# Patient Record
Sex: Male | Born: 1955 | Race: White | Hispanic: No | Smoking: Former smoker
Health system: Southern US, Community
[De-identification: ages and names within clinical notes are randomized; demographics above are authoritative.]

## PROBLEM LIST (undated history)

## (undated) DIAGNOSIS — Z973 Presence of spectacles and contact lenses: Secondary | ICD-10-CM

## (undated) DIAGNOSIS — K7200 Acute and subacute hepatic failure without coma: Secondary | ICD-10-CM

## (undated) DIAGNOSIS — R509 Fever, unspecified: Secondary | ICD-10-CM

## (undated) DIAGNOSIS — M1990 Unspecified osteoarthritis, unspecified site: Secondary | ICD-10-CM

## (undated) DIAGNOSIS — R680 Hypothermia, not associated with low environmental temperature: Secondary | ICD-10-CM

## (undated) DIAGNOSIS — C329 Malignant neoplasm of larynx, unspecified: Secondary | ICD-10-CM

## (undated) DIAGNOSIS — I10 Essential (primary) hypertension: Secondary | ICD-10-CM

## (undated) DIAGNOSIS — G9340 Encephalopathy, unspecified: Secondary | ICD-10-CM

## (undated) DIAGNOSIS — E785 Hyperlipidemia, unspecified: Secondary | ICD-10-CM

## (undated) DIAGNOSIS — I771 Stricture of artery: Secondary | ICD-10-CM

## (undated) DIAGNOSIS — I219 Acute myocardial infarction, unspecified: Secondary | ICD-10-CM

## (undated) DIAGNOSIS — R011 Cardiac murmur, unspecified: Secondary | ICD-10-CM

## (undated) DIAGNOSIS — K222 Esophageal obstruction: Secondary | ICD-10-CM

## (undated) DIAGNOSIS — I6529 Occlusion and stenosis of unspecified carotid artery: Secondary | ICD-10-CM

## (undated) DIAGNOSIS — I739 Peripheral vascular disease, unspecified: Secondary | ICD-10-CM

## (undated) DIAGNOSIS — Z720 Tobacco use: Secondary | ICD-10-CM

## (undated) DIAGNOSIS — I255 Ischemic cardiomyopathy: Secondary | ICD-10-CM

## (undated) DIAGNOSIS — F329 Major depressive disorder, single episode, unspecified: Secondary | ICD-10-CM

## (undated) DIAGNOSIS — K432 Incisional hernia without obstruction or gangrene: Secondary | ICD-10-CM

## (undated) DIAGNOSIS — R42 Dizziness and giddiness: Secondary | ICD-10-CM

## (undated) DIAGNOSIS — Z923 Personal history of irradiation: Secondary | ICD-10-CM

## (undated) DIAGNOSIS — I2510 Atherosclerotic heart disease of native coronary artery without angina pectoris: Secondary | ICD-10-CM

## (undated) DIAGNOSIS — I5043 Acute on chronic combined systolic (congestive) and diastolic (congestive) heart failure: Secondary | ICD-10-CM

## (undated) DIAGNOSIS — R1310 Dysphagia, unspecified: Secondary | ICD-10-CM

## (undated) DIAGNOSIS — IMO0001 Reserved for inherently not codable concepts without codable children: Secondary | ICD-10-CM

## (undated) DIAGNOSIS — R531 Weakness: Secondary | ICD-10-CM

## (undated) DIAGNOSIS — Z9189 Other specified personal risk factors, not elsewhere classified: Secondary | ICD-10-CM

## (undated) DIAGNOSIS — F32A Depression, unspecified: Secondary | ICD-10-CM

## (undated) DIAGNOSIS — F419 Anxiety disorder, unspecified: Secondary | ICD-10-CM

## (undated) DIAGNOSIS — K219 Gastro-esophageal reflux disease without esophagitis: Secondary | ICD-10-CM

## (undated) HISTORY — DX: Peripheral vascular disease, unspecified: I73.9

## (undated) HISTORY — DX: Personal history of irradiation: Z92.3

## (undated) HISTORY — PX: CARDIAC CATHETERIZATION: SHX172

## (undated) HISTORY — PX: COLONOSCOPY: SHX174

## (undated) HISTORY — DX: Malignant neoplasm of larynx, unspecified: C32.9

## (undated) HISTORY — PX: COLOSTOMY: SHX63

## (undated) HISTORY — DX: Tobacco use: Z72.0

## (undated) HISTORY — DX: Acute on chronic combined systolic (congestive) and diastolic (congestive) heart failure: I50.43

## (undated) HISTORY — PX: COLON SURGERY: SHX602

## (undated) HISTORY — PX: PEG TUBE REMOVAL: SHX2187

## (undated) HISTORY — DX: Hyperlipidemia, unspecified: E78.5

---

## 2004-08-19 ENCOUNTER — Inpatient Hospital Stay (HOSPITAL_COMMUNITY): Admission: AD | Admit: 2004-08-19 | Discharge: 2004-08-22 | Payer: Medicaid Other | Admitting: Vascular Surgery

## 2004-08-20 HISTORY — PX: SUBCLAVIAN ANGIOGRAM: SHX5350

## 2004-09-13 HISTORY — PX: CORONARY ARTERY BYPASS GRAFT: SHX141

## 2005-02-17 ENCOUNTER — Encounter: Admission: RE | Admit: 2005-02-17 | Discharge: 2005-02-17 | Payer: Self-pay | Admitting: Family Medicine

## 2008-05-13 HISTORY — PX: OTHER SURGICAL HISTORY: SHX169

## 2008-06-13 HISTORY — PX: NM MYOCAR PERF WALL MOTION: HXRAD629

## 2008-07-19 ENCOUNTER — Encounter: Admission: RE | Admit: 2008-07-19 | Discharge: 2008-07-19 | Payer: Self-pay | Admitting: Cardiovascular Disease

## 2008-07-24 ENCOUNTER — Inpatient Hospital Stay (HOSPITAL_COMMUNITY): Admission: RE | Admit: 2008-07-24 | Discharge: 2008-07-25 | Payer: Self-pay | Admitting: Cardiovascular Disease

## 2008-07-24 HISTORY — PX: ILIAC ARTERY STENT: SHX1786

## 2008-08-13 HISTORY — PX: OTHER SURGICAL HISTORY: SHX169

## 2010-04-21 LAB — BASIC METABOLIC PANEL
BUN: 9 mg/dL (ref 6–23)
CO2: 26 mEq/L (ref 19–32)
Calcium: 8.8 mg/dL (ref 8.4–10.5)
Chloride: 106 mEq/L (ref 96–112)
GFR calc Af Amer: >60 mL/min (ref >60)
Glucose, Bld: 102 mg/dL — ABNORMAL HIGH (ref 70–99)
Sodium: 137 mEq/L (ref 135–145)

## 2010-04-21 LAB — CBC
Hemoglobin: 13.8 g/dL (ref 13.0–17.0)
MCV: 93.7 fL (ref 78.0–100.0)

## 2010-05-28 NOTE — Discharge Summary (Signed)
NAME:  [name], [name] NO.:  [id number]   MEDICAL RECORD NO.:  [id number]          PATIENT TYPE:  INP   LOCATION:  2502                         FACILITY:  [facility]   PHYSICIAN:  Nanetta Batty, M.D.   DATE OF BIRTH:  October 28, 1955   DATE OF ADMISSION:  07/24/2008  DATE OF DISCHARGE:  07/25/2008                               DISCHARGE SUMMARY   DISCHARGE DIAGNOSES:  1. Claudication type pain, right iliac stenting this admission.  2. Widespread vascular disease, patent left subclavian artery stent      this admission, moderate renal artery stenosis bilaterally with 40%      on the left and 30% on the right.  3. Smoking.  4. History of coronary disease, coronary artery bypass grafting x1,      September 2006 at [facility] with low-risk Myoview in June 2010.  5. Treated hypertension.   HOSPITAL COURSE:  The patient is a 55 year old male followed by Dr.  Mariah Milling.  He works Holiday representative at [employer].  He was referred to Dr. Allyson Sabal  for some limb pain which was suspicious for claudication.  He does have  widespread vascular disease and has had previous left subclavian artery  stenting in August 2006 and known occluded left common carotid artery  and moderate right internal carotid artery stenosis.  Nuclear stress  testing in June 2010 was negative for ischemia.  He was admitted for  peripheral angiography.  This was done on July 24, 2008.  Left  subclavian stent was widely patent.  Renal artery showed a 40% left  renal artery narrowing and 30% right renal artery narrowing.  He had a  50% left mid SFA stenosis with three-vessel runoff and 60% ostial right  common iliac artery stenosis and 60% right external iliac artery  stenosis.  He underwent stenting to the right common iliac artery and  PTA to the left external iliac artery.  There was some contained linear  dissection without extravasation of dye.  Plan is for Plavix and aspirin  and followup Dopplers as an outpatient.  The patient  was seen in consult  by tobacco cessation nurse.  Dr. Allyson Sabal saw him in the morning of July 25, 2008 and feels he can be discharged.   LABORATORY DATA:  White count 8.9, hemoglobin 13.8, hematocrit 39.2, and  platelets 260.  Sodium 137, potassium 4.1, BUN 9, and creatinine 1.0.   DISCHARGE MEDICATIONS:  1. Plavix 75 mg a day.  2. Aspirin 325 mg a day.  3. Wellbutrin 300 mg a day.  4. Fosinopril 10 mg a day.  5. Metoprolol 50 mg b.i.d.  6. Amlodipine 10 mg a day.   DISPOSITION:  The patient discharged in stable condition and will have  Dopplers and followup with Dr. Allyson Sabal as an outpatient.      Abelino Derrick, P.A.      Nanetta Batty, M.D.  Electronically Signed    LKK/MEDQ  D:  07/25/2008  T:  07/26/2008  Job:  [id number]

## 2010-05-28 NOTE — Cardiovascular Report (Signed)
NAME:  [name], [name] NO.:  [id number]   MEDICAL RECORD NO.:  [id number]          PATIENT TYPE:  INP   LOCATION:  2502                         FACILITY:  [facility]   PHYSICIAN:  Nanetta Batty, M.D.   DATE OF BIRTH:  01-02-1956   DATE OF PROCEDURE:  DATE OF DISCHARGE:                            CARDIAC CATHETERIZATION   [name] is a 55 year old mildly overweight married Caucasian male,  father of 9 children, who works at Holiday representative at [employer]  He is referred to me by Dr. Mariah Milling for peripheral vascular  evaluation.  He has a history of CAD status post bypass grafting x1 in  September 2006 at [facility].  He does have greater than 100 pack-year history  of tobacco abuse, now only smoking 8 cigarettes a day.  He has  hypertension, hyperlipidemia, and family history as well.  He had a  stent placed in his left subclavian artery in August 2006 with a known  occluded left common carotid artery and moderate right internal color  artery stenosis.  Complains of bilateral lower extremity calf  claudication, right greater than left.  Dopplers have shown renal  vascular disease as well.  He had negative stress test in our office on  June 13, 2008.  He presents now for angiography and potential  intervention.   PROCEDURE DESCRIPTION:  The patient was brought to the second floor of  [facility] in the postabsorptive state.  He was  premedicated with p.o. Valium.  His right and left groins were prepped  and shaved in the usual sterile fashion.  Xylocaine 1% was used for  local anesthesia.  A 5-French sheath was inserted into the left femoral  artery using standard Seldinger technique.  A 5-French tennis racket  catheter was used for arch angiography to delineate his left subclavian  artery stent, midstream distal abdominal aortography with bifemoral  runoff using digital subtraction bolus chase stepping table technique.  A 5-French short  right Judkins catheter was used to obtain selective  right and left renal artery angiography.  Visipaque dye was used for the  entirety of the case.  Retrograde aortic pressures were monitored during  the case.   ANGIOGRAPHIC RESULTS:  1. Arch aortogram.      a.     Left subclavian artery stent was widely patent.  2. Abdominal aortogram.      a.     Renal arteries - 40% left, 30% right, proximal/ostial renal       artery stenosis.      b.     Mild infrarenal abdominal aortic atherosclerotic changes.  3. Left lower extremity.      a.     Mild irregularities in the left common iliac artery.      b.     A 50% segmental mid left SFA stenosis with three-vessel       runoff.  4. Right lower extremity.      a.     A 60-70% ostial/proximal focal and segmental right common       iliac artery stenosis.  The entire right  iliac down to the       internal and external bifurcation was segmentally stenosed in the       60% range.      b.     A 50% segmental mid right SFA with three-vessel runoff.   PROCEDURE DESCRIPTION:  The patient received 2500 units of heparin  intravenously.  Access was obtained in the right common femoral artery  using a 6-French Brite tip 30-cm long sheath.  Predilatation was  performed with a 5.2 PowerFlex at the internal-external distal common  iliac artery junction on the right.  Following this, an 8 x 6 Smart  nitinol self-expanding stent was then deployed under careful  angiographic and fluoroscopic control at the origin of the iliac down  into the external iliac crossing the hypogastric artery.  Post  dilatation was performed of the stent with a 7 x 3 PowerFlex at high  pressures ostially because of what appears to be a fairly resistant  lesion which yielded 8-10 atmospheres and at low pressures throughout  the remainder of the stent into the proximal portion of the external  iliac.  There was a contained dissection within the right common iliac  artery without  extravasation.  The patient tolerated the procedure well.  ACT was measured less than 200.  Both sheaths will be removed.  Pressure  will be held on both groins to achieve hemostasis.  Completion  angiography was done using a pigtail catheter in multiple angiographic  views confirming that there was no extravasation or extension of the  dissection into the aorta or beyond.  Plan will be to gently hydrate the  patient, treat him with aspirin and Plavix.  He will be discharged home  in the morning, and we will get followup Dopplers and ABIs.  I will see  him back in the office in 1 or 2 weeks for followup.      Nanetta Batty, M.D.  Electronically Signed     JB/MEDQ  D:  07/24/2008  T:  07/25/2008  Job:  [id number]   cc:   Second Floor [facility].  [facility].  Antonieta Iba, MD  Jerel Shepherd, MD  Quita Skye. Artis Flock, M.D.

## 2010-05-31 NOTE — Consult Note (Signed)
[name], [name]              ACCOUNT NO.:  [id number]   MEDICAL RECORD NO.:  [id number]          PATIENT TYPE:  INP   LOCATION:  5727                         FACILITY:  [facility]   PHYSICIAN:  Sanjeev K. Deveshwar, M.D.DATE OF BIRTH:  07-23-1955   DATE OF CONSULTATION:  DATE OF DISCHARGE:                                   CONSULTATION   ADDENDUM:   PHYSICAL EXAMINATION:  GENERAL:  Exam reveals a pleasant 55 year old white  male in no acute distress.  VITAL SIGNS:  Blood pressure 90/65, pulse 80, respirations 20, temperature  97.8.  HEENT:  Unremarkable.  NECK:  Right carotid bruit.  LUNGS:  Clear but decreased.  HEART:  Regular rate and rhythm with distant heart sounds.  No murmurs  appreciated.  ABDOMEN:  Soft, nontender, with positive bowel sounds.  EXTREMITIES:  No edema.  Pulses are intact.  SKIN:  Warm and dry.  NEUROLOGIC:  Mental status:  The patient is alert and oriented and follows  commands.  Cranial nerves II-XII are grossly intact.  He reports no visual  problems at this time.  Sensation is intact to light touch.  Motor strength  is 5/5 throughout.  Cerebellar testing is intact.      Markus.Osmond   DR/MEDQ  D:  08/20/2004  T:  08/20/2004  Job:  [id number]

## 2010-05-31 NOTE — Consult Note (Signed)
[name], [name]              ACCOUNT NO.:  [id number]   MEDICAL RECORD NO.:  [id number]          PATIENT TYPE:  INP   LOCATION:  5727                         FACILITY:  [facility]   PHYSICIAN:  Sanjeev K. Deveshwar, M.D.DATE OF BIRTH:  [birth year]   DATE OF CONSULTATION:  08/20/2004  DATE OF DISCHARGE:                                   CONSULTATION   BRIEF HISTORY:  We were asked to see this 55 year old male admitted to [facility] on August 19, 2004 by Dr. Hart Rochester with a six to eight-week  history of intermittent loss of vision and blurred vision in his left eye.  The patient denies any other neurological symptoms.  He was seen and  evaluated by Dr. Hart Rochester.  He was admitted to [facility] for further  evaluation.   The patient had Dopplers performed yesterday.  He was found to have a  calcific plaque in the proximal right internal carotid artery which was felt  to be moderate in severity.  The left had a severe thrombus versus plaque in  a proximal common carotid artery through the external carotid artery and  internal carotid artery.  He also had a 60-80% stenosis of the right  internal carotid artery.  He had an occluded left common carotid artery and  greater than 80% internal carotid artery stenosis with severe external  carotid artery stenosis.  He had bilateral vertebral artery flow antegrade.  It was felt that the high right vertebral velocities may be compensatory  flow.   Dr. Hart Rochester asked Dr. Corliss Skains to see the patient to treat a left subclavian  stenosis versus occlusion prior to the patient undergoing carotid  endarterectomy.  The patient is scheduled for an angiogram today with  possible PTA stenting of the left subclavian.   PAST MEDICAL HISTORY:  The patient has a history of hyperlipidemia.  He  denies hypertension.  He denies diabetes.  He denies any type of cardiac  history.  He states he has otherwise been healthy.   PAST SURGICAL HISTORY:  He  has had no previous surgeries.   ALLERGIES:  No known drug allergies.  He denies allergy to contrast dye or  to latex.   MEDICATIONS:  He was on no medications prior to admission.  Currently, is on  aspirin 325 mg daily.  He was on IV heparin until this morning.  He is also  on nicotine patches.   SOCIAL HISTORY:  The patient is married.  He lives in [place].  He has  seven children.  He smoked two packs per day for at least 30 years.  He  drinks alcohol occasionally.  He works Holiday representative.   FAMILY HISTORY:  His mother died age 70 from an MI.  His father died age 50  from cancer.   REVIEW OF SYSTEMS:  Completely negative except for a recent cough and the  intermittent visual problems as noted above.   LABORATORY DATA:  Please see the results of the Dopplers as noted above.  An  INR of 0.9, a PTT was 33.  CBC revealed hemoglobin 15.2, hematocrit  44.2,  WBC 12.6, platelets 364,000.  BUN 10, creatinine 1.2, potassium 3.3, sodium  134, glucose 183.   IMPRESSION:  1. Transient ischemic attack symptoms consisting of intermittent loss of      vision in the left eye.  2. Severe extracranial vascular disease per Doppler examination as noted      above.  3. History of hyperlipidemia.  4. Long history of tobacco use with probable chronic obstructive pulmonary      disease.  5. Hypokalemia.  6. Elevated glucose levels.  7. Leukocytosis.   PLAN:  As noted, the patient will have cerebral angiogram performed today  with possible PTA stenting of the left subclavian artery with plans for the  patient to eventually undergo an internal carotid artery bypass if feasible.      Markus.Osmond   DR/MEDQ  D:  08/20/2004  T:  08/20/2004  Job:  [id number]   cc:   Quita Skye. Artis Flock, M.D.  [address]  [place]  [place] [address]  Fax: [phone]

## 2010-05-31 NOTE — Discharge Summary (Signed)
[name], [name]              ACCOUNT NO.:  [id number]   MEDICAL RECORD NO.:  [id number]          PATIENT TYPE:  INP   LOCATION:  2001                         FACILITY:  [facility]   PHYSICIAN:  Di Kindle. Edilia Bo, M.D.DATE OF BIRTH:  Jan 20, 1955   DATE OF ADMISSION:  08/19/2004  DATE OF DISCHARGE:  08/22/2004                                 DISCHARGE SUMMARY   PRIMARY ADMITTING DIAGNOSES:  1.  Left eye amaurosis fugax.  2.  Left carotid occlusion.   DISCHARGE DIAGNOSES:  1.  Left eye amaurosis fugax.  2.  Left carotid occlusion.  3.  Left subclavian artery stenosis.  4.  Moderate right internal carotid stenosis.  5.  History of tobacco abuse.   PROCEDURES PERFORMED:  1.  Arch aortogram and catheter angiography.  2.  Left subclavian artery PTA and stent.   HISTORY:  The patient is a 55 year old male who over the past three months  has suffered multiple episodes of visual loss in his left eye consistent  with amaurosis fugax. He has had no other neurologic symptoms. He was  referred to Dr. Josephina Gip for further evaluation and a carotid duplex  study was performed in our office which revealed an occluded left common  carotid artery, severe left subclavian disease with 30 mm gradient on the  right arm higher than the left, bilateral antegrade flow in the vertebral  arteries with right much greater than the left, and moderate right internal  carotid artery stenosis approximating 70%. His most recent episode occurred  on the date of this admission. Because of this, Dr. Hart Rochester recommended  admitting him to [facility] and starting him on IV heparin  and proceeding with cerebral angiography with an eye toward subclavian  stenting and possibly left subclavian to carotid bypass.   HOSPITAL COURSE:  The patient was admitted on August 19, 2004. He was started  on heparin and was seen by interventional radiology. On August 20, 2004, he  underwent an arch aortogram  and catheter angiography which showed occlusion  of the left common carotid artery with a long segment of severe left  subclavian artery stenosis which is proximal to the left vertebral artery  origin. There is also a left subclavian steel and around a 50% stenosis  noted at the origin of left vertebral artery. Angioplasty and stent was  performed on the left subclavian artery and the patient was given IV ReoPro  slow flow at the origin of the vertebral artery. He tolerated the procedure  well and was returned to the ICU in stable condition.   Postprocedure, he has done well. He has remained neurologically intact and  sheath has now been removed. He was seen by Dr. Waverly Ferrari for  consideration of left carotid to subclavian to carotid bypass. Dr. Edilia Bo  reviewed his angiography. Because of the left subclavian stent placement,  the patient now has antegrade flow of the left vertebral and it is felt that  his perfusion to the left internal carotid artery should be improved. It was  very difficult to actually see the left internal carotid artery  on the  angiography to know if it was really bypassable. Dr. Edilia Bo felt that  because there was antegrade flow into the left vertebral which supplied  collaterals to the internal carotid, he should have improved perfusion to  this area. It was his recommendation to delay proceeding with a subclavian  to internal carotid bypass unless he had further symptoms. It was also noted  he had excellent cross filling from the right internal carotid artery. He  has remained stable and has been started on aspirin and Plavix. He has had  no further visual symptoms. He has been afebrile and all vital signs were  stable. He has slowly been mobilized following the removal of his sheath.  Because of his ongoing tobacco use, a tobacco cessation consult has been  ordered and will be completed prior to discharge. He is stable now and will  be transferred out  to the floor today. It is felt that if he remained stable  over the next 24-48 hours, he will hopefully be able to be discharged home  on aspirin and Plavix and will be followed up as an outpatient.   DISCHARGE MEDICATIONS:  1.  Aspirin 325 milligrams q.d.  2.  Plavix 75 milligrams q.d.  3.  Lortab 5/500 one to two q.4h. p.r.n. for pain.   DISCHARGE INSTRUCTIONS:  He is asked to refrain from driving for the next  week. He is asked to ambulate daily. He may shower. He will continue same  preoperative diet.   DISCHARGE FOLLOWUP:  Dr. Edilia Bo will see the patient back on October 02, 2004 at the 9:20 a.m. If he has symptoms prior to this, he is asked to  contact our office immediately.       GC/MEDQ  D:  08/21/2004  T:  08/21/2004  Job:  [id number]   cc:   Sanjeev K. Corliss Skains, M.D.  [address], [address]  [place]  [place] [address]  Fax: [phone]   Quita Skye. Artis Flock, M.D.  [address], [address]  [place]  [place] [address]  Fax: [phone]

## 2010-05-31 NOTE — H&P (Signed)
NAMESUNG, [name]             ACCOUNT NO.:  [id number]   MEDICAL RECORD NO.:  [id number]          PATIENT TYPE:  INP   LOCATION:                               FACILITY:  [facility]   PHYSICIAN:  Quita Skye. Hart Rochester, M.D.  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   HISTORY OF PRESENT ILLNESS:  This 55 year old gentleman has had multiple  episodes of loss of vision in the left eye over the last three months, the  most recent of which was today.  This is blurred vision at times and other  times total darkness.  He has had no episodes of hemiparesis, aphasia,  syncope, or other neurologic symptoms.  He has no history of CVA in the  past.   PAST MEDICAL HISTORY:  Negative for coronary artery disease, myocardial  infarction, hypertension, diabetes, deep venous thrombosis, or pulmonary  emboli.   ALLERGIES:  None.   MEDICATIONS:  None.   PAST SURGICAL HISTORY:  None.   SOCIAL HISTORY:  Smokes one and a half packs of cigarettes a day for 36  years.   FAMILY HISTORY:  Positive for stroke in a mother and a sister.  Negative for  diabetes or coronary artery disease.   PHYSICAL EXAMINATION:  VITAL SIGNS:  Blood pressure 165/70, heart rate 70,  respirations 14.  GENERAL:  He is a middle-aged male in no apparent distress.  He is alert and  oriented x3.  NECK:  Supple with 3+ carotid pulses.  No bruits are audible.  NEUROLOGIC:  Normal.  There is no palpable adenopathy in the neck.  PULSES:  Upper extremity pulses are 3+ on the right and 1+ on the left at  the brachial and radial level.  CHEST:  Clear to auscultation.  ABDOMEN:  Soft, nontender with no masses.  EXTREMITIES:  3+ femoral, 2+ popliteal, and 2+ dorsalis pedis pulses  palpable bilaterally.   Carotid duplex examination revealed occluded left common  carotid artery,  severe left subclavian disease with 30 mm gradient right arm higher than the  left, bilateral antegrade flow in the vertebral arteries with the right much  better than the left, moderate right internal carotid artery stenosis  approximating 70%.   IMPRESSION:  1.  Amaurosis fugax left eye secondary to left carotid occlusion.  It does      appear that the internal and external carotid arteries are patent,      although the left common carotid is totally occluded.  2.  Left subclavian occlusive disease with diminished flow left vertebral      artery.  3.  Moderate right internal carotid occlusive disease.  4.  History of tobacco abuse.   PLAN:  Admit the patient for IV heparinization and plan cerebral angiography  tomorrow per Dr. Corliss Skains.  If he is a candidate for subclavian stenting  that will be performed and then possibly a left subclavian carotid bypass  could be performed  to revascularize the left side.           ______________________________  Quita Skye Hart Rochester, M.D.     JDL/MEDQ  D:  08/19/2004  T:  08/19/2004  Job:  [id number]   cc:   Quita Skye. Artis Flock, M.D.  [address]  [place]  [place] [address]  Fax: [phone]

## 2013-02-03 ENCOUNTER — Encounter: Payer: Self-pay | Admitting: Physician Assistant

## 2013-02-03 NOTE — Progress Notes (Signed)
This encounter was created in error - please disregard.

## 2013-05-13 HISTORY — PX: PERIPHERAL VASCULAR CATHETERIZATION: SHX172C

## 2013-06-09 ENCOUNTER — Emergency Department (HOSPITAL_COMMUNITY): Payer: Medicaid Other

## 2013-06-09 ENCOUNTER — Inpatient Hospital Stay (HOSPITAL_COMMUNITY)
Admission: EM | Admit: 2013-06-09 | Discharge: 2013-06-20 | DRG: 280 | Disposition: A | Discharge status: Home Health | Payer: Medicaid Other | Attending: Cardiovascular Disease | Admitting: Cardiovascular Disease

## 2013-06-09 ENCOUNTER — Encounter (HOSPITAL_COMMUNITY): Payer: Self-pay | Admitting: Emergency Medicine

## 2013-06-09 DIAGNOSIS — Z91199 Patient's noncompliance with other medical treatment and regimen due to unspecified reason: Secondary | ICD-10-CM

## 2013-06-09 DIAGNOSIS — I4729 Other ventricular tachycardia: Secondary | ICD-10-CM | POA: Diagnosis not present

## 2013-06-09 DIAGNOSIS — J9600 Acute respiratory failure, unspecified whether with hypoxia or hypercapnia: Secondary | ICD-10-CM | POA: Diagnosis present

## 2013-06-09 DIAGNOSIS — Z9861 Coronary angioplasty status: Secondary | ICD-10-CM

## 2013-06-09 DIAGNOSIS — G9349 Other encephalopathy: Secondary | ICD-10-CM | POA: Diagnosis present

## 2013-06-09 DIAGNOSIS — Z9189 Other specified personal risk factors, not elsewhere classified: Secondary | ICD-10-CM | POA: Diagnosis present

## 2013-06-09 DIAGNOSIS — I2584 Coronary atherosclerosis due to calcified coronary lesion: Secondary | ICD-10-CM | POA: Diagnosis present

## 2013-06-09 DIAGNOSIS — Z7982 Long term (current) use of aspirin: Secondary | ICD-10-CM | POA: Diagnosis not present

## 2013-06-09 DIAGNOSIS — E669 Obesity, unspecified: Secondary | ICD-10-CM | POA: Diagnosis present

## 2013-06-09 DIAGNOSIS — I4901 Ventricular fibrillation: Secondary | ICD-10-CM | POA: Diagnosis present

## 2013-06-09 DIAGNOSIS — I2510 Atherosclerotic heart disease of native coronary artery without angina pectoris: Secondary | ICD-10-CM

## 2013-06-09 DIAGNOSIS — F1020 Alcohol dependence, uncomplicated: Secondary | ICD-10-CM | POA: Diagnosis present

## 2013-06-09 DIAGNOSIS — F05 Delirium due to known physiological condition: Secondary | ICD-10-CM | POA: Diagnosis not present

## 2013-06-09 DIAGNOSIS — I219 Acute myocardial infarction, unspecified: Secondary | ICD-10-CM | POA: Diagnosis present

## 2013-06-09 DIAGNOSIS — I771 Stricture of artery: Secondary | ICD-10-CM | POA: Diagnosis present

## 2013-06-09 DIAGNOSIS — IMO0002 Reserved for concepts with insufficient information to code with codable children: Secondary | ICD-10-CM | POA: Diagnosis not present

## 2013-06-09 DIAGNOSIS — I214 Non-ST elevation (NSTEMI) myocardial infarction: Secondary | ICD-10-CM | POA: Diagnosis present

## 2013-06-09 DIAGNOSIS — Z9119 Patient's noncompliance with other medical treatment and regimen: Secondary | ICD-10-CM | POA: Diagnosis not present

## 2013-06-09 DIAGNOSIS — I5043 Acute on chronic combined systolic (congestive) and diastolic (congestive) heart failure: Secondary | ICD-10-CM | POA: Diagnosis present

## 2013-06-09 DIAGNOSIS — Z598 Other problems related to housing and economic circumstances: Secondary | ICD-10-CM

## 2013-06-09 DIAGNOSIS — R112 Nausea with vomiting, unspecified: Secondary | ICD-10-CM | POA: Diagnosis not present

## 2013-06-09 DIAGNOSIS — Z79899 Other long term (current) drug therapy: Secondary | ICD-10-CM | POA: Diagnosis not present

## 2013-06-09 DIAGNOSIS — K7200 Acute and subacute hepatic failure without coma: Secondary | ICD-10-CM | POA: Diagnosis not present

## 2013-06-09 DIAGNOSIS — R0989 Other specified symptoms and signs involving the circulatory and respiratory systems: Secondary | ICD-10-CM | POA: Diagnosis present

## 2013-06-09 DIAGNOSIS — J690 Pneumonitis due to inhalation of food and vomit: Secondary | ICD-10-CM | POA: Diagnosis present

## 2013-06-09 DIAGNOSIS — E872 Acidosis, unspecified: Secondary | ICD-10-CM | POA: Diagnosis present

## 2013-06-09 DIAGNOSIS — G3184 Mild cognitive impairment, so stated: Secondary | ICD-10-CM | POA: Diagnosis present

## 2013-06-09 DIAGNOSIS — F17200 Nicotine dependence, unspecified, uncomplicated: Secondary | ICD-10-CM | POA: Diagnosis present

## 2013-06-09 DIAGNOSIS — I2109 ST elevation (STEMI) myocardial infarction involving other coronary artery of anterior wall: Secondary | ICD-10-CM | POA: Diagnosis present

## 2013-06-09 DIAGNOSIS — F10231 Alcohol dependence with withdrawal delirium: Secondary | ICD-10-CM | POA: Diagnosis not present

## 2013-06-09 DIAGNOSIS — I2589 Other forms of chronic ischemic heart disease: Secondary | ICD-10-CM | POA: Diagnosis present

## 2013-06-09 DIAGNOSIS — R509 Fever, unspecified: Secondary | ICD-10-CM

## 2013-06-09 DIAGNOSIS — I509 Heart failure, unspecified: Secondary | ICD-10-CM | POA: Diagnosis present

## 2013-06-09 DIAGNOSIS — I469 Cardiac arrest, cause unspecified: Secondary | ICD-10-CM | POA: Diagnosis present

## 2013-06-09 DIAGNOSIS — I701 Atherosclerosis of renal artery: Secondary | ICD-10-CM | POA: Diagnosis present

## 2013-06-09 DIAGNOSIS — N179 Acute kidney failure, unspecified: Secondary | ICD-10-CM | POA: Diagnosis present

## 2013-06-09 DIAGNOSIS — R42 Dizziness and giddiness: Secondary | ICD-10-CM | POA: Diagnosis not present

## 2013-06-09 DIAGNOSIS — Z6826 Body mass index (BMI) 26.0-26.9, adult: Secondary | ICD-10-CM

## 2013-06-09 DIAGNOSIS — R531 Weakness: Secondary | ICD-10-CM | POA: Diagnosis present

## 2013-06-09 DIAGNOSIS — I2582 Chronic total occlusion of coronary artery: Secondary | ICD-10-CM | POA: Diagnosis present

## 2013-06-09 DIAGNOSIS — I252 Old myocardial infarction: Secondary | ICD-10-CM

## 2013-06-09 DIAGNOSIS — G9340 Encephalopathy, unspecified: Secondary | ICD-10-CM | POA: Diagnosis present

## 2013-06-09 DIAGNOSIS — I501 Left ventricular failure: Secondary | ICD-10-CM | POA: Diagnosis present

## 2013-06-09 DIAGNOSIS — I472 Ventricular tachycardia, unspecified: Secondary | ICD-10-CM | POA: Diagnosis not present

## 2013-06-09 DIAGNOSIS — F10931 Alcohol use, unspecified with withdrawal delirium: Secondary | ICD-10-CM | POA: Diagnosis not present

## 2013-06-09 DIAGNOSIS — J9601 Acute respiratory failure with hypoxia: Secondary | ICD-10-CM | POA: Diagnosis present

## 2013-06-09 DIAGNOSIS — Z7902 Long term (current) use of antithrombotics/antiplatelets: Secondary | ICD-10-CM

## 2013-06-09 DIAGNOSIS — Z5987 Material hardship due to limited financial resources, not elsewhere classified: Secondary | ICD-10-CM

## 2013-06-09 DIAGNOSIS — R680 Hypothermia, not associated with low environmental temperature: Secondary | ICD-10-CM | POA: Diagnosis present

## 2013-06-09 DIAGNOSIS — E162 Hypoglycemia, unspecified: Secondary | ICD-10-CM | POA: Diagnosis not present

## 2013-06-09 DIAGNOSIS — Z8674 Personal history of sudden cardiac arrest: Secondary | ICD-10-CM | POA: Diagnosis present

## 2013-06-09 DIAGNOSIS — Z951 Presence of aortocoronary bypass graft: Secondary | ICD-10-CM | POA: Diagnosis not present

## 2013-06-09 DIAGNOSIS — R570 Cardiogenic shock: Secondary | ICD-10-CM

## 2013-06-09 DIAGNOSIS — D649 Anemia, unspecified: Secondary | ICD-10-CM | POA: Diagnosis present

## 2013-06-09 DIAGNOSIS — I255 Ischemic cardiomyopathy: Secondary | ICD-10-CM

## 2013-06-09 DIAGNOSIS — D638 Anemia in other chronic diseases classified elsewhere: Secondary | ICD-10-CM | POA: Diagnosis present

## 2013-06-09 HISTORY — DX: Other specified personal risk factors, not elsewhere classified: Z91.89

## 2013-06-09 HISTORY — DX: Hypothermia, not associated with low environmental temperature: R68.0

## 2013-06-09 HISTORY — DX: Acute and subacute hepatic failure without coma: K72.00

## 2013-06-09 HISTORY — DX: Occlusion and stenosis of unspecified carotid artery: I65.29

## 2013-06-09 HISTORY — DX: Fever, unspecified: R50.9

## 2013-06-09 HISTORY — DX: Ischemic cardiomyopathy: I25.5

## 2013-06-09 HISTORY — DX: Stricture of artery: I77.1

## 2013-06-09 HISTORY — DX: Dizziness and giddiness: R42

## 2013-06-09 HISTORY — DX: Hyperlipidemia, unspecified: E78.5

## 2013-06-09 HISTORY — DX: Encephalopathy, unspecified: G93.40

## 2013-06-09 HISTORY — DX: Essential (primary) hypertension: I10

## 2013-06-09 HISTORY — DX: Weakness: R53.1

## 2013-06-09 HISTORY — DX: Acute myocardial infarction, unspecified: I21.9

## 2013-06-09 HISTORY — DX: Atherosclerotic heart disease of native coronary artery without angina pectoris: I25.10

## 2013-06-09 LAB — I-STAT ARTERIAL BLOOD GAS, ED
Acid-base deficit: 13 mmol/L — ABNORMAL HIGH (ref 0.0–2.0)
BICARBONATE: 16.7 meq/L — AB (ref 20.0–24.0)
O2 Saturation: 77 %
PH ART: 7.076 — AB (ref 7.350–7.450)
Patient temperature: 98.6
TCO2: 18 mmol/L (ref 0–100)
pCO2 arterial: 56.8 mmHg — ABNORMAL HIGH (ref 35.0–45.0)
pO2, Arterial: 58 mmHg — ABNORMAL LOW (ref 80.0–100.0)

## 2013-06-09 LAB — I-STAT VENOUS BLOOD GAS, ED
Acid-base deficit: 9 mmol/L — ABNORMAL HIGH (ref 0.0–2.0)
Bicarbonate: 20.2 mEq/L (ref 20.0–24.0)
O2 SAT: 39 %
PCO2 VEN: 60.5 mmHg — AB (ref 45.0–50.0)
PH VEN: 7.133 — AB (ref 7.250–7.300)
TCO2: 22 mmol/L (ref 0–100)
pO2, Ven: 30 mmHg (ref 30.0–45.0)

## 2013-06-09 LAB — PROTIME-INR
INR: 1.22 (ref 0.00–1.49)
Prothrombin Time: 15.1 seconds (ref 11.6–15.2)

## 2013-06-09 LAB — I-STAT CHEM 8, ED
BUN: 19 mg/dL (ref 6–23)
CHLORIDE: 104 meq/L (ref 96–112)
CREATININE: 1.5 mg/dL — AB (ref 0.50–1.35)
Calcium, Ion: 1.08 mmol/L — ABNORMAL LOW (ref 1.13–1.30)
GLUCOSE: 168 mg/dL — AB (ref 70–99)
HCT: 40 % (ref 39.0–52.0)
HEMOGLOBIN: 13.6 g/dL (ref 13.0–17.0)
POTASSIUM: 3.4 meq/L — AB (ref 3.7–5.3)
Sodium: 144 mEq/L (ref 137–147)
TCO2: 19 mmol/L (ref 0–100)

## 2013-06-09 LAB — I-STAT TROPONIN, ED: Troponin i, poc: 0.26 ng/mL (ref 0.00–0.08)

## 2013-06-09 LAB — I-STAT CG4 LACTIC ACID, ED: Lactic Acid, Venous: 7.64 mmol/L — ABNORMAL HIGH (ref 0.5–2.2)

## 2013-06-09 LAB — CBG MONITORING, ED: Glucose-Capillary: 169 mg/dL — ABNORMAL HIGH (ref 70–99)

## 2013-06-09 LAB — BASIC METABOLIC PANEL
BUN: 17 mg/dL (ref 6–23)
CO2: 16 meq/L — AB (ref 19–32)
Calcium: 7.4 mg/dL — ABNORMAL LOW (ref 8.4–10.5)
Chloride: 105 mEq/L (ref 96–112)
Creatinine, Ser: 1.41 mg/dL — ABNORMAL HIGH (ref 0.50–1.35)
GFR calc non Af Amer: 53 mL/min — ABNORMAL LOW (ref >90)
GFR, EST AFRICAN AMERICAN: 61 mL/min — AB (ref >90)
GLUCOSE: 173 mg/dL — AB (ref 70–99)
POTASSIUM: 3.6 meq/L — AB (ref 3.7–5.3)
Sodium: 141 mEq/L (ref 137–147)

## 2013-06-09 LAB — CBC
HEMATOCRIT: 36.3 % — AB (ref 39.0–52.0)
HEMOGLOBIN: 12 g/dL — AB (ref 13.0–17.0)
MCH: 31.9 pg (ref 26.0–34.0)
MCHC: 33.1 g/dL (ref 30.0–36.0)
MCV: 96.5 fL (ref 78.0–100.0)
Platelets: 296 10*3/uL (ref 150–400)
RBC: 3.76 MIL/uL — AB (ref 4.22–5.81)
RDW: 14.5 % (ref 11.5–15.5)
WBC: 17.1 10*3/uL — AB (ref 4.0–10.5)

## 2013-06-09 LAB — APTT: aPTT: 39 seconds — ABNORMAL HIGH (ref 24–37)

## 2013-06-09 MED ORDER — SODIUM CHLORIDE 0.9 % IV SOLN: 250.0000 mL | INTRAVENOUS | Status: DC | PRN

## 2013-06-09 MED ORDER — EPINEPHRINE HCL 0.1 MG/ML IJ SOSY
PREFILLED_SYRINGE | INTRAMUSCULAR | Status: DC | PRN
Start: 2013-06-09 — End: 2013-06-10
  Administered 2013-06-09 (×2): 1 mg via INTRAVENOUS

## 2013-06-09 MED ORDER — PANTOPRAZOLE SODIUM 40 MG IV SOLR
40.0000 mg | Freq: Every day | INTRAVENOUS | Status: DC
  Administered 2013-06-10 – 2013-06-16 (×7): 40 mg via INTRAVENOUS
  Filled 2013-06-09 (×11): qty 40

## 2013-06-09 MED ORDER — CISATRACURIUM BOLUS VIA INFUSION
0.1000 mg/kg | Freq: Once | INTRAVENOUS | Status: DC
  Filled 2013-06-09: qty 9

## 2013-06-09 MED ORDER — MIDAZOLAM HCL 2 MG/2ML IJ SOLN
2.0000 mg | Freq: Once | INTRAMUSCULAR | Status: AC
  Administered 2013-06-09: 2 mg via INTRAVENOUS

## 2013-06-09 MED ORDER — ASPIRIN 300 MG RE SUPP: 300.0000 mg | RECTAL | Status: DC

## 2013-06-09 MED ORDER — MIDAZOLAM BOLUS VIA INFUSION: 2.0000 mg | Freq: Once | INTRAVENOUS | Status: DC

## 2013-06-09 MED ORDER — SODIUM BICARBONATE 8.4 % IV SOLN
INTRAVENOUS | Status: DC | PRN
  Administered 2013-06-09: 50 meq via INTRAVENOUS

## 2013-06-09 MED ORDER — DEXTROSE 5 % IV SOLN
300.0000 mg | INTRAVENOUS | Status: AC | PRN
  Administered 2013-06-09: 300 mg via INTRAVENOUS

## 2013-06-09 MED ORDER — CISATRACURIUM BOLUS VIA INFUSION
0.0500 mg/kg | INTRAVENOUS | Status: AC | PRN
  Administered 2013-06-10: 8 mg via INTRAVENOUS
  Filled 2013-06-09: qty 5

## 2013-06-09 MED ORDER — SODIUM CHLORIDE 0.9 % IV SOLN
1.0000 mg/h | INTRAVENOUS | Status: DC
  Administered 2013-06-10: 2 mg/h via INTRAVENOUS
  Filled 2013-06-09: qty 10

## 2013-06-09 MED ORDER — PROPOFOL 10 MG/ML IV EMUL
5.0000 ug/kg/min | INTRAVENOUS | Status: DC
Start: 2013-06-09 — End: 2013-06-10

## 2013-06-09 MED ORDER — LORAZEPAM 2 MG/ML IJ SOLN
1.0000 mg | Freq: Once | INTRAMUSCULAR | Status: DC
  Filled 2013-06-09: qty 1

## 2013-06-09 MED ORDER — NOREPINEPHRINE BITARTRATE 1 MG/ML IV SOLN
0.5000 ug/min | INTRAVENOUS | Status: DC
  Administered 2013-06-09: 10 ug/min via INTRAVENOUS
  Filled 2013-06-09: qty 4

## 2013-06-09 MED ORDER — FENTANYL CITRATE 0.05 MG/ML IJ SOLN
50.0000 ug | Freq: Once | INTRAMUSCULAR | Status: AC
  Administered 2013-06-09: 50 ug via INTRAVENOUS

## 2013-06-09 MED ORDER — FENTANYL CITRATE 0.05 MG/ML IJ SOLN: 100.0000 ug | Freq: Once | INTRAMUSCULAR | Status: DC

## 2013-06-09 MED ORDER — SODIUM CHLORIDE 0.9 % IV SOLN
INTRAVENOUS | Status: AC | PRN
  Administered 2013-06-09: 1000 mL via INTRAVENOUS

## 2013-06-09 MED ORDER — SODIUM CHLORIDE 0.9 % IV SOLN
25.0000 ug/h | INTRAVENOUS | Status: DC
  Administered 2013-06-09: 50 ug/h via INTRAVENOUS
  Filled 2013-06-09: qty 50

## 2013-06-09 MED ORDER — HEPARIN SODIUM (PORCINE) 5000 UNIT/ML IJ SOLN: 5000.0000 [IU] | Freq: Three times a day (TID) | INTRAMUSCULAR | Status: DC

## 2013-06-09 MED ORDER — FENTANYL CITRATE 0.05 MG/ML IJ SOLN
50.0000 ug | Freq: Once | INTRAMUSCULAR | Status: DC
  Filled 2013-06-09: qty 2

## 2013-06-09 MED ORDER — NOREPINEPHRINE BITARTRATE 1 MG/ML IV SOLN: 0.5000 ug/min | INTRAVENOUS | Status: DC

## 2013-06-09 MED ORDER — FENTANYL BOLUS VIA INFUSION
50.0000 ug | INTRAVENOUS | Status: DC | PRN
  Filled 2013-06-09: qty 50

## 2013-06-09 MED ORDER — MIDAZOLAM HCL 2 MG/2ML IJ SOLN
INTRAMUSCULAR | Status: AC
  Filled 2013-06-09: qty 2

## 2013-06-09 MED ORDER — FENTANYL CITRATE 0.05 MG/ML IJ SOLN: 100.0000 ug | Freq: Once | INTRAMUSCULAR | Status: AC | PRN

## 2013-06-09 MED ORDER — SODIUM CHLORIDE 0.9 % IV SOLN
2000.0000 mL | Freq: Once | INTRAVENOUS | Status: AC
  Administered 2013-06-09: 2000 mL via INTRAVENOUS

## 2013-06-09 MED ORDER — LORAZEPAM 2 MG/ML IJ SOLN
1.0000 mg | Freq: Once | INTRAMUSCULAR | Status: AC
  Administered 2013-06-09: 1 mg via INTRAVENOUS

## 2013-06-09 MED ORDER — SODIUM CHLORIDE 0.9 % IV SOLN
1.0000 ug/kg/min | INTRAVENOUS | Status: DC
  Filled 2013-06-09: qty 20

## 2013-06-09 NOTE — H&P (Signed)
PULMONARY / CRITICAL CARE MEDICINE   Name: [name] MRN: [id number] DOB: May 10, 1955    ADMISSION DATE:  06/09/2013 CONSULTATION DATE:  06/09/2013  REFERRING MD :  EDP PRIMARY SERVICE: PCCM  CHIEF COMPLAINT:  arrest  BRIEF PATIENT DESCRIPTION:  58 year old male with significant cardiac history. Presented to [facility] ED 5/28 after collapse and PEA arrest at home. ROSC via ems with approx 15-20 mins downtime. ACLS for 15 mins. In ED requiring significant ventilatory and vasoactive assistance.   SIGNIFICANT EVENTS / STUDIES:  5/19 - MI with PCI at [facility] 5/28 - PEA arrest   LINES / TUBES: OETT 5/29 >>> L subclavian CVL 5/29 >>> Foley 5/29 >>>  CULTURES: Blood 5/29 >>> Urine 5/29 >>>  ANTIBIOTICS: none  HISTORY OF PRESENT ILLNESS:  58 year old male with significant cardiac history.MI 2006, 01/2013, 05/2013 all at [facility]. Received stents each time. Reportedly takes multiple medications for BP and blood thinners at home. 2/28 was at home in [facility] when he collapsed in bathroom. Family called EMS right away. Arrived in 10 minutes to find PEA arrest. ROSC with 15 mins CPR and epix3. Intubated. In ED coded 2 additional times, ROSC with 1-2 minutes of ACLS each time. Was agitated post arrest with purposeful movements and reaching for ETT so started on sedation. On vent he has been at 100% with increased PEEP and has not been able to maintain O2 sats greater than 80%.   PAST MEDICAL HISTORY :  Past Medical History  Diagnosis Date  . Acute MI    Past Surgical History  Procedure Laterality Date  . Angioplasty     Prior to Admission medications   Not on File   Allergies not on file  FAMILY HISTORY:  No family history on file. SOCIAL HISTORY:  has no tobacco, alcohol, and drug history on file.  REVIEW OF SYSTEMS: unable, intubated  SUBJECTIVE:   VITAL SIGNS: Resp:  [22-27] 23 (05/28 2314) BP: (94-106)/(63-75) 94/67 mmHg (05/28 2314) FiO2 (%):  [100 %] 100 % (05/28 2231) Weight:   [81.647 kg (180 lb)] 81.647 kg (180 lb) (05/28 2214) HEMODYNAMICS:   VENTILATOR SETTINGS: Vent Mode:  [-] PRVC FiO2 (%):  [100 %] 100 % Set Rate:  [25 bmp] 25 bmp Vt Set:  [600 mL] 600 mL PEEP:  [5 cmH20] 5 cmH20 Plateau Pressure:  [31 cmH20] 31 cmH20 INTAKE / OUTPUT: Intake/Output   None     PHYSICAL EXAMINATION: General:  Overweight male, intubated, in moderate acute distress Neuro:  Agitated, no eye opening, does not follow commands.  HEENT:  [place]/AT, pupils pinpoint, equal.  Cardiovascular:  RRR Lungs:  Bilateral coarse crackles Abdomen:  Large, supraumbilical hernia Musculoskeletal:  No acute deformity Skin:  intact  LABS:  CBC  Recent Labs Lab 06/09/13 2237 06/09/13 2245  WBC 17.1*  --   HGB 12.0* 13.6  HCT 36.3* 40.0  PLT 296  --    Coag's  Recent Labs Lab 06/09/13 2237  APTT 39*  INR 1.22   BMET  Recent Labs Lab 06/09/13 2237 06/09/13 2245  NA 141 144  K 3.6* 3.4*  CL 105 104  CO2 16*  --   BUN 17 19  CREATININE 1.41* 1.50*  GLUCOSE 173* 168*   Electrolytes  Recent Labs Lab 06/09/13 2237  CALCIUM 7.4*   Sepsis Markers  Recent Labs Lab 06/09/13 2245  LATICACIDVEN 7.64*   ABG  Recent Labs Lab 06/09/13 2248  PHART 7.076*  PCO2ART 56.8*  PO2ART 58.0*  Liver Enzymes No results found for this basename: AST, ALT, ALKPHOS, BILITOT, ALBUMIN,  in the last 168 hours Cardiac Enzymes No results found for this basename: TROPONINI, PROBNP,  in the last 168 hours Glucose  Recent Labs Lab 06/09/13 2257  GLUCAP 169*    Imaging Dg Chest Port 1 View  06/09/2013   CLINICAL DATA:  Hypertension, intubation, post code  EXAM: PORTABLE CHEST - 1 VIEW  COMPARISON:  Portable exam 2218 hr compared to 07/19/2008  FINDINGS: Tip of endotracheal tube is poorly visualized but estimated at 4 cm above carina.  Enlargement of cardiac silhouette with pulmonary vascular congestion.  Diffuse pulmonary edema.  External pacing leads in EKG leads.   Postsurgical changes of CABG.  No gross pleural effusion or pneumothorax.  IMPRESSION: Diffuse pulmonary edema.   Electronically Signed   By: Lavonia Dana M.D.   On: 06/09/2013 22:41     CXR: diffuse pulmonary edema  ASSESSMENT / PLAN:  PULMONARY A: Acute respiratory Failure - likely in setting of pulmonary edema Pulmonary Edema Respiratory Acidosis ARDS?  P:   Full vent support, currently high PEEP demands Deep sedation and NMB for vent synchrony Follow CXR, ABG Scheduled BD Pulmonary hygiene  CARDIOVASCULAR A:  Cardiogenic shock S/p PEA arrest Suspect acute coronary event  P:  Cards following Levophed to keep MAP > 65 Heparin gtt per cardiology Trend Troponins Trend lactic acid Echo in AM  RENAL A:   AKI High AG Metabolic acidosis - Lactic  P:   Careful IVF resuscitation in setting of acute heart failure.  Follow BMP   GASTROINTESTINAL A:   GI PPX  P:   PPI  HEMATOLOGIC A:   No acute issues  P:  Follow CBC  INFECTIOUS A:   SIRS no obvious source  P:   Monitor of ABX Follow Cx and WBC curve Avoid fevers, nurse to contact MD for temp > 37C  ENDOCRINE A:   No known issues  P:   CBG and SSI  NEUROLOGIC A:   Acute encephalopathy Vent dyssynchrony  P:   RASS goal: -3, -4 Neuromuscular blockade protocol Versed gtt, Fentanyl gtt for sedation.    Georgann Housekeeper, ACNP [facility] Pager [phone] or [phone]  I have personally obtained a history, examined the patient, evaluated laboratory and imaging results, formulated the assessment and plan and placed orders. CRITICAL CARE: The patient is critically ill with multiple organ systems failure and requires high complexity decision making for assessment and support, frequent evaluation and titration of therapies, application of advanced monitoring technologies and extensive interpretation of multiple databases. Critical Care Time devoted to patient care  services described in this note is 60 minutes.    Family updated in ED consultation room.    Merton Border, MD ; [facility] [phone].  After 5:30 PM or weekends, call [phone]

## 2013-06-09 NOTE — Consult Note (Signed)
CARDIOLOGY CONSULT NOTE  Patient ID: [name]  MRN: [id number]  DOB: 05/26/1955  Admit date: 06/09/2013 Date of Consult: 06/09/2013  Primary Physician: Elza Rafter, MD Primary Cardiologist: [facility]  Reason for Consultation: Cardiac arrest.  History of Present Illness: [name] is a 58 y.o. male with CAD, s/p 1v CABG (SVG to LAD in 2006), bilateral carotid stenosis, L subclavian stenosis,  renal artery stenosis, ongoing tobacco use, who presents after unwitnessed cardiac arrest at home.  He was found down in his bathroom by 73 year old son, EMS was called, received at least 15 minutes of CPR and 3 rounds of Epi.  Unclear what rhythm was seen in the field. Unclear how long he was down before he was found by children and if bystander CPR was started.  On arrival he is intubated and sedated.  Hypothermia protocol was initially started but subsequently stopped. Hypotensive requiring levophed.  Recent history per review of his [facility] Chart:  Admitted 06/01/13 with NSTEMI, found to have occulsion of SVG to LAD.  This was stented on 5/21.  Post PCI angiogram with evidence of thrombus embolization to mid SVG, retrieved with filter.  Widely patent LCx stent, and insignificant RCA disease.  Of note SVG feeds D1 only as mid LAD is occluded past graft insertion.  EKG with 23mm STE in v1, avR, and v4.  Bedside echo performed by me with globally depressed function, possibly worse in anterior/septal wall.     Past Medical History  Diagnosis Date  . Acute MI     Past Surgical History  Procedure Laterality Date  . Angioplasty       Home Meds: Prior to Admission medications   Not on File    Current Medications: . aspirin  300 mg Rectal NOW  . fentaNYL  100 mcg Intravenous Once  . fentaNYL  50 mcg Intravenous Once  . [START ON 06/10/2013] heparin  5,000 Units Subcutaneous 3 times per day  . LORazepam  1 mg Intravenous Once  . [START ON 06/10/2013] pantoprazole (PROTONIX) IV   40 mg Intravenous QHS     Allergies:   Allergies not on file  Social History:   The patient   lives at home with family, continues to smoke by report.  Family History:   The patient's family history is not on file.   ROS:  Patient intubated.   Vital Signs: Blood pressure 108/78, pulse 88, resp. rate 27, height 5\' 9"  (1.753 m), weight 81.647 kg (180 lb), SpO2 74.00%.   PHYSICAL EXAM: General:  Intubated, sedated Neck: no JVD Cardiac:  normal S1, S2; RRR; no murmur Lungs:  Coarse bilaterally Abd: soft, nontender, no hepatomegaly Ext: no edema Musculoskeletal:  No deformities, BUE and BLE strength normal and equal Skin: warm and dry Neuro:  CNs 2-12 intact, no focal abnormalities noted Psych:  Normal affect   EKG:  ST elevation in AVr (33mm), v1 (35mm) and v4 (16mm).  NSR.  Labs: No results found for this basename: CKTOTAL, CKMB, TROPONINI,  in the last 72 hours Lab Results  Component Value Date   WBC 17.1* 06/09/2013   HGB 13.6 06/09/2013   HCT 40.0 06/09/2013   MCV 96.5 06/09/2013   PLT 296 06/09/2013    Recent Labs Lab 06/09/13 2237 06/09/13 2245  NA 141 144  K 3.6* 3.4*  CL 105 104  CO2 16*  --   BUN 17 19  CREATININE 1.41* 1.50*  CALCIUM 7.4*  --   GLUCOSE  173* 168*   No results found for this basename: CHOL, HDL, LDLCALC, TRIG   No results found for this basename: DDIMER    Radiology/Studies:  Dg Chest Port 1 View  06/09/2013   CLINICAL DATA:  Hypertension, intubation, post code  EXAM: PORTABLE CHEST - 1 VIEW  COMPARISON:  Portable exam 2218 hr compared to 07/19/2008  FINDINGS: Tip of endotracheal tube is poorly visualized but estimated at 4 cm above carina.  Enlargement of cardiac silhouette with pulmonary vascular congestion.  Diffuse pulmonary edema.  External pacing leads in EKG leads.  Postsurgical changes of CABG.  No gross pleural effusion or pneumothorax.  IMPRESSION: Diffuse pulmonary edema.   Electronically Signed   By: Lavonia Dana M.D.   On:  06/09/2013 22:41    ASSESSMENT AND PLAN:  1. Cardiac Arrest s/p PCI of SVG to LAD requiring retrieval of thrombus one week ago. - Case discussed with on call interventionalist Dr. Irish Lack.  Given unwitnessed arrest with unclear down time, and EKG not meeting STEMI criteria, favor conservative medical management for now.  Likely culprit for arrest is embolization of clot into D1 territory, or stent thrombosis (less likely).   - Recommend: -. Therapeutic Heparin gtt -  Amiodarone IV load -  Continue aspirin and plavix - Keep Mg>2, K>4 - Formal echocardiogram in the morning (Echo at [facility] in January with normal function) - Appreciate the critical care service management of this patient.     Cleotis Lema 06/09/2013 11:54 PM

## 2013-06-09 NOTE — ED Notes (Addendum)
un witnessed cardiac arrest.  He was found by kids unresponsive in bathroom. EMS: cyanotic from neck up. 21:18 - CPR started by EMS; EMS gave x 3 amps of epi., placed an ETT (7.0) secured at 20, IO in rt. [place],18 ga. Piv left hand.  EMS gave 50 fentanyl piv, and versed 5 mg piv. Pt. Had an mi x 2 weeks ago. Stent place

## 2013-06-09 NOTE — ED Provider Notes (Signed)
CSN: [id number]     Arrival date & time 06/09/13  2202 History   First MD Initiated Contact with Patient 06/09/13 2227     Chief Complaint  Patient presents with  . Cardiac Arrest     (Consider location/radiation/quality/duration/timing/severity/associated sxs/prior Treatment) Patient is a 58 y.o. male presenting with syncope. The history is provided by the EMS personnel and a relative.  Loss of Consciousness Episode history:  Single Most recent episode:  Today Duration:  30 minutes Timing:  Constant Progression:  Unchanged Chronicity:  New Context comment:  Cardiac arrest, collapse in the bathroom Witnessed: yes (Heard patient fall to the ground)   Associated symptoms comment:  Otherwise normal throughout the day according to family who provides the history Risk factors: coronary artery disease (severe status post CABG and multiple catheterizations)     No past medical history on file. No past surgical history on file. No family history on file. History  Substance Use Topics  . Smoking status: Not on file  . Smokeless tobacco: Not on file  . Alcohol Use: Not on file    Review of Systems  Unable to perform ROS: Acuity of condition  Cardiovascular: Positive for syncope.      Allergies  Review of patient's allergies indicates not on file.  Home Medications   Prior to Admission medications   Not on File   Ht 5\' 9"  (1.753 m)  Wt 180 lb (81.647 kg)  BMI 26.57 kg/m2 Physical Exam  Eyes:  3 mm equal and minimally reactive bilaterally  Neck: No tracheal deviation present.  Cardiovascular:  No murmur heard. Thready pulse with intermittent dropped beats over carotid  Pulmonary/Chest: No stridor. He has rales (diffuse coarse breath sounds bilaterally with bagging).  Abdominal: He exhibits distension.  Musculoskeletal: He exhibits edema (Of bilateral lower extremities).  Neurological: He is unresponsive. GCS eye subscore is 1. GCS verbal subscore is 1. GCS motor  subscore is 1.  Following arrest he is clinching his left arm without any organized volitional movement  Skin: There is pallor.    ED Course  CRITICAL CARE Performed by: Leo Grosser Authorized by: Wandra Arthurs Total critical care time: 30 minutes Critical care time was exclusive of separately billable procedures and treating other patients. Critical care was necessary to treat or prevent imminent or life-threatening deterioration of the following conditions: cardiac failure, circulatory failure and respiratory failure. Critical care was time spent personally by me on the following activities: blood draw for specimens, development of treatment plan with patient or surrogate, discussions with consultants, interpretation of cardiac output measurements, evaluation of patient's response to treatment, examination of patient, obtaining history from patient or surrogate, ordering and performing treatments and interventions, ordering and review of laboratory studies, ordering and review of radiographic studies, pulse oximetry, re-evaluation of patient's condition, review of old charts and ventilator management.   (including critical care time) Labs Review Labs Reviewed  I-STAT VENOUS BLOOD GAS, ED - Abnormal; Notable for the following:    pH, Ven 7.133 (*)    pCO2, Ven 60.5 (*)    Acid-base deficit 9.0 (*)    All other components within normal limits  URINALYSIS, ROUTINE W REFLEX MICROSCOPIC  BLOOD GAS, ARTERIAL  CBC  APTT  BASIC METABOLIC PANEL  PROTIME-INR  I-STAT TROPOININ, ED  I-STAT CG4 LACTIC ACID, ED  I-STAT CHEM 8, ED  CBG MONITORING, ED    Imaging Review No results found.   EKG Interpretation   Date/Time:  Thursday Jun 09 2013 22:14:28  EDT Ventricular Rate:  77 PR Interval:  172 QRS Duration: 118 QT Interval:  448 QTC Calculation: 506 R Axis:   -118 Text Interpretation:  Normal sinus rhythm Right superior axis deviation  Incomplete right bundle branch block Right  ventricular hypertrophy with  repolarization abnormality Possible Anterior infarct , age undetermined  Marked ST abnormality, possible inferior subendocardial injury Prolonged  QT Abnormal ECG No significant change since last tracing Confirmed by YAO   MD, DAVID ([id number]) on 06/09/2013 10:37:53 PM      MDM   Final diagnoses:  Cardiac arrest   58 y.o. male presents with witnessed cardiac arrest where children who live in the home with him heard him go down in the bathroom and found him pulseless and apneic. It took 5-10 minutes for EMS crew to arrive and began compressions. They saw an initial rhythm of V. fib which converted to pulseless electrical activity. The patient was given 3 rounds of epinephrine and achieved return of spontaneous circulation. On arrival here the patient has a thready pulse and then goes into a PEA arrest. Multiple rounds of CPR performed with again return of spontaneous circulation following 2 more rounds of epi. We had difficulty maintaining the patient's oxygen saturation despite aggressive ventilator management. He appears to have signs of early onset ARDS.  The family was updated regarding the status of the patient in critical condition and the cardiology fellow on call and the ICU intensivist were consult for admission. The patient does have a history of recent cardiac arrest following multiple MIs. He does have a remote history of CABG and recent stent placement.  Leo Grosser, MD 06/10/13 [phone]

## 2013-06-10 ENCOUNTER — Inpatient Hospital Stay (HOSPITAL_COMMUNITY): Payer: Medicaid Other

## 2013-06-10 DIAGNOSIS — I469 Cardiac arrest, cause unspecified: Secondary | ICD-10-CM

## 2013-06-10 DIAGNOSIS — J9600 Acute respiratory failure, unspecified whether with hypoxia or hypercapnia: Secondary | ICD-10-CM | POA: Diagnosis present

## 2013-06-10 DIAGNOSIS — I501 Left ventricular failure: Secondary | ICD-10-CM | POA: Diagnosis present

## 2013-06-10 DIAGNOSIS — R570 Cardiogenic shock: Secondary | ICD-10-CM | POA: Diagnosis present

## 2013-06-10 DIAGNOSIS — J9601 Acute respiratory failure with hypoxia: Secondary | ICD-10-CM | POA: Diagnosis present

## 2013-06-10 LAB — BASIC METABOLIC PANEL
BUN: 23 mg/dL (ref 6–23)
BUN: 15 mg/dL (ref 6–23)
BUN: 17 mg/dL (ref 6–23)
BUN: 21 mg/dL (ref 6–23)
CHLORIDE: 105 meq/L (ref 96–112)
CO2: 16 meq/L — AB (ref 19–32)
CO2: 21 mEq/L (ref 19–32)
CO2: 22 mEq/L (ref 19–32)
CO2: 22 meq/L (ref 19–32)
CREATININE: 1.15 mg/dL (ref 0.50–1.35)
Calcium: 6.5 mg/dL — ABNORMAL LOW (ref 8.4–10.5)
Calcium: 7.3 mg/dL — ABNORMAL LOW (ref 8.4–10.5)
Calcium: 7.4 mg/dL — ABNORMAL LOW (ref 8.4–10.5)
Calcium: 6.8 mg/dL — ABNORMAL LOW (ref 8.4–10.5)
Chloride: 89 mEq/L — ABNORMAL LOW (ref 96–112)
Chloride: 104 mEq/L (ref 96–112)
Chloride: 103 mEq/L (ref 96–112)
Creatinine, Ser: 1.05 mg/dL (ref 0.50–1.35)
Creatinine, Ser: 1.35 mg/dL (ref 0.50–1.35)
Creatinine, Ser: 1.27 mg/dL (ref 0.50–1.35)
GFR calc Af Amer: 87 mL/min — ABNORMAL LOW (ref >90)
GFR calc Af Amer: 64 mL/min — ABNORMAL LOW (ref >90)
GFR calc Af Amer: 69 mL/min — ABNORMAL LOW (ref >90)
GFR calc Af Amer: 78 mL/min — ABNORMAL LOW (ref >90)
GFR calc non Af Amer: 75 mL/min — ABNORMAL LOW (ref >90)
GFR calc non Af Amer: 60 mL/min — ABNORMAL LOW (ref >90)
GFR calc non Af Amer: 67 mL/min — ABNORMAL LOW (ref >90)
GFR, EST NON AFRICAN AMERICAN: 56 mL/min — AB (ref >90)
GLUCOSE: 127 mg/dL — AB (ref 70–99)
GLUCOSE: 271 mg/dL — AB (ref 70–99)
GLUCOSE: 729 mg/dL — AB (ref 70–99)
Glucose, Bld: 185 mg/dL — ABNORMAL HIGH (ref 70–99)
POTASSIUM: 4 meq/L (ref 3.7–5.3)
Potassium: 4.8 mEq/L (ref 3.7–5.3)
Potassium: 4.9 mEq/L (ref 3.7–5.3)
Potassium: 4.7 mEq/L (ref 3.7–5.3)
SODIUM: 138 meq/L (ref 137–147)
Sodium: 122 mEq/L — ABNORMAL LOW (ref 137–147)
Sodium: 137 mEq/L (ref 137–147)
Sodium: 142 mEq/L (ref 137–147)

## 2013-06-10 LAB — I-STAT ARTERIAL BLOOD GAS, ED
ACID-BASE DEFICIT: 13 mmol/L — AB (ref 0.0–2.0)
Bicarbonate: 17.7 mEq/L — ABNORMAL LOW (ref 20.0–24.0)
O2 SAT: 75 %
PCO2 ART: 62.1 mmHg — AB (ref 35.0–45.0)
TCO2: 20 mmol/L (ref 0–100)
pH, Arterial: 7.063 — CL (ref 7.350–7.450)
pO2, Arterial: 56 mmHg — ABNORMAL LOW (ref 80.0–100.0)

## 2013-06-10 LAB — URINALYSIS, ROUTINE W REFLEX MICROSCOPIC
BILIRUBIN URINE: NEGATIVE
GLUCOSE, UA: NEGATIVE mg/dL
Ketones, ur: NEGATIVE mg/dL
LEUKOCYTES UA: NEGATIVE
Nitrite: NEGATIVE
Specific Gravity, Urine: 1.018 (ref 1.005–1.030)
UROBILINOGEN UA: 0.2 mg/dL (ref 0.0–1.0)
pH: 5 (ref 5.0–8.0)

## 2013-06-10 LAB — GLUCOSE, CAPILLARY
GLUCOSE-CAPILLARY: 277 mg/dL — AB (ref 70–99)
Glucose-Capillary: 108 mg/dL — ABNORMAL HIGH (ref 70–99)
Glucose-Capillary: 123 mg/dL — ABNORMAL HIGH (ref 70–99)
Glucose-Capillary: 127 mg/dL — ABNORMAL HIGH (ref 70–99)
Glucose-Capillary: 172 mg/dL — ABNORMAL HIGH (ref 70–99)
Glucose-Capillary: 248 mg/dL — ABNORMAL HIGH (ref 70–99)

## 2013-06-10 LAB — POCT I-STAT 3, ART BLOOD GAS (G3+)
Acid-base deficit: 9 mmol/L — ABNORMAL HIGH (ref 0.0–2.0)
Bicarbonate: 16.8 mEq/L — ABNORMAL LOW (ref 20.0–24.0)
O2 Saturation: 98 %
PCO2 ART: 33.7 mmHg — AB (ref 35.0–45.0)
Patient temperature: 97.3
TCO2: 18 mmol/L (ref 0–100)
pH, Arterial: 7.302 — ABNORMAL LOW (ref 7.350–7.450)
pO2, Arterial: 118 mmHg — ABNORMAL HIGH (ref 80.0–100.0)

## 2013-06-10 LAB — CBC
HEMATOCRIT: 35.3 % — AB (ref 39.0–52.0)
Hemoglobin: 11.5 g/dL — ABNORMAL LOW (ref 13.0–17.0)
MCH: 31.9 pg (ref 26.0–34.0)
MCHC: 32.6 g/dL (ref 30.0–36.0)
MCV: 98.1 fL (ref 78.0–100.0)
Platelets: 314 10*3/uL (ref 150–400)
RBC: 3.6 MIL/uL — AB (ref 4.22–5.81)
RDW: 15 % (ref 11.5–15.5)
WBC: 24 10*3/uL — AB (ref 4.0–10.5)

## 2013-06-10 LAB — TROPONIN I: Troponin I: 1.54 ng/mL (ref <0.30)

## 2013-06-10 LAB — MAGNESIUM: MAGNESIUM: 2 mg/dL (ref 1.5–2.5)

## 2013-06-10 LAB — HEPARIN LEVEL (UNFRACTIONATED)
Heparin Unfractionated: 0.12 IU/mL — ABNORMAL LOW (ref 0.30–0.70)
Heparin Unfractionated: 0.36 IU/mL (ref 0.30–0.70)

## 2013-06-10 LAB — LACTIC ACID, PLASMA
LACTIC ACID, VENOUS: 6.1 mmol/L — AB (ref 0.5–2.2)
Lactic Acid, Venous: 2.9 mmol/L — ABNORMAL HIGH (ref 0.5–2.2)
Lactic Acid, Venous: 2.3 mmol/L — ABNORMAL HIGH (ref 0.5–2.2)

## 2013-06-10 LAB — URINE MICROSCOPIC-ADD ON

## 2013-06-10 LAB — MRSA PCR SCREENING: MRSA BY PCR: NEGATIVE

## 2013-06-10 LAB — PHOSPHORUS: Phosphorus: 2.2 mg/dL — ABNORMAL LOW (ref 2.3–4.6)

## 2013-06-10 MED ORDER — CISATRACURIUM BOLUS VIA INFUSION: 5.0000 mg | Freq: Once | INTRAVENOUS | Status: DC

## 2013-06-10 MED ORDER — ALBUTEROL SULFATE (2.5 MG/3ML) 0.083% IN NEBU: 2.5000 mg | INHALATION_SOLUTION | RESPIRATORY_TRACT | Status: DC | PRN

## 2013-06-10 MED ORDER — ARTIFICIAL TEARS OP OINT
1.0000 "application " | TOPICAL_OINTMENT | Freq: Three times a day (TID) | OPHTHALMIC | Status: DC
  Administered 2013-06-10 – 2013-06-11 (×4): 1 via OPHTHALMIC
  Filled 2013-06-10 (×2): qty 3.5

## 2013-06-10 MED ORDER — SODIUM CHLORIDE 0.9 % IV SOLN
3.0000 g | Freq: Four times a day (QID) | INTRAVENOUS | Status: DC
  Administered 2013-06-10 – 2013-06-14 (×16): 3 g via INTRAVENOUS
  Filled 2013-06-10 (×19): qty 3

## 2013-06-10 MED ORDER — ASPIRIN 325 MG PO TABS
325.0000 mg | ORAL_TABLET | Freq: Every day | ORAL | Status: DC
  Administered 2013-06-10 – 2013-06-11 (×2): 325 mg via ORAL
  Filled 2013-06-10 (×2): qty 1

## 2013-06-10 MED ORDER — THIAMINE HCL 100 MG/ML IJ SOLN
100.0000 mg | Freq: Every day | INTRAMUSCULAR | Status: DC
  Administered 2013-06-11: 100 mg via INTRAVENOUS
  Filled 2013-06-10 (×9): qty 1

## 2013-06-10 MED ORDER — AMIODARONE HCL IN DEXTROSE 360-4.14 MG/200ML-% IV SOLN
30.0000 mg/h | INTRAVENOUS | Status: DC
  Administered 2013-06-10 – 2013-06-11 (×2): 30 mg/h via INTRAVENOUS
  Filled 2013-06-10 (×6): qty 200

## 2013-06-10 MED ORDER — ATORVASTATIN CALCIUM 80 MG PO TABS
80.0000 mg | ORAL_TABLET | Freq: Every day | ORAL | Status: DC
  Administered 2013-06-10 – 2013-06-12 (×2): 80 mg via ORAL
  Filled 2013-06-10 (×4): qty 1

## 2013-06-10 MED ORDER — PHENYLEPHRINE HCL 10 MG/ML IJ SOLN
30.0000 ug/min | INTRAVENOUS | Status: DC
  Filled 2013-06-10: qty 2

## 2013-06-10 MED ORDER — FENTANYL BOLUS VIA INFUSION
50.0000 ug | INTRAVENOUS | Status: DC | PRN
  Filled 2013-06-10: qty 100

## 2013-06-10 MED ORDER — SODIUM CHLORIDE 0.9 % IV SOLN
3.0000 ug/kg/min | INTRAVENOUS | Status: DC
  Administered 2013-06-10: 3 ug/kg/min via INTRAVENOUS
  Administered 2013-06-10: 3.5 ug/kg/min via INTRAVENOUS
  Administered 2013-06-10: 1 ug/kg/min via INTRAVENOUS
  Administered 2013-06-11: 4.003 ug/kg/min via INTRAVENOUS
  Filled 2013-06-10 (×2): qty 20

## 2013-06-10 MED ORDER — AMIODARONE LOAD VIA INFUSION
150.0000 mg | Freq: Once | INTRAVENOUS | Status: AC
  Administered 2013-06-10: 150 mg via INTRAVENOUS
  Filled 2013-06-10: qty 83.34

## 2013-06-10 MED ORDER — MIDAZOLAM BOLUS VIA INFUSION
1.0000 mg | INTRAVENOUS | Status: DC | PRN
  Filled 2013-06-10: qty 2

## 2013-06-10 MED ORDER — IPRATROPIUM-ALBUTEROL 0.5-2.5 (3) MG/3ML IN SOLN
3.0000 mL | RESPIRATORY_TRACT | Status: DC
  Administered 2013-06-10 (×2): 3 mL via RESPIRATORY_TRACT
  Filled 2013-06-10 (×2): qty 3

## 2013-06-10 MED ORDER — VITAMIN B-1 100 MG PO TABS
100.0000 mg | ORAL_TABLET | Freq: Every day | ORAL | Status: DC
  Administered 2013-06-10 – 2013-06-20 (×10): 100 mg via ORAL
  Filled 2013-06-10 (×11): qty 1

## 2013-06-10 MED ORDER — AMIODARONE HCL IN DEXTROSE 360-4.14 MG/200ML-% IV SOLN
60.0000 mg/h | INTRAVENOUS | Status: AC
  Administered 2013-06-10 (×2): 60 mg/h via INTRAVENOUS
  Filled 2013-06-10 (×2): qty 200

## 2013-06-10 MED ORDER — HEPARIN BOLUS VIA INFUSION
2500.0000 [IU] | Freq: Once | INTRAVENOUS | Status: AC
  Administered 2013-06-10: 2500 [IU] via INTRAVENOUS
  Filled 2013-06-10: qty 2500

## 2013-06-10 MED ORDER — HEPARIN (PORCINE) IN NACL 100-0.45 UNIT/ML-% IJ SOLN
1550.0000 [IU]/h | INTRAMUSCULAR | Status: DC
  Administered 2013-06-10: 800 [IU]/h via INTRAVENOUS
  Administered 2013-06-10: 1100 [IU]/h via INTRAVENOUS
  Administered 2013-06-11: 1150 [IU]/h via INTRAVENOUS
  Administered 2013-06-12: 1350 [IU]/h via INTRAVENOUS
  Administered 2013-06-13 – 2013-06-14 (×3): 1500 [IU]/h via INTRAVENOUS
  Administered 2013-06-14: 1550 [IU]/h via INTRAVENOUS
  Filled 2013-06-10 (×13): qty 250

## 2013-06-10 MED ORDER — MIDAZOLAM HCL 2 MG/2ML IJ SOLN
INTRAMUSCULAR | Status: AC
  Filled 2013-06-10: qty 2

## 2013-06-10 MED ORDER — SODIUM BICARBONATE 8.4 % IV SOLN
INTRAVENOUS | Status: DC
  Administered 2013-06-10: 03:00:00 via INTRAVENOUS
  Filled 2013-06-10: qty 150

## 2013-06-10 MED ORDER — ATORVASTATIN CALCIUM 40 MG PO TABS
40.0000 mg | ORAL_TABLET | Freq: Every day | ORAL | Status: DC
  Filled 2013-06-10: qty 1

## 2013-06-10 MED ORDER — CHLORHEXIDINE GLUCONATE 0.12 % MT SOLN
15.0000 mL | Freq: Two times a day (BID) | OROMUCOSAL | Status: DC
  Administered 2013-06-10 – 2013-06-19 (×15): 15 mL via OROMUCOSAL
  Filled 2013-06-10 (×20): qty 15

## 2013-06-10 MED ORDER — SODIUM CHLORIDE 0.9 % IV SOLN
0.0000 ug/h | INTRAVENOUS | Status: DC
  Administered 2013-06-10: 400 ug/h via INTRAVENOUS
  Administered 2013-06-10: 150 ug/h via INTRAVENOUS
  Administered 2013-06-10 – 2013-06-11 (×2): 400 ug/h via INTRAVENOUS
  Administered 2013-06-12: 200 ug/h via INTRAVENOUS
  Filled 2013-06-10 (×5): qty 50

## 2013-06-10 MED ORDER — SODIUM CHLORIDE 0.9 % IV SOLN
1.0000 g | Freq: Once | INTRAVENOUS | Status: AC
  Administered 2013-06-10: 1 g via INTRAVENOUS
  Filled 2013-06-10: qty 10

## 2013-06-10 MED ORDER — INSULIN ASPART 100 UNIT/ML ~~LOC~~ SOLN
2.0000 [IU] | SUBCUTANEOUS | Status: DC
  Administered 2013-06-10: 4 [IU] via SUBCUTANEOUS
  Administered 2013-06-10 – 2013-06-11 (×5): 2 [IU] via SUBCUTANEOUS

## 2013-06-10 MED ORDER — SODIUM CHLORIDE 0.9 % IV SOLN: 0.5000 ug/kg/min | INTRAVENOUS | Status: DC

## 2013-06-10 MED ORDER — CISATRACURIUM BOLUS VIA INFUSION
8.0000 mg | Freq: Once | INTRAVENOUS | Status: DC
  Filled 2013-06-10: qty 8

## 2013-06-10 MED ORDER — FENTANYL CITRATE 0.05 MG/ML IJ SOLN: 50.0000 ug | Freq: Once | INTRAMUSCULAR | Status: DC

## 2013-06-10 MED ORDER — INSULIN ASPART 100 UNIT/ML ~~LOC~~ SOLN
0.0000 [IU] | SUBCUTANEOUS | Status: DC
  Administered 2013-06-10: 5 [IU] via SUBCUTANEOUS
  Administered 2013-06-10: 3 [IU] via SUBCUTANEOUS

## 2013-06-10 MED ORDER — HEPARIN BOLUS VIA INFUSION
4000.0000 [IU] | Freq: Once | INTRAVENOUS | Status: AC
  Administered 2013-06-10: 4000 [IU] via INTRAVENOUS
  Filled 2013-06-10: qty 4000

## 2013-06-10 MED ORDER — SODIUM CHLORIDE 0.9 % IV SOLN
0.0000 mg/h | INTRAVENOUS | Status: DC
  Administered 2013-06-10 (×2): 8 mg/h via INTRAVENOUS
  Administered 2013-06-10: 3 mg/h via INTRAVENOUS
  Administered 2013-06-11: 8 mg/h via INTRAVENOUS
  Administered 2013-06-11: 4 mg/h via INTRAVENOUS
  Administered 2013-06-11: 8 mg/h via INTRAVENOUS
  Filled 2013-06-10 (×6): qty 10

## 2013-06-10 MED ORDER — NOREPINEPHRINE BITARTRATE 1 MG/ML IV SOLN
2.0000 ug/min | INTRAVENOUS | Status: DC
  Administered 2013-06-10: 50 ug/min via INTRAVENOUS
  Administered 2013-06-10: 20 ug/min via INTRAVENOUS
  Administered 2013-06-10: 30 ug/min via INTRAVENOUS
  Administered 2013-06-11: 10 ug/min via INTRAVENOUS
  Filled 2013-06-10 (×5): qty 16

## 2013-06-10 MED ORDER — SODIUM BICARBONATE 8.4 % IV SOLN
INTRAVENOUS | Status: DC
  Filled 2013-06-10 (×2): qty 150

## 2013-06-10 MED ORDER — CLOPIDOGREL BISULFATE 75 MG PO TABS
75.0000 mg | ORAL_TABLET | Freq: Every day | ORAL | Status: DC
  Administered 2013-06-11: 75 mg via ORAL
  Filled 2013-06-10 (×2): qty 1

## 2013-06-10 MED ORDER — BIOTENE DRY MOUTH MT LIQD
15.0000 mL | OROMUCOSAL | Status: DC
  Administered 2013-06-10 – 2013-06-14 (×22): 15 mL via OROMUCOSAL

## 2013-06-10 MED ORDER — POTASSIUM CHLORIDE 10 MEQ/50ML IV SOLN
10.0000 meq | INTRAVENOUS | Status: AC
  Administered 2013-06-10 (×4): 10 meq via INTRAVENOUS
  Filled 2013-06-10 (×7): qty 50

## 2013-06-10 MED FILL — Medication: Qty: 1 | Status: AC

## 2013-06-10 NOTE — Progress Notes (Signed)
Arterial blood gas results discussed with CCM.

## 2013-06-10 NOTE — ED Provider Notes (Addendum)
I saw and evaluated the patient, reviewed the resident's note and I agree with the findings and plan.   EKG Interpretation   Date/Time:  Thursday Jun 09 2013 22:14:28 EDT Ventricular Rate:  77 PR Interval:  172 QRS Duration: 118 QT Interval:  448 QTC Calculation: 506 R Axis:   -118 Text Interpretation:  Normal sinus rhythm Right superior axis deviation  Incomplete right bundle branch block Right ventricular hypertrophy with  repolarization abnormality Possible Anterior infarct , age undetermined  Marked ST abnormality, possible inferior subendocardial injury Prolonged  QT Abnormal ECG No significant change since last tracing Confirmed by YAO   MD, DAVID ([id number]) on 06/09/2013 10:37:53 PM      [name] is a 58 y.o. male hx of CAD s/p CABG and recent stent placement at [facility] here with post cardiac arrest. This afternoon, he was at home with his kids and his kids heard him fall and found him unresponsive. They called EMS, who arrived 5-10 min later and found in the cardiac arrest and started CPR. He was coded for about 15 min en route and hypothermia protocol started by EMS. Patient also was intubated by EMS. In the ED, ET tube confirmed. Patient had bilateral breath sounds. Diminish heart sounds. He coded twice in the ED. He was noted to have a wide complex rhythm after a code so amiodarone 300 mg was given. He was hypotensive and started on levophed. I called critical care, who recommend not starting hypothermia. I also called cardiology, who wants him started on heparin and amiodarone drip. We updated family and patient admitted to the ICU.   Level v caveat- condition of patient   CRITICAL CARE Performed by: Wandra Arthurs   Total critical care time: 30 min   Critical care time was exclusive of separately billable procedures and treating other patients.  Critical care was necessary to treat or prevent imminent or life-threatening deterioration.  Critical care was time spent  personally by me on the following activities: development of treatment plan with patient and/or surrogate as well as nursing, discussions with consultants, evaluation of patient's response to treatment, examination of patient, obtaining history from patient or surrogate, ordering and performing treatments and interventions, ordering and review of laboratory studies, ordering and review of radiographic studies, pulse oximetry and re-evaluation of patient's condition.  Cardiopulmonary Resuscitation (CPR) Procedure Note Directed/Performed by: Wandra Arthurs I personally directed ancillary staff and/or performed CPR in an effort to regain return of spontaneous circulation and to maintain cardiac, neuro and systemic perfusion.     Wandra Arthurs, MD 06/10/13 Blanchard Yao, MD 06/10/13 [phone]

## 2013-06-10 NOTE — Progress Notes (Signed)
ANTICOAGULATION CONSULT NOTE - Follow Up Consult  Pharmacy Consult for heparin gtt Indication: chest pain/ACS  No Known Allergies  Patient Measurements: Height: 5\' 9"  (175.3 cm) Weight: 191 lb 2.2 oz (86.7 kg) IBW/kg (Calculated) : 70.7 Heparin Dosing Weight: 86.7 kg (actual body weight)  Vital Signs: Temp: 98.7 F (37.1 C) (05/29 1200) Temp src: Oral (05/29 1200) BP: 110/65 mmHg (05/29 1123) Pulse Rate: 62 (05/29 1123)  Labs:  Recent Labs  06/09/13 2237 06/09/13 2245 06/10/13 0010 06/10/13 0744 06/10/13 1000 06/10/13 1115 06/10/13 1119  HGB 12.0* 13.6 11.5*  --   --   --   --   HCT 36.3* 40.0 35.3*  --   --   --   --   PLT 296  --  314  --   --   --   --   APTT 39*  --   --   --   --   --   --   LABPROT 15.1  --   --   --   --   --   --   INR 1.22  --   --   --   --   --   --   HEPARINUNFRC  --   --   --   --  0.12*  --   --   CREATININE 1.41* 1.50* 1.05 1.35  --  1.27  --   TROPONINI  --   --  1.54*  --   --   --  >20.00*    Estimated Creatinine Clearance: 67.5 ml/min (by C-G formula based on Cr of 1.27).   Medications:  Scheduled:  . ampicillin-sulbactam (UNASYN) IV  3 g Intravenous Q6H  . antiseptic oral rinse  15 mL Mouth Rinse 6 times per day  . artificial tears  1 application Both Eyes 3 times per day  . aspirin  325 mg Oral Daily  . atorvastatin  80 mg Oral q1800  . chlorhexidine  15 mL Mouth/Throat BID  . cisatracurium  8 mg Intravenous Once  . [START ON 06/11/2013] clopidogrel  75 mg Oral Q breakfast  . insulin aspart  2-6 Units Subcutaneous 6 times per day  . midazolam      . pantoprazole (PROTONIX) IV  40 mg Intravenous QHS  . thiamine  100 mg Oral Daily   Or  . thiamine IV  100 mg Intravenous Daily   Infusions:  . amiodarone 30 mg/hr (06/10/13 0700)  . cisatracurium (NIMBEX) infusion 3.5 mcg/kg/min (06/10/13 1224)  . fentaNYL infusion INTRAVENOUS 400 mcg/hr (06/10/13 1159)  . heparin 800 Units/hr (06/10/13 0700)  . midazolam (VERSED)  infusion 8 mg/hr (06/10/13 0930)  . norepinephrine (LEVOPHED) Adult infusion 30 mcg/min (06/10/13 1017)    Assessment: 58 y/o M with significant cardiac history s/p unwitnessed cardiac arrest. He had ROSC with EMS and was intubated in the field.  Upon arrival to the ER, he coded an additional two times with subsequent ROSC.  Patient received 2L of cold saline with plans for arctic sun protocol, but patient was ultimately not cooled.  He was given a bolus of 4000 units and heparin IV rate started at 800 units/hr.  Follow-up HL is subtherapeutic at 0.12.  Patient is still on ventilator, sedated, and paralyzed this morning, but as mentioned, not undergoing therapeutic hypothermia.  H/H is low with a slight trend down, and plt are wnl. SCr is down to 1.27 with a CrCl ~68.  No line or bleeding complications noted.  Goal  of Therapy:  Heparin level 0.3-0.7 units/ml Monitor platelets by anticoagulation protocol: Yes   Plan:  - give heparin IV re-bolus of 2500 units, followed by a rate increase to 1100 units/hr - draw 6h HL - daily HL and CBC - monitor for s/s of bleeding  Ovid Curd E. Jacqlyn Larsen, PharmD Clinical Pharmacist - Resident Pager: [phone] Pharmacy: [phone] 06/10/2013 12:27 PM

## 2013-06-10 NOTE — Progress Notes (Addendum)
Inpatient Diabetes Program Recommendations  AACE/ADA: New Consensus Statement on Inpatient Glycemic Control (2013)  Target Ranges:  Prepandial:   less than 140 mg/dL      Peak postprandial:   less than 180 mg/dL (1-2 hours)      Critically ill patients:  140 - 180 mg/dL   Reason for Visit: Results for PETR, BONTEMPO (MRN [id number]) as of 06/10/2013 11:17  Ref. Range 06/10/2013 07:44  Glucose Latest Range: 70-99 mg/dL 271 (H)    Diabetes history: None Outpatient Diabetes medications: None Current orders for Inpatient glycemic control: Novolog sensitive q 4 hours  Please consider ICU Glycemic Control protocol.  Paged MD to discuss. Order received to start "Phase 1" of ICU Glycemic control protocol.  Adah Perl, RN, BC-ADM Inpatient Diabetes Coordinator Pager [phone]

## 2013-06-10 NOTE — Progress Notes (Signed)
eLink Physician-Brief Progress Note Patient Name: JESUS NEVILLS DOB: Jan 24, 1955 MRN: [id number]  Date of Service  06/10/2013   HPI/Events of Note   rn says maxed out on levophed at 67mcg  eICU Interventions  Add neo Place aline Check abg   Intervention Category Major Interventions: Shock - evaluation and management  Clementine Soulliere 06/10/2013, 2:11 AM

## 2013-06-10 NOTE — ED Notes (Signed)
Cardiology at bedside.

## 2013-06-10 NOTE — Progress Notes (Signed)
RT and charge RT attempted to place arterial line but was unsuccessful. RN notified MD. MD to attempt arterial line.

## 2013-06-10 NOTE — Progress Notes (Signed)
[facility] Physician-Brief Progress Note Patient Name: [name] DOB: Jan 07, 1956 MRN: [id number]  Date of Service  06/10/2013   HPI/Events of Note    Recent Labs Lab 06/09/13 2235 06/09/13 2245 06/09/13 2248 06/10/13 0012  PHART  --   --  7.076* 7.063*  PCO2ART  --   --  56.8* 62.1*  PO2ART  --   --  58.0* 56.0*  HCO3 20.2  --  16.7* 17.7*  TCO2 22 19 18 20   O2SAT 39.0  --  77.0 75.0    Recent Labs Lab 06/09/13 2237 06/09/13 2245 06/10/13 0010  NA 141 144 122*  K 3.6* 3.4* 4.8  CL 105 104 89*  CO2 16*  --  16*  GLUCOSE 173* 168* 729*  BUN 17 19 15   CREATININE 1.41* 1.50* 1.05  CALCIUM 7.4*  --  6.5*     eICU Interventions  Severe metabolic and resp acidosis - this is on 3amp at 50cc/h Low calcium   - start calcium gluconate   Intervention Category Major Interventions: Acid-Base disturbance - evaluation and management  [name] 06/10/2013, 3:09 AM

## 2013-06-10 NOTE — Progress Notes (Signed)
SUBJECTIVE:  [name] is a 58 y.o. male with PMH CAD, s/p 1v CABG (SVG to LAD in 2006), recent hospital admission at [facility] on 5/20 with NSTEMI and occlusion of SVG to LAD with stent placed 5/21, bilateral carotid stenosis, L subclavian stenosis, renal artery stenosis, ongoing tobacco use, who presents after unwitnessed cardiac arrest at home requring ~15 minutes of CPR and Epi x3 for ROSC with subsequent codes x2 in ED again.  Intubated and sedated on arrival. Hypothermia protocol was initially started but subsequently stopped. Hypotensive requiring pressors.    This morning, sedated and paralyzed. Daughter by bedside. She says her brother and husband found him down and started cpr within 5 minutes. She reports him to be feeling fine all day and compliance with his medications except for his fluid pills.   PHYSICAL EXAM Filed Vitals:   06/10/13 0645 06/10/13 0700 06/10/13 0715 06/10/13 0716  BP: 98/68 97/68 110/69   Pulse: 61 60 58   Temp: 98.1 F (36.7 C) 98.1 F (36.7 C) 98.2 F (36.8 C)   TempSrc:      Resp: 24 24 24    Height:      Weight:      SpO2: 100% 100% 100% 100%   General:  Lying in bed, paralyzed, sedated, on mechanical ventilation Lungs:  Coarse b/l breath sounds Heart:  RRR Abdomen:  Soft, distended, hypoactive bowel sounds Extremities:  Trace edema, cool lower extremities, faint distal pulses, +tattoos  LABS: Lab Results  Component Value Date   TROPONINI 1.54* 06/10/2013   Results for orders placed during the hospital encounter of 06/09/13 (from the past 24 hour(s))  I-STAT VENOUS BLOOD GAS, ED     Status: Abnormal   Collection Time    06/09/13 10:35 PM      Result Value Ref Range   pH, Ven 7.133 (*) 7.250 - 7.300   pCO2, Ven 60.5 (*) 45.0 - 50.0 mmHg   pO2, Ven 30.0  30.0 - 45.0 mmHg   Bicarbonate 20.2  20.0 - 24.0 mEq/L   TCO2 22  0 - 100 mmol/L   O2 Saturation 39.0     Acid-base deficit 9.0 (*) 0.0 - 2.0 mmol/L   Patient temperature 98.6 F     Collection site RADIAL, ALLEN'S TEST ACCEPTABLE     Drawn by Operator     Sample type VENOUS     Comment NOTIFIED PHYSICIAN    CBC     Status: Abnormal   Collection Time    06/09/13 10:37 PM      Result Value Ref Range   WBC 17.1 (*) 4.0 - 10.5 K/uL   RBC 3.76 (*) 4.22 - 5.81 MIL/uL   Hemoglobin 12.0 (*) 13.0 - 17.0 g/dL   HCT 36.3 (*) 39.0 - 52.0 %   MCV 96.5  78.0 - 100.0 fL   MCH 31.9  26.0 - 34.0 pg   MCHC 33.1  30.0 - 36.0 g/dL   RDW 14.5  11.5 - 15.5 %   Platelets 296  150 - 400 K/uL  APTT     Status: Abnormal   Collection Time    06/09/13 10:37 PM      Result Value Ref Range   aPTT 39 (*) 24 - 37 seconds  BASIC METABOLIC PANEL     Status: Abnormal   Collection Time    06/09/13 10:37 PM      Result Value Ref Range   Sodium 141  137 - 147 mEq/L  Potassium 3.6 (*) 3.7 - 5.3 mEq/L   Chloride 105  96 - 112 mEq/L   CO2 16 (*) 19 - 32 mEq/L   Glucose, Bld 173 (*) 70 - 99 mg/dL   BUN 17  6 - 23 mg/dL   Creatinine, Ser 1.41 (*) 0.50 - 1.35 mg/dL   Calcium 7.4 (*) 8.4 - 10.5 mg/dL   GFR calc non Af Amer 53 (*) >90 mL/min   GFR calc Af Amer 61 (*) >90 mL/min  PROTIME-INR     Status: None   Collection Time    06/09/13 10:37 PM      Result Value Ref Range   Prothrombin Time 15.1  11.6 - 15.2 seconds   INR 1.22  0.00 - 1.49  I-STAT TROPOININ, ED     Status: Abnormal   Collection Time    06/09/13 10:43 PM      Result Value Ref Range   Troponin i, poc 0.26 (*) 0.00 - 0.08 ng/mL   Comment NOTIFIED PHYSICIAN     Comment 3           I-STAT CG4 LACTIC ACID, ED     Status: Abnormal   Collection Time    06/09/13 10:45 PM      Result Value Ref Range   Lactic Acid, Venous 7.64 (*) 0.5 - 2.2 mmol/L  I-STAT CHEM 8, ED     Status: Abnormal   Collection Time    06/09/13 10:45 PM      Result Value Ref Range   Sodium 144  137 - 147 mEq/L   Potassium 3.4 (*) 3.7 - 5.3 mEq/L   Chloride 104  96 - 112 mEq/L   BUN 19  6 - 23 mg/dL   Creatinine, Ser 1.50 (*) 0.50 - 1.35 mg/dL    Glucose, Bld 168 (*) 70 - 99 mg/dL   Calcium, Ion 1.08 (*) 1.13 - 1.30 mmol/L   TCO2 19  0 - 100 mmol/L   Hemoglobin 13.6  13.0 - 17.0 g/dL   HCT 40.0  39.0 - 52.0 %  I-STAT ARTERIAL BLOOD GAS, ED     Status: Abnormal   Collection Time    06/09/13 10:48 PM      Result Value Ref Range   pH, Arterial 7.076 (*) 7.350 - 7.450   pCO2 arterial 56.8 (*) 35.0 - 45.0 mmHg   pO2, Arterial 58.0 (*) 80.0 - 100.0 mmHg   Bicarbonate 16.7 (*) 20.0 - 24.0 mEq/L   TCO2 18  0 - 100 mmol/L   O2 Saturation 77.0     Acid-base deficit 13.0 (*) 0.0 - 2.0 mmol/L   Patient temperature 98.6 F     Collection site RADIAL, ALLEN'S TEST ACCEPTABLE     Drawn by Operator     Sample type ARTERIAL     Comment NOTIFIED PHYSICIAN    CBG MONITORING, ED     Status: Abnormal   Collection Time    06/09/13 10:57 PM      Result Value Ref Range   Glucose-Capillary 169 (*) 70 - 99 mg/dL  BASIC METABOLIC PANEL     Status: Abnormal   Collection Time    06/10/13 12:10 AM      Result Value Ref Range   Sodium 122 (*) 137 - 147 mEq/L   Potassium 4.8  3.7 - 5.3 mEq/L   Chloride 89 (*) 96 - 112 mEq/L   CO2 16 (*) 19 - 32 mEq/L   Glucose, Bld 729 (*) 70 -  99 mg/dL   BUN 15  6 - 23 mg/dL   Creatinine, Ser 1.77  0.50 - 1.35 mg/dL   Calcium 6.5 (*) 8.4 - 10.5 mg/dL   GFR calc non Af Amer 75 (*) >90 mL/min   GFR calc Af Amer 87 (*) >90 mL/min  LACTIC ACID, PLASMA     Status: Abnormal   Collection Time    06/10/13 12:10 AM      Result Value Ref Range   Lactic Acid, Venous 6.1 (*) 0.5 - 2.2 mmol/L  TROPONIN I     Status: Abnormal   Collection Time    06/10/13 12:10 AM      Result Value Ref Range   Troponin I 1.54 (*) <0.30 ng/mL  CBC     Status: Abnormal   Collection Time    06/10/13 12:10 AM      Result Value Ref Range   WBC 24.0 (*) 4.0 - 10.5 K/uL   RBC 3.60 (*) 4.22 - 5.81 MIL/uL   Hemoglobin 11.5 (*) 13.0 - 17.0 g/dL   HCT 93.9 (*) 03.0 - 09.2 %   MCV 98.1  78.0 - 100.0 fL   MCH 31.9  26.0 - 34.0 pg   MCHC  32.6  30.0 - 36.0 g/dL   RDW 33.0  07.6 - 22.6 %   Platelets 314  150 - 400 K/uL  I-STAT ARTERIAL BLOOD GAS, ED     Status: Abnormal   Collection Time    06/10/13 12:12 AM      Result Value Ref Range   pH, Arterial 7.063 (*) 7.350 - 7.450   pCO2 arterial 62.1 (*) 35.0 - 45.0 mmHg   pO2, Arterial 56.0 (*) 80.0 - 100.0 mmHg   Bicarbonate 17.7 (*) 20.0 - 24.0 mEq/L   TCO2 20  0 - 100 mmol/L   O2 Saturation 75.0     Acid-base deficit 13.0 (*) 0.0 - 2.0 mmol/L   Patient temperature 98.6 F     Collection site RADIAL, ALLEN'S TEST ACCEPTABLE     Drawn by Operator     Sample type ARTERIAL     Comment NOTIFIED PHYSICIAN    URINALYSIS, ROUTINE W REFLEX MICROSCOPIC     Status: Abnormal   Collection Time    06/10/13 12:35 AM      Result Value Ref Range   Color, Urine YELLOW  YELLOW   APPearance TURBID (*) CLEAR   Specific Gravity, Urine 1.018  1.005 - 1.030   pH 5.0  5.0 - 8.0   Glucose, UA NEGATIVE  NEGATIVE mg/dL   Hgb urine dipstick LARGE (*) NEGATIVE   Bilirubin Urine NEGATIVE  NEGATIVE   Ketones, ur NEGATIVE  NEGATIVE mg/dL   Protein, ur >333 (*) NEGATIVE mg/dL   Urobilinogen, UA 0.2  0.0 - 1.0 mg/dL   Nitrite NEGATIVE  NEGATIVE   Leukocytes, UA NEGATIVE  NEGATIVE  URINE MICROSCOPIC-ADD ON     Status: Abnormal   Collection Time    06/10/13 12:35 AM      Result Value Ref Range   Squamous Epithelial / LPF RARE  RARE   WBC, UA 11-20  <3 WBC/hpf   RBC / HPF TOO NUMEROUS TO COUNT  <3 RBC/hpf   Bacteria, UA FEW (*) RARE   Casts HYALINE CASTS (*) NEGATIVE   Urine-Other SPERM PRESENT    POCT I-STAT 3, ART BLOOD GAS (G3+)     Status: Abnormal   Collection Time    06/10/13  3:26 AM  Result Value Ref Range   pH, Arterial 7.302 (*) 7.350 - 7.450   pCO2 arterial 33.7 (*) 35.0 - 45.0 mmHg   pO2, Arterial 118.0 (*) 80.0 - 100.0 mmHg   Bicarbonate 16.8 (*) 20.0 - 24.0 mEq/L   TCO2 18  0 - 100 mmol/L   O2 Saturation 98.0     Acid-base deficit 9.0 (*) 0.0 - 2.0 mmol/L   Patient  temperature 97.3 F     Collection site RADIAL, ALLEN'S TEST ACCEPTABLE     Drawn by RT     Sample type ARTERIAL    MAGNESIUM     Status: None   Collection Time    06/10/13  4:18 AM      Result Value Ref Range   Magnesium 2.0  1.5 - 2.5 mg/dL  PHOSPHORUS     Status: Abnormal   Collection Time    06/10/13  4:18 AM      Result Value Ref Range   Phosphorus 2.2 (*) 2.3 - 4.6 mg/dL  GLUCOSE, CAPILLARY     Status: Abnormal   Collection Time    06/10/13  4:21 AM      Result Value Ref Range   Glucose-Capillary 277 (*) 70 - 99 mg/dL  MRSA PCR SCREENING     Status: None   Collection Time    06/10/13  5:44 AM      Result Value Ref Range   MRSA by PCR NEGATIVE  NEGATIVE  GLUCOSE, CAPILLARY     Status: Abnormal   Collection Time    06/10/13  7:36 AM      Result Value Ref Range   Glucose-Capillary 248 (*) 70 - 99 mg/dL    Intake/Output Summary (Last 24 hours) at 06/10/13 0824 Last data filed at 06/10/13 0700  Gross per 24 hour  Intake 1384.8 ml  Output     85 ml  Net 1299.8 ml   EKG:  NSR 61bpm, TWI anterior lateral leads  ASSESSMENT AND PLAN:  Active Problems:   Cardiac arrest   Acute respiratory failure with hypoxia   Cardiogenic shock   Cardiogenic pulmonary edema  S/p Cardiac Arrest and NSTEMI complicated by shock--possibly secondary to clot embolization into D1 vs. stent thrombosis. Recent PCI of SVG to LAD requiring retrieval of thrombus one week ago at [facility], also noted to have widely patent LCx stent, and insignificant RCA disease and SVG feeds D1 only as mid LAD is occluded past graft insertion. Trop up to 1.54. Not cooled.  -continue heparin gtt and amio -continue aspirin and plavix  -start statin -echo today (Echo at [facility] in January with normal function)  -continue to cycle cardiac enzymes -on levo, titrate down as tolerated  Acute respiratory failure in setting of pulmonary edema ?aspiration pna--on mechanical ventilation, paralyzed, and sedated. CXR shows mild  improvement in edema -PCCM following  Hypocalcemia--Ca 6.5. -replaced -check albumin  Leukocytosis--wbc trending up, likely reactive in setting of cardiac arrest and shock -continue to trend.   Case discussed and patient seen with Dr. [place]  Signed: Darden Palmer, MD PGY-2, Internal Medicine Resident Pager: [phone]  06/10/2013,8:36 AM Patient seen and examined and history reviewed. Agree with above findings and plan. 58 yo WM with complex cardiac history. S/p single vessel CABG in 2006 with SVG to LAD. S/p DES of LCx in January. Recent DES of SVG to LAD one week ago. LV function that admission is unknown but apparently normal in January. Now with PEA arrest. Etiology unclear. Bedside Echo in ED  suggested poor LV function. Will check Echo today. Ecg shows T Wave inversion anteriorly and troponin mildly elevated. ? Stent thrombosis. Will check P2Y12 assay. Suspect noncompliance with meds given history of heavy ETOH abuse. Will need repeat cath if recovers neurologically. IV diuresis. CCM managing vent.   Ander Slade [facility] 06/10/2013 10:26 AM

## 2013-06-10 NOTE — Progress Notes (Addendum)
ANTICOAGULATION CONSULT NOTE - Initial Consult  Pharmacy Consult for Heparin  Indication: chest pain/ACS, cardiac arrest  Allergies not on file  Patient Measurements: Height: 5\' 9"  (175.3 cm) Weight: 180 lb (81.647 kg) IBW/kg (Calculated) : 70.7  Vital Signs: BP: 103/69 mmHg (05/29 0008) Pulse Rate: 88 (05/28 2330)  Labs:  Recent Labs  06/09/13 2237 06/09/13 2245  HGB 12.0* 13.6  HCT 36.3* 40.0  PLT 296  --   APTT 39*  --   LABPROT 15.1  --   INR 1.22  --   CREATININE 1.41* 1.50*    Estimated Creatinine Clearance: 52.4 ml/min (by C-G formula based on Cr of 1.5).  Medical History: Past Medical History  Diagnosis Date  . Acute MI    Assessment: 58 y/o M unwitnessed cardiac arrest with ROSC after ACLS to start heparin per pharmacy. Received 2L of cold saline with plans for arctic sun protocol but is not currently being cooled. CBC ok, Scr 1.5, other labs as above.   Goal of Therapy:  Heparin level 0.3-0.7 units/ml Monitor platelets by anticoagulation protocol: Yes   Plan:  -Heparin 4000 units BOLUS -Start heparin drip at 800 units/hr>>will need to be lowered at the initiation of arctic sun protocol to ~650 units/hr -0900 HL -Daily CBC/HL -Monitor for bleeding  Narda Bonds 06/10/2013,12:40 AM  Addendum 12:54 AM Not likely to proceed with arctic sun protocol Sharlynn Oliphant, PharmD

## 2013-06-10 NOTE — ED Notes (Signed)
Pt. Transported to 2900. Uneventful.

## 2013-06-10 NOTE — Care Management Note (Addendum)
    Page 1 of 2   06/20/2013     11:16:13 AM CARE MANAGEMENT NOTE 06/20/2013  Patient:  [name], [name]   Account Number:  [id number]  Date Initiated:  06/10/2013  Documentation initiated by:  Elissa Hefty  Subjective/Objective Assessment:   adm w cardiac arrest  vent     Action/Plan:   lives w wife   Anticipated DC Date:     Anticipated DC Plan:        DC Planning Services  CM consult  Medication [facility]      Choice offered to / List presented to:  C-1 Patient   DME arranged  [facility] - [place]      DME agency  OTHER - SEE NOTE  [place].        Status of service:  Completed, signed off Medicare Important Message given?  NO (If response is "NO", the following Medicare IM given date fields will be blank) Date Medicare IM given:   Date Additional Medicare IM given:    Discharge Disposition:  HOME/SELF CARE  Per UR Regulation:  Reviewed for med. necessity/level of care/duration of stay  If discussed at [place] of Stay Meetings, dates discussed:   06/14/2013    Comments:  06-17-13 1509 Jacqlyn Krauss, RN,BSN [phone] Life vest to be delivered via zoll. Pt to be fit on Saturday for life vest. PT/OT consulted pt and receommended [facility]. Pt is a self pay and will not be able to get [facility] services due to not having a qualifying dx. will have an out of pocket cost. CM will call the [facility] for availability for self pay patients and if pt will have a sliding scale fee. CM did send Epic referral to [facility]. No further needs from CM at this time.    6/2 0956 debbie dowell rn,bsn gave pt 30day free brilinta card. no ins at present. brilinta pt assist form on shadow chart for md to sign. pt has pcp but did leave [place] [facility] resorce list. gave pt prescription discount card that may assist w brand name meds.

## 2013-06-10 NOTE — Progress Notes (Signed)
Nurse practitioner Eddie Dibbles tried to place femoral line unsucessful Dr Chase Caller aware.

## 2013-06-10 NOTE — Progress Notes (Signed)
INITIAL NUTRITION ASSESSMENT  DOCUMENTATION CODES Per approved criteria  -Not Applicable   INTERVENTION: If prolonged intubation expected, recommend initiation of nutrition support. If enteral nutrition desired, recommend initiation of Vital AF 1.2 at 20 ml/hr, advance by 10 ml q 4 hours, to goal of 70 ml/hr. Goal regimen will provide: 2016 kcal, 126 grams protein, 1362 ml free water. RD to continue to follow nutrition care plan.  NUTRITION DIAGNOSIS: Inadequate oral intake related to inability to eat as evidenced by NPO status.   Goal: Intake to meet >90% of estimated nutrition needs.  Monitor:  weight trends, lab trends, I/O's, initiation of nutrition support, vent status  Reason for Assessment: Ventilator + Low Braden Score  58 y.o. male  Admitting Dx: s/p cardiac arrest  ASSESSMENT: PMHx significant for MI. Admitted after collapse and PEA arrest at home.   Patient is currently intubated on ventilator support MV: 11.6 L/min Temp (24hrs), Avg:98 F (36.7 C), Min:96.8 F (36 C), Max:100.2 F (37.9 C)  Propofol: none  Brief physical assessment reveals no significant fat or muscle mass wasting.  Potassium and magnesium WNL Phosphorus low at 2.2  Height: Ht Readings from Last 1 Encounters:  06/09/13 5\' 9"  (1.753 m)    Weight: Wt Readings from Last 1 Encounters:  06/10/13 191 lb 2.2 oz (86.7 kg)  Admit wt is 180 lb  Ideal Body Weight: 160 lb  % Ideal Body Weight: 119%  Wt Readings from Last 10 Encounters:  06/10/13 191 lb 2.2 oz (86.7 kg)    Usual Body Weight: n/a  % Usual Body Weight: n/a  BMI:  Body mass index is 28.21 kg/(m^2).  Estimated Nutritional Needs: (using admit wt of 180 lb) Kcal: 2031  Protein: at least 98 g daily Fluid: approx 2 liters daily  Skin:  R arm laceration L arm skin tear  Diet Order: NPO  EDUCATION NEEDS: -No education needs identified at this time   Intake/Output Summary (Last 24 hours) at 06/10/13 1526 Last data  filed at 06/10/13 1200  Gross per 24 hour  Intake 1812.35 ml  Output    235 ml  Net 1577.35 ml    Last BM: PTA  Labs:   Recent Labs Lab 06/10/13 0010 06/10/13 0418 06/10/13 0744 06/10/13 1115  NA 122*  --  137 138  K 4.8  --  4.9 4.7  CL 89*  --  104 103  CO2 16*  --  21 22  BUN 15  --  23 21  CREATININE 1.05  --  1.35 1.27  CALCIUM 6.5*  --  7.3* 7.4*  MG  --  2.0  --   --   PHOS  --  2.2*  --   --   GLUCOSE 729*  --  271* 185*    CBG (last 3)   Recent Labs  06/10/13 0421 06/10/13 0736 06/10/13 1203  GLUCAP 277* 248* 172*    Scheduled Meds: . ampicillin-sulbactam (UNASYN) IV  3 g Intravenous Q6H  . antiseptic oral rinse  15 mL Mouth Rinse 6 times per day  . artificial tears  1 application Both Eyes 3 times per day  . aspirin  325 mg Oral Daily  . atorvastatin  80 mg Oral q1800  . chlorhexidine  15 mL Mouth/Throat BID  . cisatracurium  8 mg Intravenous Once  . [START ON 06/11/2013] clopidogrel  75 mg Oral Q breakfast  . insulin aspart  2-6 Units Subcutaneous 6 times per day  . midazolam      .  pantoprazole (PROTONIX) IV  40 mg Intravenous QHS  . thiamine  100 mg Oral Daily   Or  . thiamine IV  100 mg Intravenous Daily    Continuous Infusions: . amiodarone 30 mg/hr (06/10/13 1356)  . cisatracurium (NIMBEX) infusion 3.5 mcg/kg/min (06/10/13 1357)  . fentaNYL infusion INTRAVENOUS 400 mcg/hr (06/10/13 1159)  . heparin 1,100 Units/hr (06/10/13 1357)  . midazolam (VERSED) infusion 8 mg/hr (06/10/13 0930)  . norepinephrine (LEVOPHED) Adult infusion 30 mcg/min (06/10/13 1017)    Past Medical History  Diagnosis Date  . Acute MI     Past Surgical History  Procedure Laterality Date  . Angioplasty      Inda Coke MS, RD, LDN Inpatient Registered Dietitian Pager: [phone] After-hours pager: [phone]

## 2013-06-10 NOTE — Progress Notes (Signed)
ANTIBIOTIC CONSULT NOTE - INITIAL  Pharmacy Consult for Unasyn Indication: aspiration PNA  No Known Allergies  Patient Measurements: Height: 5\' 9"  (175.3 cm) Weight: 191 lb 2.2 oz (86.7 kg) IBW/kg (Calculated) : 70.7  Vital Signs: Temp: 98.8 F (37.1 C) (05/29 0900) Temp src: Core (Comment) (05/29 0215) BP: 104/70 mmHg (05/29 0900) Pulse Rate: 64 (05/29 0900) Intake/Output from previous day: 05/28 0701 - 05/29 0700 In: 1398.2 [I.V.:1098.2; IV Piggyback:300] Out: 85 [Urine:85] Intake/Output from this shift: Total I/O In: 87.5 [I.V.:87.5] Out: 150 [Urine:150]  Labs:  Recent Labs  06/09/13 2237 06/09/13 2245 06/10/13 0010 06/10/13 0744  WBC 17.1*  --  24.0*  --   HGB 12.0* 13.6 11.5*  --   PLT 296  --  314  --   CREATININE 1.41* 1.50* 1.05 1.35   Estimated Creatinine Clearance: 63.5 ml/min (by C-G formula based on Cr of 1.35). No results found for this basename: VANCOTROUGH, Corlis Leak, VANCORANDOM, [place], GENTPEAK, GENTRANDOM, TOBRATROUGH, TOBRAPEAK, TOBRARND, AMIKACINPEAK, AMIKACINTROU, AMIKACIN,  in the last 72 hours   Microbiology: Recent Results (from the past 720 hour(s))  MRSA PCR SCREENING     Status: None   Collection Time    06/10/13  5:44 AM      Result Value Ref Range Status   MRSA by PCR NEGATIVE  NEGATIVE Final   Comment:            The GeneXpert MRSA Assay (FDA     approved for NASAL specimens     only), is one component of a     comprehensive MRSA colonization     surveillance program. It is not     intended to diagnose MRSA     infection nor to guide or     monitor treatment for     MRSA infections.    Medical History: Past Medical History  Diagnosis Date  . Acute MI     Medications:  Scheduled:  . ampicillin-sulbactam (UNASYN) IV  3 g Intravenous Q6H  . antiseptic oral rinse  15 mL Mouth Rinse 6 times per day  . artificial tears  1 application Both Eyes 3 times per day  . aspirin  325 mg Oral Daily  . atorvastatin  40 mg  Oral q1800  . chlorhexidine  15 mL Mouth/Throat BID  . cisatracurium  8 mg Intravenous Once  . [START ON 06/11/2013] clopidogrel  75 mg Oral Q breakfast  . insulin aspart  0-9 Units Subcutaneous 6 times per day  . midazolam      . pantoprazole (PROTONIX) IV  40 mg Intravenous QHS  . thiamine  100 mg Oral Daily   Or  . thiamine IV  100 mg Intravenous Daily   Infusions:  . amiodarone 30 mg/hr (06/10/13 0700)  . cisatracurium (NIMBEX) infusion 3 mcg/kg/min (06/10/13 0700)  . fentaNYL infusion INTRAVENOUS 200 mcg/hr (06/10/13 0700)  . heparin 800 Units/hr (06/10/13 0700)  . midazolam (VERSED) infusion 8 mg/hr (06/10/13 0930)  . norepinephrine (LEVOPHED) Adult infusion 30 mcg/min (06/10/13 1017)   Assessment: 58 y/o M with significant cardiac history s/p unwitnessed cardiac arrest. He had ROSC with EMS and was intubated in the field. Upon arrival to the ER, he coded an additional two times with subsequent ROSC. Patient received 2L of cold saline with plans for arctic sun protocol, but patient was ultimately not cooled.  Patient is still on ventilator, sedated, and paralyzed this morning, but as mentioned, not undergoing therapeutic hypothermia.  There is concern for possible aspiration  PNA, and pharmacy is consulted to start Unasyn.  WBC is up to 24, and patient is currently afebrile.  SCr is down to 1.27 with a CrCl ~68.  Unasyn IV 5/19>>  5/29 urine cx >> pending 5/29 blood cx >> pending 5/29 MRSA pcr negative  Goal of Therapy:  Resolution of possible infection  Plan:  - start Unasyn IV 3g q6h - monitor kidney function, WBC, temperature curve, any cultures, and clinical progression  Ovid Curd E. Jacqlyn Larsen, PharmD Clinical Pharmacist - Resident Pager: [phone] Pharmacy: [phone] 06/10/2013 12:49 PM

## 2013-06-10 NOTE — Procedures (Signed)
PROCEDURE NOTE: CVL PLACEMENT  INDICATION:    Monitoring of central venous pressures and/or administration of medications optimally administered in central vein  CONSENT:   Performed emergently. A time out was performed to review patient identification, procedure to be performed, correct patient position, medications/allergies/relevent history, required imaging and test results.  PROCEDURE  Maximum sterile technique was used including antiseptics, cap, gloves, gown, hand hygiene, mask and sheet.  Skin prep: Chlorhexidine; local anesthetic administered  A antimicrobial bonded/coated triple lumen catheter was placed in the L [place] CVL using the Seldinger technique.   EVALUATION:  Blood flow good  Complications: No apparent complications  Patient tolerated the procedure well.  Chest X-ray revealed adequate placement and no PTX   Merton Border, MD PCCM service Mobile [phone]

## 2013-06-10 NOTE — ED Notes (Signed)
Family at bedside. 

## 2013-06-10 NOTE — ED Notes (Signed)
Critical care medicine at bedside.  CCM updated family.

## 2013-06-10 NOTE — Progress Notes (Signed)
ANTICOAGULATION CONSULT NOTE - Follow Up Consult  Pharmacy Consult for heparin gtt Indication: chest pain/ACS  No Known Allergies  Patient Measurements: Height: 5\' 9"  (175.3 cm) Weight: 191 lb 2.2 oz (86.7 kg) IBW/kg (Calculated) : 70.7 Heparin Dosing Weight: 86.7 kg  Vital Signs: Temp: 100.4 F (38 C) (05/29 1700) Temp src: Oral (05/29 1200) BP: 91/56 mmHg (05/29 1800) Pulse Rate: 72 (05/29 1800)  Labs:  Recent Labs  06/09/13 2237 06/09/13 2245 06/10/13 0010 06/10/13 0744 06/10/13 1000 06/10/13 1115 06/10/13 1119 06/10/13 1800 06/10/13 1828  HGB 12.0* 13.6 11.5*  --   --   --   --   --   --   HCT 36.3* 40.0 35.3*  --   --   --   --   --   --   PLT 296  --  314  --   --   --   --   --   --   APTT 39*  --   --   --   --   --   --   --   --   LABPROT 15.1  --   --   --   --   --   --   --   --   INR 1.22  --   --   --   --   --   --   --   --   HEPARINUNFRC  --   --   --   --  0.12*  --   --   --  0.36  CREATININE 1.41* 1.50* 1.05 1.35  --  1.27  --  1.15  --   TROPONINI  --   --  1.54*  --   --   --  >20.00* >20.00*  --     Estimated Creatinine Clearance: 74.5 ml/min (by C-G formula based on Cr of 1.15).   Medications:  Heparin @ 1100 units/hr  Assessment: 58 y/o M with significant cardiac history s/p unwitnessed cardiac arrest. He had ROSC with EMS and was intubated in the field.  Upon arrival to the ER, he coded an additional two times with subsequent ROSC.  Heparin level this morning was low and patient was re-bolused and rate was increased. This evening, heparin level is within therapeutic range at 0.36units/mL. No bleeding noted.   Goal of Therapy:  Heparin level 0.3-0.7 units/ml Monitor platelets by anticoagulation protocol: Yes   Plan:  1. Continue heparin at 1100 units/hr 2. 6h HL at 0100 to confirm 3. Daily HL and CBC 4. Monitor for s/s of bleeding  Aldean Suddeth D. Reanna Scoggin, PharmD, BCPS Clinical Pharmacist Pager: [phone] 06/10/2013 7:05 PM

## 2013-06-10 NOTE — Progress Notes (Signed)
  Echocardiogram 2D Echocardiogram has been performed.  Basilia Jumbo 06/10/2013, 10:10 AM

## 2013-06-10 NOTE — Progress Notes (Signed)
Transported patient from emergency room to unit [id number]. Patient transferred on ventilator with 100% oxygen. Patient remained hemodynamically stable during transport. \

## 2013-06-10 NOTE — ED Notes (Signed)
Vital signs stable. 

## 2013-06-10 NOTE — Progress Notes (Addendum)
PULMONARY / CRITICAL CARE MEDICINE   Name: [name] MRN: [id number] DOB: 1955/10/16    ADMISSION DATE:  06/09/2013 CONSULTATION DATE:  06/09/2013  REFERRING MD :  EDP PRIMARY SERVICE: PCCM  CHIEF COMPLAINT:  Cardiac arrest  BRIEF PATIENT DESCRIPTION:  58 yo with significant cardiac history, s/p CABG and recent PTCA / stent ( rethrombosed ). Brought to [facility] ED 5/28 after collapse and PEA arrest at home. Unknown downtime, CPR by family x 5 minutes and ACLS x 15 minutes with ROSC.  Repeated cardiac arrest in ED x 2.   SIGNIFICANT EVENTS / STUDIES:  5/19  MI with PTCA at [facility], stent thrombosis 5/28  PEA arrest, decision against cardiac cath and hypothermia 5/28  Refractory hypoxemia, Nimbex started  LINES / TUBES: OETT 5/29 >>> OGT 5/29 >>> L [place] CVL 5/29 >>> Foley 5/29 >>>  CULTURES: Blood 5/29 >>> Urine 5/29 >>>  ANTIBIOTICS: Unasyn 5/29 >>>  INTERVAL HISTORY: Nimbex started for refractory hypoxemia.  VITAL SIGNS: Temp:  [96.8 F (36 C)-98.8 F (37.1 C)] 98.8 F (37.1 C) (05/29 0900) Pulse Rate:  [55-89] 64 (05/29 0900) Resp:  [20-29] 20 (05/29 0900) BP: (83-127)/(45-95) 104/70 mmHg (05/29 0900) SpO2:  [73 %-100 %] 100 % (05/29 0900) FiO2 (%):  [80 %-100 %] 80 % (05/29 0724) Weight:  [81.647 kg (180 lb)-86.7 kg (191 lb 2.2 oz)] 86.7 kg (191 lb 2.2 oz) (05/29 0432)  HEMODYNAMICS: CVP:  [9 mmHg] 9 mmHg  VENTILATOR SETTINGS: Vent Mode:  [-] PRVC FiO2 (%):  [80 %-100 %] 80 % Set Rate:  [20 bmp-25 bmp] 20 bmp Vt Set:  [570 mL-600 mL] 570 mL PEEP:  [5 cmH20-14 cmH20] 10 cmH20 Plateau Pressure:  [25 cmH20-32 cmH20] 25 cmH20 INTAKE / OUTPUT: Intake/Output     05/28 0701 - 05/29 0700 05/29 0701 - 05/30 0700   I.V. (mL/kg) 1098.2 (12.7) 87.5 (1)   IV Piggyback 300    Total Intake(mL/kg) 1398.2 (16.1) 87.5 (1)   Urine (mL/kg/hr) 85 150 (0.6)   Total Output 85 150   Net +1313.2 -62.5         PHYSICAL EXAMINATION: General:  Appears acutely ill, mechanically  ventilated, synchronous Neuro:  Sedated / paralyzed, nonfocal, cough / gag diminished HEENT:  PERRL, OETT / OGT Cardiovascular:  RRR, no m/r/g Lungs:  Bilateral diminished air entry, rales Abdomen:  Soft, nontender, bowel sounds diminished Musculoskeletal:  Moves all extremities, no edema Skin:  Intact  LABS:  CBC  Recent Labs Lab 06/09/13 2237 06/09/13 2245 06/10/13 0010  WBC 17.1*  --  24.0*  HGB 12.0* 13.6 11.5*  HCT 36.3* 40.0 35.3*  PLT 296  --  314   Coag's  Recent Labs Lab 06/09/13 2237  APTT 39*  INR 1.22   BMET  Recent Labs Lab 06/09/13 2237 06/09/13 2245 06/10/13 0010 06/10/13 0744  NA 141 144 122* 137  K 3.6* 3.4* 4.8 4.9  CL 105 104 89* 104  CO2 16*  --  16* 21  BUN 17 19 15 23   CREATININE 1.41* 1.50* 1.05 1.35  GLUCOSE 173* 168* 729* 271*   Electrolytes  Recent Labs Lab 06/09/13 2237 06/10/13 0010 06/10/13 0418 06/10/13 0744  CALCIUM 7.4* 6.5*  --  7.3*  MG  --   --  2.0  --   PHOS  --   --  2.2*  --    Sepsis Markers  Recent Labs Lab 06/09/13 2245 06/10/13 0010  LATICACIDVEN 7.64* 6.1*   ABG  Recent  Labs Lab 06/09/13 2248 06/10/13 0012 06/10/13 0326  PHART 7.076* 7.063* 7.302*  PCO2ART 56.8* 62.1* 33.7*  PO2ART 58.0* 56.0* 118.0*   Liver Enzymes No results found for this basename: AST, ALT, ALKPHOS, BILITOT, ALBUMIN,  in the last 168 hours  Cardiac Enzymes  Recent Labs Lab 06/10/13 0010  TROPONINI 1.54*   Glucose  Recent Labs Lab 06/09/13 2257 06/10/13 0421 06/10/13 0736  GLUCAP 169* 277* 248*   IMAGING:  Dg Chest Portable 1 View  06/10/2013   CLINICAL DATA:  Cardiac arrest, history acute MI  EXAM: PORTABLE CHEST - 1 VIEW  COMPARISON:  Portable exam 0102 hr compared to 06/09/2013  FINDINGS: Tip of endotracheal tube projects approximately 6.1 cm above carina.  Nasogastric tube extends into stomach.  RIGHT subclavian central venous catheter with tip projecting over SVC.  Normal heart size post CABG.   Pulmonary vascular congestion.  Pulmonary infiltrates in the perihilar upper lobe regions compatible pulmonary edema, slightly decreased since previous exam.  External pacing leads in numerous EKG leads present.  No gross pneumothorax.  IMPRESSION: Slightly improved pulmonary edema.  No pneumothorax following LEFT subclavian line placement.   Electronically Signed   By: Lavonia Dana M.D.   On: 06/10/2013 02:05   Dg Chest Port 1 View  06/09/2013   CLINICAL DATA:  Hypertension, intubation, post code  EXAM: PORTABLE CHEST - 1 VIEW  COMPARISON:  Portable exam 2218 hr compared to 07/19/2008  FINDINGS: Tip of endotracheal tube is poorly visualized but estimated at 4 cm above carina.  Enlargement of cardiac silhouette with pulmonary vascular congestion.  Diffuse pulmonary edema.  External pacing leads in EKG leads.  Postsurgical changes of CABG.  No gross pleural effusion or pneumothorax.  IMPRESSION: Diffuse pulmonary edema.   Electronically Signed   By: Lavonia Dana M.D.   On: 06/09/2013 22:41   ASSESSMENT / PLAN:  PULMONARY A: Acute respiratory failure in setting of cardiac arrest Pulmonary edema Possible aspiration pneumonia P:   Goal pH>7.28, SpO2>88, PLP<30, VT < 7 cc/kg Continuous mechanical support VAP bundle Defer SBT Trend ABG/CXR Albuterol PRN  CARDIOVASCULAR A:  NSTEMI S/p PEA arrest Cardiogenic shock Probable acute on chronic systolic / diastolic congestive heart failure P:  Goal MAP > 65 Cardiology following Levophed gtt Amiodarone gtt, do we need it? Trend lactate / troponin TTE Add ASA, Lipitor, Plavix BB/ACEI contraindicated - shock Reconsider cardiac cath?  RENAL A:   AKI Fluid overload P:   Trend BMP  Defer diuresis as significant vasopressor requirements  GASTROINTESTINAL A:   GI Px Nutrition P:   Protonix  HEMATOLOGIC A:   Heparinization for ACS Anemia P:  Heparin gtt per Pharmacy  INFECTIOUS A:   Possible aspiration pneumonia vs stress  responce P:   Start Unasyn PCT  ENDOCRINE A:   Hyperglycemia  P:   SSI  NEUROLOGIC A:   Acute encephalopathy Vent dyssynchrony  P:   RASS goal: -4 to -5 Versed / Fentanyl / Nimbex  Defer WUA Deemed not to be a candidate for hypothermia as neurologically intact post arrest   I have personally obtained history, examined patient, evaluated and interpreted laboratory and imaging results, reviewed medical records, formulated assessment / plan and placed orders.  CRITICAL CARE:  The patient is critically ill with multiple organ systems failure and requires high complexity decision making for assessment and support, frequent evaluation and titration of therapies, application of advanced monitoring technologies and extensive interpretation of multiple databases. Critical Care Time devoted to patient care  services described in this note is 50 minutes.   Doree Fudge, MD Pulmonary and [place] Pager: [phone]  06/10/2013, 10:12 AM

## 2013-06-11 ENCOUNTER — Inpatient Hospital Stay (HOSPITAL_COMMUNITY): Payer: Medicaid Other

## 2013-06-11 LAB — CBC
HEMATOCRIT: 33 % — AB (ref 39.0–52.0)
HEMOGLOBIN: 11.2 g/dL — AB (ref 13.0–17.0)
MCH: 31.6 pg (ref 26.0–34.0)
MCHC: 33.9 g/dL (ref 30.0–36.0)
MCV: 93.2 fL (ref 78.0–100.0)
Platelets: 286 10*3/uL (ref 150–400)
RBC: 3.54 MIL/uL — ABNORMAL LOW (ref 4.22–5.81)
RDW: 14.7 % (ref 11.5–15.5)
WBC: 17.3 10*3/uL — ABNORMAL HIGH (ref 4.0–10.5)

## 2013-06-11 LAB — HEPARIN LEVEL (UNFRACTIONATED)
Heparin Unfractionated: 0.34 IU/mL (ref 0.30–0.70)
Heparin Unfractionated: 0.44 IU/mL (ref 0.30–0.70)

## 2013-06-11 LAB — BASIC METABOLIC PANEL
BUN: 14 mg/dL (ref 6–23)
BUN: 15 mg/dL (ref 6–23)
CHLORIDE: 104 meq/L (ref 96–112)
CHLORIDE: 105 meq/L (ref 96–112)
CO2: 23 mEq/L (ref 19–32)
CO2: 24 mEq/L (ref 19–32)
CREATININE: 1.14 mg/dL (ref 0.50–1.35)
Calcium: 7.3 mg/dL — ABNORMAL LOW (ref 8.4–10.5)
Calcium: 7.4 mg/dL — ABNORMAL LOW (ref 8.4–10.5)
Creatinine, Ser: 1.17 mg/dL (ref 0.50–1.35)
GFR calc Af Amer: 79 mL/min — ABNORMAL LOW (ref >90)
GFR calc non Af Amer: 68 mL/min — ABNORMAL LOW (ref >90)
GFR, EST AFRICAN AMERICAN: 77 mL/min — AB (ref >90)
GFR, EST NON AFRICAN AMERICAN: 66 mL/min — AB (ref >90)
Glucose, Bld: 131 mg/dL — ABNORMAL HIGH (ref 70–99)
Glucose, Bld: 133 mg/dL — ABNORMAL HIGH (ref 70–99)
POTASSIUM: 3.9 meq/L (ref 3.7–5.3)
Potassium: 3.7 mEq/L (ref 3.7–5.3)
Sodium: 137 mEq/L (ref 137–147)
Sodium: 139 mEq/L (ref 137–147)

## 2013-06-11 LAB — PROCALCITONIN: Procalcitonin: 1.65 ng/mL

## 2013-06-11 LAB — GLUCOSE, CAPILLARY
GLUCOSE-CAPILLARY: 148 mg/dL — AB (ref 70–99)
GLUCOSE-CAPILLARY: 97 mg/dL (ref 70–99)
Glucose-Capillary: 100 mg/dL — ABNORMAL HIGH (ref 70–99)
Glucose-Capillary: 132 mg/dL — ABNORMAL HIGH (ref 70–99)
Glucose-Capillary: 126 mg/dL — ABNORMAL HIGH (ref 70–99)

## 2013-06-11 LAB — URINE CULTURE
CULTURE: NO GROWTH
Colony Count: NO GROWTH

## 2013-06-11 LAB — PLATELET INHIBITION P2Y12: PLATELET FUNCTION P2Y12: 246 [PRU] (ref 194–418)

## 2013-06-11 LAB — MAGNESIUM: MAGNESIUM: 1.6 mg/dL (ref 1.5–2.5)

## 2013-06-11 LAB — TROPONIN I

## 2013-06-11 LAB — LACTIC ACID, PLASMA: LACTIC ACID, VENOUS: 1.7 mmol/L (ref 0.5–2.2)

## 2013-06-11 MED ORDER — AMIODARONE HCL 200 MG PO TABS
200.0000 mg | ORAL_TABLET | Freq: Two times a day (BID) | ORAL | Status: DC
  Administered 2013-06-11 (×2): 200 mg
  Filled 2013-06-11 (×4): qty 1

## 2013-06-11 MED ORDER — ASPIRIN 81 MG PO CHEW: 81.0000 mg | CHEWABLE_TABLET | Freq: Once | ORAL | Status: AC

## 2013-06-11 MED ORDER — TICAGRELOR 90 MG PO TABS
90.0000 mg | ORAL_TABLET | Freq: Two times a day (BID) | ORAL | Status: DC
  Administered 2013-06-11 – 2013-06-20 (×18): 90 mg via ORAL
  Filled 2013-06-11 (×19): qty 1

## 2013-06-11 MED ORDER — ASPIRIN 81 MG PO CHEW
81.0000 mg | CHEWABLE_TABLET | Freq: Every day | ORAL | Status: DC
  Administered 2013-06-12 – 2013-06-20 (×8): 81 mg
  Filled 2013-06-11 (×8): qty 1

## 2013-06-11 MED ORDER — MAGNESIUM SULFATE 40 MG/ML IJ SOLN
2.0000 g | Freq: Once | INTRAMUSCULAR | Status: AC
  Administered 2013-06-11: 2 g via INTRAVENOUS
  Filled 2013-06-11: qty 50

## 2013-06-11 MED ORDER — FUROSEMIDE 10 MG/ML IJ SOLN
40.0000 mg | Freq: Once | INTRAMUSCULAR | Status: AC
  Administered 2013-06-11: 40 mg via INTRAVENOUS

## 2013-06-11 MED ORDER — ACETAMINOPHEN 160 MG/5ML PO SOLN
650.0000 mg | Freq: Four times a day (QID) | ORAL | Status: DC | PRN
  Administered 2013-06-11 – 2013-06-12 (×3): 650 mg
  Filled 2013-06-11 (×3): qty 20.3

## 2013-06-11 MED ORDER — TICAGRELOR 90 MG PO TABS
180.0000 mg | ORAL_TABLET | Freq: Once | ORAL | Status: AC
  Administered 2013-06-11: 180 mg via ORAL
  Filled 2013-06-11: qty 2

## 2013-06-11 NOTE — Progress Notes (Signed)
ANTICOAGULATION CONSULT NOTE - Initial Consult  Pharmacy Consult for Heparin  Indication: chest pain/ACS, cardiac arrest  No Known Allergies  Patient Measurements: Height: 5\' 9"  (175.3 cm) Weight: 191 lb 2.2 oz (86.7 kg) IBW/kg (Calculated) : 70.7  Vital Signs: Temp: 100.4 F (38 C) (05/29 2300) BP: 92/55 mmHg (05/29 2300) Pulse Rate: 75 (05/29 2311)  Labs:  Recent Labs  06/09/13 2237 06/09/13 2245  06/10/13 0010  06/10/13 1000 06/10/13 1115 06/10/13 1119 06/10/13 1800 06/10/13 1828 06/11/13 0005  HGB 12.0* 13.6  --  11.5*  --   --   --   --   --   --   --   HCT 36.3* 40.0  --  35.3*  --   --   --   --   --   --   --   PLT 296  --   --  314  --   --   --   --   --   --   --   APTT 39*  --   --   --   --   --   --   --   --   --   --   LABPROT 15.1  --   --   --   --   --   --   --   --   --   --   INR 1.22  --   --   --   --   --   --   --   --   --   --   HEPARINUNFRC  --   --   --   --   --  0.12*  --   --   --  0.36 0.44  CREATININE 1.41* 1.50*  --  1.05  < >  --  1.27  --  1.15  --  1.17  TROPONINI  --   --   < > 1.54*  --   --   --  >20.00* >20.00*  --  >20.00*  < > = values in this interval not displayed.  Estimated Creatinine Clearance: 73.2 ml/min (by C-G formula based on Cr of 1.17).  Medical History: Past Medical History  Diagnosis Date  . Acute MI    Assessment: 58 y/o M unwitnessed cardiac arrest with ROSC after ACLS on heparin per pharmacy. HL is therapeutic x 2. Other labs as above.   Goal of Therapy:  Heparin level 0.3-0.7 units/ml Monitor platelets by anticoagulation protocol: Yes   Plan:  -Continue heparin drip at 1100 units/hr -Daily CBC/HL -Monitor for bleeding  [name] 06/11/2013,1:05 AM

## 2013-06-11 NOTE — Progress Notes (Signed)
SUBJECTIVE:  [name] is a 58 y.o. male with PMH CAD, s/p 1v CABG (SVG to LAD in 2006), recent hospital admission at [facility] on 5/20 with NSTEMI and occlusion of SVG to LAD with stent placed 5/21, bilateral carotid stenosis, L subclavian stenosis, renal artery stenosis, ongoing tobacco use, who presents after unwitnessed cardiac arrest at home requring ~15 minutes of CPR and Epi x3 for ROSC with subsequent codes x2 in ED again.  Intubated and sedated on arrival. Hypothermia protocol was initially started but subsequently stopped. Hypotensive requiring pressors.    This morning, remains sedated and paralyzed on vent. Remains on pressors. Family by beside.    PHYSICAL EXAM Filed Vitals:   06/11/13 0400 06/11/13 0500 06/11/13 0600 06/11/13 0700  BP: 92/55 101/59 88/52 93/61   Pulse: 81 80 82 83  Temp: 100.4 F (38 C) 100.4 F (38 C) 100.6 F (38.1 C) 100.6 F (38.1 C)  TempSrc:      Resp: 20 0 20 20  Height:      Weight:  200 lb 13.4 oz (91.1 kg)    SpO2: 98% 99% 99% 99%   General:  Lying in bed, sedated, paralyzed, on mechanical ventilation Lungs:  Coarse b/l breath sounds Heart:  RRR, CVP 9 Abdomen:  Soft, distended, hypoactive bowel sounds Extremities:  Trace edema, cool lower extremities, faint distal pulses better on left foot than right, +tattoos Neuro: Paralyzed and sedated  LABS: Lab Results  Component Value Date   TROPONINI >20.00* 06/11/2013   Results for orders placed during the hospital encounter of 06/09/13 (from the past 24 hour(s))  BASIC METABOLIC PANEL     Status: Abnormal   Collection Time    06/10/13  7:44 AM      Result Value Ref Range   Sodium 137  137 - 147 mEq/L   Potassium 4.9  3.7 - 5.3 mEq/L   Chloride 104  96 - 112 mEq/L   CO2 21  19 - 32 mEq/L   Glucose, Bld 271 (*) 70 - 99 mg/dL   BUN 23  6 - 23 mg/dL   Creatinine, Ser 1.35  0.50 - 1.35 mg/dL   Calcium 7.3 (*) 8.4 - 10.5 mg/dL   GFR calc non Af Amer 56 (*) >90 mL/min   GFR calc Af Amer 64  (*) >90 mL/min  HEPARIN LEVEL (UNFRACTIONATED)     Status: Abnormal   Collection Time    06/10/13 10:00 AM      Result Value Ref Range   Heparin Unfractionated 0.12 (*) 0.30 - 0.70 IU/mL  LACTIC ACID, PLASMA     Status: Abnormal   Collection Time    06/10/13 11:14 AM      Result Value Ref Range   Lactic Acid, Venous 2.9 (*) 0.5 - 2.2 mmol/L  BASIC METABOLIC PANEL     Status: Abnormal   Collection Time    06/10/13 11:15 AM      Result Value Ref Range   Sodium 138  137 - 147 mEq/L   Potassium 4.7  3.7 - 5.3 mEq/L   Chloride 103  96 - 112 mEq/L   CO2 22  19 - 32 mEq/L   Glucose, Bld 185 (*) 70 - 99 mg/dL   BUN 21  6 - 23 mg/dL   Creatinine, Ser 1.27  0.50 - 1.35 mg/dL   Calcium 7.4 (*) 8.4 - 10.5 mg/dL   GFR calc non Af Amer 60 (*) >90 mL/min   GFR calc Af Wyvonnia Lora  69 (*) >90 mL/min  TROPONIN I     Status: Abnormal   Collection Time    06/10/13 11:19 AM      Result Value Ref Range   Troponin I >20.00 (*) <0.30 ng/mL  GLUCOSE, CAPILLARY     Status: Abnormal   Collection Time    06/10/13 12:03 PM      Result Value Ref Range   Glucose-Capillary 172 (*) 70 - 99 mg/dL  GLUCOSE, CAPILLARY     Status: Abnormal   Collection Time    06/10/13  5:09 PM      Result Value Ref Range   Glucose-Capillary 108 (*) 70 - 99 mg/dL  BASIC METABOLIC PANEL     Status: Abnormal   Collection Time    06/10/13  6:00 PM      Result Value Ref Range   Sodium 142  137 - 147 mEq/L   Potassium 4.0  3.7 - 5.3 mEq/L   Chloride 105  96 - 112 mEq/L   CO2 22  19 - 32 mEq/L   Glucose, Bld 127 (*) 70 - 99 mg/dL   BUN 17  6 - 23 mg/dL   Creatinine, Ser 1.15  0.50 - 1.35 mg/dL   Calcium 6.8 (*) 8.4 - 10.5 mg/dL   GFR calc non Af Amer 67 (*) >90 mL/min   GFR calc Af Amer 78 (*) >90 mL/min  LACTIC ACID, PLASMA     Status: Abnormal   Collection Time    06/10/13  6:00 PM      Result Value Ref Range   Lactic Acid, Venous 2.3 (*) 0.5 - 2.2 mmol/L  TROPONIN I     Status: Abnormal   Collection Time    06/10/13   6:00 PM      Result Value Ref Range   Troponin I >20.00 (*) <0.30 ng/mL  HEPARIN LEVEL (UNFRACTIONATED)     Status: None   Collection Time    06/10/13  6:28 PM      Result Value Ref Range   Heparin Unfractionated 0.36  0.30 - 0.70 IU/mL  GLUCOSE, CAPILLARY     Status: Abnormal   Collection Time    06/10/13  7:55 PM      Result Value Ref Range   Glucose-Capillary 123 (*) 70 - 99 mg/dL  GLUCOSE, CAPILLARY     Status: Abnormal   Collection Time    06/10/13 11:58 PM      Result Value Ref Range   Glucose-Capillary 127 (*) 70 - 99 mg/dL  BASIC METABOLIC PANEL     Status: Abnormal   Collection Time    06/11/13 12:05 AM      Result Value Ref Range   Sodium 137  137 - 147 mEq/L   Potassium 3.9  3.7 - 5.3 mEq/L   Chloride 104  96 - 112 mEq/L   CO2 23  19 - 32 mEq/L   Glucose, Bld 133 (*) 70 - 99 mg/dL   BUN 15  6 - 23 mg/dL   Creatinine, Ser 1.17  0.50 - 1.35 mg/dL   Calcium 7.3 (*) 8.4 - 10.5 mg/dL   GFR calc non Af Amer 66 (*) >90 mL/min   GFR calc Af Amer 77 (*) >90 mL/min  LACTIC ACID, PLASMA     Status: None   Collection Time    06/11/13 12:05 AM      Result Value Ref Range   Lactic Acid, Venous 1.7  0.5 - 2.2 mmol/L  TROPONIN  I     Status: Abnormal   Collection Time    06/11/13 12:05 AM      Result Value Ref Range   Troponin I >20.00 (*) <0.30 ng/mL  HEPARIN LEVEL (UNFRACTIONATED)     Status: None   Collection Time    06/11/13 12:05 AM      Result Value Ref Range   Heparin Unfractionated 0.44  0.30 - 0.70 IU/mL  MAGNESIUM     Status: None   Collection Time    06/11/13  4:00 AM      Result Value Ref Range   Magnesium 1.6  1.5 - 2.5 mg/dL  PROCALCITONIN     Status: None   Collection Time    06/11/13  4:00 AM      Result Value Ref Range   Procalcitonin 4.13    BASIC METABOLIC PANEL     Status: Abnormal   Collection Time    06/11/13  4:00 AM      Result Value Ref Range   Sodium 139  137 - 147 mEq/L   Potassium 3.7  3.7 - 5.3 mEq/L   Chloride 105  96 - 112 mEq/L     CO2 24  19 - 32 mEq/L   Glucose, Bld 131 (*) 70 - 99 mg/dL   BUN 14  6 - 23 mg/dL   Creatinine, Ser 1.14  0.50 - 1.35 mg/dL   Calcium 7.4 (*) 8.4 - 10.5 mg/dL   GFR calc non Af Amer 68 (*) >90 mL/min   GFR calc Af Amer 79 (*) >90 mL/min  HEPARIN LEVEL (UNFRACTIONATED)     Status: None   Collection Time    06/11/13  4:00 AM      Result Value Ref Range   Heparin Unfractionated 0.34  0.30 - 0.70 IU/mL  CBC     Status: Abnormal   Collection Time    06/11/13  4:00 AM      Result Value Ref Range   WBC 17.3 (*) 4.0 - 10.5 K/uL   RBC 3.54 (*) 4.22 - 5.81 MIL/uL   Hemoglobin 11.2 (*) 13.0 - 17.0 g/dL   HCT 33.0 (*) 39.0 - 52.0 %   MCV 93.2  78.0 - 100.0 fL   MCH 31.6  26.0 - 34.0 pg   MCHC 33.9  30.0 - 36.0 g/dL   RDW 14.7  11.5 - 15.5 %   Platelets 286  150 - 400 K/uL  PLATELET INHIBITION P2Y12     Status: None   Collection Time    06/11/13  5:38 AM      Result Value Ref Range   Platelet Function  P2Y12 246  194 - 418 PRU    Intake/Output Summary (Last 24 hours) at 06/11/13 0736 Last data filed at 06/11/13 0700  Gross per 24 hour  Intake 3007.77 ml  Output   2395 ml  Net 612.77 ml   ASSESSMENT AND PLAN:  Active Problems:   Cardiac arrest   Acute respiratory failure with hypoxia   Cardiogenic shock   Cardiogenic pulmonary edema  S/p Cardiac Arrest and NSTEMI complicated by shock--possibly secondary to clot embolization into D1 vs. stent thrombosis? Recent PCI of SVG to LAD requiring retrieval of thrombus one week ago at [facility], also noted to have widely patent LCx stent, and insignificant RCA disease and SVG feeds D1 only as mid LAD is occluded past graft insertion. Trop up to 1.54. Not cooled. Echo done 5/29: EF 10-15%, hypokinesis, grade 2 DD. PA  pressure 69mmHg. Of note, echo at [place] on 01/19/13: estimated EF >55%, grade 1 DD. Cardiac enzymes up to >20. Remains on pressors. P2Y12 level 246 -On heparin gtt, amio, aspirin, plavix (may need to change given P2Y12 level), and  statin  -will need to go to cath lab once more stable pending neurological improvement  -wean off Levophed  Acute respiratory failure in setting of pulmonary edema ?aspiration pna--on mechanical ventilation. CXR shows improvement in edema. Leukocytosis trending down. Tmax 100.15F this morning.  -PCCM following, still needing nimbex?  -on Unasyn--day 2  Hypocalcemia--Ca up to 7.4, replaced yesterday.  -check albumin  Alcohol and tobacco use--family reports heavy daily alcohol use and smoking cessation since last week.   Case discussed and patient seen with Dr. Aundra Dubin  Signed: Jerene Pitch, MD PGY-2, Internal Medicine Resident Pager: [phone]  06/11/2013,7:36 AM  Patient seen with resident, agree with the above rest.  1. Cardiac arrest: Admitted with PEA arrest, no definite VT/VF noted.   - Can change to po amiodarone today, probably stop in the future.  2. Anterior MI:  Based on ECG, concern for thrombosis of SVG-LAD graft stent that was placed at [facility] on 5/21.  P2Y12 test with high PRU level.  Family says that he has been taking his Plavix at home, suspect Plavix resistance.  - Change to ticagrelor (load and maintenance).  - Continue ASA 81, statin.  - Possible LHC on Monday if remains stable.  3. Acute systolic CHF: EF 97-35% on echo this admission.  Last echo at [facility] in 1/15 with normal EF.  As above, concern for thrombosis of recent SVG-LAD stent. CVP 9.   - Suspect that we will be able to titrate off norepinephrine today (on 8 currently).   - Check co-ox in am off norepinephrine.  - Will give Lasix 40 mg IV x 1. Hopefully can extubate tomorrow.   Larey Dresser 06/11/2013 1:25 PM

## 2013-06-11 NOTE — Progress Notes (Signed)
PULMONARY / CRITICAL CARE MEDICINE   Name: [name] MRN: [id number] DOB: 1955-03-18    ADMISSION DATE:  06/09/2013 CONSULTATION DATE:  06/09/2013  REFERRING MD :  EDP PRIMARY SERVICE: PCCM  CHIEF COMPLAINT:  Cardiac arrest  BRIEF PATIENT DESCRIPTION:  58 yo with significant cardiac history, s/p CABG and recent PTCA / stent ( rethrombosed ). Brought to [facility] ED 5/28 after collapse and PEA arrest at home. Unknown downtime, CPR by family x 5 minutes and ACLS x 15 minutes with ROSC.  Repeated cardiac arrest in ED x 2.   LINES / TUBES: OETT 5/29 >>> OGT 5/29 >>> L [place] CVL 5/29 >>> Foley 5/29 >>>  CULTURES: Blood 5/29 >>> Urine 5/29 >>>  ANTIBIOTICS: Unasyn 5/29 >>>   SIGNIFICANT EVENTS / STUDIES:  5/19  MI with PTCA at [facility], stent thrombosis 5/28  PEA arrest, decision against cardiac cath and hypothermia 5/28  Refractory hypoxemia, Nimbex started    SUBJECTIVE/OVERNIGHT/INTERVAL HX 06/11/13:  Nimbex continues, on levophed, on deep sedation. Fio2 down to 40%, Daughter at bedside  VITAL SIGNS: Temp:  [98.7 F (37.1 C)-100.6 F (38.1 C)] 100.6 F (38.1 C) (05/30 0700) Pulse Rate:  [61-83] 83 (05/30 0700) Resp:  [0-20] 20 (05/30 0700) BP: (84-116)/(52-68) 93/61 mmHg (05/30 0700) SpO2:  [98 %-100 %] 99 % (05/30 0700) FiO2 (%):  [40 %-60 %] 40 % (05/30 0800) Weight:  [91.1 kg (200 lb 13.4 oz)] 91.1 kg (200 lb 13.4 oz) (05/30 0500)  HEMODYNAMICS: CVP:  [6 mmHg-9 mmHg] 9 mmHg  VENTILATOR SETTINGS: Vent Mode:  [-] PRVC FiO2 (%):  [40 %-60 %] 40 % Set Rate:  [20 bmp] 20 bmp Vt Set:  [570 mL] 570 mL PEEP:  [10 cmH20] 10 cmH20 Plateau Pressure:  [22 cmH20-25 cmH20] 22 cmH20 INTAKE / OUTPUT: Intake/Output     05/29 0701 - 05/30 0700 05/30 0701 - 05/31 0700   I.V. (mL/kg) 2604.8 (28.6) 178.4 (2)   Other 103    NG/GT  60   IV Piggyback 300    Total Intake(mL/kg) 3007.8 (33) 238.4 (2.6)   Urine (mL/kg/hr) 1945 (0.9) 195 (0.7)   Emesis/NG output 450 (0.2)    Total  Output 2395 195   Net +612.8 +43.4         PHYSICAL EXAMINATION: General:  Appears acutely ill, mechanically ventilated, synchronous Neuro:  Sedated / paralyzed, nonfocal, cough / gag diminished. BIS 58 on nimbex and sedation gtt HEENT:  PERRL, OETT / OGT Cardiovascular:  RRR, no m/r/g Lungs:  Bilateral diminished air entry, rales Abdomen:  Soft, nontender, bowel sounds diminished Musculoskeletal:  Moves all extremities, no edema Skin:  Intact  LABS:  PULMONARY  Recent Labs Lab 06/09/13 2235 06/09/13 2245 06/09/13 2248 06/10/13 0012 06/10/13 0326  PHART  --   --  7.076* 7.063* 7.302*  PCO2ART  --   --  56.8* 62.1* 33.7*  PO2ART  --   --  58.0* 56.0* 118.0*  HCO3 20.2  --  16.7* 17.7* 16.8*  TCO2 22 19 18 20 18   O2SAT 39.0  --  77.0 75.0 98.0    CBC  Recent Labs Lab 06/09/13 2237 06/09/13 2245 06/10/13 0010 06/11/13 0400  HGB 12.0* 13.6 11.5* 11.2*  HCT 36.3* 40.0 35.3* 33.0*  WBC 17.1*  --  24.0* 17.3*  PLT 296  --  314 286    COAGULATION  Recent Labs Lab 06/09/13 2237  INR 1.22    CARDIAC   Recent Labs Lab 06/10/13 0010 06/10/13 1119  06/10/13 1800 06/11/13 0005  TROPONINI 1.54* >20.00* >20.00* >20.00*   No results found for this basename: PROBNP,  in the last 168 hours   CHEMISTRY  Recent Labs Lab 06/10/13 0010 06/10/13 0418 06/10/13 0744 06/10/13 1115 06/10/13 1800 06/11/13 0005 06/11/13 0400  NA 122*  --  137 138 142 137 139  K 4.8  --  4.9 4.7 4.0 3.9 3.7  CL 89*  --  104 103 105 104 105  CO2 16*  --  21 22 22 23 24   GLUCOSE 729*  --  271* 185* 127* 133* 131*  BUN 15  --  23 21 17 15 14   CREATININE 1.05  --  1.35 1.27 1.15 1.17 1.14  CALCIUM 6.5*  --  7.3* 7.4* 6.8* 7.3* 7.4*  MG  --  2.0  --   --   --   --  1.6  PHOS  --  2.2*  --   --   --   --   --    Estimated Creatinine Clearance: 76.9 ml/min (by C-G formula based on Cr of 1.14).   LIVER  Recent Labs Lab 06/09/13 2237  INR 1.22     INFECTIOUS  Recent  Labs Lab 06/10/13 1114 06/10/13 1800 06/11/13 0005 06/11/13 0400  LATICACIDVEN 2.9* 2.3* 1.7  --   PROCALCITON  --   --   --  1.65     ENDOCRINE CBG (last 3)   Recent Labs  06/10/13 1955 06/10/13 2358 06/11/13 0759  GLUCAP 123* 127* 97         IMAGING x48h  Dg Chest Port 1 View  06/11/2013   CLINICAL DATA:  Short of breath. Chest congestion. Intubated patient. Evaluate support apparatus.  EXAM: PORTABLE CHEST - 1 VIEW  COMPARISON:  06/10/2013.  FINDINGS: The cardiopericardial silhouette grossly appears within normal limits, unchanged. Defibrillator pads overlie the chest. CABG/ median sternotomy.  Left subclavian central line is unchanged. The endotracheal tube is difficult to visualize but appears unchanged, midway between the clavicles on the carina. Airspace disease has improved, compatible with improving pulmonary edema. Subsegmental atelectasis radiates from the right hilum. Enteric tube remains present in the stomach.  IMPRESSION: 1. Unchanged support apparatus. 2. Improved pulmonary edema.   Electronically Signed   By: Dereck Ligas M.D.   On: 06/11/2013 08:03   Dg Chest Portable 1 View  06/10/2013   CLINICAL DATA:  Cardiac arrest, history acute MI  EXAM: PORTABLE CHEST - 1 VIEW  COMPARISON:  Portable exam 0102 hr compared to 06/09/2013  FINDINGS: Tip of endotracheal tube projects approximately 6.1 cm above carina.  Nasogastric tube extends into stomach.  RIGHT subclavian central venous catheter with tip projecting over SVC.  Normal heart size post CABG.  Pulmonary vascular congestion.  Pulmonary infiltrates in the perihilar upper lobe regions compatible pulmonary edema, slightly decreased since previous exam.  External pacing leads in numerous EKG leads present.  No gross pneumothorax.  IMPRESSION: Slightly improved pulmonary edema.  No pneumothorax following LEFT subclavian line placement.   Electronically Signed   By: Lavonia Dana M.D.   On: 06/10/2013 02:05   Dg Chest  Port 1 View  06/09/2013   CLINICAL DATA:  Hypertension, intubation, post code  EXAM: PORTABLE CHEST - 1 VIEW  COMPARISON:  Portable exam 2218 hr compared to 07/19/2008  FINDINGS: Tip of endotracheal tube is poorly visualized but estimated at 4 cm above carina.  Enlargement of cardiac silhouette with pulmonary vascular congestion.  Diffuse pulmonary edema.  External pacing leads in EKG leads.  Postsurgical changes of CABG.  No gross pleural effusion or pneumothorax.  IMPRESSION: Diffuse pulmonary edema.   Electronically Signed   By: Lavonia Dana M.D.   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   - on pressors, heparin gtt, amio, aspirin plavix (p2y212 level 246)  P:  Goal MAP > 65 Cardiology following - Per cards   - continue On heparin gtt, amio, aspirin, plavix (may need to change given P2Y12 level), and statin   -will need to go to cath lab once more stable pending neurological improvement   -wean off Levophed  BB/ACEI contraindicated - shock  RENAL A:   AKI Fluid overload P:   Trend BMP  Defer diuresis as significant vasopressor requirements  GASTROINTESTINAL A:   GI Px Nutrition P:   Protonix  HEMATOLOGIC A:   Heparinization for ACS Anemia P:  Heparin gtt per Pharmacy PRBC for hgb < 8gm% given MI  INFECTIOUS A:   Possible aspiration pneumonia vs stress responce P:   Start Unasyn PCT  ENDOCRINE A:   Hyperglycemia  P:   SSI  NEUROLOGIC A:   Acute encephalopathy  P:   RASS goal: -4 to -5 Versed / Fentanyl  DC  Nimbex  Defer WUA Deemed not to be a candidate for hypothermia as  neurologically intact post arrest   GLOBAL 06/11/13: expressed to daughter that encephalopathy can be a concern. She stated full code but does not want prolonged mechanical ventilation  I have personally obtained history, examined patient, evaluated and interpreted laboratory and imaging results, reviewed medical records, formulated assessment / plan and placed orders.  CRITICAL CARE:  The patient is critically ill with multiple organ systems failure and requires high complexity decision making for assessment and support, frequent evaluation and titration of therapies, application of advanced monitoring technologies and extensive interpretation of multiple databases. Critical Care Time devoted to patient care services described in this note is 50 minutes.    Dr. Brand Males, M.D., [facility].C.P Pulmonary and Critical Care Medicine Staff Physician [place] [facility] Pager: [phone], If no answer or between  15:00h - 7:00h: call [phone]  06/11/2013 10:35 AM

## 2013-06-11 NOTE — Progress Notes (Signed)
Temp of 102 called to Dr Melvyn Novas. Patient currently on Unasyn. Orders for Tylenol received.

## 2013-06-11 NOTE — Progress Notes (Signed)
ANTICOAGULATION CONSULT NOTE - Initial Consult  Pharmacy Consult for Heparin  Indication: chest pain/ACS, cardiac arrest  No Known Allergies  Patient Measurements: Height: 5\' 9"  (175.3 cm) Weight: 200 lb 13.4 oz (91.1 kg) IBW/kg (Calculated) : 70.7 Heparin Dosing Weight: 89 kg  Vital Signs: Temp: 100.6 F (38.1 C) (05/30 0700) BP: 93/61 mmHg (05/30 0700) Pulse Rate: 83 (05/30 0700)  Labs:  Recent Labs  06/09/13 2237 06/09/13 2245  06/10/13 0010  06/10/13 1119 06/10/13 1800 06/10/13 1828 06/11/13 0005 06/11/13 0400  HGB 12.0* 13.6  --  11.5*  --   --   --   --   --  11.2*  HCT 36.3* 40.0  --  35.3*  --   --   --   --   --  33.0*  PLT 296  --   --  314  --   --   --   --   --  286  APTT 39*  --   --   --   --   --   --   --   --   --   LABPROT 15.1  --   --   --   --   --   --   --   --   --   INR 1.22  --   --   --   --   --   --   --   --   --   HEPARINUNFRC  --   --   --   --   < >  --   --  0.36 0.44 0.34  CREATININE 1.41* 1.50*  --  1.05  < >  --  1.15  --  1.17 1.14  TROPONINI  --   --   < > 1.54*  --  >20.00* >20.00*  --  >20.00*  --   < > = values in this interval not displayed.  Estimated Creatinine Clearance: 76.9 ml/min (by C-G formula based on Cr of 1.14).  Medical History: Past Medical History  Diagnosis Date  . Acute MI    Assessment: 58 y/o M unwitnessed cardiac arrest with ROSC after ACLS on heparin per pharmacy. HL is therapeutic x 3 but has trended down from 0.44 to 0.34. H/H low but stable, plt wnl and no reported s/s bleeding.   Goal of Therapy:  Heparin level 0.3-0.7 units/ml Monitor platelets by anticoagulation protocol: Yes   Plan:  -Increase heparin drip slightly to 1150 units/hr to maintain in goal range -Daily CBC/HL -Monitor for bleeding  Harolyn Rutherford, PharmD Clinical Pharmacist - Resident Pager: [phone] Pharmacy: [phone] 06/11/2013 8:22 AM

## 2013-06-12 ENCOUNTER — Encounter (HOSPITAL_COMMUNITY): Payer: Self-pay | Admitting: *Deleted

## 2013-06-12 ENCOUNTER — Inpatient Hospital Stay (HOSPITAL_COMMUNITY): Payer: Medicaid Other

## 2013-06-12 LAB — CBC WITH DIFFERENTIAL/PLATELET
BASOS ABS: 0 10*3/uL (ref 0.0–0.1)
Basophils Relative: 0 % (ref 0–1)
EOS PCT: 1 % (ref 0–5)
Eosinophils Absolute: 0.1 10*3/uL (ref 0.0–0.7)
HEMATOCRIT: 29.2 % — AB (ref 39.0–52.0)
HEMOGLOBIN: 10 g/dL — AB (ref 13.0–17.0)
Lymphocytes Relative: 23 % (ref 12–46)
Lymphs Abs: 3.1 10*3/uL (ref 0.7–4.0)
MCH: 32.1 pg (ref 26.0–34.0)
MCHC: 34.2 g/dL (ref 30.0–36.0)
MCV: 93.6 fL (ref 78.0–100.0)
MONO ABS: 1.1 10*3/uL — AB (ref 0.1–1.0)
MONOS PCT: 9 % (ref 3–12)
NEUTROS ABS: 9 10*3/uL — AB (ref 1.7–7.7)
Neutrophils Relative %: 67 % (ref 43–77)
Platelets: 248 10*3/uL (ref 150–400)
RBC: 3.12 MIL/uL — ABNORMAL LOW (ref 4.22–5.81)
RDW: 14.8 % (ref 11.5–15.5)
WBC: 13.3 10*3/uL — ABNORMAL HIGH (ref 4.0–10.5)

## 2013-06-12 LAB — COMPREHENSIVE METABOLIC PANEL
ALK PHOS: 119 U/L — AB (ref 39–117)
ALT: 1075 U/L — ABNORMAL HIGH (ref 0–53)
AST: 638 U/L — ABNORMAL HIGH (ref 0–37)
Albumin: 2.3 g/dL — ABNORMAL LOW (ref 3.5–5.2)
BUN: 11 mg/dL (ref 6–23)
CALCIUM: 7.6 mg/dL — AB (ref 8.4–10.5)
CO2: 27 meq/L (ref 19–32)
Chloride: 102 mEq/L (ref 96–112)
Creatinine, Ser: 1.21 mg/dL (ref 0.50–1.35)
GFR calc Af Amer: 73 mL/min — ABNORMAL LOW (ref >90)
GFR calc non Af Amer: 63 mL/min — ABNORMAL LOW (ref >90)
GLUCOSE: 100 mg/dL — AB (ref 70–99)
Potassium: 3.5 mEq/L — ABNORMAL LOW (ref 3.7–5.3)
Sodium: 139 mEq/L (ref 137–147)
Total Bilirubin: 0.9 mg/dL (ref 0.3–1.2)
Total Protein: 4.9 g/dL — ABNORMAL LOW (ref 6.0–8.3)

## 2013-06-12 LAB — HEPARIN LEVEL (UNFRACTIONATED)
HEPARIN UNFRACTIONATED: 0.3 [IU]/mL (ref 0.30–0.70)
Heparin Unfractionated: 0.18 IU/mL — ABNORMAL LOW (ref 0.30–0.70)

## 2013-06-12 LAB — HEMOGLOBIN A1C
HEMOGLOBIN A1C: 6 % — AB (ref <5.7)
MEAN PLASMA GLUCOSE: 126 mg/dL — AB (ref <117)

## 2013-06-12 LAB — MAGNESIUM: Magnesium: 1.9 mg/dL (ref 1.5–2.5)

## 2013-06-12 LAB — GLUCOSE, CAPILLARY
GLUCOSE-CAPILLARY: 111 mg/dL — AB (ref 70–99)
Glucose-Capillary: 162 mg/dL — ABNORMAL HIGH (ref 70–99)
Glucose-Capillary: 47 mg/dL — ABNORMAL LOW (ref 70–99)
Glucose-Capillary: 114 mg/dL — ABNORMAL HIGH (ref 70–99)
Glucose-Capillary: 100 mg/dL — ABNORMAL HIGH (ref 70–99)
Glucose-Capillary: 101 mg/dL — ABNORMAL HIGH (ref 70–99)
Glucose-Capillary: 119 mg/dL — ABNORMAL HIGH (ref 70–99)

## 2013-06-12 LAB — PROCALCITONIN: Procalcitonin: 0.95 ng/mL

## 2013-06-12 LAB — PRO B NATRIURETIC PEPTIDE: Pro B Natriuretic peptide (BNP): 2889 pg/mL — ABNORMAL HIGH (ref 0–125)

## 2013-06-12 LAB — CARBOXYHEMOGLOBIN
Carboxyhemoglobin: 1.6 % — ABNORMAL HIGH (ref 0.5–1.5)
METHEMOGLOBIN: 1.2 % (ref 0.0–1.5)
O2 Saturation: 91.4 %
TOTAL HEMOGLOBIN: 7.1 g/dL — AB (ref 13.5–18.0)

## 2013-06-12 LAB — TROPONIN I: Troponin I: 19.79 ng/mL (ref <0.30)

## 2013-06-12 LAB — PHOSPHORUS: PHOSPHORUS: 1.6 mg/dL — AB (ref 2.3–4.6)

## 2013-06-12 MED ORDER — POTASSIUM CHLORIDE 10 MEQ/50ML IV SOLN
10.0000 meq | INTRAVENOUS | Status: AC
  Administered 2013-06-12 (×2): 10 meq via INTRAVENOUS
  Filled 2013-06-12 (×2): qty 50

## 2013-06-12 MED ORDER — ONDANSETRON HCL 4 MG/2ML IJ SOLN
4.0000 mg | Freq: Three times a day (TID) | INTRAMUSCULAR | Status: DC | PRN
  Administered 2013-06-12 – 2013-06-13 (×2): 4 mg via INTRAVENOUS
  Filled 2013-06-12 (×2): qty 2

## 2013-06-12 MED ORDER — FUROSEMIDE 10 MG/ML IJ SOLN
40.0000 mg | Freq: Once | INTRAMUSCULAR | Status: AC
  Administered 2013-06-12: 40 mg via INTRAVENOUS
  Filled 2013-06-12: qty 4

## 2013-06-12 MED ORDER — POTASSIUM PHOSPHATES 15 MMOLE/5ML IV SOLN
24.0000 mmol | Freq: Once | INTRAVENOUS | Status: AC
  Administered 2013-06-12: 24 mmol via INTRAVENOUS
  Filled 2013-06-12: qty 8

## 2013-06-12 MED ORDER — FENTANYL CITRATE 0.05 MG/ML IJ SOLN
100.0000 ug | INTRAMUSCULAR | Status: DC | PRN
  Administered 2013-06-12 – 2013-06-13 (×2): 100 ug via INTRAVENOUS
  Filled 2013-06-12 (×2): qty 2

## 2013-06-12 MED ORDER — DEXTROSE 50 % IV SOLN
INTRAVENOUS | Status: AC
  Administered 2013-06-12: 25 mL via INTRAVENOUS
  Filled 2013-06-12: qty 50

## 2013-06-12 MED ORDER — POTASSIUM CHLORIDE 10 MEQ/100ML IV SOLN
10.0000 meq | INTRAVENOUS | Status: AC
  Administered 2013-06-12 (×2): 10 meq via INTRAVENOUS
  Filled 2013-06-12: qty 100

## 2013-06-12 MED ORDER — FENTANYL CITRATE 0.05 MG/ML IJ SOLN
100.0000 ug | INTRAMUSCULAR | Status: DC | PRN
  Administered 2013-06-12: 100 ug via INTRAVENOUS
  Filled 2013-06-12: qty 2

## 2013-06-12 MED ORDER — DEXTROSE-NACL 5-0.45 % IV SOLN
INTRAVENOUS | Status: DC
  Administered 2013-06-12: 10:00:00 via INTRAVENOUS
  Administered 2013-06-13: 50 mL/h via INTRAVENOUS

## 2013-06-12 MED ORDER — DEXTROSE 50 % IV SOLN
25.0000 mL | Freq: Once | INTRAVENOUS | Status: AC | PRN
  Administered 2013-06-12: 25 mL via INTRAVENOUS

## 2013-06-12 MED ORDER — VITAL AF 1.2 CAL PO LIQD
1000.0000 mL | ORAL | Status: DC
  Administered 2013-06-12 – 2013-06-13 (×2): 1000 mL
  Filled 2013-06-12 (×6): qty 1000

## 2013-06-12 MED ORDER — ACETAMINOPHEN 160 MG/5ML PO SOLN
650.0000 mg | Freq: Four times a day (QID) | ORAL | Status: DC | PRN
  Administered 2013-06-12: 650 mg
  Filled 2013-06-12: qty 20.3

## 2013-06-12 MED ORDER — MAGNESIUM SULFATE 40 MG/ML IJ SOLN
2.0000 g | Freq: Once | INTRAMUSCULAR | Status: AC
  Administered 2013-06-12: 2 g via INTRAVENOUS
  Filled 2013-06-12: qty 50

## 2013-06-12 NOTE — Progress Notes (Addendum)
Patient ID: [name], male   DOB: 07/14/1955, 58 y.o.   MRN: [id number]   SUBJECTIVE:  Mr. Biehl is a 58 y.o. male with PMH CAD, s/p 1v CABG (SVG to LAD in 2006), recent hospital admission at [facility] on 5/20 with NSTEMI and occlusion of SVG to LAD with stent placed 5/21, bilateral carotid stenosis, L subclavian stenosis, renal artery stenosis, ongoing tobacco use, who presents after unwitnessed cardiac arrest at home requring ~15 minutes of CPR and Epi x3 for ROSC with subsequent codes x2 in ED again.  Intubated and sedated on arrival. Hypothermia protocol was initially started but subsequently stopped. Hypotensive requiring pressors.  He has been febrile and was started on Unasyn for ?aspiration PNA.   Patient remains on 6 mcg norepinephrine this am.  Sedation being weaned for possible extubation.  He is awake and follows commands.  CVP 10.   Scheduled Meds: . ampicillin-sulbactam (UNASYN) IV  3 g Intravenous Q6H  . antiseptic oral rinse  15 mL Mouth Rinse 6 times per day  . aspirin  81 mg Per Tube Daily  . atorvastatin  80 mg Oral q1800  . chlorhexidine  15 mL Mouth/Throat BID  . furosemide  40 mg Intravenous Once  . insulin aspart  2-6 Units Subcutaneous 6 times per day  . pantoprazole (PROTONIX) IV  40 mg Intravenous QHS  . potassium chloride  10 mEq Intravenous Q1 Hr x 2  . thiamine  100 mg Oral Daily   Or  . thiamine IV  100 mg Intravenous Daily  . ticagrelor  90 mg Oral BID   Continuous Infusions: . fentaNYL infusion INTRAVENOUS 150 mcg/hr (06/12/13 0740)  . heparin 1,300 Units/hr (06/12/13 0732)  . midazolam (VERSED) infusion Stopped (06/12/13 0739)  . norepinephrine (LEVOPHED) Adult infusion 6 mcg/min (06/12/13 0740)   PRN Meds:.sodium chloride, acetaminophen (TYLENOL) oral liquid 160 mg/5 mL, albuterol, fentaNYL, midazolam   PHYSICAL EXAM Filed Vitals:   06/12/13 0545 06/12/13 0600 06/12/13 0700 06/12/13 0739  BP: 99/50 97/46 107/48 83/54  Pulse: 62 65 62 75  Temp:  99.5 F (37.5 C)  99.5 F (37.5 C) 100.1 F (37.8 C)  TempSrc:   Core (Comment) Core (Comment)  Resp: 20 18 20 20   Height:      Weight:      SpO2: 97% 98% 99% 98%   General:  Lying in bed, sedated, paralyzed, on mechanical ventilation Lungs:  Coarse b/l breath sounds Heart:  RRR, CVP 9 Abdomen:  Soft, distended, hypoactive bowel sounds Extremities:  Trace edema, cool lower extremities, faint distal pulses better on left foot than right, +tattoos Neuro: Paralyzed and sedated  LABS: Lab Results  Component Value Date   TROPONINI 19.79* 06/12/2013   Results for orders placed during the hospital encounter of 06/09/13 (from the past 24 hour(s))  GLUCOSE, CAPILLARY     Status: None   Collection Time    06/11/13  7:59 AM      Result Value Ref Range   Glucose-Capillary 97  70 - 99 mg/dL  GLUCOSE, CAPILLARY     Status: Abnormal   Collection Time    06/11/13 12:26 PM      Result Value Ref Range   Glucose-Capillary 100 (*) 70 - 99 mg/dL  GLUCOSE, CAPILLARY     Status: Abnormal   Collection Time    06/11/13  4:46 PM      Result Value Ref Range   Glucose-Capillary 132 (*) 70 - 99 mg/dL  GLUCOSE, CAPILLARY  Status: Abnormal   Collection Time    06/11/13  8:15 PM      Result Value Ref Range   Glucose-Capillary 111 (*) 70 - 99 mg/dL  GLUCOSE, CAPILLARY     Status: Abnormal   Collection Time    06/11/13 11:23 PM      Result Value Ref Range   Glucose-Capillary 126 (*) 70 - 99 mg/dL  GLUCOSE, CAPILLARY     Status: Abnormal   Collection Time    06/12/13  4:03 AM      Result Value Ref Range   Glucose-Capillary 47 (*) 70 - 99 mg/dL  GLUCOSE, CAPILLARY     Status: Abnormal   Collection Time    06/12/13  4:26 AM      Result Value Ref Range   Glucose-Capillary 162 (*) 70 - 99 mg/dL  MAGNESIUM     Status: None   Collection Time    06/12/13  4:27 AM      Result Value Ref Range   Magnesium 1.9  1.5 - 2.5 mg/dL  PROCALCITONIN     Status: None   Collection Time    06/12/13  4:27  AM      Result Value Ref Range   Procalcitonin 0.95    HEPARIN LEVEL (UNFRACTIONATED)     Status: Abnormal   Collection Time    06/12/13  4:27 AM      Result Value Ref Range   Heparin Unfractionated 0.18 (*) 0.30 - 0.70 IU/mL  COMPREHENSIVE METABOLIC PANEL     Status: Abnormal   Collection Time    06/12/13  4:27 AM      Result Value Ref Range   Sodium 139  137 - 147 mEq/L   Potassium 3.5 (*) 3.7 - 5.3 mEq/L   Chloride 102  96 - 112 mEq/L   CO2 27  19 - 32 mEq/L   Glucose, Bld 100 (*) 70 - 99 mg/dL   BUN 11  6 - 23 mg/dL   Creatinine, Ser 1.21  0.50 - 1.35 mg/dL   Calcium 7.6 (*) 8.4 - 10.5 mg/dL   Total Protein 4.9 (*) 6.0 - 8.3 g/dL   Albumin 2.3 (*) 3.5 - 5.2 g/dL   AST 638 (*) 0 - 37 U/L   ALT 1075 (*) 0 - 53 U/L   Alkaline Phosphatase 119 (*) 39 - 117 U/L   Total Bilirubin 0.9  0.3 - 1.2 mg/dL   GFR calc non Af Amer 63 (*) >90 mL/min   GFR calc Af Amer 73 (*) >90 mL/min  PHOSPHORUS     Status: Abnormal   Collection Time    06/12/13  4:27 AM      Result Value Ref Range   Phosphorus 1.6 (*) 2.3 - 4.6 mg/dL  CBC WITH DIFFERENTIAL     Status: Abnormal   Collection Time    06/12/13  4:27 AM      Result Value Ref Range   WBC 13.3 (*) 4.0 - 10.5 K/uL   RBC 3.12 (*) 4.22 - 5.81 MIL/uL   Hemoglobin 10.0 (*) 13.0 - 17.0 g/dL   HCT 29.2 (*) 39.0 - 52.0 %   MCV 93.6  78.0 - 100.0 fL   MCH 32.1  26.0 - 34.0 pg   MCHC 34.2  30.0 - 36.0 g/dL   RDW 14.8  11.5 - 15.5 %   Platelets 248  150 - 400 K/uL   Neutrophils Relative % 67  43 - 77 %   Neutro  Abs 9.0 (*) 1.7 - 7.7 K/uL   Lymphocytes Relative 23  12 - 46 %   Lymphs Abs 3.1  0.7 - 4.0 K/uL   Monocytes Relative 9  3 - 12 %   Monocytes Absolute 1.1 (*) 0.1 - 1.0 K/uL   Eosinophils Relative 1  0 - 5 %   Eosinophils Absolute 0.1  0.0 - 0.7 K/uL   Basophils Relative 0  0 - 1 %   Basophils Absolute 0.0  0.0 - 0.1 K/uL  PRO B NATRIURETIC PEPTIDE     Status: Abnormal   Collection Time    06/12/13  4:27 AM      Result Value  Ref Range   Pro B Natriuretic peptide (BNP) 2889.0 (*) 0 - 125 pg/mL  TROPONIN I     Status: Abnormal   Collection Time    06/12/13  4:27 AM      Result Value Ref Range   Troponin I 19.79 (*) <0.30 ng/mL  CARBOXYHEMOGLOBIN     Status: Abnormal   Collection Time    06/12/13  4:30 AM      Result Value Ref Range   Total hemoglobin 7.1 (*) 13.5 - 18.0 g/dL   O2 Saturation 91.4     Carboxyhemoglobin 1.6 (*) 0.5 - 1.5 %   Methemoglobin 1.2  0.0 - 1.5 %  GLUCOSE, CAPILLARY     Status: Abnormal   Collection Time    06/12/13  6:20 AM      Result Value Ref Range   Glucose-Capillary 114 (*) 70 - 99 mg/dL    Intake/Output Summary (Last 24 hours) at 06/12/13 0754 Last data filed at 06/12/13 0740  Gross per 24 hour  Intake 1934.95 ml  Output   3122 ml  Net -1187.05 ml   ASSESSMENT AND PLAN: 58 yo admitted with PEA arrest and found to have suspected anterior MI, possibly from stent thrombosis.  1. Cardiac arrest: Admitted with PEA arrest, no definite VT/VF noted.   - LFTs very high.  Will stop amiodarone.  2. Anterior MI:  Based on ECG, concern for thrombosis of SVG-LAD graft stent that was placed at [facility] on 06/02/13.  He was not cathed when initially admitted here.  P2Y12 test with high PRU level.  Family says that he has been taking his Plavix at home, suspect Plavix resistance.  He was changed to ticagrelor.  - Continue ASA 81, statin, ticagrelor.  He is on heparin gtt since 5/28.  No LV thrombus noted on echo.  Think we can stop this.  - He will need eventual left heart cath.  Timing to depend on clinical situation.  As early as tomorrow if his fevers resolve and he is extubated and off norepinephrine.  3. Acute systolic CHF: EF 30-07% on echo this admission.  Last echo at [facility] in 1/15 with normal EF.  As above, concern for thrombosis of recent SVG-LAD stent. CVP 10 today.  He remains on norepinephrine at 6.  Co-ox has been good.   - Hopefully titrate off norepinephrine completely with  sedation wean.   - Co-ox in am off norepinephrine.   - Will give Lasix 40 mg IV x 1 in preparation for possible extubation.  He diuresed well with stable creatinine.   4. ID: Febrile overnight.  Started on Unasyn for possible aspiration pneumonia/pneumonitis per CCM.  5. Elevated LFTs: Transaminases quite high.  He is on amiodarone.  This may be shock liver related to his cardiogenic shock, but will stop  amiodarone at this time given no definite ventricular arrhythmias seen.   Larey Dresser 06/12/2013 7:54 AM

## 2013-06-12 NOTE — Progress Notes (Signed)
PULMONARY / CRITICAL CARE MEDICINE   Name: [name] MRN: [id number] DOB: 1955/06/14    ADMISSION DATE:  06/09/2013 CONSULTATION DATE:  06/09/2013  REFERRING MD :  EDP PRIMARY SERVICE: PCCM  CHIEF COMPLAINT:  Cardiac arrest  BRIEF PATIENT DESCRIPTION:  58 yo with significant cardiac history, s/p CABG and recent PTCA / stent ( rethrombosed ). Brought to [facility] ED 5/28 after collapse and PEA arrest at home. Unknown downtime, CPR by family x 5 minutes and ACLS x 15 minutes with ROSC.  Repeated cardiac arrest in ED x 2.   LINES / TUBES: OETT 5/29 >>> OGT 5/29 >>> L [place] CVL 5/29 >>> Foley 5/29 >>>  CULTURES: Blood 5/29 >>> Urine 5/29 >>>  ANTIBIOTICS: Unasyn 5/29 >>>     SIGNIFICANT EVENTS / STUDIES:  5/19  MI with PTCA at [facility], stent thrombosis 5/28  PEA arrest, decision against cardiac cath and hypothermia 5/28  Refractory hypoxemia, Nimbex started 06/11/13:  Nimbex continues, on levophed, on deep sedation. Fio2 down to 40%, Daughter at bedside   SUBJECTIVE/OVERNIGHT/INTERVAL HX 06/12/13: Off nimbex.  Still on levophed at 37mcg. Having fever 38.6C.  Folllows commands, mouths words appropriately : on fent gtt. Having some hypoglycemia  VITAL SIGNS: Temp:  [99.5 F (37.5 C)-101.5 F (38.6 C)] 100 F (37.8 C) (05/31 0900) Pulse Rate:  [62-88] 79 (05/31 0900) Resp:  [15-21] 18 (05/31 0900) BP: (81-120)/(44-75) 105/61 mmHg (05/31 0900) SpO2:  [95 %-99 %] 97 % (05/31 0900) FiO2 (%):  [40 %] 40 % (05/31 0816) Weight:  [88 kg (194 lb 0.1 oz)] 88 kg (194 lb 0.1 oz) (05/31 0412)  HEMODYNAMICS: CVP:  [6 mmHg-10 mmHg] 8 mmHg  VENTILATOR SETTINGS: Vent Mode:  [-] PRVC FiO2 (%):  [40 %] 40 % Set Rate:  [20 bmp] 20 bmp Vt Set:  [570 mL] 570 mL PEEP:  [5 cmH20-8 cmH20] 5 cmH20 Plateau Pressure:  [23 cmH20-26 cmH20] 23 cmH20 INTAKE / OUTPUT: Intake/Output     05/30 0701 - 05/31 0700 05/31 0701 - 06/01 0700   I.V. (mL/kg) 1352.3 (15.4) 22.7 (0.3)   Other     NG/GT 60     IV Piggyback 500    Total Intake(mL/kg) 1912.3 (21.7) 22.7 (0.3)   Urine (mL/kg/hr) 2922 (1.4) 150 (0.7)   Emesis/NG output 200 (0.1)    Total Output 3122 150   Net -1209.7 -127.3         PHYSICAL EXAMINATION: General:  Appears acutely ill, mechanically ventilated, synchronous Neuro:  On fent gtt: RASS -2, able to follow commands, moves all 4s.  HEENT:  PERRL, OETT / OGT Cardiovascular:  RRR, no m/r/g Lungs:  Bilateral diminished air entry, rales Abdomen:  Soft, nontender, bowel sounds diminished Musculoskeletal:  Moves all extremities, no edema Skin:  Intact  LABS:  PULMONARY  Recent Labs Lab 06/09/13 2235 06/09/13 2245 06/09/13 2248 06/10/13 0012 06/10/13 0326 06/12/13 0430  PHART  --   --  7.076* 7.063* 7.302*  --   PCO2ART  --   --  56.8* 62.1* 33.7*  --   PO2ART  --   --  58.0* 56.0* 118.0*  --   HCO3 20.2  --  16.7* 17.7* 16.8*  --   TCO2 22 19 18 20 18   --   O2SAT 39.0  --  77.0 75.0 98.0 91.4    CBC  Recent Labs Lab 06/10/13 0010 06/11/13 0400 06/12/13 0427  HGB 11.5* 11.2* 10.0*  HCT 35.3* 33.0* 29.2*  WBC 24.0* 17.3* 13.3*  PLT 314 286 248    COAGULATION  Recent Labs Lab 06/09/13 2237  INR 1.22    CARDIAC    Recent Labs Lab 06/10/13 0010 06/10/13 1119 06/10/13 1800 06/11/13 0005 06/12/13 0427  TROPONINI 1.54* >20.00* >20.00* >20.00* 19.79*    Recent Labs Lab 06/12/13 0427  PROBNP 2889.0*     CHEMISTRY  Recent Labs Lab 06/10/13 0010 06/10/13 0418  06/10/13 1115 06/10/13 1800 06/11/13 0005 06/11/13 0400 06/12/13 0427  NA 122*  --   < > 138 142 137 139 139  K 4.8  --   < > 4.7 4.0 3.9 3.7 3.5*  CL 89*  --   < > 103 105 104 105 102  CO2 16*  --   < > 22 22 23 24 27   GLUCOSE 729*  --   < > 185* 127* 133* 131* 100*  BUN 15  --   < > 21 17 15 14 11   CREATININE 1.05  --   < > 1.27 1.15 1.17 1.14 1.21  CALCIUM 6.5*  --   < > 7.4* 6.8* 7.3* 7.4* 7.6*  MG  --  2.0  --   --   --   --  1.6 1.9  PHOS  --  2.2*  --   --    --   --   --  1.6*  < > = values in this interval not displayed. Estimated Creatinine Clearance: 71.3 ml/min (by C-G formula based on Cr of 1.21).   LIVER  Recent Labs Lab 06/09/13 2237 06/12/13 0427  AST  --  638*  ALT  --  1075*  ALKPHOS  --  119*  BILITOT  --  0.9  PROT  --  4.9*  ALBUMIN  --  2.3*  INR 1.22  --      INFECTIOUS  Recent Labs Lab 06/10/13 1114 06/10/13 1800 06/11/13 0005 06/11/13 0400 06/12/13 0427  LATICACIDVEN 2.9* 2.3* 1.7  --   --   PROCALCITON  --   --   --  1.65 0.95     ENDOCRINE CBG (last 3)   Recent Labs  06/12/13 0426 06/12/13 0620 06/12/13 0744  GLUCAP 162* 114* 100*         IMAGING x48h  Dg Chest Port 1 View  06/12/2013   CLINICAL DATA:  Check endotracheal tube position  EXAM: PORTABLE CHEST - 1 VIEW  COMPARISON:  06/11/2013, 06/09/2013  FINDINGS: Endotracheal tube with the tip 5.8 cm above the carina. Left jugular central venous catheter with the tip projecting over the SVC. Nasogastric tube projecting over the stomach.  Bilateral interstitial thickening. Small left pleural effusion. No focal consolidation. No pneumothorax. Stable cardiomegaly. CABG. Unremarkable osseous structures.  IMPRESSION: 1. Endotracheal tube with the tip 5.8 cm above the carina. 2. Mild pulmonary edema of. No significant interval change from the prior exam when accounting for differences in technique.   Electronically Signed   By: Kathreen Devoid   On: 06/12/2013 07:13   Dg Chest Port 1 View  06/11/2013   CLINICAL DATA:  Short of breath. Chest congestion. Intubated patient. Evaluate support apparatus.  EXAM: PORTABLE CHEST - 1 VIEW  COMPARISON:  06/10/2013.  FINDINGS: The cardiopericardial silhouette grossly appears within normal limits, unchanged. Defibrillator pads overlie the chest. CABG/ median sternotomy.  Left subclavian central line is unchanged. The endotracheal tube is difficult to visualize but appears unchanged, midway between the clavicles on the  carina. Airspace disease has improved, compatible with improving pulmonary edema.  Subsegmental atelectasis radiates from the right hilum. Enteric tube remains present in the stomach.  IMPRESSION: 1. Unchanged support apparatus. 2. Improved pulmonary edema.   Electronically Signed   By: Dereck Ligas M.D.   On: 06/11/2013 08:03      ASSESSMENT / PLAN:  PULMONARY A: Acute respiratory failure in setting of cardiac arrest, cardiogenic shock Pulmonary edema Possible aspiration pneumonia   - down to 40% fio2 and peep 5. Setting of apnea alarms on SBT but was on sedation gtt  P:   SBT to be done off sedation gtt and assess for extubation Continuous mechanical support if fails SBT VAP bundle Trend ABG/CXR Albuterol PRN  CARDIOVASCULAR A:  S/p PEA arrest with Cardiogenic shock at admit 06/09/2013 Acute on chronic systolic CHF at admit 9/62/2297 - ef 15%   -  Based on admit  ECG, concern for thrombosis of SVG-LAD graft stent that was placed at [facility] on 06/02/13. He was not cathed when initially admitted here. P2Y12 test with high PRU level. Family says that he has been taking his Plavix at home, suspect Plavix resistance.    - on pressors, statin heparin gtt, aspirin,  brilinta (p2y212 level 246). Cards stopped amio due to rising LFT and has given lasix to prepare for possible extubation  P:  Goal MAP > 65 Cardiology following - Per cards   - lasix per cards  - -will need to go to cath lab once more stable pending neurological improvement   -wean off Levophed  - continue statin, heparin gtt, aspirin, brilinta  =- monitor LFT off amio   BB/ACEI contraindicated - shock  RENAL A:   AKI; resolved  - low K, low mag and low phos  P:   Replete k, phos, and mag   GASTROINTESTINAL A:   GI Px Nutrition  Rising LFT - concern for shock liver. Cards stopped amio 06/12/13  P:   Protonix Monitor LFT and IR  HEMATOLOGIC A:   Heparinization for ACS Anemia P:  Heparin gtt per  Pharmacy PRBC for hgb < 8gm% given MI  INFECTIOUS A:   Possible aspiration pneumonia vs stress response   - FEver as high as 38.6C   P:   Start scheduled tylenol to control fever; goal is < 37.5C in setting of cardiac arrest Continue  Unasyn PCT Monitor LFT with tylenol Consider ibuprofen if needed   ENDOCRINE A:   Hyperglycemia  P:   SSI  NEUROLOGIC A:   Acute encephalopathy at admit. Deemed not to be a candidate for hypothermia as  neurologically intact post arrest    - On 06/13/13: Following commands and recognizing family and moving all 4s while on fent gtt  P:   Maintain normothermia Change RASS goal: -1 DC  Fentanyl gtt, change to fentanyl prn   GLOBAL 06/11/13: expressed to daughter that encephalopathy can be a concern. She stated full code but does not want prolonged mechanical ventilation 5/31/5: Prognosis more optimistic from neuro standpoint. Daughter updated. Aim normothermia. DC scheduled tylenol asap  I have personally obtained history, examined patient, evaluated and interpreted laboratory and imaging results, reviewed medical records, formulated assessment / plan and placed orders.  CRITICAL CARE:  The patient is critically ill with multiple organ systems failure and requires high complexity decision making for assessment and support, frequent evaluation and titration of therapies, application of advanced monitoring technologies and extensive interpretation of multiple databases. Critical Care Time devoted to patient care services described in this note is 50 minutes.  Dr. Brand Males, M.D., [facility].C.P Pulmonary and Critical Care Medicine Staff Physician [facility] Pager: [phone], If no answer or between  15:00h - 7:00h: call [phone]  06/12/2013 9:26 AM

## 2013-06-12 NOTE — Progress Notes (Signed)
ANTICOAGULATION CONSULT NOTE - Follow Up Consult  Pharmacy Consult for Heparin  Indication: chest pain/ACS, cardiac arrest  No Known Allergies  Patient Measurements: Height: 5\' 9"  (175.3 cm) Weight: 194 lb 0.1 oz (88 kg) IBW/kg (Calculated) : 70.7 Heparin Dosing Weight: 89 kg  Vital Signs: Temp: 100.4 F (38 C) (05/31 1300) Temp src: Core (Comment) (05/31 0739) BP: 97/60 mmHg (05/31 1300) Pulse Rate: 84 (05/31 1300)  Labs:  Recent Labs  06/09/13 2237  06/10/13 0010  06/10/13 1800  06/11/13 0005 06/11/13 0400 06/12/13 0427 06/12/13 1243  HGB 12.0*  < > 11.5*  --   --   --   --  11.2* 10.0*  --   HCT 36.3*  < > 35.3*  --   --   --   --  33.0* 29.2*  --   PLT 296  --  314  --   --   --   --  286 248  --   APTT 39*  --   --   --   --   --   --   --   --   --   LABPROT 15.1  --   --   --   --   --   --   --   --   --   INR 1.22  --   --   --   --   --   --   --   --   --   HEPARINUNFRC  --   --   --   < >  --   < > 0.44 0.34 0.18* 0.30  CREATININE 1.41*  < > 1.05  < > 1.15  --  1.17 1.14 1.21  --   TROPONINI  --   --  1.54*  < > >20.00*  --  >20.00*  --  19.79*  --   < > = values in this interval not displayed.  Estimated Creatinine Clearance: 71.3 ml/min (by C-G formula based on Cr of 1.21).  Medical History: Past Medical History  Diagnosis Date  . Acute MI    Assessment: 58 y/o M unwitnessed cardiac arrest with ROSC after ACLS on heparin per pharmacy. HL is therapeutic this afternoon at 0.30 after a rate increase, though it is on the low end of the therapeutic goal range.   H/H trending down slightly, plt wnl and no reported s/s bleeding.   Goal of Therapy:  Heparin level 0.3-0.7 units/ml Monitor platelets by anticoagulation protocol: Yes   Plan:  -Increase heparin drip slightly to 1350 units/hr to maintain in goal range -Daily CBC/HL -Monitor for bleeding  Keary Waterson C. Kelso Bibby, PharmD Clinical Pharmacist-Resident Pager: [phone] Pharmacy:  [phone] 06/12/2013 2:20 PM

## 2013-06-12 NOTE — Progress Notes (Addendum)
Wasted the following with Lesleigh Noe, RN as witness:  Versed gtt:  53mL Fentanyl gtt: 158mL        Co sign Minna Merritts

## 2013-06-12 NOTE — Progress Notes (Signed)
Contacted [facility] regarding pt's c/o abd discomfort.  Minimal residuals noted and pt's daughters report that pt has a history of hernia discomfort in that area.  When asked if pt feels nauseous, he shook his head "yes".  Requested and received telephone order for zofran (see MAR) from Bethann Humble, MD.  Pt currently resting with eyes closed.  Will continue to closely monitor.

## 2013-06-12 NOTE — Progress Notes (Signed)
[facility] for Heparin  Indication: chest pain/ACS, cardiac arrest  No Known Allergies  Patient Measurements: Height: 5\' 9"  (175.3 cm) Weight: 194 lb 0.1 oz (88 kg) IBW/kg (Calculated) : 70.7 Heparin Dosing Weight: 89 kg  Vital Signs: Temp: 100.4 F (38 C) (05/31 0215) BP: 90/62 mmHg (05/31 0349) Pulse Rate: 80 (05/31 0330)  Labs:  Recent Labs  06/09/13 2237  06/10/13 0010  06/10/13 1800  06/11/13 0005 06/11/13 0400 06/12/13 0427  HGB 12.0*  < > 11.5*  --   --   --   --  11.2* 10.0*  HCT 36.3*  < > 35.3*  --   --   --   --  33.0* 29.2*  PLT 296  --  314  --   --   --   --  286 248  APTT 39*  --   --   --   --   --   --   --   --   LABPROT 15.1  --   --   --   --   --   --   --   --   INR 1.22  --   --   --   --   --   --   --   --   HEPARINUNFRC  --   --   --   < >  --   < > 0.44 0.34 0.18*  CREATININE 1.41*  < > 1.05  < > 1.15  --  1.17 1.14 1.21  TROPONINI  --   --  1.54*  < > >20.00*  --  >20.00*  --  19.79*  < > = values in this interval not displayed.  Estimated Creatinine Clearance: 71.3 ml/min (by C-G formula based on Cr of 1.21).  Assessment: 58 y/o Male with NSTEMI s/p cardiac arrest for heparin   Goal of Therapy:  Heparin level 0.3-0.7 units/ml Monitor platelets by anticoagulation protocol: Yes   Plan:  Increase Heparin 1300 units/hr Check heparin level in 6 hours.  Phillis Knack, PharmD, BCPS  06/12/2013 5:16 AM

## 2013-06-12 NOTE — Progress Notes (Signed)
Pt's temp remains 38 degrees with PRN tylenol, decrease temp in the room and ice packs in axilla and groin.  Chase Caller, MD notified and requested cooling blanket - order confirmed and placed via telephone order.  Will continue to closely monitor.

## 2013-06-12 NOTE — Progress Notes (Signed)
Brief Nutrition Note  Consult received for enteral/tube feeding initiation and management.  Adult Enteral Nutrition Protocol initiated.  TF ordered.  Full assessment t5/29.  RD to follow. Admitting Dx: Cardiac arrest [427.5]  Body mass index is 28.64 kg/(m^2). Pt meets criteria for overweight based on current BMI.  Labs:   Recent Labs Lab 06/10/13 0010 06/10/13 0418  06/11/13 0005 06/11/13 0400 06/12/13 0427  NA 122*  --   < > 137 139 139  K 4.8  --   < > 3.9 3.7 3.5*  CL 89*  --   < > 104 105 102  CO2 16*  --   < > 23 24 27   BUN 15  --   < > 15 14 11   CREATININE 1.05  --   < > 1.17 1.14 1.21  CALCIUM 6.5*  --   < > 7.3* 7.4* 7.6*  MG  --  2.0  --   --  1.6 1.9  PHOS  --  2.2*  --   --   --  1.6*  GLUCOSE 729*  --   < > 133* 131* 100*  < > = values in this interval not displayed.  Antonieta Iba, RD, LDN Clinical Inpatient Dietitian Pager:  [phone] Weekend and after hours pager:  [phone]

## 2013-06-12 NOTE — Progress Notes (Signed)
Ramaswamy, MD notified of pt's hypoglycemic event overnight, pt continues being intermittently febrile and verified electrolyte replacement orders.  MD states he wants temp to stay less than 37.5 and has ordered tylenol q6 hr scheduled - temp parameters updated via monitor.  Pt's family updated and questions and concerns addressed by MD.   Pt remains easily arousable and is currently resting with his eyes closed.  Will continue to closely monitor.

## 2013-06-12 NOTE — Progress Notes (Signed)
Hypoglycemic Event  CBG: 47  Treatment: D50 IV 25 mL  Symptoms: Light Headed  Follow-up CBG: Time:0425 CBG Result:162  Possible Reasons for Event: Inadequate meal intake  Comments/MD notified: ICU Hypoglycemic Protocol intiated.    [name] [name]  Remember to initiate Hypoglycemia Order Set & complete

## 2013-06-12 NOTE — Progress Notes (Addendum)
EKG changes noted - STAT 12 lead EKG ordered per unit protocol  Aundra Dubin, MD paged and notified of EKG changes.  Orders updated per Aundra Dubin, MD: Wean levo to maintain MAP greater than 60 - SBP greater than 90  Pt opens eyes to voice, nods head appropriately, f/c x4.  Family remains bedside.  Will continue to closely monitor.

## 2013-06-12 NOTE — Progress Notes (Signed)
[facility] ADULT ICU REPLACEMENT PROTOCOL FOR AM LAB REPLACEMENT ONLY  The patient does apply for the [facility] Adult ICU Electrolyte Replacment Protocol based on the criteria listed below:   1. Is GFR >/= 40 ml/min? yes  Patient's GFR today is 63 2. Is urine output >/= 0.5 ml/kg/hr for the last 6 hours? yes Patient's UOP is 0.5 ml/kg/hr 3. Is BUN < 60 mg/dL? yes  Patient's BUN today is 11 4. Abnormal electrolyte(s): K+3.5 5. Ordered repletion with: protocol 6. If a panic level lab has been reported, has the CCM MD in charge been notified? yes.   Physician:  E Deterding  [name] 06/12/2013 5:24 AM

## 2013-06-12 NOTE — Progress Notes (Signed)
Pt c/o lower abd pain - tube feed residual checked - 38ml of tan/green aspirated. Pt denies needing to have a bowel mvmt.  Family states that pt has hernia pain at baseline - asked pt if this is similar to his baseline abd pain - pt shook head "yes".   PRN fentanyl given per order.  Pt's family remains bedside.  Pt currently resting with eyes closed - will continue to closely monitor.

## 2013-06-12 NOTE — Progress Notes (Signed)
MD updated of pt's hypoglycemia overnight, cbg 100 this AM, current NPO status and is again febrile.  Advised that tylenol will be given and q4 cbg continued.  Pt continues following commands, easily arousable but currently resting with her eyes closed.  Family remains bedside.  Will continue to closely monitor.

## 2013-06-12 NOTE — Progress Notes (Signed)
EKG CRITICAL VALUE     12 lead EKG performed.  Critical value noted.Lucky Cowboy, RN notified.   Cori Razor, CCT 06/12/2013 12:58 PM

## 2013-06-13 ENCOUNTER — Inpatient Hospital Stay (HOSPITAL_COMMUNITY): Payer: Medicaid Other

## 2013-06-13 LAB — LACTIC ACID, PLASMA: Lactic Acid, Venous: 1.2 mmol/L (ref 0.5–2.2)

## 2013-06-13 LAB — HEPATITIS PANEL, ACUTE
HCV AB: NEGATIVE
HEP A IGM: NONREACTIVE
Hep B C IgM: NONREACTIVE
Hepatitis B Surface Ag: NEGATIVE

## 2013-06-13 LAB — GLUCOSE, CAPILLARY
GLUCOSE-CAPILLARY: 90 mg/dL (ref 70–99)
GLUCOSE-CAPILLARY: 109 mg/dL — AB (ref 70–99)
GLUCOSE-CAPILLARY: 104 mg/dL — AB (ref 70–99)
GLUCOSE-CAPILLARY: 102 mg/dL — AB (ref 70–99)
Glucose-Capillary: 109 mg/dL — ABNORMAL HIGH (ref 70–99)
Glucose-Capillary: 110 mg/dL — ABNORMAL HIGH (ref 70–99)
Glucose-Capillary: 116 mg/dL — ABNORMAL HIGH (ref 70–99)
Glucose-Capillary: 91 mg/dL (ref 70–99)

## 2013-06-13 LAB — BASIC METABOLIC PANEL
BUN: 10 mg/dL (ref 6–23)
CALCIUM: 7.7 mg/dL — AB (ref 8.4–10.5)
CO2: 27 mEq/L (ref 19–32)
Chloride: 100 mEq/L (ref 96–112)
Creatinine, Ser: 1.07 mg/dL (ref 0.50–1.35)
GFR calc Af Amer: 85 mL/min — ABNORMAL LOW (ref >90)
GFR calc non Af Amer: 74 mL/min — ABNORMAL LOW (ref >90)
GLUCOSE: 122 mg/dL — AB (ref 70–99)
POTASSIUM: 3.3 meq/L — AB (ref 3.7–5.3)
Sodium: 137 mEq/L (ref 137–147)

## 2013-06-13 LAB — CARBOXYHEMOGLOBIN
CARBOXYHEMOGLOBIN: 1.5 % (ref 0.5–1.5)
Methemoglobin: 1.1 % (ref 0.0–1.5)
O2 SAT: 73.3 %
Total hemoglobin: 8.6 g/dL — ABNORMAL LOW (ref 13.5–18.0)

## 2013-06-13 LAB — CBC WITH DIFFERENTIAL/PLATELET
Basophils Absolute: 0 10*3/uL (ref 0.0–0.1)
Basophils Relative: 0 % (ref 0–1)
EOS ABS: 0.3 10*3/uL (ref 0.0–0.7)
EOS PCT: 2 % (ref 0–5)
HCT: 30.3 % — ABNORMAL LOW (ref 39.0–52.0)
HEMOGLOBIN: 10 g/dL — AB (ref 13.0–17.0)
LYMPHS ABS: 2 10*3/uL (ref 0.7–4.0)
LYMPHS PCT: 18 % (ref 12–46)
MCH: 31.4 pg (ref 26.0–34.0)
MCHC: 33 g/dL (ref 30.0–36.0)
MCV: 95.3 fL (ref 78.0–100.0)
MONOS PCT: 13 % — AB (ref 3–12)
Monocytes Absolute: 1.4 10*3/uL — ABNORMAL HIGH (ref 0.1–1.0)
Neutro Abs: 7.6 10*3/uL (ref 1.7–7.7)
Neutrophils Relative %: 67 % (ref 43–77)
PLATELETS: 251 10*3/uL (ref 150–400)
RBC: 3.18 MIL/uL — ABNORMAL LOW (ref 4.22–5.81)
RDW: 14.8 % (ref 11.5–15.5)
WBC: 11.3 10*3/uL — ABNORMAL HIGH (ref 4.0–10.5)

## 2013-06-13 LAB — HEPATIC FUNCTION PANEL
ALK PHOS: 151 U/L — AB (ref 39–117)
ALT: 811 U/L — AB (ref 0–53)
AST: 289 U/L — ABNORMAL HIGH (ref 0–37)
Albumin: 2.4 g/dL — ABNORMAL LOW (ref 3.5–5.2)
Bilirubin, Direct: 0.4 mg/dL — ABNORMAL HIGH (ref 0.0–0.3)
Indirect Bilirubin: 0.5 mg/dL (ref 0.3–0.9)
Total Bilirubin: 0.9 mg/dL (ref 0.3–1.2)
Total Protein: 5.3 g/dL — ABNORMAL LOW (ref 6.0–8.3)

## 2013-06-13 LAB — HEPARIN LEVEL (UNFRACTIONATED): Heparin Unfractionated: 0.26 IU/mL — ABNORMAL LOW (ref 0.30–0.70)

## 2013-06-13 LAB — CORTISOL: Cortisol, Plasma: 6.4 ug/dL

## 2013-06-13 LAB — PROTIME-INR
INR: 1.16 (ref 0.00–1.49)
PROTHROMBIN TIME: 14.6 s (ref 11.6–15.2)

## 2013-06-13 LAB — PHOSPHORUS: Phosphorus: 1.8 mg/dL — ABNORMAL LOW (ref 2.3–4.6)

## 2013-06-13 LAB — TROPONIN I: Troponin I: 10.6 ng/mL (ref <0.30)

## 2013-06-13 LAB — MAGNESIUM: Magnesium: 2.2 mg/dL (ref 1.5–2.5)

## 2013-06-13 MED ORDER — SODIUM CHLORIDE 0.9 % IV BOLUS (SEPSIS)
500.0000 mL | Freq: Once | INTRAVENOUS | Status: AC
  Administered 2013-06-13: 500 mL via INTRAVENOUS

## 2013-06-13 MED ORDER — SODIUM PHOSPHATE 3 MMOLE/ML IV SOLN
20.0000 mmol | Freq: Once | INTRAVENOUS | Status: AC
  Administered 2013-06-13: 20 mmol via INTRAVENOUS
  Filled 2013-06-13: qty 6.67

## 2013-06-13 MED ORDER — POTASSIUM CHLORIDE 10 MEQ/50ML IV SOLN
INTRAVENOUS | Status: AC
  Administered 2013-06-13: 10 meq
  Filled 2013-06-13: qty 50

## 2013-06-13 MED ORDER — POTASSIUM CHLORIDE 10 MEQ/50ML IV SOLN
10.0000 meq | INTRAVENOUS | Status: AC
  Administered 2013-06-13: 10 meq via INTRAVENOUS
  Filled 2013-06-13: qty 50

## 2013-06-13 NOTE — Progress Notes (Signed)
[facility] ADULT ICU REPLACEMENT PROTOCOL FOR AM LAB REPLACEMENT ONLY  The patient does apply for the [facility] Adult ICU Electrolyte Replacment Protocol based on the criteria listed below:   1. Is GFR >/= 40 ml/min? yes  Patient's GFR today is 74 2. Is urine output >/= 0.5 ml/kg/hr for the last 6 hours? yes Patient's UOP is 0.7 ml/kg/hr 3. Is BUN < 60 mg/dL? yes  Patient's BUN today is 10 4. Abnormal electrolyte(s): K+3.3  Phos 1.8 5. Ordered repletion with: protocol 6. If a panic level lab has been reported, has the CCM MD in charge been notified? yes.   Physician:  Luther Redo [facility] 06/13/2013 6:42 AM

## 2013-06-13 NOTE — Progress Notes (Signed)
PULMONARY / CRITICAL CARE MEDICINE   Name: [name] MRN: [id number] DOB: [birth year]    ADMISSION DATE:  06/09/2013 CONSULTATION DATE:  06/09/2013  REFERRING MD :  EDP PRIMARY SERVICE: PCCM  CHIEF COMPLAINT:  Cardiac arrest  BRIEF PATIENT DESCRIPTION:  58 yo with significant cardiac history, s/p CABG and recent PTCA / stent ( rethrombosed ). Brought to [facility] ED 5/28 after collapse and PEA arrest at home. Unknown downtime, CPR by family x 5 minutes and ACLS x 15 minutes with ROSC.  Repeated cardiac arrest in ED x 2.   LINES / TUBES: OETT 5/29 >>> OGT 5/29 >>> L [place] CVL 5/29 >>> Foley 5/29 >>>  CULTURES: Blood 5/29 >>> Urine 5/29 >>>  ANTIBIOTICS: Unasyn 5/29 >>>  SIGNIFICANT EVENTS / STUDIES:  5/19  MI with PTCA at [facility], stent thrombosis 5/28  PEA arrest, decision against cardiac cath and hypothermia 5/28  Refractory hypoxemia, Nimbex started 06/11/13:  Nimbex continues, on levophed, on deep sedation. Fio2 down to 40%, Daughter at bedside 06/12/13: Off nimbex.  Still on levophed at 8mcg. Having fever 38.6C.  Folllows commands, mouths words appropriately : on fent gtt. Having some hypoglycemia  SUBJECTIVE/OVERNIGHT/INTERVAL HX:  Hypo electrolytes, low MV wean, remains on pressors   VITAL SIGNS: Temp:  [98.6 F (37 C)-100.6 F (38.1 C)] 99 F (37.2 C) (06/01 0829) Pulse Rate:  [59-85] 70 (06/01 0753) Resp:  [18-22] 20 (06/01 0753) BP: (78-131)/(43-74) 109/59 mmHg (06/01 0753) SpO2:  [95 %-100 %] 96 % (06/01 0753) FiO2 (%):  [40 %] 40 % (06/01 0753) Weight:  [87.9 kg (193 lb 12.6 oz)] 87.9 kg (193 lb 12.6 oz) (06/01 0500)  HEMODYNAMICS: CVP:  [5 mmHg-14 mmHg] 5 mmHg  VENTILATOR SETTINGS: Vent Mode:  [-] PRVC FiO2 (%):  [40 %] 40 % Set Rate:  [20 bmp] 20 bmp Vt Set:  [570 mL] 570 mL PEEP:  [5 cmH20] 5 cmH20 Plateau Pressure:  [19 cmH20-23 cmH20] 23 cmH20 INTAKE / OUTPUT: Intake/Output     05/31 0701 - 06/01 0700 06/01 0701 - 06/02 0700   I.V. (mL/kg) 1759.8  (20)    NG/GT 757.3    IV Piggyback 958    Total Intake(mL/kg) 3475.1 (39.5)    Urine (mL/kg/hr) 3455 (1.6)    Emesis/NG output     Total Output 3455     Net +20.1           PHYSICAL EXAMINATION: General:  Appears acutely ill Neuro:  rass 0, follows commands HEENT:  PERRL, OETT / OGT Cardiovascular:  RRR, no m/r/g Lungs:  Reduced , cta Abdomen:  Soft, nontender, bowel sounds diminished Musculoskeletal:  Moves all extremities, no edema Skin:  Intact  LABS:  PULMONARY  Recent Labs Lab 06/09/13 2235 06/09/13 2245 06/09/13 2248 06/10/13 0012 06/10/13 0326 06/12/13 0430 06/13/13 0500  PHART  --   --  7.076* 7.063* 7.302*  --   --   PCO2ART  --   --  56.8* 62.1* 33.7*  --   --   PO2ART  --   --  58.0* 56.0* 118.0*  --   --   HCO3 20.2  --  16.7* 17.7* 16.8*  --   --   TCO2 22 19 18 20 18   --   --   O2SAT 39.0  --  77.0 75.0 98.0 91.4 73.3    CBC  Recent Labs Lab 06/11/13 0400 06/12/13 0427 06/13/13 0500  HGB 11.2* 10.0* 10.0*  HCT 33.0* 29.2* 30.3*  WBC 17.3*  13.3* 11.3*  PLT 286 248 251    COAGULATION  Recent Labs Lab 06/09/13 2237 06/13/13 0500  INR 1.22 1.16    CARDIAC    Recent Labs Lab 06/10/13 1119 06/10/13 1800 06/11/13 0005 06/12/13 0427 06/13/13 0500  TROPONINI >20.00* >20.00* >20.00* 19.79* 10.60*    Recent Labs Lab 06/12/13 0427  PROBNP 2889.0*     CHEMISTRY  Recent Labs Lab 06/10/13 0010 06/10/13 0418  06/10/13 1800 06/11/13 0005 06/11/13 0400 06/12/13 0427 06/13/13 0500  NA 122*  --   < > 142 137 139 139 137  K 4.8  --   < > 4.0 3.9 3.7 3.5* 3.3*  CL 89*  --   < > 105 104 105 102 100  CO2 16*  --   < > 22 23 24 27 27   GLUCOSE 729*  --   < > 127* 133* 131* 100* 122*  BUN 15  --   < > 17 15 14 11 10   CREATININE 1.05  --   < > 1.15 1.17 1.14 1.21 1.07  CALCIUM 6.5*  --   < > 6.8* 7.3* 7.4* 7.6* 7.7*  MG  --  2.0  --   --   --  1.6 1.9 2.2  PHOS  --  2.2*  --   --   --   --  1.6* 1.8*  < > = values in this  interval not displayed. Estimated Creatinine Clearance: 80.6 ml/min (by C-G formula based on Cr of 1.07).   LIVER  Recent Labs Lab 06/09/13 2237 06/12/13 0427 06/13/13 0500  AST  --  638*  --   ALT  --  1075*  --   ALKPHOS  --  119*  --   BILITOT  --  0.9  --   PROT  --  4.9*  --   ALBUMIN  --  2.3*  --   INR 1.22  --  1.16     INFECTIOUS  Recent Labs Lab 06/10/13 1800 06/11/13 0005 06/11/13 0400 06/12/13 0427 06/13/13 0500  LATICACIDVEN 2.3* 1.7  --   --  1.2  PROCALCITON  --   --  1.65 0.95  --      ENDOCRINE CBG (last 3)   Recent Labs  06/12/13 0744 06/12/13 1153 06/12/13 1648  GLUCAP 100* 101* 119*         IMAGING x48h  Dg Chest Port 1 View  06/13/2013   CLINICAL DATA:  Check endotracheal tube.  Shortness of breath.  EXAM: PORTABLE CHEST - 1 VIEW  COMPARISON:  Chest x-ray from yesterday  FINDINGS: Endotracheal tube ends in the region of the clavicular heads/mid thoracic trachea. Nasogastric tube is coiled in the fundus. Similar morphology of the left subclavian central line, tip in the region of the upper SVC. Vascular stent again seen near the great vessel takeoffs.  Unchanged cardiomegaly.  Status post CABG.  There is increasing interstitial prominence diffusely. Small left pleural effusion with left basilar opacity.  IMPRESSION: 1. Unchanged positioning of tubes and lines. 2. New pulmonary edema. 3. Small left pleural effusion with atelectasis.   Electronically Signed   By: Jorje Guild M.D.   On: 06/13/2013 06:55   Dg Chest Port 1 View  06/12/2013   CLINICAL DATA:  Status post ET tube advancement  EXAM: PORTABLE CHEST - 1 VIEW  COMPARISON:  06/12/2013, 06/11/2013  FINDINGS: Endotracheal tube with the tip 3.7 cm above the carina. Nasogastric tube coiled in the stomach. Left subclavian central  venous catheter with the tip projecting over the right lateral side wall of the SVC.  Bilateral trace pleural effusions. Mild bilateral interstitial thickening.  Stable cardiomediastinal silhouette. Prior CABG.  IMPRESSION: 1. Endotracheal tube with the tip 3.7 cm above the carina. 2. Mild pulmonary edema.   Electronically Signed   By: Kathreen Devoid   On: 06/12/2013 12:05   Dg Chest Port 1 View  06/12/2013   CLINICAL DATA:  Check endotracheal tube position  EXAM: PORTABLE CHEST - 1 VIEW  COMPARISON:  06/11/2013, 06/09/2013  FINDINGS: Endotracheal tube with the tip 5.8 cm above the carina. Left jugular central venous catheter with the tip projecting over the SVC. Nasogastric tube projecting over the stomach.  Bilateral interstitial thickening. Small left pleural effusion. No focal consolidation. No pneumothorax. Stable cardiomegaly. CABG. Unremarkable osseous structures.  IMPRESSION: 1. Endotracheal tube with the tip 5.8 cm above the carina. 2. Mild pulmonary edema of. No significant interval change from the prior exam when accounting for differences in technique.   Electronically Signed   By: Kathreen Devoid   On: 06/12/2013 07:13      ASSESSMENT / PLAN:  PULMONARY A: Acute respiratory failure in setting of cardiac arrest, cardiogenic shock Pulmonary edema Possible aspiration pneumonia  P:   SBT attempt noted with limited effort Assess abg for alkalosis Limit narcs pcxr in am  Upright position Re attempt weaning PS 10-12, adjust sens Albuterol PRN  CARDIOVASCULAR A:  S/p PEA arrest with Cardiogenic shock at admit 06/09/2013 Acute on chronic systolic CHF at admit 1/47/8295 - ef 15% R/o thrombosis of SVG-LAD graft stent  R/o Plavix resistance.  shock  P:  Goal MAP > 65 Levophed to map goals Cortisol brolenta, asa With continued shock, consider cath? cvp 5, consider bolus and assess response, was neg last 24 hrs, O2 needs low on vent  RENAL A:   AKI; resolved  - low K, low mag and low phos  P:   Replete k, phos cmet in am  kvo  GASTROINTESTINAL A:   GI Px Nutrition  Rising LFT - concern for shock liver. Cards stopped amio  06/12/13  P:   Protonix Monitor LFT in am Stop statin until LFt trend noted to improve Send acute hep panel Avoid tylenol more than 1 gram / day  HEMATOLOGIC A:   Heparinization for ACS Anemia P:  bralenta  Cbc in am   INFECTIOUS A:   NOT impressed infection , drop lactic, pct (unimpressed)  P:   Unasyn Will reduce duration, pct drop, re eval in am   ENDOCRINE A:   Hyperglycemia  P:   SSI cortisol  NEUROLOGIC A:   Acute encephalopathy-improved  P:   Maintain normothermia Change RASS goal: -1 fentanyl prn   GLOBAL:wean attempts, bolus small, cvp 5, dc pressors if able, consider cath Ccm time 35 min   Lavon Paganini. Titus Mould, MD, [place] Pgr: [facility]

## 2013-06-13 NOTE — Progress Notes (Signed)
PCCM Progress Note:  S:  Called to bedside after pt self extubated.  He now complains of mild chest discomfort (note, did receive CPR).  O:  Vitals reviewed.  On NRB, SpO2 high 90's.  Transitioned to [place] 6L, SpO2 93%. General:  Appears comfortable, in NAD. Lungs:  resp's even and unlabored.  Strong cough.  No stridor.  Heart:  RRR, no M/R/G. Ext:  Mild peripheral edema.  A/P:  S/p self extubation.  Appears comfortable on [place], exam WNL.  Not in any distress, has strong cough, no stridor.  Continue supplemental O2 to maintain SpO2 > 93%.  No immediate need for re-intubation.  Will continue to monitor.   Montey Hora, [facility] Pgr: [phone]  or [phone]

## 2013-06-13 NOTE — Progress Notes (Signed)
NUTRITION FOLLOW UP  Intervention:   - Continue TF via OGT of Vital AF 1.2 at 57m/hr and increase by 115mevery 4 hours to goal of 7031mr. Goal rate will provide 2016 calories, 126g protein, and 1362m71mee water and meet 97% estimated calorie needs, 114% estimated protein needs. If IVF d/c, recommend 120ml57mer flushes 5 times/day. - Continue adult enteral protocol - RD to continue to monitor   Nutrition Dx:   Inadequate oral intake related to inability to eat as evidenced by NPO status - ongoing    Goal:   Intake to meet >90% of estimated nutrition needs - not met but will meet once goal rate of TF reached   Monitor:   Weights, labs, TF tolerance/advancement, vent status  Assessment:   PMHx significant for MI. Admitted after collapse and PEA arrest at home.   5/29: -Brief physical assessment reveals no significant fat or muscle mass wasting.  5/31: -Adult Enteral Nutrition Protocol initiated, pt started on Vital AF 1.2 increase 10 ml every 4 hours as tolerated to goal of 70 ml/hr. Pt had TF residuals of 25ml 100m lower abdominal pain however family stated pt had hernia pain at baseline. Pt also expressed having nausea.   6/1: -Remains intubated. Had 180ml o25m residuals this morning.   Patient is currently intubated on ventilator support MV: 11.6 L/min Temp (24hrs), Avg:99.7 F (37.6 C), Min:98.6 F (37 C), Max:100.6 F (38.1 C)  Propofol: off   TF: Vital AF 1.2 at 50ml/hr20mrovides 1440 calories, 90g protein, 973ml fre8mter, meets 70% estimated calorie needs, 100% estimated protein needs  Potassium low, getting IV replacement Phosphorus low, getting IV replacement Magnesium WNL    Height: 06/09/13          _0  (1.753 m)    Weight Status:   Wt Readings from Last 1 Encounters:  06/13/13 193 lb 12.6 oz (87.9 kg)    Re-estimated needs:  Kcal: 2066 Protein: 87-110g Fluid: ~ 2L/day  Skin: Right arm laceration, left arm incision   Diet Order:  NPO   Intake/Output Summary (Last 24 hours) at 06/13/13 0822 Last data filed at 06/13/13 0700  Gross per 24 hour  Intake 3419.43 ml  Output   3305 ml  Net 114.43 ml    Last BM: PTA   Labs:   Recent Labs Lab 06/10/13 0418  06/11/13 0400 06/12/13 0427 06/13/13 0500  NA  --   < > 139 139 137  K  --   < > 3.7 3.5* 3.3*  CL  --   < > 105 102 100  CO2  --   < > _1 BUN  --   < > _2 CREATININE  --   < > 1.14 1.21 1.07  CALCIUM  --   < > 7.4* 7.6* 7.7*  MG 2.0  --  1.6 1.9 2.2  PHOS 2.2*  --   --  1.6* 1.8*  GLUCOSE  --   < > 131* 100* 122*  < > = values in this interval not displayed.  CBG (last 3)   Recent Labs  06/12/13 0744 06/12/13 1153 06/12/13 1648  GLUCAP 100* 101* 119*    Scheduled Meds: . ampicillin-sulbactam (UNASYN) IV  3 g Intravenous Q6H  . antiseptic oral rinse  15 mL Mouth Rinse 6 times per day  . aspirin  81 mg Per Tube Daily  . atorvastatin  80 mg Oral q1800  . chlorhexidine  15 mL Mouth/Throat BID  . insulin aspart  2-6 Units Subcutaneous 6 times per day  . pantoprazole (PROTONIX) IV  40 mg Intravenous QHS  . potassium chloride  10 mEq Intravenous Q1 Hr x 2  . sodium phosphate  Dextrose 5% IVPB  20 mmol Intravenous Once  . thiamine  100 mg Oral Daily   Or  . thiamine IV  100 mg Intravenous Daily  . ticagrelor  90 mg Oral BID    Continuous Infusions: . dextrose 5 % and 0.45% NaCl 1,000 mL (06/12/13 1900)  . feeding supplement (VITAL AF 1.2 CAL) 1,000 mL (06/13/13 0200)  . heparin 1,350 Units/hr (06/12/13 1700)  . norepinephrine (LEVOPHED) Adult infusion 4 mcg/min (06/13/13 0803)    Mikey College MS, RD, LDN [phone] Pager [phone] Weekend/After Hours Pager

## 2013-06-13 NOTE — Progress Notes (Signed)
Subjective:  Sleepy, but readily arousable and seems to comprehend speech, follows commands. Sedation was stopped 6 hours ago, but remains "groggy" and minute ventilation is only 2-3 L/min when on spontaneous mode. No arrhythmia, still on a small dose of norepi IV (4 mcg/kg/min), with excellent CO (co-ox 73.3) Troponin peaked at >20 , now rapidly decreasing.  [name] is a 58 y.o. male with PMH CAD, s/p 1v CABG (SVG to LAD in 2006), recent hospital admission at [facility] on 5/20 with NSTEMI and occlusion of SVG to LAD with stent placed 5/21, bilateral carotid stenosis, L subclavian stenosis, renal artery stenosis, ongoing tobacco use, who presents after unwitnessed cardiac arrest at home requring ~15 minutes of CPR and Epi x3 for ROSC with subsequent codes x2 in ED again. Intubated and sedated on arrival. Hypothermia protocol was initially started but subsequently stopped. Hypotensive requiring pressors. He has been febrile and was started on Unasyn for ?aspiration PNA.   Objective:  Temp:  [98.6 F (37 C)-100.6 F (38.1 C)] 98.6 F (37 C) (06/01 0700) Pulse Rate:  [59-85] 70 (06/01 0753) Resp:  [18-22] 20 (06/01 0753) BP: (78-131)/(43-74) 109/59 mmHg (06/01 0753) SpO2:  [95 %-100 %] 96 % (06/01 0753) FiO2 (%):  [40 %] 40 % (06/01 0753) Weight:  [193 lb 12.6 oz (87.9 kg)] 193 lb 12.6 oz (87.9 kg) (06/01 0500) Weight change: -3.5 oz (-0.1 kg)  Intake/Output from previous day: 05/31 0701 - 06/01 0700 In: 3475.1 [I.V.:1759.8; NG/GT:757.3; IV Piggyback:958] Out: 6384 [Urine:3455]  Intake/Output from this shift:    Medications: Current Facility-Administered Medications  Medication Dose Route Frequency Provider Last Rate Last Dose  . 0.9 %  sodium chloride infusion  250 mL Intravenous PRN Corey Harold, NP 10 mL/hr at 06/12/13 0000 250 mL at 06/12/13 0000  . acetaminophen (TYLENOL) solution 650 mg  650 mg Per Tube Q6H PRN Brand Males, MD   650 mg at 06/12/13 2345  . albuterol  (PROVENTIL) (2.5 MG/3ML) 0.083% nebulizer solution 2.5 mg  2.5 mg Nebulization Q4H PRN Konstantin Zubelevitskiy, MD      . Ampicillin-Sulbactam (UNASYN) 3 g in sodium chloride 0.9 % 100 mL IVPB  3 g Intravenous Q6H Rikki Spearing, RPH   3 g at 06/13/13 0525  . antiseptic oral rinse (BIOTENE) solution 15 mL  15 mL Mouth Rinse 6 times per day Brand Males, MD   15 mL at 06/13/13 0800  . aspirin chewable tablet 81 mg  81 mg Per Tube Daily Larey Dresser, MD   81 mg at 06/12/13 5364  . atorvastatin (LIPITOR) tablet 80 mg  80 mg Oral q1800 Jerene Pitch, MD   80 mg at 06/12/13 1821  . chlorhexidine (PERIDEX) 0.12 % solution 15 mL  15 mL Mouth/Throat BID Brand Males, MD   15 mL at 06/13/13 0812  . dextrose 5 %-0.45 % sodium chloride infusion   Intravenous Continuous Brand Males, MD 50 mL/hr at 06/12/13 1900 1,000 mL at 06/12/13 1900  . feeding supplement (VITAL AF 1.2 CAL) liquid 1,000 mL  1,000 mL Per Tube Continuous Darrol Jump, RD 50 mL/hr at 06/13/13 0200 1,000 mL at 06/13/13 0200  . fentaNYL (SUBLIMAZE) injection 100 mcg  100 mcg Intravenous Q15 min PRN Brand Males, MD   100 mcg at 06/12/13 2301  . fentaNYL (SUBLIMAZE) injection 100 mcg  100 mcg Intravenous Q2H PRN Brand Males, MD   100 mcg at 06/13/13 0142  . heparin ADULT infusion 100 units/mL (25000 units/250 mL)  1,350 Units/hr Intravenous Continuous Brand Males, MD 13.5 mL/hr at 06/12/13 1700 1,350 Units/hr at 06/12/13 1700  . insulin aspart (novoLOG) injection 2-6 Units  2-6 Units Subcutaneous 6 times per day Raylene Miyamoto, MD   2 Units at 06/11/13 2326  . norepinephrine (LEVOPHED) 16 mg in dextrose 5 % 250 mL infusion  2-50 mcg/min Intravenous Titrated Larey Dresser, MD 3.8 mL/hr at 06/13/13 0803 4 mcg/min at 06/13/13 0803  . ondansetron (ZOFRAN) injection 4 mg  4 mg Intravenous Q8H PRN Melvia Heaps, MD   4 mg at 06/12/13 1823  . pantoprazole (PROTONIX) injection 40 mg  40 mg Intravenous QHS Corey Harold, NP   40 mg at 06/12/13 2308  . potassium chloride 10 mEq in 50 mL *CENTRAL LINE* IVPB  10 mEq Intravenous Q1 Hr x 2 Rush Farmer, MD   10 mEq at 06/13/13 0729  . sodium phosphate 20 mmol in sodium chloride 0.9 % 250 mL infusion  20 mmol Intravenous Once Rush Farmer, MD      . thiamine (VITAMIN B-1) tablet 100 mg  100 mg Oral Daily Jerene Pitch, MD   100 mg at 06/12/13 2878   Or  . thiamine (B-1) injection 100 mg  100 mg Intravenous Daily Jerene Pitch, MD   100 mg at 06/11/13 0924  . ticagrelor (BRILINTA) tablet 90 mg  90 mg Oral BID Jerene Pitch, MD   90 mg at 06/12/13 2309    Physical Exam:  General: Alert, oriented x3, no distress Head: no evidence of trauma, PERRL, EOMI, no exophtalmos or lid lag, no myxedema, no xanthelasma; normal ears, nose and oropharynx Neck: normal jugular venous pulsations and no hepatojugular reflux; brisk carotid pulses without delay and no carotid bruits Chest: clear to auscultation, no signs of consolidation by percussion or palpation, normal fremitus, symmetrical and full respiratory excursions, sternotomy scar Cardiovascular: normal position and quality of the apical impulse, regular rhythm, normal first and second heart sounds, no murmurs, rubs or gallops Abdomen: no tenderness or distention, no masses by palpation, no abnormal pulsatility or arterial bruits, normal bowel sounds, no hepatosplenomegaly Extremities: no clubbing, cyanosis or edema; 2+ radial, ulnar and brachial pulses bilaterally; 2+ right femoral, weak posterior tibial and dorsalis pedis pulses; 2+ left femoral, weak posterior tibial and dorsalis pedis pulses; no subclavian or femoral bruits Neurological: grossly nonfocal   Lab Results: Results for orders placed during the hospital encounter of 06/09/13 (from the past 48 hour(s))  GLUCOSE, CAPILLARY     Status: Abnormal   Collection Time    06/11/13 12:26 PM      Result Value Ref Range   Glucose-Capillary 100 (*) 70 - 99  mg/dL  GLUCOSE, CAPILLARY     Status: Abnormal   Collection Time    06/11/13  4:46 PM      Result Value Ref Range   Glucose-Capillary 132 (*) 70 - 99 mg/dL  GLUCOSE, CAPILLARY     Status: Abnormal   Collection Time    06/11/13  8:15 PM      Result Value Ref Range   Glucose-Capillary 111 (*) 70 - 99 mg/dL  GLUCOSE, CAPILLARY     Status: Abnormal   Collection Time    06/11/13 11:23 PM      Result Value Ref Range   Glucose-Capillary 126 (*) 70 - 99 mg/dL  GLUCOSE, CAPILLARY     Status: Abnormal   Collection Time    06/12/13  4:03 AM  Result Value Ref Range   Glucose-Capillary 47 (*) 70 - 99 mg/dL  GLUCOSE, CAPILLARY     Status: Abnormal   Collection Time    06/12/13  4:26 AM      Result Value Ref Range   Glucose-Capillary 162 (*) 70 - 99 mg/dL  MAGNESIUM     Status: None   Collection Time    06/12/13  4:27 AM      Result Value Ref Range   Magnesium 1.9  1.5 - 2.5 mg/dL  PROCALCITONIN     Status: None   Collection Time    06/12/13  4:27 AM      Result Value Ref Range   Procalcitonin 0.95     Comment:            Interpretation:     PCT > 0.5 ng/mL and <= 2 ng/mL:     Systemic infection (sepsis) is possible,     but other conditions are known to elevate     PCT as well.     (NOTE)             ICU PCT Algorithm               Non ICU PCT Algorithm        ----------------------------     ------------------------------             PCT < 0.25 ng/mL                 PCT < 0.1 ng/mL         Stopping of antibiotics            Stopping of antibiotics           strongly encouraged.               strongly encouraged.        ----------------------------     ------------------------------           PCT level decrease by               PCT < 0.25 ng/mL           >= 80% from peak PCT           OR PCT 0.25 - 0.5 ng/mL          Stopping of antibiotics                                                 encouraged.         Stopping of antibiotics               encouraged.         ----------------------------     ------------------------------           PCT level decrease by              PCT >= 0.25 ng/mL           < 80% from peak PCT            AND PCT >= 0.5 ng/mL            Continuing antibiotics  encouraged.           Continuing antibiotics                encouraged.        ----------------------------     ------------------------------         PCT level increase compared          PCT > 0.5 ng/mL             with peak PCT AND              PCT >= 0.5 ng/mL             Escalation of antibiotics                                              strongly encouraged.          Escalation of antibiotics            strongly encouraged.  HEPARIN LEVEL (UNFRACTIONATED)     Status: Abnormal   Collection Time    06/12/13  4:27 AM      Result Value Ref Range   Heparin Unfractionated 0.18 (*) 0.30 - 0.70 IU/mL   Comment:            IF HEPARIN RESULTS ARE BELOW     EXPECTED VALUES, AND PATIENT     DOSAGE HAS BEEN CONFIRMED,     SUGGEST FOLLOW UP TESTING     OF ANTITHROMBIN III LEVELS.  COMPREHENSIVE METABOLIC PANEL     Status: Abnormal   Collection Time    06/12/13  4:27 AM      Result Value Ref Range   Sodium 139  137 - 147 mEq/L   Potassium 3.5 (*) 3.7 - 5.3 mEq/L   Chloride 102  96 - 112 mEq/L   CO2 27  19 - 32 mEq/L   Glucose, Bld 100 (*) 70 - 99 mg/dL   BUN 11  6 - 23 mg/dL   Creatinine, Ser 1.21  0.50 - 1.35 mg/dL   Calcium 7.6 (*) 8.4 - 10.5 mg/dL   Total Protein 4.9 (*) 6.0 - 8.3 g/dL   Albumin 2.3 (*) 3.5 - 5.2 g/dL   AST 638 (*) 0 - 37 U/L   ALT 1075 (*) 0 - 53 U/L   Alkaline Phosphatase 119 (*) 39 - 117 U/L   Total Bilirubin 0.9  0.3 - 1.2 mg/dL   GFR calc non Af Amer 63 (*) >90 mL/min   GFR calc Af Amer 73 (*) >90 mL/min   Comment: (NOTE)     The eGFR has been calculated using the CKD EPI equation.     This calculation has not been validated in all clinical situations.     eGFR's persistently <90  mL/min signify possible Chronic Kidney     Disease.  PHOSPHORUS     Status: Abnormal   Collection Time    06/12/13  4:27 AM      Result Value Ref Range   Phosphorus 1.6 (*) 2.3 - 4.6 mg/dL  CBC WITH DIFFERENTIAL     Status: Abnormal   Collection Time    06/12/13  4:27 AM      Result Value Ref Range   WBC 13.3 (*) 4.0 - 10.5 K/uL   RBC 3.12 (*) 4.22 - 5.81 MIL/uL   Hemoglobin 10.0 (*)  13.0 - 17.0 g/dL   HCT 29.2 (*) 39.0 - 52.0 %   MCV 93.6  78.0 - 100.0 fL   MCH 32.1  26.0 - 34.0 pg   MCHC 34.2  30.0 - 36.0 g/dL   RDW 14.8  11.5 - 15.5 %   Platelets 248  150 - 400 K/uL   Neutrophils Relative % 67  43 - 77 %   Neutro Abs 9.0 (*) 1.7 - 7.7 K/uL   Lymphocytes Relative 23  12 - 46 %   Lymphs Abs 3.1  0.7 - 4.0 K/uL   Monocytes Relative 9  3 - 12 %   Monocytes Absolute 1.1 (*) 0.1 - 1.0 K/uL   Eosinophils Relative 1  0 - 5 %   Eosinophils Absolute 0.1  0.0 - 0.7 K/uL   Basophils Relative 0  0 - 1 %   Basophils Absolute 0.0  0.0 - 0.1 K/uL  PRO B NATRIURETIC PEPTIDE     Status: Abnormal   Collection Time    06/12/13  4:27 AM      Result Value Ref Range   Pro B Natriuretic peptide (BNP) 2889.0 (*) 0 - 125 pg/mL  TROPONIN I     Status: Abnormal   Collection Time    06/12/13  4:27 AM      Result Value Ref Range   Troponin I 19.79 (*) <0.30 ng/mL   Comment:            Due to the release kinetics of cTnI,     a negative result within the first hours     of the onset of symptoms does not rule out     myocardial infarction with certainty.     If myocardial infarction is still suspected,     repeat the test at appropriate intervals.     CRITICAL VALUE NOTED.  VALUE IS CONSISTENT WITH PREVIOUSLY REPORTED AND CALLED VALUE.  HEMOGLOBIN A1C     Status: Abnormal   Collection Time    06/12/13  4:27 AM      Result Value Ref Range   Hemoglobin A1C 6.0 (*) <5.7 %   Comment: (NOTE)                                                                               According to the ADA  Clinical Practice Recommendations for 2011, when     HbA1c is used as a screening test:      >=6.5%   Diagnostic of Diabetes Mellitus               (if abnormal result is confirmed)     5.7-6.4%   Increased risk of developing Diabetes Mellitus     References:Diagnosis and Classification of Diabetes Mellitus,Diabetes     SAYT,0160,10(XNATF 1):S62-S69 and Standards of Medical Care in             Diabetes - 2011,Diabetes Care,2011,34 (Suppl 1):S11-S61.   Mean Plasma Glucose 126 (*) <117 mg/dL   Comment: Performed at [facility]     Status: Abnormal   Collection Time    06/12/13  4:30 AM      Result Value Ref Range  Total hemoglobin 7.1 (*) 13.5 - 18.0 g/dL   O2 Saturation 91.4     Carboxyhemoglobin 1.6 (*) 0.5 - 1.5 %   Methemoglobin 1.2  0.0 - 1.5 %  GLUCOSE, CAPILLARY     Status: Abnormal   Collection Time    06/12/13  6:20 AM      Result Value Ref Range   Glucose-Capillary 114 (*) 70 - 99 mg/dL  GLUCOSE, CAPILLARY     Status: Abnormal   Collection Time    06/12/13  7:44 AM      Result Value Ref Range   Glucose-Capillary 100 (*) 70 - 99 mg/dL  GLUCOSE, CAPILLARY     Status: Abnormal   Collection Time    06/12/13 11:53 AM      Result Value Ref Range   Glucose-Capillary 101 (*) 70 - 99 mg/dL  HEPARIN LEVEL (UNFRACTIONATED)     Status: None   Collection Time    06/12/13 12:43 PM      Result Value Ref Range   Heparin Unfractionated 0.30  0.30 - 0.70 IU/mL   Comment:            IF HEPARIN RESULTS ARE BELOW     EXPECTED VALUES, AND PATIENT     DOSAGE HAS BEEN CONFIRMED,     SUGGEST FOLLOW UP TESTING     OF ANTITHROMBIN III LEVELS.  GLUCOSE, CAPILLARY     Status: Abnormal   Collection Time    06/12/13  4:48 PM      Result Value Ref Range   Glucose-Capillary 119 (*) 70 - 99 mg/dL  MAGNESIUM     Status: None   Collection Time    06/13/13  5:00 AM      Result Value Ref Range   Magnesium 2.2  1.5 - 2.5 mg/dL  HEPARIN LEVEL (UNFRACTIONATED)      Status: Abnormal   Collection Time    06/13/13  5:00 AM      Result Value Ref Range   Heparin Unfractionated 0.26 (*) 0.30 - 0.70 IU/mL   Comment:            IF HEPARIN RESULTS ARE BELOW     EXPECTED VALUES, AND PATIENT     DOSAGE HAS BEEN CONFIRMED,     SUGGEST FOLLOW UP TESTING     OF ANTITHROMBIN III LEVELS.  PHOSPHORUS     Status: Abnormal   Collection Time    06/13/13  5:00 AM      Result Value Ref Range   Phosphorus 1.8 (*) 2.3 - 4.6 mg/dL  CBC WITH DIFFERENTIAL     Status: Abnormal   Collection Time    06/13/13  5:00 AM      Result Value Ref Range   WBC 11.3 (*) 4.0 - 10.5 K/uL   RBC 3.18 (*) 4.22 - 5.81 MIL/uL   Hemoglobin 10.0 (*) 13.0 - 17.0 g/dL   HCT 30.3 (*) 39.0 - 52.0 %   MCV 95.3  78.0 - 100.0 fL   MCH 31.4  26.0 - 34.0 pg   MCHC 33.0  30.0 - 36.0 g/dL   RDW 14.8  11.5 - 15.5 %   Platelets 251  150 - 400 K/uL   Neutrophils Relative % 67  43 - 77 %   Neutro Abs 7.6  1.7 - 7.7 K/uL   Lymphocytes Relative 18  12 - 46 %   Lymphs Abs 2.0  0.7 - 4.0 K/uL   Monocytes Relative 13 (*) 3 -  12 %   Monocytes Absolute 1.4 (*) 0.1 - 1.0 K/uL   Eosinophils Relative 2  0 - 5 %   Eosinophils Absolute 0.3  0.0 - 0.7 K/uL   Basophils Relative 0  0 - 1 %   Basophils Absolute 0.0  0.0 - 0.1 K/uL  BASIC METABOLIC PANEL     Status: Abnormal   Collection Time    06/13/13  5:00 AM      Result Value Ref Range   Sodium 137  137 - 147 mEq/L   Potassium 3.3 (*) 3.7 - 5.3 mEq/L   Chloride 100  96 - 112 mEq/L   CO2 27  19 - 32 mEq/L   Glucose, Bld 122 (*) 70 - 99 mg/dL   BUN 10  6 - 23 mg/dL   Creatinine, Ser 1.07  0.50 - 1.35 mg/dL   Calcium 7.7 (*) 8.4 - 10.5 mg/dL   GFR calc non Af Amer 74 (*) >90 mL/min   GFR calc Af Amer 85 (*) >90 mL/min   Comment: (NOTE)     The eGFR has been calculated using the CKD EPI equation.     This calculation has not been validated in all clinical situations.     eGFR's persistently <90 mL/min signify possible Chronic Kidney     Disease.    CARBOXYHEMOGLOBIN     Status: Abnormal   Collection Time    06/13/13  5:00 AM      Result Value Ref Range   Total hemoglobin 8.6 (*) 13.5 - 18.0 g/dL   O2 Saturation 73.3     Carboxyhemoglobin 1.5  0.5 - 1.5 %   Methemoglobin 1.1  0.0 - 1.5 %  PROTIME-INR     Status: None   Collection Time    06/13/13  5:00 AM      Result Value Ref Range   Prothrombin Time 14.6  11.6 - 15.2 seconds   INR 1.16  0.00 - 1.49  TROPONIN I     Status: Abnormal   Collection Time    06/13/13  5:00 AM      Result Value Ref Range   Troponin I 10.60 (*) <0.30 ng/mL   Comment:            Due to the release kinetics of cTnI,     a negative result within the first hours     of the onset of symptoms does not rule out     myocardial infarction with certainty.     If myocardial infarction is still suspected,     repeat the test at appropriate intervals.     CRITICAL VALUE NOTED.  VALUE IS CONSISTENT WITH PREVIOUSLY REPORTED AND CALLED VALUE.  LACTIC ACID, PLASMA     Status: None   Collection Time    06/13/13  5:00 AM      Result Value Ref Range   Lactic Acid, Venous 1.2  0.5 - 2.2 mmol/L    Imaging: Imaging results have been reviewed and Dg Chest Port 1 View  06/13/2013   CLINICAL DATA:  Check endotracheal tube.  Shortness of breath.  EXAM: PORTABLE CHEST - 1 VIEW  COMPARISON:  Chest x-ray from yesterday  FINDINGS: Endotracheal tube ends in the region of the clavicular heads/mid thoracic trachea. Nasogastric tube is coiled in the fundus. Similar morphology of the left subclavian central line, tip in the region of the upper SVC. Vascular stent again seen near the great vessel takeoffs.  Unchanged cardiomegaly.  Status post  CABG.  There is increasing interstitial prominence diffusely. Small left pleural effusion with left basilar opacity.  IMPRESSION: 1. Unchanged positioning of tubes and lines. 2. New pulmonary edema. 3. Small left pleural effusion with atelectasis.   Electronically Signed   By: Jorje Guild  M.D.   On: 06/13/2013 06:55   Dg Chest Port 1 View  06/12/2013   CLINICAL DATA:  Status post ET tube advancement  EXAM: PORTABLE CHEST - 1 VIEW  COMPARISON:  06/12/2013, 06/11/2013  FINDINGS: Endotracheal tube with the tip 3.7 cm above the carina. Nasogastric tube coiled in the stomach. Left subclavian central venous catheter with the tip projecting over the right lateral side wall of the SVC.  Bilateral trace pleural effusions. Mild bilateral interstitial thickening. Stable cardiomediastinal silhouette. Prior CABG.  IMPRESSION: 1. Endotracheal tube with the tip 3.7 cm above the carina. 2. Mild pulmonary edema.   Electronically Signed   By: Kathreen Devoid   On: 06/12/2013 12:05   Dg Chest Port 1 View  06/12/2013   CLINICAL DATA:  Check endotracheal tube position  EXAM: PORTABLE CHEST - 1 VIEW  COMPARISON:  06/11/2013, 06/09/2013  FINDINGS: Endotracheal tube with the tip 5.8 cm above the carina. Left jugular central venous catheter with the tip projecting over the SVC. Nasogastric tube projecting over the stomach.  Bilateral interstitial thickening. Small left pleural effusion. No focal consolidation. No pneumothorax. Stable cardiomegaly. CABG. Unremarkable osseous structures.  IMPRESSION: 1. Endotracheal tube with the tip 5.8 cm above the carina. 2. Mild pulmonary edema of. No significant interval change from the prior exam when accounting for differences in technique.   Electronically Signed   By: Kathreen Devoid   On: 06/12/2013 07:13    Assessment:  1. Active Problems: 2.   Cardiac arrest 3.   Acute respiratory failure with hypoxia 4.   Cardiogenic shock 5.   Cardiogenic pulmonary edema 6. Anterior wall MI, evolving  Plan:  1. Mild cardiogenic shock, resolving, likely will come off pressors this AM 2. Acute respiratory failure, now with hypoventilation despite sedation being stopped - check for alkalosis, consider prolonged sedative effect due to hepatic insufficiency/shock liver 3. Severely  prolonged QT and deep broad anterior T waves do suggest some degree of anterior wall stunning/potential for recovery, despite the severity of enzyme release. 4. Plan coronary angiography once extubated.  Remains critically ill.  Time Spent Directly with Patient:  60 minutes  Length of Stay:  LOS: 4 days    Rashaunda Rahl 06/13/2013, 8:12 AM

## 2013-06-13 NOTE — Progress Notes (Signed)
Pt C/O nausea- residuals checked - 200cc.. TF held and Zofran given. Will continue to monitor pt.

## 2013-06-13 NOTE — Procedures (Signed)
Extubation Procedure Note  Patient Details:   Name: [name] DOB: June 23, 1955 MRN: [id number]    Pt self extubated. RN had placed pt on NRB post self extubation. Attempted to wean to 6L [place] with SpO2 low 90s. Placed pt back on NRB for SpO2. Pt oriented to place and year. MD saw pt at bedside. No distress noted. Vitals WNL. Family and RN at bedside. RT will continue to monitor.    Evaluation  O2 sats: stable throughout Complications: No apparent complications Bilateral Breath Sounds: Diminished Suctioning: Airway Pt ability to speak: Yes  Doroteo Bradford 06/13/2013, 9:47 PM

## 2013-06-13 NOTE — Progress Notes (Signed)
ANTICOAGULATION CONSULT NOTE - Follow Up Consult  Pharmacy Consult for Heparin  Indication: chest pain/ACS, cardiac arrest  No Known Allergies  Patient Measurements: Height: 5\' 9"  (175.3 cm) Weight: 193 lb 12.6 oz (87.9 kg) IBW/kg (Calculated) : 70.7 Heparin Dosing Weight: 89 kg  Vital Signs: Temp: 99 F (37.2 C) (06/01 0829) BP: 109/59 mmHg (06/01 0753) Pulse Rate: 70 (06/01 0753)  Labs:  Recent Labs  06/11/13 0005  06/11/13 0400 06/12/13 0427 06/12/13 1243 06/13/13 0500  HGB  --   < > 11.2* 10.0*  --  10.0*  HCT  --   --  33.0* 29.2*  --  30.3*  PLT  --   --  286 248  --  251  LABPROT  --   --   --   --   --  14.6  INR  --   --   --   --   --  1.16  HEPARINUNFRC 0.44  --  0.34 0.18* 0.30 0.26*  CREATININE 1.17  --  1.14 1.21  --  1.07  TROPONINI >20.00*  --   --  19.79*  --  10.60*  < > = values in this interval not displayed.  Estimated Creatinine Clearance: 80.6 ml/min (by C-G formula based on Cr of 1.07).  Medical History: Past Medical History  Diagnosis Date  . Acute MI    Assessment: 58 y/o M unwitnessed cardiac arrest with ROSC after ACLS on heparin per pharmacy.  Heparin level slightly subtherapeutic  Goal of Therapy:  Heparin level 0.3-0.7 units/ml Monitor platelets by anticoagulation protocol: Yes   Plan:  -Increase heparin drip slightly to 1500 units/hr to maintain in goal range -Daily CBC/HL -Monitor for bleeding  Thank you. Anette Guarneri, PharmD [phone] 06/13/2013 9:01 AM

## 2013-06-14 ENCOUNTER — Inpatient Hospital Stay (HOSPITAL_COMMUNITY): Payer: Medicaid Other

## 2013-06-14 DIAGNOSIS — J9600 Acute respiratory failure, unspecified whether with hypoxia or hypercapnia: Secondary | ICD-10-CM

## 2013-06-14 DIAGNOSIS — R570 Cardiogenic shock: Secondary | ICD-10-CM

## 2013-06-14 LAB — HEPARIN LEVEL (UNFRACTIONATED): Heparin Unfractionated: 0.3 IU/mL (ref 0.30–0.70)

## 2013-06-14 LAB — COMPREHENSIVE METABOLIC PANEL
ALT: 541 U/L — ABNORMAL HIGH (ref 0–53)
AST: 145 U/L — ABNORMAL HIGH (ref 0–37)
Albumin: 2.5 g/dL — ABNORMAL LOW (ref 3.5–5.2)
Alkaline Phosphatase: 239 U/L — ABNORMAL HIGH (ref 39–117)
BILIRUBIN TOTAL: 0.9 mg/dL (ref 0.3–1.2)
BUN: 10 mg/dL (ref 6–23)
CHLORIDE: 103 meq/L (ref 96–112)
CO2: 26 meq/L (ref 19–32)
Calcium: 8.3 mg/dL — ABNORMAL LOW (ref 8.4–10.5)
Creatinine, Ser: 1.11 mg/dL (ref 0.50–1.35)
GFR, EST AFRICAN AMERICAN: 82 mL/min — AB (ref >90)
GFR, EST NON AFRICAN AMERICAN: 70 mL/min — AB (ref >90)
Glucose, Bld: 111 mg/dL — ABNORMAL HIGH (ref 70–99)
Potassium: 4.5 mEq/L (ref 3.7–5.3)
Sodium: 138 mEq/L (ref 137–147)
Total Protein: 5.8 g/dL — ABNORMAL LOW (ref 6.0–8.3)

## 2013-06-14 LAB — CBC WITH DIFFERENTIAL/PLATELET
BASOS PCT: 0 % (ref 0–1)
Basophils Absolute: 0 10*3/uL (ref 0.0–0.1)
Eosinophils Absolute: 0.1 10*3/uL (ref 0.0–0.7)
Eosinophils Relative: 1 % (ref 0–5)
HEMATOCRIT: 30.5 % — AB (ref 39.0–52.0)
HEMOGLOBIN: 10.1 g/dL — AB (ref 13.0–17.0)
Lymphocytes Relative: 11 % — ABNORMAL LOW (ref 12–46)
Lymphs Abs: 1.4 10*3/uL (ref 0.7–4.0)
MCH: 31.8 pg (ref 26.0–34.0)
MCHC: 33.1 g/dL (ref 30.0–36.0)
MCV: 95.9 fL (ref 78.0–100.0)
MONO ABS: 1.6 10*3/uL — AB (ref 0.1–1.0)
MONOS PCT: 13 % — AB (ref 3–12)
Neutro Abs: 9.5 10*3/uL — ABNORMAL HIGH (ref 1.7–7.7)
Neutrophils Relative %: 75 % (ref 43–77)
Platelets: 287 10*3/uL (ref 150–400)
RBC: 3.18 MIL/uL — ABNORMAL LOW (ref 4.22–5.81)
RDW: 15.1 % (ref 11.5–15.5)
WBC: 12.6 10*3/uL — ABNORMAL HIGH (ref 4.0–10.5)

## 2013-06-14 LAB — GLUCOSE, CAPILLARY
GLUCOSE-CAPILLARY: 118 mg/dL — AB (ref 70–99)
GLUCOSE-CAPILLARY: 97 mg/dL (ref 70–99)
GLUCOSE-CAPILLARY: 101 mg/dL — AB (ref 70–99)

## 2013-06-14 LAB — PHOSPHORUS: PHOSPHORUS: 3.5 mg/dL (ref 2.3–4.6)

## 2013-06-14 MED ORDER — ASPIRIN 81 MG PO CHEW
81.0000 mg | CHEWABLE_TABLET | ORAL | Status: AC
Start: 2013-06-15 — End: 2013-06-15
  Administered 2013-06-15: 81 mg via ORAL
  Filled 2013-06-14: qty 1

## 2013-06-14 MED ORDER — WHITE PETROLATUM GEL
Status: AC
  Administered 2013-06-14: 21:00:00
  Filled 2013-06-14: qty 5

## 2013-06-14 MED ORDER — SODIUM CHLORIDE 0.9 % IJ SOLN: 3.0000 mL | Freq: Two times a day (BID) | INTRAMUSCULAR | Status: DC

## 2013-06-14 MED ORDER — SODIUM CHLORIDE 0.9 % IV SOLN
1.0000 mL/kg/h | INTRAVENOUS | Status: DC
  Administered 2013-06-15: 1 mL/kg/h via INTRAVENOUS

## 2013-06-14 MED ORDER — ALPRAZOLAM 0.25 MG PO TABS
0.2500 mg | ORAL_TABLET | Freq: Two times a day (BID) | ORAL | Status: DC | PRN
  Administered 2013-06-14 – 2013-06-20 (×12): 0.25 mg via ORAL
  Filled 2013-06-14 (×12): qty 1

## 2013-06-14 MED ORDER — BIOTENE DRY MOUTH MT LIQD
15.0000 mL | Freq: Two times a day (BID) | OROMUCOSAL | Status: DC
  Administered 2013-06-15 – 2013-06-18 (×5): 15 mL via OROMUCOSAL

## 2013-06-14 MED ORDER — BLISTEX MEDICATED EX OINT
TOPICAL_OINTMENT | CUTANEOUS | Status: DC | PRN
  Administered 2013-06-14: 14:00:00 via TOPICAL
  Filled 2013-06-14: qty 10

## 2013-06-14 MED ORDER — SODIUM CHLORIDE 0.9 % IV SOLN
250.0000 mL | INTRAVENOUS | Status: DC | PRN
Start: 2013-06-14 — End: 2013-06-15

## 2013-06-14 MED ORDER — SODIUM CHLORIDE 0.9 % IJ SOLN: 3.0000 mL | INTRAMUSCULAR | Status: DC | PRN

## 2013-06-14 NOTE — Progress Notes (Signed)
PULMONARY / CRITICAL CARE MEDICINE   Name: [name] MRN: [id number] DOB: 05/07/1955    ADMISSION DATE:  06/09/2013 CONSULTATION DATE:  06/09/2013  REFERRING MD :  EDP PRIMARY SERVICE: PCCM  CHIEF COMPLAINT:  Cardiac arrest  BRIEF PATIENT DESCRIPTION:  58 yo with significant cardiac history, s/p CABG and recent PTCA / stent ( rethrombosed ). Brought to [facility] ED 5/28 after collapse and PEA arrest at home. Unknown downtime, CPR by family x 5 minutes and ACLS x 15 minutes with ROSC.  Repeated cardiac arrest in ED x 2.   LINES / TUBES: OETT 5/29 >>>seld dc 6/2 OGT 5/29 >>> L [place] CVL 5/29 >>> Foley 5/29 >>>  CULTURES: Blood 5/29 >>> Urine 5/29 >>>  ANTIBIOTICS: Unasyn 5/29 >>>  SIGNIFICANT EVENTS / STUDIES:  5/19  MI with PTCA at [facility], stent thrombosis 5/28  PEA arrest, decision against cardiac cath and hypothermia 5/28  Refractory hypoxemia, Nimbex started 06/11/13:  Nimbex continues, on levophed, on deep sedation. Fio2 down to 40%, Daughter at bedside 06/12/13: Off nimbex.  Still on levophed at 48mcg. Having fever 38.6C.  Folllows commands, mouths words appropriately : on fent gtt. Having some hypoglycemia 6/1 seld extubated  SUBJECTIVE/OVERNIGHT/INTERVAL HX: no distress  VITAL SIGNS: Temp:  [98.4 F (36.9 C)-100.8 F (38.2 C)] 99.5 F (37.5 C) (06/02 1000) Pulse Rate:  [66-116] 92 (06/02 1000) Resp:  [8-24] 14 (06/02 1000) BP: (84-142)/(52-74) 118/61 mmHg (06/02 1000) SpO2:  [95 %-100 %] 97 % (06/02 1000) FiO2 (%):  [40 %-100 %] 100 % (06/01 2200) Weight:  [87.5 kg (192 lb 14.4 oz)] 87.5 kg (192 lb 14.4 oz) (06/02 0418)  HEMODYNAMICS: CVP:  [5 mmHg-11 mmHg] 5 mmHg  VENTILATOR SETTINGS: Vent Mode:  [-] PRVC FiO2 (%):  [40 %-100 %] 100 % Set Rate:  [20 bmp] 20 bmp Vt Set:  [570 mL] 570 mL PEEP:  [5 cmH20] 5 cmH20 Plateau Pressure:  [18 cmH20] 18 cmH20 INTAKE / OUTPUT: Intake/Output     06/01 0701 - 06/02 0700 06/02 0701 - 06/03 0700   I.V. (mL/kg) 1452.8  (16.6)    NG/GT 780    IV Piggyback 1300    Total Intake(mL/kg) 3532.8 (40.4)    Urine (mL/kg/hr) 2450 (1.2) 300 (0.8)   Total Output 2450 300   Net +1082.8 -300         PHYSICAL EXAMINATION: General:  Appears acutely ill Neuro:  Nonfocal, awake HEENT:  PERRL Cardiovascular:  RRR, no m/r/g Lungs:  cta Abdomen:  Soft, nontender, bowel sounds wnl Musculoskeletal:  Moves all extremities, no edema Skin:  Intact  LABS:  PULMONARY  Recent Labs Lab 06/09/13 2235 06/09/13 2245 06/09/13 2248 06/10/13 0012 06/10/13 0326 06/12/13 0430 06/13/13 0500  PHART  --   --  7.076* 7.063* 7.302*  --   --   PCO2ART  --   --  56.8* 62.1* 33.7*  --   --   PO2ART  --   --  58.0* 56.0* 118.0*  --   --   HCO3 20.2  --  16.7* 17.7* 16.8*  --   --   TCO2 22 19 18 20 18   --   --   O2SAT 39.0  --  77.0 75.0 98.0 91.4 73.3    CBC  Recent Labs Lab 06/12/13 0427 06/13/13 0500 06/14/13 0430  HGB 10.0* 10.0* 10.1*  HCT 29.2* 30.3* 30.5*  WBC 13.3* 11.3* 12.6*  PLT 248 251 287    COAGULATION  Recent Labs Lab 06/09/13  2237 06/13/13 0500  INR 1.22 1.16    CARDIAC    Recent Labs Lab 06/10/13 1119 06/10/13 1800 06/11/13 0005 06/12/13 0427 06/13/13 0500  TROPONINI >20.00* >20.00* >20.00* 19.79* 10.60*    Recent Labs Lab 06/12/13 0427  PROBNP 2889.0*     CHEMISTRY  Recent Labs Lab 06/10/13 0010 06/10/13 0418  06/11/13 0005 06/11/13 0400 06/12/13 0427 06/13/13 0500 06/14/13 0430  NA 122*  --   < > 137 139 139 137 138  K 4.8  --   < > 3.9 3.7 3.5* 3.3* 4.5  CL 89*  --   < > 104 105 102 100 103  CO2 16*  --   < > 23 24 27 27 26   GLUCOSE 729*  --   < > 133* 131* 100* 122* 111*  BUN 15  --   < > 15 14 11 10 10   CREATININE 1.05  --   < > 1.17 1.14 1.21 1.07 1.11  CALCIUM 6.5*  --   < > 7.3* 7.4* 7.6* 7.7* 8.3*  MG  --  2.0  --   --  1.6 1.9 2.2  --   PHOS  --  2.2*  --   --   --  1.6* 1.8* 3.5  < > = values in this interval not displayed. Estimated Creatinine  Clearance: 77.5 ml/min (by C-G formula based on Cr of 1.11).   LIVER  Recent Labs Lab 06/09/13 2237 06/12/13 0427 06/13/13 0500 06/14/13 0430  AST  --  638* 289* 145*  ALT  --  1075* 811* 541*  ALKPHOS  --  119* 151* 239*  BILITOT  --  0.9 0.9 0.9  PROT  --  4.9* 5.3* 5.8*  ALBUMIN  --  2.3* 2.4* 2.5*  INR 1.22  --  1.16  --      INFECTIOUS  Recent Labs Lab 06/10/13 1800 06/11/13 0005 06/11/13 0400 06/12/13 0427 06/13/13 0500  LATICACIDVEN 2.3* 1.7  --   --  1.2  PROCALCITON  --   --  1.65 0.95  --      ENDOCRINE CBG (last 3)   Recent Labs  06/13/13 1937 06/13/13 2341 06/14/13 0828  GLUCAP 102* 109* 97         IMAGING x48h  Dg Chest Port 1 View  06/14/2013   CLINICAL DATA:  Check endotracheal tube.  EXAM: PORTABLE CHEST - 1 VIEW  COMPARISON:  06/13/2013.  FINDINGS: Interval tracheal and esophageal extubation. A left subclavian central line ends in stable position.  Mild cardiomegaly shows no interval change.  Status post CABG.  Small left pleural effusion, some of which may be sub pulmonic based on the apparent morphology of the diaphragm. There is still interstitial coarsening and cephalized blood flow. Atelectasis around the right minor fissure.  IMPRESSION: Unchanged pulmonary edema and small left pleural effusion.   Electronically Signed   By: Jorje Guild M.D.   On: 06/14/2013 06:27   Dg Chest Port 1 View  06/13/2013   CLINICAL DATA:  Check endotracheal tube.  Shortness of breath.  EXAM: PORTABLE CHEST - 1 VIEW  COMPARISON:  Chest x-ray from yesterday  FINDINGS: Endotracheal tube ends in the region of the clavicular heads/mid thoracic trachea. Nasogastric tube is coiled in the fundus. Similar morphology of the left subclavian central line, tip in the region of the upper SVC. Vascular stent again seen near the great vessel takeoffs.  Unchanged cardiomegaly.  Status post CABG.  There is increasing interstitial prominence  diffusely. Small left pleural  effusion with left basilar opacity.  IMPRESSION: 1. Unchanged positioning of tubes and lines. 2. New pulmonary edema. 3. Small left pleural effusion with atelectasis.   Electronically Signed   By: Jorje Guild M.D.   On: 06/13/2013 06:55   Dg Chest Port 1 View  06/12/2013   CLINICAL DATA:  Status post ET tube advancement  EXAM: PORTABLE CHEST - 1 VIEW  COMPARISON:  06/12/2013, 06/11/2013  FINDINGS: Endotracheal tube with the tip 3.7 cm above the carina. Nasogastric tube coiled in the stomach. Left subclavian central venous catheter with the tip projecting over the right lateral side wall of the SVC.  Bilateral trace pleural effusions. Mild bilateral interstitial thickening. Stable cardiomediastinal silhouette. Prior CABG.  IMPRESSION: 1. Endotracheal tube with the tip 3.7 cm above the carina. 2. Mild pulmonary edema.   Electronically Signed   By: Kathreen Devoid   On: 06/12/2013 12:05      ASSESSMENT / PLAN:  PULMONARY A: Acute respiratory failure in setting of cardiac arrest, cardiogenic shock Pulmonary edema Possible aspiration pneumonia Self extubated 6/1  P:   O2 Upright is Albuterol PRN  CARDIOVASCULAR A:  S/p PEA arrest with Cardiogenic shock at admit 06/09/2013 Acute on chronic systolic CHF at admit 7/61/9509 - ef 15% R/o thrombosis of SVG-LAD graft stent  R/o Plavix resistance.  Shock resolved  P:  Came  Off pressors after bolus Tele Per cards to cath lab  RENAL A:   AKI; resolved  P:   bmet in am  kvo  GASTROINTESTINAL A:   GI Px Nutrition  Rising LFT - concern for shock liver. Cards stopped amio 06/12/13, resolving  P:   Protonix Stop statin until LFt trend noted to improve acute hep panel - neg Avoid tylenol more than 1 gram / day  HEMATOLOGIC A:   Heparinization for ACS Anemia P:  anticoagulation Cbc in am   INFECTIOUS A:   NOT impressed infection , drop lactic, pct (unimpressed)  P:   Unasyn dc Pct neg x 2, good clinical status, low  suspcion  ENDOCRINE A:   Hyperglycemia  P:   SSI Cortisol 6, but off pressors now, if drops BP then add stress roids  NEUROLOGIC A:   Acute encephalopathy-improved  P:   Maintain normothermia Will pt after ctah done   Consider to cards service, will discuss with them is  Lavon Paganini. Titus Mould, MD, [place] Pgr: [facility]

## 2013-06-14 NOTE — Progress Notes (Signed)
Patient Name: [name] Date of Encounter: 06/14/2013  Active Problems:   Cardiac arrest   Acute respiratory failure with hypoxia   Cardiogenic shock   Cardiogenic pulmonary edema   Length of Stay: 5  SUBJECTIVE  Self-extubated - breathing calmly. Sleepy, mildly disoriented. Low grade fever. No angina. No arrhythmia. Off pressors.  [name] is a 58 y.o. male with PMH CAD, s/p 1v CABG (SVG to LAD in 2006), recent hospital admission at [facility] on 5/20 with NSTEMI and occlusion of SVG to LAD with stent placed 5/21, bilateral carotid stenosis, L subclavian stenosis, renal artery stenosis, ongoing tobacco use, who presents after unwitnessed cardiac arrest at home requring ~15 minutes of CPR and Epi x3 for ROSC with subsequent codes x2 in ED again. Intubated and sedated on arrival. Hypothermia protocol was initially started but subsequently stopped. Hypotensive requiring pressors. He has been febrile and was started on Unasyn for ?aspiration PNA.    CURRENT MEDS . ampicillin-sulbactam (UNASYN) IV  3 g Intravenous Q6H  . antiseptic oral rinse  15 mL Mouth Rinse BID  . aspirin  81 mg Per Tube Daily  . chlorhexidine  15 mL Mouth/Throat BID  . insulin aspart  2-6 Units Subcutaneous 6 times per day  . pantoprazole (PROTONIX) IV  40 mg Intravenous QHS  . thiamine  100 mg Oral Daily   Or  . thiamine IV  100 mg Intravenous Daily  . ticagrelor  90 mg Oral BID    OBJECTIVE   Intake/Output Summary (Last 24 hours) at 06/14/13 0837 Last data filed at 06/14/13 0700  Gross per 24 hour  Intake 3092.7 ml  Output   2450 ml  Net  642.7 ml   Filed Weights   06/12/13 0412 06/13/13 0500 06/14/13 0418  Weight: 194 lb 0.1 oz (88 kg) 193 lb 12.6 oz (87.9 kg) 192 lb 14.4 oz (87.5 kg)    PHYSICAL EXAM Filed Vitals:   06/14/13 0418 06/14/13 0500 06/14/13 0600 06/14/13 0700  BP:  108/61 109/66 113/64  Pulse:  102 94 92  Temp:  99.9 F (37.7 C) 99.5 F (37.5 C)   TempSrc:      Resp:   10 13 13   Height:      Weight: 192 lb 14.4 oz (87.5 kg)     SpO2:  96% 97% 97%   General: Sleepy, readily awakes to conversational tone, oriented x1, no distress Head: no evidence of trauma, PERRL, EOMI, no exophtalmos or lid lag, no myxedema, no xanthelasma; normal ears, nose and oropharynx Neck: normal jugular venous pulsations and no hepatojugular reflux; brisk carotid pulses without delay and no carotid bruits Chest: clear to auscultation, no signs of consolidation by percussion or palpation, normal fremitus, symmetrical and full respiratory excursions Cardiovascular: normal position and quality of the apical impulse, regular rhythm, normal first and second heart sounds, no rubs or gallops, no murmur Abdomen: no tenderness or distention, no masses by palpation, no abnormal pulsatility or arterial bruits, normal bowel sounds, no hepatosplenomegaly Extremities: no clubbing, cyanosis or edema; 2+ radial, ulnar and brachial pulses bilaterally; 2+ right femoral, posterior tibial and dorsalis pedis pulses; 2+ left femoral, posterior tibial and dorsalis pedis pulses; no subclavian or femoral bruits Neurological: grossly nonfocal  LABS  CBC  Recent Labs  06/13/13 0500 06/14/13 0430  WBC 11.3* 12.6*  NEUTROABS 7.6 9.5*  HGB 10.0* 10.1*  HCT 30.3* 30.5*  MCV 95.3 95.9  PLT 251 149   Basic Metabolic Panel  Recent Labs  06/12/13  0427 06/13/13 0500 06/14/13 0430  NA 139 137 138  K 3.5* 3.3* 4.5  CL 102 100 103  CO2 27 27 26   GLUCOSE 100* 122* 111*  BUN 11 10 10   CREATININE 1.21 1.07 1.11  CALCIUM 7.6* 7.7* 8.3*  MG 1.9 2.2  --   PHOS 1.6* 1.8* 3.5   Liver Function Tests  Recent Labs  06/13/13 0500 06/14/13 0430  AST 289* 145*  ALT 811* 541*  ALKPHOS 151* 239*  BILITOT 0.9 0.9  PROT 5.3* 5.8*  ALBUMIN 2.4* 2.5*   No results found for this basename: LIPASE, AMYLASE,  in the last 72 hours Cardiac Enzymes  Recent Labs  06/12/13 0427 06/13/13 0500  TROPONINI  19.79* 10.60*   Hemoglobin A1C  Recent Labs  06/12/13 0427  HGBA1C 6.0*    Radiology Studies Imaging results have been reviewed  TELE NSR    ASSESSMENT AND PLAN 1. Cardiogenic shock - resolved 2. Shock liver, improved 3. Low grade fever - possible aspiration? Atelectasis? 4. S/P anterior MI - plan coronary angio in AM, suspect LAD territory cannot be revascularized, but needs diagnostic cath, tentatively schedule for AM.   Sanda Klein, MD, [facility] [phone] office [phone] pager 06/14/2013 8:37 AM

## 2013-06-14 NOTE — Progress Notes (Deleted)
Inc peep to 8 per MD

## 2013-06-14 NOTE — Progress Notes (Signed)
ANTICOAGULATION CONSULT NOTE - Follow Up Consult  Pharmacy Consult for Heparin  Indication: chest pain/ACS, cardiac arrest  No Known Allergies  Patient Measurements: Height: 5\' 9"  (175.3 cm) Weight: 192 lb 14.4 oz (87.5 kg) IBW/kg (Calculated) : 70.7 Heparin Dosing Weight: 89 kg  Vital Signs: Temp: 99.5 F (37.5 C) (06/02 0600) Temp src: Rectal (06/02 0400) BP: 113/64 mmHg (06/02 0700) Pulse Rate: 92 (06/02 0700)  Labs:  Recent Labs  06/12/13 0427 06/12/13 1243 06/13/13 0500 06/14/13 0430  HGB 10.0*  --  10.0* 10.1*  HCT 29.2*  --  30.3* 30.5*  PLT 248  --  251 287  LABPROT  --   --  14.6  --   INR  --   --  1.16  --   HEPARINUNFRC 0.18* 0.30 0.26* 0.30  CREATININE 1.21  --  1.07 1.11  TROPONINI 19.79*  --  10.60*  --     Estimated Creatinine Clearance: 77.5 ml/min (by C-G formula based on Cr of 1.11).  Medical History: Past Medical History  Diagnosis Date  . Acute MI    Assessment: 58 y/o M unwitnessed cardiac arrest with ROSC after ACLS on heparin per pharmacy.  Heparin level therapeutic, but at low end  Goal of Therapy:  Heparin level 0.3-0.7 units/ml Monitor platelets by anticoagulation protocol: Yes   Plan:  -Increase heparin drip slightly to 1600 units/hr to maintain in goal range -Daily CBC/HL -Monitor for bleeding  Thank you. Anette Guarneri, PharmD [phone] 06/14/2013 8:32 AM

## 2013-06-14 NOTE — Progress Notes (Signed)
RN assessed swallowing at the bedside with water via cup & straw and applesauce, per MD request.  Patient tolerated well and was able to take PO meds.  Will advance diet and continue to monitor. [name]

## 2013-06-14 NOTE — Progress Notes (Addendum)
Patient has not voided 6 hours after foley removal, bladder scan volume revealed greater than 426 ml.  Bloody drainage noted around urethral meatus, pharmacist notified in regards to his heparin gtt; drip decreased.  I&O cath yielded 450 ml.  Will continue to monitor.  [place]

## 2013-06-14 NOTE — Progress Notes (Signed)
ANTICOAGULATION CONSULT NOTE - Follow Up Consult  Pharmacy Consult for Heparin  Indication: chest pain/ACS, cardiac arrest  No Known Allergies  Patient Measurements: Height: 5\' 9"  (175.3 cm) Weight: 192 lb 14.4 oz (87.5 kg) IBW/kg (Calculated) : 70.7 Heparin Dosing Weight: 89 kg  Vital Signs: Temp: 98.1 F (36.7 C) (06/02 1628) Temp src: Oral (06/02 1628) BP: 130/65 mmHg (06/02 1700) Pulse Rate: 94 (06/02 1700)  Labs:  Recent Labs  06/12/13 0427 06/12/13 1243 06/13/13 0500 06/14/13 0430  HGB 10.0*  --  10.0* 10.1*  HCT 29.2*  --  30.3* 30.5*  PLT 248  --  251 287  LABPROT  --   --  14.6  --   INR  --   --  1.16  --   HEPARINUNFRC 0.18* 0.30 0.26* 0.30  CREATININE 1.21  --  1.07 1.11  TROPONINI 19.79*  --  10.60*  --     Estimated Creatinine Clearance: 77.5 ml/min (by C-G formula based on Cr of 1.11).  Medical History: Past Medical History  Diagnosis Date  . Acute MI    Assessment: 58 y/o M unwitnessed cardiac arrest with ROSC after ACLS on heparin per pharmacy.  Heparin level therapeutic but at low end this AM. Heparin drip was increased to 1600 units/hr. RN called to report bloody drainage from penis after removal of urinary catheter.   Goal of Therapy:  Heparin level 0.3-0.7 units/ml Monitor platelets by anticoagulation protocol: Yes   Plan:  -Decrease heparin drip slightly to 1550 units/hr  -Daily CBC/HL -Monitor for bleeding. If bleeding does not resolve, consider holding heparin x 1 hour and resuming. F/u AM HL   Albertina Parr, PharmD.  Clinical Pharmacist Pager [phone]

## 2013-06-15 ENCOUNTER — Encounter (HOSPITAL_COMMUNITY)
Admission: EM | Disposition: A | Discharge status: Home Health | Payer: Self-pay | Source: Home / Self Care | Attending: Cardiovascular Disease

## 2013-06-15 HISTORY — PX: LEFT HEART CATHETERIZATION WITH CORONARY/GRAFT ANGIOGRAM: SHX5450

## 2013-06-15 LAB — CBC WITH DIFFERENTIAL/PLATELET
BASOS ABS: 0 10*3/uL (ref 0.0–0.1)
Basophils Relative: 0 % (ref 0–1)
EOS ABS: 0.2 10*3/uL (ref 0.0–0.7)
Eosinophils Relative: 2 % (ref 0–5)
HCT: 27.8 % — ABNORMAL LOW (ref 39.0–52.0)
Hemoglobin: 9.3 g/dL — ABNORMAL LOW (ref 13.0–17.0)
LYMPHS PCT: 22 % (ref 12–46)
Lymphs Abs: 2.2 10*3/uL (ref 0.7–4.0)
MCH: 31.8 pg (ref 26.0–34.0)
MCHC: 33.5 g/dL (ref 30.0–36.0)
MCV: 95.2 fL (ref 78.0–100.0)
Monocytes Absolute: 1.6 10*3/uL — ABNORMAL HIGH (ref 0.1–1.0)
Monocytes Relative: 16 % — ABNORMAL HIGH (ref 3–12)
Neutro Abs: 5.9 10*3/uL (ref 1.7–7.7)
Neutrophils Relative %: 60 % (ref 43–77)
PLATELETS: 276 10*3/uL (ref 150–400)
RBC: 2.92 MIL/uL — ABNORMAL LOW (ref 4.22–5.81)
RDW: 14.9 % (ref 11.5–15.5)
WBC: 10 10*3/uL (ref 4.0–10.5)

## 2013-06-15 LAB — BASIC METABOLIC PANEL
BUN: 13 mg/dL (ref 6–23)
CALCIUM: 8.2 mg/dL — AB (ref 8.4–10.5)
CO2: 27 meq/L (ref 19–32)
Chloride: 101 mEq/L (ref 96–112)
Creatinine, Ser: 1.06 mg/dL (ref 0.50–1.35)
GFR calc non Af Amer: 74 mL/min — ABNORMAL LOW (ref >90)
GFR, EST AFRICAN AMERICAN: 86 mL/min — AB (ref >90)
Glucose, Bld: 93 mg/dL (ref 70–99)
Potassium: 4.3 mEq/L (ref 3.7–5.3)
SODIUM: 136 meq/L — AB (ref 137–147)

## 2013-06-15 LAB — PHOSPHORUS: PHOSPHORUS: 2.7 mg/dL (ref 2.3–4.6)

## 2013-06-15 LAB — HEPARIN LEVEL (UNFRACTIONATED): Heparin Unfractionated: 0.31 IU/mL (ref 0.30–0.70)

## 2013-06-15 SURGERY — LEFT HEART CATHETERIZATION WITH CORONARY/GRAFT ANGIOGRAM
Anesthesia: LOCAL

## 2013-06-15 MED ORDER — ENOXAPARIN SODIUM 40 MG/0.4ML ~~LOC~~ SOLN
40.0000 mg | SUBCUTANEOUS | Status: DC
  Administered 2013-06-16 – 2013-06-20 (×5): 40 mg via SUBCUTANEOUS
  Filled 2013-06-15 (×7): qty 0.4

## 2013-06-15 MED ORDER — FENTANYL CITRATE 0.05 MG/ML IJ SOLN
INTRAMUSCULAR | Status: AC
  Filled 2013-06-15: qty 2

## 2013-06-15 MED ORDER — LIDOCAINE HCL (PF) 1 % IJ SOLN
INTRAMUSCULAR | Status: AC
  Filled 2013-06-15: qty 30

## 2013-06-15 MED ORDER — MIDAZOLAM HCL 2 MG/2ML IJ SOLN
INTRAMUSCULAR | Status: AC
  Filled 2013-06-15: qty 2

## 2013-06-15 MED ORDER — SODIUM CHLORIDE 0.9 % IV SOLN
INTRAVENOUS | Status: AC
  Administered 2013-06-15: 12:00:00 via INTRAVENOUS

## 2013-06-15 MED ORDER — VERAPAMIL HCL 2.5 MG/ML IV SOLN
INTRAVENOUS | Status: AC
  Filled 2013-06-15: qty 2

## 2013-06-15 MED ORDER — NITROGLYCERIN 0.2 MG/ML ON CALL CATH LAB
INTRAVENOUS | Status: AC
  Filled 2013-06-15: qty 1

## 2013-06-15 MED ORDER — HEPARIN (PORCINE) IN NACL 2-0.9 UNIT/ML-% IJ SOLN
INTRAMUSCULAR | Status: AC
  Filled 2013-06-15: qty 1500

## 2013-06-15 MED ORDER — OXYCODONE-ACETAMINOPHEN 5-325 MG PO TABS
1.0000 | ORAL_TABLET | ORAL | Status: DC | PRN
  Administered 2013-06-16 (×2): 2 via ORAL
  Administered 2013-06-16: 1 via ORAL
  Administered 2013-06-17: 2 via ORAL
  Administered 2013-06-17: 1 via ORAL
  Administered 2013-06-18: 2 via ORAL
  Administered 2013-06-18: 1 via ORAL
  Administered 2013-06-18 – 2013-06-20 (×8): 2 via ORAL
  Filled 2013-06-15 (×7): qty 2
  Filled 2013-06-15: qty 1
  Filled 2013-06-15 (×2): qty 2
  Filled 2013-06-15: qty 1
  Filled 2013-06-15 (×3): qty 2

## 2013-06-15 MED ORDER — HEPARIN SODIUM (PORCINE) 1000 UNIT/ML IJ SOLN
INTRAMUSCULAR | Status: AC
  Filled 2013-06-15: qty 1

## 2013-06-15 NOTE — H&P (View-Only) (Signed)
Patient Name: [name] Date of Encounter: 06/15/2013  Active Problems:   Cardiac arrest   Acute respiratory failure with hypoxia   Cardiogenic shock   Cardiogenic pulmonary edema   Length of Stay: 6  SUBJECTIVE  More alert, still occasionally a little disoriented. Emotional, short term memory loss. Has bruises on medial surface of both forearms.  Mr. [name] is a 58 y.o. male with PMH CAD, s/p 1v CABG (SVG to LAD in 2006), recent hospital admission at [facility] on 5/20 with NSTEMI and occlusion of SVG to LAD with stent placed 5/21, bilateral carotid stenosis, L subclavian stenosis, renal artery stenosis, ongoing tobacco use, who presents after unwitnessed cardiac arrest at home requring ~15 minutes of CPR and Epi x3 for ROSC with subsequent codes x2 in ED again. Intubated and sedated on arrival. Hypothermia protocol was initially started but subsequently stopped. Hypotensive requiring pressors. He has been febrile and was started on Unasyn for ?aspiration PNA.    CURRENT MEDS . antiseptic oral rinse  15 mL Mouth Rinse BID  . aspirin  81 mg Per Tube Daily  . aspirin  81 mg Oral Pre-Cath  . chlorhexidine  15 mL Mouth/Throat BID  . pantoprazole (PROTONIX) IV  40 mg Intravenous QHS  . sodium chloride  3 mL Intravenous Q12H  . thiamine  100 mg Oral Daily   Or  . thiamine IV  100 mg Intravenous Daily  . ticagrelor  90 mg Oral BID    OBJECTIVE   Intake/Output Summary (Last 24 hours) at 06/15/13 0806 Last data filed at 06/15/13 0700  Gross per 24 hour  Intake    821 ml  Output   1925 ml  Net  -1104 ml   Filed Weights   06/13/13 0500 06/14/13 0418 06/15/13 0700  Weight: 193 lb 12.6 oz (87.9 kg) 192 lb 14.4 oz (87.5 kg) 193 lb 2 oz (87.6 kg)    PHYSICAL EXAM Filed Vitals:   06/15/13 0400 06/15/13 0500 06/15/13 0600 06/15/13 0700  BP: 95/53 111/64 117/61 111/35  Pulse: 85 88 91 85  Temp:    98 F (36.7 C)  TempSrc:    Oral  Resp: 10 10 13 12   Height:      Weight:     193 lb 2 oz (87.6 kg)  SpO2: 94% 94% 95% 96%   General: Sleepy, readily awakes to conversational tone, oriented x1, no distress  Head: no evidence of trauma, PERRL, EOMI, no exophtalmos or lid lag, no myxedema, no xanthelasma; normal ears, nose and oropharynx  Neck: normal jugular venous pulsations and no hepatojugular reflux; brisk carotid pulses without delay and no carotid bruits  Chest: clear to auscultation, no signs of consolidation by percussion or palpation, normal fremitus, symmetrical and full respiratory excursions  Cardiovascular: normal position and quality of the apical impulse, regular rhythm, normal first and second heart sounds, no rubs or gallops, no murmur  Abdomen: no tenderness or distention, no masses by palpation, no abnormal pulsatility or arterial bruits, normal bowel sounds, no hepatosplenomegaly  Extremities: no clubbing, cyanosis or edema; 2+ radial, ulnar and brachial pulses bilaterally; 2+ right femoral, posterior tibial and dorsalis pedis pulses; 2+ left femoral, posterior tibial and dorsalis pedis pulses; no subclavian or femoral bruits  Neurological: grossly nonfocal   LABS  CBC  Recent Labs  06/14/13 0430 06/15/13 0415  WBC 12.6* 10.0  NEUTROABS 9.5* 5.9  HGB 10.1* 9.3*  HCT 30.5* 27.8*  MCV 95.9 95.2  PLT 287 276   Basic  Metabolic Panel  Recent Labs  06/13/13 0500 06/14/13 0430 06/15/13 0415  NA 137 138 136*  K 3.3* 4.5 4.3  CL 100 103 101  CO2 27 26 27   GLUCOSE 122* 111* 93  BUN 10 10 13   CREATININE 1.07 1.11 1.06  CALCIUM 7.7* 8.3* 8.2*  MG 2.2  --   --   PHOS 1.8* 3.5 2.7   Liver Function Tests  Recent Labs  06/13/13 0500 06/14/13 0430  AST 289* 145*  ALT 811* 541*  ALKPHOS 151* 239*  BILITOT 0.9 0.9  PROT 5.3* 5.8*  ALBUMIN 2.4* 2.5*   No results found for this basename: LIPASE, AMYLASE,  in the last 72 hours Cardiac Enzymes  Recent Labs  06/13/13 0500  TROPONINI 10.60*    Radiology Studies Imaging results  have been reviewed and Dg Chest Port 1 View  06/14/2013   CLINICAL DATA:  Check endotracheal tube.  EXAM: PORTABLE CHEST - 1 VIEW  COMPARISON:  06/13/2013.  FINDINGS: Interval tracheal and esophageal extubation. A left subclavian central line ends in stable position.  Mild cardiomegaly shows no interval change.  Status post CABG.  Small left pleural effusion, some of which may be sub pulmonic based on the apparent morphology of the diaphragm. There is still interstitial coarsening and cephalized blood flow. Atelectasis around the right minor fissure.  IMPRESSION: Unchanged pulmonary edema and small left pleural effusion.   Electronically Signed   By: Jorje Guild M.D.   On: 06/14/2013 06:27    TELE NSR   ASSESSMENT AND PLAN 1. Cardiogenic shock - resolved  2. Shock liver, improved  3. Low grade fever - possible aspiration? Atelectasis?  4. S/P anterior MI - plan coronary angio in AM, suspect LAD territory cannot be revascularized, but needs diagnostic cath, tentatively schedule for AM.  5. Delirium - mild and improving, mostly symptoms ascribable to arrest, but may be some component of delayed withdrawal from ETOH and benzodiazepines (6-12 beers/day and xanax at home). 6. Bruising - etiology unclear, platelet count OK  Sanda Klein, MD, [facility] [phone] office [phone] pager 06/15/2013 8:06 AM

## 2013-06-15 NOTE — Progress Notes (Signed)
ANTICOAGULATION CONSULT NOTE - Follow Up Consult  Pharmacy Consult for Heparin  Indication: chest pain/ACS, cardiac arrest  No Known Allergies  Patient Measurements: Height: 5\' 9"  (175.3 cm) Weight: 193 lb 2 oz (87.6 kg) IBW/kg (Calculated) : 70.7 Heparin Dosing Weight: 89 kg  Vital Signs: Temp: 98 F (36.7 C) (06/03 0700) Temp src: Oral (06/03 0700) BP: 127/74 mmHg (06/03 0800) Pulse Rate: 96 (06/03 0800)  Labs:  Recent Labs  06/13/13 0500 06/14/13 0430 06/15/13 0415  HGB 10.0* 10.1* 9.3*  HCT 30.3* 30.5* 27.8*  PLT 251 287 276  LABPROT 14.6  --   --   INR 1.16  --   --   HEPARINUNFRC 0.26* 0.30 0.31  CREATININE 1.07 1.11 1.06  TROPONINI 10.60*  --   --     Estimated Creatinine Clearance: 81.2 ml/min (by C-G formula based on Cr of 1.06).  Medical History: Past Medical History  Diagnosis Date  . Acute MI    Assessment: 57 y/o M unwitnessed cardiac arrest with ROSC after ACLS on heparin per pharmacy.  Heparin level therapeutic but at low end this AM.  Cath planned for 6/4  Goal of Therapy:  Heparin level 0.3-0.7 units/ml Monitor platelets by anticoagulation protocol: Yes   Plan:  -Continue heparin at 1550 units / hr  -Daily CBC/HL  Thank you. Anette Guarneri, PharmD [phone]

## 2013-06-15 NOTE — CV Procedure (Signed)
Left Heart Catheterization with Coronary Angiography and Bypass  Report  EVART MCDONNELL  58 y.o.  male June 24, 1955  Procedure Date: 06/15/2013 Referring Physician: Sallyanne Kuster, MD Primary Cardiologist: Croitoru, MD  INDICATIONS: Recent out of hospital cardiac arrest, hypothermia protocol, and history of recently placed stents at [facility] following a myocardial infarction.  PROCEDURE: 1. Left heart catheterization; 2. Coronary angiography; 3. Left ventricle RV; 4. Bypass graft angiography  CONSENT:  The risks, benefits, and details of the procedure were explained in detail to the patient. Risks including death, stroke, heart attack, kidney injury, allergy, limb ischemia, bleeding and radiation injury were discussed.  The patient verbalized understanding and wanted to proceed.  Informed written consent was obtained.  PROCEDURE TECHNIQUE:  After Xylocaine anesthesia a 5 French Slender sheath was placed in the right radial artery with an angiocath and the modified Seldinger technique.  Coronary angiography was done using a 5 F JR 4 and JL 3.5 cm diagnostic catheter.  Left ventriculography was done using the JR 3.5 catheter and hand injection.   Hemostasis was achieved with a wrist band.   CONTRAST:  Total of 90 cc.  COMPLICATIONS:  None   HEMODYNAMICS:  Aortic pressure 121/59 mmHg; LV pressure 125/15 mmHg; LVEDP 25 mm mercury  ANGIOGRAPHIC DATA:   The left main coronary artery is heavily calcified, with somewhat hazy appearance, and 30-40% mid to distal narrowing..  The left anterior descending artery is totally occluded proximally. It is heavily calcified. The ostium of the LAD contains 70-80% narrowing with a threatened territory being a small to moderate size diagonal..  The left circumflex artery is is patent. A relatively long stent in the proximal circumflex is widely patent. The ostial circumflex and proximal segment contained no fixed obstruction and a  hazy appearance.  The right coronary artery is heavily calcified in the ostium. The vessel is otherwise smooth and widely patent. No obstruction is noted.Marland Kitchen  BYPASS GRAFT ANGIOGRAPHY: The saphenous vein graft to the LAD is widely patent as is the stent in the mid body of the graft. The LAD fills both anteriorly and retrogradely from the anastomosis with total occlusion of the LAD in the mid segment.  LEFT VENTRICULOGRAM:  Left ventricular angiogram was done in the 30 RAO projection and revealed basal to mid anterior wall moderate hypokinesis. EF 40%.   IMPRESSIONS:  1. Widely patent saphenous vein graft to the mid LAD. This vessel is stented and there is no evidence of ISR. 2. Widely patent proximal to mid circumflex stent. There is haziness in the circumflex proximal to the stent but no significant obstruction. 3. Mild to moderate distal left main stenosis in the 30-40% range. 4. 80-90% ostial LAD threatening a small to moderate diagonal territory in the distribution of the wall motion abnormality. The mid LAD off the retrograde segment from the graft insertion site is also occluded. 5. Widely patent RCA  RECOMMENDATION:  1. The patient is a fixed basal to mid anterior wall motion abnormality. This is in the distribution of proximal LAD and diagonal. This raises the question of whether or not the ostial LAD/diagonal could be a source of ischemia that cause arrhythmia. Intervention in this area would be difficult due to heavy calcification and will involve the left main which was then threatened the circumflex. If recurrent angina, the only area that would explain this symptom at this time would be the ostial LAD diagonal territory. At some point consideration of a myocardial perfusion study  to look for ischemia/problem myocardium in that region would be reasonable.  2. Despite the presentation with cardiac arrest in the setting of myocardial infarction, consideration of AICD implantation should be  addressed. Discussed with Dr. Sallyanne Kuster.

## 2013-06-15 NOTE — Progress Notes (Signed)
Patient Name: [name] Date of Encounter: 06/15/2013  Active Problems:   Cardiac arrest   Acute respiratory failure with hypoxia   Cardiogenic shock   Cardiogenic pulmonary edema   Length of Stay: 6  SUBJECTIVE  More alert, still occasionally a little disoriented. Emotional, short term memory loss. Has bruises on medial surface of both forearms.  [name] is a 58 y.o. male with PMH CAD, s/p 1v CABG (SVG to LAD in 2006), recent hospital admission at [facility] on 5/20 with NSTEMI and occlusion of SVG to LAD with stent placed 5/21, bilateral carotid stenosis, L subclavian stenosis, renal artery stenosis, ongoing tobacco use, who presents after unwitnessed cardiac arrest at home requring ~15 minutes of CPR and Epi x3 for ROSC with subsequent codes x2 in ED again. Intubated and sedated on arrival. Hypothermia protocol was initially started but subsequently stopped. Hypotensive requiring pressors. He has been febrile and was started on Unasyn for ?aspiration PNA.    CURRENT MEDS . antiseptic oral rinse  15 mL Mouth Rinse BID  . aspirin  81 mg Per Tube Daily  . aspirin  81 mg Oral Pre-Cath  . chlorhexidine  15 mL Mouth/Throat BID  . pantoprazole (PROTONIX) IV  40 mg Intravenous QHS  . sodium chloride  3 mL Intravenous Q12H  . thiamine  100 mg Oral Daily   Or  . thiamine IV  100 mg Intravenous Daily  . ticagrelor  90 mg Oral BID    OBJECTIVE   Intake/Output Summary (Last 24 hours) at 06/15/13 0806 Last data filed at 06/15/13 0700  Gross per 24 hour  Intake    821 ml  Output   1925 ml  Net  -1104 ml   Filed Weights   06/13/13 0500 06/14/13 0418 06/15/13 0700  Weight: 193 lb 12.6 oz (87.9 kg) 192 lb 14.4 oz (87.5 kg) 193 lb 2 oz (87.6 kg)    PHYSICAL EXAM Filed Vitals:   06/15/13 0400 06/15/13 0500 06/15/13 0600 06/15/13 0700  BP: 95/53 111/64 117/61 111/35  Pulse: 85 88 91 85  Temp:    98 F (36.7 C)  TempSrc:    Oral  Resp: 10 10 13 12   Height:      Weight:     193 lb 2 oz (87.6 kg)  SpO2: 94% 94% 95% 96%   General: Sleepy, readily awakes to conversational tone, oriented x1, no distress  Head: no evidence of trauma, PERRL, EOMI, no exophtalmos or lid lag, no myxedema, no xanthelasma; normal ears, nose and oropharynx  Neck: normal jugular venous pulsations and no hepatojugular reflux; brisk carotid pulses without delay and no carotid bruits  Chest: clear to auscultation, no signs of consolidation by percussion or palpation, normal fremitus, symmetrical and full respiratory excursions  Cardiovascular: normal position and quality of the apical impulse, regular rhythm, normal first and second heart sounds, no rubs or gallops, no murmur  Abdomen: no tenderness or distention, no masses by palpation, no abnormal pulsatility or arterial bruits, normal bowel sounds, no hepatosplenomegaly  Extremities: no clubbing, cyanosis or edema; 2+ radial, ulnar and brachial pulses bilaterally; 2+ right femoral, posterior tibial and dorsalis pedis pulses; 2+ left femoral, posterior tibial and dorsalis pedis pulses; no subclavian or femoral bruits  Neurological: grossly nonfocal   LABS  CBC  Recent Labs  06/14/13 0430 06/15/13 0415  WBC 12.6* 10.0  NEUTROABS 9.5* 5.9  HGB 10.1* 9.3*  HCT 30.5* 27.8*  MCV 95.9 95.2  PLT 287 276   Basic  Metabolic Panel  Recent Labs  06/13/13 0500 06/14/13 0430 06/15/13 0415  NA 137 138 136*  K 3.3* 4.5 4.3  CL 100 103 101  CO2 27 26 27   GLUCOSE 122* 111* 93  BUN 10 10 13   CREATININE 1.07 1.11 1.06  CALCIUM 7.7* 8.3* 8.2*  MG 2.2  --   --   PHOS 1.8* 3.5 2.7   Liver Function Tests  Recent Labs  06/13/13 0500 06/14/13 0430  AST 289* 145*  ALT 811* 541*  ALKPHOS 151* 239*  BILITOT 0.9 0.9  PROT 5.3* 5.8*  ALBUMIN 2.4* 2.5*   No results found for this basename: LIPASE, AMYLASE,  in the last 72 hours Cardiac Enzymes  Recent Labs  06/13/13 0500  TROPONINI 10.60*    Radiology Studies Imaging results  have been reviewed and Dg Chest Port 1 View  06/14/2013   CLINICAL DATA:  Check endotracheal tube.  EXAM: PORTABLE CHEST - 1 VIEW  COMPARISON:  06/13/2013.  FINDINGS: Interval tracheal and esophageal extubation. A left subclavian central line ends in stable position.  Mild cardiomegaly shows no interval change.  Status post CABG.  Small left pleural effusion, some of which may be sub pulmonic based on the apparent morphology of the diaphragm. There is still interstitial coarsening and cephalized blood flow. Atelectasis around the right minor fissure.  IMPRESSION: Unchanged pulmonary edema and small left pleural effusion.   Electronically Signed   By: Jorje Guild M.D.   On: 06/14/2013 06:27    TELE NSR   ASSESSMENT AND PLAN 1. Cardiogenic shock - resolved  2. Shock liver, improved  3. Low grade fever - possible aspiration? Atelectasis?  4. S/P anterior MI - plan coronary angio in AM, suspect LAD territory cannot be revascularized, but needs diagnostic cath, tentatively schedule for AM.  5. Delirium - mild and improving, mostly symptoms ascribable to arrest, but may be some component of delayed withdrawal from ETOH and benzodiazepines (6-12 beers/day and xanax at home). 6. Bruising - etiology unclear, platelet count OK  Sanda Klein, MD, [facility] [phone] office [phone] pager 06/15/2013 8:06 AM

## 2013-06-15 NOTE — Interval H&P Note (Signed)
Cath Lab Visit (complete for each Cath Lab visit)  Clinical Evaluation Leading to the Procedure:   ACS: yes  Non-ACS:    Anginal Classification: CCS III  Anti-ischemic medical therapy: Maximal Therapy (2 or more classes of medications)  Non-Invasive Test Results: No non-invasive testing performed  Prior CABG: Previous CABG      History and Physical Interval Note:  06/15/2013 10:27 AM  [name]  has presented today for surgery, with the diagnosis of cp  The various methods of treatment have been discussed with the patient and family. After consideration of risks, benefits and other options for treatment, the patient has consented to  Procedure(s): LEFT HEART CATHETERIZATION WITH CORONARY/GRAFT ANGIOGRAM (N/A) as a surgical intervention .  The patient's history has been reviewed, patient examined, no change in status, stable for surgery.  I have reviewed the patient's chart and labs.  Questions were answered to the patient's satisfaction.     Belva Crome III

## 2013-06-16 ENCOUNTER — Inpatient Hospital Stay (HOSPITAL_COMMUNITY): Payer: Medicaid Other

## 2013-06-16 ENCOUNTER — Encounter (HOSPITAL_COMMUNITY): Payer: Self-pay | Admitting: *Deleted

## 2013-06-16 DIAGNOSIS — I469 Cardiac arrest, cause unspecified: Secondary | ICD-10-CM

## 2013-06-16 DIAGNOSIS — I2510 Atherosclerotic heart disease of native coronary artery without angina pectoris: Secondary | ICD-10-CM

## 2013-06-16 LAB — CULTURE, BLOOD (ROUTINE X 2)
Culture: NO GROWTH
Culture: NO GROWTH

## 2013-06-16 MED ORDER — TECHNETIUM TC 99M SESTAMIBI GENERIC - CARDIOLITE
10.0000 | Freq: Once | INTRAVENOUS | Status: AC | PRN
  Administered 2013-06-16: 10 via INTRAVENOUS

## 2013-06-16 MED ORDER — SODIUM CHLORIDE 0.9 % IJ SOLN
80.0000 mg | INTRAVENOUS | Status: AC
  Administered 2013-06-16: 40 mg via INTRAVENOUS

## 2013-06-16 MED ORDER — TECHNETIUM TC 99M SESTAMIBI GENERIC - CARDIOLITE
30.0000 | Freq: Once | INTRAVENOUS | Status: AC | PRN
  Administered 2013-06-16: 30 via INTRAVENOUS

## 2013-06-16 MED ORDER — REGADENOSON 0.4 MG/5ML IV SOLN
0.4000 mg | Freq: Once | INTRAVENOUS | Status: AC
  Administered 2013-06-16: 0.4 mg via INTRAVENOUS

## 2013-06-16 MED ORDER — CARVEDILOL 3.125 MG PO TABS
3.1250 mg | ORAL_TABLET | Freq: Two times a day (BID) | ORAL | Status: DC
  Administered 2013-06-16 – 2013-06-20 (×9): 3.125 mg via ORAL
  Filled 2013-06-16 (×12): qty 1

## 2013-06-16 MED ORDER — REGADENOSON 0.4 MG/5ML IV SOLN
INTRAVENOUS | Status: AC
  Filled 2013-06-16: qty 5

## 2013-06-16 NOTE — Progress Notes (Signed)
OT Cancellation Note  Patient Details Name: [name] MRN: [id number] DOB: 06-Jul-1955   Cancelled Treatment:    Reason Eval/Treat Not Completed: Patient at procedure or test/ unavailable Pt at nuclear med. Will return later if possible. [place], [place]  [phone] 06/16/2013 06/16/2013, 11:59 AM

## 2013-06-16 NOTE — Progress Notes (Signed)
PT Cancellation Note  Patient Details Name: [name] MRN: [id number] DOB: 03/02/1955   Cancelled Treatment:    Reason Eval/Treat Not Completed: Patient at procedure or test/unavailable   [name] [name] 06/16/2013, 11:57 AM Elwyn Reach, [place]

## 2013-06-16 NOTE — Progress Notes (Signed)
Patient Name: [name] Date of Encounter: 06/16/2013  Active Problems:   Cardiac arrest   Acute respiratory failure with hypoxia   Cardiogenic shock   Cardiogenic pulmonary edema   Length of Stay: 7  SUBJECTIVE  Alert today. Discussed small ventral hernia at chest tube site from CABG.  Has bruises on medial surface of both forearms. Chest wall pain from CPR.  [name] is a 58 y.o. male with PMH CAD, s/p 1v CABG (SVG to LAD in 2006), recent hospital admission at [facility] on 5/20 with NSTEMI and occlusion of SVG to LAD with stent placed 5/21, bilateral carotid stenosis, L subclavian stenosis, renal artery stenosis, ongoing tobacco use, who presented after unwitnessed cardiac arrest at home requring ~15 minutes of CPR and Epi x3 for ROSC with subsequent codes x2 in ED again. Intubated and sedated on arrival. Hypothermia protocol was initially started but subsequently stopped. Hypotensive requiring pressors. He has been febrile and was started on Unasyn for ?aspiration PNA.   Cath done 06/15/13 by Dr. Tamala [name]:  BYPASS GRAFT ANGIOGRAPHY: The saphenous vein graft to the LAD is widely patent as is the stent in the mid body of the graft. The LAD fills both anteriorly and retrogradely from the anastomosis with total occlusion of the LAD in the mid segment.   LEFT VENTRICULOGRAM: Left ventricular angiogram was done in the 30 RAO projection and revealed basal to mid anterior wall moderate hypokinesis. EF 40%.   IMPRESSIONS: 1. Widely patent saphenous vein graft to the mid LAD. This vessel is stented and there is no evidence of ISR.  2. Widely patent proximal to mid circumflex stent. There is haziness in the circumflex proximal to the stent but no significant obstruction.  3. Mild to moderate distal left main stenosis in the 30-40% range.  4. 80-90% ostial LAD threatening a small to moderate diagonal territory in the distribution of the wall motion abnormality. The mid LAD off the retrograde  segment from the graft insertion site is also occluded.  5. Widely patent RCA   RECOMMENDATION: 1. The patient is a fixed basal to mid anterior wall motion abnormality. This is in the distribution of proximal LAD and diagonal. This raises the question of whether or not the ostial LAD/diagonal could be a source of ischemia that cause arrhythmia. Intervention in this area would be difficult due to heavy calcification and will involve the left main which was then threatened the circumflex. If recurrent angina, the only area that would explain this symptom at this time would be the ostial LAD diagonal territory. At some point consideration of a myocardial perfusion study to look for ischemia/problem myocardium in that region would be reasonable.   2. Despite the presentation with cardiac arrest in the setting of myocardial infarction, consideration of AICD implantation should be addressed.   CURRENT MEDS . antiseptic oral rinse  15 mL Mouth Rinse BID  . aspirin  81 mg Per Tube Daily  . chlorhexidine  15 mL Mouth/Throat BID  . enoxaparin (LOVENOX) injection  40 mg Subcutaneous Q24H  . pantoprazole (PROTONIX) IV  40 mg Intravenous QHS  . thiamine  100 mg Oral Daily   Or  . thiamine IV  100 mg Intravenous Daily  . ticagrelor  90 mg Oral BID    OBJECTIVE  No intake or output data in the 24 hours ending 06/16/13 0821 Filed Weights   06/14/13 0418 06/15/13 0700 06/16/13 0613  Weight: 192 lb 14.4 oz (87.5 kg) 193 lb 2 oz (  87.6 kg) 193 lb 9 oz (87.8 kg)    PHYSICAL EXAM Filed Vitals:   06/15/13 1400 06/15/13 1430 06/15/13 2059 06/16/13 0613  BP: 119/60 117/64 127/77 130/72  Pulse: 74 77 100 90  Temp:   97.9 F (36.6 C) 97.8 F (36.6 C)  TempSrc:   Oral Oral  Resp:   18 18  Height:      Weight:    193 lb 9 oz (87.8 kg)  SpO2:  92% 96% 96%   General: Sleepy, readily awakes to conversational tone, oriented x1, no distress  Head: no evidence of trauma, PERRL, EOMI, no exophtalmos or lid  lag, no myxedema, no xanthelasma; normal ears, nose and oropharynx  Neck: normal jugular venous pulsations and no hepatojugular reflux; brisk carotid pulses without delay and no carotid bruits  Chest: clear to auscultation, no signs of consolidation by percussion or palpation, normal fremitus, symmetrical and full respiratory excursions  Cardiovascular: normal position and quality of the apical impulse, regular rhythm, normal first and second heart sounds, no rubs or gallops, no murmur  Abdomen: small ventral hernia, no tenderness or distention, no masses by palpation, no abnormal pulsatility or arterial bruits, normal bowel sounds, no hepatosplenomegaly. Mildly obese Extremities: no clubbing, cyanosis or edema; 2+ radial, ulnar and brachial pulses bilaterally; 2+ right femoral, posterior tibial and dorsalis pedis pulses; 2+ left femoral, posterior tibial and dorsalis pedis pulses; no subclavian or femoral bruits  Neurological: grossly nonfocal   LABS  CBC  Recent Labs  06/14/13 0430 06/15/13 0415  WBC 12.6* 10.0  NEUTROABS 9.5* 5.9  HGB 10.1* 9.3*  HCT 30.5* 27.8*  MCV 95.9 95.2  PLT 287 485   Basic Metabolic Panel  Recent Labs  06/14/13 0430 06/15/13 0415  NA 138 136*  K 4.5 4.3  CL 103 101  CO2 26 27  GLUCOSE 111* 93  BUN 10 13  CREATININE 1.11 1.06  CALCIUM 8.3* 8.2*  PHOS 3.5 2.7   Liver Function Tests  Recent Labs  06/14/13 0430  AST 145*  ALT 541*  ALKPHOS 239*  BILITOT 0.9  PROT 5.8*  ALBUMIN 2.5*   TELE NSR, couplet noted. No NSVT   ASSESSMENT AND PLAN 1. Cardiogenic shock - resolved, will add low dose coreg 3.125 BID. EF 40% on cath, ECHO 10-15% 2. Shock liver, improved  3. Low grade fever -resolved,  possible aspiration? Atelectasis?  4. S/P anterior MI -per Dr. Tamala [name] will order NUC stress to further quantify possible ischemia in prox LAD territory. PCI to prox LAD would be high risk with entrance of stent into LM. After results of NUC stress,  needs further discussion with [name]/ interventional team.  5. Delirium - Improved. mostly symptoms ascribable to arrest, but may be some component of delayed withdrawal from ETOH and benzodiazepines (6-12 beers/day and xanax at home). 6. Bruising - etiology unclear, platelet count OK 7. Cardiac arrest - in review of Dr. Thompson Caul cath note, EP consult to discuss ICD.  8. Mild anemia - monitor  Candee Furbish, MD   06/16/2013 8:21 AM

## 2013-06-16 NOTE — Progress Notes (Signed)
Discussed Mr. Krol's presentation with Dr. Cristopher Peru. His cardiac arrest occurred in the setting of an acute coronary syndrome with extensive evidence of myocardial injury (Troponin I >20 on 3 consecutive assays), although the culprit vessel is unclear. EF is now 40% or higher. AICD is not indicated for secondary prevention and it is premature to make a recommendation for primary prevention ICD. Recommend LifeVest for 90 days post MI, then reassess EF. If LVEF still <35%, would meet MADIT-II criteria for AICD. Discussed with Dr. Marlou Porch.  Sanda Klein, MD, [facility] [phone] office [phone] pager

## 2013-06-16 NOTE — Evaluation (Signed)
Physical Therapy Evaluation Patient Details Name: [name] MRN: [id number] DOB: 01/11/1956 Today's Date: 06/16/2013   History of Present Illness  58 year old male with significant cardiac history.MI 2006, 01/2013, 05/2013 all at [facility]. Received stents each time. 5/28 was at home in [facility] when he collapsed in bathroom. Family called EMS right away. Arrived in 10 minutes to find PEA arrest. ROSC with 15 mins CPR and epix3. In ED coded 2 additional times. VDRF 5/28-6/2  Clinical Impression  Pt very pleasant but unaware of events leading to admission or since admission. Pt with decreased memory and problem solving but moving well considering preceding events. Pt will benefit from acute therapy to address deficits and maximize mobility and independence to decrease burden of care.     Follow Up Recommendations Home health PT;Supervision for mobility/OOB    Equipment Recommendations  Rolling walker with 5" wheels    Recommendations for Other Services       Precautions / Restrictions Precautions Precautions: Fall      Mobility  Bed Mobility Overal bed mobility: Needs Assistance Bed Mobility: Rolling;Sidelying to Sit Rolling: Min guard Sidelying to sit: Min assist       General bed mobility comments: cues for sequence and ease of transition  Transfers Overall transfer level: Needs assistance   Transfers: Sit to/from Stand Sit to Stand: Min guard         General transfer comment: cues for hand placement and safety  Ambulation/Gait Ambulation/Gait assistance: Supervision Ambulation Distance (Feet): 150 Feet Assistive device: Rolling walker (2 wheeled) Gait Pattern/deviations: Step-through pattern;Decreased stride length   Gait velocity interpretation: <1.8 ft/sec, indicative of risk for recurrent falls General Gait Details: slow controlled gait   Stairs            Wheelchair Mobility    Modified Rankin (Stroke Patients Only)       Balance Overall balance  assessment: No apparent balance deficits (not formally assessed)                                           Pertinent Vitals/Pain 7/10 bilateral lateral pectoral pain HR 96-104 sats 93-96% on RA but reported feeling slightly SOB and returned to IL end of session with RN aware    Home Living Family/patient expects to be discharged to:: Private residence Living Arrangements: Children Available Help at Discharge: Family;Available PRN/intermittently Type of Home: House Home Access: Stairs to enter   Entrance Stairs-Number of Steps: 3 Home Layout: One level Home Equipment: None Additional Comments: Pt has 10 children ages 3-34 with 5 of which that live with him ages 2-14. Pt has a babysitter at home 5a-6p for 60yo who could stay with pt at DC as well as all children who will be out of school for the summer in 2 weeks. 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[employer]    Prior Function Level of Independence: Independent               Journalist, newspaper        Extremity/Trunk Assessment   Upper Extremity Assessment: Defer to OT evaluation           Lower Extremity Assessment: Overall WFL for tasks assessed (5/5 all myotomes bil LE)      Cervical / Trunk Assessment: Normal  Communication   Communication: No difficulties  Cognition Arousal/Alertness: Awake/alert Behavior During Therapy: WFL for tasks  assessed/performed Overall Cognitive Status: Impaired/Different from baseline Area of Impairment: Orientation;Problem solving Orientation Level: Situation           Problem Solving: Slow processing      General Comments      Exercises        Assessment/Plan    PT Assessment Patient needs continued PT services  PT Diagnosis Difficulty walking   PT Problem List Decreased activity tolerance;Decreased mobility;Decreased knowledge of use of DME  PT Treatment Interventions Gait training;DME instruction;Stair training;Functional mobility  training;Therapeutic activities;Therapeutic exercise;Cognitive remediation;Patient/family education   PT Goals (Current goals can be found in the Care Plan section) Acute Rehab PT Goals Patient Stated Goal: return to work PT Goal Formulation: With patient/family Time For Goal Achievement: 06/30/13 Potential to Achieve Goals: Good    Frequency Min 3X/week   Barriers to discharge        Co-evaluation               End of Session Equipment Utilized During Treatment: Gait belt Activity Tolerance: Patient tolerated treatment well Patient left: in chair;with call bell/phone within reach;with chair alarm set;with family/visitor present Nurse Communication: Mobility status         Time: 3546-5681 PT Time Calculation (min): 28 min   Charges:   PT Evaluation $Initial PT Evaluation Tier I: 1 Procedure PT Treatments $Gait Training: 8-22 mins   PT G Codes:          Montrel Donahoe B Nailani Full 06/16/2013, 2:21 PM Elwyn Reach, [place]

## 2013-06-16 NOTE — Progress Notes (Signed)
Lexiscan completed.  Patient suddenly became nauseated and vomited.  Aminophylline, 40mg , given.  Symptoms resolved  Tarri Fuller [facility]

## 2013-06-17 DIAGNOSIS — I472 Ventricular tachycardia: Secondary | ICD-10-CM | POA: Diagnosis present

## 2013-06-17 DIAGNOSIS — I4729 Other ventricular tachycardia: Secondary | ICD-10-CM | POA: Diagnosis present

## 2013-06-17 DIAGNOSIS — D638 Anemia in other chronic diseases classified elsewhere: Secondary | ICD-10-CM | POA: Diagnosis present

## 2013-06-17 DIAGNOSIS — I2510 Atherosclerotic heart disease of native coronary artery without angina pectoris: Secondary | ICD-10-CM

## 2013-06-17 LAB — CBC
HCT: 33 % — ABNORMAL LOW (ref 39.0–52.0)
Hemoglobin: 11 g/dL — ABNORMAL LOW (ref 13.0–17.0)
MCH: 30.6 pg (ref 26.0–34.0)
MCHC: 33.3 g/dL (ref 30.0–36.0)
MCV: 91.9 fL (ref 78.0–100.0)
PLATELETS: 372 10*3/uL (ref 150–400)
RBC: 3.59 MIL/uL — ABNORMAL LOW (ref 4.22–5.81)
RDW: 14.4 % (ref 11.5–15.5)
WBC: 11.4 10*3/uL — ABNORMAL HIGH (ref 4.0–10.5)

## 2013-06-17 MED ORDER — PANTOPRAZOLE SODIUM 40 MG PO TBEC
40.0000 mg | DELAYED_RELEASE_TABLET | Freq: Every day | ORAL | Status: DC
  Administered 2013-06-17 – 2013-06-20 (×4): 40 mg via ORAL
  Filled 2013-06-17 (×4): qty 1

## 2013-06-17 MED ORDER — ONDANSETRON HCL 4 MG PO TABS
4.0000 mg | ORAL_TABLET | Freq: Three times a day (TID) | ORAL | Status: DC | PRN
  Administered 2013-06-17: 4 mg via ORAL
  Filled 2013-06-17: qty 1

## 2013-06-17 NOTE — Progress Notes (Signed)
CARDIAC REHAB PHASE I   PRE:  Rate/Rhythm: 64 SR  BP:  Supine: 140/70  Sitting: 100/60  Standing:    SaO2: 97 RA  MODE:  Ambulation: to recliner ft   POST:  Rate/Rhythm:   BP:  Supine:   Sitting:   Standing:    SaO2:  0930-1015 On arrival pt in bed. States that his chest is very sore and hurts with any movement. He c/o of nausea and when I set him up on side of bed he felt dizzy. Pt was very diaphoretic and stated that his nausea was worse sitting. BP lower with sitting verses laying. Had pt to get to recliner, he was able to take a few steps there. He c/o of feeling that he would fall in the floor if he tried to walk. Call light in reach and instructed him not to get up without assistance. We will follow pt as assist X 2 to try to ambulate.  Rodney Langton RN 06/17/2013 11:09 AM

## 2013-06-17 NOTE — Progress Notes (Signed)
Patient Profile: Mr. [name] is a 58 y.o. male with PMH CAD, s/p 1v CABG (SVG to LAD in 2006), recent hospital admission at [facility] on 5/20 with NSTEMI and occlusion of SVG to LAD with stent placed 5/21, bilateral carotid stenosis, L subclavian stenosis, renal artery stenosis, ongoing tobacco use, who presented after unwitnessed cardiac arrest at home requring ~15 minutes of CPR and Epi x3 for ROSC with subsequent codes x2 in ED again. Intubated and sedated on arrival. Hypothermia protocol was initially started but subsequently stopped. Hypotensive requiring pressors. He has been febrile and was started on Unasyn for ?aspiration PNA.   Subjective: Complains of feeling very tired and weak. He also notes occasional short episodes of dizziness, lightheadedness and diaphoresis. He has sharp, positional, chest pain, exacerbated by movement, which he believes is secondary to bruised/fractured ribs from recent CPR.   Objective: Vital signs in last 24 hours: Temp:  [97.9 F (36.6 C)-98.2 F (36.8 C)] 97.9 F (36.6 C) (06/05 0604) Pulse Rate:  [66-96] 66 (06/05 0604) Resp:  [19-20] 20 (06/05 0604) BP: (108-155)/(56-74) 136/58 mmHg (06/05 0604) SpO2:  [92 %-96 %] 92 % (06/05 0604) Weight:  [192 lb 3.9 oz (87.2 kg)] 192 lb 3.9 oz (87.2 kg) (06/05 0604) Last BM Date: 06/15/13  Intake/Output from previous day: 06/04 0701 - 06/05 0700 In: -  Out: 1600 [Urine:1600] Intake/Output this shift: Total I/O In: 10 [P.O.:10] Out: -   Medications Current Facility-Administered Medications  Medication Dose Route Frequency Provider Last Rate Last Dose  . albuterol (PROVENTIL) (2.5 MG/3ML) 0.083% nebulizer solution 2.5 mg  2.5 mg Nebulization Q4H PRN Doree Fudge, MD      . ALPRAZolam Duanne Moron) tablet 0.25 mg  0.25 mg Oral BID PRN Raylene Miyamoto, MD   0.25 mg at 06/16/13 1307  . antiseptic oral rinse (BIOTENE) solution 15 mL  15 mL Mouth Rinse BID Brand Males, MD   15 mL at 06/16/13 2148  .  aspirin chewable tablet 81 mg  81 mg Per Tube Daily Larey Dresser, MD   81 mg at 06/16/13 1257  . carvedilol (COREG) tablet 3.125 mg  3.125 mg Oral BID WC Candee Furbish, MD   3.125 mg at 06/16/13 1807  . chlorhexidine (PERIDEX) 0.12 % solution 15 mL  15 mL Mouth/Throat BID Brand Males, MD   15 mL at 06/15/13 2137  . enoxaparin (LOVENOX) injection 40 mg  40 mg Subcutaneous Q24H Belva Crome III, MD   40 mg at 06/16/13 1257  . fentaNYL (SUBLIMAZE) injection 100 mcg  100 mcg Intravenous Q15 min PRN Brand Males, MD   100 mcg at 06/12/13 2301  . fentaNYL (SUBLIMAZE) injection 100 mcg  100 mcg Intravenous Q2H PRN Brand Males, MD   100 mcg at 06/13/13 0142  . ondansetron (ZOFRAN) injection 4 mg  4 mg Intravenous Q8H PRN Melvia Heaps, MD   4 mg at 06/13/13 1441  . oxyCODONE-acetaminophen (PERCOCET/ROXICET) 5-325 MG per tablet 1-2 tablet  1-2 tablet Oral Q4H PRN Sinclair Grooms, MD   2 tablet at 06/17/13 [phone]  . pantoprazole (PROTONIX) injection 40 mg  40 mg Intravenous QHS Corey Harold, NP   40 mg at 06/16/13 2147  . thiamine (VITAMIN B-1) tablet 100 mg  100 mg Oral Daily Jerene Pitch, MD   100 mg at 06/16/13 1257   Or  . thiamine (B-1) injection 100 mg  100 mg Intravenous Daily Jerene Pitch, MD   100 mg at 06/11/13 0924  .  ticagrelor (BRILINTA) tablet 90 mg  90 mg Oral BID Jerene Pitch, MD   90 mg at 06/16/13 2147    PE: General appearance: alert, cooperative and no distress Lungs: clear to auscultation bilaterally Heart: regular rate and rhythm and 1/6 SM  Extremities: no LEE, upper extremities notable for large bruises Pulses: 2+ and symmetric Skin: warm and dry Neurologic: Grossly normal  Lab Results:   Recent Labs  06/15/13 0415  WBC 10.0  HGB 9.3*  HCT 27.8*  PLT 276   BMET  Recent Labs  06/15/13 0415  NA 136*  K 4.3  CL 101  CO2 27  GLUCOSE 93  BUN 13  CREATININE 1.06  CALCIUM 8.2*    Studies/Results:  NST 06/16/13 IMPRESSION: This is  interpreted as an intermediate risk Myoview study. The patient has known coronary artery disease and has a known occlusion of the left anterior descending artery. The graft to the LAD is widely patent. The scintigraphic images show findings that are consistent with a previous basilar/mid anterior myocardial infarction. There is no evidence of ischemia. The overall left ventricular systolic function is moderately to severely reduced with an ejection fraction of 33%. There is akinesis of the basilar/mid anterior wall.   Assessment/Plan   Active Problems:   Cardiac arrest   Acute respiratory failure with hypoxia   Cardiogenic shock   Cardiogenic pulmonary edema   Anemia   NSVT (nonsustained ventricular tachycardia)   1. Cardiogenic shock - resolved, now on low dose coreg 3.125 BID and appears to be tolerating well. Increasing today.  EF 40% on cath, ECHO 10-15%. Case discussed with Dr. Lovena Le.  His cardiac arrest occurred in the setting of an acute coronary syndrome with extensive evidence of myocardial injury (Troponin I >20 on 3 consecutive assays), although the culprit vessel is unclear. AICD is not indicated for secondary prevention and it is premature to make a recommendation for primary prevention ICD. Recommend LifeVest for 90 days post MI, then reassess EF. If LVEF still <35%, would meet MADIT-II criteria for AICD.  NUC stress showed fixed scar in prox LAD territory. No PCI indicated. (Reviewed cath note from Dr. Tamala Julian)  2. Shock liver, improved. No statins until fully resolved.   3. Low grade fever -resolved, possible aspiration? Atelectasis?   4. S/P anterior MI - NST yesterday was ordered to r/o ischemia in the distribution of the proximal LAD and diagonal. The study was negative for ischemia (see impression listed above). Continue medical therapy: ASA, Brilinta and BB. No statins due to abnormal liver function.    5. Delirium - Improved. mostly symptoms ascribable to arrest,  but may be some component of delayed withdrawal from ETOH and benzodiazepines (6-12 beers/day and xanax at home).   6. Bruising - etiology unclear, platelet count OK   7. Cardiac arrest - Per EP, no indication for an AICD at this time. Will plan for a lifevest x 3 months, then reassess EF. If still < 35%, then he will be a candidate for an AICD.    8. Mild anemia - Hgb has been trending downward. He feels very tired. Last CBC was 2 days ago and Hgb was 9.3 (was 13.6 1 week ago). He is currently on DAPT with ASA + Brilinta. His right femoral cath site is stable. He has not produced a BM since admission, so cannot r/o melena. Will recheck CBC to follow hgb and ensure levels are stable.   9. NSVT - Continues to have runs on telemetry and  appears symptomatic, noting intermittent episodes of dizziness, lightheadedness, mild dyspnea and diaphoresis. Episodes come and go. Telemetry currently shows NSR with HR in the 80s. He is on low dose Coreg. ? Increasing dose for better beta blockage or ? Adding amiodarone. Will order LifeVest today.     LOS: 8 days    Brittainy M. Ladoris Gene 06/17/2013 10:23 AM  Personally seen and examined. Agree with above. Challenging situation Home health PT when life vest ready Will hold off on adding amiodarone (recent liver injury) Will increase coreg to 6.25 BID Watch for hypotension Anemia - monitor.  He perseverated today over Xanax    Candee Furbish, MD

## 2013-06-17 NOTE — Evaluation (Signed)
Occupational Therapy Evaluation Patient Details Name: [name] MRN: [id number] DOB: 04-22-1955 Today's Date: 06/17/2013    History of Present Illness 58 year old male with significant cardiac history.MI 2006, 01/2013, 05/2013 all at [facility]. Received stents each time. 5/28 was at home in [facility] when he collapsed in bathroom. Family called EMS right away. Arrived in 10 minutes to find PEA arrest. ROSC with 15 mins CPR and epix3. In ED coded 2 additional times. VDRF 5/28-6/2   Clinical Impression   Pt admitted with above. He demonstrates the below listed deficits and will benefit from continued OT to maximize safety and independence with BADLs.  Pt presents with slow processing.  Eval was limited by pt complaint of dizziness with nausea, Rt. Sided chest pain and LEs feeling weak when up.  Pt returned to sitting BP128/56; HR 65; 02 sats 95%.  Nausea and CP subsided after pt returned to seated position (RN notified).  Pt noted with dizziness with head turns and transitional movements.  Visual tested and significant horizontal nystagmus noted with head turns.  Recommend vestibular assessment.   Will follow      Follow Up Recommendations  Home health OT;Supervision/Assistance - 24 hour    Equipment Recommendations  Tub/shower seat    Recommendations for Other Services       Precautions / Restrictions Precautions Precautions: Fall      Mobility Bed Mobility                  Transfers Overall transfer level: Needs assistance Equipment used: Rolling walker (2 wheeled) Transfers: Sit to/from Stand;Stand Pivot Transfers Sit to Stand: Min guard Stand pivot transfers: Min assist       General transfer comment: min A due to dizziness    Balance Overall balance assessment: Needs assistance Sitting-balance support: Feet supported Sitting balance-Leahy Scale: Good     Standing balance support: Bilateral upper extremity supported Standing balance-Leahy Scale: Poor Standing  balance comment: due to dizziness                            ADL Overall ADL's : Needs assistance/impaired Eating/Feeding: Independent;Sitting   Grooming: Wash/dry hands;Wash/dry face;Oral care;Brushing hair;Set up;Sitting   Upper Body Bathing: Set up;Sitting   Lower Body Bathing: Moderate assistance;Sit to/from stand Lower Body Bathing Details (indicate cue type and reason): pt c/o dizziness when leaning forward Upper Body Dressing : Set up;Sitting   Lower Body Dressing: Moderate assistance;Sit to/from stand Lower Body Dressing Details (indicate cue type and reason): pt with c/o dizziness with leaning forward Toilet Transfer: Minimal assistance;Transfer board;Comfort height toilet Toilet Transfer Details (indicate cue type and reason): limited by dizziness Toileting- Clothing Manipulation and Hygiene: Minimal assistance;Sit to/from stand       Functional mobility during ADLs: Minimal assistance;Rolling walker General ADL Comments: Pt with complaint of dizziness when he leans forward, with head movements, and with transitional movements.  BP128/56; HR 65; 02 sat 95%.  Activity limited by dizziness.  Pt instructed in gaze stabilization which he reports helps a bit, but then dizziness increased, with pt. complaint of Rt. chest pain, nausea and feeling like "legs are going to give way".  Pt returned to recliner and RN alerted.  As pt sat, CP improved to 0/10 and dizziness resolved.  Pt demonstrates significant horizontal nystagmus with head turns.         Vision Eye Alignment: Within Functional Limits   Ocular Range of Motion: Within Functional Limits Tracking/Visual  Pursuits: Able to track stimulus in all quads without difficulty         Additional Comments: Nystagmus noted with head turns   Perception     Praxis      Pertinent Vitals/Pain Pt complaint chest pain 6/10 Rt side when ambulating.  0/10 when seated.      Hand Dominance Right   Extremity/Trunk  Assessment Upper Extremity Assessment Upper Extremity Assessment: Overall WFL for tasks assessed   Lower Extremity Assessment Lower Extremity Assessment: Overall WFL for tasks assessed   Cervical / Trunk Assessment Cervical / Trunk Assessment: Normal   Communication Communication Communication: No difficulties   Cognition Arousal/Alertness: Awake/alert Behavior During Therapy: WFL for tasks assessed/performed Overall Cognitive Status: Impaired/Different from baseline Area of Impairment: Problem solving             Problem Solving: Slow processing     General Comments       Exercises       Shoulder Instructions      Home Living Family/patient expects to be discharged to:: Private residence Living Arrangements: Children Available Help at Discharge: Family;Available PRN/intermittently Type of Home: House Home Access: Stairs to enter Entrance Stairs-Number of Steps: 3   Home Layout: One level     Bathroom Shower/Tub: Tub/shower unit;Walk-in shower Shower/tub characteristics: Architectural technologist: Standard     Home Equipment: None   Additional Comments: Pt has 10 children ages 3-34 with 5 of which that live with him ages 39-14. Pt has a babysitter at home 5a-6p for 60yo who could stay with pt at [place] as well as all children who will be out of school for the summer in 2 weeks. 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[employer]      Prior Functioning/Environment Level of Independence: Independent        Comments: Works as a Equities trader at [employer]    OT Diagnosis: Generalized weakness;Other (comment) (possible vestibular disturbance)   OT Problem List: Decreased strength;Decreased activity tolerance;Impaired balance (sitting and/or standing);Decreased cognition;Decreased knowledge of use of DME or AE;Pain   OT Treatment/Interventions: Self-care/ADL training;Therapeutic exercise;DME and/or AE instruction;Therapeutic activities;Cognitive  remediation/compensation;Patient/family education;Balance training    OT Goals(Current goals can be found in the care plan section) Acute Rehab OT Goals Patient Stated Goal: To get better OT Goal Formulation: With patient Time For Goal Achievement: 07/01/13 Potential to Achieve Goals: Good ADL Goals Pt Will Perform Grooming: with modified independence;standing Pt Will Perform Lower Body Bathing: with modified independence;sit to/from stand Pt Will Perform Lower Body Dressing: with modified independence;sit to/from stand Pt Will Transfer to Toilet: with modified independence;ambulating;regular height toilet Pt Will Perform Toileting - Clothing Manipulation and hygiene: with modified independence;sit to/from stand Pt Will Perform Tub/Shower Transfer: Tub transfer;Shower transfer;with modified independence;ambulating;rolling walker;shower seat  OT Frequency: Min 2X/week   Barriers to D/C:            Co-evaluation              End of Session Equipment Utilized During Treatment: Surveyor, mining Communication: Mobility status;Other (comment) (dizziness, chest pain, vitals)  Activity Tolerance: Other (comment) (dizziness) Patient left: in chair;with call bell/phone within reach;with chair alarm set;with family/visitor present   Time: 0998-3382 OT Time Calculation (min): 22 min Charges:  OT General Charges $OT Visit: 1 Procedure OT Evaluation $Initial OT Evaluation Tier I: 1 Procedure OT Treatments $Therapeutic Activity: 8-22 mins G-Codes:    Evian Salguero M Danyell Shader 2013/07/02, 12:37 PM

## 2013-06-17 NOTE — Progress Notes (Signed)
Received EMS records of rhythm strip on arrival to patient's home on day of admission.  Initial rhythm was VF, converted to SR with shock.

## 2013-06-17 NOTE — Progress Notes (Signed)
Physical Therapy Treatment Patient Details Name: [name] MRN: [id number] DOB: [birth year] Today's Date: 06/17/2013    History of Present Illness 58 year old male with significant cardiac history.MI 2006, 01/2013, 05/2013 all at [facility]. Received stents each time. 5/28 was at home in [facility] when he collapsed in bathroom. Family called EMS right away. Arrived in 10 minutes to find PEA arrest. ROSC with 15 mins CPR and epix3. In ED coded 2 additional times. VDRF 5/28-6/2    PT Comments    Vestibular assessment initiated.  Pt appears to have a peripheral issue.  Further testing is needed to determine if it is BPPV vs other peripheral problem.  He continues to be appropriate for HHPT, but I would like for it to be a vestibular specialist who sees him at home if his symptoms persist at discharge.  He will need to attempt [facility] tomorrow with two person assist and log roll to r/o positional vertigo.  I would also like for him to start x 1 seated exercises and compensatory strategies during attempts at mobility and gait.   Follow Up Recommendations  Home health PT;Supervision for mobility/OOB (with a vestibular specialist)     Equipment Recommendations  Rolling walker with 5" wheels    Recommendations for Other Services   NA     Precautions / Restrictions Precautions Precautions: Fall    Mobility  Bed Mobility Overal bed mobility: Needs Assistance Bed Mobility: Sidelying to Sit;Sit to Sidelying   Sidelying to sit: Mod assist     Sit to sidelying: Mod assist General bed mobility comments: Mod assit to support trunk, only because I was asking the patient to hug a pillow for comfort while preforming the modified dix hallpike.   Transfers Overall transfer level: Needs assistance Equipment used: None Transfers: Sit to/from [facility] Sit to Stand: Min assist Stand pivot transfers: Min assist       General transfer comment: Min assist due to dizziness to support  trunk for balance.          Balance Overall balance assessment: Needs assistance Sitting-balance support: Feet supported Sitting balance-Leahy Scale: Good     Standing balance support: Single extremity supported;Bilateral upper extremity supported Standing balance-Leahy Scale: Poor Standing balance comment: due to dizziness                    Cognition Arousal/Alertness: Awake/alert Behavior During Therapy: WFL for tasks assessed/performed Overall Cognitive Status: Impaired/Different from baseline Area of Impairment: Problem solving             Problem Solving: Slow processing         General Comments General comments (skin integrity, edema, etc.): Vestibular assessment initiated.  Pt reports room spinning with movement.  He reports he does not think he hit his head when he had this cardiac event in the bathroom.  He has not hearing loss, no ringing or fullness in the ear, no HA or neck pain, no fullness in his ears.  He does wear glasses for distance and wears them about all the time.  Oculomotor testing WNL, no signs of nystagmus or saccades.  Gaze stability testing elicited symptoms, but he could do it at a slow pace and keep focus on his target.  Preformed modified dix hallpike bil which elicited symptoms bil, but no acutal rotational nystagmus.  It was more painful to lay on his right side than his left side due to rib pain.  I would like to do a  true [facility], but would need a second person to make the maneuver more comfortable and he also needs to to the log roll test for horizontal canal BPPV.  Pt did get nauseated with testing, but was not sure if it was from dizziness or pain.  We used a pillow to brace his ribs during transitions.       Pertinent Vitals/Pain See vitals flow sheet.     Home Living Family/patient expects to be discharged to:: Private residence Living Arrangements: Children Available Help at Discharge: Family;Available  PRN/intermittently Type of Home: House Home Access: Stairs to enter   Home Layout: One level Home Equipment: None Additional Comments: Pt has 10 children ages 3-34 with 5 of which that live with him ages 40-14. Pt has a babysitter at home 5a-6p for 60yo who could stay with pt at [place] as well as all children who will be out of school for the summer in 2 weeks. 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[employer]    Prior Function Level of Independence: Independent      Comments: Works as a Equities trader at [employer] (current goals can now be found in the care plan section) Acute Rehab PT Goals Patient Stated Goal: To get better Progress towards PT goals: Not progressing toward goals - comment (limited by vertigo today)    Frequency  Min 3X/week    PT Plan Current plan remains appropriate       End of Session   Activity Tolerance: Patient limited by pain;Other (comment) (limited by dizziness) Patient left: in chair;with call bell/phone within reach;with family/visitor present     Time: 0100-7121 PT Time Calculation (min): 24 min  Charges:  $Therapeutic Activity: 23-37 mins            Kindell Strada B. Pettit, [place], DPT [phone]   06/17/2013, 3:54 PM

## 2013-06-18 MED ORDER — SODIUM CHLORIDE 0.9 % IJ SOLN
10.0000 mL | Freq: Two times a day (BID) | INTRAMUSCULAR | Status: DC
  Administered 2013-06-18: 10 mL

## 2013-06-18 MED ORDER — SODIUM CHLORIDE 0.9 % IJ SOLN
10.0000 mL | INTRAMUSCULAR | Status: DC | PRN
  Administered 2013-06-18 – 2013-06-19 (×3): 30 mL

## 2013-06-18 MED ORDER — OXYCODONE HCL 5 MG PO TABS
10.0000 mg | ORAL_TABLET | Freq: Once | ORAL | Status: AC
  Administered 2013-06-18: 10 mg via ORAL
  Filled 2013-06-18: qty 2

## 2013-06-18 NOTE — Progress Notes (Signed)
CARDIAC REHAB PHASE I   Came to ambulate pt however noted pt has continued to be dizzy. PT scheduled to do vestibular PT this pm. Will hold ambulation for now. Will f/u Monday.  [place], ACSM 06/18/2013 12:55 PM

## 2013-06-18 NOTE — Progress Notes (Signed)
       Patient Name: [name] Date of Encounter: 06/18/2013    SUBJECTIVE:He is up and conversant. Chest is still sore from CPR. He denies dyspnea.  TELEMETRY:  NSR with no ventricular ectopy. Filed Vitals:   06/17/13 0604 06/17/13 1328 06/17/13 2100 06/18/13 0500  BP: 136/58 127/63 127/65 136/105  Pulse: 66 58 80 85  Temp: 97.9 F (36.6 C) 97.5 F (36.4 C) 98.3 F (36.8 C) 98.2 F (36.8 C)  TempSrc: Oral Oral    Resp: 20 18 20 18   Height:      Weight: 192 lb 3.9 oz (87.2 kg)   191 lb 6.4 oz (86.818 kg)  SpO2: 92% 95% 97% 96%    Intake/Output Summary (Last 24 hours) at 06/18/13 1044 Last data filed at 06/18/13 1505  Gross per 24 hour  Intake      0 ml  Output   1025 ml  Net  -1025 ml   LABS: Basic Metabolic Panel: No results found for this basename: NA, K, CL, CO2, GLUCOSE, BUN, CREATININE, CALCIUM, MG, PHOS,  in the last 72 hours CBC:  Recent Labs  06/17/13 1056  WBC 11.4*  HGB 11.0*  HCT 33.0*  MCV 91.9  PLT 372     Radiology/Studies:  Nuclear revealed no ischemia, EF 33%.  Physical Exam: Blood pressure 136/105, pulse 85, temperature 98.2 F (36.8 C), temperature source Oral, resp. rate 18, height 5\' 9"  (1.753 m), weight 191 lb 6.4 oz (86.818 kg), SpO2 96.00%. Weight change: -13.5 oz (-0.382 kg)  Wt Readings from Last 3 Encounters:  06/18/13 191 lb 6.4 oz (86.818 kg)  06/18/13 191 lb 6.4 oz (86.818 kg)    S4 gallop and 2/6 systolic murmur of MR  ASSESSMENT:  1. VF cardiac arrest, successfully resuscitated 2. Acute NSTEMI, anterior 3. Acute systolic heart failure improving with LVEF 33-40% depending upon study. 4. Physical deconditioning 5. Still with cognitive impairment  Plan:  Plan slow progression over the weekend.  Needs PT/OT but apparent financial issues.   Signed, Belva Crome III 06/18/2013, 10:44 AM

## 2013-06-18 NOTE — Progress Notes (Signed)
Physical Therapy Treatment Patient Details Name: [name] MRN: [id number] DOB: [birth year] Today's Date: 06/18/2013    History of Present Illness 58 year old male with significant cardiac history.MI 2006, 01/2013, 05/2013 all at [facility]. Received stents each time. 5/28 was at home in [facility] when he collapsed in bathroom. Family called EMS right away. Arrived in 10 minutes to find PEA arrest. ROSC with 15 mins CPR and epix3. In ED coded 2 additional times. VDRF 5/28-6/2    PT Comments    Continued with vestibular evaluation.  Patient with negative Marye Round and Supine Roll test to both sides - does not seem to be BPPV.  Patient reports that Lt ear is popping - ? Fluid/infection - RN notified and will forward to MD for tomorrow.  Patient also commented that dizziness occurs following pain meds.  Describes dizziness as "lightheadedness" and "off balance".  Denies spinning today.  Was able to ambulate today with dizziness at lower level (5/10).  Will continue evaluation at next session.  Follow Up Recommendations  Supervision for mobility/OOB;Outpatient PT (OP PT (per chart, ins not covering HHPT))     Equipment Recommendations  Rolling walker with 5" wheels    Recommendations for Other Services       Precautions / Restrictions Precautions Precautions: Fall Restrictions Weight Bearing Restrictions: No    Mobility  Bed Mobility Overal bed mobility: Needs Assistance Bed Mobility: Rolling;Supine to Sit;Sit to Supine Rolling: Supervision   Supine to sit: Min assist;HOB elevated Sit to supine: Min assist;HOB elevated   General bed mobility comments: Verbal cues for technique and for testing maneuvers.  Assist to support trunk during transitions.  Transfers Overall transfer level: Needs assistance Equipment used: Rolling walker (2 wheeled) Transfers: Sit to/from Stand Sit to Stand: Min guard         General transfer comment: Verbal cues for hand placement.  Patient required  only min guard assist for safety/balance.  Ambulation/Gait Ambulation/Gait assistance: Min guard Ambulation Distance (Feet): 60 Feet Assistive device: Rolling walker (2 wheeled) Gait Pattern/deviations: Step-through pattern;Decreased step length - right;Decreased step length - left;Decreased stride length Gait velocity: Decreased Gait velocity interpretation: Below normal speed for age/gender General Gait Details: Slow, guarded gait.  Min guard assist for safety for balance/dizziness.   Stairs            Wheelchair Mobility    Modified Rankin (Stroke Patients Only)       Balance                                    Cognition Arousal/Alertness: Awake/alert Behavior During Therapy: WFL for tasks assessed/performed Overall Cognitive Status: Within Functional Limits for tasks assessed                      Exercises Other Exercises Other Exercises: Patient describes dizziness as lightheadedness and feeling off balance.  Denies spinning sensation.  Of note:  Patient reports Lt ear is "popping" (? fluid), and that his dizziness occurs after taking pain meds. Other Exercises: Marye Round:  Performed modified using bed tilted.  No symptoms to either side. Other Exercises: Supine roll test - Negative to both sides.    General Comments        Pertinent Vitals/Pain Pain in ribs 7/10 impacting mobility.    Home Living  Prior Function            PT Goals (current goals can now be found in the care plan section) Progress towards PT goals: Progressing toward goals    Frequency  Min 3X/week    PT Plan Discharge plan needs to be updated    Co-evaluation             End of Session Equipment Utilized During Treatment: Gait belt Activity Tolerance: Patient limited by fatigue;Patient limited by pain Patient left: in bed;with call bell/phone within reach (sitting EOB)     Time: 4196-2229 PT Time Calculation  (min): 33 min  Charges:  $Therapeutic Activity: 23-37 mins                    G Codes:      Despina Pole Jul 09, 2013, 8:38 PM Carita Pian. Sanjuana Kava, [place] Pager [phone]

## 2013-06-19 DIAGNOSIS — I472 Ventricular tachycardia, unspecified: Secondary | ICD-10-CM

## 2013-06-19 DIAGNOSIS — I469 Cardiac arrest, cause unspecified: Secondary | ICD-10-CM

## 2013-06-19 DIAGNOSIS — I2109 ST elevation (STEMI) myocardial infarction involving other coronary artery of anterior wall: Principal | ICD-10-CM

## 2013-06-19 DIAGNOSIS — I4729 Other ventricular tachycardia: Secondary | ICD-10-CM

## 2013-06-19 NOTE — Progress Notes (Signed)
Physical Therapy Treatment Patient Details Name: YUE GLASHEEN MRN: [id number] DOB: [birth year] Today's Date: 06/19/2013    History of Present Illness 58 year old male with significant cardiac history.MI 2006, 01/2013, 05/2013 all at [facility]. Received stents each time. 5/28 was at home in [facility] when he collapsed in bathroom. Family called EMS right away. Arrived in 10 minutes to find PEA arrest. ROSC with 15 mins CPR and epix3. In ED coded 2 additional times. VDRF 5/28-6/2    PT Comments    Patient reports dizziness is less this afternoon.  Reports dizziness increased after pain meds and now decreased again.  Describes dizziness as "lightheadedness".  Patient also reports Lt ear "popping" continues today.  Patient much improved with mobility and gait.  Follow Up Recommendations  Outpatient PT;Supervision for mobility/OOB     Equipment Recommendations  Rolling walker with 5" wheels    Recommendations for Other Services       Precautions / Restrictions Precautions Precautions: Fall Restrictions Weight Bearing Restrictions: No    Mobility  Bed Mobility Overal bed mobility: Needs Assistance Bed Mobility: Supine to Sit;Sit to Supine     Supine to sit: Min assist;HOB elevated Sit to supine: Supervision;HOB elevated   General bed mobility comments: Assist to raise trunk to sitting position.  Transfers Overall transfer level: Needs assistance Equipment used: Rolling walker (2 wheeled) Transfers: Sit to/from Stand Sit to Stand: Min guard         General transfer comment: Verbal cues for hand placement.  Patient required only min guard assist for safety/balance.  Ambulation/Gait Ambulation/Gait assistance: Supervision Ambulation Distance (Feet): 84 Feet Assistive device: Rolling walker (2 wheeled) Gait Pattern/deviations: Step-through pattern;Decreased stride length Gait velocity: Decreased Gait velocity interpretation: Below normal speed for age/gender General Gait Details:  Slow, guarded gait.  Min guard assist for safety for balance.   Stairs            Wheelchair Mobility    Modified Rankin (Stroke Patients Only)       Balance                                    Cognition Arousal/Alertness: Awake/alert Behavior During Therapy: WFL for tasks assessed/performed Overall Cognitive Status: Within Functional Limits for tasks assessed                      Exercises      General Comments        Pertinent Vitals/Pain Pain 7/10 in ribs - RN notified.    Home Living                      Prior Function            PT Goals (current goals can now be found in the care plan section) Progress towards PT goals: Progressing toward goals    Frequency  Min 3X/week    PT Plan Current plan remains appropriate    Co-evaluation             End of Session Equipment Utilized During Treatment: Gait belt Activity Tolerance: Patient limited by fatigue;Patient limited by pain Patient left: in bed;with call bell/phone within reach     Time: 1320-1340 PT Time Calculation (min): 20 min  Charges:  $Gait Training: 8-22 mins  G Codes:      Despina Pole 06/19/2013, 1:51 PM Carita Pian. Sanjuana Kava, [place] Pager [phone]

## 2013-06-19 NOTE — Progress Notes (Signed)
       Patient Name: [name] Date of Encounter: 06/19/2013    SUBJECTIVE:Has musculoskeletal chest pain. No dyspnea. Gets dizzy after pain meds.  TELEMETRY:  NSR Filed Vitals:   06/18/13 0500 06/18/13 1345 06/18/13 2100 06/19/13 0500  BP: 136/105 118/68 147/87 131/63  Pulse: 85 75 79 61  Temp: 98.2 F (36.8 C) 98.3 F (36.8 C) 98.6 F (37 C) 98.3 F (36.8 C)  TempSrc:  Oral    Resp: 18 20 18 16   Height:      Weight: 191 lb 6.4 oz (86.818 kg)   173 lb (78.472 kg)  SpO2: 96% 95% 95% 94%    Intake/Output Summary (Last 24 hours) at 06/19/13 0824 Last data filed at 06/18/13 1346  Gross per 24 hour  Intake    360 ml  Output    550 ml  Net   -190 ml   LABS: Basic Metabolic Panel: No results found for this basename: NA, K, CL, CO2, GLUCOSE, BUN, CREATININE, CALCIUM, MG, PHOS,  in the last 72 hours CBC:  Recent Labs  06/17/13 1056  WBC 11.4*  HGB 11.0*  HCT 33.0*  MCV 91.9  PLT 372   Radiology/Studies:  No new data  Physical Exam: Blood pressure 131/63, pulse 61, temperature 98.3 F (36.8 C), temperature source Oral, resp. rate 16, height 5\' 9"  (1.753 m), weight 173 lb (78.472 kg), SpO2 94.00%. Weight change: -18 lb 6.4 oz (-8.346 kg)  Wt Readings from Last 3 Encounters:  06/19/13 173 lb (78.472 kg)  06/19/13 173 lb (78.472 kg)    Tenderness to sternal palpation No rub or gallop No edema  ASSESSMENT:  1. S/P cardiac arrest with marked improvement and minimal residual neuro injury 2. Systolic dysfunction with EF around 35% 3. Mild cognitive impairment  Plan:  1. Probably home tomorrow with life vest  2. Needs OP PT  Signed, Belva Crome III 06/19/2013, 8:24 AM

## 2013-06-20 ENCOUNTER — Encounter (HOSPITAL_COMMUNITY): Payer: Self-pay | Admitting: Cardiology

## 2013-06-20 ENCOUNTER — Other Ambulatory Visit (HOSPITAL_COMMUNITY): Payer: Self-pay | Admitting: Cardiology

## 2013-06-20 DIAGNOSIS — I219 Acute myocardial infarction, unspecified: Secondary | ICD-10-CM | POA: Diagnosis present

## 2013-06-20 DIAGNOSIS — I255 Ischemic cardiomyopathy: Secondary | ICD-10-CM | POA: Diagnosis present

## 2013-06-20 DIAGNOSIS — K7200 Acute and subacute hepatic failure without coma: Secondary | ICD-10-CM

## 2013-06-20 DIAGNOSIS — Z789 Other specified health status: Secondary | ICD-10-CM

## 2013-06-20 DIAGNOSIS — R42 Dizziness and giddiness: Secondary | ICD-10-CM

## 2013-06-20 DIAGNOSIS — Z9189 Other specified personal risk factors, not elsewhere classified: Secondary | ICD-10-CM

## 2013-06-20 DIAGNOSIS — I501 Left ventricular failure: Secondary | ICD-10-CM

## 2013-06-20 DIAGNOSIS — R680 Hypothermia, not associated with low environmental temperature: Secondary | ICD-10-CM

## 2013-06-20 DIAGNOSIS — R531 Weakness: Secondary | ICD-10-CM

## 2013-06-20 DIAGNOSIS — R509 Fever, unspecified: Secondary | ICD-10-CM

## 2013-06-20 DIAGNOSIS — G9340 Encephalopathy, unspecified: Secondary | ICD-10-CM

## 2013-06-20 HISTORY — DX: Dizziness and giddiness: R42

## 2013-06-20 HISTORY — DX: Acute and subacute hepatic failure without coma: K72.00

## 2013-06-20 HISTORY — DX: Other specified personal risk factors, not elsewhere classified: Z91.89

## 2013-06-20 HISTORY — DX: Weakness: R53.1

## 2013-06-20 HISTORY — DX: Hypothermia, not associated with low environmental temperature: R68.0

## 2013-06-20 HISTORY — DX: Fever, unspecified: R50.9

## 2013-06-20 HISTORY — DX: Acute myocardial infarction, unspecified: I21.9

## 2013-06-20 HISTORY — DX: Encephalopathy, unspecified: G93.40

## 2013-06-20 LAB — HEPATIC FUNCTION PANEL
ALT: 108 U/L — AB (ref 0–53)
AST: 60 U/L — ABNORMAL HIGH (ref 0–37)
Albumin: 2.8 g/dL — ABNORMAL LOW (ref 3.5–5.2)
Alkaline Phosphatase: 225 U/L — ABNORMAL HIGH (ref 39–117)
BILIRUBIN DIRECT: 0.2 mg/dL (ref 0.0–0.3)
Indirect Bilirubin: 0.5 mg/dL (ref 0.3–0.9)
TOTAL PROTEIN: 6.3 g/dL (ref 6.0–8.3)
Total Bilirubin: 0.7 mg/dL (ref 0.3–1.2)

## 2013-06-20 LAB — BASIC METABOLIC PANEL
BUN: 13 mg/dL (ref 6–23)
CHLORIDE: 101 meq/L (ref 96–112)
CO2: 25 mEq/L (ref 19–32)
CREATININE: 1.04 mg/dL (ref 0.50–1.35)
Calcium: 8.7 mg/dL (ref 8.4–10.5)
GFR, EST AFRICAN AMERICAN: 88 mL/min — AB (ref >90)
GFR, EST NON AFRICAN AMERICAN: 76 mL/min — AB (ref >90)
Glucose, Bld: 123 mg/dL — ABNORMAL HIGH (ref 70–99)
Potassium: 3.9 mEq/L (ref 3.7–5.3)
Sodium: 138 mEq/L (ref 137–147)

## 2013-06-20 MED ORDER — TICAGRELOR 90 MG PO TABS: 90.0000 mg | ORAL_TABLET | Freq: Two times a day (BID) | ORAL | Status: DC

## 2013-06-20 MED ORDER — THIAMINE HCL 100 MG PO TABS: 100.0000 mg | ORAL_TABLET | Freq: Every day | ORAL | Status: DC

## 2013-06-20 MED ORDER — CARVEDILOL 3.125 MG PO TABS: 3.1250 mg | ORAL_TABLET | Freq: Two times a day (BID) | ORAL | Status: DC

## 2013-06-20 MED ORDER — LORATADINE 10 MG PO TABS
10.0000 mg | ORAL_TABLET | Freq: Every day | ORAL | Status: DC
  Filled 2013-06-20: qty 1

## 2013-06-20 MED ORDER — ENSURE COMPLETE PO LIQD: 237.0000 mL | Freq: Three times a day (TID) | ORAL | Status: DC

## 2013-06-20 MED ORDER — LORATADINE 10 MG PO TABS: 10.0000 mg | ORAL_TABLET | Freq: Every day | ORAL | Status: DC

## 2013-06-20 MED ORDER — PANTOPRAZOLE SODIUM 40 MG PO TBEC: 40.0000 mg | DELAYED_RELEASE_TABLET | Freq: Every day | ORAL | Status: DC

## 2013-06-20 MED ORDER — ENSURE COMPLETE PO LIQD: 237.0000 mL | Freq: Two times a day (BID) | ORAL | Status: DC

## 2013-06-20 MED ORDER — OXYCODONE-ACETAMINOPHEN 5-325 MG PO TABS: 1.0000 | ORAL_TABLET | ORAL | Status: DC | PRN

## 2013-06-20 NOTE — Discharge Summary (Signed)
Physician Discharge Summary       Patient ID: [name] MRN: [id number] DOB/AGE: 60-Jul-1957 60 y.o.  Admit date: 06/09/2013 Discharge date: 06/20/2013  Discharge Diagnoses:  Principal Problem:   Cardiac arrest, V fib on initial tracing shocked to SR this was followed by PEA, successfully resuscitated  Active Problems:   Acute respiratory failure with hypoxia, intubated on admit, self-extubatedn 06/13/13   Cardiogenic shock   Acute MI, troponin > 20, no obvious culprit vessel, NSTEMI anterior wall   Hypothermia, induced, post arrest, initially then stopped   Ischemic cardiomyopathy, EF 35%   Shock liver   Cardiogenic pulmonary edema, improved   Anemia   NSVT (nonsustained ventricular tachycardia)   Coronary atherosclerosis of native coronary artery, hx of CABG, stents to VG-LAD patent and stent to LCX patent 06/15/13   At risk for sudden cardiac death, has lifevest at discharge   Acute encephalopathy, improved   Dizziness   Weakness due to cardiac arrest   Fever, possible aspiration-treated with antibiotics   Discharged Condition: good  Primary Cardiologist:  Dr. Sallyanne Kuster  Procedures: Cardiac cath 06/15/13 by Dr. Tamala Julian   Please order on follow up: 2 D echo in first of SEPT.  eval EF for ICD. CMP, begin statin when LFTs normal, recheck Cr with addition of ACE at discharge.  Hospital Course: 58 y.o. male with CAD, s/p 1v CABG (SVG to LAD in 2006), bilateral carotid stenosis, L subclavian stenosis, renal artery stenosis, ongoing tobacco use, who presented to ER 06/09/13 after unwitnessed cardiac arrest at home. He was found down in his bathroom by 85 year old son, EMS was called, received at least 15 minutes of CPR and 3 rounds of Epi. V fib in the field. Unclear how long he was down before he was found by children and bystander CPR was started by son-in-law and done X 5 min.  ACLS X 15 min. On arrival he was intubated and sedated. He had subsequent PEA arrests in ER and was  resuscitated.  Hypothermia protocol was initially started but subsequently stopped. Hypotensive requiring levophed for cardiogenic shock.   Recent history per review of his [facility] Chart: Admitted 06/01/13 with NSTEMI, found to have occulsion of SVG to LAD. This was stented on 5/21. Post PCI angiogram with evidence of thrombus embolization to mid SVG, retrieved with filter. Widely patent LCx stent, and insignificant RCA disease. Of note SVG feeds D1 only as mid LAD is occluded past graft insertion.   EKG with 85m STE in v1, avR, and v4. Bedside echo performed by Dr. SKennith Centerwith globally depressed function, possibly worse in anterior/septal wall. Due to uncertainty of length of time down and his unstable condition he was not taken to cath lab.  He was placed on IV heparin and IV amiodarone.  He was sedated and paralyzed. He had hypocalcemia and replaced.  WBC was elevated due to arrest.  Plan was to proceed with cardiac cath once he recovered neurologically.    P2Y12 was done with high PRU level, concern for Plavix resistance, pt changed to Brilinta. [facility]   Levophed slowly weaned. Continued on vent. He was on Unasyn for possible aspiration PNA and fever.  He self extubated 06/13/13 and did well.  Pressors were weaned off completely by 06/14/13 resolving his cardiogenic shock.  His liver shock was also improved with drop in LFTs.   Pt was stable and cardiac cath scheduled for 06/15/13.  Pt did develop some  delirium unsure if from encephalopathy vs. etoh withdrawal.    Cardiac cath 06/15/13: The left main coronary artery is heavily calcified, with somewhat hazy appearance, and 30-40% mid to distal narrowing..  The left anterior descending artery is totally occluded proximally. It is heavily calcified. The ostium of the LAD contains 70-80% narrowing with a threatened territory being a small to moderate size diagonal..  The left circumflex artery is is patent. A  relatively long stent in the proximal circumflex is widely patent. The ostial circumflex and proximal segment contained no fixed obstruction and a hazy appearance.  The right coronary artery is heavily calcified in the ostium. The vessel is otherwise smooth and widely patent. No obstruction is noted.Marland Kitchen  BYPASS GRAFT ANGIOGRAPHY: The saphenous vein graft to the LAD is widely patent as is the stent in the mid body of the graft. The LAD fills both anteriorly and retrogradely from the anastomosis with total occlusion of the LAD in the mid segment.  LEFT VENTRICULOGRAM: Left ventricular angiogram was done in the 30 RAO projection and revealed basal to mid anterior wall moderate hypokinesis. EF 40%.  IMPRESSIONS: 1. Widely patent saphenous vein graft to the mid LAD. This vessel is stented and there is no evidence of ISR.  2. Widely patent proximal to mid circumflex stent. There is haziness in the circumflex proximal to the stent but no significant obstruction.  3. Mild to moderate distal left main stenosis in the 30-40% range.  4. 80-90% ostial LAD threatening a small to moderate diagonal territory in the distribution of the wall motion abnormality. The mid LAD off the retrograde segment from the graft insertion site is also occluded.  5. Widely patent RCA  RECOMMENDATION: 1. The patient is a fixed basal to mid anterior wall motion abnormality. This is in the distribution of proximal LAD and diagonal. This raises the question of whether or not the ostial LAD/diagonal could be a source of ischemia that cause arrhythmia. Intervention in this area would be difficult due to heavy calcification and will involve the left main which was then threatened the circumflex. If recurrent angina, the only area that would explain this symptom at this time would be the ostial LAD diagonal territory. At some point consideration of a myocardial perfusion study to look for ischemia/problem myocardium in that region would be  reasonable.  BB added to meds, but difficult to add ACE due to hypotension.  Pt underwent lexiscan myoview, no further procedures.  Pt also had dizziness felt to be allergy induced, claritin added at discharge.  Vestibular assessment completed by PT.  If symptoms persist then will need ENT eval. He will have outpt PT and is discharged with rolling walker.   1. Cardiogenic shock - resolved, now on low dose coreg 3.125 BID and appears to be tolerating well. Did not tolerate increase. EF 40% on cath, ECHO 10-15%. Case discussed with Dr. Lovena Le. His cardiac arrest occurred in the setting of an acute coronary syndrome with extensive evidence of myocardial injury (Troponin I >20 on 3 consecutive assays), although the culprit vessel is unclear. AICD is not indicated for secondary prevention and it is premature to make a recommendation for primary prevention ICD. Recommend LifeVest for 90 days post MI, then reassess EF. If LVEF still <35%, would meet MADIT-II criteria for AICD.  NUC stress showed fixed scar in prox LAD territory. No PCI indicated. (Reviewed cath note from Dr. Tamala Julian)  ACE added at discharge. 2. Shock liver, improved. No statins until fully resolved.  3. Low grade fever -resolved, possible aspiration-treated with Unasyn 4. S/P anterior MI - NST yesterday was ordered to r/o ischemia in the distribution of the proximal LAD and diagonal. The study was negative for ischemia (see impression listed above). Continue medical therapy: ASA, Brilinta and BB. No statins due to abnormal liver function. Pt was found to be resistant to Plavix. 5. Delirium - Improved. mostly symptoms ascribable to arrest, but may be some component of delayed withdrawal from ETOH and benzodiazepines (6-12 beers/day and xanax at home).  6. Bruising - etiology unclear, platelet count OK  7. Cardiac arrest - Per EP, no indication for an AICD at this time. Will plan for a lifevest x 3 months, then reassess EF. If still < 35%, then he  will be a candidate for an AICD.  8. Mild anemia - Hgb has been trending downward. He feels very tired. Last CBC was 2 days ago and Hgb was 9.3 (was 13.6 1 week ago) but up to 11 at discharge. He is currently on DAPT with ASA + Brilinta. His right femoral cath site is stable.  9. NSVT - Continues to have runs on telemetry and appears symptomatic, noting intermittent episodes of dizziness, lightheadedness, mild dyspnea and diaphoresis. Episodes come and go. Telemetry currently shows NSR with HR in the 80s. He is on low dose Coreg. Pt discharged with lifevest. 10. WEAKNESS due to cardiac arrest, for outpt PT    Pt was seen and evaluated by Dr. Tamala Julian and found stable for discharge.   Consults: cardiology and pulmonary/intensive care  Significant Diagnostic Studies:   BMET    Component Value Date/Time   NA 138 06/20/2013 1050   K 3.9 06/20/2013 1050   CL 101 06/20/2013 1050   CO2 25 06/20/2013 1050   GLUCOSE 123* 06/20/2013 1050   BUN 13 06/20/2013 1050   CREATININE 1.04 06/20/2013 1050   CALCIUM 8.7 06/20/2013 1050   GFRNONAA 76* 06/20/2013 1050   GFRAA 88* 06/20/2013 1050   Hepatic at discharge: Alk phos 225 down from 239 AST 60 down from 638 ALT 108 down from 1075  Troponin > 20 trending down to 10.6 prior to discharge  Magnesium 2.2  BNP (last 3 results)  Recent Labs  06/12/13 0427  PROBNP 2889.0*   CBC    Component Value Date/Time   WBC 11.4* 06/17/2013 1056   RBC 3.59* 06/17/2013 1056   HGB 11.0* 06/17/2013 1056   HCT 33.0* 06/17/2013 1056   PLT 372 06/17/2013 1056   MCV 91.9 06/17/2013 1056   MCH 30.6 06/17/2013 1056   MCHC 33.3 06/17/2013 1056   RDW 14.4 06/17/2013 1056   LYMPHSABS 2.2 06/15/2013 0415   MONOABS 1.6* 06/15/2013 0415   EOSABS 0.2 06/15/2013 0415   BASOSABS 0.0 06/15/2013 0415   HgB A1C 6.0 P2Y12 246   2 D Echo: Left ventricle: The cavity size was normal. Wall thickness was normal. Systolic function was normal. The estimated ejection fraction was in the range of 10% to 15%.  Severe hypokinesis of the entire myocardium. Features are consistent with a pseudonormal left ventricular filling pattern, with concomitant abnormal relaxation and increased filling pressure (grade 2 diastolic dysfunction). - Right ventricle: Systolic function was moderately reduced. - Right atrium: The atrium was mildly dilated. - Pulmonary arteries: Systolic pressure was mildly increased. PA peak pressure: 42 mm Hg (S).   Stress Myoview: IMPRESSION: This is interpreted as an intermediate risk Myoview study. The patient has known coronary artery disease and has a known  occlusion of the left anterior descending artery. The graft to the LAD is widely patent. The scintigraphic images show findings that are consistent with a previous basilar/mid anterior myocardial infarction. There is no evidence of ischemia. The overall left ventricular systolic function is moderately to severely reduced with an ejection fraction of 33%. There is akinesis of the basilar/mid anterior wall.   Multiple CXR done this is last CXR: PORTABLE CHEST - 1 VIEW COMPARISON: 06/13/2013. FINDINGS: Interval tracheal and esophageal extubation. A left subclavian central line ends in stable position. Mild cardiomegaly shows no interval change. Status post CABG. Small left pleural effusion, some of which may be sub pulmonic based on the apparent morphology of the diaphragm. There is still interstitial coarsening and cephalized blood flow. Atelectasis around the right minor fissure. IMPRESSION: Unchanged pulmonary edema and small left pleural effusion.    Discharge Exam: Blood pressure 152/69, pulse 79, temperature 98.4 F (36.9 C), temperature source Oral, resp. rate 18, height 5' 9"  (1.753 m), weight 173 lb (78.472 kg), SpO2 94.00%.    Disposition: Home     Medication List    STOP taking these medications       clopidogrel 75 MG tablet  Commonly known as:  PLAVIX     metoprolol 50 MG tablet    Commonly known as:  LOPRESSOR      TAKE these medications       ALPRAZolam 0.5 MG tablet  Commonly known as:  XANAX  Take 0.5 mg by mouth See admin instructions. Take 1 tablet by mouth twice daily, allow for additional tablet as needed     aspirin EC 81 MG tablet  Take 81 mg by mouth daily.     buPROPion 300 MG 24 hr tablet  Commonly known as:  WELLBUTRIN XL  Take 300 mg by mouth daily.     carvedilol 3.125 MG tablet  Commonly known as:  COREG  TAKE 1 TABLET BY MOUTH TWICE DAILY WITH MEALS     feeding supplement (ENSURE COMPLETE) Liqd  Take 237 mLs by mouth 3 (three) times daily between meals.     loratadine 10 MG tablet  Commonly known as:  CLARITIN  Take 1 tablet (10 mg total) by mouth daily.     multivitamin with minerals Tabs tablet  Take 1 tablet by mouth daily.     nitroGLYCERIN 0.4 MG SL tablet  Commonly known as:  NITROSTAT  Place 0.4 mg under the tongue every 5 (five) minutes as needed for chest pain.     oxyCODONE-acetaminophen 5-325 MG per tablet  Commonly known as:  PERCOCET/ROXICET  Take 1-2 tablets by mouth every 4 (four) hours as needed for moderate pain.     pantoprazole 40 MG tablet  Commonly known as:  PROTONIX  TAKE 1 TABLET BY MOUTH EVERY DAY     thiamine 100 MG tablet  Take 1 tablet (100 mg total) by mouth daily.     ticagrelor 90 MG Tabs tablet  Commonly known as:  BRILINTA  Take 1 tablet (90 mg total) by mouth 2 (two) times daily.      ASK your doctor about these medications       lisinopril 5 MG tablet  Commonly known as:  PRINIVIL,ZESTRIL  Take 5 mg by mouth daily.       Follow-up Information   Follow up with [facility]. (PT/ OT for vestibular therapy. Rehab to call patient with a time and cost of visit. )    Specialty:  Neuro-Rehabilitation   Contact information:   [address] [place] [id number] [place] [address] [phone]      Follow up with Sanda Klein, MD On  07/01/2013. (at 3:30pm  Dr. Victorino December PA)    Specialty:  Cardiology   Contact information:   [address] [place] [address] [phone]        Discharge Instructions: Take 1 NTG, under your tongue, while sitting.  If no relief of pain may repeat NTG, one tab every 5 minutes up to 3 tablets total over 15 minutes.  If no relief CALL 911.  If you have dizziness/lightheadness  while taking NTG, stop taking and call 911.        Call [place] at [phone] if any bleeding, swelling or drainage at cath site.  May shower, no tub baths for 48 hours for groin sticks.   No work until cleared by Dr. Sallyanne Kuster  No driving for 6 months.  Weigh daily Call [phone] if weight climbs more than 3 pounds in a day or 5 pounds in a week. No salt to very little salt in your diet.  No more than 2000 mg in a day. Call if increased shortness of breath or increased swelling.   wear life vest   Signed: Cecilie Kicks Nurse Practitioner-Certified [facility]: [facility] 06/20/2013, 11:39 AM  Time spent on discharge : >50 min minutes.

## 2013-06-20 NOTE — Progress Notes (Signed)
He has done well. Still with some dizziness. Has LifeVest in room. Ready for discharge today. Needs f/u with [facility] in 2 weeks and EP in 3.5 months after LV reassessed by echo. Needs echo set up for early September. Needs home health PT and Rolling walker with 5" wheels.

## 2013-06-20 NOTE — Discharge Instructions (Signed)
Take 1 NTG, under your tongue, while sitting.  If no relief of pain may repeat NTG, one tab every 5 minutes up to 3 tablets total over 15 minutes.  If no relief CALL 911.  If you have dizziness/lightheadness  while taking NTG, stop taking and call 911.        Call [facility] at [phone] if any bleeding, swelling or drainage at cath site.  May shower, no tub baths for 48 hours for groin sticks.   No work until cleared by Dr. Sallyanne Kuster  No driving for 6 months.  Weigh daily Call [phone] if weight climbs more than 3 pounds in a day or 5 pounds in a week. No salt to very little salt in your diet.  No more than 2000 mg in a day. Call if increased shortness of breath or increased swelling.   wear life vest

## 2013-06-20 NOTE — Telephone Encounter (Signed)
Rx was sent to pharmacy electronically. 

## 2013-06-20 NOTE — Progress Notes (Signed)
NUTRITION FOLLOW UP  Intervention:    Ensure Complete PO TID, each supplement provides 350 kcal and 13 grams of protein  Nutrition Dx:   Inadequate oral intake related to poor appetite as evidenced by 0-70% meal completion; ongoing.  Goal:   Intake to meet >90% of estimated nutrition needs. Unmet.  Monitor:   PO intake, labs, weight trend.  Assessment:   PMHx significant for MI. Admitted after collapse and PEA arrest at home.   Patient was extubated on 6/1. Was receiving TF during intubation. Diet has been advanced to heart healthy. PO intake has been poor to fair overall; consuming 0-70% of meals. Patient c/o poor appetite, says he has been eating very little of his meals. Agreed to drink Ensure 2-3 times daily to help maximize oral intake.  Height: 06/09/13          5\' 9"  (1.753 m)    Weight Status:   Wt Readings from Last 1 Encounters:  06/19/13 173 lb (78.472 kg)    Re-estimated needs:  Kcal: 2000-2200 Protein: 90-110 gm Fluid: 2-2.2 L  Skin: Right arm laceration, left arm incision   Diet Order: Cardiac   Intake/Output Summary (Last 24 hours) at 06/20/13 0925 Last data filed at 06/20/13 0816  Gross per 24 hour  Intake    480 ml  Output      0 ml  Net    480 ml    Last BM: 6/6   Labs:   Recent Labs Lab 06/14/13 0430 06/15/13 0415  NA 138 136*  K 4.5 4.3  CL 103 101  CO2 26 27  BUN 10 13  CREATININE 1.11 1.06  CALCIUM 8.3* 8.2*  PHOS 3.5 2.7  GLUCOSE 111* 93    CBG (last 3)  No results found for this basename: GLUCAP,  in the last 72 hours  Scheduled Meds: . antiseptic oral rinse  15 mL Mouth Rinse BID  . aspirin  81 mg Per Tube Daily  . carvedilol  3.125 mg Oral BID WC  . chlorhexidine  15 mL Mouth/Throat BID  . enoxaparin (LOVENOX) injection  40 mg Subcutaneous Q24H  . pantoprazole  40 mg Oral Daily  . sodium chloride  10-40 mL Intracatheter Q12H  . thiamine  100 mg Oral Daily  . ticagrelor  90 mg Oral BID    Continuous Infusions:   None   Molli Barrows, RD, LDN, [place] Pager [phone] After Hours Pager [phone]

## 2013-06-20 NOTE — Discharge Summary (Signed)
This is a very complicated situation and he has done relatively well considering the presentation. He still has chest soreness but no angina or CHF complaints. Still "dizzy".  Plan is to continue heart failure therapy and further optimize as an OP as tolerated by BP. Reassess LV function in 3 months. LifeVest for 3 months. EP in 3.5 months to assess need for AICD. Will follow-up with Dr. Sallyanne Kuster. Or Tamala Julian as needed.

## 2013-06-20 NOTE — Progress Notes (Signed)
Removed left subclavian TL CVC per order.  Blue tip was intact.  Instructed to remain in bed for 30 minutes.   Also to keep drsg D/I for 24 hrs.    Pt verbalized understanding.     (Charted here because Doc Flowsheet would not accept the removal date) so I could complete the charting in the flowsheet.

## 2013-06-20 NOTE — Progress Notes (Signed)
CARDIAC REHAB PHASE I   PRE:  Rate/Rhythm: 61     BP: sitting 110/64, standing 92/62    SaO2: 95 RA  MODE:  Ambulation: 180 ft   POST:  Rate/Rhythm: 89 SR    BP: sitting 120/60     SaO2: 96 RA  Pt tolerating ambulation better today. Used RW, no assist needed. Stated he had lightheadedness, seems like it is improving. Still feels weak and did not want to go farther. BP stable, see above. Ed completed with discussion of MI, restrictions, antiplatelet, smoking cessation, diet (including reducing sugars), ex, NTG and CRPII. Pt interested in CRPII and will send referral to [facility]. Gave financial aid application.  [place], ACSM 06/20/2013 11:55 AM

## 2013-06-21 ENCOUNTER — Ambulatory Visit: Payer: Self-pay | Admitting: Cardiology

## 2013-06-24 ENCOUNTER — Telehealth: Payer: Self-pay | Admitting: *Deleted

## 2013-06-24 NOTE — Telephone Encounter (Signed)
Signed order for Phase II cardiac rehab faxed to [facility].

## 2013-06-27 ENCOUNTER — Other Ambulatory Visit: Payer: Self-pay | Admitting: Cardiology

## 2013-06-27 ENCOUNTER — Telehealth: Payer: Self-pay | Admitting: Cardiology

## 2013-06-27 NOTE — Telephone Encounter (Signed)
When he was in the hospital [name] prescribed him some [name] is out of it. Would she please call in a new refill to [facility]-[phone].He has an appt with ner this week.

## 2013-06-27 NOTE — Telephone Encounter (Signed)
Deferred to Mickel Baas, NP to advise on refill

## 2013-06-27 NOTE — Telephone Encounter (Signed)
You could refill once under Dr. Victorino December name but I cannot refill this med.  Or since the order needs to be signed ask Dr. Claiborne Billings if he would sign for 30 more though the pt should not need if it is refilled after one more refill.  I cannot reorder narcotics.

## 2013-06-27 NOTE — Telephone Encounter (Signed)
[name] had written for oxyCODONE-acetaminophen (PERCOCET/ROXICET) 5-325 MG per tablet  #30 tablet with NO refills on 06/20/2013 at hospital discharge.   Patient is requesting refills, but Edward Baas, NP cannot refills narcotics.   Can you authorize this refill?

## 2013-06-27 NOTE — Telephone Encounter (Signed)
RN spoke with Dr. Ellyn Hack. Cannot authorize refills for this class of narcotic without a face to face office visit with a practitioner. Patient was notified. Patient has OV with [place], [place] 6/19

## 2013-06-29 ENCOUNTER — Encounter: Payer: Self-pay | Admitting: *Deleted

## 2013-06-30 ENCOUNTER — Encounter: Payer: Self-pay | Admitting: Cardiovascular Disease

## 2013-07-01 ENCOUNTER — Ambulatory Visit (INDEPENDENT_AMBULATORY_CARE_PROVIDER_SITE_OTHER): Payer: Medicaid Other | Admitting: Cardiology

## 2013-07-01 ENCOUNTER — Encounter: Payer: Self-pay | Admitting: Cardiology

## 2013-07-01 VITALS — BP 130/80 | HR 90 | Ht 67.0 in | Wt 174.0 lb

## 2013-07-01 DIAGNOSIS — I255 Ischemic cardiomyopathy: Secondary | ICD-10-CM

## 2013-07-01 DIAGNOSIS — I469 Cardiac arrest, cause unspecified: Secondary | ICD-10-CM

## 2013-07-01 DIAGNOSIS — I2109 ST elevation (STEMI) myocardial infarction involving other coronary artery of anterior wall: Secondary | ICD-10-CM

## 2013-07-01 DIAGNOSIS — Z789 Other specified health status: Secondary | ICD-10-CM

## 2013-07-01 DIAGNOSIS — I2589 Other forms of chronic ischemic heart disease: Secondary | ICD-10-CM

## 2013-07-01 DIAGNOSIS — Z9189 Other specified personal risk factors, not elsewhere classified: Secondary | ICD-10-CM

## 2013-07-01 MED ORDER — CARVEDILOL 6.25 MG PO TABS: 6.2500 mg | ORAL_TABLET | Freq: Two times a day (BID) | ORAL | Status: DC

## 2013-07-01 MED ORDER — TRAMADOL HCL 50 MG PO TABS: 50.0000 mg | ORAL_TABLET | Freq: Four times a day (QID) | ORAL | Status: DC | PRN

## 2013-07-01 NOTE — Patient Instructions (Addendum)
1. Your physician recommends that you schedule a follow-up appointment in:  6 weeks with Dr. Sallyanne Kuster or Lurena Joiner PA if Dr.C not available  2.Increase your coreg to  6.25 mg two times a day

## 2013-07-01 NOTE — Assessment & Plan Note (Signed)
EF 40% at cath, 10% by echo, and 33% by [facility]

## 2013-07-01 NOTE — Progress Notes (Signed)
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 He has a history of CAD and is S/P CABG in '06. He apparently had a subsequent CFX stent. He tells me he had an MI in Jan 2015 and was treated medically. In May 2015 he went to his Primary for shoulder pain and his Troponin was elevated and he was sent to the ER. He was admitted and had a cath and subsequent SVG-LAD DES placed on 06/02/13. He was discharge on Plavix. On 06/09/13 he was found down at home by his son. CPR was started and EMS called. He was noted to be in VF. He was transported to [facility]. He had shock, respiratory failure, shock liver, and acute renal insufficiency. He underwent cath 06/09/13 by Dr Tamala Julian which revealed widely patent saphenous vein graft to the mid LAD. This vessel is stented and there is no evidence of ISR. Widely patent proximal to mid circumflex stent. There is haziness in the circumflex proximal to the stent but no significant obstruction.  Mild to moderate distal left main stenosis in the 30-40% range with 80-90% ostial LAD threatening a small to moderate diagonal territory in the distribution of the wall motion abnormality. The mid LAD off the retrograde segment from the graft insertion site is also occluded. Widely patent RCA. Plan was for Myoview and this showed no ischemia. His P2Y12 was elevated and he was taken off Plavix and put on Brilinita. He was discharge 06/20/13 with a Armed forces training and education officer.  He is in the office today for follow up. He denies any angina. His chest is still sore from CPR.  His Life Vest has not gone off.    Current Outpatient Prescriptions  Medication Sig Dispense Refill  . ALPRAZolam (XANAX) 0.5 MG tablet Take 0.5 mg by mouth See admin instructions. Take 1 tablet by mouth twice daily, allow for additional tablet as needed      . aspirin EC 81 MG tablet  Take 81 mg by mouth daily.      Marland Kitchen buPROPion (WELLBUTRIN XL) 300 MG 24 hr tablet Take 300 mg by mouth daily.      . carvedilol (COREG) 6.25 MG tablet Take 1 tablet (6.25 mg total) by mouth 2 (two) times daily with a meal.  180 tablet  0  . feeding supplement, ENSURE COMPLETE, (ENSURE COMPLETE) LIQD Take 237 mLs by mouth 3 (three) times daily between meals.      Marland Kitchen lisinopril (PRINIVIL,ZESTRIL) 5 MG tablet Take 5 mg by mouth daily.      Marland Kitchen loratadine (CLARITIN) 10 MG tablet Take 1 tablet (10 mg total) by mouth daily.  30 tablet  0  . Multiple Vitamin (MULTIVITAMIN WITH MINERALS) TABS tablet Take 1 tablet by mouth daily.      . nitroGLYCERIN (NITROSTAT) 0.4 MG SL tablet Place 0.4 mg under the tongue every 5 (five) minutes as needed for chest pain.      . pantoprazole (PROTONIX) 40 MG tablet TAKE 1 TABLET BY MOUTH EVERY DAY  90 tablet  0  . thiamine 100 MG tablet Take 1 tablet (100 mg total) by mouth daily.  30 tablet  2  . ticagrelor (BRILINTA) 90 MG TABS tablet Take 1 tablet (90 mg total) by mouth 2 (two) times daily.  60 tablet  11  . traMADol (ULTRAM) 50 MG tablet Take 1 tablet (  50 mg total) by mouth every 6 (six) hours as needed for moderate pain.  30 tablet  0   No current facility-administered medications for this visit.    No Known Allergies  History   Social History  . Marital Status: Married    Spouse Name: N/A    Number of Children: 74  . Years of Education: 10   Occupational History  . carpenter    Social History Main Topics  . Smoking status: Current Every Day Smoker -- 4.00 packs/day for 45 years    Types: Cigarettes  . Smokeless tobacco: Not on file  . Alcohol Use: Not on file  . Drug Use: Not on file  . Sexual Activity: Not on file   Other Topics Concern  . Not on file   Social History Narrative  . No narrative on file     Review of Systems: General: negative for chills, fever, night sweats or weight changes.  Cardiovascular: negative for chest pain, dyspnea on  exertion, edema, orthopnea, palpitations, paroxysmal nocturnal dyspnea or shortness of breath Dermatological: negative for rash Respiratory: negative for cough or wheezing Urologic: negative for hematuria Abdominal: negative for nausea, vomiting, diarrhea, bright red blood per rectum, melena, or hematemesis Neurologic: negative for visual changes, syncope, or dizziness All other systems reviewed and are otherwise negative except as noted above.    Blood pressure 130/80, pulse 90, height 5\' 7"  (1.702 m), weight 174 lb (78.926 kg).  General appearance: alert, cooperative and no distress Lungs: clear to auscultation bilaterally Heart: regular rate and rhythm  EKG NSR, NSST changes  ASSESSMENT AND PLAN:   Acute MI, troponin > 20, no obvious culprit vessel, NSTEMI anterior wall Presented as an out of hospital VF arrest 06/09/13  At risk for sudden cardiac death, has lifevest at discharge Plan is to evaluate LVF in 3 months  Cardiac arrest, V fib on initial tracing shocked to SR this was followed by PEA, successfully resuscitated  06/09/13  Ischemic cardiomyopathy, EF 35% EF 40% at cath, 10% by echo, and 33% by [place]  He asked about returning to work and I discussed this with dr Gwenlyn Found. The pt doesn't drive or operate machinery. He doesn't do strenuous activity. We feel he can return to work 07/18/13. I increased his Coreg to 6.25 mg BID. We'll see him back in 6 weeks and plan on getting an echo Sept 1st.  KILROY,LUKE KPA-C 07/01/2013 3:52 PM

## 2013-07-01 NOTE — Assessment & Plan Note (Signed)
Plan is to evaluate LVF in 3 months

## 2013-07-01 NOTE — Assessment & Plan Note (Signed)
06-09-13

## 2013-07-01 NOTE — Assessment & Plan Note (Signed)
Presented as an out of hospital VF arrest 06/09/13

## 2013-07-12 ENCOUNTER — Other Ambulatory Visit (HOSPITAL_COMMUNITY): Payer: Self-pay | Admitting: Cardiology

## 2013-08-09 ENCOUNTER — Encounter: Payer: Self-pay | Admitting: Cardiology

## 2013-08-09 ENCOUNTER — Ambulatory Visit (INDEPENDENT_AMBULATORY_CARE_PROVIDER_SITE_OTHER): Payer: Self-pay | Admitting: Cardiology

## 2013-08-09 VITALS — BP 117/90 | HR 93 | Ht 67.0 in | Wt 182.4 lb

## 2013-08-09 DIAGNOSIS — Z79899 Other long term (current) drug therapy: Secondary | ICD-10-CM

## 2013-08-09 DIAGNOSIS — I469 Cardiac arrest, cause unspecified: Secondary | ICD-10-CM

## 2013-08-09 DIAGNOSIS — K7200 Acute and subacute hepatic failure without coma: Secondary | ICD-10-CM

## 2013-08-09 DIAGNOSIS — I255 Ischemic cardiomyopathy: Secondary | ICD-10-CM

## 2013-08-09 DIAGNOSIS — I2589 Other forms of chronic ischemic heart disease: Secondary | ICD-10-CM

## 2013-08-09 MED ORDER — CARVEDILOL 12.5 MG PO TABS: 12.5000 mg | ORAL_TABLET | Freq: Two times a day (BID) | ORAL | Status: DC

## 2013-08-09 NOTE — Patient Instructions (Signed)
Your physician recommends that you schedule a follow-up appointment Dr Sallyanne Kuster After Echo  Your physician has recommended you make the following change in your medication: Increase Carvedilol 12.5 mg twice a day  Your physician has requested that you have an echocardiogram. Echocardiography is a painless test that uses sound waves to create images of your heart. It provides your doctor with information about the size and shape of your heart and how well your heart's chambers and valves are working. This procedure takes approximately one hour. There are no restrictions for this procedure. September  Your physician recommends that you return for lab work CMP today

## 2013-08-09 NOTE — Assessment & Plan Note (Signed)
Currently wearing a Life Vest

## 2013-08-09 NOTE — Assessment & Plan Note (Signed)
Check follow up LFTs before starting a statin

## 2013-08-09 NOTE — Assessment & Plan Note (Signed)
Patent stents at cath 06/15/13

## 2013-08-09 NOTE — Progress Notes (Signed)
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 He has a history of CAD and is S/P CABG in '06 x 1. He apparently had a subsequent CFX stent. He tells me he had an MI in Jan 2015 and was treated medically. In May 2015 he went to his Primary Care Provider for shoulder pain and his Troponin was elevated. He was sent to the ER at [facility]. He was admitted and had a cath and subsequent SVG-LAD DES placed on 06/02/13 there. He was discharge on Plavix. On 06/09/13 he was found down at home by his son. CPR was started and EMS called. He was noted to be in VF. He was transported to [facility]. He had shock, respiratory failure, shock liver, and acute renal insufficiency. He underwent cath 06/09/13 by Dr Tamala Julian which revealed widely patent saphenous vein graft to the mid LAD. This vessel is stented and there is no evidence of ISR. Widely patent proximal to mid circumflex stent. There is haziness in the circumflex proximal to the stent but no significant obstruction. Mild to moderate distal left main stenosis in the 30-40% range with 80-90% ostial LAD threatening a small to moderate diagonal territory in the distribution of the wall motion abnormality. The mid LAD off the retrograde segment from the graft insertion site is also occluded. Widely patent RCA. A Myoview and this showed no ischemia. His P2Y12 was elevated and he was taken off Plavix and put on Brilinita. He was discharged 06/20/13 with a Life Vest.  I saw him in follow up 07/01/13. He has been doing well. I increased his Coreg at his last OV. He has not smoked since 5/28. His Life Vest has not fired. He is back to work 3 days a week.    Current Outpatient Prescriptions  Medication Sig Dispense Refill  . ALPRAZolam (XANAX) 0.5 MG tablet Take 0.5 mg by mouth See admin instructions. Take 1 tablet by mouth twice daily,  allow for additional tablet as needed      . aspirin EC 81 MG tablet Take 81 mg by mouth daily.      Marland Kitchen buPROPion (WELLBUTRIN XL) 300 MG 24 hr tablet Take 300 mg by mouth daily.      . carvedilol (COREG) 6.25 MG tablet Take 1 tablet (6.25 mg total) by mouth 2 (two) times daily with a meal.  180 tablet  0  . feeding supplement, ENSURE COMPLETE, (ENSURE COMPLETE) LIQD Take 237 mLs by mouth 3 (three) times daily between meals.      Marland Kitchen lisinopril (PRINIVIL,ZESTRIL) 5 MG tablet Take 5 mg by mouth daily.      Marland Kitchen loratadine (CLARITIN) 10 MG tablet Take 1 tablet (10 mg total) by mouth daily.  30 tablet  0  . Multiple Vitamin (MULTIVITAMIN WITH MINERALS) TABS tablet Take 1 tablet by mouth daily.      . nitroGLYCERIN (NITROSTAT) 0.4 MG SL tablet Place 0.4 mg under the tongue every 5 (five) minutes as needed for chest pain.      . pantoprazole (PROTONIX) 40 MG tablet TAKE 1 TABLET BY MOUTH EVERY DAY  90 tablet  0  . thiamine 100 MG tablet Take 1 tablet (100 mg total) by mouth daily.  30 tablet  2  . ticagrelor (BRILINTA) 90 MG TABS tablet Take 1 tablet (90 mg total) by mouth 2 (two) times  daily.  60 tablet  11   No current facility-administered medications for this visit.    No Known Allergies  History   Social History  . Marital Status: Married    Spouse Name: N/A    Number of Children: 24  . Years of Education: 10   Occupational History  . carpenter    Social History Main Topics  . Smoking status: Current Every Day Smoker -- 4.00 packs/day for 45 years    Types: Cigarettes  . Smokeless tobacco: Not on file  . Alcohol Use: Not on file  . Drug Use: Not on file  . Sexual Activity: Not on file   Other Topics Concern  . Not on file   Social History Narrative  . No narrative on file     Review of Systems: General: negative for chills, fever, night sweats or weight changes.  Cardiovascular: negative for chest pain, dyspnea on exertion, edema, orthopnea, palpitations, paroxysmal nocturnal  dyspnea or shortness of breath Dermatological: negative for rash Respiratory: negative for cough or wheezing Urologic: negative for hematuria Abdominal: negative for nausea, vomiting, diarrhea, bright red blood per rectum, melena, or hematemesis Neurologic: negative for visual changes, syncope, or dizziness All other systems reviewed and are otherwise negative except as noted above.    Blood pressure 117/90, pulse 93, height 5\' 7"  (1.702 m), weight 182 lb 6.4 oz (82.736 kg).  General appearance: alert, cooperative and no distress Lungs: clear to auscultation bilaterally Heart: regular rate and rhythm  EKG NSR-HR 93, Septal Qs ant TWI  ASSESSMENT AND PLAN:   Acute MI, troponin > 20, no obvious culprit vessel, NSTEMI anterior wall Out of hospital cardiac arrest 06/09/13  Cardiac arrest, V fib on initial tracing shocked to SR this was followed by PEA, successfully resuscitated  Currently wearing a Life Vest  Ischemic cardiomyopathy, EF 35% Repeat echo in September 2015  Shock liver Check follow up LFTs before starting a statin  CAD-hx of CABG '06, stents to VG-LAD 5/21/5 at [facility] stents at cath 06/15/13   PLAN  He has a bottle of Lipitor 20 mg and asked if he should start this. Apparently it was prescribed after his stent was placed at [facility]. I suggested we document his LFTs first as they were high at discharge. I also increased his Coreg to 12.5 mg BID. He will get an echo in September and then follow up with dr Sallyanne Kuster.   Promise Bushong KPA-C 08/09/2013 11:52 AM

## 2013-08-09 NOTE — Assessment & Plan Note (Signed)
Repeat echo in September 2015

## 2013-08-09 NOTE — Assessment & Plan Note (Signed)
Out of hospital cardiac arrest 06/09/13

## 2013-08-10 ENCOUNTER — Telehealth: Payer: Self-pay | Admitting: *Deleted

## 2013-08-10 LAB — COMPREHENSIVE METABOLIC PANEL
ALT: 34 U/L (ref 0–53)
AST: 34 U/L (ref 0–37)
Albumin: 4.2 g/dL (ref 3.5–5.2)
Alkaline Phosphatase: 131 U/L — ABNORMAL HIGH (ref 39–117)
BUN: 13 mg/dL (ref 6–23)
CO2: 24 mEq/L (ref 19–32)
Calcium: 9.7 mg/dL (ref 8.4–10.5)
Chloride: 105 mEq/L (ref 96–112)
Creat: 1.16 mg/dL (ref 0.50–1.35)
Glucose, Bld: 103 mg/dL — ABNORMAL HIGH (ref 70–99)
Potassium: 4.6 mEq/L (ref 3.5–5.3)
Sodium: 140 mEq/L (ref 135–145)
Total Bilirubin: 0.5 mg/dL (ref 0.2–1.2)
Total Protein: 6.8 g/dL (ref 6.0–8.3)

## 2013-08-10 NOTE — Telephone Encounter (Signed)
Message copied by Vennie Homans on Wed Aug 10, 2013  5:05 PM ------      Message from: Erlene Quan      Created: Wed Aug 10, 2013 10:28 AM       Please let pt know his LFTs are OK, he should start Lipitor 20 mg daily. He has this already at home.            Kerin Ransom PA-C      08/10/2013      10:28 AM       ------

## 2013-08-10 NOTE — Telephone Encounter (Signed)
Call patient and informed him about his recent blood work also told patient to start lipitor 20 mg daily as per Lurena Joiner, patient verbally understand.

## 2013-08-13 ENCOUNTER — Other Ambulatory Visit: Payer: Self-pay | Admitting: Cardiology

## 2013-08-15 ENCOUNTER — Other Ambulatory Visit: Payer: Self-pay | Admitting: *Deleted

## 2013-08-15 MED ORDER — TICAGRELOR 90 MG PO TABS: 90.0000 mg | ORAL_TABLET | Freq: Two times a day (BID) | ORAL | Status: DC

## 2013-08-15 NOTE — Telephone Encounter (Signed)
Rx refill came into in-basket for [place] to be refilled to [facility], yet Rx was just filled in June to [facility] on [address]. Spoke with patient to clarify which pharmacy to use and patient stated he needed Rx to be filled at [facility] on [address]. Rx was sent into correct pharmacy.

## 2013-08-15 NOTE — Telephone Encounter (Signed)
Rx refill denied, this refill came through the [facility] but is for the wrong pharmacy

## 2013-09-08 ENCOUNTER — Telehealth: Payer: Self-pay | Admitting: *Deleted

## 2013-09-08 NOTE — Telephone Encounter (Signed)
Signed order for Life Vest faxed to [facility].

## 2013-09-27 ENCOUNTER — Ambulatory Visit (HOSPITAL_COMMUNITY)
Admission: RE | Admit: 2013-09-27 | Discharge: 2013-09-27 | Disposition: A | Discharge status: Home / Self Care | Payer: Self-pay | Source: Ambulatory Visit | Attending: Internal Medicine | Admitting: Internal Medicine

## 2013-09-27 DIAGNOSIS — I214 Non-ST elevation (NSTEMI) myocardial infarction: Secondary | ICD-10-CM

## 2013-09-27 DIAGNOSIS — I10 Essential (primary) hypertension: Secondary | ICD-10-CM | POA: Insufficient documentation

## 2013-09-27 DIAGNOSIS — F17200 Nicotine dependence, unspecified, uncomplicated: Secondary | ICD-10-CM | POA: Insufficient documentation

## 2013-09-27 DIAGNOSIS — I255 Ischemic cardiomyopathy: Secondary | ICD-10-CM

## 2013-09-27 DIAGNOSIS — Z8249 Family history of ischemic heart disease and other diseases of the circulatory system: Secondary | ICD-10-CM | POA: Insufficient documentation

## 2013-09-27 DIAGNOSIS — I517 Cardiomegaly: Secondary | ICD-10-CM

## 2013-09-27 DIAGNOSIS — E785 Hyperlipidemia, unspecified: Secondary | ICD-10-CM | POA: Insufficient documentation

## 2013-09-27 DIAGNOSIS — I469 Cardiac arrest, cause unspecified: Secondary | ICD-10-CM | POA: Insufficient documentation

## 2013-09-27 NOTE — Progress Notes (Signed)
2D Echocardiogram Complete.  09/27/2013   [name], Friend

## 2013-10-05 ENCOUNTER — Encounter: Payer: Self-pay | Admitting: Cardiovascular Disease

## 2013-10-05 ENCOUNTER — Ambulatory Visit (INDEPENDENT_AMBULATORY_CARE_PROVIDER_SITE_OTHER): Payer: Self-pay | Admitting: Cardiovascular Disease

## 2013-10-05 VITALS — BP 125/80 | HR 79 | Resp 16 | Ht 67.0 in | Wt 185.1 lb

## 2013-10-05 DIAGNOSIS — I25810 Atherosclerosis of coronary artery bypass graft(s) without angina pectoris: Secondary | ICD-10-CM

## 2013-10-05 DIAGNOSIS — I25118 Atherosclerotic heart disease of native coronary artery with other forms of angina pectoris: Secondary | ICD-10-CM

## 2013-10-05 DIAGNOSIS — I2589 Other forms of chronic ischemic heart disease: Secondary | ICD-10-CM

## 2013-10-05 DIAGNOSIS — I2510 Atherosclerotic heart disease of native coronary artery without angina pectoris: Secondary | ICD-10-CM

## 2013-10-05 DIAGNOSIS — I739 Peripheral vascular disease, unspecified: Secondary | ICD-10-CM

## 2013-10-05 DIAGNOSIS — I209 Angina pectoris, unspecified: Secondary | ICD-10-CM

## 2013-10-05 DIAGNOSIS — E785 Hyperlipidemia, unspecified: Secondary | ICD-10-CM

## 2013-10-05 DIAGNOSIS — I255 Ischemic cardiomyopathy: Secondary | ICD-10-CM

## 2013-10-05 MED ORDER — LISINOPRIL 10 MG PO TABS: 10.0000 mg | ORAL_TABLET | Freq: Every day | ORAL | Status: DC

## 2013-10-05 NOTE — Patient Instructions (Signed)
INCREASE Lisinopril to 10mg  daily.  Your physician has requested that you have a lower extremity arterial exercise duplex. During this test, exercise and ultrasound are used to evaluate arterial blood flow in the legs. Allow one hour for this exam. There are no restrictions or special instructions.  Dr. Sallyanne Kuster recommends that you schedule a follow-up appointment in: 3 months.

## 2013-10-06 ENCOUNTER — Telehealth: Payer: Self-pay | Admitting: *Deleted

## 2013-10-06 NOTE — Telephone Encounter (Signed)
D/C order sent for external defib on 09/28/13.

## 2013-10-09 ENCOUNTER — Encounter: Payer: Self-pay | Admitting: Cardiovascular Disease

## 2013-10-09 DIAGNOSIS — I739 Peripheral vascular disease, unspecified: Secondary | ICD-10-CM

## 2013-10-09 DIAGNOSIS — E785 Hyperlipidemia, unspecified: Secondary | ICD-10-CM | POA: Insufficient documentation

## 2013-10-09 HISTORY — DX: Peripheral vascular disease, unspecified: I73.9

## 2013-10-09 HISTORY — DX: Hyperlipidemia, unspecified: E78.5

## 2013-10-09 NOTE — Progress Notes (Signed)
Patient ID: [name], male   DOB: [birth year], 58 y.o.   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 He is back to work. He is the single parent of 5 children ages 45-13 years.  He denies any cardiac complaints at this time. He describes bilateral intermittent claudication of the lower extremities.  Multiple events related to CAD this year: - CAD S/P CABG in '06 (SVG-LAD).  - January 2015 acute MI, proximal circumflex artery drug-eluting  2.5x16 mm Xience stent at [facility] - May 2015 he went to his PCP for shoulder pain and his Troponin was elevated. He had stenosis in the SVG-LAD and a DES placed on 06/02/13 at [facility]. He was discharge on Plavix.  - 06/09/13 he was found unresponsive at home by his son. CPR was started and EMS called. He was noted to be in VF. He was transported to [facility]. He had shock, respiratory failure, shock liver, and acute renal insufficiency. Cath revealed widely patent saphenous vein graft to the mid LAD (stented with no evidence of thrombus or restenosis). Widely patent proximal to mid circumflex sten, with haziness in the circumflex proximal to the stent but no significant obstruction. Mild to moderate distal left main stenosis (30-40%) with 80-90% ostial LAD, threatening a small to moderate diagonal territory in the distribution of the wall motion abnormality. The mid LAD off the retrograde segment from the graft insertion site was occluded. Widely patent RCA. A Myoview showed no ischemia. Echo showed left ventricular ejection fraction of 10-15%. His P2Y12 was elevated and he was taken off Plavix and put on Brilinita. Discharged 06/20/13 with a Life Vest.  - 09/27/13 ECHO showed remarkable improvement with LVEF of 45-50%. Wall motion abnormalities were  seen in the proximal LAD artery territory as well as the apical septum.  He also has extensive peripheral arterial disease. Occluded left common carotid and moderate right internal carotid artery stenosis. Previous stent the left subclavian artery. Right iliac stent. Bilateral 60-99% renal artery stenosis   No Known Allergies  Current Outpatient Prescriptions  Medication Sig Dispense Refill  . ALPRAZolam (XANAX) 0.5 MG tablet Take 0.5 mg by mouth See admin instructions. Take 1 tablet by mouth twice daily, allow for additional tablet as needed      . aspirin EC 81 MG tablet Take 81 mg by mouth daily.      Marland Kitchen buPROPion (WELLBUTRIN XL) 300 MG 24 hr tablet Take 300 mg by mouth daily.      . carvedilol (COREG) 12.5 MG tablet Take 1 tablet (12.5 mg total) by mouth 2 (two) times daily.  60 tablet  9  . feeding supplement, ENSURE COMPLETE, (ENSURE COMPLETE) LIQD Take 237 mLs by mouth 3 (three) times daily between meals.      . Multiple Vitamin (MULTIVITAMIN WITH MINERALS) TABS tablet Take 1 tablet by mouth daily.      . nitroGLYCERIN (NITROSTAT) 0.4 MG SL tablet Place 0.4 mg under the tongue every 5 (five) minutes as needed for chest pain.      . pantoprazole (PROTONIX) 40 MG tablet TAKE 1 TABLET BY MOUTH EVERY DAY  90 tablet  0  . thiamine 100 MG tablet Take 1 tablet (100 mg total) by mouth daily.  30 tablet  2  .  ticagrelor (BRILINTA) 90 MG TABS tablet Take 1 tablet (90 mg total) by mouth 2 (two) times daily.  60 tablet  5  . lisinopril (PRINIVIL,ZESTRIL) 10 MG tablet Take 1 tablet (10 mg total) by mouth daily.  90 tablet  3   No current facility-administered medications for this visit.    Past Medical History  Diagnosis Date  . Carotid stenosis   . Coronary artery disease     s/p CABG 2006; stenting to vein graft 05-2013; NSTEMI 06-2013 in setting of PEA arrest  . Subclavian artery stenosis, left     s/p stenting 2006  . Hypertension   . Hyperlipidemia   . Ischemic cardiomyopathy     echo  06/2013 EF 10-15% (normal 01/2013)  . At risk for sudden cardiac death, has lifevest at discharge 06/20/2013  . Shock liver 06/20/2013  . Acute MI, troponin > 20, no obvious culprit vessel 06/20/2013  . Hypothermia, induced, post arrest 06/20/2013  . Acute encephalopathy, improved 06/20/2013  . Dizziness 06/20/2013  . Weakness due to cardiac arrest 06/20/2013  . Fever, possible aspiration-treated with antibiotics 06/20/2013  . Tobacco abuse     >100 pack year history     Past Surgical History  Procedure Laterality Date  . Angioplasty  05/2013    stent to SVG - LAD vein graft at [facility]  . Coronary artery bypass graft  09/2004    SVG to LAD (at [facility])  . Nm myocar perf wall motion  06/2008    persantine myoview - normal pattern of systolic thickening/wall motion/perfusion;  . Lower extremity arterial doppler  08/2008    right and left ABI - mild arterial insufficiency at rest; R CIA/stent with 50-69% narrowing, L CIA with increased velocities (50-69% diameter reduction)  . Renal artery doppler  05/2008    right and left prox renal arteries with narrowing and increased velocities (60-99% diameter reduction)  . Iliac artery stent  07/24/2008    8x6 Smart nitinol self-expanding stent to origin of ilaci down into external iliac crossing the hypogastric artery (Dr. Adora Fridge)  . Subclavian angiogram Left 08/20/2004    8x94mm Genesis balloon mounted stent    Family History  Problem Relation Age of Onset  . Heart disease Maternal Aunt   . Heart attack Mother   . Stroke Mother   . Cancer Father   . Stroke Sister   . Heart Problems Sister     History   Social History  . Marital Status: Married    Spouse Name: N/A    Number of Children: 21  . Years of Education: 10   Occupational History  . carpenter    Social History Main Topics  . Smoking status: Current Every Day Smoker -- 4.00 packs/day for 45 years    Types: Cigarettes  . Smokeless tobacco: Not on file  . Alcohol Use: Not on file  . Drug Use: Not  on file  . Sexual Activity: Not on file   Other Topics Concern  . Not on file   Social History Narrative  . No narrative on file    Review of systems: Bilateral intermittent claudication roughly 300 feet The patient specifically denies any chest pain at rest or with exertion, dyspnea at rest or with exertion, orthopnea, paroxysmal nocturnal dyspnea, syncope, palpitations, focal neurological deficits, lower extremity edema, unexplained weight gain, cough, hemoptysis or wheezing.  The patient also denies abdominal pain, nausea, vomiting, dysphagia, diarrhea, constipation, polyuria, polydipsia, dysuria, hematuria, frequency, urgency, abnormal bleeding or bruising,  fever, chills, unexpected weight changes, mood swings, change in skin or hair texture, change in voice quality, auditory or visual problems, allergic reactions or rashes, new musculoskeletal complaints other than usual "aches and pains".   PHYSICAL EXAM BP 125/80  Pulse 79  Resp 16  Ht 5\' 7"  (1.702 m)  Wt 83.961 kg (185 lb 1.6 oz)  BMI 28.98 kg/m2  General: Alert, oriented x3, no distress Head: no evidence of trauma, PERRL, EOMI, no exophtalmos or lid lag, no myxedema, no xanthelasma; normal ears, nose and oropharynx Neck: normal jugular venous pulsations and no hepatojugular reflux; brisk carotid pulses without delay and no carotid bruits Chest: clear to auscultation, no signs of consolidation by percussion or palpation, normal fremitus, symmetrical and full respiratory excursions Cardiovascular: normal position and quality of the apical impulse, regular rhythm, normal first and second heart sounds, no murmurs, rubs or gallops Abdomen: no tenderness or distention, no masses by palpation, no abnormal pulsatility or arterial bruits, normal bowel sounds, no hepatosplenomegaly Extremities: no clubbing, cyanosis or edema; 2+ radial, ulnar and brachial pulses bilaterally; 2+ right femoral, weak posterior tibial and dorsalis pedis  pulses; 2+ left femoral, weak posterior tibial and dorsalis pedis pulses; no subclavian or femoral bruits Neurological: grossly nonfocal  EKG: Sinus rhythm, QS in leads V1-V2, incomplete right bundle branch block, anteroseptal T wave inversion  BMET    Component Value Date/Time   NA 140 08/09/2013 1214   K 4.6 08/09/2013 1214   CL 105 08/09/2013 1214   CO2 24 08/09/2013 1214   GLUCOSE 103* 08/09/2013 1214   BUN 13 08/09/2013 1214   CREATININE 1.16 08/09/2013 1214   CREATININE 1.04 06/20/2013 1050   CALCIUM 9.7 08/09/2013 1214   GFRNONAA 76* 06/20/2013 1050   GFRAA 88* 06/20/2013 1050   LIPIDS 01/19/2013 total cholesterol 91, triglycerides 73, HDL 43, LDL 34 Not sure if he was on any lipid lowering therapy when these were drawn It seems she was enrolled in a clinical trial "[place] Study atorvastatin -eIRB # [id number] tablet Take 40 mg by mouth daily. For Investigational Use Only"   ASSESSMENT AND PLAN Ischemic cardiomyopathy, EF 45% Remarkable improvement in left ventricular systolic function following presentation with VF arrest and cardiogenic shock in the setting of non-ST segment elevation myocardial infarction with troponin greater than 20, likely transient thrombosis with reperfusion of this freshly placed stent. Appears clinically euvolemic, NYHA functional class I on appropriate treatment with ACE inhibitors and carvedilol. Try to increase ACE inhibitor dose today. Does not require ICD implantation.  CAD-hx of CABG '06, DES LCX 01/2013, DES SVG-LAD 5/21/5 at [facility] to be clopidogrel nonresponsive. Status post drug-eluting stent in May 2015. Keep on Brilinta for at least another 8 months. Consider lifelong dual antiplatelet therapy. Currently asymptomatic. He has stopped smoking. He is currently not on statin therapy. This may be since his shock liver. Will recheck a lipid profile.  Dyslipidemia Need to clarify whether he is still enrolled in a clinical trial. If I am reading the notes  from [place] correctly he is on open label atorvastatin 20 mg daily. 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 He is not taking IP. He is still taking 20 mg of atorvastatin sponsor provided. This visit was completed as per protocol. hHe left the clinic in stable condition"   PAD (peripheral artery disease) Occluded left common carotid and moderate right internal carotid artery stenosis. Previous stent to the left subclavian artery. Right  iliac stent. Bilateral 60-99% renal artery stenosis. He has bilateral intermittent claudication. Recheck lower extremity arterial Dopplers.    Patient Instructions  INCREASE Lisinopril to 10mg  daily.  Your physician has requested that you have a lower extremity arterial exercise duplex. During this test, exercise and ultrasound are used to evaluate arterial blood flow in the legs. Allow one hour for this exam. There are no restrictions or special instructions.  Dr. Sallyanne Kuster recommends that you schedule a follow-up appointment in: 3 months.      Orders Placed This Encounter  Procedures  . Lower Extremity Arterial Duplex   Meds ordered this encounter  Medications  . lisinopril (PRINIVIL,ZESTRIL) 10 MG tablet    Sig: Take 1 tablet (10 mg total) by mouth daily.    Dispense:  90 tablet    Refill:  [place] Rosalin Buster, MD, [facility] [phone] office [phone] pager

## 2013-10-09 NOTE — Assessment & Plan Note (Addendum)
Remarkable improvement in left ventricular systolic function following presentation with VF arrest and cardiogenic shock in the setting of non-ST segment elevation myocardial infarction with troponin greater than 20, likely transient thrombosis with reperfusion of this freshly placed stent. Appears clinically euvolemic, NYHA functional class I on appropriate treatment with ACE inhibitors and carvedilol. Try to increase ACE inhibitor dose today. Does not require ICD implantation.

## 2013-10-09 NOTE — Assessment & Plan Note (Signed)
Appears to be clopidogrel nonresponsive. Status post drug-eluting stent in May 2015. Keep on Brilinta for at least another 8 months. Consider lifelong dual antiplatelet therapy. Currently asymptomatic. He has stopped smoking. He is currently not on statin therapy. This may be since his shock liver. Will recheck a lipid profile.

## 2013-10-09 NOTE — Assessment & Plan Note (Addendum)
Need to clarify whether he is still enrolled in a clinical trial. If I am reading the notes from [place] correctly he is on open label atorvastatin 20 mg daily. 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 He is not taking IP. He is still taking 20 mg of atorvastatin sponsor provided. This visit was completed as per protocol. hHe left the clinic in stable condition"

## 2013-10-09 NOTE — Assessment & Plan Note (Signed)
Occluded left common carotid and moderate right internal carotid artery stenosis. Previous stent to the left subclavian artery. Right iliac stent. Bilateral 60-99% renal artery stenosis. He has bilateral intermittent claudication. Recheck lower extremity arterial Dopplers.

## 2013-10-11 ENCOUNTER — Telehealth: Payer: Self-pay | Admitting: Cardiovascular Disease

## 2013-10-11 NOTE — Telephone Encounter (Signed)
Closed encounter °

## 2013-10-19 ENCOUNTER — Encounter (HOSPITAL_COMMUNITY): Payer: Self-pay | Admitting: *Deleted

## 2013-10-19 ENCOUNTER — Encounter (HOSPITAL_COMMUNITY): Payer: Self-pay

## 2013-11-01 ENCOUNTER — Telehealth: Payer: Self-pay | Admitting: *Deleted

## 2013-11-01 NOTE — Telephone Encounter (Signed)
Signed Cardiac Rehab form faxed.

## 2013-11-01 NOTE — Telephone Encounter (Signed)
Signed order for Phase II Cardiac Rehab faxed.

## 2013-11-10 ENCOUNTER — Ambulatory Visit (HOSPITAL_COMMUNITY)
Admission: RE | Admit: 2013-11-10 | Discharge: 2013-11-10 | Disposition: A | Discharge status: Home / Self Care | Payer: Medicaid Other | Source: Ambulatory Visit | Attending: Cardiology | Admitting: Cardiology

## 2013-11-10 DIAGNOSIS — I739 Peripheral vascular disease, unspecified: Secondary | ICD-10-CM

## 2013-11-10 NOTE — Progress Notes (Signed)
Arterial Lower Ext. Duplex Completed. Allix Blomquist, BS, RDMS, RVT  

## 2013-11-21 ENCOUNTER — Telehealth: Payer: Self-pay | Admitting: *Deleted

## 2013-11-21 DIAGNOSIS — I739 Peripheral vascular disease, unspecified: Secondary | ICD-10-CM

## 2013-11-21 NOTE — Telephone Encounter (Signed)
-----   Message from Pixie Casino, MD sent at 11/21/2013  8:22 AM EST ----- Iliac stents are stable. Repeat LE arterial dopplers in 1 year.  Dr. Lemmie Evens

## 2013-11-21 NOTE — Telephone Encounter (Signed)
LM with doppler results.  Order placed for repeat in one year.

## 2013-12-19 ENCOUNTER — Telehealth: Payer: Self-pay | Admitting: Cardiovascular Disease

## 2013-12-20 NOTE — Telephone Encounter (Signed)
Closed encounter °

## 2013-12-22 ENCOUNTER — Encounter (HOSPITAL_COMMUNITY): Payer: Self-pay | Admitting: Interventional Cardiology

## 2013-12-26 ENCOUNTER — Ambulatory Visit (INDEPENDENT_AMBULATORY_CARE_PROVIDER_SITE_OTHER): Payer: Medicaid Other | Admitting: Cardiovascular Disease

## 2013-12-26 ENCOUNTER — Encounter: Payer: Self-pay | Admitting: Cardiovascular Disease

## 2013-12-26 VITALS — BP 112/62 | HR 85 | Ht 67.0 in | Wt 181.9 lb

## 2013-12-26 DIAGNOSIS — I5042 Chronic combined systolic (congestive) and diastolic (congestive) heart failure: Secondary | ICD-10-CM | POA: Insufficient documentation

## 2013-12-26 DIAGNOSIS — I2510 Atherosclerotic heart disease of native coronary artery without angina pectoris: Secondary | ICD-10-CM

## 2013-12-26 DIAGNOSIS — Z79899 Other long term (current) drug therapy: Secondary | ICD-10-CM

## 2013-12-26 DIAGNOSIS — I255 Ischemic cardiomyopathy: Secondary | ICD-10-CM

## 2013-12-26 DIAGNOSIS — R0602 Shortness of breath: Secondary | ICD-10-CM

## 2013-12-26 DIAGNOSIS — I25810 Atherosclerosis of coronary artery bypass graft(s) without angina pectoris: Secondary | ICD-10-CM

## 2013-12-26 DIAGNOSIS — I5043 Acute on chronic combined systolic (congestive) and diastolic (congestive) heart failure: Secondary | ICD-10-CM

## 2013-12-26 HISTORY — DX: Acute on chronic combined systolic (congestive) and diastolic (congestive) heart failure: I50.43

## 2013-12-26 MED ORDER — LISINOPRIL 10 MG PO TABS: ORAL_TABLET | ORAL | Status: DC

## 2013-12-26 MED ORDER — FUROSEMIDE 40 MG PO TABS: 40.0000 mg | ORAL_TABLET | Freq: Every day | ORAL | Status: DC

## 2013-12-26 MED ORDER — POTASSIUM CHLORIDE CRYS ER 20 MEQ PO TBCR: 20.0000 meq | EXTENDED_RELEASE_TABLET | Freq: Every day | ORAL | Status: DC

## 2013-12-26 NOTE — Patient Instructions (Addendum)
START Furosemide 40mg  daily.  START Potassium 34meq daily.  INCREASE Lisinopril to 10mg  in the mornings and 5mg  in the evenings times 2 weeks then INCREASE to 10mg  twice a day.  Your physician has requested that you have a lexiscan myoview. For further information please visit [email or web address]. Please follow instruction sheet, as given.  Your physician recommends that you return for lab work in: Jan 11, 2014 [facility].    Dr. Sallyanne Kuster recommends that you schedule a follow-up appointment in: Next available - January 2016    Your physician recommends that you weigh, daily, at the same time every day, and in the same amount of clothing. Please record your daily weights on the handout provided and bring it to your next appointment.   Daily Weight Record It is important to weigh yourself daily. Keep this daily weight chart near your scale. Weigh yourself each morning at the same time. Weigh yourself without shoes and with the same amount of clothes each day. Compare today's weight to yesterday's weight. Bring this form with you to your follow-up appointments. Call your caregiver if you gain 03 lb/1.4 kg in 1 day. Call your caregiver if you gain 05 lb/2.3 kg in a week. Date: ________ Weight: ____________________ Date: ________ Weight: ____________________ Date: ________ Weight: ____________________ Date: ________ Weight: ____________________ Date: ________ Weight: ____________________ Date: ________ Weight: ____________________ Date: ________ Weight: ____________________ Date: ________ Weight: ____________________ Date: ________ Weight: ____________________ Date: ________ Weight: ____________________ Date: ________ Weight: ____________________ Date: ________ Weight: ____________________ Date: ________ Weight: ____________________ Date: ________ Weight: ____________________ Date: ________ Weight: ____________________ Date: ________ Weight: ____________________ Date: ________ Weight:  ____________________ Date: ________ Weight: ____________________ Date: ________ Weight: ____________________ Date: ________ Weight: ____________________ Date: ________ Weight: ____________________ Date: ________ Weight: ____________________ Date: ________ Weight: ____________________ Date: ________ Weight: ____________________ Date: ________ Weight: ____________________ Date: ________ Weight: ____________________ Date: ________ Weight: ____________________ Date: ________ Weight: ____________________ Date: ________ Weight: ____________________ Date: ________ Weight: ____________________ Date: ________ Weight: ____________________ Date: ________ Weight: ____________________ Date: ________ Weight: ____________________ Date: ________ Weight: ____________________ Date: ________ Weight: ____________________ Date: ________ Weight: ____________________ Date: ________ Weight: ____________________ Date: ________ Weight: ____________________ Date: ________ Weight: ____________________ Date: ________ Weight: ____________________ Date: ________ Weight: ____________________ Date: ________ Weight: ____________________ Date: ________ Weight: ____________________ Date: ________ Weight: ____________________ Date: ________ Weight: ____________________ Date: ________ Weight: ____________________ Date: ________ Weight: ____________________ Date: ________ Weight: ____________________ Date: ________ Weight: ____________________ Date: ________ Weight: ____________________ Document Released: 03/13/2006 Document Revised: 03/24/2011 Document Reviewed: 12/18/2006 [employer] Patient Information 2015 [employer]. This information is not intended to replace advice given to you by your health care provider. Make sure you discuss any questions you have with your health care provider.

## 2013-12-26 NOTE — Progress Notes (Signed)
Patient ID: [name], male   DOB: 02/06/1955, 58 y.o.   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 He is back to work. He is the single parent of 5 children ages 7-13 years.   [name] has developed worsening exertional dyspnea, NYHA functional class II and has intermittent problems with orthopnea. The symptoms seem to have begun about 2 weeks ago when he had a lot of problems with abdominal cramping. He did not have diarrhea or hematochezia but had a lot of "gas". Subsequent to that he has noticed worsening dyspnea and gradually worsening orthopnea, although he has not had lower extremity edema or any major weight changes. He has been compliant with his medications. He is not currently taking a diuretic. He has not had angina pectoris, syncope or presyncope or palpitations.  Multiple events related to CAD this year: - CAD S/P CABG in '06 (SVG-LAD).  - January 2015 acute MI, proximal circumflex artery drug-eluting 2.5x16 mm Xience stent at [facility] - May 2015 he went to his PCP for shoulder pain and his Troponin was elevated. He had stenosis in the SVG-LAD and a DES placed on 06/02/13 at [facility]. He was discharge on Plavix.  - 06/09/13 he was found unresponsive at home by his son. CPR was started and EMS called. He was noted to be in VF. He was transported to [facility]. He had shock, respiratory failure, shock liver, and acute renal insufficiency. Cath revealed widely patent saphenous vein graft to the mid LAD (stented with no evidence of thrombus or restenosis). Widely patent proximal to mid circumflex sten, with haziness in the circumflex proximal to the stent but no significant obstruction. Mild to moderate distal left main stenosis (30-40%) with 80-90% ostial LAD, threatening  a small to moderate diagonal territory in the distribution of the wall motion abnormality. The mid LAD off the retrograde segment from the graft insertion site was occluded. Widely patent RCA. A Myoview showed no ischemia. Echo showed left ventricular ejection fraction of 10-15%. His P2Y12 was elevated and he was taken off Plavix and put on Brilinita. Discharged 06/20/13 with a Life Vest.  - 09/27/13 ECHO showed remarkable improvement with LVEF of 45-50%. Wall motion abnormalities were seen in the proximal LAD artery territory as well as the apical septum.  He also has extensive peripheral arterial disease. Occluded left common carotid and moderate right internal carotid artery stenosis. Previous stent the left subclavian artery. Right iliac stent. Bilateral 60-99% renal artery stenosis  No Known Allergies  Current Outpatient Prescriptions  Medication Sig Dispense Refill  . ALPRAZolam (XANAX) 0.5 MG tablet Take 0.5 mg by mouth See admin instructions. Take 1 tablet by mouth twice daily, allow for additional tablet as needed    . aspirin EC 81 MG tablet Take 81 mg by mouth daily.    Marland Kitchen buPROPion (WELLBUTRIN XL) 300 MG 24 hr tablet Take 300 mg by mouth daily.    . carvedilol (COREG) 12.5 MG tablet Take 1 tablet (12.5 mg total) by mouth 2 (two) times daily. 60 tablet 9  . lisinopril (PRINIVIL,ZESTRIL) 10 MG tablet Take 10mg  in the AM 180 tablet 3  . Multiple Vitamin (MULTIVITAMIN WITH MINERALS) TABS tablet Take 1 tablet by mouth daily.    . nitroGLYCERIN (NITROSTAT) 0.4  MG SL tablet Place 0.4 mg under the tongue every 5 (five) minutes as needed for chest pain.    . pantoprazole (PROTONIX) 40 MG tablet TAKE 1 TABLET BY MOUTH EVERY DAY 90 tablet 0  . thiamine 100 MG tablet Take 1 tablet (100 mg total) by mouth daily. 30 tablet 2  . ticagrelor (BRILINTA) 90 MG TABS tablet Take 1 tablet (90 mg total) by mouth 2 (two) times daily. 60 tablet 5  . potassium chloride SA (K-DUR,KLOR-CON) 20 MEQ tablet Take 1  tablet (20 mEq total) by mouth daily. 90 tablet 3   No current facility-administered medications for this visit.    Past Medical History  Diagnosis Date  . Carotid stenosis   . Coronary artery disease     s/p CABG 2006; stenting to vein graft 05-2013; NSTEMI 06-2013 in setting of PEA arrest  . Subclavian artery stenosis, left     s/p stenting 2006  . Hypertension   . Hyperlipidemia   . Ischemic cardiomyopathy     echo 06/2013 EF 10-15% (normal 01/2013)  . At risk for sudden cardiac death, has lifevest at discharge 06/20/2013  . Shock liver 06/20/2013  . Acute MI, troponin > 20, no obvious culprit vessel 06/20/2013  . Hypothermia, induced, post arrest 06/20/2013  . Acute encephalopathy, improved 06/20/2013  . Dizziness 06/20/2013  . Weakness due to cardiac arrest 06/20/2013  . Fever, possible aspiration-treated with antibiotics 06/20/2013  . Tobacco abuse     >100 pack year history   . Dyslipidemia 10/09/2013    [place] Study atorvastatin -eIRB # [id number] tablet Take 40 mg by mouth daily.   Marland Kitchen PAD (peripheral artery disease) 10/09/2013    Occluded left common carotid and moderate right internal carotid artery stenosis. Previous stent to the left subclavian artery. Right iliac stent. Bilateral 60-99% renal artery stenosis    . Heart failure, acute on chronic, systolic and diastolic 82/70/7867    Past Surgical History  Procedure Laterality Date  . Angioplasty  05/2013    stent to SVG - LAD vein graft at [facility]  . Coronary artery bypass graft  09/2004    SVG to LAD (at [facility])  . Nm myocar perf wall motion  06/2008    persantine myoview - normal pattern of systolic thickening/wall motion/perfusion;  . Lower extremity arterial doppler  08/2008    right and left ABI - mild arterial insufficiency at rest; R CIA/stent with 50-69% narrowing, L CIA with increased velocities (50-69% diameter reduction)  . Renal artery doppler  05/2008    right and left prox renal arteries with narrowing and increased velocities (60-99%  diameter reduction)  . Iliac artery stent  07/24/2008    8x6 Smart nitinol self-expanding stent to origin of ilaci down into external iliac crossing the hypogastric artery (Dr. Adora Fridge)  . Subclavian angiogram Left 08/20/2004    8x53mm Genesis balloon mounted stent  . Left heart catheterization with coronary/graft angiogram N/A 06/15/2013    Procedure: LEFT HEART CATHETERIZATION WITH Beatrix Fetters;  Surgeon: Sinclair Grooms, MD;  Location: [facility] C[facility] LAB;  Service: Cardiovascular;  Laterality: N/A;    Family History  Problem Relation Age of Onset  . Heart disease Maternal Aunt   . Heart attack Mother   . Stroke Mother   . Cancer Father   . Stroke Sister   . Heart Problems Sister     History   Social History  . Marital Status: Married    Spouse Name: N/A  Number of Children: 32  . Years of Education: 10   Occupational History  . carpenter    Social History Main Topics  . Smoking status: Current Every Day Smoker -- 4.00 packs/day for 45 years    Types: Cigarettes  . Smokeless tobacco: Not on file  . Alcohol Use: Not on file  . Drug Use: Not on file  . Sexual Activity: Not on file   Other Topics Concern  . Not on file   Social History Narrative    Review of systems: Bilateral intermittent claudication roughly 300 feet The patient specifically denies any chest pain at rest or with exertion, syncope, palpitations, focal neurological deficits, lower extremity edema, unexplained weight gain, cough, hemoptysis or wheezing.  The patient also denies abdominal pain, nausea, vomiting, dysphagia, diarrhea, constipation, polyuria, polydipsia, dysuria, hematuria, frequency, urgency, abnormal bleeding or bruising, fever, chills, unexpected weight changes, mood swings, change in skin or hair texture, change in voice quality, auditory or visual problems, allergic reactions or rashes, new musculoskeletal complaints other than usual "aches and pains".   PHYSICAL EXAM BP 112/62  mmHg  Pulse 85  Ht 5\' 7"  (1.702 m)  Wt 181 lb 14.4 oz (82.509 kg)  BMI 28.48 kg/m2 General: Alert, oriented x3, no distress, chronic hoarseness Head: no evidence of trauma, PERRL, EOMI, no exophtalmos or lid lag, no myxedema, no xanthelasma; normal ears, nose and oropharynx Neck: minimal elevation in jugular venous pulsations and no hepatojugular reflux; brisk carotid pulses without delay and no carotid bruits Chest: clear to auscultation, no signs of consolidation by percussion or palpation, normal fremitus, symmetrical and full respiratory excursions Cardiovascular: normal position and quality of the apical impulse, regular rhythm, normal first and second heart sounds, no murmurs, rubs or gallops Abdomen: no tenderness or distention, no masses by palpation, no abnormal pulsatility or arterial bruits, normal bowel sounds, no hepatosplenomegaly Extremities: no clubbing, cyanosis or edema; 2+ radial, ulnar and brachial pulses bilaterally; 2+ right femoral, weak posterior tibial and dorsalis pedis pulses; 2+ left femoral, weak posterior tibial and dorsalis pedis pulses; no subclavian or femoral bruits Neurological: grossly nonfocal  EKG: Normal sinus rhythm leads V1 and V2, no acute repolarization abnormalities, QTC borderline 459 ms  BMET    Component Value Date/Time   NA 140 08/09/2013 1214   K 4.6 08/09/2013 1214   CL 105 08/09/2013 1214   CO2 24 08/09/2013 1214   GLUCOSE 103* 08/09/2013 1214   BUN 13 08/09/2013 1214   CREATININE 1.16 08/09/2013 1214   CREATININE 1.04 06/20/2013 1050   CALCIUM 9.7 08/09/2013 1214   GFRNONAA 76* 06/20/2013 1050   GFRAA 88* 06/20/2013 1050     ASSESSMENT AND PLAN  Acute on chronic systolic and diastolic heart failure Despite no major change in his weight or clear evidence of hypervolemia he has symptoms of left heart failure. Will add furosemide 40 mg daily and a potassium supplement. We'll try to inch up his ACE inhibitor dose to 15 mg daily, 20  mg daily in a couple weeks if his blood pressure tolerates it. Asked him to start weighing himself daily and to keep a written record. Reinforced importance of sodium restriction, with which she reports compliance.  Ischemic cardiomyopathy, EF 45% Remarkable improvement in left ventricular systolic function following presentation with VF arrest and cardiogenic shock in the setting of non-ST segment elevation myocardial infarction with troponin greater than 20, likely transient thrombosis with reperfusion of this freshly placed stent. Does not require ICD implantation.  CAD-hx of CABG '  06, DES LCX 01/2013, DES SVG-LAD 5/21/5 at [facility] to be clopidogrel nonresponsive. Status post drug-eluting stent in May 2015. Keep on Brilinta for at least another 8 months. Consider lifelong dual antiplatelet therapy. He has stopped smoking. Heart failure exacerbation could be a sign of in-stent restenosis, roughly 7 months following his last procedures to the SVG-LAD and left circumflex. Check myocardial perfusion study  Dyslipidemia "Rosaura Carpenter, [place] - 09/13/2013 8:36 AM EDT  Mr. Dorko is here for [employer] ([place]) study. He is not taking IP. He is still taking 20 mg of atorvastatin sponsor provided. This visit was completed as per protocol. He left the clinic in stable condition". Probably due for repeat lipid profile.  PAD (peripheral artery disease) Occluded left common carotid and moderate right internal carotid artery stenosis. Previous stent to the left subclavian artery. Right iliac stent. Bilateral 60-99% renal artery stenosis. He has bilateral intermittent claudication. Recheck lower extremity arterial Dopplers showed stable findings of widely patent right iliac stent, 50-69 percent stenosis left common iliac artery, less than 50% left superficial femoral artery stenosis, bilateral ABI 1.0.  Orders Placed This Encounter  Procedures  . Basic metabolic panel  . Myocardial Perfusion Imaging  .  EKG 12-Lead   Meds ordered this encounter  Medications  . furosemide (LASIX) 40 MG tablet    Sig: Take 1 tablet (40 mg total) by mouth daily.    Dispense:  90 tablet    Refill:  3  . potassium chloride SA (K-DUR,KLOR-CON) 20 MEQ tablet    Sig: Take 1 tablet (20 mEq total) by mouth daily.    Dispense:  90 tablet    Refill:  3  . lisinopril (PRINIVIL,ZESTRIL) 10 MG tablet    Sig: Take 10mg  in the AM and 5mg  in the PM x 2 weeks then 10mg  twice daily.    Dispense:  180 tablet    Refill:  Porter Lotus Santillo, MD, [facility] [phone] office [phone] pager

## 2014-01-04 ENCOUNTER — Telehealth (HOSPITAL_COMMUNITY): Payer: Self-pay

## 2014-01-04 NOTE — Telephone Encounter (Signed)
Encounter complete. 

## 2014-01-09 ENCOUNTER — Telehealth: Payer: Self-pay | Admitting: Cardiovascular Disease

## 2014-01-09 MED ORDER — FUROSEMIDE 40 MG PO TABS: ORAL_TABLET | ORAL | Status: DC

## 2014-01-09 NOTE — Telephone Encounter (Signed)
Returned call to patient's daughter Amy.She stated for the past 2 weeks father has had swelling in throat,sob.Stated he cannot lay down at night.Non productive cough.Stated he is taking lasix 40 mg daily.Stated lasix not helping.Stated he is not weighing daily, but has not gained any weight.Dr.Croitoru out of office will send message to him for advice.

## 2014-01-09 NOTE — Telephone Encounter (Signed)
Returned call to patient's daughter Amy.Dr.Croitoru advised to increase lasix to 80 mg am and 40 mg pm.Advised schedule appointment asap.Appointment scheduled with Cecilie Kicks NP 01/11/14 at 11:00 am.

## 2014-01-09 NOTE — Telephone Encounter (Signed)
Amy called in stating that pt was put on a new fluid pill and he says that it not helping and would like to find another medication to take. Please call  Thanks

## 2014-01-09 NOTE — Telephone Encounter (Signed)
Please increase lasix to 80 mg in Am and 40 mg in PM and make f/u appointment ASAP with me or extender

## 2014-01-11 ENCOUNTER — Encounter: Payer: Self-pay | Admitting: Cardiology

## 2014-01-11 ENCOUNTER — Ambulatory Visit (HOSPITAL_COMMUNITY)
Admission: RE | Admit: 2014-01-11 | Discharge: 2014-01-11 | Disposition: A | Discharge status: Home / Self Care | Payer: Medicaid Other | Source: Ambulatory Visit | Attending: Cardiology | Admitting: Cardiology

## 2014-01-11 ENCOUNTER — Ambulatory Visit (INDEPENDENT_AMBULATORY_CARE_PROVIDER_SITE_OTHER): Payer: Medicaid Other | Admitting: Cardiology

## 2014-01-11 VITALS — BP 115/59 | HR 67 | Ht 67.0 in | Wt 178.5 lb

## 2014-01-11 DIAGNOSIS — I5043 Acute on chronic combined systolic (congestive) and diastolic (congestive) heart failure: Secondary | ICD-10-CM

## 2014-01-11 DIAGNOSIS — R042 Hemoptysis: Secondary | ICD-10-CM

## 2014-01-11 DIAGNOSIS — E785 Hyperlipidemia, unspecified: Secondary | ICD-10-CM | POA: Insufficient documentation

## 2014-01-11 DIAGNOSIS — R059 Cough, unspecified: Secondary | ICD-10-CM

## 2014-01-11 DIAGNOSIS — E663 Overweight: Secondary | ICD-10-CM | POA: Insufficient documentation

## 2014-01-11 DIAGNOSIS — Z8249 Family history of ischemic heart disease and other diseases of the circulatory system: Secondary | ICD-10-CM | POA: Insufficient documentation

## 2014-01-11 DIAGNOSIS — R0602 Shortness of breath: Secondary | ICD-10-CM | POA: Insufficient documentation

## 2014-01-11 DIAGNOSIS — I255 Ischemic cardiomyopathy: Secondary | ICD-10-CM

## 2014-01-11 DIAGNOSIS — I10 Essential (primary) hypertension: Secondary | ICD-10-CM | POA: Insufficient documentation

## 2014-01-11 DIAGNOSIS — I2510 Atherosclerotic heart disease of native coronary artery without angina pectoris: Secondary | ICD-10-CM | POA: Insufficient documentation

## 2014-01-11 DIAGNOSIS — Z87891 Personal history of nicotine dependence: Secondary | ICD-10-CM | POA: Insufficient documentation

## 2014-01-11 DIAGNOSIS — R05 Cough: Secondary | ICD-10-CM

## 2014-01-11 LAB — BASIC METABOLIC PANEL
BUN: 20 mg/dL (ref 6–23)
CHLORIDE: 95 meq/L — AB (ref 96–112)
CO2: 29 meq/L (ref 19–32)
CREATININE: 1.29 mg/dL (ref 0.50–1.35)
Calcium: 9.4 mg/dL (ref 8.4–10.5)
GLUCOSE: 98 mg/dL (ref 70–99)
POTASSIUM: 4.4 meq/L (ref 3.5–5.3)
Sodium: 133 mEq/L — ABNORMAL LOW (ref 135–145)

## 2014-01-11 MED ORDER — TECHNETIUM TC 99M SESTAMIBI GENERIC - CARDIOLITE
10.1000 | Freq: Once | INTRAVENOUS | Status: AC | PRN
  Administered 2014-01-11: 10.1 via INTRAVENOUS

## 2014-01-11 MED ORDER — AMINOPHYLLINE 25 MG/ML IV SOLN
100.0000 mg | Freq: Once | INTRAVENOUS | Status: AC
Start: 2014-01-11 — End: 2014-01-11
  Administered 2014-01-11: 100 mg via INTRAVENOUS

## 2014-01-11 MED ORDER — TECHNETIUM TC 99M SESTAMIBI GENERIC - CARDIOLITE
32.1000 | Freq: Once | INTRAVENOUS | Status: AC | PRN
  Administered 2014-01-11: 32.1 via INTRAVENOUS

## 2014-01-11 MED ORDER — REGADENOSON 0.4 MG/5ML IV SOLN
0.4000 mg | Freq: Once | INTRAVENOUS | Status: AC
  Administered 2014-01-11: 0.4 mg via INTRAVENOUS

## 2014-01-11 NOTE — Progress Notes (Signed)
01/11/2014   PCP: Imelda Pillow, NP   Chief Complaint  Patient presents with  . Follow-up    swelling & SOB - stress test today - low BP and dizzy during test - improvements in edema and SOB  . Medication Problem    has been taking lasix 80mg  AM BUT NOT 40mg  PM (was keeping him up at night) - this increased dose is r/t triage call (SOB)    Primary Cardiologist:Dr. Jerilynn Mages Croitoru   HPI:  Patient called in with increasing edema and shortness of breath and asked to be seen. Prior to this visit Dr. Jerilynn Mages. Croitoru had added diuretic which has helped somewhat.  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 He developed worsening exertional dyspnea, NYHA functional class II and has intermittent problems with orthopnea. The symptoms seem to have begun about 4 weeks ago when he had a lot of problems with abdominal cramping. He did not have diarrhea or hematochezia but had a lot of "gas". Subsequent to that he has noticed worsening dyspnea and gradually worsening orthopnea, he now has had lower extremity edema but no major weight changes. He has been compliant with his medications. He is now taking a diuretic-lasix started on the 14 th at 40 mg daily, now increased to 80 in the AM and 40 in the pm.    He has not had angina pectoris, syncope or presyncope or palpitations   He called back on the 28th with increased SOB, and a choking in his throat. + thick yellow mucus with cough and some blood with the mucus.  His diuretic was increased.  He has not taken the higher dose in the evenings as this causes him to be up all night.  The choking is improved now that he is getting mucus up.  He has no trouble swallowing.  He no longer smokes.  He is able to rest better with increased dose of lasix.    He had nuc study today "Low risk stress nuclear study There is  increased gut uptake on both images which probably does  not  affect interpretation.  LV Wall Motion: EF 45 % with discordant contraction pattern. "    labs today sodium 133 potassium 4.4 chloride 95 CO2 29 glucose 98 Bering 20 creatinine 1.29  No Known Allergies  Current Outpatient Prescriptions  Medication Sig Dispense Refill  . ALPRAZolam (XANAX) 0.5 MG tablet Take 0.5 mg by mouth See admin instructions. Take 1 tablet by mouth twice daily, allow for additional tablet as needed    . aspirin EC 81 MG tablet Take 81 mg by mouth daily.    Marland Kitchen buPROPion (WELLBUTRIN XL) 300 MG 24 hr tablet Take 300 mg by mouth daily.    . carvedilol (COREG) 12.5 MG tablet Take 1 tablet (12.5 mg total) by mouth 2 (two) times daily. 60 tablet 9  . furosemide (LASIX) 40 MG tablet Take 80 mg am ( 2 tablets ) 40 mg pm ( 1 tablet ) 60 tablet 6  . lisinopril (PRINIVIL,ZESTRIL) 10 MG tablet Take 10mg  in the AM and 5mg  in the PM x 2 weeks then 10mg  twice daily. 180 tablet 3  . Multiple Vitamin (MULTIVITAMIN WITH MINERALS) TABS tablet Take 1 tablet by mouth daily.    . nitroGLYCERIN (NITROSTAT) 0.4 MG SL tablet Place 0.4 mg under the tongue every 5 (five)  minutes as needed for chest pain.    . pantoprazole (PROTONIX) 40 MG tablet TAKE 1 TABLET BY MOUTH EVERY DAY 90 tablet 0  . potassium chloride SA (K-DUR,KLOR-CON) 20 MEQ tablet Take 1 tablet (20 mEq total) by mouth daily. 90 tablet 3  . thiamine 100 MG tablet Take 1 tablet (100 mg total) by mouth daily. 30 tablet 2  . ticagrelor (BRILINTA) 90 MG TABS tablet Take 1 tablet (90 mg total) by mouth 2 (two) times daily. 60 tablet 5   No current facility-administered medications for this visit.    Past Medical History  Diagnosis Date  . Carotid stenosis   . Coronary artery disease     s/p CABG 2006; stenting to vein graft 05-2013; NSTEMI 06-2013 in setting of PEA arrest  . Subclavian artery stenosis, left     s/p stenting 2006  . Hypertension   . Hyperlipidemia   . Ischemic cardiomyopathy     echo 06/2013 EF 10-15% (normal  01/2013)  . At risk for sudden cardiac death, has lifevest at discharge 06/20/2013  . Shock liver 06/20/2013  . Acute MI, troponin > 20, no obvious culprit vessel 06/20/2013  . Hypothermia, induced, post arrest 06/20/2013  . Acute encephalopathy, improved 06/20/2013  . Dizziness 06/20/2013  . Weakness due to cardiac arrest 06/20/2013  . Fever, possible aspiration-treated with antibiotics 06/20/2013  . Tobacco abuse     >100 pack year history   . Dyslipidemia 10/09/2013    [place] Study atorvastatin -eIRB # [id number] tablet Take 40 mg by mouth daily.   Marland Kitchen PAD (peripheral artery disease) 10/09/2013    Occluded left common carotid and moderate right internal carotid artery stenosis. Previous stent to the left subclavian artery. Right iliac stent. Bilateral 60-99% renal artery stenosis    . Heart failure, acute on chronic, systolic and diastolic 14/97/0263    Past Surgical History  Procedure Laterality Date  . Angioplasty  05/2013    stent to SVG - LAD vein graft at [facility]  . Coronary artery bypass graft  09/2004    SVG to LAD (at [facility])  . Nm myocar perf wall motion  06/2008    persantine myoview - normal pattern of systolic thickening/wall motion/perfusion;  . Lower extremity arterial doppler  08/2008    right and left ABI - mild arterial insufficiency at rest; R CIA/stent with 50-69% narrowing, L CIA with increased velocities (50-69% diameter reduction)  . Renal artery doppler  05/2008    right and left prox renal arteries with narrowing and increased velocities (60-99% diameter reduction)  . Iliac artery stent  07/24/2008    8x6 Smart nitinol self-expanding stent to origin of ilaci down into external iliac crossing the hypogastric artery (Dr. Adora Fridge)  . Subclavian angiogram Left 08/20/2004    8x18mm Genesis balloon mounted stent  . Left heart catheterization with coronary/graft angiogram N/A 06/15/2013    Procedure: LEFT HEART CATHETERIZATION WITH Beatrix Fetters;  Surgeon: Sinclair Grooms, MD;  Location: [facility]  CATH LAB;  Service: Cardiovascular;  Laterality: N/A;    ZCH:YIFOYDX:AJOINOMV colds no fevers, down 3 lbs Skin:no rashes or ulcers HEENT:no blurred vision, no congestion CV:see HPI PUL:see HPI GI:no diarrhea constipation or melena, no indigestion GU:no hematuria, no dysuria MS:no joint pain, no claudication Neuro:no syncope, no lightheadedness Endo:no diabetes, no thyroid disease  Wt Readings from Last 3 Encounters:  01/11/14 178 lb 8 oz (80.967 kg)  01/11/14 181 lb (82.101 kg)  12/26/13 181 lb 14.4 oz (82.509 kg)  PHYSICAL EXAM BP 115/59 mmHg  Pulse 67  Ht 5\' 7"  (1.702 m)  Wt 178 lb 8 oz (80.967 kg)  BMI 27.95 kg/m2 General:Pleasant affect, NAD Skin:Warm and dry, brisk capillary refill HEENT:normocephalic, sclera clear, mucus membranes moist Neck:supple, no JVD, no bruits  Heart:S1S2 RRR without murmur, gallup, rub or click Lungs:clear without rales, rhonchi, or wheezes ZOX:WRUE, non tender, + BS, do not palpate liver spleen or masses Ext:no lower ext edema, 2+ pedal pulses, 2+ radial pulses Neuro:alert and oriented, MAE, follows commands, + facial symmetry   ASSESSMENT AND PLAN Ischemic cardiomyopathy, EF 45% On today's nuc study negative for ischemia and EF stable.   Heart failure, acute on chronic, systolic and diastolic Will continue his lasix 80 mg in the morning and 40 mg around 2 pm through Saturday, then will go to 80 mg daily. If symptoms increase she will call us. Additionally I'm getting a two-view chest x-ray to rule out pneumonia and evaluate his heart failure.   he will keep his follow-up appointment with Dr. Jerilynn Mages. Croitoru for January 29 though if further problems we will see him back earlier. Actually I'll have him seen by myself or another APP after I get the chest x-ray results back.

## 2014-01-11 NOTE — Assessment & Plan Note (Signed)
Will continue his lasix 80 mg in the morning and 40 mg around 2 pm through Saturday, then will go to 80 mg daily. If symptoms increase she will call us. Additionally I'm getting a two-view chest x-ray to rule out pneumonia and evaluate his heart failure.

## 2014-01-11 NOTE — Procedures (Addendum)
[facility] [address] [place] [address] [phone]  Cardiology Nuclear Med Study  [name] is a 58 y.o. male     MRN : [id number]     DOB: May 13, 1955  Procedure Date: 01/11/2014  Nuclear Med Background Indication for Stress Test:  Evaluation for Ischemia and Follow up CAD History:  CAD;Last NUC MPI on 06/16/2013-scar;EF=33%;Ischemic cardiomyopathy;Cardiac Arrest;CABG-2006-x1;PTCA-x4;MI-05/2013 Cardiac Risk Factors: Family History - CAD, History of Smoking, Hypertension, Lipids and Overweight  Symptoms:  DOE, SOB   Nuclear Pre-Procedure Caffeine/Decaff Intake:  9:00pm NPO After: 7:00am   IV Site: R Forearm  IV 0.9% NS with Angio Cath:  22g  Chest Size (in):  48"  IV Started by: Rolene Course, RN  Height: 5\' 7"  (1.702 m)  Cup Size: n/a  BMI:  Body mass index is 28.34 kg/(m^2). Weight:  181 lb (82.101 kg)   Tech Comments:  n/a    Nuclear Med Study 1 or 2 day study: 1 day  Stress Test Type:  [place] Provider:  Sanda Klein, MD   Resting Radionuclide: Technetium 54m Sestamibi  Resting Radionuclide Dose: 10.1 mCi   Stress Radionuclide:  Technetium 23m Sestamibi  Stress Radionuclide Dose: 32.1 mCi          Stress Protocol Rest HR: 76 Stress HR: 97  Rest BP: 123/71 Stress BP: 137/53  Exercise Time (min): n/a METS: n/a          Dose of Adenosine (mg):  n/a Dose of Lexiscan: 0.4 mg  Dose of Atropine (mg): n/a Dose of Dobutamine: n/a mcg/kg/min (at max HR)  Stress Test Technologist: Mellody Memos, CCT Nuclear Technologist: Imagene Riches, CNMT   Rest Procedure:  Myocardial perfusion imaging was performed at rest 45 minutes following the intravenous administration of Technetium 67m Sestamibi. Stress Procedure:  The patient received IV Lexiscan 0.4 mg over 15-seconds.  Technetium 92m Sestamibi injected IV at 30-seconds.  Patient experienced shortness of breath, dizziness, a drop in  blood pressure and was administered 100 mg of Aminophylline IV. There were no significant changes with Lexiscan.  Quantitative spect images were obtained after a 45 minute delay.  Transient Ischemic Dilatation (Normal <1.22):  1.33  QGS EDV:  80 ml QGS ESV:  44 ml LV Ejection Fraction: 45%        Rest ECG: NSR - Normal EKG  Stress ECG: No significant change from baseline ECG  QPS Raw Data Images:  There is interference from nuclear activity from structures below the diaphragm. This does not affect the ability to read the study. Stress Images:  Normal homogeneous uptake in all areas of the myocardium. Rest Images:  Normal homogeneous uptake in all areas of the myocardium. Subtraction (SDS):  No evidence of ischemia.  Impression Exercise Capacity:  Lexiscan with no exercise. BP Response:  Normal blood pressure response. Clinical Symptoms:  No significant symptoms noted. ECG Impression:  No significant ST segment change suggestive of ischemia. Comparison with Prior Nuclear Study: No evidence of scar on this study  Overall Impression:  Low risk stress nuclear study There is increased gut uptake on both images which probably does not affect interpretation.  LV Wall Motion:  EF 45 % with discordant contraction pattern   Lorretta Harp, MD  01/11/2014 3:40 PM

## 2014-01-11 NOTE — Patient Instructions (Signed)
A chest x-ray takes a picture of the organs and structures inside the chest, including the heart, lungs, and blood vessels. This test can show several things, including, whether the heart is enlarges; whether fluid is building up in the lungs; and whether pacemaker / defibrillator leads are still in place. >> this is done at [address] >> you do NOT need an appointment for this  Furosemide (Lasix)  >> Take 80mg  in the morning and 40mg  2-3pm now until Saturday.  >> Starting Sunday January 3rd - take 80mg  once daily   Your lab work looked OK.   Please keep you follow up with Dr. Sallyanne Kuster

## 2014-01-11 NOTE — Assessment & Plan Note (Signed)
On today's nuc study negative for ischemia and EF stable.

## 2014-01-12 ENCOUNTER — Ambulatory Visit
Admission: RE | Admit: 2014-01-12 | Discharge: 2014-01-12 | Disposition: A | Discharge status: Home / Self Care | Payer: Medicaid Other | Source: Ambulatory Visit | Attending: Cardiology | Admitting: Cardiology

## 2014-01-12 DIAGNOSIS — R042 Hemoptysis: Secondary | ICD-10-CM

## 2014-01-12 DIAGNOSIS — R059 Cough, unspecified: Secondary | ICD-10-CM

## 2014-01-12 DIAGNOSIS — R05 Cough: Secondary | ICD-10-CM

## 2014-01-16 ENCOUNTER — Ambulatory Visit: Payer: Medicaid Other | Admitting: Physician Assistant

## 2014-01-16 ENCOUNTER — Telehealth: Payer: Self-pay | Admitting: Cardiovascular Disease

## 2014-01-16 NOTE — Telephone Encounter (Signed)
Spoke with patient - informed him his daughter is not listed as emergency contact or as DPR. Instructed him to fill our DPR form at his next appmt.   Informed him his CXR was normal. He states he is feeling better - is still tried and wore out. Has been taking his medications as prescribed.   He voiced understanding of his results - he will pass along to daughter.

## 2014-01-16 NOTE — Telephone Encounter (Signed)
She would like his imaging results from last week please.

## 2014-01-16 NOTE — Telephone Encounter (Signed)
Thanks, if continues to feel bad needs to be seen back, though I believe he has appt with Dr. Jerilynn Mages. Croitoru

## 2014-01-16 NOTE — Telephone Encounter (Signed)
Appointment 02/10/14 with Dr. Loletha Grayer

## 2014-02-10 ENCOUNTER — Ambulatory Visit: Payer: Medicaid Other | Admitting: Cardiovascular Disease

## 2014-03-13 ENCOUNTER — Telehealth: Payer: Self-pay | Admitting: *Deleted

## 2014-03-13 ENCOUNTER — Ambulatory Visit (INDEPENDENT_AMBULATORY_CARE_PROVIDER_SITE_OTHER): Payer: Medicaid Other | Admitting: Cardiovascular Disease

## 2014-03-13 VITALS — BP 134/72 | HR 88 | Resp 16 | Ht 67.0 in | Wt 179.3 lb

## 2014-03-13 DIAGNOSIS — I255 Ischemic cardiomyopathy: Secondary | ICD-10-CM

## 2014-03-13 DIAGNOSIS — I472 Ventricular tachycardia: Secondary | ICD-10-CM

## 2014-03-13 DIAGNOSIS — I25810 Atherosclerosis of coronary artery bypass graft(s) without angina pectoris: Secondary | ICD-10-CM

## 2014-03-13 DIAGNOSIS — I2510 Atherosclerotic heart disease of native coronary artery without angina pectoris: Secondary | ICD-10-CM

## 2014-03-13 DIAGNOSIS — I739 Peripheral vascular disease, unspecified: Secondary | ICD-10-CM

## 2014-03-13 DIAGNOSIS — I4729 Other ventricular tachycardia: Secondary | ICD-10-CM

## 2014-03-13 DIAGNOSIS — I5043 Acute on chronic combined systolic (congestive) and diastolic (congestive) heart failure: Secondary | ICD-10-CM

## 2014-03-13 MED ORDER — LISINOPRIL 10 MG PO TABS: ORAL_TABLET | ORAL | Status: DC

## 2014-03-13 NOTE — Patient Instructions (Signed)
INCREASE Lisinopril to 10mg  in morning and 20mg  in the evening.  Dr. Sallyanne Kuster recommends that you schedule a follow-up appointment in: 3 months.

## 2014-03-13 NOTE — Telephone Encounter (Signed)
States he is paying $170 to over $200 a month for Brilinta.  He has no insurance drug coverage and is using the discount card.  Gave # to call Brilinta to see if he qualifies for any programs they have.  States he is working with [facility] and Medicaid to get help with all his medical bills.

## 2014-03-14 ENCOUNTER — Encounter: Payer: Self-pay | Admitting: Cardiovascular Disease

## 2014-03-14 NOTE — Progress Notes (Signed)
Patient ID: [name], male   DOB: [birth year], 59 y.o.   MRN: [id number]      Cardiology Office Note   Date:  03/14/2014   ID:  [name], DOB April 11, 1955, MRN [id number]  PCP:  Imelda Pillow, NP  Cardiologist:   Sanda Klein, MD   Chief Complaint  Patient presents with  . Follow-up    stress test      History of Present Illness: [name] is a 59 y.o. male who presents for reevaluation after diuretic therapy for acute combined systolic and diastolic heart failure, CAD, ischemic cardiomyopathy, history of cardiac arrest in the setting of myocardial infarction.  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 He has not smoked since he suffered a cardiac arrest on 5/28. He is back to work. He is the single parent of 5 children ages 72-13 years.   After increasing diuretics for worsening dyspnea in late December he improved and is now back to a low dose of furosemide 40 mg daily.   Multiple events related to CAD last year: - CAD S/P CABG in '06 (SVG-LAD).  - January 2015 acute MI, proximal circumflex artery drug-eluting 2.5x16 mm Xience stent at [facility] - May 2015 he went to his PCP for shoulder pain and his Troponin was elevated. He had stenosis in the SVG-LAD and a DES placed on 06/02/13 at [facility]. He was discharged on Plavix.  - 06/09/13 he was found unresponsive at home by his son. CPR was started and EMS called. He was noted to be in VF. He was transported to [facility]. He had shock, respiratory failure, shock liver, and acute renal insufficiency. Cath revealed widely patent saphenous vein graft to the mid LAD (stented with no evidence of thrombus or restenosis). Widely patent proximal to mid circumflex stent, with haziness in the circumflex proximal to the stent but no significant obstruction. Mild to moderate distal left main stenosis (30-40%) with 80-90% ostial LAD, threatening a small to moderate diagonal territory in the distribution of  the wall motion abnormality. The mid LAD off the retrograde segment from the graft insertion site was occluded. Widely patent RCA. A Myoview showed no ischemia. Echo showed left ventricular ejection fraction of 10-15%. His P2Y12 was elevated and he was taken off Plavix and put on Brilinita. Discharged 06/20/13 with a Life Vest.  - 09/27/13 ECHO showed remarkable improvement with LVEF of 45-50%. Wall motion abnormalities were seen in the proximal LAD artery territory as well as the apical septum.  He also has extensive peripheral arterial disease. Occluded left common carotid and moderate right internal carotid artery stenosis. Previous stent the left subclavian artery. Right iliac stent. Bilateral 60-99% renal artery stenosis    Past Medical History  Diagnosis Date  . Carotid stenosis   . Coronary artery disease     s/p CABG 2006; stenting to vein graft 05-2013; NSTEMI 06-2013 in setting of PEA arrest  . Subclavian artery stenosis, left     s/p stenting 2006  . Hypertension   . Hyperlipidemia   . Ischemic cardiomyopathy     echo 06/2013 EF 10-15% (normal 01/2013)  . At risk for sudden cardiac death, has lifevest at discharge 06/20/2013  . Shock liver 06/20/2013  . Acute MI, troponin > 20, no obvious culprit vessel 06/20/2013  . Hypothermia, induced, post arrest 06/20/2013  . Acute encephalopathy, improved 06/20/2013  . Dizziness 06/20/2013  . Weakness due to cardiac arrest 06/20/2013  . Fever, possible  aspiration-treated with antibiotics 06/20/2013  . Tobacco abuse     >100 pack year history   . Dyslipidemia 10/09/2013    [place] Study atorvastatin -eIRB # [id number] tablet Take 40 mg by mouth daily.   Marland Kitchen PAD (peripheral artery disease) 10/09/2013    Occluded left common carotid and moderate right internal carotid artery stenosis. Previous stent to the left subclavian artery. Right iliac stent. Bilateral 60-99% renal artery stenosis    . Heart failure, acute on chronic, systolic and diastolic 94/85/4627    Past  Surgical History  Procedure Laterality Date  . Angioplasty  05/2013    stent to SVG - LAD vein graft at [facility]  . Coronary artery bypass graft  09/2004    SVG to LAD (at [facility]  . Nm myocar perf wall motion  06/2008    persantine myoview - normal pattern of systolic thickening/wall motion/perfusion;  . Lower extremity arterial doppler  08/2008    right and left ABI - mild arterial insufficiency at rest; R CIA/stent with 50-69% narrowing, L CIA with increased velocities (50-69% diameter reduction)  . Renal artery doppler  05/2008    right and left prox renal arteries with narrowing and increased velocities (60-99% diameter reduction)  . Iliac artery stent  07/24/2008    8x6 Smart nitinol self-expanding stent to origin of ilaci down into external iliac crossing the hypogastric artery (Dr. Adora Fridge)  . Subclavian angiogram Left 08/20/2004    8x89mm Genesis balloon mounted stent  . Left heart catheterization with coronary/graft angiogram N/A 06/15/2013    Procedure: LEFT HEART CATHETERIZATION WITH Beatrix Fetters;  Surgeon: Sinclair Grooms, MD;  Location: [facility] CATH LAB;  Service: Cardiovascular;  Laterality: N/A;     Current Outpatient Prescriptions  Medication Sig Dispense Refill  . ALPRAZolam (XANAX) 0.5 MG tablet Take 0.5 mg by mouth See admin instructions. Take 1 tablet by mouth twice daily, allow for additional tablet as needed    . aspirin EC 81 MG tablet Take 81 mg by mouth daily.    Marland Kitchen buPROPion (WELLBUTRIN XL) 300 MG 24 hr tablet Take 300 mg by mouth daily.    . carvedilol (COREG) 12.5 MG tablet Take 1 tablet (12.5 mg total) by mouth 2 (two) times daily. 60 tablet 9  . furosemide (LASIX) 40 MG tablet Take 40 mg by mouth daily.    Marland Kitchen lisinopril (PRINIVIL,ZESTRIL) 10 MG tablet Take 10mg  in the AM and 20mg  in the PM 180 tablet 3  . Multiple Vitamin (MULTIVITAMIN WITH MINERALS) TABS tablet Take 1 tablet by mouth daily.    . nitroGLYCERIN (NITROSTAT) 0.4 MG SL tablet Place 0.4 mg under the  tongue every 5 (five) minutes as needed for chest pain.    . pantoprazole (PROTONIX) 40 MG tablet TAKE 1 TABLET BY MOUTH EVERY DAY 90 tablet 0  . potassium chloride SA (K-DUR,KLOR-CON) 20 MEQ tablet Take 1 tablet (20 mEq total) by mouth daily. 90 tablet 3  . thiamine 100 MG tablet Take 1 tablet (100 mg total) by mouth daily. 30 tablet 2  . ticagrelor (BRILINTA) 90 MG TABS tablet Take 1 tablet (90 mg total) by mouth 2 (two) times daily. 60 tablet 5   No current facility-administered medications for this visit.    Allergies:   Review of patient's allergies indicates no known allergies.    Social History:  The patient  reports that he quit smoking about 9 months ago. His smoking use included Cigarettes. He has a 180 pack-year smoking history. He  has quit using smokeless tobacco. He reports that he drinks about 12.6 - 25.2 oz of alcohol per week. He reports that he does not use illicit drugs.   Family History:  The patient's family history includes Cancer in his father; Heart Problems in his sister; Heart attack in his mother; Heart disease in his maternal aunt; Stroke in his mother and sister.    ROS:  Please see the history of present illness.    The patient specifically denies any chest pain at rest or with light exertion, dyspnea at rest or with exertion, orthopnea, paroxysmal nocturnal dyspnea, syncope, palpitations, focal neurological deficits, intermittent claudication, lower extremity edema, unexplained weight gain, cough, hemoptysis or wheezing. All other systems are reviewed and negative.    PHYSICAL EXAM: VS:  BP 134/72 mmHg  Pulse 88  Resp 16  Ht 5\' 7"  (1.702 m)  Wt 179 lb 4.8 oz (81.33 kg)  BMI 28.08 kg/m2 , BMI Body mass index is 28.08 kg/(m^2). General: Alert, oriented x3, no distress, chronic hoarseness Head: no evidence of trauma, PERRL, EOMI, no exophtalmos or lid lag, no myxedema, no xanthelasma; normal ears, nose and oropharynx Neck: minimal elevation in jugular venous  pulsations and no hepatojugular reflux; brisk carotid pulses without delay and no carotid bruits Chest: clear to auscultation, no signs of consolidation by percussion or palpation, normal fremitus, symmetrical and full respiratory excursions Cardiovascular: normal position and quality of the apical impulse, regular rhythm, normal first and second heart sounds, no murmurs, rubs or gallops Abdomen: no tenderness or distention, no masses by palpation, no abnormal pulsatility or arterial bruits, normal bowel sounds, no hepatosplenomegaly Extremities: no clubbing, cyanosis or edema; 2+ radial, ulnar and brachial pulses bilaterally; 2+ right femoral, weak posterior tibial and dorsalis pedis pulses; 2+ left femoral, weak posterior tibial and dorsalis pedis pulses; no subclavian or femoral bruits Neurological: grossly nonfocal Psych: euthymic mood, full affect   EKG:  EKG is not ordered today.  Recent Labs: 06/12/2013: Pro B Natriuretic peptide (BNP) 2889.0* 06/13/2013: Magnesium 2.2 06/17/2013: Hemoglobin 11.0*; Platelets 372 08/09/2013: ALT 34 01/11/2014: BUN 20; Creatinine 1.29; Potassium 4.4; Sodium 133*    Lipid Panel HE IS ENROLLED IN A LIPID LOWERING TRIAL AT [facility]. CURRENTLY ON ATORVASTATIN 40 MG EVERY OTHER DAY DUE TO MYALGIA    Wt Readings from Last 3 Encounters:  03/13/14 179 lb 4.8 oz (81.33 kg)  01/11/14 178 lb 8 oz (80.967 kg)  12/26/13 181 lb 14.4 oz (82.509 kg)    NUCLEAR STUDY 06/2013: "The scintigraphic images show findings that are consistent with a previous basilar/mid anterior myocardial infarction. There is no evidence of ischemia. The overall left ventricular systolic function is moderately to severely reduced with an ejection fraction of 33%. There is akinesis of the basilar/mid anterior wall." Images reviewed, agree.  ASSESSMENT AND PLAN:  1. Acute on chronic systolic and diastolic heart failure Appears to be euvolemic, NYHA class II. Try to increase lisinopril to  total of 30 mg daily. Reinforced importance of sodium restriction, with which he reports compliance.  2. Ischemic cardiomyopathy, EF 45% Remarkable improvement in left ventricular systolic function following presentation with VF arrest and cardiogenic shock in the setting of non-ST segment elevation myocardial infarction with troponin greater than 20, likely transient thrombosis with reperfusion of this freshly placed stent. Does not require ICD implantation.  3. CAD-hx of CABG '06, DES LCX 01/2013, DES SVG-LAD 5/21/5 at [facility] to be clopidogrel nonresponsive. Status post drug-eluting stent in May 2015. Keep on Brilinta  for at least another 8 months. Consider lifelong dual antiplatelet therapy. Cost of Brilinta is a big problem though - he pays $280 monthly for it, despite a discount coupon. Will try to see if he is eligible for assistance. He has stopped smoking.June 2015 myocardial perfusion study did not show reversible defects.  4. Dyslipidemia "Rosaura Carpenter, [place] - 09/13/2013 8:36 AM EDT  Mr. Sek is here for [employer] ([place]) study. He is not taking IP. He is still taking 20 mg of atorvastatin sponsor provided. This visit was completed as per protocol. He left the clinic in stable condition".   5. PAD (peripheral artery disease) Occluded left common carotid and moderate right internal carotid artery stenosis. Previous stent to the left subclavian artery. Right iliac stent. Bilateral 60-99% renal artery stenosis. He has bilateral intermittent claudication. Recheck lower extremity arterial Dopplers showed stable findings of widely patent right iliac stent, 50-69 percent stenosis left common iliac artery, less than 50% left superficial femoral artery stenosis, bilateral ABI 1.0.   Current medicines are reviewed at length with the patient today.  The patient does not have concerns regarding medicines.   Patient Instructions  INCREASE Lisinopril to 10mg  in morning and 20mg  in the  evening.  Dr. Sallyanne Kuster recommends that you schedule a follow-up appointment in: 3 months.       Mikael Spray, MD  03/14/2014 2:17 PM    [facility] [place], [place], [place]  [address] Phone: [phone]; Fax: [phone]

## 2014-04-16 ENCOUNTER — Other Ambulatory Visit: Payer: Self-pay | Admitting: Cardiology

## 2014-04-17 NOTE — Telephone Encounter (Signed)
Rx(s) sent to pharmacy electronically.  

## 2014-04-30 ENCOUNTER — Other Ambulatory Visit: Payer: Self-pay | Admitting: Cardiovascular Disease

## 2014-05-01 NOTE — Telephone Encounter (Signed)
Rx has been sent to the pharmacy electronically. ° °

## 2014-06-16 ENCOUNTER — Ambulatory Visit: Payer: Self-pay | Admitting: Cardiovascular Disease

## 2014-06-16 ENCOUNTER — Telehealth: Payer: Self-pay | Admitting: Cardiovascular Disease

## 2014-06-16 NOTE — Telephone Encounter (Signed)
Left message for pt to call.

## 2014-06-16 NOTE — Telephone Encounter (Signed)
Pt called in wanting to speak with Dr. Lurline Del nurse . He states that he has been coughing up blood every once in a while and would like to be seen by the doctor sooner than 6/20. Please call  Thanks

## 2014-06-16 NOTE — Telephone Encounter (Signed)
Spoke with pt, he is having a lot of sinus drainage and occ he will cough up blood. He denies fever or any other problems. He has been getting a headache on the left side of his head and last week he had an ear infection. Encouraged patient to try saline nasal spray and claritin to help with the sinus drainage. Patient thinks the blood is related to the sinus problems he is having. He will keep current appt and call if symptoms change or worsen. Pt agreed with this plan.

## 2014-06-22 ENCOUNTER — Encounter (HOSPITAL_COMMUNITY): Payer: Self-pay | Admitting: Cardiology

## 2014-06-22 ENCOUNTER — Emergency Department (HOSPITAL_COMMUNITY): Payer: Self-pay

## 2014-06-22 ENCOUNTER — Emergency Department (HOSPITAL_COMMUNITY)
Admission: EM | Admit: 2014-06-22 | Discharge: 2014-06-22 | Disposition: A | Discharge status: Home / Self Care | Payer: Medicaid Other | Attending: Emergency Medicine | Admitting: Emergency Medicine

## 2014-06-22 ENCOUNTER — Emergency Department (HOSPITAL_COMMUNITY): Payer: Medicaid Other

## 2014-06-22 ENCOUNTER — Telehealth: Payer: Self-pay | Admitting: Cardiovascular Disease

## 2014-06-22 DIAGNOSIS — Z7982 Long term (current) use of aspirin: Secondary | ICD-10-CM | POA: Insufficient documentation

## 2014-06-22 DIAGNOSIS — I5043 Acute on chronic combined systolic (congestive) and diastolic (congestive) heart failure: Secondary | ICD-10-CM | POA: Insufficient documentation

## 2014-06-22 DIAGNOSIS — Z9861 Coronary angioplasty status: Secondary | ICD-10-CM | POA: Insufficient documentation

## 2014-06-22 DIAGNOSIS — I10 Essential (primary) hypertension: Secondary | ICD-10-CM | POA: Insufficient documentation

## 2014-06-22 DIAGNOSIS — Z8639 Personal history of other endocrine, nutritional and metabolic disease: Secondary | ICD-10-CM | POA: Insufficient documentation

## 2014-06-22 DIAGNOSIS — R221 Localized swelling, mass and lump, neck: Secondary | ICD-10-CM | POA: Insufficient documentation

## 2014-06-22 DIAGNOSIS — Z87891 Personal history of nicotine dependence: Secondary | ICD-10-CM | POA: Insufficient documentation

## 2014-06-22 DIAGNOSIS — I252 Old myocardial infarction: Secondary | ICD-10-CM | POA: Insufficient documentation

## 2014-06-22 DIAGNOSIS — Z7902 Long term (current) use of antithrombotics/antiplatelets: Secondary | ICD-10-CM | POA: Insufficient documentation

## 2014-06-22 DIAGNOSIS — Z951 Presence of aortocoronary bypass graft: Secondary | ICD-10-CM | POA: Insufficient documentation

## 2014-06-22 DIAGNOSIS — R042 Hemoptysis: Secondary | ICD-10-CM | POA: Insufficient documentation

## 2014-06-22 DIAGNOSIS — Z9889 Other specified postprocedural states: Secondary | ICD-10-CM | POA: Insufficient documentation

## 2014-06-22 DIAGNOSIS — J392 Other diseases of pharynx: Secondary | ICD-10-CM

## 2014-06-22 DIAGNOSIS — I2510 Atherosclerotic heart disease of native coronary artery without angina pectoris: Secondary | ICD-10-CM | POA: Insufficient documentation

## 2014-06-22 DIAGNOSIS — Z8719 Personal history of other diseases of the digestive system: Secondary | ICD-10-CM | POA: Insufficient documentation

## 2014-06-22 LAB — COMPREHENSIVE METABOLIC PANEL
ALBUMIN: 3.8 g/dL (ref 3.5–5.0)
ALT: 26 U/L (ref 17–63)
AST: 27 U/L (ref 15–41)
Alkaline Phosphatase: 116 U/L (ref 38–126)
Anion gap: 13 (ref 5–15)
BILIRUBIN TOTAL: 0.3 mg/dL (ref 0.3–1.2)
BUN: 18 mg/dL (ref 6–20)
CO2: 25 mmol/L (ref 22–32)
CREATININE: 1.31 mg/dL — AB (ref 0.61–1.24)
Calcium: 8.9 mg/dL (ref 8.9–10.3)
Chloride: 96 mmol/L — ABNORMAL LOW (ref 101–111)
GFR calc Af Amer: >60 mL/min (ref >60)
GFR, EST NON AFRICAN AMERICAN: 57 mL/min — AB (ref >60)
GLUCOSE: 88 mg/dL (ref 65–99)
POTASSIUM: 3.6 mmol/L (ref 3.5–5.1)
Sodium: 134 mmol/L — ABNORMAL LOW (ref 135–145)
Total Protein: 6.9 g/dL (ref 6.5–8.1)

## 2014-06-22 LAB — CBC WITH DIFFERENTIAL/PLATELET
BASOS ABS: 0 10*3/uL (ref 0.0–0.1)
Basophils Relative: 0 % (ref 0–1)
EOS ABS: 0.2 10*3/uL (ref 0.0–0.7)
EOS PCT: 2 % (ref 0–5)
HCT: 33.8 % — ABNORMAL LOW (ref 39.0–52.0)
HEMOGLOBIN: 11.3 g/dL — AB (ref 13.0–17.0)
LYMPHS PCT: 31 % (ref 12–46)
Lymphs Abs: 3.4 10*3/uL (ref 0.7–4.0)
MCH: 30.8 pg (ref 26.0–34.0)
MCHC: 33.4 g/dL (ref 30.0–36.0)
MCV: 92.1 fL (ref 78.0–100.0)
MONO ABS: 0.9 10*3/uL (ref 0.1–1.0)
MONOS PCT: 9 % (ref 3–12)
Neutro Abs: 6.1 10*3/uL (ref 1.7–7.7)
Neutrophils Relative %: 58 % (ref 43–77)
Platelets: 260 10*3/uL (ref 150–400)
RBC: 3.67 MIL/uL — ABNORMAL LOW (ref 4.22–5.81)
RDW: 14 % (ref 11.5–15.5)
WBC: 10.7 10*3/uL — AB (ref 4.0–10.5)

## 2014-06-22 LAB — PROTIME-INR
INR: 0.99 (ref 0.00–1.49)
Prothrombin Time: 13.3 seconds (ref 11.6–15.2)

## 2014-06-22 MED ORDER — IOHEXOL 300 MG/ML  SOLN
80.0000 mL | Freq: Once | INTRAMUSCULAR | Status: AC | PRN
  Administered 2014-06-22: 100 mL via INTRAVENOUS

## 2014-06-22 NOTE — ED Notes (Signed)
Pt is leaving for xray at this current time.

## 2014-06-22 NOTE — Telephone Encounter (Signed)
Pt's daughter called in stating that the pt has been coughing up blood clots since this morning and she would like to know what to do. Please f/u   Thanks

## 2014-06-22 NOTE — ED Notes (Signed)
Pt reports that he has had a sinus and respiratory infection and has been placed on two different antibiotics but it not feeling better. Pt reports that he has felt slightly SOB.

## 2014-06-22 NOTE — ED Provider Notes (Signed)
CSN: [id number]     Arrival date & time 06/22/14  1612 History   First MD Initiated Contact with Patient 06/22/14 1649     Chief Complaint  Patient presents with  . Hemoptysis  . Sore Throat     HPI  Expand All Collapse All   Pt reports that he has had a sinus and respiratory infection and has been placed on two different antibiotics but it not feeling better. Pt reports that he has felt slightly SOB.  Patient has had hemoptysis over the last few days.  His voice is hoarse but he says is been that way for several years.  Has had no recent weight loss.  Does have some soreness to the left side of his neck.  Has no difficulty swallowing or no difficulty breathing.          Past Medical History  Diagnosis Date  . Carotid stenosis   . Coronary artery disease     s/p CABG 2006; stenting to vein graft 05-2013; NSTEMI 06-2013 in setting of PEA arrest  . Subclavian artery stenosis, left     s/p stenting 2006  . Hypertension   . Hyperlipidemia   . Ischemic cardiomyopathy     echo 06/2013 EF 10-15% (normal 01/2013)  . At risk for sudden cardiac death, has lifevest at discharge 06/20/2013  . Shock liver 06/20/2013  . Acute MI, troponin > 20, no obvious culprit vessel 06/20/2013  . Hypothermia, induced, post arrest 06/20/2013  . Acute encephalopathy, improved 06/20/2013  . Dizziness 06/20/2013  . Weakness due to cardiac arrest 06/20/2013  . Fever, possible aspiration-treated with antibiotics 06/20/2013  . Tobacco abuse     >100 pack year history   . Dyslipidemia 10/09/2013    [place] Study atorvastatin -eIRB # [id number] tablet Take 40 mg by mouth daily.   Marland Kitchen PAD (peripheral artery disease) 10/09/2013    Occluded left common carotid and moderate right internal carotid artery stenosis. Previous stent to the left subclavian artery. Right iliac stent. Bilateral 60-99% renal artery stenosis    . Heart failure, acute on chronic, systolic and diastolic 74/12/8784   Past Surgical History  Procedure Laterality Date  .  Angioplasty  05/2013    stent to SVG - LAD vein graft at [facility]  . Coronary artery bypass graft  09/2004    SVG to LAD (at [facility])  . Nm myocar perf wall motion  06/2008    persantine myoview - normal pattern of systolic thickening/wall motion/perfusion;  . Lower extremity arterial doppler  08/2008    right and left ABI - mild arterial insufficiency at rest; R CIA/stent with 50-69% narrowing, L CIA with increased velocities (50-69% diameter reduction)  . Renal artery doppler  05/2008    right and left prox renal arteries with narrowing and increased velocities (60-99% diameter reduction)  . Iliac artery stent  07/24/2008    8x6 Smart nitinol self-expanding stent to origin of ilaci down into external iliac crossing the hypogastric artery (Dr. Adora Fridge)  . Subclavian angiogram Left 08/20/2004    8x57mm Genesis balloon mounted stent  . Left heart catheterization with coronary/graft angiogram N/A 06/15/2013    Procedure: LEFT HEART CATHETERIZATION WITH Beatrix Fetters;  Surgeon: Sinclair Grooms, MD;  Location: [facility] CATH LAB;  Service: Cardiovascular;  Laterality: N/A;   Family History  Problem Relation Age of Onset  . Heart disease Maternal Aunt   . Heart attack Mother   . Stroke Mother   . Cancer Father   .  Stroke Sister   . Heart Problems Sister    History  Substance Use Topics  . Smoking status: Former Smoker -- 4.00 packs/day for 45 years    Types: Cigarettes    Quit date: 06/09/2013  . Smokeless tobacco: Former Systems developer     Comment: tried for a few weeks  . Alcohol Use: 12.6 - 25.2 oz/week    21-42 Cans of beer per week     Comment: 3-6 12 oz cans daily     Review of Systems All other systems reviewed and are negative   Allergies  Review of patient's allergies indicates no known allergies.  Home Medications   Prior to Admission medications   Medication Sig Start Date End Date Taking? Authorizing Provider  acetaminophen (TYLENOL) 500 MG tablet Take 1,000 mg by mouth every 6  (six) hours as needed for moderate pain.   Yes Historical Provider, MD  ALPRAZolam Duanne Moron) 0.5 MG tablet Take 0.5 mg by mouth See admin instructions. Take 1 tablet by mouth twice daily, allow for additional tablet as needed   Yes Historical Provider, MD  aspirin EC 81 MG tablet Take 81 mg by mouth daily.   Yes Historical Provider, MD  BRILINTA 90 MG TABS tablet TAKE 1 TABLET BY MOUTH TWICE DAILY 05/01/14  Yes Mihai Croitoru, MD  buPROPion (WELLBUTRIN XL) 300 MG 24 hr tablet Take 300 mg by mouth daily.   Yes Historical Provider, MD  carvedilol (COREG) 12.5 MG tablet Take 1 tablet (12.5 mg total) by mouth 2 (two) times daily. 08/09/13  Yes Luke K Kilroy, PA-C  furosemide (LASIX) 40 MG tablet Take 40 mg by mouth daily.   Yes Historical Provider, MD  lisinopril (PRINIVIL,ZESTRIL) 10 MG tablet Take 10mg  in the AM and 20mg  in the PM Patient taking differently: Take 10 mg by mouth 2 (two) times daily. Take 10mg  in the AM and 20mg  in the PM 03/13/14  Yes Mihai Croitoru, MD  loratadine (CLARITIN) 10 MG tablet Take 10 mg by mouth 2 (two) times daily.   Yes Historical Provider, MD  Multiple Vitamin (MULTIVITAMIN WITH MINERALS) TABS tablet Take 1 tablet by mouth daily.   Yes Historical Provider, MD  Multiple Vitamins-Minerals (MULTIVITAMIN GUMMIES ADULT) CHEW Chew 1 each by mouth 2 (two) times daily.   Yes Historical Provider, MD  nitroGLYCERIN (NITROSTAT) 0.4 MG SL tablet Place 0.4 mg under the tongue every 5 (five) minutes as needed for chest pain.   Yes Historical Provider, MD  pantoprazole (PROTONIX) 40 MG tablet TAKE 1 TABLET BY MOUTH EVERY DAY 04/17/14  Yes Mihai Croitoru, MD  potassium chloride SA (K-DUR,KLOR-CON) 20 MEQ tablet Take 1 tablet (20 mEq total) by mouth daily. 12/26/13  Yes Mihai Croitoru, MD  thiamine 100 MG tablet Take 1 tablet (100 mg total) by mouth daily. 06/20/13  Yes Isaiah Serge, NP   BP 137/74 mmHg  Pulse 91  Temp(Src) 98.1 F (36.7 C) (Oral)  Resp 18  Wt 179 lb (81.194 kg)  SpO2  97% Physical Exam  Constitutional: He is oriented to person, place, and time. He appears well-developed and well-nourished. No distress.  HENT:  Head: Normocephalic and atraumatic.  Mouth/Throat: Uvula is midline, oropharynx is clear and moist and mucous membranes are normal.  Eyes: Pupils are equal, round, and reactive to light.  Neck: Normal range of motion. No tracheal deviation present.  Cardiovascular: Normal rate and intact distal pulses.   Pulmonary/Chest: No stridor. No respiratory distress.  Abdominal: Normal appearance. He exhibits no distension.  Musculoskeletal: Normal range of motion.  Lymphadenopathy:    He has cervical adenopathy.       Left cervical: Deep cervical adenopathy present.  Neurological: He is alert and oriented to person, place, and time. No cranial nerve deficit.  Skin: Skin is warm and dry. No rash noted.  Psychiatric: He has a normal mood and affect. His behavior is normal.  Nursing note and vitals reviewed.   ED Course  Procedures (including critical care time) Labs Review Labs Reviewed  CBC WITH DIFFERENTIAL/PLATELET - Abnormal; Notable for the following:    WBC 10.7 (*)    RBC 3.67 (*)    Hemoglobin 11.3 (*)    HCT 33.8 (*)    All other components within normal limits  COMPREHENSIVE METABOLIC PANEL - Abnormal; Notable for the following:    Sodium 134 (*)    Chloride 96 (*)    Creatinine, Ser 1.31 (*)    GFR calc non Af Amer 57 (*)    All other components within normal limits  PROTIME-INR    Imaging Review Dg Chest 2 View  06/22/2014   CLINICAL DATA:  Hemoptysis for 4 weeks, recently finished antibiotics for left ear infection  EXAM: CHEST  2 VIEW  COMPARISON:  01/12/2014  FINDINGS: Heart size and vascular pattern normal. Status post CABG. No consolidation or effusion. Mild hyperinflation suggesting COPD. Diffuse osteopenia.  IMPRESSION: No active cardiopulmonary disease.   Electronically Signed   By: Skipper Cliche M.D.   On: 06/22/2014  17:24   Ct Soft Tissue Neck W Contrast  06/22/2014   CLINICAL DATA:  Sore throat and hemoptysis.  EXAM: CT NECK WITH CONTRAST  TECHNIQUE: Multidetector CT imaging of the neck was performed using the standard protocol following the bolus administration of intravenous contrast.  CONTRAST:  158mL OMNIPAQUE IOHEXOL 300 MG/ML  SOLN  COMPARISON:  None.  FINDINGS: Pharynx and larynx: There is a large supraglottic mass, originating on the LEFT, with extension to the RIGHT across the anterior commissure. Dimensions are 23 x 25 x 27 mm. There is significant compromise of the airway. LEFT arytenoid sclerosis. No definite thyroid cartilage destruction. 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 No other significant pharyngeal abnormalities.  Salivary glands: Normal.  Thyroid: Normal.  Lymph nodes: Normal.  Vascular: Atherosclerosis, with significant plaque of both carotid bifurcations, greater on the RIGHT. LEFT subclavian stent.  Limited intracranial: Negative.  Visualized orbits: Negative.  Mastoids and visualized paranasal sinuses: Negative.  Skeleton: Spondylosis throughout.  No worrisome osseous lesion.  Upper chest: No lung apex lesion.  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 No regional pathologic adenopathy. LEFT arytenoid sclerosis.  Significant carotid atherosclerosis.   Electronically Signed   By: Rolla Flatten M.D.   On: 06/22/2014 20:23     I discussed the case with Dr. [name] from otolaryngology.  He reviewed the CT scan and wants to see the patient in the office tomorrow 1 PM.  Patient is agreeable with this and wants to go home.  No airway difficulty at time of discharge.  No further hemoptysis during his stay.  Appears stable for discharge. MDM   Final diagnoses:  Hemoptysis  Throat mass        Leonard Schwartz, MD 06/22/14 2107

## 2014-06-22 NOTE — Telephone Encounter (Signed)
Spoke with patient's daughter. Patient has been coughing up blood for a couple of days. She states he has a "cup full of blood". Patient had called in last week (6/3) with similar complaints and was advised to monitor - seemed related to post nasal drip/discharge.   Patient is on ASA and Brilinta.   Advised daughter that these medications DO NOT cause bleeding but will exacerbate underlying issues. Advised that since this issue has been ongoing, I would take him to ED (if he were my parent). Daughter agreed with this.   Daughter reports that this happened about a year ago and his electrolytes, blood count got out of whack.   She will take patient to ED for eval.

## 2014-06-22 NOTE — Discharge Instructions (Signed)
Hemoptysis Hemoptysis means coughing up blood. The blood may come from the lungs and airways. It can also come from bleeding that occurs outside the lungs and airways. Coughing up blood can be a sign of a minor problem or a serious medical condition.  HOME CARE  Only take medicine as told by your doctor. Do not use medicines that help you stop coughing (cough suppressants) unless your doctor approves.  If you are given antibiotic medicine, take it as told. Finish it even if you start to feel better.  Do not smoke. Also avoid being around others when they are smoking.  Follow up with your doctor as told. GET HELP RIGHT AWAY IF:  You cough up bloody spit (mucus) for longer than a week.  You have a blood-producing cough that is severe or getting worse.  You have a blood-producing cough thatcomes and goes over time.  You have trouble breathing.   You throw up (vomit) blood.  You have bloody or black poop (stool).  You have chest pain.   You have night sweats.  You feel faint or pass out.   You have a fever or lasting symptoms for more than 2-3 days.  You have a fever and your symptoms suddenly get worse. MAKE SURE YOU:  Understand these instructions.  Will watch your condition.  Will get help right away if you are not doing well or get worse. Document Released: 12/17/2011 Document Reviewed: 12/17/2011 [facility] Patient Information 2015 [place]. This information is not intended to replace advice given to you by your health care provider. Make sure you discuss any questions you have with your health care provider. Cancer of the Larynx Cancer of the voice box (larynx) is sometimes called throat cancer. Most of the time it affects the vocal cords. These are smooth, fiberlike cords. They stretch across the opening of the larynx. Cancer occurs when cells in the larynx become abnormal and start to grow out of control. It usually starts in the very thin, flat cells  (squamous cells) on the outside of the larynx. Cancer cells can spread and form a mass of cells called a tumor. The tumor may spread deeper into the larynx, or it may spread to other areas of the body (metastasize). RISK FACTORS The exact cause of cancer of the larynx is not known. However, some risk factors make this more likely:  Use of cigarettes, cigars, pipes, smokeless (chewing) tobacco, and snuff. This is the biggest risk factor of cancer of the larynx.  Male gender.  Age of 70 years or older.  Frequent use of alcohol.  Human papillomavirus (HPV) infection.  Reflux esophagitis. Reflux esophagitis is the inflammation of the esophagus caused by regular backup of acid from the stomach into the esophagus. SYMPTOMS  A change in your voice. It may sound muffled or hoarse.  A lump on your neck.  A sore throat that does not go away.  Difficulty swallowing. It might hurt to swallow.  Ear pain.  Difficulty breathing, especially when doing exercise. Your breathing may be noisy (stridor), especially when you breathe out or breathe in very strongly. DIAGNOSIS To diagnose cancer of the larynx, your caregiver may perform the following exams:  A physical exam of your larynx. Your caregiver may use a mirror with a long handle or a thin, flexible tube with a tiny light and camera at the end (fiberscope) to see the back of your throat or your larynx. Your caregiver may also check for a sore or lump  in other areas of your mouth, throat, and neck.  Removal and exam of a small number of cells (biopsy) from your larynx or a lump in your neck. These cells are checked for cancerous formations under a microscope.  Imaging exams, such as X-rays of your larynx and neck. The images can show if there is an abnormal mass. If you do have cancer, your caregiver will stage your cancer. Staging provides an idea of how advanced your cancer is. The stage of your cancer will depend on how much your cancer has  grown and if it has metastasized. The meaning of the stage depends on the type of cancer. For cancer of the larynx:  Stage I means the cancer is growing only on a vocal cord. It has not metastasized.  Stage II means the cancer has spread just above or below the vocal cord. The vocal cord might not be able to move like it should.  Stage III means the cancer is affecting movement of the vocal cords. It may have spread to a lymph node or lymph gland on the same side of your neck as the cancer. (Lymph is a fluid that carries white blood cells all over your body. White blood cells fight infection.)  Stage IV means the cancer has spread to nearby areas. It may have spread heavily into your lymph glands. TREATMENT Treatment for cancer of the larynx can vary. It will depend on the stage of your cancer and your overall health. Treatment options may include:  Radiation therapy. This uses waves of nuclear energy to kill cancer cells on your larynx. It may be used for stage I and stage II cancers.  Surgery to remove the cancer cells. If you have stage III or stage IV cancer, surgery to remove your voice box (laryngectomy) may be necessary. If this is done, you may need to learn to use a device to speak again. You also may have a permanent hole (laryngectomy stoma) in your neck for breathing.  A combination of surgery, radiation, and drugs that kill cancer cells (chemotherapy). This may be used for stage III and stage IV cancers.  Additional surgery to make it easier for you to swallow and talk. This may be needed if the cancer has spread to the lymph nodes or other parts of the neck. SEEK MEDICAL CARE IF:  You have trouble swallowing, or you lose weight without trying.  Your voice changes in any way, or you have difficulty speaking. SEEK IMMEDIATE MEDICAL CARE IF:   You start having pain in your throat or neck.  You have bleeding in your throat or mouth.  Your throat or neck swells.  You have  difficulty swallowing.  You have difficulty breathing.  You have a fever. Document Released: 04/16/2010 Document Revised: 03/24/2011 Document Reviewed: 04/16/2010 [facility] Patient Information 2015 [place], [place]. This information is not intended to replace advice given to you by your health care provider. Make sure you discuss any questions you have with your health care provider.

## 2014-06-22 NOTE — ED Notes (Signed)
Pt back from x-ray.

## 2014-06-26 ENCOUNTER — Telehealth: Payer: Self-pay | Admitting: *Deleted

## 2014-06-26 DIAGNOSIS — I2510 Atherosclerotic heart disease of native coronary artery without angina pectoris: Secondary | ICD-10-CM

## 2014-06-26 NOTE — Telephone Encounter (Signed)
Lexiscan ordered for cardiac clearance for endoscopy and biopsy by Dr. [name] per Dr. Sallyanne Kuster.

## 2014-06-27 ENCOUNTER — Telehealth (HOSPITAL_COMMUNITY): Payer: Self-pay

## 2014-06-27 ENCOUNTER — Telehealth: Payer: Self-pay | Admitting: *Deleted

## 2014-06-27 NOTE — Telephone Encounter (Signed)
-----   Message from Sanda Klein, MD sent at 06/26/2014 12:50 PM EDT ----- Pamala Hurry, please have him stop the Brilinta if he is still taking it and schedule for [facility] before his 6/20 appointment.

## 2014-06-27 NOTE — Telephone Encounter (Signed)
Patient notified to stop the Brilinta (he was still taking).  Patient voiced understanding. Lexiscan scheduled 06/29/2014 at 1:30pm. Appointment with Dr. Loletha Grayer Monday 07/03/2014 8:45am. Verified appts with patient.

## 2014-06-27 NOTE — Telephone Encounter (Signed)
Encounter complete. 

## 2014-06-27 NOTE — Telephone Encounter (Signed)
-----   Message from Sanda Klein, MD sent at 06/26/2014 12:50 PM EDT ----- Pamala Hurry, please have him stop the Brilinta if he is still taking it and schedule for [facility] before his 6/20 appointment.

## 2014-06-28 ENCOUNTER — Telehealth (HOSPITAL_COMMUNITY): Payer: Self-pay

## 2014-06-28 NOTE — Telephone Encounter (Signed)
Encounter complete. 

## 2014-06-29 ENCOUNTER — Ambulatory Visit (HOSPITAL_COMMUNITY)
Admission: RE | Admit: 2014-06-29 | Discharge: 2014-06-29 | Disposition: A | Discharge status: Home / Self Care | Payer: Medicaid Other | Source: Ambulatory Visit | Attending: Cardiovascular Disease | Admitting: Cardiovascular Disease

## 2014-06-29 ENCOUNTER — Telehealth (HOSPITAL_COMMUNITY): Payer: Self-pay

## 2014-06-29 ENCOUNTER — Encounter: Payer: Self-pay | Admitting: Cardiovascular Disease

## 2014-06-29 DIAGNOSIS — I2510 Atherosclerotic heart disease of native coronary artery without angina pectoris: Secondary | ICD-10-CM | POA: Diagnosis not present

## 2014-06-29 MED ORDER — TECHNETIUM TC 99M SESTAMIBI GENERIC - CARDIOLITE
10.5000 | Freq: Once | INTRAVENOUS | Status: AC | PRN
Start: 2014-06-29 — End: 2014-06-29
  Administered 2014-06-29: 11 via INTRAVENOUS

## 2014-06-29 NOTE — Telephone Encounter (Signed)
Encounter complete. 

## 2014-07-01 ENCOUNTER — Telehealth: Payer: Self-pay | Admitting: Cardiology

## 2014-07-01 NOTE — Telephone Encounter (Signed)
Patient called stating that he was told when he had his stress test done last week that he should be on an inhaler but no one ordered it.  I cannot find anything in the chart stating he should be on an inhaler and he has no historyo f COPD.  He says that he only gets SOB at night.  He has a history of CHF and I recommended that if he is having SOB he should go to the ER to be evaluated.  The patient stated he would think about it.

## 2014-07-03 ENCOUNTER — Telehealth: Payer: Self-pay | Admitting: *Deleted

## 2014-07-03 ENCOUNTER — Ambulatory Visit: Payer: Self-pay | Admitting: Cardiovascular Disease

## 2014-07-03 MED ORDER — ALBUTEROL SULFATE HFA 108 (90 BASE) MCG/ACT IN AERS: 2.0000 | INHALATION_SPRAY | Freq: Four times a day (QID) | RESPIRATORY_TRACT | Status: DC | PRN

## 2014-07-03 NOTE — Telephone Encounter (Signed)
Please call him to follow up.

## 2014-07-03 NOTE — Telephone Encounter (Signed)
Per Dr. Sallyanne Kuster - send in Rx For an Albuterol Inhaler q6 hours as needed.  Patient notified.  He is having nuclear study finished tomorrow and surgical clearance will be determined at that time.

## 2014-07-03 NOTE — Telephone Encounter (Signed)
Patient notified Dr. Loletha Grayer wants him to start using an Atrovent Inhaler as needed for wheezing and SOB and to bring with him for the Nuclear Study tomorrow.  Patient voiced understanding and will pick up from the pharmacy today.

## 2014-07-04 ENCOUNTER — Ambulatory Visit (HOSPITAL_COMMUNITY)
Admission: RE | Admit: 2014-07-04 | Discharge: 2014-07-04 | Disposition: A | Discharge status: Home / Self Care | Payer: Medicaid Other | Source: Ambulatory Visit | Attending: Cardiovascular Disease | Admitting: Cardiovascular Disease

## 2014-07-04 DIAGNOSIS — I2510 Atherosclerotic heart disease of native coronary artery without angina pectoris: Secondary | ICD-10-CM | POA: Insufficient documentation

## 2014-07-04 LAB — MYOCARDIAL PERFUSION IMAGING
CSEPPHR: 103 {beats}/min
Rest HR: 94 {beats}/min
TID: 1.31

## 2014-07-04 MED ORDER — AMINOPHYLLINE 25 MG/ML IV SOLN
75.0000 mg | Freq: Once | INTRAVENOUS | Status: AC
  Administered 2014-07-04: 75 mg via INTRAVENOUS

## 2014-07-04 MED ORDER — TECHNETIUM TC 99M SESTAMIBI GENERIC - CARDIOLITE
30.9000 | Freq: Once | INTRAVENOUS | Status: AC | PRN
Start: 2014-07-04 — End: 2014-07-04
  Administered 2014-07-04: 30.9 via INTRAVENOUS

## 2014-07-04 MED ORDER — REGADENOSON 0.4 MG/5ML IV SOLN
0.4000 mg | Freq: Once | INTRAVENOUS | Status: AC
  Administered 2014-07-04: 0.4 mg via INTRAVENOUS

## 2014-07-05 ENCOUNTER — Encounter (HOSPITAL_COMMUNITY): Payer: Self-pay | Admitting: *Deleted

## 2014-07-05 ENCOUNTER — Ambulatory Visit: Payer: Self-pay | Admitting: Otolaryngology

## 2014-07-05 NOTE — Progress Notes (Signed)
   07/05/14 1652  OBSTRUCTIVE SLEEP APNEA  Have you ever been diagnosed with sleep apnea through a sleep study? No  Do you snore loudly (loud enough to be heard through closed doors)?  1  Do you often feel tired, fatigued, or sleepy during the daytime? 1  Has anyone observed you stop breathing during your sleep? 1  Do you have, or are you being treated for high blood pressure? 1  BMI more than 35 kg/m2? 0  Age over 61 years old? 1  Gender: 1

## 2014-07-05 NOTE — Progress Notes (Signed)
Pt has an extensive cardiac history. Cardiac clearance noted in EPIC. Pt denies any recent chest pain or sob.

## 2014-07-05 NOTE — H&P (Signed)
Assessment  Chronic congestive heart failure, unspecified congestive heart failure type (428.0) (I50.9). 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 Left ear pain (388.70) (H92.02). Alcohol drinker (V49.89) (Z78.9). Former smoker [phone]) [phone]). 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 We will need tissue diagnosis. I will speak with his cardiologist early next week to see if it is going to be possible to examination under anesthesia with endoscopy and biopsy. If possible, we will do that as soon as possible.Then we can refer him to medical and radiation oncology. I had a discussion with him about quitting alcohol. Reason For Visit  Sore throat and earache. HPI  Quit smoking one year ago. Drinks about 4 beers daily. Severe heart disease including heart failure. Several month history of hoarseness, left ear pain, recently hemoptysis. CT scan last night in the emergency department revealed a left-sided supraglottic cancer. Allergies  Rec: [id number].  List Reconciled and Reviewed. No Known Drug Allergies. Current Meds  Rec: [id number].  List Reconciled and Reviewed. ALPRAZolam 0.5 MG Oral Tablet;TK 1 T PO BID; RPT Brilinta 90 MG Oral Tablet;TK 1 T PO BID; RPT BuPROPion HCl ER (XL) 300 MG Oral Tablet Extended Release 24 Hour;TK 1 T PO ONCE D; RPT Carvedilol 12.5 MG Oral Tablet;TK 1 T PO  BID.; RPT Furosemide 40 MG Oral Tablet;TK 2 TS PO IN THE MORNING THEN TK 1 T PO IN THE EVENING; RPT Lisinopril 10 MG Oral Tablet;TK 1 T PO IN THE MORNING THEN TK 0.5 T IN THE EVENING  FOR 2 WEEK THEN; RPT Pantoprazole Sodium 40 MG Oral Tablet Delayed Release;TK 1 T PO  QD; RPT Potassium Chloride Crys ER 20 MEQ Oral Tablet Extended Release;TK 1 T PO QD; RPT Aspirin Low Dose 81 MG TABS;; RPT Multivitamins TABS;; RPT Nitroglycerin SUBL;; RPT Thiamine CAPS;; RPT. Active Problems  Alcohol drinker   (V49.89) (Z78.9) Chronic congestive heart failure, unspecified congestive  heart failure type   (428.0) (I50.9) Former smoker   (V15.82) (K93.818) Laryngeal mass   (478.79) (J38.7) Left ear pain   (388.70) (H92.02). PMH  History of myocardial infarction (412) (I25.2). PSH  CABG. Family Hx  Family history of heart disease: Mother (V17.49) (Z82.49) Family history of lung cancer: Father (V16.1) (Z80.1). Personal Hx  Alcohol drinker (V49.89) (Z78.9) Alcohol use (V49.89) (Z78.9); > 2 drinks day Caffeine use (V49.89) (F15.90); 2 cups day Former smoker [phone]) Former smoker 2015 (V15.82) [phone]). ROS  Systemic: Not feeling tired (fatigue).  No fever, no night sweats, and no recent weight loss. Head: No headache. Eyes: No eye symptoms. Otolaryngeal: No hearing loss.  Earache.  No tinnitus  and no purulent nasal discharge.  No nasal passage blockage (stuffiness), no snoring, no sneezing, and no hoarseness.  Sore throat. Cardiovascular: No chest pain or discomfort  and no palpitations. Pulmonary: Dyspnea.  No cough  and no wheezing. Gastrointestinal: No dysphagia  and no heartburn.  No nausea, no abdominal pain, and no melena.  No diarrhea. Genitourinary: No dysuria. Endocrine: No muscle weakness. Musculoskeletal: Calf muscle cramps.  No arthralgias  and no soft tissue swelling. Neurological: No dizziness, no fainting, no tingling, and no numbness. Psychological: No anxiety  and no depression. Skin: No rash. 12 system ROS was obtained and reviewed on the Health Maintenance form dated today.  Positive responses are shown above.  If the symptom is not checked, the patient has denied it. Vital Signs   Recorded by Iran Sizer on 23 Jun 2014 02:36 PM BP:126/74,  BSA Calculated: 1.96 ,  BMI Calculated: 28.98 ,  Weight: 185 lb , BMI: 29 kg/m2,  Height: 5 ft 7 in. Physical Exam  APPEARANCE: Well developed, well nourished, in no acute distress.  Normal affect, in a pleasant mood.  Oriented to time, place and person. COMMUNICATION: Harsh and  strained voice, with mild inspiratory stridor. HEAD & FACE:  No scars, lesions or masses of head and face.  Sinuses nontender to palpation.  Salivary glands without mass or tenderness.  Facial strength symmetric.  No facial lesion, scars, or mass. EYES: EOMI with normal primary gaze alignment. Visual acuity grossly intact.  PERRLA EXTERNAL EAR & NOSE: No scars, lesions or masses  EAC & TYMPANIC MEMBRANE:  EAC shows no obstructing lesions or debris and tympanic membranes are normal bilaterally with good movement to insufflation. GROSS HEARING: Normal  TMJ:  Nontender  INTRANASAL EXAM: No polyps or purulence.  NASOPHARYNX: Normal, without lesions. LIPS, TEETH & GUMS: No lip lesions, severely diseased upper and lower didn't patient and normal gums. ORAL CAVITY/OROPHARYNX:  Oral mucosa moist without lesion or asymmetry of the palate, tongue, tonsil or posterior pharynx. LARYNX (mirror exam): Large erythematous mass involving the left hemilarynx, supraglottis. Left vocal cord does not appear to be fully mobile although it somewhat difficult to tell because of the bulk of the mass. Unable to ascertain if it involves the pyriform sinus. NECK:  Supple without adenopathy or mass. THYROID:  Normal with no masses palpable.  NEUROLOGIC:  No gross CN deficits. No nystagmus noted.   LYMPHATIC:  No enlarged nodes palpable. Signature  Electronically signed by : [name]  M.D.; 06/23/2014 2:55 PM EST.

## 2014-07-06 ENCOUNTER — Encounter (HOSPITAL_COMMUNITY)
Admission: RE | Disposition: A | Discharge status: Home / Self Care | Payer: Self-pay | Source: Ambulatory Visit | Attending: Otolaryngology

## 2014-07-06 ENCOUNTER — Encounter (HOSPITAL_COMMUNITY): Payer: Self-pay | Admitting: Certified Registered Nurse Anesthetist

## 2014-07-06 ENCOUNTER — Ambulatory Visit (HOSPITAL_COMMUNITY)
Admission: RE | Admit: 2014-07-06 | Discharge: 2014-07-06 | Disposition: A | Discharge status: Home / Self Care | Payer: Self-pay | Source: Ambulatory Visit | Attending: Otolaryngology | Admitting: Otolaryngology

## 2014-07-06 ENCOUNTER — Ambulatory Visit (HOSPITAL_COMMUNITY): Payer: Medicaid Other | Admitting: Anesthesiology

## 2014-07-06 ENCOUNTER — Ambulatory Visit (HOSPITAL_COMMUNITY): Payer: Self-pay | Admitting: Anesthesiology

## 2014-07-06 DIAGNOSIS — I509 Heart failure, unspecified: Secondary | ICD-10-CM | POA: Insufficient documentation

## 2014-07-06 DIAGNOSIS — Z87891 Personal history of nicotine dependence: Secondary | ICD-10-CM | POA: Insufficient documentation

## 2014-07-06 DIAGNOSIS — C329 Malignant neoplasm of larynx, unspecified: Secondary | ICD-10-CM | POA: Insufficient documentation

## 2014-07-06 DIAGNOSIS — I252 Old myocardial infarction: Secondary | ICD-10-CM | POA: Insufficient documentation

## 2014-07-06 HISTORY — DX: Anxiety disorder, unspecified: F41.9

## 2014-07-06 HISTORY — PX: DIRECT LARYNGOSCOPY: SHX5326

## 2014-07-06 HISTORY — DX: Gastro-esophageal reflux disease without esophagitis: K21.9

## 2014-07-06 LAB — BASIC METABOLIC PANEL
ANION GAP: 12 (ref 5–15)
BUN: 15 mg/dL (ref 6–20)
CHLORIDE: 94 mmol/L — AB (ref 101–111)
CO2: 32 mmol/L (ref 22–32)
CREATININE: 1.23 mg/dL (ref 0.61–1.24)
Calcium: 9.7 mg/dL (ref 8.9–10.3)
GFR calc Af Amer: >60 mL/min (ref >60)
Glucose, Bld: 96 mg/dL (ref 65–99)
Potassium: 3.6 mmol/L (ref 3.5–5.1)
Sodium: 138 mmol/L (ref 135–145)

## 2014-07-06 LAB — CBC
HCT: 37.6 % — ABNORMAL LOW (ref 39.0–52.0)
Hemoglobin: 12.5 g/dL — ABNORMAL LOW (ref 13.0–17.0)
MCH: 30.6 pg (ref 26.0–34.0)
MCHC: 33.2 g/dL (ref 30.0–36.0)
MCV: 92.2 fL (ref 78.0–100.0)
Platelets: 378 10*3/uL (ref 150–400)
RBC: 4.08 MIL/uL — ABNORMAL LOW (ref 4.22–5.81)
RDW: 14.4 % (ref 11.5–15.5)
WBC: 11.2 10*3/uL — AB (ref 4.0–10.5)

## 2014-07-06 SURGERY — LARYNGOSCOPY, DIRECT
Anesthesia: General | Site: Throat

## 2014-07-06 MED ORDER — EPINEPHRINE HCL (NASAL) 0.1 % NA SOLN
NASAL | Status: DC | PRN
  Administered 2014-07-06: 30 mL

## 2014-07-06 MED ORDER — ROCURONIUM BROMIDE 50 MG/5ML IV SOLN
INTRAVENOUS | Status: AC
  Filled 2014-07-06: qty 1

## 2014-07-06 MED ORDER — MIDAZOLAM HCL 5 MG/5ML IJ SOLN
INTRAMUSCULAR | Status: DC | PRN
  Administered 2014-07-06: 2 mg via INTRAVENOUS

## 2014-07-06 MED ORDER — ARTIFICIAL TEARS OP OINT
TOPICAL_OINTMENT | OPHTHALMIC | Status: AC
  Filled 2014-07-06: qty 3.5

## 2014-07-06 MED ORDER — LIDOCAINE HCL (CARDIAC) 20 MG/ML IV SOLN
INTRAVENOUS | Status: DC | PRN
  Administered 2014-07-06: 40 mg via INTRAVENOUS

## 2014-07-06 MED ORDER — HYDROCODONE-ACETAMINOPHEN 7.5-325 MG/15ML PO SOLN
ORAL | Status: AC
  Filled 2014-07-06: qty 15

## 2014-07-06 MED ORDER — HYDROCODONE-ACETAMINOPHEN 7.5-325 MG PO TABS: 1.0000 | ORAL_TABLET | Freq: Four times a day (QID) | ORAL | Status: DC | PRN

## 2014-07-06 MED ORDER — DEXTROSE 5 % IV SOLN
10.0000 mg | INTRAVENOUS | Status: DC | PRN
  Administered 2014-07-06: 50 ug/min via INTRAVENOUS

## 2014-07-06 MED ORDER — HYDROCODONE-ACETAMINOPHEN 7.5-325 MG/15ML PO SOLN
15.0000 mL | Freq: Once | ORAL | Status: AC
  Administered 2014-07-06: 15 mL via ORAL
  Filled 2014-07-06: qty 15

## 2014-07-06 MED ORDER — CARVEDILOL 12.5 MG PO TABS
ORAL_TABLET | ORAL | Status: AC
  Administered 2014-07-06: 12.5 mg via ORAL
  Filled 2014-07-06: qty 1

## 2014-07-06 MED ORDER — CARVEDILOL 12.5 MG PO TABS
12.5000 mg | ORAL_TABLET | Freq: Once | ORAL | Status: AC
  Administered 2014-07-06: 12.5 mg via ORAL
  Filled 2014-07-06: qty 1

## 2014-07-06 MED ORDER — DEXAMETHASONE SODIUM PHOSPHATE 4 MG/ML IJ SOLN
INTRAMUSCULAR | Status: AC
  Filled 2014-07-06: qty 1

## 2014-07-06 MED ORDER — ONDANSETRON HCL 4 MG/2ML IJ SOLN
INTRAMUSCULAR | Status: DC | PRN
  Administered 2014-07-06: 4 mg via INTRAVENOUS

## 2014-07-06 MED ORDER — FENTANYL CITRATE (PF) 100 MCG/2ML IJ SOLN
INTRAMUSCULAR | Status: DC | PRN
  Administered 2014-07-06: 50 ug via INTRAVENOUS

## 2014-07-06 MED ORDER — 0.9 % SODIUM CHLORIDE (POUR BTL) OPTIME
TOPICAL | Status: DC | PRN
  Administered 2014-07-06: 1000 mL

## 2014-07-06 MED ORDER — HYDROMORPHONE HCL 1 MG/ML IJ SOLN
INTRAMUSCULAR | Status: AC
  Filled 2014-07-06: qty 1

## 2014-07-06 MED ORDER — LACTATED RINGERS IV SOLN
INTRAVENOUS | Status: DC
  Administered 2014-07-06 (×2): via INTRAVENOUS

## 2014-07-06 MED ORDER — PROPOFOL 10 MG/ML IV BOLUS
INTRAVENOUS | Status: DC | PRN
  Administered 2014-07-06: 100 mg via INTRAVENOUS
  Administered 2014-07-06: 30 mg via INTRAVENOUS

## 2014-07-06 MED ORDER — PROPOFOL 10 MG/ML IV BOLUS
INTRAVENOUS | Status: AC
  Filled 2014-07-06: qty 20

## 2014-07-06 MED ORDER — MIDAZOLAM HCL 2 MG/2ML IJ SOLN
INTRAMUSCULAR | Status: AC
  Filled 2014-07-06: qty 2

## 2014-07-06 MED ORDER — SUCCINYLCHOLINE CHLORIDE 20 MG/ML IJ SOLN
INTRAMUSCULAR | Status: AC
  Filled 2014-07-06: qty 1

## 2014-07-06 MED ORDER — EPINEPHRINE HCL (NASAL) 0.1 % NA SOLN
NASAL | Status: AC
  Filled 2014-07-06: qty 30

## 2014-07-06 MED ORDER — HYDROMORPHONE HCL 1 MG/ML IJ SOLN
0.2500 mg | INTRAMUSCULAR | Status: DC | PRN
  Administered 2014-07-06: 0.5 mg via INTRAVENOUS

## 2014-07-06 MED ORDER — LIDOCAINE HCL (CARDIAC) 20 MG/ML IV SOLN
INTRAVENOUS | Status: AC
  Filled 2014-07-06: qty 5

## 2014-07-06 MED ORDER — FENTANYL CITRATE (PF) 250 MCG/5ML IJ SOLN
INTRAMUSCULAR | Status: AC
  Filled 2014-07-06: qty 5

## 2014-07-06 MED ORDER — LIDOCAINE-EPINEPHRINE 1 %-1:100000 IJ SOLN
INTRAMUSCULAR | Status: AC
  Filled 2014-07-06: qty 1

## 2014-07-06 MED ORDER — ONDANSETRON HCL 4 MG/2ML IJ SOLN
INTRAMUSCULAR | Status: AC
  Filled 2014-07-06: qty 2

## 2014-07-06 MED ORDER — DEXAMETHASONE SODIUM PHOSPHATE 4 MG/ML IJ SOLN
INTRAMUSCULAR | Status: DC | PRN
  Administered 2014-07-06: 4 mg via INTRAVENOUS

## 2014-07-06 SURGICAL SUPPLY — 31 items
BALLN PULM 15 16.5 18 X 75CM (BALLOONS)
BALLN PULM 15 16.5 18X75 (BALLOONS)
BALLOON PULM 15 16.5 18X75 (BALLOONS) IMPLANT
BLADE SURG 15 STRL LF DISP TIS (BLADE) IMPLANT
BLADE SURG 15 STRL SS (BLADE)
CANISTER SUCTION 2500CC (MISCELLANEOUS) ×3 IMPLANT
CONT SPECI 4OZ STER CLIK (MISCELLANEOUS) ×3 IMPLANT
COVER MAYO STAND STRL (DRAPES) ×3 IMPLANT
COVER TABLE BACK 60X90 (DRAPES) ×3 IMPLANT
DRAPE PROXIMA HALF (DRAPES) ×3 IMPLANT
GAUZE SPONGE 4X4 16PLY XRAY LF (GAUZE/BANDAGES/DRESSINGS) ×3 IMPLANT
GLOVE BIOGEL PI IND STRL 7.0 (GLOVE) ×1 IMPLANT
GLOVE BIOGEL PI IND STRL 7.5 (GLOVE) ×1 IMPLANT
GLOVE BIOGEL PI INDICATOR 7.0 (GLOVE) ×2
GLOVE BIOGEL PI INDICATOR 7.5 (GLOVE) ×2
GLOVE ECLIPSE 7.5 STRL STRAW (GLOVE) ×3 IMPLANT
GLOVE SURG SS PI 7.0 STRL IVOR (GLOVE) ×6 IMPLANT
GLOVE SURG SS PI 7.5 STRL IVOR (GLOVE) ×3 IMPLANT
GUARD TEETH (MISCELLANEOUS) IMPLANT
KIT BASIN OR (CUSTOM PROCEDURE TRAY) ×3 IMPLANT
KIT ROOM TURNOVER OR (KITS) ×3 IMPLANT
NEEDLE 27GAX1X1/2 (NEEDLE) IMPLANT
NS IRRIG 1000ML POUR BTL (IV SOLUTION) ×3 IMPLANT
PAD ARMBOARD 7.5X6 YLW CONV (MISCELLANEOUS) ×3 IMPLANT
PATTIES SURGICAL .5 X3 (DISPOSABLE) ×3 IMPLANT
SOLUTION ANTI FOG 6CC (MISCELLANEOUS) ×3 IMPLANT
SPECIMEN JAR SMALL (MISCELLANEOUS) IMPLANT
SYR TB 1ML LUER SLIP (SYRINGE) IMPLANT
TOWEL OR 17X24 6PK STRL BLUE (TOWEL DISPOSABLE) ×3 IMPLANT
TUBE CONNECTING 12'X1/4 (SUCTIONS) ×1
TUBE CONNECTING 12X1/4 (SUCTIONS) ×2 IMPLANT

## 2014-07-06 NOTE — Anesthesia Procedure Notes (Signed)
Procedure Name: Intubation Date/Time: 07/06/2014 7:34 AM Performed by: Maryland Pink Pre-anesthesia Checklist: Patient identified, Emergency Drugs available, Suction available, Patient being monitored and Timeout performed Patient Re-evaluated:Patient Re-evaluated prior to inductionOxygen Delivery Method: Circle system utilized Preoxygenation: Pre-oxygenation with 100% oxygen Intubation Type: IV induction Ventilation: Two handed mask ventilation required Laryngoscope Size: Glidescope (large adult) Grade View: Grade IV Tube type: Oral Tube size: 6.0 mm Number of attempts: 2 Airway Equipment and Method: Video-laryngoscopy Placement Confirmation: ETT inserted through vocal cords under direct vision,  positive ETCO2 and breath sounds checked- equal and bilateral Tube secured with: marked for surgeon at 20 cm. Dental Injury: Bloody posterior oropharynx  Difficulty Due To: Difficulty was anticipated Future Recommendations: Recommend- induction with short-acting agent, and alternative techniques readily available Comments: Difficult airway due to laryngeal mass. Dr. [name] from ENT at bedside for induction. Induction with propofol after pre-oxygenation with 100% O2. Two handed mask ventilation utilized. DL x1 by A. Nelda Marseille, CRNA laryngeal mass atop glottic opening, attempt to pass 6.5 oral ETT unsuccessful. 2 handed mask ventilation resumed VSS. DL x 2 by Roderic Palau, MD 6.0 oral ETT placed. Positive EtCO2, bilateral breath sounds present

## 2014-07-06 NOTE — Transfer of Care (Signed)
Immediate Anesthesia Transfer of Care Note  Patient: [name]  Procedure(s) Performed: Procedure(s): DIRECT LARYNGOSCOPY AND ESOPAGOSCOPY WITH BIOPSY (N/A)  Patient Location: PACU  Anesthesia Type:General  Level of Consciousness: awake, alert  and oriented  Airway & Oxygen Therapy: Patient Spontanous Breathing and Patient connected to nasal cannula oxygen  Post-op Assessment: Report given to RN and Post -op Vital signs reviewed and stable  Post vital signs: Reviewed and stable  Last Vitals:  Filed Vitals:   07/06/14 0820  BP:   Pulse:   Temp: 36.4 C  Resp:     Complications: No apparent anesthesia complications

## 2014-07-06 NOTE — Anesthesia Postprocedure Evaluation (Signed)
  Anesthesia Post-op Note  Patient: [name]  Procedure(s) Performed: Procedure(s): DIRECT LARYNGOSCOPY AND ESOPAGOSCOPY WITH BIOPSY (N/A)  Patient Location: PACU  Anesthesia Type:General  Level of Consciousness: awake and alert   Airway and Oxygen Therapy: Patient Spontanous Breathing  Post-op Pain: none  Post-op Assessment: Post-op Vital signs reviewed, Patient's Cardiovascular Status Stable and Respiratory Function Stable  Post-op Vital Signs: Reviewed  Filed Vitals:   07/06/14 1100  BP: 107/75  Pulse: 72  Temp:   Resp: 14    Complications: No apparent anesthesia complications

## 2014-07-06 NOTE — Op Note (Signed)
OPERATIVE REPORT  DATE OF SURGERY: 07/06/2014  PATIENT:  [name],  59 y.o. male  PRE-OPERATIVE DIAGNOSIS:  LARYNGEAL MASS  POST-OPERATIVE DIAGNOSIS:  LARYNGEAL MASS  PROCEDURE:  Procedure(s): DIRECT LARYNGOSCOPY AND ESOPAGOSCOPY WITH BIOPSY  SURGEON:  Beckie Salts, MD  ASSISTANTS: None  ANESTHESIA:   General   EBL:  20 ml  DRAINS: None  LOCAL MEDICATIONS USED:  None  SPECIMEN:  Left laryngeal biopsy  COUNTS:  Correct  PROCEDURE DETAILS: The patient was taken to the operating room and placed on the operating table in the supine position. Following induction of general endotracheal anesthesia which required intubation with a glide scope, and there were some difficulty due to the obstructing nature of the tumor, but ultimately he was intubated without difficulty or complication, the table was turned and the patient was draped in a standard fashion. A maxillary tooth protector was used throughout the case.   1. Esophagoscopy. A rigid cervical esophagoscope was passed through the oral cavity, through the cricopharyngeal opening and into the upper esophagus. It was slowly withdrawn while inspecting the esophageal mucosa and suctioning secretions. There are no lesions identified in the esophagus.   2. Direct laryngoscopy with biopsy. A Jako laryngoscope was used to inspect the larynx and all of the surrounding structures. A large exophytic tumor was identified that seem to be arising from the left supraglottic larynx, aryepiglottic fold. It extended from the anterior commissure and the petiole of the epiglottis anteriorly, to the arytenoid posteriorly. The entire aryepiglottic fold was involved. The piriform sinus was clear. I was unable to visualize any of the left vocal cord or the subglottic area. Multiple large biopsies were taken and the tumor was debulked. The right cord was  much more easily visualized after the debulking.  Topical adrenaline was used on pledgets for  hemostasis. All secretions were suctioned out of the pharynx. The table was turned back to its normal position. Patient was awakened from anesthesia, extubated without difficulty and transferred to recovery in stable condition.    PATIENT DISPOSITION:  To PACU, stable

## 2014-07-06 NOTE — Anesthesia Preprocedure Evaluation (Addendum)
Anesthesia Evaluation  Patient identified by MRN, date of birth, ID band Patient awake    Reviewed: Allergy & Precautions, H&P , NPO status , Patient's Chart, lab work & pertinent test results, reviewed documented beta blocker date and time   Airway Mallampati: II  TM Distance: >3 FB Neck ROM: Full    Dental no notable dental hx. (+) Edentulous Upper, Dental Advisory Given   Pulmonary neg pulmonary ROS, former smoker,  breath sounds clear to auscultation  Pulmonary exam normal       Cardiovascular hypertension, Pt. on medications and Pt. on home beta blockers + CAD, + Past MI, + CABG and + Peripheral Vascular Disease Rhythm:Regular Rate:Normal     Neuro/Psych Anxiety negative neurological ROS  negative psych ROS   GI/Hepatic GERD-  Medicated and Controlled,(+) Hepatitis -  Endo/Other  negative endocrine ROS  Renal/GU negative Renal ROS  negative genitourinary   Musculoskeletal   Abdominal   Peds  Hematology negative hematology ROS (+)   Anesthesia Other Findings   Reproductive/Obstetrics negative OB ROS                            Anesthesia Physical Anesthesia Plan  ASA: III  Anesthesia Plan: General   Post-op Pain Management:    Induction: Intravenous  Airway Management Planned: Oral ETT  Additional Equipment:   Intra-op Plan:   Post-operative Plan: Extubation in OR  Informed Consent: I have reviewed the patients History and Physical, chart, labs and discussed the procedure including the risks, benefits and alternatives for the proposed anesthesia with the patient or authorized representative who has indicated his/her understanding and acceptance.   Dental advisory given  Plan Discussed with: CRNA  Anesthesia Plan Comments:         Anesthesia Quick Evaluation

## 2014-07-06 NOTE — Interval H&P Note (Signed)
History and Physical Interval Note:  07/06/2014 7:04 AM  [name]  has presented today for surgery, with the diagnosis of LARYNGEAL MASS  The various methods of treatment have been discussed with the patient and family. After consideration of risks, benefits and other options for treatment, the patient has consented to  Procedure(s): DIRECT LARYNGOSCOPY AND BIOPSY AND ESOPAGOSCOPY (N/A) as a surgical intervention .  The patient's history has been reviewed, patient examined, no change in status, stable for surgery.  I have reviewed the patient's chart and labs.  Questions were answered to the patient's satisfaction.     [name]

## 2014-07-06 NOTE — H&P (View-Only) (Signed)
Assessment  Chronic congestive heart failure, unspecified congestive heart failure type (428.0) (I50.9). 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 Left ear pain (388.70) (H92.02). Alcohol drinker (V49.89) (Z78.9). Former smoker [phone]) [phone]). 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 We will need tissue diagnosis. I will speak with his cardiologist early next week to see if it is going to be possible to examination under anesthesia with endoscopy and biopsy. If possible, we will do that as soon as possible.Then we can refer him to medical and radiation oncology. I had a discussion with him about quitting alcohol. Reason For Visit  Sore throat and earache. HPI  Quit smoking one year ago. Drinks about 4 beers daily. Severe heart disease including heart failure. Several month history of hoarseness, left ear pain, recently hemoptysis. CT scan last night in the emergency department revealed a left-sided supraglottic cancer. Allergies  Rec: [id number].  List Reconciled and Reviewed. No Known Drug Allergies. Current Meds  Rec: [id number].  List Reconciled and Reviewed. ALPRAZolam 0.5 MG Oral Tablet;TK 1 T PO BID; RPT Brilinta 90 MG Oral Tablet;TK 1 T PO BID; RPT BuPROPion HCl ER (XL) 300 MG Oral Tablet Extended Release 24 Hour;TK 1 T PO ONCE D; RPT Carvedilol 12.5 MG Oral Tablet;TK 1 T PO  BID.; RPT Furosemide 40 MG Oral Tablet;TK 2 TS PO IN THE MORNING THEN TK 1 T PO IN THE EVENING; RPT Lisinopril 10 MG Oral Tablet;TK 1 T PO IN THE MORNING THEN TK 0.5 T IN THE EVENING  FOR 2 WEEK THEN; RPT Pantoprazole Sodium 40 MG Oral Tablet Delayed Release;TK 1 T PO  QD; RPT Potassium Chloride Crys ER 20 MEQ Oral Tablet Extended Release;TK 1 T PO QD; RPT Aspirin Low Dose 81 MG TABS;; RPT Multivitamins TABS;; RPT Nitroglycerin SUBL;; RPT Thiamine CAPS;; RPT. Active Problems  Alcohol drinker   (V49.89) (Z78.9) Chronic congestive heart failure, unspecified congestive  heart failure type   (428.0) (I50.9) Former smoker   (V15.82) (S50.539) Laryngeal mass   (478.79) (J38.7) Left ear pain   (388.70) (H92.02). PMH  History of myocardial infarction (412) (I25.2). PSH  CABG. Family Hx  Family history of heart disease: Mother (V17.49) (Z82.49) Family history of lung cancer: Father (V16.1) (Z80.1). Personal Hx  Alcohol drinker (V49.89) (Z78.9) Alcohol use (V49.89) (Z78.9); > 2 drinks day Caffeine use (V49.89) (F15.90); 2 cups day Former smoker [phone]) [phone]) Former smoker 2015 (V15.82) [phone]). ROS  Systemic: Not feeling tired (fatigue).  No fever, no night sweats, and no recent weight loss. Head: No headache. Eyes: No eye symptoms. Otolaryngeal: No hearing loss.  Earache.  No tinnitus  and no purulent nasal discharge.  No nasal passage blockage (stuffiness), no snoring, no sneezing, and no hoarseness.  Sore throat. Cardiovascular: No chest pain or discomfort  and no palpitations. Pulmonary: Dyspnea.  No cough  and no wheezing. Gastrointestinal: No dysphagia  and no heartburn.  No nausea, no abdominal pain, and no melena.  No diarrhea. Genitourinary: No dysuria. Endocrine: No muscle weakness. Musculoskeletal: Calf muscle cramps.  No arthralgias  and no soft tissue swelling. Neurological: No dizziness, no fainting, no tingling, and no numbness. Psychological: No anxiety  and no depression. Skin: No rash. 12 system ROS was obtained and reviewed on the Health Maintenance form dated today.  Positive responses are shown above.  If the symptom is not checked, the patient has denied it. Vital Signs   Recorded by Iran Sizer on 23 Jun 2014 02:36 PM BP:126/74,  BSA Calculated: 1.96 ,  BMI Calculated: 28.98 ,  Weight: 185 lb , BMI: 29 kg/m2,  Height: 5 ft 7 in. Physical Exam  APPEARANCE: Well developed, well nourished, in no acute distress.  Normal affect, in a pleasant mood.  Oriented to time, place and person. COMMUNICATION: Harsh and  strained voice, with mild inspiratory stridor. HEAD & FACE:  No scars, lesions or masses of head and face.  Sinuses nontender to palpation.  Salivary glands without mass or tenderness.  Facial strength symmetric.  No facial lesion, scars, or mass. EYES: EOMI with normal primary gaze alignment. Visual acuity grossly intact.  PERRLA EXTERNAL EAR & NOSE: No scars, lesions or masses  EAC & TYMPANIC MEMBRANE:  EAC shows no obstructing lesions or debris and tympanic membranes are normal bilaterally with good movement to insufflation. GROSS HEARING: Normal  TMJ:  Nontender  INTRANASAL EXAM: No polyps or purulence.  NASOPHARYNX: Normal, without lesions. LIPS, TEETH & GUMS: No lip lesions, severely diseased upper and lower didn't patient and normal gums. ORAL CAVITY/OROPHARYNX:  Oral mucosa moist without lesion or asymmetry of the palate, tongue, tonsil or posterior pharynx. LARYNX (mirror exam): Large erythematous mass involving the left hemilarynx, supraglottis. Left vocal cord does not appear to be fully mobile although it somewhat difficult to tell because of the bulk of the mass. Unable to ascertain if it involves the pyriform sinus. NECK:  Supple without adenopathy or mass. THYROID:  Normal with no masses palpable.  NEUROLOGIC:  No gross CN deficits. No nystagmus noted.   LYMPHATIC:  No enlarged nodes palpable. Signature  Electronically signed by : [name]  M.D.; 06/23/2014 2:55 PM EST.

## 2014-07-07 ENCOUNTER — Encounter (HOSPITAL_COMMUNITY): Payer: Self-pay | Admitting: Otolaryngology

## 2014-07-08 ENCOUNTER — Other Ambulatory Visit: Payer: Self-pay | Admitting: Cardiology

## 2014-07-10 NOTE — Telephone Encounter (Signed)
,  Rx(s) sent to pharmacy electronically.  

## 2014-07-13 NOTE — Progress Notes (Signed)
Patient ID: DESTRY DAUBER, male   DOB: 03-30-1955, 59 y.o.   MRN: [id number]     Cardiology Office Note   Date:  07/14/2014   ID:  [name], DOB 1955-12-31, MRN [id number]  PCP:  Imelda Pillow, NP  Cardiologist:   Sanda Klein, MD   Chief Complaint  Patient presents with  . Follow-up    f/u nuclear study results.  Scheduled to see the oncologist Wednesday to go over chemo and radiation.  No cardiac complaints.      History of Present Illness: [name] is a 59 y.o. male who presents for follow up for ischemic cardiomyopathy, CHF, complex history of CAD and history of cardiac arrest following acute MI in 2015. He developed hemoptysis in early June and Brilinta was stopped. Nuclear perfusion study on June 6 showed low risk findings, EF 45%. 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   rolling resection of his laryngeal mass has noticed improvement in his hoarseness and the reduction in dyspnea. He no longer takes diuretics twice daily, but only once.  He is using much less albuterol now.  Brillinta was restarted and he has not had recurrent hemoptysis so far.   There is a plan for a multidisciplinary meeting next week to discuss options for surgical resection versus radiation/chemotherapy.  Multiple events related to CAD in 2015: - CAD S/P CABG in '06 (SVG-LAD).  - January 2015 acute MI, proximal circumflex artery drug-eluting 2.5x16 mm Xience stent at [facility] - May 2015 he went to his PCP for shoulder pain and his Troponin was elevated. He had stenosis in the SVG-LAD and a DES placed on 06/02/13 at [facility]. He was discharged on Plavix.  - 06/09/13 he was found unresponsive at home by his son. CPR was started and EMS called. He was noted to be in VF. He was transported to [facility]. He had shock, respiratory failure, shock liver, and acute renal insufficiency. Cath revealed widely patent saphenous vein graft  to the mid LAD (stented with no evidence of thrombus or restenosis). Widely patent proximal to mid circumflex stent, with haziness in the circumflex proximal to the stent but no significant obstruction. Mild to moderate distal left main stenosis (30-40%) with 80-90% ostial LAD, threatening a small to moderate diagonal territory in the distribution of the wall motion abnormality. The mid LAD off the retrograde segment from the graft insertion site was occluded. Widely patent RCA. A Myoview showed no ischemia. Echo showed left ventricular ejection fraction of 10-15%. His P2Y12 was elevated and he was taken off Plavix and put on Brilinita. Discharged 06/20/13 with a Life Vest.  - 09/27/13 ECHO showed remarkable improvement with LVEF of 45-50%. Wall motion abnormalities were seen in the proximal LAD artery territory as well as the apical septum.  He also has extensive peripheral arterial disease. Occluded left common carotid and moderate right internal carotid artery stenosis. Previous stent the left subclavian artery. Right iliac stent. Bilateral 60-99% renal artery stenosis    Past Medical History  Diagnosis Date  . Carotid stenosis   . Coronary artery disease     s/p CABG 2006; stenting to vein graft 05-2013; NSTEMI 06-2013 in setting of PEA arrest  . Subclavian artery stenosis, left     s/p stenting 2006  . Hypertension   . Hyperlipidemia   . Ischemic cardiomyopathy     echo 06/2013 EF 10-15% (normal 01/2013)  . At risk for sudden  cardiac death, has lifevest at discharge 06/20/2013  . Shock liver 06/20/2013  . Acute MI, troponin > 20, no obvious culprit vessel 06/20/2013  . Hypothermia, induced, post arrest 06/20/2013  . Acute encephalopathy, improved 06/20/2013  . Dizziness 06/20/2013  . Weakness due to cardiac arrest 06/20/2013  . Fever, possible aspiration-treated with antibiotics 06/20/2013  . Tobacco abuse     >100 pack year history   . Dyslipidemia 10/09/2013    [place] Study atorvastatin -eIRB # [id number]  tablet Take 40 mg by mouth daily.   Marland Kitchen PAD (peripheral artery disease) 10/09/2013    Occluded left common carotid and moderate right internal carotid artery stenosis. Previous stent to the left subclavian artery. Right iliac stent. Bilateral 60-99% renal artery stenosis    . Heart failure, acute on chronic, systolic and diastolic 85/92/9244  . Anxiety   . GERD (gastroesophageal reflux disease)   . 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 Angioplasty  05/2013    stent to SVG - LAD vein graft at [facility]  . Coronary artery bypass graft  09/2004    SVG to LAD (at [facility])  . Nm myocar perf wall motion  06/2008    persantine myoview - normal pattern of systolic thickening/wall motion/perfusion;  . Lower extremity arterial doppler  08/2008    right and left ABI - mild arterial insufficiency at rest; R CIA/stent with 50-69% narrowing, L CIA with increased velocities (50-69% diameter reduction)  . Renal artery doppler  05/2008    right and left prox renal arteries with narrowing and increased velocities (60-99% diameter reduction)  . Iliac artery stent  07/24/2008    8x6 Smart nitinol self-expanding stent to origin of ilaci down into external iliac crossing the hypogastric artery (Dr. Adora Fridge)  . Subclavian angiogram Left 08/20/2004    8x75mm Genesis balloon mounted stent  . Left heart catheterization with coronary/graft angiogram N/A 06/15/2013    Procedure: LEFT HEART CATHETERIZATION WITH Beatrix Fetters;  Surgeon: Sinclair Grooms, MD;  Location: [facility] CATH LAB;  Service: Cardiovascular;  Laterality: N/A;  . Cardiac catheterization    . Direct laryngoscopy N/A 07/06/2014    Procedure: DIRECT LARYNGOSCOPY AND ESOPAGOSCOPY WITH BIOPSY;  Surgeon: [name], MD;  Location: [place];  Service: ENT;  Laterality: N/A;     Current Outpatient Prescriptions  Medication Sig Dispense Refill  . albuterol (PROVENTIL HFA;VENTOLIN HFA) 108 (90 BASE) MCG/ACT  inhaler Inhale 2 puffs into the lungs every 6 (six) hours as needed for wheezing or shortness of breath. 1 Inhaler 2  . ALPRAZolam (XANAX) 0.5 MG tablet Take 0.5 mg by mouth See admin instructions. Take 1 tablet by mouth twice daily, allow for additional tablet as needed    . aspirin EC 81 MG tablet Take 81 mg by mouth daily.    Marland Kitchen buPROPion (WELLBUTRIN XL) 300 MG 24 hr tablet Take 300 mg by mouth daily.    . carvedilol (COREG) 12.5 MG tablet TAKE 1 TABLET BY MOUTH TWICE DAILY. 60 tablet 8  . furosemide (LASIX) 40 MG tablet Take 40 mg by mouth daily.     Marland Kitchen HYDROcodone-acetaminophen (NORCO) 7.5-325 MG per tablet Take 1 tablet by mouth every 6 (six) hours as needed for moderate pain. 30 tablet 0  . lisinopril (PRINIVIL,ZESTRIL) 10 MG tablet Take 10mg  in the AM and 20mg  in the PM (Patient taking differently: Take 10 mg by mouth daily. ) 180 tablet 3  . loratadine (  CLARITIN) 10 MG tablet Take 10 mg by mouth daily as needed for allergies.     . Multiple Vitamins-Minerals (MULTIVITAMIN GUMMIES ADULT) CHEW Chew 1 each by mouth 2 (two) times daily.    . nitroGLYCERIN (NITROSTAT) 0.4 MG SL tablet Place 0.4 mg under the tongue every 5 (five) minutes as needed for chest pain.    . pantoprazole (PROTONIX) 40 MG tablet TAKE 1 TABLET BY MOUTH EVERY DAY 90 tablet 3  . potassium chloride SA (K-DUR,KLOR-CON) 20 MEQ tablet Take 1 tablet (20 mEq total) by mouth daily. 90 tablet 3   No current facility-administered medications for this visit.    Allergies:   Review of patient's allergies indicates no known allergies.    Social History:  The patient  reports that he quit smoking about 13 months ago. His smoking use included Cigarettes. He has a 180 pack-year smoking history. He has quit using smokeless tobacco. He reports that he drinks about 12.6 - 25.2 oz of alcohol per week. He reports that he does not use illicit drugs.   Family History:  The patient's family history includes Cancer in his father; Heart Problems  in his sister; Heart attack in his mother; Heart disease in his maternal aunt; Stroke in his mother and sister.    ROS:  Please see the history of present illness.    Otherwise, review of systems positive for none.   All other systems are reviewed and negative.    PHYSICAL EXAM: VS:  BP 110/69 mmHg  Pulse 102  Ht 5\' 7"  (1.702 m)  Wt 178 lb 3.2 oz (80.831 kg)  BMI 27.90 kg/m2 , BMI Body mass index is 27.9 kg/(m^2).  General: Alert, oriented x3, no distress Head: no evidence of trauma, PERRL, EOMI, no exophtalmos or lid lag, no myxedema, no xanthelasma; normal ears, nose and oropharynx Neck: normal jugular venous pulsations and no hepatojugular reflux; brisk carotid pulses without delay and no carotid bruits Chest: clear to auscultation, no signs of consolidation by percussion or palpation, normal fremitus, symmetrical and full respiratory excursions Cardiovascular: normal position and quality of the apical impulse, regular rhythm, normal first and second heart sounds, no  murmurs, rubs or gallops Abdomen: no tenderness or distention, no masses by palpation, no abnormal pulsatility or arterial bruits, normal bowel sounds, no hepatosplenomegaly Extremities: no clubbing, cyanosis or edema; 2+ radial, ulnar and brachial pulses bilaterally; 2+ right femoral, posterior tibial and dorsalis pedis pulses; 2+ left femoral, posterior tibial and dorsalis pedis pulses; no subclavian or femoral bruits Neurological: grossly nonfocal Psych: euthymic mood, full affect   EKG:  EKG is not ordered today.    Recent Labs: 06/22/2014: ALT 26 07/06/2014: BUN 15; Creatinine, Ser 1.23; Hemoglobin 12.5*; Platelets 378; Potassium 3.6; Sodium 138    Lipid Panel No results found for: CHOL, TRIG, HDL, CHOLHDL, VLDL, LDLCALC, LDLDIRECT    Wt Readings from Last 3 Encounters:  07/14/14 178 lb 3.2 oz (80.831 kg)  07/06/14 179 lb (81.194 kg)  06/29/14 179 lb (81.194 kg)    .   ASSESSMENT AND PLAN:  1.  Chronic systolic and diastolic heart failure Appears to be euvolemic, NYHA class II.  Improved dyspnea after resection of partially obstructing laryngeal cancer  2. Ischemic cardiomyopathy, EF 45% Remarkable improvement in left ventricular systolic function following presentation with VF arrest and cardiogenic shock in the setting of non-ST segment elevation myocardial infarction with troponin greater than 20, likely transient thrombosis with reperfusion of this freshly placed stent. Does not require ICD  implantation.  3. CAD-hx of CABG '06, DES LCX 01/2013, DES SVG-LAD 5/21/5 at [facility] to be clopidogrel nonresponsive. Status post drug-eluting stent in May 2015. Brilinta stopped due to laryngeal bleeding.  He has stopped smoking. June 2016 myocardial perfusion study did not show reversible defects.  At this point the risks of dual antiplatelets therapy seem to outweigh the benefit. Stop relented. Continue aspirin 81 mg daily.  4. Dyslipidemia In clinical trial at [facility], he is receiving atorvastatin 20 mg daily.   5. PAD (peripheral artery disease) Occluded left common carotid and moderate right internal carotid artery stenosis. Previous stent to the left subclavian artery. Right iliac stent. Bilateral 60-99% renal artery stenosis. He has bilateral intermittent claudication. Nov 2015 lower extremity arterial Dopplers showed stable findings of widely patent right iliac stent, 50-69 percent stenosis left common iliac artery, less than 50% left superficial femoral artery stenosis, bilateral ABI 1.0.  6.  Laryngeal cancer  if the decision is made for surgical resection, I think he is at low risk for major CV complications.  Current medicines are reviewed at length with the patient today.  The patient does not have concerns regarding medicines.  The following changes have been made:  Stop Brillinta  Labs/ tests ordered today include:  No orders of the defined types were placed in this  encounter.     Patient Instructions  Your physician has recommended you make the following change in your medication: STOP brilinta  Your physician wants you to follow-up in: 6 months with Dr. Sallyanne Kuster. You will receive a reminder letter in the mail two months in advance. If you don't receive a letter, please call our office to schedule the follow-up appointment.      Mikael Spray, MD  07/14/2014 1:31 PM    Sanda Klein, MD, [facility] [phone] office [phone] pager

## 2014-07-14 ENCOUNTER — Ambulatory Visit (INDEPENDENT_AMBULATORY_CARE_PROVIDER_SITE_OTHER): Payer: Medicaid Other | Admitting: Cardiovascular Disease

## 2014-07-14 ENCOUNTER — Encounter: Payer: Self-pay | Admitting: Cardiovascular Disease

## 2014-07-14 VITALS — BP 110/69 | HR 102 | Ht 67.0 in | Wt 178.2 lb

## 2014-07-14 DIAGNOSIS — C329 Malignant neoplasm of larynx, unspecified: Secondary | ICD-10-CM | POA: Diagnosis not present

## 2014-07-14 HISTORY — DX: Malignant neoplasm of larynx, unspecified: C32.9

## 2014-07-14 NOTE — Progress Notes (Signed)
Head and Neck Cancer Location of Tumor / Histology: Left laryngeal  Patient presented months ago with symptoms of:2 months ago patient was being treated for possible ear infection.After 2 rounds of antibiotics symptoms became worse so ct of neck revealed 2 cm supraglottic squamous cell of larynx.  Biopsies of 07/06/14 FINAL DIAGNOSIS Diagnosis Larynx, biopsy, Laryngeal mass - INVASIVE SQUAMOUS CELL CARCINOMA. - SEE COMMENT.  Nutrition Status Yes No Comments  Weight changes? []  [x]    Swallowing concerns? []  [x]    PEG? []  []     Referrals Yes No Comments  Social Work? []  []    Dentistry? []  []    Swallowing therapy? []  []    Nutrition? []  []    Med/Onc? []  []     Safety Issues Yes No Comments  Prior radiation? []  [x]    Pacemaker/ICD? []  [x]    Possible current pregnancy? []  [x]    Is the patient on methotrexate? []  [x]     Tobacco/Marijuana/Snuff/ETOH VWP:VXYIAX 4 ppd over 45 years. Quit 06/09/13.Drinks about 4 beers daily  Past/Anticipated interventions by otolaryngology, if KPV:VZSM laryngeal biopsy 07/06/14  Past/Anticipated interventions by medical oncology, if any:   Current Complaints / other details:Has 10 children ages 31 to 38.  Main complaint is pain on left side of face anterior to ear.Ran out of pain medications of hydrocodone and is now taking tylenol for pain.

## 2014-07-14 NOTE — Patient Instructions (Signed)
Your physician has recommended you make the following change in your medication: STOP brilinta  Your physician wants you to follow-up in: 6 months with Dr. Sallyanne Kuster. You will receive a reminder letter in the mail two months in advance. If you don't receive a letter, please call our office to schedule the follow-up appointment.

## 2014-07-19 ENCOUNTER — Telehealth: Payer: Self-pay | Admitting: *Deleted

## 2014-07-19 NOTE — Telephone Encounter (Signed)
Oncology Nurse Navigator Documentation  Oncology Nurse Navigator Flowsheets 07/19/2014  Referral date to RadOnc/MedOnc -  Navigator Encounter Type Telephone  Patient returned my call.   1. Introduced myself as the oncology nurse navigator that works with Dr. Pablo Ledger to whom he has been referred by Dr. [name]. 2. He confirmed understanding of referral and appt date/time of 07/20/14 10:00/10:30. 3. I briefly explained my role as a navigator, indicated that I would be joining him during his appt. 4. I confirmed understanding of the [facility] location, explained arrival and RadOnc registration process for appt. 5. We discussed his diagnosis, probable treatment plan.  He expressed appreciation for information, stating that he has had no discussion with ENT since bx conducted  07/06/14. 6. I provided my contact information, encouraged him to call with questions/concerns before tomorrow's appt. 7. He verbalized understanding of information provided, expressed appreciation for my call.     Time Spent with Patient [place], RN, BSN, [place] [facility] [place] [phone]

## 2014-07-19 NOTE — Telephone Encounter (Signed)
Oncology Nurse Navigator Documentation  Oncology Nurse Navigator Flowsheets 07/19/2014  Referral date to RadOnc/MedOnc 07/10/2014  Navigator Encounter Type Introductory phone call  Place introductory phone call and to confirm 07/20/14 appt information.  LVM requesting  Time Spent with Patient 15

## 2014-07-20 ENCOUNTER — Encounter: Payer: Self-pay | Admitting: *Deleted

## 2014-07-20 ENCOUNTER — Ambulatory Visit
Admission: RE | Admit: 2014-07-20 | Discharge: 2014-07-20 | Disposition: A | Discharge status: Home / Self Care | Payer: Medicaid Other | Source: Ambulatory Visit | Attending: Radiation Oncology | Admitting: Radiation Oncology

## 2014-07-20 ENCOUNTER — Telehealth: Payer: Self-pay | Admitting: *Deleted

## 2014-07-20 ENCOUNTER — Telehealth: Payer: Self-pay | Admitting: Hematology and Oncology

## 2014-07-20 ENCOUNTER — Encounter: Payer: Self-pay | Admitting: Hematology and Oncology

## 2014-07-20 ENCOUNTER — Ambulatory Visit (HOSPITAL_BASED_OUTPATIENT_CLINIC_OR_DEPARTMENT_OTHER): Payer: Medicaid Other | Admitting: Hematology and Oncology

## 2014-07-20 ENCOUNTER — Ambulatory Visit: Payer: Medicaid Other

## 2014-07-20 VITALS — BP 100/62 | HR 84 | Temp 98.2°F | Resp 18 | Ht 67.0 in | Wt 181.0 lb

## 2014-07-20 VITALS — BP 112/69 | HR 80 | Temp 98.1°F | Resp 20 | Wt 180.6 lb

## 2014-07-20 DIAGNOSIS — C329 Malignant neoplasm of larynx, unspecified: Secondary | ICD-10-CM

## 2014-07-20 DIAGNOSIS — N289 Disorder of kidney and ureter, unspecified: Secondary | ICD-10-CM

## 2014-07-20 DIAGNOSIS — Z510 Encounter for antineoplastic radiation therapy: Secondary | ICD-10-CM | POA: Diagnosis present

## 2014-07-20 DIAGNOSIS — C321 Malignant neoplasm of supraglottis: Secondary | ICD-10-CM

## 2014-07-20 DIAGNOSIS — I429 Cardiomyopathy, unspecified: Secondary | ICD-10-CM | POA: Diagnosis not present

## 2014-07-20 MED ORDER — LARYNGOSCOPY SOLUTION RAD-ONC
15.0000 mL | Freq: Once | TOPICAL | Status: AC
  Administered 2014-07-20: 15 mL via TOPICAL
  Filled 2014-07-20: qty 15

## 2014-07-20 MED ORDER — OXYCODONE HCL 10 MG PO TABS: 10.0000 mg | ORAL_TABLET | ORAL | Status: DC | PRN

## 2014-07-20 NOTE — Progress Notes (Signed)
[facility] NOTE  Patient Care Team: [name], NP as PCP - General [name], RN as Oncology Nurse Navigator [name], MD as Consulting Physician (Radiation Oncology)  CHIEF COMPLAINTS/PURPOSE OF CONSULTATION:   supraglottic cancer T3 N0 MX  HISTORY OF PRESENTING ILLNESS:  [name] 59 y.o. male is here because of newly diagnosed supraglottic cancer According to the patient, the first initial presentation was due to  Hoarseness for the last 2 months. he denies, difficulties with chewing food, swallowing difficulties, painful swallowing, or abnormal weight loss. 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 No regional pathologic adenopathy   07/12/2014 Pathology Results Accession: [id number] biopsy of larynx show squamous cell carcinoma   07/12/2014 Surgery Laryngoscopy revealed a large exophytic tumor arising from the left supraglottic larynx, aryepiglottic fold. It extended from the anterior commissure and the epiglottis anteriorly, to the arytenoid posteriorly. The entire aryepiglottic fold was involved    The patient has significant cardiovascular disease. However, he denies recent chest pain or shortness of breath on exertion. He has intermittent bilateral lower extremity edema periodically.  MEDICAL HISTORY:  Past Medical History  Diagnosis Date  . Carotid stenosis   . Coronary artery disease     s/p CABG 2006; stenting to vein graft 05-2013; NSTEMI 06-2013 in setting of PEA arrest  . Subclavian artery stenosis, left     s/p stenting 2006  . Hypertension   . Hyperlipidemia   . Ischemic cardiomyopathy    echo 06/2013 EF 10-15% (normal 01/2013)  . At risk for sudden cardiac death, has lifevest at discharge 06/20/2013  . Shock liver 06/20/2013  . Acute MI, troponin > 20, no obvious culprit vessel 06/20/2013  . Hypothermia, induced, post arrest 06/20/2013  . Acute encephalopathy, improved 06/20/2013  . Dizziness 06/20/2013  . Weakness due to cardiac arrest 06/20/2013  . Fever, possible aspiration-treated with antibiotics 06/20/2013  . Tobacco abuse     >100 pack year history   . Dyslipidemia 10/09/2013    [place] Study atorvastatin -eIRB # [id number] tablet Take 40 mg by mouth daily.   Marland Kitchen PAD (peripheral artery disease) 10/09/2013    Occluded left common carotid and moderate right internal carotid artery stenosis. Previous stent to the left subclavian artery. Right iliac stent. Bilateral 60-99% renal artery stenosis    . Heart failure, acute on chronic, systolic and diastolic 75/64/3329  . Anxiety   . GERD (gastroesophageal reflux disease)   . 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    SURGICAL HISTORY: Past Surgical History  Procedure Laterality Date  . Angioplasty  05/2013    stent to SVG - LAD vein graft at [facility]  . Coronary artery bypass graft  09/2004    SVG to LAD (at [facility])  . Nm myocar perf wall motion  06/2008    persantine myoview - normal pattern of systolic thickening/wall motion/perfusion;  . Lower extremity arterial doppler  08/2008    right and left ABI - mild arterial insufficiency at rest; R CIA/stent with 50-69% narrowing, L CIA with increased velocities (50-69% diameter reduction)  . Renal artery doppler  05/2008    right and left prox renal arteries with narrowing and increased velocities (  60-99% diameter reduction)  . Iliac artery stent  07/24/2008    8x6 Smart nitinol self-expanding stent to origin of ilaci down into external iliac crossing the hypogastric artery (Dr. Adora Fridge)  . Subclavian angiogram Left 08/20/2004    8x80mm Genesis balloon mounted stent  . Left heart catheterization with  coronary/graft angiogram N/A 06/15/2013    Procedure: LEFT HEART CATHETERIZATION WITH Beatrix Fetters;  Surgeon: Sinclair Grooms, MD;  Location: [facility] CATH LAB;  Service: Cardiovascular;  Laterality: N/A;  . Cardiac catheterization    . Direct laryngoscopy N/A 07/06/2014    Procedure: DIRECT LARYNGOSCOPY AND ESOPAGOSCOPY WITH BIOPSY;  Surgeon: [name], MD;  Location: [facility];  Service: ENT;  Laterality: N/A;    SOCIAL HISTORY: History   Social History  . Marital Status: Married    Spouse Name: N/A  . Number of Children: 22  . Years of Education: 10   Occupational History  . carpenter    Social History Main Topics  . Smoking status: Former Smoker -- 4.00 packs/day for 45 years    Types: Cigarettes    Quit date: 06/09/2013  . Smokeless tobacco: Former Systems developer     Comment: tried for a few weeks  . Alcohol Use: 12.6 - 25.2 oz/week    21-42 Cans of beer per week     Comment: 3-6 12 oz cans daily   . Drug Use: No  . Sexual Activity: Not on file   Other Topics Concern  . Not on file   Social History Narrative    FAMILY HISTORY: Family History  Problem Relation Age of Onset  . Heart disease Maternal Aunt   . Heart attack Mother   . Stroke Mother   . Cancer Father   . Stroke Sister   . Heart Problems Sister     ALLERGIES:  has No Known Allergies.  MEDICATIONS:  Current Outpatient Prescriptions  Medication Sig Dispense Refill  . acetaminophen (TYLENOL) 500 MG tablet Take 500 mg by mouth every 6 (six) hours as needed.    . ALPRAZolam (XANAX) 0.5 MG tablet Take 0.5 mg by mouth See admin instructions. Take 1 tablet by mouth twice daily, allow for additional tablet as needed    . aspirin EC 81 MG tablet Take 81 mg by mouth daily.    Marland Kitchen buPROPion (WELLBUTRIN XL) 300 MG 24 hr tablet Take 300 mg by mouth daily.    . carvedilol (COREG) 12.5 MG tablet TAKE 1 TABLET BY MOUTH TWICE DAILY. 60 tablet 8  . furosemide (LASIX) 40 MG tablet Take 40 mg by mouth daily.     Marland Kitchen  lisinopril (PRINIVIL,ZESTRIL) 10 MG tablet Take 10mg  in the AM and 20mg  in the PM (Patient taking differently: Take 10 mg by mouth daily. ) 180 tablet 3  . Multiple Vitamins-Minerals (MULTIVITAMIN GUMMIES ADULT) CHEW Chew 1 each by mouth 2 (two) times daily.    . nitroGLYCERIN (NITROSTAT) 0.4 MG SL tablet Place 0.4 mg under the tongue every 5 (five) minutes as needed for chest pain.    Marland Kitchen oxyCODONE 10 MG TABS Take 1 tablet (10 mg total) by mouth every 4 (four) hours as needed for severe pain. 60 tablet 0  . pantoprazole (PROTONIX) 40 MG tablet TAKE 1 TABLET BY MOUTH EVERY DAY 90 tablet 3  . potassium chloride SA (K-DUR,KLOR-CON) 20 MEQ tablet Take 1 tablet (20 mEq total) by mouth daily. 90 tablet 3   No current facility-administered medications for this visit.    REVIEW OF  SYSTEMS:   Constitutional: Denies fevers, chills or abnormal night sweats Eyes: Denies blurriness of vision, double vision or watery eyes Ears, nose, mouth, throat, and face: Denies mucositis or sore throat Respiratory: Denies cough, dyspnea or wheezes Cardiovascular: Denies palpitation, chest discomfort or lower extremity swelling Gastrointestinal:  Denies nausea, heartburn or change in bowel habits Skin: Denies abnormal skin rashes Lymphatics: Denies new lymphadenopathy or easy bruising Neurological:Denies numbness, tingling or new weaknesses Behavioral/Psych: Mood is stable, no new changes  All other systems were reviewed with the patient and are negative.  PHYSICAL EXAMINATION: ECOG PERFORMANCE STATUS: 0 - Asymptomatic  Filed Vitals:   07/20/14 1348  BP: 100/62  Pulse: 84  Temp: 98.2 F (36.8 C)  Resp: 18   Filed Weights   07/20/14 1348  Weight: 181 lb (82.101 kg)    GENERAL:alert, no distress and comfortable SKIN: skin color, texture, turgor are normal, no rashes or significant lesions EYES: normal, conjunctiva are pink and non-injected, sclera clear OROPHARYNX:no exudate, no erythema and lips, buccal  mucosa, and tongue normal  NECK: supple, thyroid normal size, non-tender, without nodularity LYMPH:  no palpable lymphadenopathy in the cervical, axillary or inguinal LUNGS: clear to auscultation and percussion with normal breathing effort HEART: regular rate & rhythm and no murmurs and no lower extremity edema ABDOMEN:abdomen soft, non-tender and normal bowel sounds Musculoskeletal:no cyanosis of digits and no clubbing  PSYCH: alert & oriented x 3 with fluent speech. Noted hoarseness NEURO: no focal motor/sensory deficits  LABORATORY DATA:  I have reviewed the data as listed Lab Results  Component Value Date   WBC 11.2* 07/06/2014   HGB 12.5* 07/06/2014   HCT 37.6* 07/06/2014   MCV 92.2 07/06/2014   PLT 378 07/06/2014   Lab Results  Component Value Date   NA 138 07/06/2014   K 3.6 07/06/2014   CL 94* 07/06/2014   CO2 32 07/06/2014    RADIOGRAPHIC STUDIES: I reviewed the imaging study I have personally reviewed the radiological images as listed and agreed with the findings in the report. Dg Chest 2 View  06/22/2014   CLINICAL DATA:  Hemoptysis for 4 weeks, recently finished antibiotics for left ear infection  EXAM: CHEST  2 VIEW  COMPARISON:  01/12/2014  FINDINGS: Heart size and vascular pattern normal. Status post CABG. No consolidation or effusion. Mild hyperinflation suggesting COPD. Diffuse osteopenia.  IMPRESSION: No active cardiopulmonary disease.   Electronically Signed   By: Skipper Cliche M.D.   On: 06/22/2014 17:24   Ct Soft Tissue Neck W Contrast  06/22/2014   CLINICAL DATA:  Sore throat and hemoptysis.  EXAM: CT NECK WITH CONTRAST  TECHNIQUE: Multidetector CT imaging of the neck was performed using the standard protocol following the bolus administration of intravenous contrast.  CONTRAST:  170mL OMNIPAQUE IOHEXOL 300 MG/ML  SOLN  COMPARISON:  None.  FINDINGS: Pharynx and larynx: There is a large supraglottic mass, originating on the LEFT, with extension to the RIGHT  across the anterior commissure. Dimensions are 23 x 25 x 27 mm. There is significant compromise of the airway. LEFT arytenoid sclerosis. No definite thyroid cartilage destruction. 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 No other significant pharyngeal abnormalities.  Salivary glands: Normal.  Thyroid: Normal.  Lymph nodes: Normal.  Vascular: Atherosclerosis, with significant plaque of both carotid bifurcations, greater on the RIGHT. LEFT subclavian stent.  Limited intracranial: Negative.  Visualized orbits: Negative.  Mastoids and visualized paranasal sinuses: Negative.  Skeleton: Spondylosis throughout.  No worrisome osseous lesion.  Upper chest: No lung apex lesion.  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 No regional pathologic adenopathy. LEFT arytenoid sclerosis.  Significant carotid atherosclerosis.   Electronically Signed   By: Rolla Flatten M.D.   On: 06/22/2014 20:23    ASSESSMENT:  Newly diagnosed squamous cell carcinoma of the Head & Neck, HPV N/A  PLAN:  Stage of the disease is to be determined, a PET/CT scan has been ordered.  Squamous cell carcinoma of larynx Clinically, he appears to have locally advanced lesion. Per discussion at the recent ENT tumor board and with his radiation oncologist, chemotherapy is recommended. The patient had multiple co-morbidities including mild cardiomyopathy, mild renal dysfunction and hearing impairment.  I elected to choose the use of cetuximab weekly in conjunction with radiation as radio-sensitizing agent.  I recommend port placement prior to the start of treatment.    I will see him back in 2 weeks for further assessment and chemotherapy consent  Orders Placed This Encounter  Procedures  . IR Fluoro Guide CV Line Right    Indicate type of CVC ordering    Standing Status: Future     Number of Occurrences:      Standing Expiration Date: 09/20/2015    Order  Specific Question:  Reason for exam:    Answer:  laryngeal ca, need chemo    Order Specific Question:  Preferred Imaging Location?    Answer:  [facility]  . CBC with Differential/Platelet    Standing Status: Future     Number of Occurrences:      Standing Expiration Date: 08/24/2015  . Comprehensive metabolic panel    Standing Status: Future     Number of Occurrences:      Standing Expiration Date: 08/24/2015  . Magnesium    Standing Status: Future     Number of Occurrences:      Standing Expiration Date: 08/24/2015    All questions were answered. The patient knows to call the clinic with any problems, questions or concerns. I spent 55 minutes counseling the patient face to face. The total time spent in the appointment was 60 minutes and more than 50% was on counseling.     Craig Beach, [place], MD 07/20/2014 5:30 PM

## 2014-07-20 NOTE — Addendum Note (Signed)
Encounter addended by: Norm Salt, RN on: 07/20/2014  1:24 PM<BR>     Documentation filed: Charges VN

## 2014-07-20 NOTE — Progress Notes (Signed)
Please see the Nurse Progress Note in the MD Initial Consult Encounter for this patient. 

## 2014-07-20 NOTE — Progress Notes (Addendum)
Radiation Oncology         [phone]) [phone] ________________________________  Initial outpatient Consultation - Date: 07/20/2014   Name: [name] MRN: [id number]   DOB: 1955-02-21  REFERRING PHYSICIAN: [name], MD  DIAGNOSIS AND STAGE: TIIIN0 squamous cell carcinoma of the larynx  HISTORY OF PRESENT ILLNESS::[name] is a 59 y.o. male  presenting with a multiple month hx of hoarseness, left ear pain, and hemoptysis. He was seen in the ER and a CT scan was performed and showed a left supraglottic mass. He underwent laryngoscopy and biopsy on 6/23 which revealed a large exophytic tumor arising from the aryepiglottic fold extending from the anterior commissure to the arytenoid and involved the entire aryepiglottic fold. The left vocal cord and subglottis were unable to be seen. Multiple biopsies were performed which revealed squamous cell carcinoma. He did not have a PET scan. He presents today for my opinion regarding radiation treatment for the management of his disease.  The pt admits that he has smoked for 40 years and quit about 1.5 years ago due to a heart attack. He still drinks 2-4 beers per day. The pt states he is taking 2 tablets of Oxycodone 7.5/325 mg at a time which decreases his pain to about a 2. He states that 1 tablet does not knock down his pain. He is not on GI prophylaxis. He is accompanied by his 2 daughters. He works as a Chief Strategy Officer at [employer]   PREVIOUS RADIATION THERAPY: No  Past medical, social and family history were reviewed in the electronic chart. Review of symptoms was reviewed in the electronic chart. Medications were reviewed in the electronic chart.   PHYSICAL EXAM:  Filed Vitals:   07/20/14 0956  BP: 112/69  Pulse: 80  Temp: 98.1 F (36.7 C)  Resp: 20  .180 lb 9.6 oz (81.92 kg). Pleasant gentleman who appears his age. Alert and oriented times three. No palpable cervical, supraclavicular or submandibular adenopathy.    PROCEDURE NOTE: After anesthetizing the nasal cavity with topical lidocaine and phenylephrine, the flexible endoscope was introduced and passed through the nasal cavity. Has a large exophytic mass involving the left epiglottic fold. The left vocal cord unable to be visualized. R vocal cord approximates at midline. The mass does the fill the supraepiglotic space. Impaired mobility of the left vocal cord. Vallecula is clear as is the preepiglottic space.  IMPRESSION: T3N0 squamous cell carcinoma of the larynx  PLAN: I discussed larynx preservation using chemotherapy and radiation for 7 weeks. We discussed the goal of treatment to preserve his voice with salvage laryngectomy for failure. We will proceed with CT sim and the making of a mask. I would like CT sim performed next week followed by a PET scan for target delineation and treatment planning as well as staging to be performed in his treatment mask. He will need to be referred to a dietitian, physical therapy, speech therapy, and social work. We will also refer him to medical oncology later today. I do not believe he will require a feeding tube. We discussed possible side effect, including but not limited to: fatigue, skin redness and darkening, a sore throat, hypothyroidism, permanent or tempoary dysphagia,  weight loss, and lymphedema. He signed a formed consent and agreed to proceed forward.  I have increased his Oxycodone to 10 mg every 4 hours for pain.  I spent 60 minutes  face to face with the patient and more than 50% of that time was spent in  counseling and/or coordination of care.  This document serves as a record of services personally performed by [name], MD. It was created on her behalf by Darcus Austin, a trained medical scribe. The creation of this record is based on the scribe's personal observations and the provider's statements to them. This document has been checked and approved by the attending provider.    ------------------------------------------------  [name], MD

## 2014-07-20 NOTE — Assessment & Plan Note (Signed)
Clinically, he appears to have locally advanced lesion. Per discussion at the recent ENT tumor board and with his radiation oncologist, chemotherapy is recommended. The patient had multiple co-morbidities including mild cardiomyopathy, mild renal dysfunction and hearing impairment.  I elected to choose the use of cetuximab weekly in conjunction with radiation as radio-sensitizing agent.  I recommend port placement prior to the start of treatment.

## 2014-07-20 NOTE — Telephone Encounter (Signed)
Per MD add appt for today New Patient.

## 2014-07-20 NOTE — Telephone Encounter (Signed)
per pof to sch pt appt-gave pt copy of avs-sent MW email to sch trmt-pt to get complete avs on 7/12

## 2014-07-20 NOTE — Telephone Encounter (Signed)
Per staff message and POF I have scheduled appts. Advised scheduler of appts. JMW  

## 2014-07-21 ENCOUNTER — Telehealth: Payer: Self-pay | Admitting: *Deleted

## 2014-07-21 NOTE — Progress Notes (Signed)
Oncology Nurse Navigator Documentation  Oncology Nurse Navigator Flowsheets 07/20/2014  Referral date to RadOnc/MedOnc -  Navigator Encounter Type Initial MedOnc  Met with patient during initial consult with Dr. Alvy Bimler.     In support of Dr. Calton Dach discussion, I provided [facility] educational information sheet. Assisted with post-consult appt scheduling. Showed them the location of Dr. Ritta Slot office and [facility] Radiology as reference for future appts, including arrival procedure for these appts.   They verbalized understanding of information provided.      [name], RN, BSN, [place] at [place] [phone]

## 2014-07-21 NOTE — Telephone Encounter (Signed)
CALLED PATIENT TO INFORM OF APPTS. FOR PET, PT, SPEECH THERAPY AND BARBARA NEFF APPT., SPOKE WITH PATIENT AND HE IS AWARE OF THESE APPTS.

## 2014-07-21 NOTE — Progress Notes (Signed)
  Oncology Nurse Navigator Documentation  Oncology Nurse Navigator Flowsheets 07/20/2014  Referral date to RadOnc/MedOnc -  Navigator Encounter Type Initial RadOnc  Met with patient during initial consult with Dr. Pablo Ledger.  He was accompanied by his dtrs Amy and Kasey.   1. I further introduced myself as his/family Navigator, explained my role as a member of the Care Team. 2. Provided New Patient Information packet:  Contact information for physician, this navigator and other members of the Care Team.  Advance Directive information ([place] blue pamphlet)  Fall Prevention Patient Safety Plan  Appointment Guideline  ACS referral form.  [place] with highlight of [place] 3. Provided introductory explanation of radiation treatment including SIM planning and fitting of head mask, showed them example of mask.   4. Provided a tour of SIM and [place] areas, explained treatment and arrival procedures.   I encouraged them to contact me with questions/concerns as appts/procedures proceed. They verbalized understanding of information provided.          Barriers/Navigation Needs No barriers at this time    [name], RN, BSN, [place] at [place] [phone]

## 2014-07-21 NOTE — Telephone Encounter (Signed)
Called patient to inform of appts., lvm for a return call

## 2014-07-24 ENCOUNTER — Telehealth: Payer: Self-pay | Admitting: *Deleted

## 2014-07-24 NOTE — Telephone Encounter (Signed)
  Oncology Nurse Navigator Documentation   Navigator Encounter Type: Telephone (07/24/14 1054)   Patient called with request for appt information in calendar format.  I indicated I would print currently scheduled appts in EPIC calendar format and have available for him to pick-up at [facility] front desk.   [name], RN, BSN, [place] at [place] [phone]

## 2014-07-25 ENCOUNTER — Encounter: Payer: Self-pay | Admitting: *Deleted

## 2014-07-25 ENCOUNTER — Ambulatory Visit: Payer: Medicaid Other | Attending: Radiation Oncology | Admitting: Physical Therapy

## 2014-07-25 ENCOUNTER — Other Ambulatory Visit: Payer: Self-pay

## 2014-07-25 ENCOUNTER — Ambulatory Visit: Payer: Medicaid Other | Admitting: Physical Therapy

## 2014-07-25 DIAGNOSIS — R293 Abnormal posture: Secondary | ICD-10-CM

## 2014-07-25 NOTE — Therapy (Signed)
[place], [place], [address] Phone: [phone]   Fax:  [phone]  Physical Therapy Evaluation  Patient Details  Name: [name] MRN: [id number] Date of Birth: 09-Jul-1955 Referring Provider:  [name], MD  Encounter Date: 07/25/2014      PT End of Session - 07/25/14 1339    Visit Number 1  on time only   PT Start Time 0930   PT Stop Time 1015   PT Time Calculation (min) 45 min   Activity Tolerance Patient tolerated treatment well   Behavior During Therapy [facility] for tasks assessed/performed      Past Medical History  Diagnosis Date  . Carotid stenosis   . Coronary artery disease     s/p CABG 2006; stenting to vein graft 05-2013; NSTEMI 06-2013 in setting of PEA arrest  . Subclavian artery stenosis, left     s/p stenting 2006  . Hypertension   . Hyperlipidemia   . Ischemic cardiomyopathy     echo 06/2013 EF 10-15% (normal 01/2013)  . At risk for sudden cardiac death, has lifevest at discharge 06/20/2013  . Shock liver 06/20/2013  . Acute MI, troponin > 20, no obvious culprit vessel 06/20/2013  . Hypothermia, induced, post arrest 06/20/2013  . Acute encephalopathy, improved 06/20/2013  . Dizziness 06/20/2013  . Weakness due to cardiac arrest 06/20/2013  . Fever, possible aspiration-treated with antibiotics 06/20/2013  . Tobacco abuse     >100 pack year history   . Dyslipidemia 10/09/2013    [place] Study atorvastatin -eIRB # [id number] tablet Take 40 mg by mouth daily.   Marland Kitchen PAD (peripheral artery disease) 10/09/2013    Occluded left common carotid and moderate right internal carotid artery stenosis. Previous stent to the left subclavian artery. Right iliac stent. Bilateral 60-99% renal artery stenosis    . Heart failure, acute on chronic, systolic and diastolic 40/10/2723  . Anxiety   . GERD (gastroesophageal reflux disease)   . 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 Angioplasty  05/2013    stent to SVG - LAD vein graft at [facility]  . Coronary artery bypass graft  09/2004    SVG to LAD (at [facility])  . Nm myocar perf wall motion  06/2008    persantine myoview - normal pattern of systolic thickening/wall motion/perfusion;  . Lower extremity arterial doppler  08/2008    right and left ABI - mild arterial insufficiency at rest; R CIA/stent with 50-69% narrowing, L CIA with increased velocities (50-69% diameter reduction)  . Renal artery doppler  05/2008    right and left prox renal arteries with narrowing and increased velocities (60-99% diameter reduction)  . Iliac artery stent  07/24/2008    8x6 Smart nitinol self-expanding stent to origin of ilaci down into external iliac crossing the hypogastric artery (Dr. Adora Fridge)  . Subclavian angiogram Left 08/20/2004    8x40mm Genesis balloon mounted stent  . Left heart catheterization with coronary/graft angiogram N/A 06/15/2013    Procedure: LEFT HEART CATHETERIZATION WITH Beatrix Fetters;  Surgeon: Sinclair Grooms, MD;  Location: [facility] CATH LAB;  Service: Cardiovascular;  Laterality: N/A;  . Cardiac catheterization    . Direct laryngoscopy N/A 07/06/2014    Procedure: DIRECT LARYNGOSCOPY AND ESOPAGOSCOPY WITH BIOPSY;  Surgeon: [name], MD;  Location: [place];  Service: ENT;  Laterality: N/A;    There were no vitals filed for this visit.  Visit Diagnosis:  Abnormal posture      Subjective Assessment - 07/25/14 0941    Subjective "I dread it"  regarding chemo therapy    Pertinent History pt with treatment for earache with surgery to get rid of visible cancer of larynx and is now preparing for chemo and radiation He reports open heart surgery, 3 hear atttacks and 4 stents.    Currently in Pain? Yes   Pain Score 9    Pain Location Ear  "If feels like I have an inner ear infection, but its not the ear"    Pain Orientation Left            [facility] PT Assessment - 07/25/14 0001     Assessment   Medical Diagnosis cancer of larynx    Balance Screen   Has the patient fallen in the past 6 months No   Has the patient had a decrease in activity level because of a fear of falling?  No   Is the patient reluctant to leave their home because of a fear of falling?  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    Leisure likes to walk    Cognition   Overall Cognitive Status Within Functional Limits for tasks assessed   Functional Tests   Functional tests Sit to Stand   Sit to Stand   Comments 9  from low mat    Posture/Postural Control   Posture/Postural Control Postural limitations   Postural Limitations Rounded Shoulders;Forward head   AROM   Right Shoulder Flexion 143 Degrees   Right Shoulder ABduction 132 Degrees   Left Shoulder Flexion 156 Degrees   Left Shoulder ABduction 145 Degrees   Cervical Flexion 65   Cervical Extension 38   Cervical - Right Side Bend 34  pain   Cervical - Left Side Bend --  pain   Cervical - Right Rotation 35   Cervical - Left Rotation 25           LYMPHEDEMA/ONCOLOGY QUESTIONNAIRE - 07/25/14 0959    Head and Neck   4 cm superior to sternal notch around neck 41.5 cm   6 cm superior to sternal notch around neck 42 cm   8 cm superior to sternal notch around neck 43.5 cm                        PT Education - 07/25/14 1339    Education provided Yes   Education Details neck range of motion, posture tips, lymphedema risk   Person(s) Educated Patient   Methods Explanation;Handout   Comprehension Verbalized understanding                 Head and Neck Clinic Goals - 07/25/14 1342    Patient will be able to verbalize understanding of a home exercise program for cervical range of motion,  posture, and walking.    Status Achieved   Patient will be able to verbalize understanding of proper sitting and standing posture.    Status Achieved   Patient will be able to verbalize understanding of lymphedema risk and availability of treatment for this condition.    Status Achieved           Plan -  07/25/14 1340    Clinical Impression Statement 65 you male with forward head, rounded shoulders who is preparing for treatment of cancer of larynx.  He acknowledged all information and will try to do neck and posture exrcise and continue walking through treatment   PT Frequency One time visit   PT Next Visit Plan no follow up at this time, but pt will call if he has questions   Consulted and Agree with Plan of Care Patient         Problem List Patient Active Problem List   Diagnosis Date Noted  . 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 Heart failure, acute on chronic, systolic and diastolic 15/94/5859  . CAD (coronary artery disease), autologous vein bypass graft 10/09/2013  . Dyslipidemia 10/09/2013  . PAD (peripheral artery disease) 10/09/2013  . At risk for sudden cardiac death, has lifevest at discharge 06/20/2013  . Ischemic cardiomyopathy, EF 45% 06/20/2013  . Shock liver 06/20/2013  . Acute MI, troponin > 20, no obvious culprit vessel, NSTEMI anterior wall 06/20/2013  . Hypothermia, induced, post arrest, initially then stopped 06/20/2013  . Acute encephalopathy, improved 06/20/2013  . Dizziness 06/20/2013  . Weakness due to cardiac arrest 06/20/2013  . Fever, possible aspiration-treated with antibiotics 06/20/2013  . Anemia 06/17/2013  . NSVT (nonsustained ventricular tachycardia) 06/17/2013  . CAD-hx of CABG '06, DES LCX 01/2013, DES SVG-LAD 5/21/5 at [facility] 06/17/2013  . Acute respiratory failure with hypoxia, intubated on admit, self-extubatedn 06/13/13 06/10/2013  . Cardiogenic shock 06/10/2013  . Cardiogenic pulmonary edema, improved  06/10/2013  . Cardiac arrest, V fib on initial tracing shocked to SR this was followed by PEA, successfully resuscitated  06/09/2013   Donato Heinz. Owens Shark, PT  07/25/2014, 1:43 PM  [place], [place], [address] Phone: [phone]   Fax:  [phone]

## 2014-07-26 ENCOUNTER — Encounter: Payer: Self-pay | Admitting: *Deleted

## 2014-07-26 ENCOUNTER — Ambulatory Visit
Admission: RE | Admit: 2014-07-26 | Discharge: 2014-07-26 | Disposition: A | Discharge status: Home / Self Care | Payer: Medicaid Other | Source: Ambulatory Visit | Attending: Radiation Oncology | Admitting: Radiation Oncology

## 2014-07-26 VITALS — BP 102/66 | HR 100 | Temp 98.3°F | Resp 20 | Ht 67.0 in | Wt 181.4 lb

## 2014-07-26 DIAGNOSIS — C329 Malignant neoplasm of larynx, unspecified: Secondary | ICD-10-CM

## 2014-07-26 DIAGNOSIS — Z510 Encounter for antineoplastic radiation therapy: Secondary | ICD-10-CM | POA: Diagnosis not present

## 2014-07-26 MED ORDER — SODIUM CHLORIDE 0.9 % IJ SOLN
10.0000 mL | Freq: Once | INTRAMUSCULAR | Status: AC
Start: 2014-07-26 — End: 2014-07-26
  Administered 2014-07-26: 10 mL via INTRAVENOUS

## 2014-07-26 NOTE — Addendum Note (Signed)
Encounter addended by: [name], RN on: 07/26/2014  5:13 PM<BR>     Documentation filed: Lines/Drains/Airways Properties Editor

## 2014-07-26 NOTE — Progress Notes (Addendum)
patient arrived ambulatory,  Vitals wnl,  Patient gave name and dob as identification, pain 5 on 1/10 scale  Left side neck, takes hydrocodone prn pain, IV catheter   Right hand, #24, x 1 attempt, excellent blood return, flushed wioth 49ml 0.9normal saline , patient tolertaed well, not diabetic, NKA, labs 07/06/14: BUN=15, CR1.23, patient stated he will get a port a cath Friday , called Aaron Edelman, RT CT sim ,patient is ready  And paged [name], h/n navigator  Daughter and son with patient 1:23 PM  1:23 PM

## 2014-07-26 NOTE — Progress Notes (Signed)
[facility] Simulation/Treatment Planning/IMRT note   KYANDRE OKRAY  [id number] 07/26/2014  1955/06/06  Squamous cell carcinoma of larynx   Staging form: Larynx - Supraglottis, AJCC 7th Edition     Clinical stage from 07/20/2014: T3, N0 - Signed by [name], MD on 07/20/2014   CONSENT VERIFIED:yes   SET UP: Patient is set-up supine   IMMOBILIZATION: The following immobilization is used:Aquaplast Mask   NARRATIVE:The patient was brought to the [facility].  Identity was confirmed.  All relevant records and images related to the planned course of therapy were reviewed.  Then, the patient was positioned in a stable reproducible clinical set-up for radiation therapy using an aquaplast mask.  IV contrast was administered and CT images were obtained.  Skin markings were placed.  The CT images were loaded into the planning software where the target and avoidance structures were contoured.  The patient's previous PET scan was fused with the planning CT to aid in target delineation. The radiation prescription was entered and confirmed.   TREATMENT PLANNING NOTE:  Treatment planning then occurred. I have requested : Intensity Modulated Radiotherapy (IMRT) is medically necessary for this case for parotid sparing which results in a gain of quality of life by preservation of salivary gland function. In addition the tumor volume is in close proximity to the spinal cord.   I personally supervised and approved the construction of a unique IMRT devices  Special treatment procedure will be performed as the patient will be receiving concurrent chemotherapy.  He will be carefully monitored for increased toxicities of treatment including weekly labs and nutrition follow up.   This document serves as a record of services personally performed by [name], MD. It was created on her behalf by Darcus Austin, a trained medical scribe. The creation of  this record is based on the scribe's personal observations and the provider's statements to them. This document has been checked and approved by the attending provider.

## 2014-07-27 ENCOUNTER — Encounter (HOSPITAL_COMMUNITY): Payer: Self-pay

## 2014-07-27 ENCOUNTER — Encounter (HOSPITAL_COMMUNITY)
Admission: RE | Admit: 2014-07-27 | Discharge: 2014-07-27 | Disposition: A | Discharge status: Home / Self Care | Payer: Medicaid Other | Source: Ambulatory Visit | Attending: Radiation Oncology | Admitting: Radiation Oncology

## 2014-07-27 ENCOUNTER — Other Ambulatory Visit: Payer: Self-pay | Admitting: Physician Assistant

## 2014-07-27 ENCOUNTER — Encounter: Payer: Self-pay | Admitting: *Deleted

## 2014-07-27 ENCOUNTER — Other Ambulatory Visit: Payer: Self-pay | Admitting: Radiology

## 2014-07-27 DIAGNOSIS — C329 Malignant neoplasm of larynx, unspecified: Secondary | ICD-10-CM | POA: Insufficient documentation

## 2014-07-27 LAB — GLUCOSE, CAPILLARY: Glucose-Capillary: 111 mg/dL — ABNORMAL HIGH (ref 65–99)

## 2014-07-27 MED ORDER — FLUDEOXYGLUCOSE F - 18 (FDG) INJECTION
8.6000 | Freq: Once | INTRAVENOUS | Status: AC | PRN
Start: 2014-07-27 — End: 2014-07-27
  Administered 2014-07-27: 8.6 via INTRAVENOUS

## 2014-07-27 NOTE — Progress Notes (Signed)
[place] Social Work  Clinical Social Work was referred by  Navigator to review and complete healthcare advance directives.  Clinical Social Worker met with patient and three of patient's children in [place] office.  The patient designated daughter Amy as their primary healthcare agent and son as their secondary agent.  Patient also completed healthcare living will.    Clinical Social Worker notarized documents and made copies for patient/family. Clinical Social Worker will send documents to medical records to be scanned into patient's chart. Clinical Social Worker encouraged patient/family to contact with any additional questions or concerns.  Polo Riley, MSW, [place] Worker [facility] [phone]

## 2014-07-28 ENCOUNTER — Other Ambulatory Visit: Payer: Self-pay | Admitting: Hematology and Oncology

## 2014-07-28 ENCOUNTER — Encounter (HOSPITAL_COMMUNITY): Payer: Self-pay

## 2014-07-28 ENCOUNTER — Ambulatory Visit (HOSPITAL_COMMUNITY)
Admission: RE | Admit: 2014-07-28 | Discharge: 2014-07-28 | Disposition: A | Discharge status: Home / Self Care | Payer: Medicaid Other | Source: Ambulatory Visit | Attending: Hematology and Oncology | Admitting: Hematology and Oncology

## 2014-07-28 DIAGNOSIS — C329 Malignant neoplasm of larynx, unspecified: Secondary | ICD-10-CM | POA: Diagnosis present

## 2014-07-28 DIAGNOSIS — Z87891 Personal history of nicotine dependence: Secondary | ICD-10-CM | POA: Insufficient documentation

## 2014-07-28 DIAGNOSIS — Z955 Presence of coronary angioplasty implant and graft: Secondary | ICD-10-CM | POA: Insufficient documentation

## 2014-07-28 DIAGNOSIS — K219 Gastro-esophageal reflux disease without esophagitis: Secondary | ICD-10-CM | POA: Diagnosis not present

## 2014-07-28 DIAGNOSIS — Z951 Presence of aortocoronary bypass graft: Secondary | ICD-10-CM | POA: Diagnosis not present

## 2014-07-28 DIAGNOSIS — E785 Hyperlipidemia, unspecified: Secondary | ICD-10-CM | POA: Diagnosis not present

## 2014-07-28 DIAGNOSIS — F419 Anxiety disorder, unspecified: Secondary | ICD-10-CM | POA: Diagnosis not present

## 2014-07-28 DIAGNOSIS — I2510 Atherosclerotic heart disease of native coronary artery without angina pectoris: Secondary | ICD-10-CM | POA: Insufficient documentation

## 2014-07-28 DIAGNOSIS — I252 Old myocardial infarction: Secondary | ICD-10-CM | POA: Diagnosis not present

## 2014-07-28 DIAGNOSIS — I739 Peripheral vascular disease, unspecified: Secondary | ICD-10-CM | POA: Diagnosis not present

## 2014-07-28 DIAGNOSIS — I255 Ischemic cardiomyopathy: Secondary | ICD-10-CM | POA: Insufficient documentation

## 2014-07-28 DIAGNOSIS — I10 Essential (primary) hypertension: Secondary | ICD-10-CM | POA: Insufficient documentation

## 2014-07-28 DIAGNOSIS — Z7982 Long term (current) use of aspirin: Secondary | ICD-10-CM | POA: Diagnosis not present

## 2014-07-28 LAB — CBC WITH DIFFERENTIAL/PLATELET
Basophils Absolute: 0 10*3/uL (ref 0.0–0.1)
Basophils Relative: 0 % (ref 0–1)
EOS ABS: 0.2 10*3/uL (ref 0.0–0.7)
EOS PCT: 2 % (ref 0–5)
HCT: 32.9 % — ABNORMAL LOW (ref 39.0–52.0)
HEMOGLOBIN: 10.7 g/dL — AB (ref 13.0–17.0)
LYMPHS ABS: 2.8 10*3/uL (ref 0.7–4.0)
LYMPHS PCT: 24 % (ref 12–46)
MCH: 29.8 pg (ref 26.0–34.0)
MCHC: 32.5 g/dL (ref 30.0–36.0)
MCV: 91.6 fL (ref 78.0–100.0)
Monocytes Absolute: 1.3 10*3/uL — ABNORMAL HIGH (ref 0.1–1.0)
Monocytes Relative: 11 % (ref 3–12)
NEUTROS ABS: 7.5 10*3/uL (ref 1.7–7.7)
NEUTROS PCT: 63 % (ref 43–77)
Platelets: 379 10*3/uL (ref 150–400)
RBC: 3.59 MIL/uL — ABNORMAL LOW (ref 4.22–5.81)
RDW: 14.6 % (ref 11.5–15.5)
WBC: 11.8 10*3/uL — ABNORMAL HIGH (ref 4.0–10.5)

## 2014-07-28 LAB — APTT: APTT: 32 s (ref 24–37)

## 2014-07-28 LAB — PROTIME-INR
INR: 0.94 (ref 0.00–1.49)
PROTHROMBIN TIME: 12.8 s (ref 11.6–15.2)

## 2014-07-28 MED ORDER — MIDAZOLAM HCL 2 MG/2ML IJ SOLN
INTRAMUSCULAR | Status: AC
  Filled 2014-07-28: qty 6

## 2014-07-28 MED ORDER — FENTANYL CITRATE (PF) 100 MCG/2ML IJ SOLN
INTRAMUSCULAR | Status: AC | PRN
  Administered 2014-07-28: 25 ug via INTRAVENOUS
  Administered 2014-07-28: 50 ug via INTRAVENOUS

## 2014-07-28 MED ORDER — FENTANYL CITRATE (PF) 100 MCG/2ML IJ SOLN
INTRAMUSCULAR | Status: AC
  Filled 2014-07-28: qty 4

## 2014-07-28 MED ORDER — CEFAZOLIN SODIUM-DEXTROSE 2-3 GM-% IV SOLR
INTRAVENOUS | Status: AC
  Filled 2014-07-28: qty 50

## 2014-07-28 MED ORDER — LIDOCAINE HCL 1 % IJ SOLN
INTRAMUSCULAR | Status: AC
  Filled 2014-07-28: qty 20

## 2014-07-28 MED ORDER — CEFAZOLIN SODIUM-DEXTROSE 2-3 GM-% IV SOLR
2.0000 g | INTRAVENOUS | Status: AC
  Administered 2014-07-28: 2 g via INTRAVENOUS

## 2014-07-28 MED ORDER — LIDOCAINE-EPINEPHRINE 2 %-1:100000 IJ SOLN
INTRAMUSCULAR | Status: AC
  Filled 2014-07-28: qty 1

## 2014-07-28 MED ORDER — SODIUM CHLORIDE 0.9 % IV SOLN
INTRAVENOUS | Status: DC
  Administered 2014-07-28: 13:00:00 via INTRAVENOUS

## 2014-07-28 MED ORDER — MIDAZOLAM HCL 2 MG/2ML IJ SOLN
INTRAMUSCULAR | Status: AC | PRN
  Administered 2014-07-28 (×4): 1 mg via INTRAVENOUS

## 2014-07-28 MED ORDER — HEPARIN SOD (PORK) LOCK FLUSH 100 UNIT/ML IV SOLN
INTRAVENOUS | Status: AC
Start: 2014-07-28 — End: 2014-07-29
  Filled 2014-07-28: qty 5

## 2014-07-28 MED ORDER — HEPARIN SOD (PORK) LOCK FLUSH 100 UNIT/ML IV SOLN
INTRAVENOUS | Status: AC | PRN
  Administered 2014-07-28: 500 [IU]

## 2014-07-28 NOTE — H&P (Signed)
Chief Complaint: "I'm getting a port a cath"  Referring Physician(s): Gorsuch,Ni  History of Present Illness: [name] is a 59 y.o. male with history of recently diagnosed squamous cell cancer of larynx who presents today for port a cath placement for chemotherapy. Pt with significant cardiovascular history as listed below.   Past Medical History  Diagnosis Date  . Carotid stenosis   . Coronary artery disease     s/p CABG 2006; stenting to vein graft 05-2013; NSTEMI 06-2013 in setting of PEA arrest  . Subclavian artery stenosis, left     s/p stenting 2006  . Hypertension   . Hyperlipidemia   . Ischemic cardiomyopathy     echo 06/2013 EF 10-15% (normal 01/2013)  . At risk for sudden cardiac death, has lifevest at discharge 06/20/2013  . Shock liver 06/20/2013  . Acute MI, troponin > 20, no obvious culprit vessel 06/20/2013  . Hypothermia, induced, post arrest 06/20/2013  . Acute encephalopathy, improved 06/20/2013  . Dizziness 06/20/2013  . Weakness due to cardiac arrest 06/20/2013  . Fever, possible aspiration-treated with antibiotics 06/20/2013  . Tobacco abuse     >100 pack year history   . Dyslipidemia 10/09/2013    [place] Study atorvastatin -eIRB # [id number] tablet Take 40 mg by mouth daily.   Marland Kitchen PAD (peripheral artery disease) 10/09/2013    Occluded left common carotid and moderate right internal carotid artery stenosis. Previous stent to the left subclavian artery. Right iliac stent. Bilateral 60-99% renal artery stenosis    . Heart failure, acute on chronic, systolic and diastolic 16/10/9602  . Anxiety   . GERD (gastroesophageal reflux disease)   . 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 Angioplasty  05/2013    stent to SVG - LAD vein graft at [facility]  . Coronary artery bypass graft  09/2004    SVG to LAD (at [facility])  . Nm myocar perf wall motion  06/2008    persantine myoview - normal pattern of systolic thickening/wall  motion/perfusion;  . Lower extremity arterial doppler  08/2008    right and left ABI - mild arterial insufficiency at rest; R CIA/stent with 50-69% narrowing, L CIA with increased velocities (50-69% diameter reduction)  . Renal artery doppler  05/2008    right and left prox renal arteries with narrowing and increased velocities (60-99% diameter reduction)  . Iliac artery stent  07/24/2008    8x6 Smart nitinol self-expanding stent to origin of ilaci down into external iliac crossing the hypogastric artery (Dr. Adora Fridge)  . Subclavian angiogram Left 08/20/2004    8x36mm Genesis balloon mounted stent  . Left heart catheterization with coronary/graft angiogram N/A 06/15/2013    Procedure: LEFT HEART CATHETERIZATION WITH Beatrix Fetters;  Surgeon: Sinclair Grooms, MD;  Location: [facility] CATH LAB;  Service: Cardiovascular;  Laterality: N/A;  . Cardiac catheterization    . Direct laryngoscopy N/A 07/06/2014    Procedure: DIRECT LARYNGOSCOPY AND ESOPAGOSCOPY WITH BIOPSY;  Surgeon: [name], MD;  Location: [place];  Service: ENT;  Laterality: N/A;    Allergies: Review of patient's allergies indicates no known allergies.  Medications: Prior to Admission medications   Medication Sig Start Date End Date Taking? Authorizing Provider  acetaminophen (TYLENOL) 500 MG tablet Take 500 mg by mouth every 6 (six) hours as needed.    Historical Provider, MD  ALPRAZolam Duanne Moron) 0.5 MG tablet Take 0.5 mg by mouth 3 (three)  times daily as needed for anxiety.     Historical Provider, MD  aspirin EC 81 MG tablet Take 81 mg by mouth daily.    Historical Provider, MD  buPROPion (WELLBUTRIN XL) 300 MG 24 hr tablet Take 300 mg by mouth daily.    Historical Provider, MD  carvedilol (COREG) 12.5 MG tablet TAKE 1 TABLET BY MOUTH TWICE DAILY. 07/10/14   Mihai Croitoru, MD  furosemide (LASIX) 40 MG tablet Take 40 mg by mouth daily.     Historical Provider, MD  KRILL OIL PO Take 1 capsule by mouth daily.    Historical  Provider, MD  lisinopril (PRINIVIL,ZESTRIL) 10 MG tablet Take 10mg  in the AM and 20mg  in the PM Patient taking differently: Take 10 mg by mouth daily.  03/13/14   Mihai Croitoru, MD  Multiple Vitamins-Minerals (MULTIVITAMIN GUMMIES ADULT) CHEW Chew 1 each by mouth 2 (two) times daily.    Historical Provider, MD  nitroGLYCERIN (NITROSTAT) 0.4 MG SL tablet Place 0.4 mg under the tongue every 5 (five) minutes as needed for chest pain.    Historical Provider, MD  oxyCODONE 10 MG TABS Take 1 tablet (10 mg total) by mouth every 4 (four) hours as needed for severe pain. 07/20/14   [name], MD  pantoprazole (PROTONIX) 40 MG tablet TAKE 1 TABLET BY MOUTH EVERY DAY 04/17/14   Mihai Croitoru, MD  potassium chloride SA (K-DUR,KLOR-CON) 20 MEQ tablet Take 1 tablet (20 mEq total) by mouth daily. 12/26/13   Sanda Klein, MD     Family History  Problem Relation Age of Onset  . Heart disease Maternal Aunt   . Heart attack Mother   . Stroke Mother   . Cancer Father   . Stroke Sister   . Heart Problems Sister     History   Social History  . Marital Status: Married    Spouse Name: N/A  . Number of Children: 45  . Years of Education: 10   Occupational History  . carpenter    Social History Main Topics  . Smoking status: Former Smoker -- 4.00 packs/day for 45 years    Types: Cigarettes    Quit date: 06/09/2013  . Smokeless tobacco: Former Systems developer     Comment: tried for a few weeks  . Alcohol Use: 12.6 - 25.2 oz/week    21-42 Cans of beer per week     Comment: 3-6 12 oz cans daily   . Drug Use: No  . Sexual Activity: Not on file   Other Topics Concern  . None   Social History Narrative     Review of Systems  Constitutional: Negative for fever and chills.  HENT: Positive for hearing loss.        Hoarseness  Respiratory: Positive for cough. Negative for shortness of breath.   Cardiovascular: Negative for chest pain.  Gastrointestinal: Negative for nausea, vomiting, abdominal pain and  blood in stool.  Genitourinary: Negative for dysuria and hematuria.  Musculoskeletal: Negative for back pain.  Neurological: Negative for headaches.    Vital Signs: BP 111/70 mmHg  Pulse 81  Temp(Src) 98.2 F (36.8 C) (Oral)  Resp 18  Ht 5\' 7"  (1.702 m)  Wt 181 lb 9.6 oz (82.373 kg)  BMI 28.44 kg/m2  SpO2 96%  Physical Exam  Constitutional: He is oriented to person, place, and time. He appears well-developed and well-nourished.  Cardiovascular: Normal rate and regular rhythm.   Pulmonary/Chest: Effort normal and breath sounds normal.  Abdominal: Soft. Bowel sounds are  normal. There is no tenderness.  Musculoskeletal: Normal range of motion. He exhibits no edema.  Neurological: He is alert and oriented to person, place, and time.    Mallampati Score:     Imaging: Nm Pet Image Initial (pi) Skull Base To Thigh  07/27/2014   CLINICAL DATA:  Initial treatment strategy for laryngeal carcinoma.  EXAM: NUCLEAR MEDICINE PET SKULL BASE TO THIGH  TECHNIQUE: 8.6 mCi F-18 FDG was injected intravenously. Full-ring PET imaging was performed from the skull base to thigh after the radiotracer. CT data was obtained and used for attenuation correction and anatomic localization.  FASTING BLOOD GLUCOSE:  Value: 111 mg/dl  COMPARISON:  Neck CT 06/22/2014  FINDINGS: NECK  There is a long segment of hypermetabolic activity centered in the left larynx with intense metabolic activity (SUV max equal 15.1. Activity just crosses midline anteriorly.  There is a single mildly hypermetabolic left level 2 lymph node which is small measuring 7 mm short axis on image 30, series 4 with SUV max of 2.0. No clear additional hypermetabolic cervical lymph nodes are present.  CHEST  No hypermetabolic supraclavicular nodes. No hypermetabolic mediastinal nodes. Single very small nodule in the right upper lobe measures 4 mm on image 77, series 4.  ABDOMEN/PELVIS  No abnormal hypermetabolic activity within the liver, pancreas,  adrenal glands, or spleen. No hypermetabolic lymph nodes in the abdomen or pelvis.  Atherosclerotic calcification aorta noted.  SKELETON  No focal hypermetabolic activity to suggest skeletal metastasis.  IMPRESSION: 1. Intense metabolic activity associated with the left laryngeal mass. 2. A single mildly hypermetabolic small left level 2 lymph node. No additional evidence of metastatic adenopathy in neck. 3. No evidence of or distant metastatic adenopathy in the thorax. 4. Single small right upper lobe pulmonary nodule is likely benign. Recommend attention on follow-up   Electronically Signed   By: Suzy Bouchard M.D.   On: 07/27/2014 16:53    Labs:  CBC:  Recent Labs  06/22/14 1729 07/06/14 0641 07/28/14 1230  WBC 10.7* 11.2* 11.8*  HGB 11.3* 12.5* 10.7*  HCT 33.8* 37.6* 32.9*  PLT 260 378 379    COAGS:  Recent Labs  06/22/14 1729  INR 0.99    BMP:  Recent Labs  08/09/13 1214 01/11/14 0907 06/22/14 1729 07/06/14 0641  NA 140 133* 134* 138  K 4.6 4.4 3.6 3.6  CL 105 95* 96* 94*  CO2 24 29 25  32  GLUCOSE 103* 98 88 96  BUN 13 20 18 15   CALCIUM 9.7 9.4 8.9 9.7  CREATININE 1.16 1.29 1.31* 1.23  GFRNONAA  --   --  57* >60  GFRAA  --   --  >60 >60    LIVER FUNCTION TESTS:  Recent Labs  08/09/13 1214 06/22/14 1729  BILITOT 0.5 0.3  AST 34 27  ALT 34 26  ALKPHOS 131* 116  PROT 6.8 6.9  ALBUMIN 4.2 3.8    TUMOR MARKERS: No results for input(s): AFPTM, CEA, CA199, CHROMGRNA in the last 8760 hours.  Assessment and Plan: [name] is a 59 y.o. male with history of recently diagnosed squamous cell cancer of larynx who presents today for port a cath placement for chemotherapy. Risks and benefits discussed with the patient/family including, but not limited to bleeding, infection, pneumothorax, or fibrin sheath development and need for additional procedures. All of the patient's questions were answered, patient is agreeable to proceed.Consent signed and in  chart.       Signed: D. Rowe Robert  07/28/2014, 1:10 PM   I spent a total of 30 minutes in face to face in clinical consultation, greater than 50% of which was counseling/coordinating care for port a cath placement

## 2014-07-28 NOTE — Discharge Instructions (Signed)

## 2014-07-28 NOTE — Procedures (Signed)
Successful RT IJ POWER PORT TIP SVC/RA NO COMP STABLE READY FOR USE FULL REPORT IN PACS EBL 0

## 2014-07-28 NOTE — Progress Notes (Signed)
  Oncology Nurse Navigator Documentation   Navigator Encounter Type: Other (07/28/14 1430)      To provide support and encouragement, care continuity and to assess for needs, met with patient during CT SIM. He tolerated procedure without difficulty. I showed him again Tomo tmt area, explained arrival/preparation procedure.  He verbalized understanding.  [name], RN, BSN, [place] at [place] [phone]

## 2014-08-01 ENCOUNTER — Telehealth: Payer: Self-pay | Admitting: Hematology and Oncology

## 2014-08-01 ENCOUNTER — Other Ambulatory Visit (HOSPITAL_BASED_OUTPATIENT_CLINIC_OR_DEPARTMENT_OTHER): Payer: Medicaid Other

## 2014-08-01 ENCOUNTER — Encounter: Payer: Self-pay | Admitting: *Deleted

## 2014-08-01 ENCOUNTER — Ambulatory Visit (HOSPITAL_BASED_OUTPATIENT_CLINIC_OR_DEPARTMENT_OTHER): Payer: Medicaid Other | Admitting: Hematology and Oncology

## 2014-08-01 ENCOUNTER — Encounter: Payer: Self-pay | Admitting: Hematology and Oncology

## 2014-08-01 VITALS — BP 120/60 | HR 81 | Temp 98.1°F | Resp 18 | Ht 67.0 in | Wt 178.7 lb

## 2014-08-01 DIAGNOSIS — D638 Anemia in other chronic diseases classified elsewhere: Secondary | ICD-10-CM

## 2014-08-01 DIAGNOSIS — G893 Neoplasm related pain (acute) (chronic): Secondary | ICD-10-CM

## 2014-08-01 DIAGNOSIS — J029 Acute pharyngitis, unspecified: Secondary | ICD-10-CM | POA: Insufficient documentation

## 2014-08-01 DIAGNOSIS — C329 Malignant neoplasm of larynx, unspecified: Secondary | ICD-10-CM

## 2014-08-01 DIAGNOSIS — I255 Ischemic cardiomyopathy: Secondary | ICD-10-CM | POA: Diagnosis not present

## 2014-08-01 DIAGNOSIS — C321 Malignant neoplasm of supraglottis: Secondary | ICD-10-CM

## 2014-08-01 LAB — CBC WITH DIFFERENTIAL/PLATELET
BASO%: 0.2 % (ref 0.0–2.0)
BASOS ABS: 0 10*3/uL (ref 0.0–0.1)
EOS ABS: 0.2 10*3/uL (ref 0.0–0.5)
EOS%: 2.5 % (ref 0.0–7.0)
HEMATOCRIT: 32.8 % — AB (ref 38.4–49.9)
HEMOGLOBIN: 10.6 g/dL — AB (ref 13.0–17.1)
LYMPH%: 30.4 % (ref 14.0–49.0)
MCH: 30.1 pg (ref 27.2–33.4)
MCHC: 32.3 g/dL (ref 32.0–36.0)
MCV: 93.2 fL (ref 79.3–98.0)
MONO#: 1.2 10*3/uL — AB (ref 0.1–0.9)
MONO%: 13.7 % (ref 0.0–14.0)
NEUT#: 4.6 10*3/uL (ref 1.5–6.5)
NEUT%: 53.2 % (ref 39.0–75.0)
PLATELETS: 363 10*3/uL (ref 140–400)
RBC: 3.52 10*6/uL — ABNORMAL LOW (ref 4.20–5.82)
RDW: 14.5 % (ref 11.0–14.6)
WBC: 8.7 10*3/uL (ref 4.0–10.3)
lymph#: 2.6 10*3/uL (ref 0.9–3.3)

## 2014-08-01 LAB — COMPREHENSIVE METABOLIC PANEL (CC13)
ALT: 25 U/L (ref 0–55)
ANION GAP: 8 meq/L (ref 3–11)
AST: 26 U/L (ref 5–34)
Albumin: 3.5 g/dL (ref 3.5–5.0)
Alkaline Phosphatase: 118 U/L (ref 40–150)
BUN: 14.1 mg/dL (ref 7.0–26.0)
CALCIUM: 9.3 mg/dL (ref 8.4–10.4)
CO2: 28 mEq/L (ref 22–29)
Chloride: 101 mEq/L (ref 98–109)
Creatinine: 1.1 mg/dL (ref 0.7–1.3)
EGFR: 76 mL/min/{1.73_m2} — AB (ref >90)
Glucose: 101 mg/dl (ref 70–140)
Potassium: 4.3 mEq/L (ref 3.5–5.1)
SODIUM: 137 meq/L (ref 136–145)
Total Bilirubin: 0.45 mg/dL (ref 0.20–1.20)
Total Protein: 6.7 g/dL (ref 6.4–8.3)

## 2014-08-01 LAB — MAGNESIUM (CC13): Magnesium: 2.3 mg/dl (ref 1.5–2.5)

## 2014-08-01 MED ORDER — FENTANYL 25 MCG/HR TD PT72: 25.0000 ug | MEDICATED_PATCH | TRANSDERMAL | Status: DC

## 2014-08-01 MED ORDER — MORPHINE SULFATE (CONCENTRATE) 20 MG/ML PO SOLN: 10.0000 mg | ORAL | Status: DC | PRN

## 2014-08-01 MED ORDER — LIDOCAINE-PRILOCAINE 2.5-2.5 % EX CREA: TOPICAL_CREAM | CUTANEOUS | Status: DC

## 2014-08-01 NOTE — Assessment & Plan Note (Signed)
He has cancer associated pain. The oxycodone that was prescribed by the radiation oncologist is not enough to control his pain. I recommend starting him on fentanyl patch along with liquid morphine sulfate. I discussed with him the risk and benefit of switching his treatment. I will be monitoring his pain medication requirement on a weekly basis.

## 2014-08-01 NOTE — Progress Notes (Signed)
[facility] OFFICE PROGRESS NOTE  Patient Care Team: [name], NP as PCP - General [name], RN as Oncology Nurse Navigator [name], MD as Consulting Physician (Radiation Oncology)  SUMMARY OF ONCOLOGIC HISTORY: Oncology History   Squamous cell carcinoma of larynx   Staging form: Larynx - Supraglottis, AJCC 7th Edition     Clinical stage from 07/20/2014: Stage III (T3, N0, M0) - Signed by [name], MD on 08/01/2014       Squamous cell carcinoma of larynx   06/22/2014 Imaging CT scan showed greater than 2 cm supraglottic squamous cell carcinoma of the larynx with contralateral extension. No regional pathologic adenopathy   07/12/2014 Pathology Results Accession: [id number] biopsy of larynx show squamous cell carcinoma   07/12/2014 Surgery Laryngoscopy revealed a large exophytic tumor arising from the left supraglottic larynx, aryepiglottic fold. It extended from the anterior commissure and the epiglottis anteriorly, to the arytenoid posteriorly. The entire aryepiglottic fold was involved   07/27/2014 Imaging Staging PET:  1. Intense metabolic activity associated with the left laryngeal mass.  2. Single mildly hypermetabolic small left level 2 lymph node.  No additional evidence of metastatic adenopathy in neck.  3. No evidence of thoracic metastasis.   07/28/2014 Procedure Port-a-cath placed.    INTERVAL HISTORY: Please see below for problem oriented charting. He returns for further follow-up. He felt that the pain medicine is not controlling his pain long enough. He is afraid to take the oxycodone more frequently because it may run out sooner. He denies any swallowing difficulties. He had port placed recently without problems  REVIEW OF SYSTEMS:   Constitutional: Denies fevers, chills or abnormal weight loss Eyes: Denies blurriness of vision Ears, nose, mouth, throat, and face: Denies mucositis t Respiratory: Denies cough, dyspnea or  wheezes Cardiovascular: Denies palpitation, chest discomfort or lower extremity swelling Gastrointestinal:  Denies nausea, heartburn or change in bowel habits Skin: Denies abnormal skin rashes Lymphatics: Denies new lymphadenopathy or easy bruising Neurological:Denies numbness, tingling or new weaknesses Behavioral/Psych: Mood is stable, no new changes  All other systems were reviewed with the patient and are negative.  I have reviewed the past medical history, past surgical history, social history and family history with the patient and they are unchanged from previous note.  ALLERGIES:  has No Known Allergies.  MEDICATIONS:  Current Outpatient Prescriptions  Medication Sig Dispense Refill  . acetaminophen (TYLENOL) 500 MG tablet Take 500 mg by mouth every 6 (six) hours as needed.    . ALPRAZolam (XANAX) 0.5 MG tablet Take 0.5 mg by mouth 3 (three) times daily as needed for anxiety.     Marland Kitchen aspirin EC 81 MG tablet Take 81 mg by mouth daily.    Marland Kitchen buPROPion (WELLBUTRIN XL) 300 MG 24 hr tablet Take 300 mg by mouth daily.    . carvedilol (COREG) 12.5 MG tablet TAKE 1 TABLET BY MOUTH TWICE DAILY. 60 tablet 8  . fentaNYL (DURAGESIC - DOSED MCG/HR) 25 MCG/HR patch Place 1 patch (25 mcg total) onto the skin every 3 (three) days. 5 patch 0  . KRILL OIL PO Take 1 capsule by mouth daily.    Marland Kitchen lidocaine-prilocaine (EMLA) cream Apply to affected area once 30 g 3  . morphine (ROXANOL) 20 MG/ML concentrated solution Take 0.5 mLs (10 mg total) by mouth every 2 (two) hours as needed for severe pain. 240 mL 0  . Multiple Vitamins-Minerals (MULTIVITAMIN GUMMIES ADULT) CHEW Chew 1 each by mouth 2 (two) times daily.    Marland Kitchen  nitroGLYCERIN (NITROSTAT) 0.4 MG SL tablet Place 0.4 mg under the tongue every 5 (five) minutes as needed for chest pain.    Marland Kitchen oxyCODONE 10 MG TABS Take 1 tablet (10 mg total) by mouth every 4 (four) hours as needed for severe pain. 60 tablet 0  . pantoprazole (PROTONIX) 40 MG tablet TAKE 1  TABLET BY MOUTH EVERY DAY 90 tablet 3  . potassium chloride SA (K-DUR,KLOR-CON) 20 MEQ tablet Take 1 tablet (20 mEq total) by mouth daily. 90 tablet 3   No current facility-administered medications for this visit.    PHYSICAL EXAMINATION: ECOG PERFORMANCE STATUS: 1 - Symptomatic but completely ambulatory  Filed Vitals:   08/01/14 1242  BP: 120/60  Pulse: 81  Temp: 98.1 F (36.7 C)  Resp: 18   Filed Weights   08/01/14 1242  Weight: 178 lb 11.2 oz (81.058 kg)    GENERAL:alert, no distress and comfortable SKIN: skin color, texture, turgor are normal, no rashes or significant lesions EYES: normal, Conjunctiva are pink and non-injected, sclera clear Musculoskeletal:no cyanosis of digits and no clubbing . Mild bruising at the port site. NEURO: alert & oriented x 3 with fluent speech, no focal motor/sensory deficits. He has hearing deficit and hoarseness  LABORATORY DATA:  I have reviewed the data as listed    Component Value Date/Time   NA 138 07/06/2014 0641   K 3.6 07/06/2014 0641   CL 94* 07/06/2014 0641   CO2 32 07/06/2014 0641   GLUCOSE 96 07/06/2014 0641   BUN 15 07/06/2014 0641   CREATININE 1.23 07/06/2014 0641   CREATININE 1.29 01/11/2014 0907   CALCIUM 9.7 07/06/2014 0641   PROT 6.9 06/22/2014 1729   ALBUMIN 3.8 06/22/2014 1729   AST 27 06/22/2014 1729   ALT 26 06/22/2014 1729   ALKPHOS 116 06/22/2014 1729   BILITOT 0.3 06/22/2014 1729   GFRNONAA >60 07/06/2014 0641   GFRAA >60 07/06/2014 0641    No results found for: SPEP, UPEP  Lab Results  Component Value Date   WBC 8.7 08/01/2014   NEUTROABS 4.6 08/01/2014   HGB 10.6* 08/01/2014   HCT 32.8* 08/01/2014   MCV 93.2 08/01/2014   PLT 363 08/01/2014      Chemistry      Component Value Date/Time   NA 138 07/06/2014 0641   K 3.6 07/06/2014 0641   CL 94* 07/06/2014 0641   CO2 32 07/06/2014 0641   BUN 15 07/06/2014 0641   CREATININE 1.23 07/06/2014 0641   CREATININE 1.29 01/11/2014 0907       Component Value Date/Time   CALCIUM 9.7 07/06/2014 0641   ALKPHOS 116 06/22/2014 1729   AST 27 06/22/2014 1729   ALT 26 06/22/2014 1729   BILITOT 0.3 06/22/2014 1729       RADIOGRAPHIC STUDIES: I reviewed the PET CT scan. I have personally reviewed the radiological images as listed and agreed with the findings in the report.  ASSESSMENT & PLAN:  Squamous cell carcinoma of larynx The decision was made based on recent publication on NEJM. Role of treatment is curative and it is category 1 recommendation according to NCCN guidelines.  Radiotherapy plus Cetuximab for Squamous-Cell Carcinoma of the Head and Neck James A. Shara Blazing, M.D., Carey Bullocks. Graylon Good, M.D., Trilby Leaver, M.D., Gweneth Dimitri, Ph.D., Jonelle Sports. Vernard Gambles, M.D., Blair Hailey. Patrice Paradise, M.D., Terese Door, M.D., Golden Circle, M.D., Ph.D., Kirk Ruths, M.D., Jonah Blue, M.D., Ph.D., Lennie Odor, M.D., Ph.D., Nelly Rout, M.D., Cameron Ali, M.D., Coralee North, M.D.,  Rana Snare, M.D., Kerry Hough. Rowinsky, M.D., and Clemon Chambers Ang, M.D., Ph.D.* [employer] [facility] 2006; 354:567-578February 9, 2006DOI: 10.1056/[email or web address]  The epidermal growth factor receptor (EGFR), a member of the ErbB family of receptor tyrosine kinases, is abnormally activated in epithelial cancers, including head and neck cancer. The cells of almost all such neoplasms express high levels of EGFR, a feature associated with a poor clinical outcome. Radiation increases the expression of EGFR in cancer cells, and blockade of EGFR signaling sensitizes cells to the effects of radiation.  The administration of intravenous cetuximab was initiated one week before radiotherapy at a loading dose of 400 mg per square meter of body-surface area over a period of 120 minutes, followed by weekly 60-minute infusions of 250 mg per square meter for the duration of radiotherapy.  The median duration of locoregional control was 24.4 months among patients treated with cetuximab plus radiotherapy  and 14.9 months among those given radiotherapy alone (hazard ratio for locoregional progression or death, 0.68; P=0.005). With a median follow-up of 54.0 months, the median duration of overall survival was 49.0 months among patients treated with combined therapy and 29.3 months among those treated with radiotherapy alone (hazard ratio for death, 0.74; P=0.03). Radiotherapy plus cetuximab significantly prolonged progression-free survival (hazard ratio for disease progression or death, 0.70; P=0.006). With the exception of acneiform rash and infusion reactions, the incidence of grade 3 or greater toxic effects, including mucositis, did not differ significantly between the two groups.  We discussed some of the risks, benefits, side-effects of Erbitux/cetuximab.  Some of the short term side-effects included, though not limited to, risk of fatigue, pancytopenia, life-threatening infections, allergic reactions, nausea, vomiting, sores in the mouth, changes in bowel habits especially diarrhea, admission to hospital for various reasons, and risks of death.   The patient is aware that the response rates discussed earlier is not guaranteed.    After a long discussion, patient made an informed decision to proceed.   Patient education material was dispensed.    Anemia in chronic illness This is likely anemia of chronic disease. The patient denies recent history of bleeding such as epistaxis, hematuria or hematochezia. He is asymptomatic from the anemia. We will observe for now.  He does not require transfusion now.    Ischemic cardiomyopathy, EF 45% The patient have history of ischemic cardiomyopathy without any clinical signs of congestive heart failure now. Due to increased risk of dehydration, I recommend he stop Lasix and lisinopril for now.  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 The oxycodone that was prescribed by the radiation oncologist is not enough to control his pain. I  recommend starting him on fentanyl patch along with liquid morphine sulfate. I discussed with him the risk and benefit of switching his treatment. I will be monitoring his pain medication requirement on a weekly basis.   Orders Placed This Encounter  Procedures  . Magnesium    Standing Status: Standing     Number of Occurrences: 20     Standing Expiration Date: 08/02/2015   All questions were answered. The patient knows to call the clinic with any problems, questions or concerns. No barriers to learning was detected. I spent 30 minutes counseling the patient face to face. The total time spent in the appointment was 40 minutes and more than 50% was on counseling and review of test results     [facility], [place], MD 08/01/2014 1:29 PM

## 2014-08-01 NOTE — Progress Notes (Signed)
  Oncology Nurse Navigator Documentation   Navigator Encounter Type: Clinic/MDC (08/01/14 1250)      To provide support and encouragement, care continuity and to assess for needs, met with patient during est pt appt with Dr. Alvy Bimler.  He begins 1st cycle Cetuximab tomorrow. I reinforced Dr. Calton Dach explanation of using newly prescribed:  fentanyl 25 mcg and Roxanol.  I encouraged him to keep medication diary so Dr. Alvy Bimler can adjust as necessary.  He verbalized understanding.  Emla cream.  He understands to apply 1-2 hrs before infusion appt. I encouraged him to try [facility] for today's new Rx, citing convenience, assurance of having med, and cost savings.  He indicated he would give it a try.   [name], RN, BSN, [facility] [phone]

## 2014-08-01 NOTE — Telephone Encounter (Signed)
Gave and printed appt sched and avs fo rpt for July and AUg °

## 2014-08-01 NOTE — Assessment & Plan Note (Addendum)
The decision was made based on recent publication on NEJM. Role of treatment is curative and it is category 1 recommendation according to NCCN guidelines.  Radiotherapy plus Cetuximab for Squamous-Cell Carcinoma of the Head and Neck James A. Shara Blazing, M.D., Carey Bullocks. Graylon Good, M.D., Trilby Leaver, M.D., Gweneth Dimitri, Ph.D., Jonelle Sports. Vernard Gambles, M.D., Blair Hailey. Patrice Paradise, M.D., Terese Door, M.D., Golden Circle, M.D., Ph.D., Kirk Ruths, M.D., Jonah Blue, M.D., Ph.D., Lennie Odor, M.D., Ph.D., Nelly Rout, M.D., Cameron Ali, M.D., Coralee North, M.D., Rana Snare, M.D., Kerry Hough. Rowinsky, M.D., and Clemon Chambers Ang, M.D., Ph.D.* [employer] [facility] 2006; 354:567-578February 9, 2006DOI: 10.1056/[email or web address]  The epidermal growth factor receptor (EGFR), a member of the ErbB family of receptor tyrosine kinases, is abnormally activated in epithelial cancers, including head and neck cancer. The cells of almost all such neoplasms express high levels of EGFR, a feature associated with a poor clinical outcome. Radiation increases the expression of EGFR in cancer cells, and blockade of EGFR signaling sensitizes cells to the effects of radiation.  The administration of intravenous cetuximab was initiated one week before radiotherapy at a loading dose of 400 mg per square meter of body-surface area over a period of 120 minutes, followed by weekly 60-minute infusions of 250 mg per square meter for the duration of radiotherapy.  The median duration of locoregional control was 24.4 months among patients treated with cetuximab plus radiotherapy and 14.9 months among those given radiotherapy alone (hazard ratio for locoregional progression or death, 0.68; P=0.005). With a median follow-up of 54.0 months, the median duration of overall survival was 49.0 months among patients treated with combined therapy and 29.3 months among those treated with radiotherapy alone (hazard ratio for death, 0.74; P=0.03). Radiotherapy plus cetuximab  significantly prolonged progression-free survival (hazard ratio for disease progression or death, 0.70; P=0.006). With the exception of acneiform rash and infusion reactions, the incidence of grade 3 or greater toxic effects, including mucositis, did not differ significantly between the two groups.  We discussed some of the risks, benefits, side-effects of Erbitux/cetuximab.  Some of the short term side-effects included, though not limited to, risk of fatigue, pancytopenia, life-threatening infections, allergic reactions, nausea, vomiting, sores in the mouth, changes in bowel habits especially diarrhea, admission to hospital for various reasons, and risks of death.   The patient is aware that the response rates discussed earlier is not guaranteed.    After a long discussion, patient made an informed decision to proceed.   Patient education material was dispensed.

## 2014-08-01 NOTE — Assessment & Plan Note (Signed)
The patient have history of ischemic cardiomyopathy without any clinical signs of congestive heart failure now. Due to increased risk of dehydration, I recommend he stop Lasix and lisinopril for now.

## 2014-08-01 NOTE — Assessment & Plan Note (Signed)
This is likely anemia of chronic disease. The patient denies recent history of bleeding such as epistaxis, hematuria or hematochezia. He is asymptomatic from the anemia. We will observe for now.  He does not require transfusion now.   

## 2014-08-01 NOTE — Telephone Encounter (Signed)
Gave and printed appt sched and avs for pt for July and Aug °

## 2014-08-02 ENCOUNTER — Emergency Department (HOSPITAL_COMMUNITY): Payer: Medicaid Other | Admitting: Anesthesiology

## 2014-08-02 ENCOUNTER — Emergency Department: Payer: Medicaid Other | Admitting: Anesthesiology

## 2014-08-02 ENCOUNTER — Encounter (HOSPITAL_COMMUNITY): Payer: Self-pay

## 2014-08-02 ENCOUNTER — Ambulatory Visit (HOSPITAL_BASED_OUTPATIENT_CLINIC_OR_DEPARTMENT_OTHER): Payer: Medicaid Other

## 2014-08-02 ENCOUNTER — Encounter: Payer: Self-pay | Admitting: General Practice

## 2014-08-02 ENCOUNTER — Encounter: Payer: Self-pay | Admitting: *Deleted

## 2014-08-02 ENCOUNTER — Emergency Department (HOSPITAL_COMMUNITY): Payer: Medicaid Other

## 2014-08-02 ENCOUNTER — Inpatient Hospital Stay (HOSPITAL_COMMUNITY)
Admission: EM | Admit: 2014-08-02 | Discharge: 2014-08-04 | DRG: 915 | Disposition: A | Discharge status: Home / Self Care | Payer: Medicaid Other | Attending: Pulmonary Disease | Admitting: Pulmonary Disease

## 2014-08-02 ENCOUNTER — Other Ambulatory Visit: Payer: Self-pay

## 2014-08-02 ENCOUNTER — Other Ambulatory Visit: Payer: Self-pay | Admitting: Hematology and Oncology

## 2014-08-02 VITALS — BP 124/67 | HR 85 | Temp 98.1°F

## 2014-08-02 DIAGNOSIS — I469 Cardiac arrest, cause unspecified: Secondary | ICD-10-CM | POA: Diagnosis not present

## 2014-08-02 DIAGNOSIS — I959 Hypotension, unspecified: Secondary | ICD-10-CM | POA: Diagnosis present

## 2014-08-02 DIAGNOSIS — T886XXA Anaphylactic reaction due to adverse effect of correct drug or medicament properly administered, initial encounter: Principal | ICD-10-CM | POA: Diagnosis present

## 2014-08-02 DIAGNOSIS — Z8674 Personal history of sudden cardiac arrest: Secondary | ICD-10-CM | POA: Diagnosis present

## 2014-08-02 DIAGNOSIS — J9600 Acute respiratory failure, unspecified whether with hypoxia or hypercapnia: Secondary | ICD-10-CM | POA: Diagnosis present

## 2014-08-02 DIAGNOSIS — G9340 Encephalopathy, unspecified: Secondary | ICD-10-CM | POA: Diagnosis present

## 2014-08-02 DIAGNOSIS — I361 Nonrheumatic tricuspid (valve) insufficiency: Secondary | ICD-10-CM | POA: Diagnosis not present

## 2014-08-02 DIAGNOSIS — Z79899 Other long term (current) drug therapy: Secondary | ICD-10-CM | POA: Diagnosis not present

## 2014-08-02 DIAGNOSIS — J029 Acute pharyngitis, unspecified: Secondary | ICD-10-CM | POA: Diagnosis present

## 2014-08-02 DIAGNOSIS — I10 Essential (primary) hypertension: Secondary | ICD-10-CM | POA: Diagnosis present

## 2014-08-02 DIAGNOSIS — I771 Stricture of artery: Secondary | ICD-10-CM | POA: Diagnosis present

## 2014-08-02 DIAGNOSIS — I214 Non-ST elevation (NSTEMI) myocardial infarction: Secondary | ICD-10-CM | POA: Diagnosis not present

## 2014-08-02 DIAGNOSIS — I259 Chronic ischemic heart disease, unspecified: Secondary | ICD-10-CM | POA: Diagnosis not present

## 2014-08-02 DIAGNOSIS — Z823 Family history of stroke: Secondary | ICD-10-CM

## 2014-08-02 DIAGNOSIS — R7989 Other specified abnormal findings of blood chemistry: Secondary | ICD-10-CM | POA: Diagnosis not present

## 2014-08-02 DIAGNOSIS — K631 Perforation of intestine (nontraumatic): Secondary | ICD-10-CM | POA: Diagnosis not present

## 2014-08-02 DIAGNOSIS — J438 Other emphysema: Secondary | ICD-10-CM | POA: Diagnosis not present

## 2014-08-02 DIAGNOSIS — I252 Old myocardial infarction: Secondary | ICD-10-CM | POA: Diagnosis not present

## 2014-08-02 DIAGNOSIS — F419 Anxiety disorder, unspecified: Secondary | ICD-10-CM | POA: Diagnosis present

## 2014-08-02 DIAGNOSIS — C329 Malignant neoplasm of larynx, unspecified: Secondary | ICD-10-CM | POA: Diagnosis present

## 2014-08-02 DIAGNOSIS — T782XXA Anaphylactic shock, unspecified, initial encounter: Secondary | ICD-10-CM | POA: Diagnosis not present

## 2014-08-02 DIAGNOSIS — K219 Gastro-esophageal reflux disease without esophagitis: Secondary | ICD-10-CM | POA: Diagnosis present

## 2014-08-02 DIAGNOSIS — Z5112 Encounter for antineoplastic immunotherapy: Secondary | ICD-10-CM | POA: Diagnosis present

## 2014-08-02 DIAGNOSIS — I468 Cardiac arrest due to other underlying condition: Secondary | ICD-10-CM | POA: Diagnosis present

## 2014-08-02 DIAGNOSIS — T451X5A Adverse effect of antineoplastic and immunosuppressive drugs, initial encounter: Secondary | ICD-10-CM | POA: Diagnosis present

## 2014-08-02 DIAGNOSIS — T887XXA Unspecified adverse effect of drug or medicament, initial encounter: Secondary | ICD-10-CM

## 2014-08-02 DIAGNOSIS — Z7982 Long term (current) use of aspirin: Secondary | ICD-10-CM

## 2014-08-02 DIAGNOSIS — G893 Neoplasm related pain (acute) (chronic): Secondary | ICD-10-CM | POA: Diagnosis present

## 2014-08-02 DIAGNOSIS — Z809 Family history of malignant neoplasm, unspecified: Secondary | ICD-10-CM

## 2014-08-02 DIAGNOSIS — I255 Ischemic cardiomyopathy: Secondary | ICD-10-CM | POA: Diagnosis present

## 2014-08-02 DIAGNOSIS — Z87891 Personal history of nicotine dependence: Secondary | ICD-10-CM

## 2014-08-02 DIAGNOSIS — Y848 Other medical procedures as the cause of abnormal reaction of the patient, or of later complication, without mention of misadventure at the time of the procedure: Secondary | ICD-10-CM | POA: Diagnosis present

## 2014-08-02 DIAGNOSIS — Z951 Presence of aortocoronary bypass graft: Secondary | ICD-10-CM | POA: Insufficient documentation

## 2014-08-02 DIAGNOSIS — D72829 Elevated white blood cell count, unspecified: Secondary | ICD-10-CM | POA: Diagnosis present

## 2014-08-02 DIAGNOSIS — C321 Malignant neoplasm of supraglottis: Secondary | ICD-10-CM

## 2014-08-02 DIAGNOSIS — Y92239 Unspecified place in hospital as the place of occurrence of the external cause: Secondary | ICD-10-CM | POA: Diagnosis not present

## 2014-08-02 DIAGNOSIS — I739 Peripheral vascular disease, unspecified: Secondary | ICD-10-CM | POA: Diagnosis present

## 2014-08-02 DIAGNOSIS — J9601 Acute respiratory failure with hypoxia: Secondary | ICD-10-CM

## 2014-08-02 DIAGNOSIS — K5720 Diverticulitis of large intestine with perforation and abscess without bleeding: Secondary | ICD-10-CM | POA: Diagnosis not present

## 2014-08-02 DIAGNOSIS — E876 Hypokalemia: Secondary | ICD-10-CM | POA: Diagnosis not present

## 2014-08-02 DIAGNOSIS — I5042 Chronic combined systolic (congestive) and diastolic (congestive) heart failure: Secondary | ICD-10-CM | POA: Diagnosis present

## 2014-08-02 DIAGNOSIS — T887XXD Unspecified adverse effect of drug or medicament, subsequent encounter: Secondary | ICD-10-CM | POA: Diagnosis not present

## 2014-08-02 DIAGNOSIS — Z79891 Long term (current) use of opiate analgesic: Secondary | ICD-10-CM | POA: Diagnosis not present

## 2014-08-02 DIAGNOSIS — D630 Anemia in neoplastic disease: Secondary | ICD-10-CM | POA: Diagnosis present

## 2014-08-02 DIAGNOSIS — Z8249 Family history of ischemic heart disease and other diseases of the circulatory system: Secondary | ICD-10-CM | POA: Diagnosis not present

## 2014-08-02 DIAGNOSIS — K659 Peritonitis, unspecified: Secondary | ICD-10-CM | POA: Diagnosis not present

## 2014-08-02 DIAGNOSIS — T50905A Adverse effect of unspecified drugs, medicaments and biological substances, initial encounter: Secondary | ICD-10-CM | POA: Insufficient documentation

## 2014-08-02 DIAGNOSIS — I2510 Atherosclerotic heart disease of native coronary artery without angina pectoris: Secondary | ICD-10-CM | POA: Diagnosis present

## 2014-08-02 DIAGNOSIS — E785 Hyperlipidemia, unspecified: Secondary | ICD-10-CM | POA: Diagnosis present

## 2014-08-02 LAB — CBC WITH DIFFERENTIAL/PLATELET
BASOS PCT: 0 % (ref 0–1)
Basophils Absolute: 0 10*3/uL (ref 0.0–0.1)
Eosinophils Absolute: 0 10*3/uL (ref 0.0–0.7)
Eosinophils Relative: 0 % (ref 0–5)
HCT: 37.3 % — ABNORMAL LOW (ref 39.0–52.0)
Hemoglobin: 11.8 g/dL — ABNORMAL LOW (ref 13.0–17.0)
Lymphocytes Relative: 37 % (ref 12–46)
Lymphs Abs: 4.5 10*3/uL — ABNORMAL HIGH (ref 0.7–4.0)
MCH: 30.2 pg (ref 26.0–34.0)
MCHC: 31.6 g/dL (ref 30.0–36.0)
MCV: 95.4 fL (ref 78.0–100.0)
MONO ABS: 0.5 10*3/uL (ref 0.1–1.0)
Monocytes Relative: 4 % (ref 3–12)
Neutro Abs: 7.2 10*3/uL (ref 1.7–7.7)
Neutrophils Relative %: 59 % (ref 43–77)
Platelets: 324 10*3/uL (ref 150–400)
RBC: 3.91 MIL/uL — AB (ref 4.22–5.81)
RDW: 14.5 % (ref 11.5–15.5)
WBC: 12.2 10*3/uL — ABNORMAL HIGH (ref 4.0–10.5)

## 2014-08-02 LAB — BASIC METABOLIC PANEL
ANION GAP: 9 (ref 5–15)
BUN: 17 mg/dL (ref 6–20)
CALCIUM: 8 mg/dL — AB (ref 8.9–10.3)
CO2: 23 mmol/L (ref 22–32)
Chloride: 105 mmol/L (ref 101–111)
Creatinine, Ser: 1.26 mg/dL — ABNORMAL HIGH (ref 0.61–1.24)
GFR calc Af Amer: >60 mL/min (ref >60)
GFR, EST NON AFRICAN AMERICAN: 60 mL/min — AB (ref >60)
GLUCOSE: 222 mg/dL — AB (ref 65–99)
POTASSIUM: 4.3 mmol/L (ref 3.5–5.1)
Sodium: 137 mmol/L (ref 135–145)

## 2014-08-02 LAB — BLOOD GAS, ARTERIAL
Acid-base deficit: 5.3 mmol/L — ABNORMAL HIGH (ref 0.0–2.0)
Bicarbonate: 20.3 mEq/L (ref 20.0–24.0)
Drawn by: 44126
FIO2: 1 %
LHR: 18 {breaths}/min
O2 Saturation: 98.9 %
PEEP: 5 cmH2O
Patient temperature: 98.6
TCO2: 18.8 mmol/L (ref 0–100)
VT: 530 mL
pCO2 arterial: 42.1 mmHg (ref 35.0–45.0)
pH, Arterial: 7.304 — ABNORMAL LOW (ref 7.350–7.450)
pO2, Arterial: 465 mmHg — ABNORMAL HIGH (ref 80.0–100.0)

## 2014-08-02 LAB — CG4 I-STAT (LACTIC ACID): Lactic Acid, Venous: 4.64 mmol/L (ref 0.5–2.0)

## 2014-08-02 LAB — POCT I-STAT TROPONIN I: Troponin i, poc: 0.01 ng/mL (ref 0.00–0.08)

## 2014-08-02 LAB — MRSA PCR SCREENING: MRSA by PCR: NEGATIVE

## 2014-08-02 MED ORDER — CETUXIMAB CHEMO IV INJECTION 200 MG/100ML
400.0000 mg/m2 | Freq: Once | INTRAVENOUS | Status: DC
  Administered 2014-08-02: 800 mg via INTRAVENOUS
  Filled 2014-08-02: qty 400

## 2014-08-02 MED ORDER — DIPHENHYDRAMINE HCL 50 MG/ML IJ SOLN: 25.0000 mg | Freq: Once | INTRAMUSCULAR | Status: DC | PRN

## 2014-08-02 MED ORDER — ONDANSETRON HCL 4 MG/2ML IJ SOLN: 4.0000 mg | Freq: Four times a day (QID) | INTRAMUSCULAR | Status: DC | PRN

## 2014-08-02 MED ORDER — FENTANYL CITRATE (PF) 100 MCG/2ML IJ SOLN
100.0000 ug | INTRAMUSCULAR | Status: DC | PRN
  Administered 2014-08-02 – 2014-08-03 (×5): 100 ug via INTRAVENOUS
  Filled 2014-08-02 (×2): qty 2

## 2014-08-02 MED ORDER — DIPHENHYDRAMINE HCL 50 MG/ML IJ SOLN
50.0000 mg | Freq: Once | INTRAMUSCULAR | Status: AC
  Administered 2014-08-02: 50 mg via INTRAVENOUS

## 2014-08-02 MED ORDER — LORAZEPAM 2 MG/ML IJ SOLN: 2.0000 mg | INTRAMUSCULAR | Status: DC | PRN

## 2014-08-02 MED ORDER — ENOXAPARIN SODIUM 40 MG/0.4ML ~~LOC~~ SOLN
40.0000 mg | SUBCUTANEOUS | Status: DC
  Administered 2014-08-02 – 2014-08-03 (×2): 40 mg via SUBCUTANEOUS
  Filled 2014-08-02 (×2): qty 0.4

## 2014-08-02 MED ORDER — METHYLPREDNISOLONE SODIUM SUCC 125 MG IJ SOLR
125.0000 mg | Freq: Once | INTRAMUSCULAR | Status: AC | PRN
  Administered 2014-08-02: 125 mg via INTRAVENOUS

## 2014-08-02 MED ORDER — BISACODYL 10 MG RE SUPP: 10.0000 mg | Freq: Every day | RECTAL | Status: DC | PRN

## 2014-08-02 MED ORDER — METHYLPREDNISOLONE SODIUM SUCC 125 MG IJ SOLR
60.0000 mg | Freq: Two times a day (BID) | INTRAMUSCULAR | Status: AC
  Administered 2014-08-02 – 2014-08-03 (×3): 60 mg via INTRAVENOUS
  Filled 2014-08-02 (×3): qty 2

## 2014-08-02 MED ORDER — HEPARIN SOD (PORK) LOCK FLUSH 100 UNIT/ML IV SOLN
500.0000 [IU] | Freq: Once | INTRAVENOUS | Status: DC | PRN
  Filled 2014-08-02: qty 5

## 2014-08-02 MED ORDER — EPINEPHRINE 0.3 MG/0.3ML IJ SOAJ
0.3000 mg | Freq: Once | INTRAMUSCULAR | Status: AC
  Administered 2014-08-02: 0.3 mg via SUBCUTANEOUS
  Filled 2014-08-02: qty 0.6

## 2014-08-02 MED ORDER — SODIUM CHLORIDE 0.9 % IJ SOLN
10.0000 mL | INTRAMUSCULAR | Status: DC | PRN
  Filled 2014-08-02: qty 10

## 2014-08-02 MED ORDER — SODIUM CHLORIDE 0.9 % IV BOLUS (SEPSIS)
1000.0000 mL | Freq: Once | INTRAVENOUS | Status: AC
  Administered 2014-08-02: 1000 mL via INTRAVENOUS

## 2014-08-02 MED ORDER — FAMOTIDINE IN NACL 20-0.9 MG/50ML-% IV SOLN: 20.0000 mg | Freq: Once | INTRAVENOUS | Status: DC | PRN

## 2014-08-02 MED ORDER — MIDAZOLAM HCL 2 MG/2ML IJ SOLN
2.0000 mg | Freq: Once | INTRAMUSCULAR | Status: AC
  Administered 2014-08-02: 2 mg via INTRAMUSCULAR
  Filled 2014-08-02: qty 2

## 2014-08-02 MED ORDER — CHLORHEXIDINE GLUCONATE 0.12 % MT SOLN
15.0000 mL | Freq: Two times a day (BID) | OROMUCOSAL | Status: DC
  Administered 2014-08-02 – 2014-08-03 (×3): 15 mL via OROMUCOSAL
  Filled 2014-08-02: qty 15

## 2014-08-02 MED ORDER — FENTANYL CITRATE (PF) 100 MCG/2ML IJ SOLN
100.0000 ug | INTRAMUSCULAR | Status: DC | PRN
  Administered 2014-08-02: 100 ug via INTRAVENOUS
  Filled 2014-08-02 (×4): qty 2

## 2014-08-02 MED ORDER — FENTANYL CITRATE (PF) 100 MCG/2ML IJ SOLN
50.0000 ug | Freq: Once | INTRAMUSCULAR | Status: AC
  Administered 2014-08-02: 50 ug via INTRAVENOUS
  Filled 2014-08-02: qty 2

## 2014-08-02 MED ORDER — NOREPINEPHRINE BITARTRATE 1 MG/ML IV SOLN
5.0000 ug/min | INTRAVENOUS | Status: DC
  Administered 2014-08-02 (×2): 5 ug/min via INTRAVENOUS
  Filled 2014-08-02 (×2): qty 4

## 2014-08-02 MED ORDER — SODIUM CHLORIDE 0.9 % IV SOLN
INTRAVENOUS | Status: DC
  Administered 2014-08-02: 15:00:00 via INTRAVENOUS

## 2014-08-02 MED ORDER — ALBUTEROL SULFATE (2.5 MG/3ML) 0.083% IN NEBU: 2.5000 mg | INHALATION_SOLUTION | RESPIRATORY_TRACT | Status: DC | PRN

## 2014-08-02 MED ORDER — FAMOTIDINE IN NACL 20-0.9 MG/50ML-% IV SOLN
20.0000 mg | Freq: Once | INTRAVENOUS | Status: AC
  Administered 2014-08-02: 20 mg via INTRAVENOUS

## 2014-08-02 MED ORDER — SODIUM CHLORIDE 0.9 % IV SOLN: Freq: Once | INTRAVENOUS | Status: DC

## 2014-08-02 MED ORDER — ACETAMINOPHEN 325 MG PO TABS: 650.0000 mg | ORAL_TABLET | ORAL | Status: DC | PRN

## 2014-08-02 MED ORDER — CETYLPYRIDINIUM CHLORIDE 0.05 % MT LIQD
7.0000 mL | Freq: Four times a day (QID) | OROMUCOSAL | Status: DC
  Administered 2014-08-02 – 2014-08-03 (×4): 7 mL via OROMUCOSAL

## 2014-08-02 MED ORDER — FAMOTIDINE IN NACL 20-0.9 MG/50ML-% IV SOLN
20.0000 mg | Freq: Two times a day (BID) | INTRAVENOUS | Status: DC
  Administered 2014-08-02 (×2): 20 mg via INTRAVENOUS
  Filled 2014-08-02 (×2): qty 50

## 2014-08-02 MED ORDER — DIPHENHYDRAMINE HCL 50 MG/ML IJ SOLN
25.0000 mg | Freq: Once | INTRAMUSCULAR | Status: AC
  Administered 2014-08-02: 25 mg via INTRAVENOUS
  Filled 2014-08-02: qty 1

## 2014-08-02 MED ORDER — SODIUM CHLORIDE 0.9 % IV SOLN: 250.0000 mL | INTRAVENOUS | Status: DC | PRN

## 2014-08-02 NOTE — ED Notes (Signed)
Patient arrived from the Infusion area at the [facility] post code blue. Patient was intubated.

## 2014-08-02 NOTE — Plan of Care (Signed)
Problem: Phase I Progression Outcomes Goal: Sedation Protocol initiated if indicated Outcome: Progressing RECEIVING IVP SEDATION

## 2014-08-02 NOTE — H&P (Signed)
PULMONARY / CRITICAL CARE MEDICINE   Name: [name] MRN: [id number] DOB: Sep 14, 1955    ADMISSION DATE:  08/02/2014 CONSULTATION DATE:  08/02/14  REFERRING MD :  Dr. 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  PCCM called for ICU admit.    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 Alvy Bimler) who presented to the the [facility] 7/20 for infusion therapy of cetuximab (first infusion 7/20).  He was pre-medicated with benadryl but unfortunately suffered a cardiac arrest after initiation of therapy.    The patient required brief CPR with 2 rounds of epinephrine with ROSC.  He was intubated by anesthesia with notes of an abnormal airway but able to pass ETT.  The patient was transported to the ER for further evaluation.  ETT verified with CXR.  He was noted to have mild hypotension post arrest with SBP's in the 80's.  Lab work currently pending at time of H&P.  PCCM called for ICU admission.    PAST MEDICAL HISTORY :   has a past medical history of Carotid stenosis; Coronary artery disease; Subclavian artery stenosis, left; Hypertension; Hyperlipidemia; Ischemic cardiomyopathy; At risk for sudden cardiac death, has  lifevest at discharge (06/20/2013); Shock liver (06/20/2013); Acute MI, troponin > 20, no obvious culprit vessel (06/20/2013); Hypothermia, induced, post arrest (06/20/2013); Acute encephalopathy, improved (06/20/2013); Dizziness (06/20/2013); Weakness due to cardiac arrest (06/20/2013); Fever, possible aspiration-treated with antibiotics (06/20/2013); Tobacco abuse; Dyslipidemia (10/09/2013); PAD (peripheral artery disease) (10/09/2013); Heart failure, acute on chronic, systolic and diastolic (25/05/3974); Anxiety; GERD (gastroesophageal reflux disease); and Squamous cell carcinoma of larynx (07/14/2014).  has past surgical history that includes Angioplasty (05/2013); Coronary artery bypass graft (09/2004); nm myocar perf wall motion (06/2008); LOWER EXTREMITY ARTERIAL DOPPLER (08/2008); RENAL ARTERY DOPPLER (05/2008); Iliac artery stent (07/24/2008); Subclavian angiogram (Left, 08/20/2004); left heart catheterization with coronary/graft angiogram (N/A, 06/15/2013); Cardiac catheterization; and Direct laryngoscopy (N/A, 07/06/2014).    Prior to Admission medications   Medication Sig Start Date End Date Taking? Authorizing Provider  acetaminophen (TYLENOL) 500 MG tablet Take 500 mg by mouth every 6 (six) hours as needed.    Historical Provider, MD  ALPRAZolam Duanne Moron) 0.5 MG tablet Take 0.5 mg by mouth 3 (three) times daily as needed for anxiety.     Historical Provider, MD  aspirin EC 81 MG tablet Take 81 mg by mouth daily.    Historical Provider, MD  buPROPion (WELLBUTRIN XL) 300 MG 24 hr tablet Take 300 mg by mouth daily.    Historical Provider, MD  carvedilol (COREG) 12.5 MG tablet TAKE 1 TABLET BY MOUTH TWICE DAILY. 07/10/14   Mihai Croitoru, MD  fentaNYL (DURAGESIC - DOSED MCG/HR) 25 MCG/HR patch Place 1 patch (25 mcg total) onto the skin every  3 (three) days. 08/01/14   Ni Gorsuch, MD  KRILL OIL PO Take 1 capsule by mouth daily.    Historical Provider, MD  lidocaine-prilocaine (EMLA) cream Apply to affected area once 08/01/14   [name], MD  morphine (ROXANOL) 20 MG/ML concentrated solution Take 0.5 mLs (10 mg total) by mouth every 2 (two) hours as needed for severe pain. 08/01/14   [name], MD  Multiple Vitamins-Minerals (MULTIVITAMIN GUMMIES ADULT) CHEW Chew 1 each by mouth 2 (two) times daily.    Historical Provider, MD  nitroGLYCERIN (NITROSTAT) 0.4 MG SL tablet Place 0.4 mg under the tongue every 5 (five) minutes as needed for chest pain.    Historical Provider, MD  oxyCODONE 10 MG TABS Take 1 tablet (10 mg total) by mouth every 4 (four) hours as needed for severe pain. 07/20/14   [name], MD  pantoprazole (PROTONIX) 40 MG tablet TAKE 1 TABLET BY MOUTH EVERY DAY 04/17/14   Mihai Croitoru, MD  potassium chloride SA (K-DUR,KLOR-CON) 20 MEQ tablet Take 1 tablet (20 mEq total) by mouth daily. 12/26/13   Mihai Croitoru, MD   No Known Allergies  FAMILY HISTORY:  has no family status information on file.    SOCIAL HISTORY:  reports that he quit smoking about 13 months ago. His smoking use included Cigarettes. He has a 180 pack-year smoking history. He has quit using smokeless tobacco. He reports that he drinks about 12.6 - 25.2 oz of alcohol per week. He reports that he does not use illicit drugs.  REVIEW OF SYSTEMS:  Unable to complete as patient is altered on mechanical ventilation post arrest.    SUBJECTIVE:   VITAL SIGNS: Temp:  [98.1 F (36.7 C)] 98.1 F (36.7 C) (07/20 0800) Pulse Rate:  [78-89] 87 (07/20 0955) Resp:  [18-24] 23 (07/20 0955) BP: (68-124)/(53-67) 68/53 mmHg (07/20 0955) SpO2:  [96 %-100 %] 96 % (07/20 0955) Weight:  [178 lb 11.2 oz (81.058 kg)] 178 lb 11.2 oz (81.058 kg) (07/19 1242)   HEMODYNAMICS:     VENTILATOR SETTINGS:     INTAKE / OUTPUT: No intake or output data in the 24 hours ending 08/02/14 1002  PHYSICAL EXAMINATION: General:  wdwn adult male in NAD on MV Neuro:  Arouses, follows commands, MAE, pupils =R  HEENT:  MM pink/moist, OETT, no JVD Cardiovascular:  s1s2  rrr, no m/r/g Lungs:  resp's even/non-labored on MV, lungs bilaterally clear  Abdomen:  Obese/soft, bsx4 active Musculoskeletal:  No acute deformities  Skin:  Warm/dry, no peripheral edema, clubbing or cyanosis   LABS:  CBC  Recent Labs Lab 07/28/14 1230 08/01/14 1223  WBC 11.8* 8.7  HGB 10.7* 10.6*  HCT 32.9* 32.8*  PLT 379 363   Coag's  Recent Labs Lab 07/28/14 1230  APTT 32  INR 0.94   BMET  Recent Labs Lab 08/01/14 1223  NA 137  K 4.3  CO2 28  BUN 14.1  CREATININE 1.1  GLUCOSE 101   Electrolytes  Recent Labs Lab 08/01/14 1223  CALCIUM 9.3  MG 2.3   Sepsis Markers No results for input(s): LATICACIDVEN, PROCALCITON, O2SATVEN in the last 168 hours.   ABG No results for input(s): PHART, PCO2ART, PO2ART in the last 168 hours.   Liver Enzymes  Recent Labs Lab 08/01/14 1223  AST 26  ALT 25  ALKPHOS 118  BILITOT 0.45  ALBUMIN 3.5   Cardiac Enzymes No results for input(s): TROPONINI, PROBNP in the last 168 hours.   Glucose  Recent Labs  Lab 07/27/14 1456  GLUCAP 111*    Imaging No results found.   ASSESSMENT / PLAN:  PULMONARY OETT 7/20 >>  A: Acute Respiratory Failure - in the setting of medication reaction leading to cardiac arrest P:   MV support, 8cc/kg Follow up ABG post intubation  Intermittent CXR  Albuterol PRN   CARDIOVASCULAR CVL  A:  Cardiac Arrest - suspected secondary to cetuximab (see black box warnings) Hypotension  Hx CAD, CABG, PVD, L [place] Arterial Stenosis, HLD, HTN P:  Levophed as needed to support MAP > 51 Hold home medications  ICU monitoring of hemodynamics Treat as anaphylactic reaction with pepcid, benadryl and solu-medrol   RENAL A:   At Risk AKI - in setting of cardiac arrest  P:   Trend BMP / UOP  Replace electrolytes as indicated   GASTROINTESTINAL A:   Vent Associated Dysphagia  P:   SUP:  Pepcid  NPO  OGT PRN dulcolax for narcotic associated constipation   HEMATOLOGIC /  ONCOLOGY A:   Anemia - suspect of chronic disease, no acute bleeding  Invasive Squamous Cell of Larynx  P:  Trend CBC  Lovenox for DVT prophylaxis  PRBC's for Hgb <7% ONC aware of patients admission, appreciate input  INFECTIOUS A:   No evidence of acute infectious process P:   Monitor fever curve / leukocytosis   ENDOCRINE A:   No acute issues  P:   Monitor glucose on BMP, if consistently greater than 180   NEUROLOGIC A:   Acute Encephalopathy - mild, post cardiac arrest  P:   RASS goal: -1 PRN fentanyl   FAMILY  - Updates: Daughter updated by Dr. [place] Bells via phone.    - Inter-disciplinary family meet or Palliative Care meeting due by:  08/09/14   Noe Gens, NP-C [facility] Pgr: [phone] or if no answer [phone] 08/02/2014, 10:02 AM    Attending:  I have seen and examined the patient with nurse practitioner/resident and agree with the note above.   [name] had a cardiac arrest today with cetuximab.    On exam he is following commands, has a normal dermatologic exam with the exception of tattoos, and has a normal heart and lung exam  Sudden cardiac arrest has been described in 2-3% of patients with SCC of the head and neck.  In discussing the case with Dr. Alvy Bimler, it sounds like this is typically a hypersensitivity/anaphylactic type reaction.  Fortunately it seems there is no angio edema or hives.  In my review of the literature (case report and review: J Oncol Pharm Pract September 2013 vol. 19 no. 3 [phone]) it seems like this is believed to be an IgE mediated response.    We will support him with mechanical ventilation overnight, give solumedrol and histamine blockade to treat as an anaphylactic type syndrome.  I called his daughter and discussed the situation.  My cc time 45 minutes  Roselie Awkward, MD [facility] Pager: [phone] Cell: [phone] After 3pm or if no response, call [phone]

## 2014-08-02 NOTE — Care Management Note (Signed)
Case Management Note  Patient Details  Name: [name] MRN: [id number] Date of Birth: 1955/10/22  Subjective/Objective:              S/p cardiac arrest and resucitation      Action/Plan: tbd   Expected Discharge Date:                  Expected Discharge Plan:  Home w Hospice Care  In-House Referral:  Clinical Social Work  Discharge planning Services  CM Consult  Post Acute Care Choice:  NA, Hospice Choice offered to:  NA  DME Arranged:  N/A DME Agency:  NA  HH Arranged:  NA HH Agency:  NA  Status of Service:  In process, will continue to follow  Medicare Important Message Given:    Date Medicare IM Given:    Medicare IM give by:    Date Additional Medicare IM Given:    Additional Medicare Important Message give by:     If discussed at [place] of Stay Meetings, dates discussed:    Additional Comments:  Leeroy Cha, RN 08/02/2014, 11:09 AM

## 2014-08-02 NOTE — Progress Notes (Signed)
Oncology Nurse Navigator Documentation   Navigator Encounter Type: Other (08/02/14 1645)   Visited patient to check on his well being at [facility] ICU s/p cardiac arrest in Infusion.  He was minimally responsive but opened eyes, recognized my presence, squeezed my hand.  I will continue to follow during this admission.   [name], RN, BSN, [place] at [place] [phone]

## 2014-08-02 NOTE — Progress Notes (Signed)
Patient noted to be restless and agitated approximately 10 minutes after starting Erbitux infusion. Patient was pulling at clothes and moving limbs. RN asked patient if he was ok and patient shook head no. RN noted patients face and neck were red. Erbitux infusion stopped immediately at 0915 and hypersensitivity protocol was implemented. Saline line started wide open. O2 applied via nasal cannula. Vitals checked, unable to obtain BP by machine. Solu-medrol [phone]) and benadryl (0370) were given per hypersensitivity protocol. Selena Lesser, NP then arrived to patient's bedside. Patient became unresponsive, eyes open, but would not answer questions or follow commands. Epipen autoinjector given at [phone]. Code Blue was called. Zoll was applied, heart rate was detected, no shock advised. He further deteriorated and developed a cyanotic complexion and agonal breathing. Assistance with respirations was started with ambu bag at 0926. Pulse was weak but present. The code team arrived and assumed care at 0929.

## 2014-08-02 NOTE — Progress Notes (Signed)
During rounds chaplain visited patient and family members. Chaplain provided a listening presence.  Patient was awake wanting a pad and pencil to write a note. Family member asked questions concerning Advance Directive. Chaplain  prayed a silent prayer and answered questions concerning the Advance Directive.   08/02/14 1600  Clinical Encounter Type  Visited With Patient and family together  Visit Type Follow-up;Spiritual support;Social support  Referral From [employer] will continue to follow.

## 2014-08-02 NOTE — Progress Notes (Signed)
Date:  August 02, 2014 U.R. performed for needs and level of care. Will continue to follow for Case Management needs.  Rhonda Davis, RN, BSN, CCM   [phone] 

## 2014-08-02 NOTE — Anesthesia Procedure Notes (Signed)
Procedure Name: Intubation Date/Time: 08/02/2014 9:45 AM Performed by: Lollie Sails Pre-anesthesia Checklist: Patient identified, Emergency Drugs available, Suction available and Patient being monitored Preoxygenation: Pre-oxygenation with 100% oxygen Ventilation: Mask ventilation without difficulty Laryngoscope Size: Miller and 3 Grade View: Grade II Tube type: Subglottic suction tube Tube size: 8.0 mm Number of attempts: 1 Airway Equipment and Method: Stylet Placement Confirmation: ETT inserted through vocal cords under direct vision,  breath sounds checked- equal and bilateral and CO2 detector Secured at: 22 cm Tube secured with: Tape Dental Injury: Teeth and Oropharynx as per pre-operative assessment  Comments: DL revealed abnormal anatomy to laryngeal opening but able to pass tube through to trachea.   +ETCO2 noted.   Breath sounds checked by three people.    Pulse oximeter reading 100%.  No damage to teeth/lips noted.

## 2014-08-02 NOTE — Progress Notes (Signed)
RT was called to cancer center for code blue. Pt was intubated by CRNA and RT assisted. Pt was brought to ED; RT will follow orders as given.

## 2014-08-02 NOTE — Progress Notes (Signed)
Spiritual Care Note  Responded to code blue in infusion room, providing spiritual, emotional, and logistical support to daughter [name] and son [name].  Provided pastoral presence while they waited in distress for medical updates.  Served as Dentist between family and medical team.  Accompanied [name] and [name] to [name] ED following pt's transfer.  Introduced them to [employer] at [place] for further inpatient support.   Per [name], she moved back in with pt to help him and to care for younger children after pt's heart attack.    Family is aware of ongoing chaplain availability, and Spiritual Care will follow for support, but please also page as needs arise.  Thank you.  Chaplain Lorrin Jackson, [place] Pager:  [phone] Voicemail:  [phone]  [facility] 24/7 Pager:  [phone]

## 2014-08-02 NOTE — Consult Note (Signed)
[place]  [phone]   Patient Care Team: [name], NP as PCP - General [name], RN as Oncology Nurse Navigator [name], MD as Consulting Physician (Radiation Oncology) [name], MD as Consulting Physician (Hematology and Oncology) [name], RD as Dietitian (Nutrition)  HOSPITAL CONSULT  NOTE  I have seen the patient, examined him and edited the notes as follows  HPI: [name] is a 59 year old man with a history of Squamous Cell Carcinoma of the larynx, recently diagnosed, who was to initiate his chemotherapy treatments with cetuximab. Status was complicated by likely infusion related cardiac arrest at the [facility], despite premedication with Benadryl. He was intubated, resuscitated. He was admitted to the ICU for management of his immediate symptoms.  No family members are in his room, and patient is unable to provide information at this time. Patient is under the care of Critical Care Medicine team.  He remained intubated but appeared to be alert and awake.  Oncology History   Squamous cell carcinoma of larynx   Staging form: Larynx - Supraglottis, AJCC 7th Edition     Clinical stage from 07/20/2014: Stage III (T3, N0, M0) - Signed by [name], MD on 08/01/2014       Squamous cell carcinoma of larynx   06/22/2014 Imaging CT scan showed greater than 2 cm supraglottic squamous cell carcinoma of the larynx with contralateral extension. No regional pathologic adenopathy   07/12/2014 Pathology Results Accession: [id number] biopsy of larynx show squamous cell carcinoma   07/12/2014 Surgery Laryngoscopy revealed a large exophytic tumor arising from the left supraglottic larynx, aryepiglottic fold. It extended from the anterior commissure and the epiglottis anteriorly, to the arytenoid posteriorly. The entire aryepiglottic fold was involved   07/27/2014 Imaging Staging PET:  1. Intense metabolic activity associated with the  left laryngeal mass.  2. Single mildly hypermetabolic small left level 2 lymph node.  No additional evidence of metastatic adenopathy in neck.  3. No evidence of thoracic metastasis.   07/28/2014 Procedure Port-a-cath placed.   08/02/14 Chemo He was to start C1D1 chemo with cituximab but due to anaphylaxis, this was discontinued   08/02/14 Hospitalization Patient was hospitalized due to cardiac arrest,requiring intubation and resuscitation, suspected chemo related.      MEDICATIONS: Scheduled Meds: . diphenhydrAMINE  25 mg Intravenous Once  . enoxaparin (LOVENOX) injection  40 mg Subcutaneous Q24H  . famotidine (PEPCID) IV  20 mg Intravenous Q12H  . methylPREDNISolone (SOLU-MEDROL) injection  60 mg Intravenous Q12H   Continuous Infusions: . norepinephrine (LEVOPHED) Adult infusion     PRN Meds:.sodium chloride, acetaminophen, albuterol, bisacodyl, fentaNYL (SUBLIMAZE) injection, fentaNYL (SUBLIMAZE) injection, ondansetron (ZOFRAN) IV  ALLERGIES:   Allergies  Allergen Reactions  . Cetuximab Anaphylaxis    Cardiac arrest 08/02/2014     PHYSICAL EXAMINATION:  Filed Vitals:   08/02/14 1115  BP: 100/75  Pulse: 81  Temp: 97.7 F (36.5 C)  Resp: 18   Filed Weights   08/02/14 1115  Weight: 182 lb 1.6 oz (82.6 kg)    GENERAL: intubated, appeared alert and awake, not sedated SKIN: skin color, texture, turgor are pale, no rashes or significant lesions. Several tattoos EYES: normal, conjunctiva are pink and non-injected, sclera clear OROPHARYNX:unable to assess due to intubation NECK: supple, thyroid normal size, non-tender, without nodularity LYMPH:  no palpable lymphadenopathy in the cervical, axillary or inguinal LUNGS: clear to auscultation and percussion with normal breathing effort HEART: regular rate & rhythm and  no murmurs and no lower extremity edema. Right port a cath present. No apparent tenderness. ABDOMEN: soft, non-tender and normal bowel sounds Musculoskeletal:no  cyanosis of digits and no clubbing  PSYCH: alert & oriented x 3 with fluent speech NEURO: no focal motor/sensory deficits   LABORATORY/RADIOLOGY DATA:   Recent Labs Lab 07/28/14 1230 08/01/14 1223 08/02/14 0954  WBC 11.8* 8.7 12.2*  HGB 10.7* 10.6* 11.8*  HCT 32.9* 32.8* 37.3*  PLT 379 363 324  MCV 91.6 93.2 95.4  MCH 29.8 30.1 30.2  MCHC 32.5 32.3 31.6  RDW 14.6 14.5 14.5  LYMPHSABS 2.8 2.6 4.5*  MONOABS 1.3* 1.2* 0.5  EOSABS 0.2 0.2 0.0  BASOSABS 0.0 0.0 0.0    CMP    Recent Labs Lab 08/01/14 1223 08/02/14 0954  NA 137 137  K 4.3 4.3  CL  --  105  CO2 28 23  GLUCOSE 101 222*  BUN 14.1 17  CREATININE 1.1 1.26*  CALCIUM 9.3 8.0*  MG 2.3  --   AST 26  --   ALT 25  --   ALKPHOS 118  --   BILITOT 0.45  --         Component Value Date/Time   BILITOT 0.45 08/01/2014 1223   BILITOT 0.3 06/22/2014 1729   BILIDIR 0.2 06/20/2013 1050   IBILI 0.5 06/20/2013 1050     Recent Labs Lab 07/28/14 1230  INR 0.94    Liver Function Tests:  Recent Labs Lab 08/01/14 1223  AST 26  ALT 25  ALKPHOS 118  BILITOT 0.45  PROT 6.7  ALBUMIN 3.5    CBG:  Recent Labs Lab 07/27/14 1456  GLUCAP 111*    Radiology Studies:  Dg Chest Portable 1 View  08/02/2014   CLINICAL DATA:  Status post CPR, intubation  EXAM: PORTABLE CHEST - 1 VIEW  COMPARISON:  None.  FINDINGS: Endotracheal tube with the tip 4 cm above the carina. Right-sided Port-A-Cath in satisfactory position.  Mild bilateral interstitial prominence. There is no focal parenchymal opacity. There is no pleural effusion or pneumothorax. The heart and mediastinal contours are unremarkable. Prior CABG.  The osseous structures are unremarkable.  IMPRESSION: 1. Endotracheal tube with the tip 4 cm above the carina.   Electronically Signed   By: Kathreen Devoid   On: 08/02/2014 10:26   ASSESSMENT AND PLAN:  Squamous Cell Carcinoma of the Larynx S/p simulation on 7/13 for Intensity Modulated Radiotherapy (IMRT)  medically necessary for parotid sparing which results in a gain of quality of life by preservation of salivary gland function. In addition the tumor volume is in close proximity to the spinal cord.  He was to initiate C1 D1 concurrent chemo with cetuximab Suspected drug induced reaction, resulting in cardiac arrest, chemo discontinued He was intubated and resuscitated I have informed the radiation oncologist that I am not willing to proceed with further systemic treatment in the future. I also told the patient's and his daughter that he will still have good benefit with radiation alone.  Acute Respiratory Failure Cardiac Arrest Suspected medication related, in the setting of first cycle of chemotherapy with Cetuximab at the [facility] He was intubated and resuscitated. I was present during the resuscitation effort. He was admitted via Emergency Department to the ICU He also has a history of ischemic cardiomyopathy, with most recent EF at 45 percent prior to this event. May need Cardiology follow up while in hospital Appreciate CCM involvement, management Will follow. Hopefully, the patient can be extubated  tomorrow.  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 Will monitor.   DVT prophylaxis On Lovenox  Full Code  Other medical issues as per admitting team Will follow   Rondel Jumbo, PA-C 08/02/2014, 11:39 AM  Aricela Bertagnolli, MD 08/02/2014

## 2014-08-02 NOTE — ED Provider Notes (Addendum)
CSN: [id number]     Arrival date & time 08/02/14  7494 History   First MD Initiated Contact with Patient 08/02/14 [phone]     Chief Complaint  Patient presents with  . Cardiac Arrest  . Medication Reaction     (Consider location/radiation/quality/duration/timing/severity/associated sxs/prior Treatment) HPI Comments: Pt is a 59 y.o. male with Pmhx as above who presents as a code blue called from the cancer center. Pt was initiating treatments in the cancer center today with cetuximab, was in the first five mins of infusion when he became aggitated, then went pulseless and unresponsive. Pt received 2 rounds of CPR in cancer center, as intubated by anesthesia and had ROSC, was transferred to the ED.   Past Medical History  Diagnosis Date  . Carotid stenosis   . Coronary artery disease     s/p CABG 2006; stenting to vein graft 05-2013; NSTEMI 06-2013 in setting of PEA arrest  . Subclavian artery stenosis, left     s/p stenting 2006  . Hypertension   . Hyperlipidemia   . Ischemic cardiomyopathy     echo 06/2013 EF 10-15% (normal 01/2013)  . At risk for sudden cardiac death, has lifevest at discharge 06/20/2013  . Shock liver 06/20/2013  . Acute MI, troponin > 20, no obvious culprit vessel 06/20/2013  . Hypothermia, induced, post arrest 06/20/2013  . Acute encephalopathy, improved 06/20/2013  . Dizziness 06/20/2013  . Weakness due to cardiac arrest 06/20/2013  . Fever, possible aspiration-treated with antibiotics 06/20/2013  . Tobacco abuse     >100 pack year history   . Dyslipidemia 10/09/2013    [place] Study atorvastatin -eIRB # [id number] tablet Take 40 mg by mouth daily.   Marland Kitchen PAD (peripheral artery disease) 10/09/2013    Occluded left common carotid and moderate right internal carotid artery stenosis. Previous stent to the left subclavian artery. Right iliac stent. Bilateral 60-99% renal artery stenosis    . Heart failure, acute on chronic, systolic and diastolic 59/16/3846  . Anxiety   . GERD (gastroesophageal  reflux disease)   . 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 Angioplasty  05/2013    stent to SVG - LAD vein graft at [facility]  . Coronary artery bypass graft  09/2004    SVG to LAD (at [facility])  . Nm myocar perf wall motion  06/2008    persantine myoview - normal pattern of systolic thickening/wall motion/perfusion;  . Lower extremity arterial doppler  08/2008    right and left ABI - mild arterial insufficiency at rest; R CIA/stent with 50-69% narrowing, L CIA with increased velocities (50-69% diameter reduction)  . Renal artery doppler  05/2008    right and left prox renal arteries with narrowing and increased velocities (60-99% diameter reduction)  . Iliac artery stent  07/24/2008    8x6 Smart nitinol self-expanding stent to origin of ilaci down into external iliac crossing the hypogastric artery (Dr. Adora Fridge)  . Subclavian angiogram Left 08/20/2004    8x40mm Genesis balloon mounted stent  . Left heart catheterization with coronary/graft angiogram N/A 06/15/2013    Procedure: LEFT HEART CATHETERIZATION WITH Beatrix Fetters;  Surgeon: Sinclair Grooms, MD;  Location: [facility] CATH LAB;  Service: Cardiovascular;  Laterality: N/A;  . Cardiac catheterization    . Direct laryngoscopy N/A 07/06/2014    Procedure: DIRECT LARYNGOSCOPY AND ESOPAGOSCOPY WITH BIOPSY;  Surgeon: [name], MD;  Location: [facility];  Service: ENT;  Laterality: N/A;  Family History  Problem Relation Age of Onset  . Heart disease Maternal Aunt   . Heart attack Mother   . Stroke Mother   . Cancer Father   . Stroke Sister   . Heart Problems Sister    History  Substance Use Topics  . Smoking status: Former Smoker -- 4.00 packs/day for 45 years    Types: Cigarettes    Quit date: 06/09/2013  . Smokeless tobacco: Former Systems developer     Comment: tried for a few weeks  . Alcohol Use: 12.6 - 25.2 oz/week    21-42 Cans of beer per week     Comment: 3-6 12 oz cans daily      Review of Systems  Unable to perform ROS     Allergies  Cetuximab  Home Medications   Prior to Admission medications   Medication Sig Start Date End Date Taking? Authorizing Provider  acetaminophen (TYLENOL) 500 MG tablet Take 500 mg by mouth every 6 (six) hours as needed for moderate pain.    Yes Historical Provider, MD  ALPRAZolam Duanne Moron) 0.5 MG tablet Take 0.5 mg by mouth 3 (three) times daily as needed for anxiety.    Yes Historical Provider, MD  aspirin EC 81 MG tablet Take 81 mg by mouth daily.   Yes Historical Provider, MD  buPROPion (WELLBUTRIN XL) 300 MG 24 hr tablet Take 300 mg by mouth daily.   Yes Historical Provider, MD  carvedilol (COREG) 12.5 MG tablet TAKE 1 TABLET BY MOUTH TWICE DAILY. 07/10/14  Yes Mihai Croitoru, MD  KRILL OIL PO Take 1 capsule by mouth daily.   Yes Historical Provider, MD  lidocaine-prilocaine (EMLA) cream Apply to affected area once Patient taking differently: Apply 1 application topically as directed. Apply to affected area once 08/01/14  Yes [name], MD  Multiple Vitamins-Minerals (MULTIVITAMIN GUMMIES ADULT) CHEW Chew 1 each by mouth 2 (two) times daily.   Yes Historical Provider, MD  nitroGLYCERIN (NITROSTAT) 0.4 MG SL tablet Place 0.4 mg under the tongue every 5 (five) minutes as needed for chest pain.   Yes Historical Provider, MD  oxyCODONE 10 MG TABS Take 1 tablet (10 mg total) by mouth every 4 (four) hours as needed for severe pain. 07/20/14  Yes [name], MD  pantoprazole (PROTONIX) 40 MG tablet TAKE 1 TABLET BY MOUTH EVERY DAY 04/17/14  Yes Mihai Croitoru, MD  potassium chloride SA (K-DUR,KLOR-CON) 20 MEQ tablet Take 1 tablet (20 mEq total) by mouth daily. 12/26/13  Yes Mihai Croitoru, MD  fentaNYL (DURAGESIC - DOSED MCG/HR) 25 MCG/HR patch Place 1 patch (25 mcg total) onto the skin every 3 (three) days. Patient not taking: Reported on 08/02/2014 08/01/14   [name], MD  morphine (ROXANOL) 20 MG/ML concentrated solution Take 0.5  mLs (10 mg total) by mouth every 2 (two) hours as needed for severe pain. Patient not taking: Reported on 08/02/2014 08/01/14   [name], MD   BP 136/82 mmHg  Pulse 104  Temp(Src) 98.4 F (36.9 C) (Oral)  Resp 16  Ht 5\' 7"  (1.702 m)  Wt 179 lb 10.8 oz (81.5 kg)  BMI 28.13 kg/m2  SpO2 97% Physical Exam  Constitutional: He appears distressed.  Cardiovascular: Bradycardia present.   Neurological: He is unresponsive. GCS eye subscore is 1. GCS verbal subscore is 5. GCS motor subscore is 5.  Intubated, reaching for tube with both hands    ED Course  Procedures (including critical care time) Labs Review Labs Reviewed  CBC WITH DIFFERENTIAL/PLATELET -  Abnormal; Notable for the following:    WBC 12.2 (*)    RBC 3.91 (*)    Hemoglobin 11.8 (*)    HCT 37.3 (*)    Lymphs Abs 4.5 (*)    All other components within normal limits  BASIC METABOLIC PANEL - Abnormal; Notable for the following:    Glucose, Bld 222 (*)    Creatinine, Ser 1.26 (*)    Calcium 8.0 (*)    GFR calc non Af Amer 60 (*)    All other components within normal limits  BLOOD GAS, ARTERIAL - Abnormal; Notable for the following:    pH, Arterial 7.304 (*)    pO2, Arterial 465 (*)    Acid-base deficit 5.3 (*)    All other components within normal limits  CBC - Abnormal; Notable for the following:    WBC 14.9 (*)    RBC 3.52 (*)    Hemoglobin 10.6 (*)    HCT 32.1 (*)    All other components within normal limits  BASIC METABOLIC PANEL - Abnormal; Notable for the following:    Glucose, Bld 175 (*)    Calcium 8.3 (*)    All other components within normal limits  CG4 I-STAT (LACTIC ACID) - Abnormal; Notable for the following:    Lactic Acid, Venous 4.64 (*)    All other components within normal limits  MRSA PCR SCREENING  I-STAT TROPOININ, ED  I-STAT CG4 LACTIC ACID, ED  POCT I-STAT TROPONIN I    Imaging Review Dg Chest Port 1 View  08/03/2014   CLINICAL DATA:  Respiratory failure.  Endotracheal tube position.   EXAM: PORTABLE CHEST - 1 VIEW  COMPARISON:  08/02/2014.  FINDINGS: 0441 hr. Endotracheal is approximately 3 cm above the carina. Nasogastric tube projects below the diaphragm, tip not visualized. There is a right IJ central venous catheter with its tip at the lower SVC level. The heart size and mediastinal contours are stable status post CABG. There are lower lung volumes with mildly increased left lower lobe atelectasis. No pneumothorax or significant pleural effusion identified. The bones appear unchanged.  IMPRESSION: Little change in position of the support system. Mildly increased left basilar atelectasis.   Electronically Signed   By: Richardean Sale M.D.   On: 08/03/2014 07:11   Dg Chest Portable 1 View  08/02/2014   CLINICAL DATA:  Status post CPR, intubation  EXAM: PORTABLE CHEST - 1 VIEW  COMPARISON:  None.  FINDINGS: Endotracheal tube with the tip 4 cm above the carina. Right-sided Port-A-Cath in satisfactory position.  Mild bilateral interstitial prominence. There is no focal parenchymal opacity. There is no pleural effusion or pneumothorax. The heart and mediastinal contours are unremarkable. Prior CABG.  The osseous structures are unremarkable.  IMPRESSION: 1. Endotracheal tube with the tip 4 cm above the carina.   Electronically Signed   By: Kathreen Devoid   On: 08/02/2014 10:26     EKG Interpretation   Date/Time:  Wednesday August 02 2014 09:49:46 EDT Ventricular Rate:  98 PR Interval:  148 QRS Duration: 106 QT Interval:  386 QTC Calculation: 493 R Axis:   -60 Text Interpretation:  Sinus rhythm Left anterior fascicular block Abnormal  R-wave progression, early transition Borderline prolonged QT interval  Confirmed by DOCHERTY  MD, MEGAN ([id number]) on 08/02/2014 9:54:20 AM      Cardiopulmonary Resuscitation (CPR) Procedure Note Directed/Performed by: Ernestina Patches, E I personally directed ancillary staff and/or performed CPR in an effort to regain return of spontaneous circulation  and  to maintain cardiac, neuro and systemic perfusion.     MDM   Final diagnoses:  Cardiac arrest    Pt is a 59 y.o. male with Pmhx as above who presents as a code blue called from the cancer center. Pt was initiating treatments in the cancer center today with cetuximab, was in the first five mins of infusion when he became aggitated, then went pulseless and unresponsive. Pt received 2 rounds of CPR in cancer center, as intubated by anesthesia and had ROSC, was transferred to the ED.  In ED, IVF resuscitation initiated, EET placement confirmed. Marland Kitchen CCM consulted and assumed care. Pt reaching for ETT at time of their arrival.      Ernestina Patches, MD 08/03/14 2694  Ernestina Patches, MD 08/11/14 [phone]

## 2014-08-02 NOTE — Progress Notes (Signed)
[facility] Progress Note Patient Name: [name] DOB: 06/11/1955 MRN: [id number]   Date of Service  08/02/2014  HPI/Events of Note  RN reports patient takes Xanax at home.   eICU Interventions  Ativan IV prn anxiety.     Intervention Category Minor Interventions: Agitation / anxiety - evaluation and management  Tera Partridge 08/02/2014, 3:14 PM

## 2014-08-03 ENCOUNTER — Inpatient Hospital Stay (HOSPITAL_COMMUNITY): Payer: Medicaid Other

## 2014-08-03 ENCOUNTER — Encounter: Payer: Self-pay | Admitting: *Deleted

## 2014-08-03 DIAGNOSIS — T50905A Adverse effect of unspecified drugs, medicaments and biological substances, initial encounter: Secondary | ICD-10-CM | POA: Insufficient documentation

## 2014-08-03 DIAGNOSIS — Z951 Presence of aortocoronary bypass graft: Secondary | ICD-10-CM | POA: Insufficient documentation

## 2014-08-03 DIAGNOSIS — T887XXD Unspecified adverse effect of drug or medicament, subsequent encounter: Secondary | ICD-10-CM

## 2014-08-03 DIAGNOSIS — I2510 Atherosclerotic heart disease of native coronary artery without angina pectoris: Secondary | ICD-10-CM

## 2014-08-03 LAB — BASIC METABOLIC PANEL
Anion gap: 8 (ref 5–15)
BUN: 18 mg/dL (ref 6–20)
CO2: 22 mmol/L (ref 22–32)
Calcium: 8.3 mg/dL — ABNORMAL LOW (ref 8.9–10.3)
Chloride: 107 mmol/L (ref 101–111)
Creatinine, Ser: 1.06 mg/dL (ref 0.61–1.24)
GFR calc Af Amer: >60 mL/min (ref >60)
Glucose, Bld: 175 mg/dL — ABNORMAL HIGH (ref 65–99)
Potassium: 4.2 mmol/L (ref 3.5–5.1)
Sodium: 137 mmol/L (ref 135–145)

## 2014-08-03 LAB — CBC
HCT: 32.1 % — ABNORMAL LOW (ref 39.0–52.0)
Hemoglobin: 10.6 g/dL — ABNORMAL LOW (ref 13.0–17.0)
MCH: 30.1 pg (ref 26.0–34.0)
MCHC: 33 g/dL (ref 30.0–36.0)
MCV: 91.2 fL (ref 78.0–100.0)
Platelets: 327 10*3/uL (ref 150–400)
RBC: 3.52 MIL/uL — ABNORMAL LOW (ref 4.22–5.81)
RDW: 14.6 % (ref 11.5–15.5)
WBC: 14.9 10*3/uL — ABNORMAL HIGH (ref 4.0–10.5)

## 2014-08-03 MED ORDER — DIPHENHYDRAMINE-ZINC ACETATE 2-0.1 % EX CREA
TOPICAL_CREAM | Freq: Two times a day (BID) | CUTANEOUS | Status: DC | PRN
  Filled 2014-08-03: qty 28

## 2014-08-03 MED ORDER — SODIUM CHLORIDE 0.9 % IV SOLN
INTRAVENOUS | Status: DC
  Administered 2014-08-03: 1000 mL via INTRAVENOUS

## 2014-08-03 MED ORDER — FENTANYL CITRATE (PF) 100 MCG/2ML IJ SOLN: 12.5000 ug | INTRAMUSCULAR | Status: DC | PRN

## 2014-08-03 MED ORDER — ASPIRIN EC 81 MG PO TBEC
81.0000 mg | DELAYED_RELEASE_TABLET | Freq: Every day | ORAL | Status: DC
  Administered 2014-08-03 – 2014-08-04 (×2): 81 mg via ORAL
  Filled 2014-08-03: qty 1

## 2014-08-03 MED ORDER — CARVEDILOL 12.5 MG PO TABS
12.5000 mg | ORAL_TABLET | Freq: Two times a day (BID) | ORAL | Status: DC
  Administered 2014-08-03 – 2014-08-04 (×3): 12.5 mg via ORAL
  Filled 2014-08-03 (×3): qty 1

## 2014-08-03 MED ORDER — ALPRAZOLAM 0.5 MG PO TABS
0.5000 mg | ORAL_TABLET | Freq: Three times a day (TID) | ORAL | Status: DC | PRN
  Administered 2014-08-03: 0.5 mg via ORAL
  Filled 2014-08-03: qty 1

## 2014-08-03 MED ORDER — BUPROPION HCL ER (XL) 300 MG PO TB24
300.0000 mg | ORAL_TABLET | Freq: Every day | ORAL | Status: DC
  Administered 2014-08-03 – 2014-08-04 (×2): 300 mg via ORAL
  Filled 2014-08-03 (×2): qty 1

## 2014-08-03 MED ORDER — FENTANYL 25 MCG/HR TD PT72
25.0000 ug | MEDICATED_PATCH | TRANSDERMAL | Status: DC
  Administered 2014-08-03: 25 ug via TRANSDERMAL
  Filled 2014-08-03: qty 1

## 2014-08-03 NOTE — Progress Notes (Signed)
  Oncology Nurse Navigator Documentation   Navigator Encounter Type: Other (08/03/14 1050)   Visited patient in [place] to check on well-being.  He was alert, oriented, on [place] s/p extubation earlier this morning.  I spoke with family members, provided support to them.     [name], RN, BSN, [place] at [place] [phone]

## 2014-08-03 NOTE — Procedures (Signed)
Extubation Procedure Note  Patient Details:   Name: [name] DOB: [birth year] MRN: [id number]   Airway Documentation:     Evaluation  O2 sats: stable throughout Complications: No apparent complications Patient did tolerate procedure well. Bilateral Breath Sounds: Clear Suctioning: Airway Yes   Patient had a positive cuff leak and was confirmed by Dr. Pennie Banter. Patient is currently on 3 L nasal cannula and no distress is noted at this time. RT will continue to monitor patient.   Lamonte Sakai 08/03/2014, 8:41 AM

## 2014-08-03 NOTE — Progress Notes (Signed)
PULMONARY / CRITICAL CARE MEDICINE   Name: [name] MRN: [id number] DOB: 02/07/1955    ADMISSION DATE:  08/02/2014 CONSULTATION DATE:  08/02/14  REFERRING MD :  Dr. 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  PCCM called for ICU admit.    STUDIES:    SIGNIFICANT EVENTS: 7/20  Admit after cetuximab post infusion cardiac arrest    SUBJECTIVE:  No acute events, feels OK this morning on vent  VITAL SIGNS: Temp:  [97.7 F (36.5 C)-99.6 F (37.6 C)] 98.2 F (36.8 C) (07/21 0400) Pulse Rate:  [37-105] 84 (07/21 0744) Resp:  [16-27] 18 (07/21 0744) BP: (68-153)/(26-86) 126/66 mmHg (07/21 0744) SpO2:  [96 %-100 %] 98 % (07/21 0744) FiO2 (%):  [35 %-100 %] 35 % (07/21 0744) Weight:  [179 lb 10.8 oz (81.5 kg)-182 lb 1.6 oz (82.6 kg)] 179 lb 10.8 oz (81.5 kg) (07/21 0250)   HEMODYNAMICS:     VENTILATOR SETTINGS: Vent Mode:  [-] PRVC FiO2 (%):  [35 %-100 %] 35 % Set Rate:  [18 bmp] 18 bmp Vt Set:  [530 mL] 530 mL PEEP:  [5 cmH20] 5 cmH20 Plateau Pressure:  [10 cmH20-22 cmH20] 14 cmH20   INTAKE / OUTPUT:  Intake/Output Summary (Last 24 hours) at 08/03/14 0813 Last data filed at 08/03/14 0600  Gross per 24 hour  Intake 1256.52 ml  Output   1000 ml  Net 256.52 ml    PHYSICAL EXAMINATION: General:  Comfortable on vent HENT: NCAT ETT in place PULM: CTA B CV: RRR, no mgr GI: BS+, soft nontender MSK: normal bulk and tone Neuro: Awake and alert, following commands  LABS:  CBC  Recent Labs Lab 08/01/14 1223 08/02/14 0954 08/03/14 0600  WBC 8.7 12.2* 14.9*  HGB 10.6* 11.8* 10.6*   HCT 32.8* 37.3* 32.1*  PLT 363 324 327   Coag's  Recent Labs Lab 07/28/14 1230  APTT 32  INR 0.94   BMET  Recent Labs Lab 08/01/14 1223 08/02/14 0954 08/03/14 0600  NA 137 137 137  K 4.3 4.3 4.2  CL  --  105 107  CO2 28 23 22   BUN 14.1 17 18   CREATININE 1.1 1.26* 1.06  GLUCOSE 101 222* 175*   Electrolytes  Recent Labs Lab 08/01/14 1223 08/02/14 0954 08/03/14 0600  CALCIUM 9.3 8.0* 8.3*  MG 2.3  --   --    Sepsis Markers  Recent Labs Lab 08/02/14 1014  LATICACIDVEN 4.64*     ABG  Recent Labs Lab 08/02/14 1020  PHART 7.304*  PCO2ART 42.1  PO2ART 465*     Liver Enzymes  Recent Labs Lab 08/01/14 1223  AST 26  ALT 25  ALKPHOS 118  BILITOT 0.45  ALBUMIN 3.5   Cardiac Enzymes No results for input(s): TROPONINI, PROBNP in the last 168 hours.   Glucose  Recent Labs Lab 07/27/14 1456  GLUCAP 111*    Imaging Dg Chest Port 1 View  08/03/2014   CLINICAL DATA:  Respiratory failure.  Endotracheal tube position.  EXAM: PORTABLE CHEST - 1 VIEW  COMPARISON:  08/02/2014.  FINDINGS: 0441 hr. Endotracheal is approximately 3 cm above the  carina. Nasogastric tube projects below the diaphragm, tip not visualized. There is a right IJ central venous catheter with its tip at the lower SVC level. The heart size and mediastinal contours are stable status post CABG. There are lower lung volumes with mildly increased left lower lobe atelectasis. No pneumothorax or significant pleural effusion identified. The bones appear unchanged.  IMPRESSION: Little change in position of the support system. Mildly increased left basilar atelectasis.   Electronically Signed   By: Richardean Sale M.D.   On: 08/03/2014 07:11   Dg Chest Portable 1 View  08/02/2014   CLINICAL DATA:  Status post CPR, intubation  EXAM: PORTABLE CHEST - 1 VIEW  COMPARISON:  None.  FINDINGS: Endotracheal tube with the tip 4 cm above the carina. Right-sided Port-A-Cath in satisfactory position.  Mild  bilateral interstitial prominence. There is no focal parenchymal opacity. There is no pleural effusion or pneumothorax. The heart and mediastinal contours are unremarkable. Prior CABG.  The osseous structures are unremarkable.  IMPRESSION: 1. Endotracheal tube with the tip 4 cm above the carina.   Electronically Signed   By: Kathreen Devoid   On: 08/02/2014 10:26     ASSESSMENT / PLAN:  PULMONARY OETT 7/20 >>  A: Acute Respiratory Failure in setting of cardiac arrest Laryngeal cancer > no difficulty with intubation P:   Check for cuff leak > if OK then extubate Solumedrol x1 more dose today  CARDIOVASCULAR CVL  n/a A:  Cardiac Arrest - suspected secondary to cetuximab (see black box warnings) Hypotension resolved Hx CAD, CABG, PVD, L [place] Arterial Stenosis, HLD, HTN P:  Restart coreg, asa Continue tele monitoring  RENAL A:   No acute issues P:   Trend BMP / UOP  Replace electrolytes as indicated   GASTROINTESTINAL A:   No acute issues P:   Advance diet Stop pepcid  HEMATOLOGIC / ONCOLOGY A:   Anemia - suspect of chronic disease, no acute bleeding  Invasive Squamous Cell of Larynx  P:  Trend CBC  Lovenox for DVT prophylaxis  PRBC's for Hgb <7% ONC following  INFECTIOUS A:   No evidence of acute infectious process P:   Monitor fever curve / leukocytosis   ENDOCRINE A:   No acute issues  P:   Monitor glucose on BMP, if consistently greater than 180   NEUROLOGIC A:   Acute Encephalopathy resolved P:   PRN fentanyl + fentanyl patch   FAMILY  - Updates: Daughter updated by Dr. Lake Bells bedside  - Inter-disciplinary family meet or Palliative Care meeting due by:  08/09/14   My cc time 35 minutes  Roselie Awkward, MD [place] [facility] Pager: [phone] Cell: [phone] After 3pm or if no response, call [phone]

## 2014-08-03 NOTE — Progress Notes (Signed)
[name]   DOB:1955-05-08   FO#:[id number]   MVE#:[id number]  Patient Care Team: [name], NP as PCP - General [name], RN as Oncology Nurse Navigator [name], MD as Consulting Physician (Radiation Oncology) [name], MD as Consulting Physician (Hematology and Oncology) [name], RD as Dietitian (Nutrition)  I have seen the patient, examined him and edited the notes as follows  Subjective: Patient seen and examined. He remains intubated, but hemodynamically stable. He is awake, nods to answer simple questions. Denies fevers, chills, night sweats, vision changes, or mucositis. Denies any respiratory complaints. Denies any chest pain or palpitations. Denies lower extremity swelling. Denies nausea, had minimal emesis in NG tube early this morning. Denies heartburn or change in bowel habits. Last bowel movement prior to admission. Denies any dysuria. Denies abnormal skin rashes, or neuropathy. Denies any bleeding issues such as epistaxis, hematemesis, hematuria or hematochezia.   Scheduled Meds: . antiseptic oral rinse  7 mL Mouth Rinse QID  . chlorhexidine  15 mL Mouth Rinse BID  . enoxaparin (LOVENOX) injection  40 mg Subcutaneous Q24H  . famotidine (PEPCID) IV  20 mg Intravenous Q12H  . methylPREDNISolone (SOLU-MEDROL) injection  60 mg Intravenous Q12H   Continuous Infusions: . sodium chloride 50 mL/hr at 08/02/14 1443  . norepinephrine (LEVOPHED) Adult infusion Stopped (08/02/14 2345)   PRN Meds:.sodium chloride, acetaminophen, albuterol, bisacodyl, fentaNYL (SUBLIMAZE) injection, fentaNYL (SUBLIMAZE) injection, LORazepam, ondansetron (ZOFRAN) IV  Objective:  Filed Vitals:   08/03/14 0600  BP: 118/67  Pulse:   Temp:   Resp: 18      Intake/Output Summary (Last 24 hours) at 08/03/14 0654 Last data filed at 08/03/14 0600  Gross per 24 hour  Intake 1256.52 ml  Output   1000 ml  Net 256.52 ml    GENERAL: intubated, alert and awake, not  sedated. Appears comfortable SKIN: skin color, texture, turgor are pale, no rashes or significant lesions. Several tattoos EYES: normal, conjunctiva are pink and non-injected, sclera clear OROPHARYNX:unable to assess due to intubation NECK: supple, thyroid normal size, non-tender, without nodularity LYMPH: no palpable lymphadenopathy in the cervical, axillary or inguinal LUNGS: bilateral expiratory rhonchi, no wheezing or rales. Good air movement.  HEART: regular rate & rhythm and no murmurs and no lower extremity edema. Right port a cath present, non tender. ABDOMEN: soft, non-tender and normal bowel sounds Musculoskeletal:bilateral mitts. PSYCH: alert & oriented. Unable to speak due to intubation. NEURO: no focal motor/sensory deficits    CBG (last 3)  No results for input(s): GLUCAP in the last 72 hours.   Labs:   Recent Labs Lab 07/28/14 1230 08/01/14 1223 08/02/14 0954 08/03/14 0600  WBC 11.8* 8.7 12.2* 14.9*  HGB 10.7* 10.6* 11.8* 10.6*  HCT 32.9* 32.8* 37.3* 32.1*  PLT 379 363 324 327  MCV 91.6 93.2 95.4 91.2  MCH 29.8 30.1 30.2 30.1  MCHC 32.5 32.3 31.6 33.0  RDW 14.6 14.5 14.5 14.6  LYMPHSABS 2.8 2.6 4.5*  --   MONOABS 1.3* 1.2* 0.5  --   EOSABS 0.2 0.2 0.0  --   BASOSABS 0.0 0.0 0.0  --      Chemistries:    Recent Labs Lab 08/01/14 1223 08/02/14 0954 08/03/14 0600  NA 137 137 137  K 4.3 4.3 4.2  CL  --  105 107  CO2 28 23 22   GLUCOSE 101 222* 175*  BUN 14.1 17 18   CREATININE 1.1 1.26* 1.06  CALCIUM 9.3 8.0* 8.3*  MG 2.3  --   --  AST 26  --   --   ALT 25  --   --   ALKPHOS 118  --   --   BILITOT 0.45  --   --      Imaging Studies:  Dg Chest Portable 1 View  08/02/2014   CLINICAL DATA:  Status post CPR, intubation  EXAM: PORTABLE CHEST - 1 VIEW  COMPARISON:  None.  FINDINGS: Endotracheal tube with the tip 4 cm above the carina. Right-sided Port-A-Cath in satisfactory position.  Mild bilateral interstitial prominence. There is no focal  parenchymal opacity. There is no pleural effusion or pneumothorax. The heart and mediastinal contours are unremarkable. Prior CABG.  The osseous structures are unremarkable.  IMPRESSION: 1. Endotracheal tube with the tip 4 cm above the carina.   Electronically Signed   By: Kathreen Devoid   On: 08/02/2014 10:26    Assessment/Plan: 59 y.o.  Squamous Cell Carcinoma of the Larynx S/p simulation on 7/13 for Intensity Modulated Radiotherapy (IMRT) medically necessary for parotid sparing which results in a gain of quality of life by preservation of salivary gland function. In addition the tumor volume is in close proximity to the spinal cord.  He was to initiate C1 D1 concurrent chemo with cetuximab Suspected drug induced reaction, resulting in cardiac arrest, chemo discontinued He was intubated and resuscitated Radiation oncologist has been informed that we are not willing to proceed with further systemic treatment in the future. Patient and his daughter were also informed that he will still have good benefit with radiation alone.  Acute Respiratory Failure Cardiac Arrest Suspected medication related, in the setting of first cycle of chemotherapy with Cetuximab at the [facility] He was intubated and resuscitated. Dr. Alvy Bimler was present during the resuscitation effort. He was admitted via Emergency Department to the ICU He also has a history of ischemic cardiomyopathy, with most recent EF at 45 percent prior to this event. May need Cardiology follow up while in hospital Appreciate CCM involvement, management Hopefully, the patient can be extubated today Will follow.  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 Med regimen is adequate Will monitor.   DVT prophylaxis On Lovenox  Full Code Other medical issues as per admitting team   Hopefully DC tomorrow Plan of care discussed with family and critical care service   [facility] E, PA-C 08/03/2014  6:54 AM Alvy Bimler, Cordney [place]stow, MD 08/03/2014

## 2014-08-04 ENCOUNTER — Telehealth: Payer: Self-pay | Admitting: Pulmonary Disease

## 2014-08-04 ENCOUNTER — Emergency Department (HOSPITAL_COMMUNITY): Payer: Medicaid Other

## 2014-08-04 ENCOUNTER — Other Ambulatory Visit: Payer: Self-pay

## 2014-08-04 ENCOUNTER — Inpatient Hospital Stay (HOSPITAL_COMMUNITY)
Admission: EM | Admit: 2014-08-04 | Discharge: 2014-08-12 | DRG: 853 | Disposition: A | Discharge status: Home Health | Payer: Medicaid Other | Attending: Internal Medicine | Admitting: Internal Medicine

## 2014-08-04 ENCOUNTER — Encounter (HOSPITAL_COMMUNITY): Payer: Self-pay

## 2014-08-04 DIAGNOSIS — A419 Sepsis, unspecified organism: Principal | ICD-10-CM | POA: Diagnosis present

## 2014-08-04 DIAGNOSIS — Z888 Allergy status to other drugs, medicaments and biological substances status: Secondary | ICD-10-CM

## 2014-08-04 DIAGNOSIS — C329 Malignant neoplasm of larynx, unspecified: Secondary | ICD-10-CM | POA: Diagnosis present

## 2014-08-04 DIAGNOSIS — J95821 Acute postprocedural respiratory failure: Secondary | ICD-10-CM | POA: Diagnosis not present

## 2014-08-04 DIAGNOSIS — E876 Hypokalemia: Secondary | ICD-10-CM | POA: Diagnosis present

## 2014-08-04 DIAGNOSIS — K631 Perforation of intestine (nontraumatic): Secondary | ICD-10-CM

## 2014-08-04 DIAGNOSIS — Z9221 Personal history of antineoplastic chemotherapy: Secondary | ICD-10-CM

## 2014-08-04 DIAGNOSIS — Z01818 Encounter for other preprocedural examination: Secondary | ICD-10-CM

## 2014-08-04 DIAGNOSIS — Z978 Presence of other specified devices: Secondary | ICD-10-CM

## 2014-08-04 DIAGNOSIS — K219 Gastro-esophageal reflux disease without esophagitis: Secondary | ICD-10-CM | POA: Diagnosis present

## 2014-08-04 DIAGNOSIS — K5720 Diverticulitis of large intestine with perforation and abscess without bleeding: Secondary | ICD-10-CM | POA: Diagnosis present

## 2014-08-04 DIAGNOSIS — Z791 Long term (current) use of non-steroidal anti-inflammatories (NSAID): Secondary | ICD-10-CM

## 2014-08-04 DIAGNOSIS — R109 Unspecified abdominal pain: Secondary | ICD-10-CM

## 2014-08-04 DIAGNOSIS — I259 Chronic ischemic heart disease, unspecified: Secondary | ICD-10-CM | POA: Diagnosis present

## 2014-08-04 DIAGNOSIS — Z79899 Other long term (current) drug therapy: Secondary | ICD-10-CM

## 2014-08-04 DIAGNOSIS — Z8674 Personal history of sudden cardiac arrest: Secondary | ICD-10-CM

## 2014-08-04 DIAGNOSIS — R778 Other specified abnormalities of plasma proteins: Secondary | ICD-10-CM | POA: Diagnosis present

## 2014-08-04 DIAGNOSIS — Z87891 Personal history of nicotine dependence: Secondary | ICD-10-CM

## 2014-08-04 DIAGNOSIS — Z79891 Long term (current) use of opiate analgesic: Secondary | ICD-10-CM

## 2014-08-04 DIAGNOSIS — Z7982 Long term (current) use of aspirin: Secondary | ICD-10-CM

## 2014-08-04 DIAGNOSIS — Z8249 Family history of ischemic heart disease and other diseases of the circulatory system: Secondary | ICD-10-CM

## 2014-08-04 DIAGNOSIS — I214 Non-ST elevation (NSTEMI) myocardial infarction: Secondary | ICD-10-CM | POA: Diagnosis present

## 2014-08-04 DIAGNOSIS — F419 Anxiety disorder, unspecified: Secondary | ICD-10-CM | POA: Diagnosis present

## 2014-08-04 DIAGNOSIS — I252 Old myocardial infarction: Secondary | ICD-10-CM

## 2014-08-04 DIAGNOSIS — Z951 Presence of aortocoronary bypass graft: Secondary | ICD-10-CM

## 2014-08-04 DIAGNOSIS — K659 Peritonitis, unspecified: Secondary | ICD-10-CM | POA: Diagnosis present

## 2014-08-04 DIAGNOSIS — Z8719 Personal history of other diseases of the digestive system: Secondary | ICD-10-CM | POA: Diagnosis present

## 2014-08-04 DIAGNOSIS — E785 Hyperlipidemia, unspecified: Secondary | ICD-10-CM | POA: Diagnosis present

## 2014-08-04 DIAGNOSIS — R7989 Other specified abnormal findings of blood chemistry: Secondary | ICD-10-CM | POA: Diagnosis present

## 2014-08-04 DIAGNOSIS — I10 Essential (primary) hypertension: Secondary | ICD-10-CM | POA: Diagnosis present

## 2014-08-04 DIAGNOSIS — G893 Neoplasm related pain (acute) (chronic): Secondary | ICD-10-CM | POA: Diagnosis present

## 2014-08-04 DIAGNOSIS — Z955 Presence of coronary angioplasty implant and graft: Secondary | ICD-10-CM

## 2014-08-04 DIAGNOSIS — R6521 Severe sepsis with septic shock: Secondary | ICD-10-CM | POA: Diagnosis present

## 2014-08-04 DIAGNOSIS — I471 Supraventricular tachycardia: Secondary | ICD-10-CM | POA: Diagnosis not present

## 2014-08-04 DIAGNOSIS — I739 Peripheral vascular disease, unspecified: Secondary | ICD-10-CM | POA: Diagnosis present

## 2014-08-04 DIAGNOSIS — I255 Ischemic cardiomyopathy: Secondary | ICD-10-CM | POA: Diagnosis present

## 2014-08-04 DIAGNOSIS — F1010 Alcohol abuse, uncomplicated: Secondary | ICD-10-CM | POA: Diagnosis present

## 2014-08-04 DIAGNOSIS — D630 Anemia in neoplastic disease: Secondary | ICD-10-CM | POA: Diagnosis present

## 2014-08-04 DIAGNOSIS — R34 Anuria and oliguria: Secondary | ICD-10-CM | POA: Diagnosis present

## 2014-08-04 DIAGNOSIS — J438 Other emphysema: Secondary | ICD-10-CM | POA: Diagnosis present

## 2014-08-04 DIAGNOSIS — R52 Pain, unspecified: Secondary | ICD-10-CM

## 2014-08-04 DIAGNOSIS — I2510 Atherosclerotic heart disease of native coronary artery without angina pectoris: Secondary | ICD-10-CM | POA: Diagnosis present

## 2014-08-04 DIAGNOSIS — J449 Chronic obstructive pulmonary disease, unspecified: Secondary | ICD-10-CM | POA: Diagnosis present

## 2014-08-04 LAB — COMPREHENSIVE METABOLIC PANEL
ALBUMIN: 3.3 g/dL — AB (ref 3.5–5.0)
ALT: 43 U/L (ref 17–63)
AST: 40 U/L (ref 15–41)
Alkaline Phosphatase: 73 U/L (ref 38–126)
Anion gap: 11 (ref 5–15)
BUN: 19 mg/dL (ref 6–20)
CO2: 24 mmol/L (ref 22–32)
Calcium: 8.6 mg/dL — ABNORMAL LOW (ref 8.9–10.3)
Chloride: 103 mmol/L (ref 101–111)
Creatinine, Ser: 1.05 mg/dL (ref 0.61–1.24)
GLUCOSE: 109 mg/dL — AB (ref 65–99)
Potassium: 4.1 mmol/L (ref 3.5–5.1)
Sodium: 138 mmol/L (ref 135–145)
TOTAL PROTEIN: 5.9 g/dL — AB (ref 6.5–8.1)
Total Bilirubin: 0.2 mg/dL — ABNORMAL LOW (ref 0.3–1.2)

## 2014-08-04 LAB — CBC WITH DIFFERENTIAL/PLATELET
BASOS PCT: 0 % (ref 0–1)
Basophils Absolute: 0 10*3/uL (ref 0.0–0.1)
Basophils Absolute: 0 10*3/uL (ref 0.0–0.1)
Basophils Relative: 0 % (ref 0–1)
EOS PCT: 0 % (ref 0–5)
Eosinophils Absolute: 0 10*3/uL (ref 0.0–0.7)
Eosinophils Absolute: 0 10*3/uL (ref 0.0–0.7)
Eosinophils Relative: 0 % (ref 0–5)
HCT: 26.7 % — ABNORMAL LOW (ref 39.0–52.0)
HCT: 30.6 % — ABNORMAL LOW (ref 39.0–52.0)
HEMOGLOBIN: 8.8 g/dL — AB (ref 13.0–17.0)
HEMOGLOBIN: 10.1 g/dL — AB (ref 13.0–17.0)
LYMPHS ABS: 1.2 10*3/uL (ref 0.7–4.0)
LYMPHS PCT: 7 % — AB (ref 12–46)
Lymphocytes Relative: 7 % — ABNORMAL LOW (ref 12–46)
Lymphs Abs: 1.1 10*3/uL (ref 0.7–4.0)
MCH: 30.9 pg (ref 26.0–34.0)
MCH: 30.8 pg (ref 26.0–34.0)
MCHC: 33 g/dL (ref 30.0–36.0)
MCHC: 33 g/dL (ref 30.0–36.0)
MCV: 93.7 fL (ref 78.0–100.0)
MCV: 93.3 fL (ref 78.0–100.0)
Monocytes Absolute: 1 10*3/uL (ref 0.1–1.0)
Monocytes Absolute: 1.2 10*3/uL — ABNORMAL HIGH (ref 0.1–1.0)
Monocytes Relative: 6 % (ref 3–12)
Monocytes Relative: 7 % (ref 3–12)
NEUTROS ABS: 13.8 10*3/uL — AB (ref 1.7–7.7)
NEUTROS PCT: 87 % — AB (ref 43–77)
Neutro Abs: 15 10*3/uL — ABNORMAL HIGH (ref 1.7–7.7)
Neutrophils Relative %: 86 % — ABNORMAL HIGH (ref 43–77)
PLATELETS: 312 10*3/uL (ref 150–400)
Platelets: 297 10*3/uL (ref 150–400)
RBC: 2.85 MIL/uL — ABNORMAL LOW (ref 4.22–5.81)
RBC: 3.28 MIL/uL — AB (ref 4.22–5.81)
RDW: 15.2 % (ref 11.5–15.5)
RDW: 15 % (ref 11.5–15.5)
WBC: 15.8 10*3/uL — ABNORMAL HIGH (ref 4.0–10.5)
WBC: 17.4 10*3/uL — ABNORMAL HIGH (ref 4.0–10.5)

## 2014-08-04 LAB — BASIC METABOLIC PANEL
ANION GAP: 6 (ref 5–15)
BUN: 19 mg/dL (ref 6–20)
CHLORIDE: 106 mmol/L (ref 101–111)
CO2: 26 mmol/L (ref 22–32)
CREATININE: 0.95 mg/dL (ref 0.61–1.24)
Calcium: 8.4 mg/dL — ABNORMAL LOW (ref 8.9–10.3)
GFR calc Af Amer: >60 mL/min (ref >60)
GFR calc non Af Amer: >60 mL/min (ref >60)
Glucose, Bld: 144 mg/dL — ABNORMAL HIGH (ref 65–99)
Potassium: 4.1 mmol/L (ref 3.5–5.1)
Sodium: 138 mmol/L (ref 135–145)

## 2014-08-04 LAB — I-STAT TROPONIN, ED: Troponin i, poc: 0.95 ng/mL (ref 0.00–0.08)

## 2014-08-04 MED ORDER — IOHEXOL 350 MG/ML SOLN
100.0000 mL | Freq: Once | INTRAVENOUS | Status: AC | PRN
  Administered 2014-08-04: 100 mL via INTRAVENOUS

## 2014-08-04 MED ORDER — FENTANYL CITRATE (PF) 100 MCG/2ML IJ SOLN
50.0000 ug | Freq: Once | INTRAMUSCULAR | Status: AC
  Administered 2014-08-04: 50 ug via INTRAVENOUS
  Filled 2014-08-04: qty 2

## 2014-08-04 MED ORDER — PIPERACILLIN-TAZOBACTAM 3.375 G IVPB
3.3750 g | Freq: Once | INTRAVENOUS | Status: AC
  Administered 2014-08-04: 3.375 g via INTRAVENOUS
  Filled 2014-08-04: qty 50

## 2014-08-04 MED ORDER — FUROSEMIDE 40 MG PO TABS: 40.0000 mg | ORAL_TABLET | Freq: Every day | ORAL | Status: DC

## 2014-08-04 MED ORDER — VANCOMYCIN HCL IN DEXTROSE 1-5 GM/200ML-% IV SOLN
1000.0000 mg | Freq: Once | INTRAVENOUS | Status: AC
  Administered 2014-08-04: 1000 mg via INTRAVENOUS
  Filled 2014-08-04: qty 200

## 2014-08-04 MED ORDER — MORPHINE SULFATE 4 MG/ML IJ SOLN
4.0000 mg | Freq: Once | INTRAMUSCULAR | Status: AC
  Administered 2014-08-04: 4 mg via INTRAVENOUS
  Filled 2014-08-04: qty 1

## 2014-08-04 MED ORDER — MORPHINE SULFATE 2 MG/ML IJ SOLN: 2.0000 mg | Freq: Once | INTRAMUSCULAR | Status: DC

## 2014-08-04 MED ORDER — IOHEXOL 300 MG/ML  SOLN
25.0000 mL | Freq: Once | INTRAMUSCULAR | Status: AC | PRN
  Administered 2014-08-04: 25 mL via ORAL

## 2014-08-04 MED ORDER — CARVEDILOL 12.5 MG PO TABS
12.5000 mg | ORAL_TABLET | Freq: Once | ORAL | Status: AC
  Administered 2014-08-05: 12.5 mg via ORAL
  Filled 2014-08-04 (×2): qty 1

## 2014-08-04 MED ORDER — HYDROMORPHONE HCL 1 MG/ML IJ SOLN
1.0000 mg | Freq: Once | INTRAMUSCULAR | Status: AC
  Administered 2014-08-04: 1 mg via INTRAVENOUS
  Filled 2014-08-04: qty 1

## 2014-08-04 MED ORDER — HEPARIN SOD (PORK) LOCK FLUSH 100 UNIT/ML IV SOLN
500.0000 [IU] | INTRAVENOUS | Status: AC | PRN
  Administered 2014-08-04: 500 [IU]

## 2014-08-04 NOTE — ED Provider Notes (Addendum)
CSN: [id number]     Arrival date & time 08/04/14  1811 History   First MD Initiated Contact with Patient 08/04/14 1844     Chief Complaint  Patient presents with  . Constipation  . unable to void      (Consider location/radiation/quality/duration/timing/severity/associated sxs/prior Treatment) HPI [name] is a 59 year old man was just discharged from the hospital today. He was admitted after cardiac arrest that occurred while he was receiving chemotherapy drugs for larynx cancer.. This occurred on July 20. He was discharged today. He reports he had no abdominal pain when he left the hospital. He states his eating normally here in the hospital. About 2 hours prior to coming in he began having some severe crampy pain from the epigastric area down into the suprapubic region. He describes the pain as severe. He has had an enema at home with a small amount of stool out. He has not had nausea, vomiting, or diarrhea. He denies any pain radiating into his back or his legs. Past Medical History  Diagnosis Date  . Carotid stenosis   . Coronary artery disease     s/p CABG 2006; stenting to vein graft 05-2013; NSTEMI 06-2013 in setting of PEA arrest  . Subclavian artery stenosis, left     s/p stenting 2006  . Hypertension   . Hyperlipidemia   . Ischemic cardiomyopathy     echo 06/2013 EF 10-15% (normal 01/2013)  . At risk for sudden cardiac death, has lifevest at discharge 06/20/2013  . Shock liver 06/20/2013  . Acute MI, troponin > 20, no obvious culprit vessel 06/20/2013  . Hypothermia, induced, post arrest 06/20/2013  . Acute encephalopathy, improved 06/20/2013  . Dizziness 06/20/2013  . Weakness due to cardiac arrest 06/20/2013  . Fever, possible aspiration-treated with antibiotics 06/20/2013  . Tobacco abuse     >100 pack year history   . Dyslipidemia 10/09/2013    F[employer] Study atorvastatin -eIRB # [id number] tablet Take 40 mg by mouth daily.   Marland Kitchen PAD (peripheral artery disease) 10/09/2013    Occluded left  common carotid and moderate right internal carotid artery stenosis. Previous stent to the left subclavian artery. Right iliac stent. Bilateral 60-99% renal artery stenosis    . Heart failure, acute on chronic, systolic and diastolic 15/94/7076  . Anxiety   . GERD (gastroesophageal reflux disease)   . 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 Angioplasty  05/2013    stent to SVG - LAD vein graft at [facility]  . Coronary artery bypass graft  09/2004    SVG to LAD (at [facility])  . Nm myocar perf wall motion  06/2008    persantine myoview - normal pattern of systolic thickening/wall motion/perfusion;  . Lower extremity arterial doppler  08/2008    right and left ABI - mild arterial insufficiency at rest; R CIA/stent with 50-69% narrowing, L CIA with increased velocities (50-69% diameter reduction)  . Renal artery doppler  05/2008    right and left prox renal arteries with narrowing and increased velocities (60-99% diameter reduction)  . Iliac artery stent  07/24/2008    8x6 Smart nitinol self-expanding stent to origin of ilaci down into external iliac crossing the hypogastric artery (Dr. Adora Fridge)  . Subclavian angiogram Left 08/20/2004    8x23mm Genesis balloon mounted stent  . Left heart catheterization with coronary/graft angiogram N/A 06/15/2013    Procedure: LEFT HEART CATHETERIZATION WITH Beatrix Fetters;  Surgeon: Belva Crome  III, MD;  Location: [place] CATH LAB;  Service: Cardiovascular;  Laterality: N/A;  . Cardiac catheterization    . Direct laryngoscopy N/A 07/06/2014    Procedure: DIRECT LARYNGOSCOPY AND ESOPAGOSCOPY WITH BIOPSY;  Surgeon: [name], MD;  Location: [facility] OR;  Service: ENT;  Laterality: N/A;   Family History  Problem Relation Age of Onset  . Heart disease Maternal Aunt   . Heart attack Mother   . Stroke Mother   . Cancer Father   . Stroke Sister   . Heart Problems Sister    History  Substance Use Topics  . Smoking  status: Former Smoker -- 4.00 packs/day for 45 years    Types: Cigarettes    Quit date: 06/09/2013  . Smokeless tobacco: Former Systems developer     Comment: tried for a few weeks  . Alcohol Use: 12.6 - 25.2 oz/week    21-42 Cans of beer per week     Comment: 3-6 12 oz cans daily     Review of Systems  All other systems reviewed and are negative.     Allergies  Cetuximab  Home Medications   Prior to Admission medications   Medication Sig Start Date End Date Taking? Authorizing Provider  acetaminophen (TYLENOL) 500 MG tablet Take 500 mg by mouth every 6 (six) hours as needed for moderate pain.     Historical Provider, MD  ALPRAZolam Duanne Moron) 0.5 MG tablet Take 0.5 mg by mouth 3 (three) times daily as needed for anxiety.     Historical Provider, MD  aspirin EC 81 MG tablet Take 81 mg by mouth daily.    Historical Provider, MD  buPROPion (WELLBUTRIN XL) 300 MG 24 hr tablet Take 300 mg by mouth daily.    Historical Provider, MD  carvedilol (COREG) 12.5 MG tablet TAKE 1 TABLET BY MOUTH TWICE DAILY. 07/10/14   Mihai Croitoru, MD  fentaNYL (DURAGESIC - DOSED MCG/HR) 25 MCG/HR patch Place 1 patch (25 mcg total) onto the skin every 3 (three) days. Patient not taking: Reported on 08/02/2014 08/01/14   [name], MD  furosemide (LASIX) 40 MG tablet Take 1 tablet (40 mg total) by mouth daily. 08/04/14   Donita Brooks, NP  KRILL OIL PO Take 1 capsule by mouth daily.    Historical Provider, MD  lidocaine-prilocaine (EMLA) cream Apply to affected area once Patient taking differently: Apply 1 application topically as directed. Apply to affected area once 08/01/14   [name], MD  morphine (ROXANOL) 20 MG/ML concentrated solution Take 0.5 mLs (10 mg total) by mouth every 2 (two) hours as needed for severe pain. Patient not taking: Reported on 08/02/2014 08/01/14   [name], MD  Multiple Vitamins-Minerals (MULTIVITAMIN GUMMIES ADULT) CHEW Chew 1 each by mouth 2 (two) times daily.    Historical Provider, MD   nitroGLYCERIN (NITROSTAT) 0.4 MG SL tablet Place 0.4 mg under the tongue every 5 (five) minutes as needed for chest pain.    Historical Provider, MD  oxyCODONE 10 MG TABS Take 1 tablet (10 mg total) by mouth every 4 (four) hours as needed for severe pain. 07/20/14   [name], MD  pantoprazole (PROTONIX) 40 MG tablet TAKE 1 TABLET BY MOUTH EVERY DAY 04/17/14   Mihai Croitoru, MD  potassium chloride SA (K-DUR,KLOR-CON) 20 MEQ tablet Take 1 tablet (20 mEq total) by mouth daily. 12/26/13   Mihai Croitoru, MD   BP 125/70 mmHg  Pulse 98  Temp(Src) 98.5 F (36.9 C) (Oral)  Resp 18  SpO2 98%  Physical Exam  Constitutional: He is oriented to person, place, and time. He appears well-developed and well-nourished. No distress.  HENT:  Head: Normocephalic and atraumatic.  Eyes: Conjunctivae and EOM are normal. Pupils are equal, round, and reactive to light.  Neck: Normal range of motion. Neck supple.  Cardiovascular: Normal rate, regular rhythm and normal heart sounds.   Pulmonary/Chest: Effort normal and breath sounds normal.  Abdominal: Soft. He exhibits distension. There is tenderness.  Musculoskeletal: Normal range of motion. He exhibits no edema or tenderness.  Neurological: He is alert and oriented to person, place, and time. No cranial nerve deficit. Coordination normal.  Skin: Skin is warm and dry. He is not diaphoretic.  Psychiatric: He has a normal mood and affect. His behavior is normal.  Nursing note and vitals reviewed.   ED Course  Procedures (including critical care time) Labs Review Labs Reviewed  CBC WITH DIFFERENTIAL/PLATELET - Abnormal; Notable for the following:    WBC 17.4 (*)    RBC 3.28 (*)    Hemoglobin 10.1 (*)    HCT 30.6 (*)    Neutrophils Relative % 86 (*)    Neutro Abs 15.0 (*)    Lymphocytes Relative 7 (*)    Monocytes Absolute 1.2 (*)    All other components within normal limits  COMPREHENSIVE METABOLIC PANEL - Abnormal; Notable for the following:     Glucose, Bld 109 (*)    Calcium 8.6 (*)    Total Protein 5.9 (*)    Albumin 3.3 (*)    Total Bilirubin 0.2 (*)    All other components within normal limits  I-STAT TROPOININ, ED - Abnormal; Notable for the following:    Troponin i, poc 0.95 (*)    All other components within normal limits  URINALYSIS, ROUTINE W REFLEX MICROSCOPIC (NOT AT [facility])    Imaging Review Dg Chest Port 1 View  08/03/2014   CLINICAL DATA:  Respiratory failure.  Endotracheal tube position.  EXAM: PORTABLE CHEST - 1 VIEW  COMPARISON:  08/02/2014.  FINDINGS: 0441 hr. Endotracheal is approximately 3 cm above the carina. Nasogastric tube projects below the diaphragm, tip not visualized. There is a right IJ central venous catheter with its tip at the lower SVC level. The heart size and mediastinal contours are stable status post CABG. There are lower lung volumes with mildly increased left lower lobe atelectasis. No pneumothorax or significant pleural effusion identified. The bones appear unchanged.  IMPRESSION: Little change in position of the support system. Mildly increased left basilar atelectasis.   Electronically Signed   By: Richardean Sale M.D.   On: 08/03/2014 07:11     EKG Interpretation   Date/Time:  Friday August 04 2014 19:11:33 EDT Ventricular Rate:  95 PR Interval:  169 QRS Duration: 109 QT Interval:  507 QTC Calculation: 637 R Axis:   39 Text Interpretation:  Sinus rhythm Repol abnrm, severe global ischemia  (LM/MVD) Prolonged QT interval Confirmed by Orlena Garmon MD, Andee Poles [phone]) on  08/04/2014 7:13:59 PM      MDM   Final diagnoses:  Abdominal pain    Reviewed CT results with Dr. Purcell Nails. Free air is noted with possible source of diverticulum. Patient continues to be high but hemodynamically stable. Surgery is consulted. Initial EKG and repeat EKG diffuse lateral T-wave inversion and he has an elevated troponin here. Given his recent cardiac arrest and discharge from critical care service today, will  consult both critical care and had general surgery. IV antibiotics are instituted. 10:10 PM Discussed  with Dr.Wilson and he will be in to see.  CRITICAL CARE Performed by: Shaune Pollack Total critical care time: 60 Critical care time was exclusive of separately billable procedures and treating other patients. Critical care was necessary to treat or prevent imminent or life-threatening deterioration. Critical care was time spent personally by me on the following activities: development of treatment plan with patient and/or surrogate as well as nursing, discussions with consultants, evaluation of patient's response to treatment, examination of patient, obtaining history from patient or surrogate, ordering and performing treatments and interventions, ordering and review of laboratory studies, ordering and review of radiographic studies, pulse oximetry and re-evaluation of patient's condition.   Pattricia Boss, MD 08/04/14 0071  Pattricia Boss, MD 08/04/14 2329

## 2014-08-04 NOTE — ED Notes (Signed)
Pt c/o "a lump' in his chest and throat area and states "it feels like reflux". Repeat EKG completed and given to MD Ray she reports it is the same as earlier. He denies chest pain. Pt was placed on 2 L for SP02 of 91. Will continue to monitor.

## 2014-08-04 NOTE — Telephone Encounter (Signed)
Called spoke with Amy (pt father). Pt is bloated and has no urinated since he left the hospital per daughter and was released today at 62. She reports Dr. Lake Bells saw pt in hospital. Wants to know if he can take a laxative? Please advise thanks

## 2014-08-04 NOTE — ED Notes (Addendum)
MD Redmond Pulling  And intensivist at bedside

## 2014-08-04 NOTE — Telephone Encounter (Signed)
I'm concerned about this.  I tried to call him but had to leave a message. If he is constipated then the answer is yes, take a laxative. If he hasn't urinated however that is more of a problem. He hasn't urinated by tonight then he really needs to go to the ER because he may need a urinary catheter.

## 2014-08-04 NOTE — Telephone Encounter (Signed)
lmomtcb x1 

## 2014-08-04 NOTE — ED Notes (Signed)
Mouth Swab provided to the patient. Family at bedside.

## 2014-08-04 NOTE — H&P (Addendum)
PULMONARY / CRITICAL CARE MEDICINE   Name: [name] MRN: [id number] DOB: December 05, 1955    ADMISSION DATE:  08/04/2014 CONSULTATION DATE:  08/04/14  REFERRING MD :  Dr. Redmond Pulling (Surgery)   CHIEF COMPLAINT:  Abdominal pain  INITIAL PRESENTATION: Pneumoperitoneum and acute cardiac ischemia  STUDIES:   1) ST seg depressions lead I,II,V4,V5,V6   HISTORY OF PRESENT ILLNESS:    59 y.o. 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 Alvy Bimler) who presented to the the [facility] 7/20 for infusion therapy of cetuximab (first infusion 7/20). He was pre-medicated with benadryl but unfortunately suffered a cardiac arrest after initiation of therapy. The patient required brief CPR with 2 rounds of epinephrine with ROSC. He was intubated by anesthesia with notes of an abnormal upper airway but able to pass ETT without difficulty.  He was successfully extubated and discharged today approx 10am.  After d/c the patient states he was feeling well.  Went home, got up and ambulated to the bathroom when he noted as severe pain in his abdomen diffusely.  He states the pain continued to worsen he has had pain with inspiration and exhalation.  In the ED he was found to have pneumoperitoneum and peritoneal signs.  He was seen by surgery and extensive discussions with family occurred.  PAST MEDICAL HISTORY :   has a past medical history of Carotid stenosis; Coronary artery disease; Subclavian artery stenosis, left; Hypertension; Hyperlipidemia; Ischemic cardiomyopathy; At risk for sudden cardiac death, has lifevest at discharge (06/20/2013); Shock liver (06/20/2013); Acute MI, troponin > 20, no obvious culprit vessel (06/20/2013);  Hypothermia, induced, post arrest (06/20/2013); Acute encephalopathy, improved (06/20/2013); Dizziness (06/20/2013); Weakness due to cardiac arrest (06/20/2013); Fever, possible aspiration-treated with antibiotics (06/20/2013); Tobacco abuse; Dyslipidemia (10/09/2013); PAD (peripheral artery disease) (10/09/2013); Heart failure, acute on chronic, systolic and diastolic (15/17/6160); Anxiety; GERD (gastroesophageal reflux disease); and Squamous cell carcinoma of larynx (07/14/2014).  has past surgical history that includes Angioplasty (05/2013); Coronary artery bypass graft (09/2004); nm myocar perf wall motion (06/2008); LOWER EXTREMITY ARTERIAL DOPPLER (08/2008); RENAL ARTERY DOPPLER (05/2008); Iliac artery stent (07/24/2008); Subclavian angiogram (Left, 08/20/2004); left heart catheterization with coronary/graft angiogram (N/A, 06/15/2013); Cardiac catheterization; and Direct laryngoscopy (N/A, 07/06/2014). Prior to Admission medications   Medication Sig Start Date End Date Taking? Authorizing Provider  ALPRAZolam Duanne Moron) 0.5 MG tablet Take 0.5 mg by mouth 3 (three) times daily as needed for anxiety.    Yes Historical Provider, MD  aspirin EC 81 MG tablet Take 81 mg by mouth daily.   Yes Historical Provider, MD  buPROPion (WELLBUTRIN XL) 300 MG 24 hr tablet Take 300 mg by mouth daily.   Yes Historical Provider, MD  carvedilol (COREG) 12.5 MG tablet TAKE 1 TABLET BY MOUTH TWICE DAILY. 07/10/14  Yes Mihai Croitoru, MD  fentaNYL (DURAGESIC - DOSED MCG/HR) 25 MCG/HR patch Place 1 patch (25 mcg total) onto the skin every 3 (three) days. 08/01/14  Yes [name], MD  furosemide (LASIX) 40 MG tablet Take 1 tablet (40 mg total) by mouth daily. 08/04/14  Yes Donita Brooks, NP  KRILL OIL PO Take 1 capsule by mouth daily.   Yes Historical Provider, MD  lidocaine-prilocaine (EMLA) cream Apply to affected area once Patient taking differently:  Apply 1 application topically as directed. Apply to affected area once 08/01/14  Yes [name], MD   Multiple Vitamins-Minerals (MULTIVITAMIN GUMMIES ADULT) CHEW Chew 1 each by mouth 2 (two) times daily.   Yes Historical Provider, MD  nitroGLYCERIN (NITROSTAT) 0.4 MG SL tablet Place 0.4 mg under the tongue every 5 (five) minutes as needed for chest pain.   Yes Historical Provider, MD  oxyCODONE 10 MG TABS Take 1 tablet (10 mg total) by mouth every 4 (four) hours as needed for severe pain. 07/20/14  Yes [name], MD  pantoprazole (PROTONIX) 40 MG tablet TAKE 1 TABLET BY MOUTH EVERY DAY 04/17/14  Yes Mihai Croitoru, MD  potassium chloride SA (K-DUR,KLOR-CON) 20 MEQ tablet Take 1 tablet (20 mEq total) by mouth daily. 12/26/13  Yes Mihai Croitoru, MD  morphine (ROXANOL) 20 MG/ML concentrated solution Take 0.5 mLs (10 mg total) by mouth every 2 (two) hours as needed for severe pain. Patient not taking: Reported on 08/02/2014 08/01/14   [name], MD   Allergies  Allergen Reactions  . Cetuximab Anaphylaxis    Cardiac arrest 08/02/2014    FAMILY HISTORY:  has no family status information on file.  SOCIAL HISTORY:  reports that he quit smoking about 13 months ago. His smoking use included Cigarettes. He has a 180 pack-year smoking history. He has quit using smokeless tobacco. He reports that he drinks about 12.6 - 25.2 oz of alcohol per week. He reports that he does not use illicit drugs.  REVIEW OF SYSTEMS:   No fevers, chills, nausea, vomiting, no respiratory distress, chest pain.  + constipation x3 days, decreased UOP.   VITAL SIGNS: Temp:  [97.8 F (36.6 C)-98.5 F (36.9 C)] 98 F (36.7 C) (07/22 2032) Pulse Rate:  [74-100] 100 (07/22 2225) Resp:  [16-24] 19 (07/22 2225) BP: (100-133)/(46-71) 125/71 mmHg (07/22 2225) SpO2:  [92 %-99 %] 92 % (07/22 2225) Weight:  [84.142 kg (185 lb 8 oz)] 84.142 kg (185 lb 8 oz) (07/22 0545) HEMODYNAMICS:   N/A   VENTILATOR SETTINGS: N/A   INTAKE / OUTPUT: NPO/oliguric  Intake/Output Summary (Last 24 hours) at 08/04/14 2342 Last data filed  at 08/04/14 2150  Gross per 24 hour  Intake      0 ml  Output    400 ml  Net   -400 ml    PHYSICAL EXAMINATION: General:  Mild acute distress Neuro:  AAOx3, CN II-XII intact.no focal deficits. HEENT:  NCAT, horse voice.  Mallampati IV.  Cardiovascular:  RRR, S1/2 no m/r/g Lungs:  CTA b/l no w/r/r Abdomen:  + Diffusely tender worst LLQ, distended, hypoactive bowel sounds, + tympany Musculoskeletal:  Grossly normal Skin:  Scattered ecchymosis. B/l mottled appearance of the flanks. Ext: no c/c/e. 2+ pulses in all distal extremities  LABS:  CBC  Recent Labs Lab 08/03/14 0600 08/04/14 0353 08/04/14 1950  WBC 14.9* 15.8* 17.4*  HGB 10.6* 8.8* 10.1*  HCT 32.1* 26.7* 30.6*  PLT 327 297 312   Coag's No results for input(s): APTT, INR in the last 168 hours. BMET  Recent Labs Lab 08/03/14 0600 08/04/14 0353 08/04/14 1950  NA 137 138 138  K 4.2 4.1 4.1  CL 107 106 103  CO2 22 26 24   BUN 18 19 19   CREATININE 1.06 0.95 1.05  GLUCOSE 175* 144* 109*   Electrolytes  Recent Labs Lab 08/01/14 1223  08/03/14 0600 08/04/14 0353 08/04/14 1950  CALCIUM 9.3  < > 8.3* 8.4* 8.6*  MG 2.3  --   --   --   --   < > =  values in this interval not displayed. Sepsis Markers  Recent Labs Lab 08/02/14 1014  LATICACIDVEN 4.64*   ABG  Recent Labs Lab 08/02/14 1020  PHART 7.304*  PCO2ART 42.1  PO2ART 465*   Liver Enzymes  Recent Labs Lab 08/01/14 1223 08/04/14 1950  AST 26 40  ALT 25 43  ALKPHOS 118 73  BILITOT 0.45 0.2*  ALBUMIN 3.5 3.3*   Cardiac Enzymes No results for input(s): TROPONINI, PROBNP in the last 168 hours. Glucose No results for input(s): GLUCAP in the last 168 hours.  Imaging Ct Angio Chest Aorta W/cm &/or Wo/cm  08/04/2014   CLINICAL DATA:  Current history of laryngeal carcinoma. Patient discharged from the hospital earlier today after a brief cardiac arrest felt to be related to use of Cetuximab, also with no urine output since 0830 hr earlier  today and no bowel movement in 2 days. She presents with abdominal pain and distention.  EXAM: CT ANGIOGRAPHY CHEST, ABDOMEN AND PELVIS  TECHNIQUE: Multidetector CT imaging through the chest, abdomen and pelvis was performed using the standard protocol before and during bolus administration of intravenous contrast. Multiplanar reconstructed images and MIPs were obtained and reviewed to evaluate the vascular anatomy.  CONTRAST:  114mL OMNIPAQUE IOHEXOL 350 MG/ML IV. Oral contrast was also administered.  COMPARISON:  PET-CT 07/27/2014.  FINDINGS: CTA CHEST FINDINGS  Unenhanced images demonstrate no evidence of mural hematoma. Stent at the origin of the left subclavian artery is again noted.  Contrast enhancement of the thoracic aorta is excellent. No evidence of thoracic aortic dissection or aneurysm. Moderate atherosclerosis. Prior CABG, and the coronary graft to a diagonal branch of the LAD does opacify. A stent is present within the coronary graft. Central pulmonary arteries are patent. 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 The left subclavian artery stent is patent. Calcified and noncalcified plaque are present in the proximal innominate artery with mild stenosis. Calcified and noncalcified plaque are present at the origin of the right vertebral artery with moderate stenosis.  Heart mildly enlarged.  No pericardial effusion.  Emphysematous changes throughout both lungs. Dependent atelectasis posteriorly in the lower lobes. Small bilateral pleural effusions. Lungs otherwise clear without confluent airspace consolidation, interstitial disease, or parenchymal nodules. Central airways patent with moderate central peribronchial thickening.  No significant mediastinal, hilar or axillary lymphadenopathy. Thyroid gland normal in appearance. Right jugular Port-A-Cath tip at the cavoatrial junction. Bone window images demonstrate osseous demineralization, mild diffuse thoracic spondylosis,  multiple Schmorl's nodes, but no evidence of osseous metastatic disease.  Review of the MIP images confirms the above findings.  CTA ABDOMEN AND PELVIS FINDINGS  No evidence of abdominal aortic aneurysm or dissection. Severe atherosclerosis involving the abdominal aorta with calcified and noncalcified plaque. High-grade stenosis at the origin of the celiac artery. Mild stenosis at the origin of the SMA. High-grade stenosis at the origin of the IMA. High-grade stenoses at the origins of the renal arteries bilaterally. Severe atherosclerosis involving the iliofemoral arteries, with high-grade stenosis suspected in the left common iliac artery and moderate stenosis in the right common iliac artery.  Pneumoperitoneum, with free air scattered throughout the abdomen and pelvis. A midline supraumbilical abdominal wall hernia is present and there are gas bubbles in the subcutaneous fat of the abdominal wall at the level of the hernia. There are acute inflammatory changes in the right upper pelvis, where there are distal ileal loops and the mid sigmoid colon. I believe the sigmoid colon is the site of the  perforation, as there is asymmetric wall thickening along its right lateral wall, and there is gas within the bowel wall. There are scattered diverticula involving the mid sigmoid colon in this region. There is no evidence of bowel obstruction. A small amount of ascites is present in the pelvis. A normal appendix is identified in the right mid abdomen.  Diffuse hepatic steatosis without focal hepatic parenchymal abnormality. Normal appearing spleen, pancreas, right adrenal gland, and kidneys. Approximate 2.3 cm nodule arising from the left adrenal gland which was not hypermetabolic on the prior PET-CT. No significant lymphadenopathy involving the abdomen or pelvis. Small hiatal hernia; stomach otherwise normal in appearance. Normal appearing urinary bladder. Prostate gland and seminal vesicles normal for age. Small left  inguinal hernia containing fat.  Bone window images demonstrate degenerative changes involving the facet joints of the lower lumbar spine, degenerative changes involving the right sacroiliac joint, but no evidence of osseous metastatic disease.  Review of the MIP images confirms the above findings.  IMPRESSION: 1. Pneumoperitoneum. The origin of the free intraperitoneal air is felt to be perforated diverticulitis involving the mid sigmoid colon. 2. No evidence of thoracic or abdominal aortic dissection. 3. Generalized atherosclerosis as detailed above. 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 4. COPD/emphysema. Small bilateral pleural effusions and mild bilateral lower lobe atelectasis. No acute cardiopulmonary disease otherwise. 5. Diffuse hepatic steatosis. 6. Left adrenal adenoma. 7. Small hiatal hernia. 8. Small left inguinal hernia containing fat. 9. Midline supraumbilical abdominal wall hernia. Critical Value/emergent results were called by telephone at the time of interpretation on 08/04/2014 at 9:29 pm to Dr. Pattricia Boss, who verbally acknowledged these results.   Electronically Signed   By: Evangeline Dakin M.D.   On: 08/04/2014 21:34   Ct Angio Abd/pel W/ And/or W/o  08/04/2014   CLINICAL DATA:  Current history of laryngeal carcinoma. Patient discharged from the hospital earlier today after a brief cardiac arrest felt to be related to use of Cetuximab, also with no urine output since 0830 hr earlier today and no bowel movement in 2 days. She presents with abdominal pain and distention.  EXAM: CT ANGIOGRAPHY CHEST, ABDOMEN AND PELVIS  TECHNIQUE: Multidetector CT imaging through the chest, abdomen and pelvis was performed using the standard protocol before and during bolus administration of intravenous contrast. Multiplanar reconstructed images and MIPs were obtained and reviewed to evaluate the vascular anatomy.  CONTRAST:  166mL OMNIPAQUE IOHEXOL 350 MG/ML IV. Oral contrast was  also administered.  COMPARISON:  PET-CT 07/27/2014.  FINDINGS: CTA CHEST FINDINGS  Unenhanced images demonstrate no evidence of mural hematoma. Stent at the origin of the left subclavian artery is again noted.  Contrast enhancement of the thoracic aorta is excellent. No evidence of thoracic aortic dissection or aneurysm. Moderate atherosclerosis. Prior CABG, and the coronary graft to a diagonal branch of the LAD does opacify. A stent is present within the coronary graft. Central pulmonary arteries are patent. 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 The left subclavian artery stent is patent. Calcified and noncalcified plaque are present in the proximal innominate artery with mild stenosis. Calcified and noncalcified plaque are present at the origin of the right vertebral artery with moderate stenosis.  Heart mildly enlarged.  No pericardial effusion.  Emphysematous changes throughout both lungs. Dependent atelectasis posteriorly in the lower lobes. Small bilateral pleural effusions. Lungs otherwise clear without confluent airspace consolidation, interstitial disease, or parenchymal nodules. Central airways patent with moderate central  peribronchial thickening.  No significant mediastinal, hilar or axillary lymphadenopathy. Thyroid gland normal in appearance. Right jugular Port-A-Cath tip at the cavoatrial junction. Bone window images demonstrate osseous demineralization, mild diffuse thoracic spondylosis, multiple Schmorl's nodes, but no evidence of osseous metastatic disease.  Review of the MIP images confirms the above findings.  CTA ABDOMEN AND PELVIS FINDINGS  No evidence of abdominal aortic aneurysm or dissection. Severe atherosclerosis involving the abdominal aorta with calcified and noncalcified plaque. High-grade stenosis at the origin of the celiac artery. Mild stenosis at the origin of the SMA. High-grade stenosis at the origin of the IMA. High-grade stenoses at the origins of  the renal arteries bilaterally. Severe atherosclerosis involving the iliofemoral arteries, with high-grade stenosis suspected in the left common iliac artery and moderate stenosis in the right common iliac artery.  Pneumoperitoneum, with free air scattered throughout the abdomen and pelvis. A midline supraumbilical abdominal wall hernia is present and there are gas bubbles in the subcutaneous fat of the abdominal wall at the level of the hernia. There are acute inflammatory changes in the right upper pelvis, where there are distal ileal loops and the mid sigmoid colon. I believe the sigmoid colon is the site of the perforation, as there is asymmetric wall thickening along its right lateral wall, and there is gas within the bowel wall. There are scattered diverticula involving the mid sigmoid colon in this region. There is no evidence of bowel obstruction. A small amount of ascites is present in the pelvis. A normal appendix is identified in the right mid abdomen.  Diffuse hepatic steatosis without focal hepatic parenchymal abnormality. Normal appearing spleen, pancreas, right adrenal gland, and kidneys. Approximate 2.3 cm nodule arising from the left adrenal gland which was not hypermetabolic on the prior PET-CT. No significant lymphadenopathy involving the abdomen or pelvis. Small hiatal hernia; stomach otherwise normal in appearance. Normal appearing urinary bladder. Prostate gland and seminal vesicles normal for age. Small left inguinal hernia containing fat.  Bone window images demonstrate degenerative changes involving the facet joints of the lower lumbar spine, degenerative changes involving the right sacroiliac joint, but no evidence of osseous metastatic disease.  Review of the MIP images confirms the above findings.  IMPRESSION: 1. Pneumoperitoneum. The origin of the free intraperitoneal air is felt to be perforated diverticulitis involving the mid sigmoid colon. 2. No evidence of thoracic or abdominal  aortic dissection. 3. Generalized atherosclerosis as detailed above. 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 4. COPD/emphysema. Small bilateral pleural effusions and mild bilateral lower lobe atelectasis. No acute cardiopulmonary disease otherwise. 5. Diffuse hepatic steatosis. 6. Left adrenal adenoma. 7. Small hiatal hernia. 8. Small left inguinal hernia containing fat. 9. Midline supraumbilical abdominal wall hernia. Critical Value/emergent results were called by telephone at the time of interpretation on 08/04/2014 at 9:29 pm to Dr. Pattricia Boss, who verbally acknowledged these results.   Electronically Signed   By: Evangeline Dakin M.D.   On: 08/04/2014 21:34     ASSESSMENT / PLAN:  PULMONARY:  A:CT with e/o emphysematous changes.  But no wheezing, satting low 90's on RA does not appear to be decompensated. P:   - Maintain O2 saturation >88% - Outpatient PFTs  CARDIOVASCULAR A: Acute ST seg depressions with elevated troponin in the absence of chest pain.  Suspect demand ischemia/Type II MI.  At this time he his a very high risk from a cardiovascular standpoint for his surgery, however, given the acuity his abdominal intervention takes  precedent.  P:  - Continue home carvedilol pre-op 12.5mg  PO BID - Cardiology consult - Anesthesia pre-op evaluation - per cards, no role for acute intervention given his abdominal acuity. - Q6hr troponins and EKG  RENAL A:  Oliguric/anuric renal failure. P:   - volume resucitation - treatment of acute abdomen - FeNa/FeUrea  GASTROINTESTINAL A:  Bowel perforation, with free air in abdomen.  At this time the family has elected to pursue conservative non-operative management for tonight and re-evaluate in the AM. P:   - NGT - NPO - Abx as below - LR 1L x1 - D5 with NS @125cc /hr x8hrs  HEMATOLOGIC A:  Leukocytosis. Likely 2/2 bowel perforation P:  - Plan as above - Vanc/zosyn  INFECTIOUS A:  Currently with bowel  perforation.  Currently does not meet sepsis criteria by new criteria (SOFA SCORE =0), though is septic by old criteria (Leukocytosis and tachycardia with abd source of infection) P:   BCx2 08/04/2014 UC 08/04/2014 Sputum N/A Abx: Vanc/Zosyn, start date7/22/2016, day 1/14?  ETOH ABUSE DISORDER A: Pt reports drinking 5-6 beers every day.  He denies ever having withdrawal symptoms or seizure activity. P:  - CIWA instituted now - d/c after 24 hours without benzo requirement  FAMILY  - Updates: today  #) Full code #) NPO #) SCD DVT ppx #) Dispo: ICU admit     Meribeth Mattes, DO., MS Pulmonary and [place] Pager: [phone]  08/04/2014, 11:42 PM   ADDEDNDUM  This section of the documentation is late and is for billing and coding purposes only.  Total critical care time: 60 min  Critical care time was exclusive of separately billable procedures and treating other patients.  Critical care was necessary to treat or prevent imminent or life-threatening deterioration.  Critical care was time spent personally by me on the following activities: development of treatment plan with patient and/or surrogate as well as nursing, discussions with consultants, evaluation of patient's response to treatment, examination of patient, obtaining history from patient or surrogate, ordering and performing treatments and interventions, ordering and review of laboratory studies, ordering and review of radiographic studies, pulse oximetry and re-evaluation of patient's condition.   Meribeth Mattes, DO., MS  Pulmonary and Critical Care Medicine

## 2014-08-04 NOTE — ED Notes (Signed)
MD Ray made aware of request for medication.

## 2014-08-04 NOTE — ED Notes (Signed)
Patient states he has not urinated since 0830 today and no BM in 2 days. Patient c/o abdominal pain and distention.

## 2014-08-04 NOTE — Telephone Encounter (Signed)
Left detailed message on voicemail advising daughter of Dr. Anastasia Pall message below Asked that she call back to confirm receipt of our message.

## 2014-08-04 NOTE — Discharge Summary (Signed)
Physician Discharge Summary  Patient ID: [name] MRN: [id number] DOB/AGE: 17-Feb-1955 59 y.o.  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  CAD s/p CABG PVD Left [place] Artery Stenosis  HLD  HTN ICM  Discharge Plan: Resume home ASA, coreg, PRN NTG Follow up with Cardiology as previously arranged  Continue lasix and potassium as previously ordered - Cardiology / Oncology discussing if he should continue.  Will defer to them regarding further decision.   Acute Respiratory Failure   Discharge Plan: Resolved, no acute follow up at time of discharge.   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   Proceed with radiation after discharge, scheduled to begin on 7/25   Acute Encephalopathy Anxiety   Cancer Associated Pain   Discharge Plan: Resume home xanax & wellbutrin at discharge  Continue fentanyl patch (has had excellent pain control with minimal side effects on 25 mcg) with PRN Oxycodone or Roxanol for breakthrough pain                   DISCHARGE SUMMARY   [name] is a 59 y.o. 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 Alvy Bimler) who  presented to the the [facility] 7/20 for infusion therapy of cetuximab (first infusion 7/20). He was pre-medicated with benadryl but unfortunately suffered a cardiac arrest after initiation of therapy.   The patient required brief CPR with 2 rounds of epinephrine with ROSC. He was intubated by anesthesia with notes of an abnormal upper airway but able to pass ETT without difficulty. The patient was transported to the ER for further evaluation. ETT verified with CXR. He was noted to have mild hypotension post arrest with SBP's in the 80's. Lab work notable for mild increase in serum creatinine to 1.26 (from 1.1), hyperglycemia (222), WBC 12.2, Hgb 10.6 and platelets 363. The patient was admitted to the ICU for further evaluation.  Oncology notified / consulted upon admit.  The patient was observed in ICU overnight.  He was empirically treated with IV solumedrol, pepcid and benadryl for medication reaction.  AM of 7/21, he was evaluated for extubation and had a cuff leak as well as meeting parameters for extubation.  Subsequently, he was extubated to 3L per nasal cannula without difficulty.  CXR evaluation showed mild basilar atelectasis but no acute infiltrate.  The patient was able to  ambulate without difficulty post extubation, tolerated PO diet and was transferred out of ICU.  He was continued on fentanyl patch for chronic pain and tolerated well.  The patient reports better pain control and less side effects with patch than oral medications.  Discussion has been ongoing between Oncology / Cardiology regarding continuing lasix / potassium.  He will continue on this post discharge and further decision will be deferred.  The patient was medically cleared 7/22 for discharge with plans as above.               SIGNIFICANT DIAGNOSTIC STUDIES / EVENTS:  7/20 Admit after cetuximab post infusion cardiac arrest, intubated.   7/21  Extubated without difficulty.  Ambulated on unit.    CONSULTS Oncology    TUBES / LINES OETT 7/20 >> 7/21   Discharge Exam: General: WDWN adult male in NAD Neuro: AAOx4, speech clear, MAE CV: s1s2 rrr, no m/r/g PULM: resp's even/non-labored, lungs bilaterally clear, raspy voice  GI: NTND, bsx4 active, tolerating PO's without difficulty  Extremities: warm/dry, multiple tattoos, no edema   Filed Vitals:   08/03/14 1555 08/03/14 1855 08/03/14 2155 08/04/14 0545  BP:  105/60 92/51 100/46  Pulse:  99 68 74  Temp: 97.7 F (36.5 C) 97.9 F (36.6 C) 97.9 F (36.6 C) 97.8 F (36.6 C)  TempSrc: Oral Oral Oral Oral  Resp:  _0 Height:      Weight:    185 lb 8 oz (84.142 kg)  SpO2:  99% 94% 94%     Discharge Labs  BMET  Recent Labs Lab 08/01/14 1223  08/02/14 0954 08/03/14 0600 08/04/14 0353  NA 137  --  137 137 138  K 4.3  < > 4.3 4.2 4.1  CL  --   --  105 107 106  CO2 28  --  _1 GLUCOSE 101  --  222* 175* 144*  BUN 14.1  --  _2 CREATININE 1.1  --  1.26* 1.06 0.95  CALCIUM 9.3  --  8.0* 8.3* 8.4*  MG 2.3  --   --   --   --   < > = values in this interval not displayed.  CBC  Recent Labs Lab 08/02/14 0954 08/03/14 0600 08/04/14 0353  HGB 11.8* 10.6* 8.8*  HCT 37.3* 32.1* 26.7*  WBC 12.2* 14.9* 15.8*  PLT 324 327 297   Anti-Coagulation  Recent Labs Lab 07/28/14 1230  INR 0.94         Follow-up Information    Follow up with Millsaps, Luane School, NP.   Why:  As needed   Contact information:   [facility] [place] [address] [phone]       Follow up with [facility], NI, MD On 08/07/2014.   Specialty:  Hematology and Oncology   Why:  Appt at 11 AM    Contact information:   [place] [address] [phone]       Schedule an appointment as soon as possible for a visit with Sanda Klein, MD.   Specialty:  Cardiology   Contact information:   [address] [place] [address] [address] [phone]          Medication  List    TAKE these medications        acetaminophen 500 MG tablet  Commonly known as:  TYLENOL  Take 500 mg by mouth every 6 (six) hours as needed  for moderate pain.     ALPRAZolam 0.5 MG tablet  Commonly known as:  XANAX  Take 0.5 mg by mouth 3 (three) times daily as needed for anxiety.     aspirin EC 81 MG tablet  Take 81 mg by mouth daily.     buPROPion 300 MG 24 hr tablet  Commonly known as:  WELLBUTRIN XL  Take 300 mg by mouth daily.     carvedilol 12.5 MG tablet  Commonly known as:  COREG  TAKE 1 TABLET BY MOUTH TWICE DAILY.     fentaNYL 25 MCG/HR patch  Commonly known as:  DURAGESIC - dosed mcg/hr  Place 1 patch (25 mcg total) onto the skin every 3 (three) days.     furosemide 40 MG tablet  Commonly known as:  LASIX  Take 1 tablet (40 mg total) by mouth daily.     KRILL OIL PO  Take 1 capsule by mouth daily.     lidocaine-prilocaine cream  Commonly known as:  EMLA  Apply to affected area once     morphine 20 MG/ML concentrated solution  Commonly known as:  ROXANOL  Take 0.5 mLs (10 mg total) by mouth every 2 (two) hours as needed for severe pain.     MULTIVITAMIN GUMMIES ADULT Chew  Chew 1 each by mouth 2 (two) times daily.     nitroGLYCERIN 0.4 MG SL tablet  Commonly known as:  NITROSTAT  Place 0.4 mg under the tongue every 5 (five) minutes as needed for chest pain.     Oxycodone HCl 10 MG Tabs  Take 1 tablet (10 mg total) by mouth every 4 (four) hours as needed for severe pain.     pantoprazole 40 MG tablet  Commonly known as:  PROTONIX  TAKE 1 TABLET BY MOUTH EVERY DAY     potassium chloride SA 20 MEQ tablet  Commonly known as:  K-DUR,KLOR-CON  Take 1 tablet (20 mEq total) by mouth daily.         Disposition: Home.  No new home health needs identified at time of discharge.   Discharged Condition: HAKEEN SHIPES has met maximum benefit of inpatient care and is medically stable and cleared for discharge.  Patient is pending follow up as  above.      Time spent on disposition:  Greater than 35 minutes.   Signed: Noe Gens, NP-C [facility] Pgr: [phone] Office: [phone]

## 2014-08-04 NOTE — ED Notes (Signed)
Pt returned from CT °

## 2014-08-04 NOTE — ED Notes (Signed)
Pt requesting pain medication will notify MD

## 2014-08-04 NOTE — Consult Note (Addendum)
Reason for Consult:Free air Referring Physician: Dr Mallie Mussel is an 59 y.o. male.  HPI: 59 yo WM with significant CAD, PAD, ICM s/p CABG 2006, PTCA 05/2013, NSTEMI 06/2013 in setting of PEA arrest with recent dx of T3N0 laryngeal cancer who had episode of cardiac arrest just 2 days ago at cancer after starting IV chemo infusion presents with acute onset and worsening abd pain. Pt was just discharged from ICU service this am from [facility]. Reports no BM since Wed. Got home and thereafter developed generalized abd pain, mostly on LLQ, some nausea, subjective f/c. Little uop today. Came to ED where CTA chest/abd done which showed free intraperitoneal air likely diverticular perf in sigmoid. Pt also found to have new EKG changes with positive troponin. Pt had not had repeat cardiac evaluation after his arrest 2 days ago. Pt also received pulse steroids while in hospital. Denies melena/hematochezia.  Denies recent TIA/ amarusosis fugax, PND, orthopnea, DOE. Former very heavy smoker (>100 pack yr)  Past Medical History  Diagnosis Date  . Carotid stenosis   . Coronary artery disease     s/p CABG 2006; stenting to vein graft 05-2013; NSTEMI 06-2013 in setting of PEA arrest  . Subclavian artery stenosis, left     s/p stenting 2006  . Hypertension   . Hyperlipidemia   . Ischemic cardiomyopathy     echo 06/2013 EF 10-15% (normal 01/2013)  . At risk for sudden cardiac death, has lifevest at discharge 06/20/2013  . Shock liver 06/20/2013  . Acute MI, troponin > 20, no obvious culprit vessel 06/20/2013  . Hypothermia, induced, post arrest 06/20/2013  . Acute encephalopathy, improved 06/20/2013  . Dizziness 06/20/2013  . Weakness due to cardiac arrest 06/20/2013  . Fever, possible aspiration-treated with antibiotics 06/20/2013  . Tobacco abuse     >100 pack year history   . Dyslipidemia 10/09/2013    [place] Study atorvastatin -eIRB # [id number] tablet Take 40 mg by mouth daily.   Marland Kitchen PAD (peripheral artery disease) 10/09/2013      Occluded left common carotid and moderate right internal carotid artery stenosis. Previous stent to the left subclavian artery. Right iliac stent. Bilateral 60-99% renal artery stenosis    . Heart failure, acute on chronic, systolic and diastolic 09/60/4540  . Anxiety   . GERD (gastroesophageal reflux disease)   . 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 Angioplasty  05/2013    stent to SVG - LAD vein graft at [facility]  . Coronary artery bypass graft  09/2004    SVG to LAD (at [facility])  . Nm myocar perf wall motion  06/2008    persantine myoview - normal pattern of systolic thickening/wall motion/perfusion;  . Lower extremity arterial doppler  08/2008    right and left ABI - mild arterial insufficiency at rest; R CIA/stent with 50-69% narrowing, L CIA with increased velocities (50-69% diameter reduction)  . Renal artery doppler  05/2008    right and left prox renal arteries with narrowing and increased velocities (60-99% diameter reduction)  . Iliac artery stent  07/24/2008    8x6 Smart nitinol self-expanding stent to origin of ilaci down into external iliac crossing the hypogastric artery (Dr. Adora Fridge)  . Subclavian angiogram Left 08/20/2004    8x110m Genesis balloon mounted stent  . Left heart catheterization with coronary/graft angiogram N/A 06/15/2013    Procedure: LEFT HEART CATHETERIZATION WITH CBeatrix Fetters  Surgeon: HLynnell Dike  Blenda Bridegroom, MD;  Location: [facility] CATH LAB;  Service: Cardiovascular;  Laterality: N/A;  . Cardiac catheterization    . Direct laryngoscopy N/A 07/06/2014    Procedure: DIRECT LARYNGOSCOPY AND ESOPAGOSCOPY WITH BIOPSY;  Surgeon: [name], MD;  Location: [facility] OR;  Service: ENT;  Laterality: N/A;    Family History  Problem Relation Age of Onset  . Heart disease Maternal Aunt   . Heart attack Mother   . Stroke Mother   . Cancer Father   . Stroke Sister   . Heart Problems Sister     Social History:   reports that he quit smoking about 13 months ago. His smoking use included Cigarettes. He has a 180 pack-year smoking history. He has quit using smokeless tobacco. He reports that he drinks about 12.6 - 25.2 oz of alcohol per week. He reports that he does not use illicit drugs.  Allergies:  Allergies  Allergen Reactions  . Cetuximab Anaphylaxis    Cardiac arrest 08/02/2014    Medications: I have reviewed the patient's current medications.  Results for orders placed or performed during the hospital encounter of 08/04/14 (from the past 48 hour(s))  CBC with Differential/Platelet     Status: Abnormal   Collection Time: 08/04/14  7:50 PM  Result Value Ref Range   WBC 17.4 (H) 4.0 - 10.5 K/uL   RBC 3.28 (L) 4.22 - 5.81 MIL/uL   Hemoglobin 10.1 (L) 13.0 - 17.0 g/dL   HCT 30.6 (L) 39.0 - 52.0 %   MCV 93.3 78.0 - 100.0 fL   MCH 30.8 26.0 - 34.0 pg   MCHC 33.0 30.0 - 36.0 g/dL   RDW 15.0 11.5 - 15.5 %   Platelets 312 150 - 400 K/uL   Neutrophils Relative % 86 (H) 43 - 77 %   Neutro Abs 15.0 (H) 1.7 - 7.7 K/uL   Lymphocytes Relative 7 (L) 12 - 46 %   Lymphs Abs 1.2 0.7 - 4.0 K/uL   Monocytes Relative 7 3 - 12 %   Monocytes Absolute 1.2 (H) 0.1 - 1.0 K/uL   Eosinophils Relative 0 0 - 5 %   Eosinophils Absolute 0.0 0.0 - 0.7 K/uL   Basophils Relative 0 0 - 1 %   Basophils Absolute 0.0 0.0 - 0.1 K/uL  Comprehensive metabolic panel     Status: Abnormal   Collection Time: 08/04/14  7:50 PM  Result Value Ref Range   Sodium 138 135 - 145 mmol/L   Potassium 4.1 3.5 - 5.1 mmol/L   Chloride 103 101 - 111 mmol/L   CO2 24 22 - 32 mmol/L   Glucose, Bld 109 (H) 65 - 99 mg/dL   BUN 19 6 - 20 mg/dL   Creatinine, Ser 1.05 0.61 - 1.24 mg/dL   Calcium 8.6 (L) 8.9 - 10.3 mg/dL   Total Protein 5.9 (L) 6.5 - 8.1 g/dL   Albumin 3.3 (L) 3.5 - 5.0 g/dL   AST 40 15 - 41 U/L   ALT 43 17 - 63 U/L   Alkaline Phosphatase 73 38 - 126 U/L   Total Bilirubin 0.2 (L) 0.3 - 1.2 mg/dL   GFR calc non Af Amer >60  >60 mL/min   GFR calc Af Amer >60 >60 mL/min    Comment: (NOTE) The eGFR has been calculated using the CKD EPI equation. This calculation has not been validated in all clinical situations. eGFR's persistently <60 mL/min signify possible Chronic Kidney Disease.    Anion gap 11 5 -  15  I-stat troponin, ED     Status: Abnormal   Collection Time: 08/04/14  7:58 PM  Result Value Ref Range   Troponin i, poc 0.95 (HH) 0.00 - 0.08 ng/mL   Comment NOTIFIED PHYSICIAN    Comment 3            Comment: Due to the release kinetics of cTnI, a negative result within the first hours of the onset of symptoms does not rule out myocardial infarction with certainty. If myocardial infarction is still suspected, repeat the test at appropriate intervals.     Dg Chest Port 1 View  08/03/2014   CLINICAL DATA:  Respiratory failure.  Endotracheal tube position.  EXAM: PORTABLE CHEST - 1 VIEW  COMPARISON:  08/02/2014.  FINDINGS: 0441 hr. Endotracheal is approximately 3 cm above the carina. Nasogastric tube projects below the diaphragm, tip not visualized. There is a right IJ central venous catheter with its tip at the lower SVC level. The heart size and mediastinal contours are stable status post CABG. There are lower lung volumes with mildly increased left lower lobe atelectasis. No pneumothorax or significant pleural effusion identified. The bones appear unchanged.  IMPRESSION: Little change in position of the support system. Mildly increased left basilar atelectasis.   Electronically Signed   By: Richardean Sale M.D.   On: 08/03/2014 07:11   Ct Angio Chest Aorta W/cm &/or Wo/cm  08/04/2014   CLINICAL DATA:  Current history of laryngeal carcinoma. Patient discharged from the hospital earlier today after a brief cardiac arrest felt to be related to use of Cetuximab, also with no urine output since 0830 hr earlier today and no bowel movement in 2 days. She presents with abdominal pain and distention.  EXAM: CT  ANGIOGRAPHY CHEST, ABDOMEN AND PELVIS  TECHNIQUE: Multidetector CT imaging through the chest, abdomen and pelvis was performed using the standard protocol before and during bolus administration of intravenous contrast. Multiplanar reconstructed images and MIPs were obtained and reviewed to evaluate the vascular anatomy.  CONTRAST:  1100m OMNIPAQUE IOHEXOL 350 MG/ML IV. Oral contrast was also administered.  COMPARISON:  PET-CT 07/27/2014.  FINDINGS: CTA CHEST FINDINGS  Unenhanced images demonstrate no evidence of mural hematoma. Stent at the origin of the left subclavian artery is again noted.  Contrast enhancement of the thoracic aorta is excellent. No evidence of thoracic aortic dissection or aneurysm. Moderate atherosclerosis. Prior CABG, and the coronary graft to a diagonal branch of the LAD does opacify. A stent is present within the coronary graft. Central pulmonary arteries are patent. 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 The left subclavian artery stent is patent. Calcified and noncalcified plaque are present in the proximal innominate artery with mild stenosis. Calcified and noncalcified plaque are present at the origin of the right vertebral artery with moderate stenosis.  Heart mildly enlarged.  No pericardial effusion.  Emphysematous changes throughout both lungs. Dependent atelectasis posteriorly in the lower lobes. Small bilateral pleural effusions. Lungs otherwise clear without confluent airspace consolidation, interstitial disease, or parenchymal nodules. Central airways patent with moderate central peribronchial thickening.  No significant mediastinal, hilar or axillary lymphadenopathy. Thyroid gland normal in appearance. Right jugular Port-A-Cath tip at the cavoatrial junction. Bone window images demonstrate osseous demineralization, mild diffuse thoracic spondylosis, multiple Schmorl's nodes, but no evidence of osseous metastatic disease.  Review of the MIP images  confirms the above findings.  CTA ABDOMEN AND PELVIS FINDINGS  No evidence of abdominal aortic aneurysm or dissection. Severe atherosclerosis  involving the abdominal aorta with calcified and noncalcified plaque. High-grade stenosis at the origin of the celiac artery. Mild stenosis at the origin of the SMA. High-grade stenosis at the origin of the IMA. High-grade stenoses at the origins of the renal arteries bilaterally. Severe atherosclerosis involving the iliofemoral arteries, with high-grade stenosis suspected in the left common iliac artery and moderate stenosis in the right common iliac artery.  Pneumoperitoneum, with free air scattered throughout the abdomen and pelvis. A midline supraumbilical abdominal wall hernia is present and there are gas bubbles in the subcutaneous fat of the abdominal wall at the level of the hernia. There are acute inflammatory changes in the right upper pelvis, where there are distal ileal loops and the mid sigmoid colon. I believe the sigmoid colon is the site of the perforation, as there is asymmetric wall thickening along its right lateral wall, and there is gas within the bowel wall. There are scattered diverticula involving the mid sigmoid colon in this region. There is no evidence of bowel obstruction. A small amount of ascites is present in the pelvis. A normal appendix is identified in the right mid abdomen.  Diffuse hepatic steatosis without focal hepatic parenchymal abnormality. Normal appearing spleen, pancreas, right adrenal gland, and kidneys. Approximate 2.3 cm nodule arising from the left adrenal gland which was not hypermetabolic on the prior PET-CT. No significant lymphadenopathy involving the abdomen or pelvis. Small hiatal hernia; stomach otherwise normal in appearance. Normal appearing urinary bladder. Prostate gland and seminal vesicles normal for age. Small left inguinal hernia containing fat.  Bone window images demonstrate degenerative changes involving the  facet joints of the lower lumbar spine, degenerative changes involving the right sacroiliac joint, but no evidence of osseous metastatic disease.  Review of the MIP images confirms the above findings.  IMPRESSION: 1. Pneumoperitoneum. The origin of the free intraperitoneal air is felt to be perforated diverticulitis involving the mid sigmoid colon. 2. No evidence of thoracic or abdominal aortic dissection. 3. Generalized atherosclerosis as detailed above. 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 4. COPD/emphysema. Small bilateral pleural effusions and mild bilateral lower lobe atelectasis. No acute cardiopulmonary disease otherwise. 5. Diffuse hepatic steatosis. 6. Left adrenal adenoma. 7. Small hiatal hernia. 8. Small left inguinal hernia containing fat. 9. Midline supraumbilical abdominal wall hernia. Critical Value/emergent results were called by telephone at the time of interpretation on 08/04/2014 at 9:29 pm to Dr. Pattricia Boss, who verbally acknowledged these results.   Electronically Signed   By: Evangeline Dakin M.D.   On: 08/04/2014 21:34   Ct Angio Abd/pel W/ And/or W/o  08/04/2014   CLINICAL DATA:  Current history of laryngeal carcinoma. Patient discharged from the hospital earlier today after a brief cardiac arrest felt to be related to use of Cetuximab, also with no urine output since 0830 hr earlier today and no bowel movement in 2 days. She presents with abdominal pain and distention.  EXAM: CT ANGIOGRAPHY CHEST, ABDOMEN AND PELVIS  TECHNIQUE: Multidetector CT imaging through the chest, abdomen and pelvis was performed using the standard protocol before and during bolus administration of intravenous contrast. Multiplanar reconstructed images and MIPs were obtained and reviewed to evaluate the vascular anatomy.  CONTRAST:  131m OMNIPAQUE IOHEXOL 350 MG/ML IV. Oral contrast was also administered.  COMPARISON:  PET-CT 07/27/2014.  FINDINGS: CTA CHEST FINDINGS  Unenhanced  images demonstrate no evidence of mural hematoma. Stent at the origin of the left subclavian artery is again noted.  Contrast  enhancement of the thoracic aorta is excellent. No evidence of thoracic aortic dissection or aneurysm. Moderate atherosclerosis. Prior CABG, and the coronary graft to a diagonal branch of the LAD does opacify. A stent is present within the coronary graft. Central pulmonary arteries are patent. 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 The left subclavian artery stent is patent. Calcified and noncalcified plaque are present in the proximal innominate artery with mild stenosis. Calcified and noncalcified plaque are present at the origin of the right vertebral artery with moderate stenosis.  Heart mildly enlarged.  No pericardial effusion.  Emphysematous changes throughout both lungs. Dependent atelectasis posteriorly in the lower lobes. Small bilateral pleural effusions. Lungs otherwise clear without confluent airspace consolidation, interstitial disease, or parenchymal nodules. Central airways patent with moderate central peribronchial thickening.  No significant mediastinal, hilar or axillary lymphadenopathy. Thyroid gland normal in appearance. Right jugular Port-A-Cath tip at the cavoatrial junction. Bone window images demonstrate osseous demineralization, mild diffuse thoracic spondylosis, multiple Schmorl's nodes, but no evidence of osseous metastatic disease.  Review of the MIP images confirms the above findings.  CTA ABDOMEN AND PELVIS FINDINGS  No evidence of abdominal aortic aneurysm or dissection. Severe atherosclerosis involving the abdominal aorta with calcified and noncalcified plaque. High-grade stenosis at the origin of the celiac artery. Mild stenosis at the origin of the SMA. High-grade stenosis at the origin of the IMA. High-grade stenoses at the origins of the renal arteries bilaterally. Severe atherosclerosis involving the iliofemoral arteries, with  high-grade stenosis suspected in the left common iliac artery and moderate stenosis in the right common iliac artery.  Pneumoperitoneum, with free air scattered throughout the abdomen and pelvis. A midline supraumbilical abdominal wall hernia is present and there are gas bubbles in the subcutaneous fat of the abdominal wall at the level of the hernia. There are acute inflammatory changes in the right upper pelvis, where there are distal ileal loops and the mid sigmoid colon. I believe the sigmoid colon is the site of the perforation, as there is asymmetric wall thickening along its right lateral wall, and there is gas within the bowel wall. There are scattered diverticula involving the mid sigmoid colon in this region. There is no evidence of bowel obstruction. A small amount of ascites is present in the pelvis. A normal appendix is identified in the right mid abdomen.  Diffuse hepatic steatosis without focal hepatic parenchymal abnormality. Normal appearing spleen, pancreas, right adrenal gland, and kidneys. Approximate 2.3 cm nodule arising from the left adrenal gland which was not hypermetabolic on the prior PET-CT. No significant lymphadenopathy involving the abdomen or pelvis. Small hiatal hernia; stomach otherwise normal in appearance. Normal appearing urinary bladder. Prostate gland and seminal vesicles normal for age. Small left inguinal hernia containing fat.  Bone window images demonstrate degenerative changes involving the facet joints of the lower lumbar spine, degenerative changes involving the right sacroiliac joint, but no evidence of osseous metastatic disease.  Review of the MIP images confirms the above findings.  IMPRESSION: 1. Pneumoperitoneum. The origin of the free intraperitoneal air is felt to be perforated diverticulitis involving the mid sigmoid colon. 2. No evidence of thoracic or abdominal aortic dissection. 3. Generalized atherosclerosis as detailed above. Of note, the left common  carotid artery is occluded at its origin. 4. COPD/emphysema. Small bilateral pleural effusions and mild bilateral lower lobe atelectasis. No acute cardiopulmonary disease otherwise. 5. Diffuse hepatic steatosis. 6. Left adrenal adenoma. 7. Small hiatal hernia. 8. Small left inguinal hernia  containing fat. 9. Midline supraumbilical abdominal wall hernia. Critical Value/emergent results were called by telephone at the time of interpretation on 08/04/2014 at 9:29 pm to Dr. Pattricia Boss, who verbally acknowledged these results.   Electronically Signed   By: Evangeline Dakin M.D.   On: 08/04/2014 21:34    Review of Systems  Constitutional: Positive for fever and chills.  HENT: Positive for sore throat.   Eyes: Negative for blurred vision and double vision.  Cardiovascular: Negative for palpitations, orthopnea, leg swelling and PND.  Gastrointestinal: Positive for nausea, abdominal pain and constipation. Negative for vomiting.  Genitourinary: Negative for dysuria, urgency and hematuria.       Little uop today  Neurological: Negative for dizziness, focal weakness, seizures and loss of consciousness.  Psychiatric/Behavioral: Negative for memory loss and substance abuse.   Blood pressure 125/71, pulse 100, temperature 98 F (36.7 C), temperature source Oral, resp. rate 19, SpO2 92 %. Physical Exam  Constitutional: He appears well-developed and well-nourished.  Obese, not toxic  HENT:  Head: Normocephalic and atraumatic.  Right Ear: External ear normal.  Left Ear: External ear normal.  Hoarse voice  Eyes: Conjunctivae are normal. No scleral icterus.  Neck: Normal range of motion. No tracheal deviation present.  Cardiovascular: Normal rate and intact distal pulses.   Respiratory: Effort normal. No respiratory distress.    GI: Soft. There is tenderness in the suprapubic area and left lower quadrant. There is guarding. A hernia is present. Hernia confirmed positive in the ventral area.    Obese;  significant LLQ/suprapubic/periumbilical TTP; +guarding, no RT;   Skin: Skin is warm, dry and intact. He is not diaphoretic.  Psychiatric: His speech is normal and behavior is normal. Judgment and thought content normal. His mood appears anxious. Cognition and memory are normal.    Assessment/Plan: Pneumoperitoneum/Perforated viscous Acute cardiac ischemia CAD s/p cabg, stents Ischemic cardiomyopathy Carotid stenosis PAD Laryngeal cancer T3N0 Mild Protein calorie malnutrition Anemia of chronic disease HTN oligouria  Pt has perforated viscous, appears to be sigmoid colon. Pt has extensive complex medical hx, active cancer, cardiac arrest 2 days ago.   Had prolonged discussion with pt, daughter and several family members regarding dx and management options.   He is very high risk for intra-op/postop complications including but not limited to intestinal ischemia, renal failure, respiratory failure, cardiac (MI, afib, heart failure), hernia, fascial dehiscence, ileus, ostomy complications, need for addl procedures, clots, pna, UTI, protracted hospital course, death.  Explained that he would remain intubated immediately postop.   Discussed option of bowel rest and iv abx. Discussed that in my opinion that that option was marginal given the fact he has underlying malignancy and borderline peritonitis. Also discussed complete non-op mgmt alone but it seemed family/pt not ready to accept it.   I also provided family with NSQIP risk calculator handout based on pt's history - 46% serious complication, 96% any complication, death 29%, discharge to nursing home/rehab 80%; etc Discussed with PCCM fellow - Dr Diona Fanti, at bedside with me. Discussed with family together PCCM spoke with cardiologist -Dr Tommi Rumps Also spoke with oncall anesthesia. - pt does have difficult airway given his ca  Pt states can i have the surgery in 3 days. Have a lot of papers to get together. Want to see addl family  members. Explained that not realistic. Re-explained why  Pt wants to wait til am to have surgery so that addl family members can come in and he can see them as well tell them  where his papers are.   I did advise him that if he wants surgery but wants to delay to the am that by delaying his infection could worsen bt now and then and those risks we talked about could increase.   Gave pt and family over 1 hour to discuss.  Pt has decided to wait til am Advised pt and family surgery would be around 0800 Also discussed G tube placement with pt (no recent trouble swallowing/eat) but if pt does recover and ultimately gets to XRT this may affect his PO ability and with recent major exp lap, percutaneous G tube would be risky.   Total time 1.5 hr reviewing chart, examine, counseling, coordinating care  Leighton Ruff. Redmond Pulling, MD, FACS General, Bariatric, & Minimally Invasive Surgery [facility], [place]        [facility] M 08/04/2014, 11:46 PM

## 2014-08-04 NOTE — Consult Note (Signed)
CARDIOLOGY CONSULT NOTE   Patient ID: [name] MRN: [id number], DOB/AGE: Oct 29, 1955   Admit date: 08/04/2014 Date of Consult: 08/05/2014   Primary Physician: Imelda Pillow, NP Primary Cardiologist: Croitoru  Pt. 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   Problem List  Past Medical History  Diagnosis Date  . Carotid stenosis   . Coronary artery disease     s/p CABG 2006; stenting to vein graft 05-2013; NSTEMI 06-2013 in setting of PEA arrest  . Subclavian artery stenosis, left     s/p stenting 2006  . Hypertension   . Hyperlipidemia   . Ischemic cardiomyopathy     echo 06/2013 EF 10-15% (normal 01/2013)  . At risk for sudden cardiac death, has lifevest at discharge 06/20/2013  . Shock liver 06/20/2013  . Acute MI, troponin > 20, no obvious culprit vessel 06/20/2013  . Hypothermia, induced, post arrest 06/20/2013  . Acute encephalopathy, improved 06/20/2013  . Dizziness 06/20/2013  . Weakness due to cardiac arrest 06/20/2013  . Fever, possible aspiration-treated with antibiotics 06/20/2013  . Tobacco abuse     >100 pack year history   . Dyslipidemia 10/09/2013    [place] Study atorvastatin -eIRB # [id number] tablet Take 40 mg by mouth daily.   Marland Kitchen PAD (peripheral artery disease) 10/09/2013    Occluded left common carotid and moderate right internal carotid artery stenosis. Previous stent to the left subclavian artery. Right iliac stent. Bilateral 60-99% renal artery stenosis    . Heart failure, acute on chronic, systolic and diastolic 97/35/3299  . Anxiety   . GERD (gastroesophageal reflux disease)   . Squamous cell carcinoma  of larynx 07/14/2014    Past Surgical History  Procedure Laterality Date  . Angioplasty  05/2013    stent to SVG - LAD vein graft at [facility]  . Coronary artery bypass graft  09/2004    SVG to LAD (at [facility])  . Nm myocar perf wall motion  06/2008    persantine myoview - normal pattern of systolic thickening/wall motion/perfusion;  . Lower extremity arterial doppler  08/2008    right and left ABI - mild arterial insufficiency at rest; R CIA/stent with 50-69% narrowing, L CIA with increased velocities (50-69% diameter reduction)  . Renal artery doppler  05/2008    right and left prox renal arteries with narrowing and increased velocities (60-99% diameter reduction)  . Iliac artery stent  07/24/2008    8x6 Smart nitinol self-expanding stent to origin of ilaci down into external iliac crossing the hypogastric artery (Dr. Adora Fridge)  . Subclavian angiogram Left 08/20/2004    8x9mm Genesis balloon mounted stent  . Left heart catheterization with coronary/graft angiogram N/A 06/15/2013    Procedure: LEFT HEART CATHETERIZATION WITH Beatrix Fetters;  Surgeon: Sinclair Grooms, MD;  Location: [facility] CATH LAB;  Service: Cardiovascular;  Laterality: N/A;  . Cardiac catheterization    . Direct laryngoscopy N/A 07/06/2014    Procedure: DIRECT LARYNGOSCOPY AND ESOPAGOSCOPY WITH BIOPSY;  Surgeon: [name], MD;  Location: [place];  Service: ENT;  Laterality: N/A;     Allergies  Allergies  Allergen Reactions  . 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 No chest pain. Initial work-up revealed POC TnI 0.95, WBC 17.4 (86% PMNs), and ECG demonstrated NSR, STD and TWI in II, aVF, V4-V6. TWI V2. QT prolongation. Compared to ECG on 08/02/14 the STD and TWI are new. Cardiology was consulted regarding pre-operative evaluation.    The patient was seen by Dr. Greer Pickerel at the [facility] ED. After the patient was informed of a high risk of morbidity and mortality with the operation he and the family decided to forgo an overnight operation so the patient can see family. They are aware that waiting may lead to worsening infection and make operation higher risk.   Brillanta was stopped about a week ago.    Relevant cardiac history notable for:  - CAD S/P CABG in '06 (SVG-LAD).  - January 2015 acute MI, proximal circumflex artery drug-eluting 2.5x16 mm Xience stent at [facility] - May 2015 he went to his PCP for shoulder pain and his Troponin was elevated. He had stenosis in the SVG-LAD and a DES placed on 06/02/13 at [facility]. He was discharged on Plavix.  - 06/09/13 he was found unresponsive at home by his son. CPR was started and EMS called. He was noted to be in VF. He was transported to [facility]. He had shock, respiratory failure, shock liver, and acute renal insufficiency. Cath revealed widely patent saphenous vein graft to the mid LAD (stented with no evidence of thrombus or restenosis). Widely patent proximal to mid circumflex stent, with haziness in the circumflex proximal to the stent but no significant obstruction. Mild to moderate distal left main stenosis (30-40%) with 80-90% ostial LAD, threatening a small to moderate diagonal territory in the distribution of the wall motion abnormality. The mid LAD off the retrograde segment from the graft insertion site was occluded. Widely patent RCA. A Myoview showed no ischemia. Echo showed left ventricular ejection fraction of 10-15%. His P2Y12 was elevated and he was taken off Plavix and put on Brilinita.  Discharged 06/20/13 with a Life Vest. If was felt that cardiac arrest was likely due transient thrombosis with reperfusion of this freshly placed stent.  - 09/27/13 ECHO showed remarkable improvement with LVEF of 45-50%. Wall motion abnormalities were seen in the proximal LAD artery territory as well as the apical septum. - Since arrest was attributed to a reversible cause and EF was improved, no ICD was implanted.  He also has extensive peripheral arterial disease. Occluded left common carotid and moderate right internal carotid artery stenosis. Previous stent the left subclavian artery. Right iliac stent. Bilateral 60-99% renal artery stenosis  Inpatient Medications  . carvedilol  12.5 mg Oral Once  .  morphine injection  2 mg Intravenous Once    Family History Family History  Problem Relation Age of Onset  . Heart disease Maternal Aunt   . Heart attack Mother   . Stroke Mother   . Cancer Father   . Stroke Sister   . Heart Problems Sister      Social  History History   Social History  . Marital Status: Married    Spouse Name: N/A  . Number of Children: 50  . Years of Education: 10   Occupational History  . carpenter    Social History Main Topics  . Smoking status: Former Smoker -- 4.00 packs/day for 45 years    Types: Cigarettes    Quit date: 06/09/2013  . Smokeless tobacco: Former Systems developer     Comment: tried for a few weeks  . Alcohol Use: 12.6 - 25.2 oz/week    21-42 Cans of beer per week     Comment: 3-6 12 oz cans daily   . Drug Use: No  . Sexual Activity: Not on file   Other Topics Concern  . Not on file   Social History Narrative     Review of Systems  General:  No chills, fever, night sweats or weight changes.  Cardiovascular:  No chest pain, dyspnea on exertion, edema, orthopnea, palpitations, paroxysmal nocturnal dyspnea. Dermatological: No rash, lesions/masses Respiratory: No cough, dyspnea Urologic: No hematuria, dysuria Abdominal:   + abdominal pain,  bloating Neurologic:  No visual changes, wkns, changes in mental status. All other systems reviewed and are otherwise negative except as noted above.  Physical Exam  Blood pressure 125/71, pulse 100, temperature 98 F (36.7 C), temperature source Oral, resp. rate 19, SpO2 92 %.  General: Pleasant, in mild discomfort Psych: Normal affect. Neuro: Alert and oriented X 3. Moves all extremities spontaneously. HEENT: Normal  Neck: Supple without bruits or JVD. Lungs:  Resp regular and unlabored, CTA. Heart: RRR no s3, s4, or murmurs. Abdomen: Very distended and diffusely TTP. Rare bowel sounds. No rebound.  Extremities: No clubbing, cyanosis or edema. DP/PT/Radials 2+ and equal bilaterally.  Labs  No results for input(s): CKTOTAL, CKMB, TROPONINI in the last 72 hours. Lab Results  Component Value Date   WBC 17.4* 08/04/2014   HGB 10.1* 08/04/2014   HCT 30.6* 08/04/2014   MCV 93.3 08/04/2014   PLT 312 08/04/2014    Recent Labs Lab 08/04/14 1950  NA 138  K 4.1  CL 103  CO2 24  BUN 19  CREATININE 1.05  CALCIUM 8.6*  PROT 5.9*  BILITOT 0.2*  ALKPHOS 73  ALT 43  AST 40  GLUCOSE 109*   No results found for: CHOL, HDL, LDLCALC, TRIG No results found for: DDIMER  Radiology/Studies  Nm Pet Image Initial (pi) Skull Base To Thigh  07/27/2014   CLINICAL DATA:  Initial treatment strategy for laryngeal carcinoma.  EXAM: NUCLEAR MEDICINE PET SKULL BASE TO THIGH  TECHNIQUE: 8.6 mCi F-18 FDG was injected intravenously. Full-ring PET imaging was performed from the skull base to thigh after the radiotracer. CT data was obtained and used for attenuation correction and anatomic localization.  FASTING BLOOD GLUCOSE:  Value: 111 mg/dl  COMPARISON:  Neck CT 06/22/2014  FINDINGS: NECK  There is a long segment of hypermetabolic activity centered in the left larynx with intense metabolic activity (SUV max equal 15.1. Activity just crosses midline anteriorly.  There is a single mildly  hypermetabolic left level 2 lymph node which is small measuring 7 mm short axis on image 30, series 4 with SUV max of 2.0. No clear additional hypermetabolic cervical lymph nodes are present.  CHEST  No hypermetabolic supraclavicular nodes. No hypermetabolic mediastinal nodes. Single very small nodule in the right upper lobe measures 4 mm on image 77, series 4.  ABDOMEN/PELVIS  No abnormal hypermetabolic activity within the liver, pancreas,  adrenal glands, or spleen. No hypermetabolic lymph nodes in the abdomen or pelvis.  Atherosclerotic calcification aorta noted.  SKELETON  No focal hypermetabolic activity to suggest skeletal metastasis.  IMPRESSION: 1. Intense metabolic activity associated with the left laryngeal mass. 2. A single mildly hypermetabolic small left level 2 lymph node. No additional evidence of metastatic adenopathy in neck. 3. No evidence of or distant metastatic adenopathy in the thorax. 4. Single small right upper lobe pulmonary nodule is likely benign. Recommend attention on follow-up   Electronically Signed   By: Suzy Bouchard M.D.   On: 07/27/2014 16:53   Ir Fluoro Guide Cv Line Right  07/28/2014   CLINICAL DATA:  Squamous cell carcinoma of the larynx  EXAM: RIGHT INTERNAL JUGULAR SINGLE LUMEN POWER PORT CATHETER INSERTION  Date:  7/15/20167/15/2016 3:42 pm  Radiologist:  M. Daryll Brod, MD  Guidance:  Ultrasound fluoroscopic  FLUOROSCOPY TIME:  30 seconds, 18 mGy  MEDICATIONS AND MEDICAL HISTORY: 2 g Ancef,administered within 1 hour of the procedure.6 mg Versed, 100 mcg fentanyl  ANESTHESIA/SEDATION: 27 minutes  CONTRAST:  None  COMPLICATIONS: None  PROCEDURE: Informed consent was obtained from the patient following explanation of the procedure, risks, benefits and alternatives. The patient understands, agrees and consents for the procedure. All questions were addressed. A time out was performed.  Maximal barrier sterile technique utilized including caps, mask, sterile gowns, sterile  gloves, large sterile drape, hand hygiene, and 2% chlorhexidine scrub.  Under sterile conditions and local anesthesia, right internal jugular micropuncture venous access was performed. Access was performed with ultrasound. Images were obtained for documentation. A guide wire was inserted followed by a transitional dilator. This allowed insertion of a guide wire and catheter into the IVC. Measurements were obtained from the SVC / RA junction back to the right IJ venotomy site. In the right infraclavicular chest, a subcutaneous pocket was created over the second anterior rib. This was done under sterile conditions and local anesthesia. 1% lidocaine with epinephrine was utilized for this. A 2.5 cm incision was made in the skin. Blunt dissection was performed to create a subcutaneous pocket over the right pectoralis major muscle. The pocket was flushed with saline vigorously. There was adequate hemostasis. The port catheter was assembled and checked for leakage. The port catheter was secured in the pocket with two retention sutures. The tubing was tunneled subcutaneously to the right venotomy site and inserted into the SVC/RA junction through a valved peel-away sheath. Position was confirmed with fluoroscopy. Images were obtained for documentation. The patient tolerated the procedure well. No immediate complications. Incisions were closed in a two layer fashion with 4 - 0 Vicryl suture. Dermabond was applied to the skin. The port catheter was accessed, blood was aspirated followed by saline and heparin flushes. Needle was removed. A dry sterile dressing was applied.  IMPRESSION: Ultrasound and fluoroscopically guided right internal jugular single lumen power port catheter insertion. Tip in the SVC/RA junction. Catheter ready for use.   Electronically Signed   By: Jerilynn Mages.  Shick M.D.   On: 07/28/2014 15:46   Ir US Guide Vasc Access Right  07/28/2014   CLINICAL DATA:  Squamous cell carcinoma of the larynx  EXAM: RIGHT  INTERNAL JUGULAR SINGLE LUMEN POWER PORT CATHETER INSERTION  Date:  7/15/20167/15/2016 3:42 pm  Radiologist:  M. Daryll Brod, MD  Guidance:  Ultrasound fluoroscopic  FLUOROSCOPY TIME:  30 seconds, 18 mGy  MEDICATIONS AND MEDICAL HISTORY: 2 g Ancef,administered within 1 hour of the procedure.6 mg Versed, 100 mcg  fentanyl  ANESTHESIA/SEDATION: 27 minutes  CONTRAST:  None  COMPLICATIONS: None  PROCEDURE: Informed consent was obtained from the patient following explanation of the procedure, risks, benefits and alternatives. The patient understands, agrees and consents for the procedure. All questions were addressed. A time out was performed.  Maximal barrier sterile technique utilized including caps, mask, sterile gowns, sterile gloves, large sterile drape, hand hygiene, and 2% chlorhexidine scrub.  Under sterile conditions and local anesthesia, right internal jugular micropuncture venous access was performed. Access was performed with ultrasound. Images were obtained for documentation. A guide wire was inserted followed by a transitional dilator. This allowed insertion of a guide wire and catheter into the IVC. Measurements were obtained from the SVC / RA junction back to the right IJ venotomy site. In the right infraclavicular chest, a subcutaneous pocket was created over the second anterior rib. This was done under sterile conditions and local anesthesia. 1% lidocaine with epinephrine was utilized for this. A 2.5 cm incision was made in the skin. Blunt dissection was performed to create a subcutaneous pocket over the right pectoralis major muscle. The pocket was flushed with saline vigorously. There was adequate hemostasis. The port catheter was assembled and checked for leakage. The port catheter was secured in the pocket with two retention sutures. The tubing was tunneled subcutaneously to the right venotomy site and inserted into the SVC/RA junction through a valved peel-away sheath. Position was confirmed with  fluoroscopy. Images were obtained for documentation. The patient tolerated the procedure well. No immediate complications. Incisions were closed in a two layer fashion with 4 - 0 Vicryl suture. Dermabond was applied to the skin. The port catheter was accessed, blood was aspirated followed by saline and heparin flushes. Needle was removed. A dry sterile dressing was applied.  IMPRESSION: Ultrasound and fluoroscopically guided right internal jugular single lumen power port catheter insertion. Tip in the SVC/RA junction. Catheter ready for use.   Electronically Signed   By: Jerilynn Mages.  Shick M.D.   On: 07/28/2014 15:46   Dg Chest Port 1 View  08/03/2014   CLINICAL DATA:  Respiratory failure.  Endotracheal tube position.  EXAM: PORTABLE CHEST - 1 VIEW  COMPARISON:  08/02/2014.  FINDINGS: 0441 hr. Endotracheal is approximately 3 cm above the carina. Nasogastric tube projects below the diaphragm, tip not visualized. There is a right IJ central venous catheter with its tip at the lower SVC level. The heart size and mediastinal contours are stable status post CABG. There are lower lung volumes with mildly increased left lower lobe atelectasis. No pneumothorax or significant pleural effusion identified. The bones appear unchanged.  IMPRESSION: Little change in position of the support system. Mildly increased left basilar atelectasis.   Electronically Signed   By: Richardean Sale M.D.   On: 08/03/2014 07:11   Dg Chest Portable 1 View  08/02/2014   CLINICAL DATA:  Status post CPR, intubation  EXAM: PORTABLE CHEST - 1 VIEW  COMPARISON:  None.  FINDINGS: Endotracheal tube with the tip 4 cm above the carina. Right-sided Port-A-Cath in satisfactory position.  Mild bilateral interstitial prominence. There is no focal parenchymal opacity. There is no pleural effusion or pneumothorax. The heart and mediastinal contours are unremarkable. Prior CABG.  The osseous structures are unremarkable.  IMPRESSION: 1. Endotracheal tube with the tip  4 cm above the carina.   Electronically Signed   By: Kathreen Devoid   On: 08/02/2014 10:26   Ct Angio Chest Aorta W/cm &/or Wo/cm  08/04/2014   CLINICAL  DATA:  Current history of laryngeal carcinoma. Patient discharged from the hospital earlier today after a brief cardiac arrest felt to be related to use of Cetuximab, also with no urine output since 0830 hr earlier today and no bowel movement in 2 days. She presents with abdominal pain and distention.  EXAM: CT ANGIOGRAPHY CHEST, ABDOMEN AND PELVIS  TECHNIQUE: Multidetector CT imaging through the chest, abdomen and pelvis was performed using the standard protocol before and during bolus administration of intravenous contrast. Multiplanar reconstructed images and MIPs were obtained and reviewed to evaluate the vascular anatomy.  CONTRAST:  1103mL OMNIPAQUE IOHEXOL 350 MG/ML IV. Oral contrast was also administered.  COMPARISON:  PET-CT 07/27/2014.  FINDINGS: CTA CHEST FINDINGS  Unenhanced images demonstrate no evidence of mural hematoma. Stent at the origin of the left subclavian artery is again noted.  Contrast enhancement of the thoracic aorta is excellent. No evidence of thoracic aortic dissection or aneurysm. Moderate atherosclerosis. Prior CABG, and the coronary graft to a diagonal branch of the LAD does opacify. A stent is present within the coronary graft. Central pulmonary arteries are patent. 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 The left subclavian artery stent is patent. Calcified and noncalcified plaque are present in the proximal innominate artery with mild stenosis. Calcified and noncalcified plaque are present at the origin of the right vertebral artery with moderate stenosis.  Heart mildly enlarged.  No pericardial effusion.  Emphysematous changes throughout both lungs. Dependent atelectasis posteriorly in the lower lobes. Small bilateral pleural effusions. Lungs otherwise clear without confluent airspace consolidation,  interstitial disease, or parenchymal nodules. Central airways patent with moderate central peribronchial thickening.  No significant mediastinal, hilar or axillary lymphadenopathy. Thyroid gland normal in appearance. Right jugular Port-A-Cath tip at the cavoatrial junction. Bone window images demonstrate osseous demineralization, mild diffuse thoracic spondylosis, multiple Schmorl's nodes, but no evidence of osseous metastatic disease.  Review of the MIP images confirms the above findings.  CTA ABDOMEN AND PELVIS FINDINGS  No evidence of abdominal aortic aneurysm or dissection. Severe atherosclerosis involving the abdominal aorta with calcified and noncalcified plaque. High-grade stenosis at the origin of the celiac artery. Mild stenosis at the origin of the SMA. High-grade stenosis at the origin of the IMA. High-grade stenoses at the origins of the renal arteries bilaterally. Severe atherosclerosis involving the iliofemoral arteries, with high-grade stenosis suspected in the left common iliac artery and moderate stenosis in the right common iliac artery.  Pneumoperitoneum, with free air scattered throughout the abdomen and pelvis. A midline supraumbilical abdominal wall hernia is present and there are gas bubbles in the subcutaneous fat of the abdominal wall at the level of the hernia. There are acute inflammatory changes in the right upper pelvis, where there are distal ileal loops and the mid sigmoid colon. I believe the sigmoid colon is the site of the perforation, as there is asymmetric wall thickening along its right lateral wall, and there is gas within the bowel wall. There are scattered diverticula involving the mid sigmoid colon in this region. There is no evidence of bowel obstruction. A small amount of ascites is present in the pelvis. A normal appendix is identified in the right mid abdomen.  Diffuse hepatic steatosis without focal hepatic parenchymal abnormality. Normal appearing spleen, pancreas,  right adrenal gland, and kidneys. Approximate 2.3 cm nodule arising from the left adrenal gland which was not hypermetabolic on the prior PET-CT. No significant lymphadenopathy involving the abdomen or pelvis. Small hiatal hernia; stomach  otherwise normal in appearance. Normal appearing urinary bladder. Prostate gland and seminal vesicles normal for age. Small left inguinal hernia containing fat.  Bone window images demonstrate degenerative changes involving the facet joints of the lower lumbar spine, degenerative changes involving the right sacroiliac joint, but no evidence of osseous metastatic disease.  Review of the MIP images confirms the above findings.  IMPRESSION: 1. Pneumoperitoneum. The origin of the free intraperitoneal air is felt to be perforated diverticulitis involving the mid sigmoid colon. 2. No evidence of thoracic or abdominal aortic dissection. 3. Generalized atherosclerosis as detailed above. 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 4. COPD/emphysema. Small bilateral pleural effusions and mild bilateral lower lobe atelectasis. No acute cardiopulmonary disease otherwise. 5. Diffuse hepatic steatosis. 6. Left adrenal adenoma. 7. Small hiatal hernia. 8. Small left inguinal hernia containing fat. 9. Midline supraumbilical abdominal wall hernia. Critical Value/emergent results were called by telephone at the time of interpretation on 08/04/2014 at 9:29 pm to Dr. Pattricia Boss, who verbally acknowledged these results.   Electronically Signed   By: Evangeline Dakin M.D.   On: 08/04/2014 21:34   Ct Angio Abd/pel W/ And/or W/o  08/04/2014   CLINICAL DATA:  Current history of laryngeal carcinoma. Patient discharged from the hospital earlier today after a brief cardiac arrest felt to be related to use of Cetuximab, also with no urine output since 0830 hr earlier today and no bowel movement in 2 days. She presents with abdominal pain and distention.  EXAM: CT ANGIOGRAPHY CHEST,  ABDOMEN AND PELVIS  TECHNIQUE: Multidetector CT imaging through the chest, abdomen and pelvis was performed using the standard protocol before and during bolus administration of intravenous contrast. Multiplanar reconstructed images and MIPs were obtained and reviewed to evaluate the vascular anatomy.  CONTRAST:  166mL OMNIPAQUE IOHEXOL 350 MG/ML IV. Oral contrast was also administered.  COMPARISON:  PET-CT 07/27/2014.  FINDINGS: CTA CHEST FINDINGS  Unenhanced images demonstrate no evidence of mural hematoma. Stent at the origin of the left subclavian artery is again noted.  Contrast enhancement of the thoracic aorta is excellent. No evidence of thoracic aortic dissection or aneurysm. Moderate atherosclerosis. Prior CABG, and the coronary graft to a diagonal branch of the LAD does opacify. A stent is present within the coronary graft. Central pulmonary arteries are patent. 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 The left subclavian artery stent is patent. Calcified and noncalcified plaque are present in the proximal innominate artery with mild stenosis. Calcified and noncalcified plaque are present at the origin of the right vertebral artery with moderate stenosis.  Heart mildly enlarged.  No pericardial effusion.  Emphysematous changes throughout both lungs. Dependent atelectasis posteriorly in the lower lobes. Small bilateral pleural effusions. Lungs otherwise clear without confluent airspace consolidation, interstitial disease, or parenchymal nodules. Central airways patent with moderate central peribronchial thickening.  No significant mediastinal, hilar or axillary lymphadenopathy. Thyroid gland normal in appearance. Right jugular Port-A-Cath tip at the cavoatrial junction. Bone window images demonstrate osseous demineralization, mild diffuse thoracic spondylosis, multiple Schmorl's nodes, but no evidence of osseous metastatic disease.  Review of the MIP images confirms the above  findings.  CTA ABDOMEN AND PELVIS FINDINGS  No evidence of abdominal aortic aneurysm or dissection. Severe atherosclerosis involving the abdominal aorta with calcified and noncalcified plaque. High-grade stenosis at the origin of the celiac artery. Mild stenosis at the origin of the SMA. High-grade stenosis at the origin of the IMA. High-grade stenoses at  the origins of the renal arteries bilaterally. Severe atherosclerosis involving the iliofemoral arteries, with high-grade stenosis suspected in the left common iliac artery and moderate stenosis in the right common iliac artery.  Pneumoperitoneum, with free air scattered throughout the abdomen and pelvis. A midline supraumbilical abdominal wall hernia is present and there are gas bubbles in the subcutaneous fat of the abdominal wall at the level of the hernia. There are acute inflammatory changes in the right upper pelvis, where there are distal ileal loops and the mid sigmoid colon. I believe the sigmoid colon is the site of the perforation, as there is asymmetric wall thickening along its right lateral wall, and there is gas within the bowel wall. There are scattered diverticula involving the mid sigmoid colon in this region. There is no evidence of bowel obstruction. A small amount of ascites is present in the pelvis. A normal appendix is identified in the right mid abdomen.  Diffuse hepatic steatosis without focal hepatic parenchymal abnormality. Normal appearing spleen, pancreas, right adrenal gland, and kidneys. Approximate 2.3 cm nodule arising from the left adrenal gland which was not hypermetabolic on the prior PET-CT. No significant lymphadenopathy involving the abdomen or pelvis. Small hiatal hernia; stomach otherwise normal in appearance. Normal appearing urinary bladder. Prostate gland and seminal vesicles normal for age. Small left inguinal hernia containing fat.  Bone window images demonstrate degenerative changes involving the facet joints of the  lower lumbar spine, degenerative changes involving the right sacroiliac joint, but no evidence of osseous metastatic disease.  Review of the MIP images confirms the above findings.  IMPRESSION: 1. Pneumoperitoneum. The origin of the free intraperitoneal air is felt to be perforated diverticulitis involving the mid sigmoid colon. 2. No evidence of thoracic or abdominal aortic dissection. 3. Generalized atherosclerosis as detailed above. 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 4. COPD/emphysema. Small bilateral pleural effusions and mild bilateral lower lobe atelectasis. No acute cardiopulmonary disease otherwise. 5. Diffuse hepatic steatosis. 6. Left adrenal adenoma. 7. Small hiatal hernia. 8. Small left inguinal hernia containing fat. 9. Midline supraumbilical abdominal wall hernia. Critical Value/emergent results were called by telephone at the time of interpretation on 08/04/2014 at 9:29 pm to Dr. Pattricia Boss, who verbally acknowledged these results.   Electronically Signed   By: Evangeline Dakin M.D.   On: 08/04/2014 21:34    09/27/13 TTE Study Conclusions  - Left ventricle: The cavity size was normal. There was mild concentric hypertrophy. Systolic function was mildly reduced. The estimated ejection fraction was in the range of 45% to 50%. Hypokinesis of the basal and mid anterior and anteroseptal walls and apical septal wall. Doppler parameters are consistent with abnormal left ventricular relaxation (grade 1 diastolic dysfunction). There was no evidence of elevated ventricular filling pressure by Doppler parameters. - Aortic valve: Trileaflet; normal thickness leaflets. - Mitral valve: Structurally normal valve. - Right ventricle: The cavity size was normal. Wall thickness was normal. Systolic function was mildly reduced. - Right atrium: The atrium was normal in size. - Pulmonary arteries: Systolic pressure was within the  normal range.  Impressions:  - Compared to the prior study from 06/10/2013 left ventricular systolic function has significantly improved now 45-50%, previously 10-15%. Diastolic function is grade 1 with normal filling pressures. Normal size RV with mildly decreased systolic function.  ECG  NSR, STD and TWI in II, aVF, V4-V6. TWI V2. QT prolongation. Compared to ECG on 08/02/14 the STD and TWI are new.   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   He has bowel perforation with evolving abdominal sepsis as demonstrated by progressive hypotension with lactic acidosis. He is having progressive ischemic ECGs, rising troponin, but without chest pain or dyspnea and shoulder discomfort which he has had prior to MIs in the past. MI is a reported complication of Cetuximab. It is possible that the Tn and ECG changes are secondary to sepsis but it is difficult to really know in this situation.   At this point, although Mr. Guia is high risk, he will need to proceed with the potentially life saving bowel surgery. He is aware that his risk of having a cardiovascular complication, include STEMI requiring emergent cardiac catheterization, is high. Given progressive sepsis, perioperative beta blockers are not going to be an options.   - 81mg  ASA daily - TTE post operatively - we will follow closely  Signed, Lamar Sprinkles, MD 08/05/2014, 12:24 AM

## 2014-08-04 NOTE — ED Notes (Signed)
Ray made aware of GI assessment; 2nd unsuccessful IV attempts by this RN.

## 2014-08-04 NOTE — ED Notes (Signed)
Intensivist at bedside.

## 2014-08-04 NOTE — ED Notes (Signed)
Pt reports unable to pass gas/BM since Wednesday morning; last void 0830 today; see bladder scan documentation (85 ml).

## 2014-08-04 NOTE — ED Notes (Signed)
Ray, EDP given I-stat troponin result.

## 2014-08-04 NOTE — Progress Notes (Signed)
[name]   DOB:05/13/1955   ER#:[id number]   EHU#:[id number]  Patient Care Team: [name], NP as PCP - General [name], RN as Oncology Nurse Navigator [name], MD as Consulting Physician (Radiation Oncology) [name], MD as Consulting Physician (Hematology and Oncology) [name], RD as Dietitian (Nutrition)  I have seen the patient, examined him and edited the notes as follows  Subjective: Patient seen and examined. He is now extubated since 7/21. He is awake, alert, conversant. He reports feeling better. Denies fevers, chills, night sweats, vision changes, or mucositis. Denies any respiratory complaints. Denies any chest pain or palpitations. Denies lower extremity swelling. Denies nausea or vomiting. Denies heartburn or change in bowel habits. Last bowel movement prior to admission. Denies any dysuria. Denies abnormal skin rashes, or neuropathy. Pain is well controlled with current regimen. Denies any bleeding issues such as epistaxis, hematemesis, hematuria or hematochezia.  He complained of persistent mild sore throat and soreness on his chest wall from recent CPR  Scheduled Meds: . aspirin EC  81 mg Oral Daily  . buPROPion  300 mg Oral Daily  . carvedilol  12.5 mg Oral BID WC  . enoxaparin (LOVENOX) injection  40 mg Subcutaneous Q24H  . fentaNYL  25 mcg Transdermal Q72H   Continuous Infusions: . sodium chloride 1,000 mL (08/03/14 1200)   PRN Meds:.sodium chloride, acetaminophen, albuterol, ALPRAZolam, bisacodyl, diphenhydrAMINE-zinc acetate, fentaNYL (SUBLIMAZE) injection, ondansetron (ZOFRAN) IV  Objective:  Filed Vitals:   08/04/14 0545  BP: 100/46  Pulse: 74  Temp: 97.8 F (36.6 C)  Resp: 18      Intake/Output Summary (Last 24 hours) at 08/04/14 0657 Last data filed at 08/04/14 0600  Gross per 24 hour  Intake    530 ml  Output    800 ml  Net   -270 ml    GENERAL: Awake, alert, comfortable SKIN: skin color, texture, turgor are  pale, no rashes or significant lesions. Several tattoos EYES: normal, conjunctiva are pink and non-injected, sclera clear OROPHARYNX: clear, no thrush or mucositis noted. NECK: supple, thyroid normal size, non-tender, without nodularity LYMPH: no palpable lymphadenopathy in the cervical, axillary or inguinal LUNGS:essentially clear to auscultation.  Good air movement.  HEART: regular rate & rhythm and no murmurs and no lower extremity edema. Right port a cath present, non tender. ABDOMEN: soft, non-tender and normal bowel sounds Musculoskeletal:no clubbing or cyanosis. PSYCH: alert & oriented, conversant. NEURO: no focal motor/sensory deficits    CBG (last 3)  No results for input(s): GLUCAP in the last 72 hours.   Labs:   Recent Labs Lab 07/28/14 1230 08/01/14 1223 08/02/14 0954 08/03/14 0600 08/04/14 0353  WBC 11.8* 8.7 12.2* 14.9* 15.8*  HGB 10.7* 10.6* 11.8* 10.6* 8.8*  HCT 32.9* 32.8* 37.3* 32.1* 26.7*  PLT 379 363 324 327 297  MCV 91.6 93.2 95.4 91.2 93.7  MCH 29.8 30.1 30.2 30.1 30.9  MCHC 32.5 32.3 31.6 33.0 33.0  RDW 14.6 14.5 14.5 14.6 15.2  LYMPHSABS 2.8 2.6 4.5*  --  1.1  MONOABS 1.3* 1.2* 0.5  --  1.0  EOSABS 0.2 0.2 0.0  --  0.0  BASOSABS 0.0 0.0 0.0  --  0.0     Chemistries:    Recent Labs Lab 08/01/14 1223 08/02/14 0954 08/03/14 0600 08/04/14 0353  NA 137 137 137 138  K 4.3 4.3 4.2 4.1  CL  --  105 107 106  CO2 28 23 22 26   GLUCOSE 101 222* 175* 144*  BUN 14.1 17 18 19   CREATININE 1.1 1.26* 1.06 0.95  CALCIUM 9.3 8.0* 8.3* 8.4*  MG 2.3  --   --   --   AST 26  --   --   --   ALT 25  --   --   --   ALKPHOS 118  --   --   --   BILITOT 0.45  --   --   --      Imaging Studies:  Dg Chest Port 1 View  08/03/2014   CLINICAL DATA:  Respiratory failure.  Endotracheal tube position.  EXAM: PORTABLE CHEST - 1 VIEW  COMPARISON:  08/02/2014.  FINDINGS: 0441 hr. Endotracheal is approximately 3 cm above the carina. Nasogastric tube projects below  the diaphragm, tip not visualized. There is a right IJ central venous catheter with its tip at the lower SVC level. The heart size and mediastinal contours are stable status post CABG. There are lower lung volumes with mildly increased left lower lobe atelectasis. No pneumothorax or significant pleural effusion identified. The bones appear unchanged.  IMPRESSION: Little change in position of the support system. Mildly increased left basilar atelectasis.   Electronically Signed   By: Richardean Sale M.D.   On: 08/03/2014 07:11   Dg Chest Portable 1 View  08/02/2014   CLINICAL DATA:  Status post CPR, intubation  EXAM: PORTABLE CHEST - 1 VIEW  COMPARISON:  None.  FINDINGS: Endotracheal tube with the tip 4 cm above the carina. Right-sided Port-A-Cath in satisfactory position.  Mild bilateral interstitial prominence. There is no focal parenchymal opacity. There is no pleural effusion or pneumothorax. The heart and mediastinal contours are unremarkable. Prior CABG.  The osseous structures are unremarkable.  IMPRESSION: 1. Endotracheal tube with the tip 4 cm above the carina.   Electronically Signed   By: Kathreen Devoid   On: 08/02/2014 10:26    Assessment/Plan: 59 y.o.  Squamous Cell Carcinoma of the Larynx S/p simulation on 7/13 for Intensity Modulated Radiotherapy (IMRT) medically necessary for parotid sparing which results in a gain of quality of life by preservation of salivary gland function. In addition the tumor volume is in close proximity to the spinal cord.  He was to initiate C1 D1 concurrent chemo with cetuximab Suspected drug induced reaction, resulting in cardiac arrest, chemo discontinued He was intubated and resuscitated Radiation oncologist has been informed that we are not willing to proceed with further systemic treatment in the future. Patient and his daughter were also informed that he will still have good benefit with radiation alone. He will receive radiation after discharge, scheduled  to start on 7/25  Acute Respiratory Failure Cardiac Arrest Suspected medication related, in the setting of first cycle of chemotherapy with Cetuximab at the [facility] He was intubated and resuscitated. Dr. Alvy Bimler was present during the resuscitation effort. He was admitted via Emergency Department to the ICU He also has a history of ischemic cardiomyopathy, with most recent EF at 45 percent prior to this event.  He was extubated on 7/90 without complications. He is recovering well. No new cardiac issues noted overnight. 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 Med regimen is adequate He will return to his prescribed outpatient regimen when discharged home.  DVT prophylaxis On Lovenox  Full Code Other medical issues as per admitting team   Discharge planning As per admitting team. Hopefully he can be discharged today. I will see him back in the office as scheduled for supportive care and pain management. Will sign off  Elease Hashimoto 08/04/2014  6:57 AM Ganesh Deeg, MD 08/04/2014

## 2014-08-04 NOTE — Telephone Encounter (Signed)
Return [name] °

## 2014-08-05 ENCOUNTER — Inpatient Hospital Stay (HOSPITAL_COMMUNITY): Payer: Medicaid Other | Admitting: Anesthesiology

## 2014-08-05 ENCOUNTER — Other Ambulatory Visit (HOSPITAL_COMMUNITY): Payer: Self-pay

## 2014-08-05 ENCOUNTER — Encounter (HOSPITAL_COMMUNITY)
Admission: EM | Disposition: A | Discharge status: Home Health | Payer: Self-pay | Source: Home / Self Care | Attending: Emergency Medicine

## 2014-08-05 ENCOUNTER — Inpatient Hospital Stay (HOSPITAL_COMMUNITY): Payer: Medicaid Other

## 2014-08-05 DIAGNOSIS — Z79891 Long term (current) use of opiate analgesic: Secondary | ICD-10-CM | POA: Diagnosis not present

## 2014-08-05 DIAGNOSIS — I471 Supraventricular tachycardia: Secondary | ICD-10-CM | POA: Diagnosis not present

## 2014-08-05 DIAGNOSIS — I214 Non-ST elevation (NSTEMI) myocardial infarction: Secondary | ICD-10-CM | POA: Diagnosis not present

## 2014-08-05 DIAGNOSIS — K659 Peritonitis, unspecified: Secondary | ICD-10-CM | POA: Diagnosis not present

## 2014-08-05 DIAGNOSIS — E785 Hyperlipidemia, unspecified: Secondary | ICD-10-CM | POA: Diagnosis present

## 2014-08-05 DIAGNOSIS — Z791 Long term (current) use of non-steroidal anti-inflammatories (NSAID): Secondary | ICD-10-CM | POA: Diagnosis not present

## 2014-08-05 DIAGNOSIS — I469 Cardiac arrest, cause unspecified: Secondary | ICD-10-CM | POA: Diagnosis not present

## 2014-08-05 DIAGNOSIS — J95821 Acute postprocedural respiratory failure: Secondary | ICD-10-CM | POA: Diagnosis not present

## 2014-08-05 DIAGNOSIS — R52 Pain, unspecified: Secondary | ICD-10-CM | POA: Diagnosis present

## 2014-08-05 DIAGNOSIS — I739 Peripheral vascular disease, unspecified: Secondary | ICD-10-CM

## 2014-08-05 DIAGNOSIS — I2510 Atherosclerotic heart disease of native coronary artery without angina pectoris: Secondary | ICD-10-CM | POA: Diagnosis not present

## 2014-08-05 DIAGNOSIS — A419 Sepsis, unspecified organism: Secondary | ICD-10-CM | POA: Diagnosis not present

## 2014-08-05 DIAGNOSIS — Z9221 Personal history of antineoplastic chemotherapy: Secondary | ICD-10-CM | POA: Diagnosis not present

## 2014-08-05 DIAGNOSIS — C329 Malignant neoplasm of larynx, unspecified: Secondary | ICD-10-CM | POA: Diagnosis not present

## 2014-08-05 DIAGNOSIS — I259 Chronic ischemic heart disease, unspecified: Secondary | ICD-10-CM | POA: Diagnosis not present

## 2014-08-05 DIAGNOSIS — C321 Malignant neoplasm of supraglottis: Secondary | ICD-10-CM | POA: Diagnosis not present

## 2014-08-05 DIAGNOSIS — J438 Other emphysema: Secondary | ICD-10-CM | POA: Diagnosis not present

## 2014-08-05 DIAGNOSIS — I10 Essential (primary) hypertension: Secondary | ICD-10-CM | POA: Diagnosis present

## 2014-08-05 DIAGNOSIS — Z8249 Family history of ischemic heart disease and other diseases of the circulatory system: Secondary | ICD-10-CM | POA: Diagnosis not present

## 2014-08-05 DIAGNOSIS — Z510 Encounter for antineoplastic radiation therapy: Secondary | ICD-10-CM | POA: Diagnosis present

## 2014-08-05 DIAGNOSIS — G893 Neoplasm related pain (acute) (chronic): Secondary | ICD-10-CM | POA: Diagnosis present

## 2014-08-05 DIAGNOSIS — K5720 Diverticulitis of large intestine with perforation and abscess without bleeding: Secondary | ICD-10-CM | POA: Diagnosis present

## 2014-08-05 DIAGNOSIS — F419 Anxiety disorder, unspecified: Secondary | ICD-10-CM | POA: Diagnosis present

## 2014-08-05 DIAGNOSIS — R6521 Severe sepsis with septic shock: Secondary | ICD-10-CM | POA: Diagnosis not present

## 2014-08-05 DIAGNOSIS — Z7982 Long term (current) use of aspirin: Secondary | ICD-10-CM | POA: Diagnosis not present

## 2014-08-05 DIAGNOSIS — I252 Old myocardial infarction: Secondary | ICD-10-CM | POA: Diagnosis not present

## 2014-08-05 DIAGNOSIS — Z955 Presence of coronary angioplasty implant and graft: Secondary | ICD-10-CM | POA: Diagnosis not present

## 2014-08-05 DIAGNOSIS — Z87891 Personal history of nicotine dependence: Secondary | ICD-10-CM | POA: Diagnosis not present

## 2014-08-05 DIAGNOSIS — I255 Ischemic cardiomyopathy: Secondary | ICD-10-CM | POA: Diagnosis present

## 2014-08-05 DIAGNOSIS — Z8719 Personal history of other diseases of the digestive system: Secondary | ICD-10-CM | POA: Diagnosis present

## 2014-08-05 DIAGNOSIS — Z951 Presence of aortocoronary bypass graft: Secondary | ICD-10-CM | POA: Diagnosis not present

## 2014-08-05 DIAGNOSIS — D630 Anemia in neoplastic disease: Secondary | ICD-10-CM | POA: Diagnosis present

## 2014-08-05 DIAGNOSIS — E876 Hypokalemia: Secondary | ICD-10-CM | POA: Diagnosis not present

## 2014-08-05 DIAGNOSIS — I361 Nonrheumatic tricuspid (valve) insufficiency: Secondary | ICD-10-CM | POA: Diagnosis not present

## 2014-08-05 DIAGNOSIS — R7989 Other specified abnormal findings of blood chemistry: Secondary | ICD-10-CM | POA: Diagnosis not present

## 2014-08-05 DIAGNOSIS — Z8674 Personal history of sudden cardiac arrest: Secondary | ICD-10-CM | POA: Diagnosis not present

## 2014-08-05 DIAGNOSIS — Z79899 Other long term (current) drug therapy: Secondary | ICD-10-CM | POA: Diagnosis not present

## 2014-08-05 DIAGNOSIS — Z888 Allergy status to other drugs, medicaments and biological substances status: Secondary | ICD-10-CM | POA: Diagnosis not present

## 2014-08-05 DIAGNOSIS — K219 Gastro-esophageal reflux disease without esophagitis: Secondary | ICD-10-CM | POA: Diagnosis present

## 2014-08-05 DIAGNOSIS — K631 Perforation of intestine (nontraumatic): Secondary | ICD-10-CM | POA: Diagnosis not present

## 2014-08-05 DIAGNOSIS — R34 Anuria and oliguria: Secondary | ICD-10-CM | POA: Diagnosis present

## 2014-08-05 DIAGNOSIS — J449 Chronic obstructive pulmonary disease, unspecified: Secondary | ICD-10-CM | POA: Diagnosis present

## 2014-08-05 HISTORY — PX: LAPAROTOMY: SHX154

## 2014-08-05 LAB — COMPREHENSIVE METABOLIC PANEL
ALK PHOS: 71 U/L (ref 38–126)
ALT: 41 U/L (ref 17–63)
ANION GAP: 7 (ref 5–15)
AST: 38 U/L (ref 15–41)
Albumin: 3.1 g/dL — ABNORMAL LOW (ref 3.5–5.0)
BILIRUBIN TOTAL: 0.4 mg/dL (ref 0.3–1.2)
BUN: 18 mg/dL (ref 6–20)
CHLORIDE: 103 mmol/L (ref 101–111)
CO2: 26 mmol/L (ref 22–32)
Calcium: 8.2 mg/dL — ABNORMAL LOW (ref 8.9–10.3)
Creatinine, Ser: 1.07 mg/dL (ref 0.61–1.24)
GFR calc Af Amer: >60 mL/min (ref >60)
GFR calc non Af Amer: >60 mL/min (ref >60)
GLUCOSE: 98 mg/dL (ref 65–99)
POTASSIUM: 4.1 mmol/L (ref 3.5–5.1)
Sodium: 136 mmol/L (ref 135–145)
Total Protein: 5.8 g/dL — ABNORMAL LOW (ref 6.5–8.1)

## 2014-08-05 LAB — GLUCOSE, CAPILLARY
GLUCOSE-CAPILLARY: 105 mg/dL — AB (ref 65–99)
Glucose-Capillary: 97 mg/dL (ref 65–99)
Glucose-Capillary: 95 mg/dL (ref 65–99)

## 2014-08-05 LAB — URINALYSIS, ROUTINE W REFLEX MICROSCOPIC
Bilirubin Urine: NEGATIVE
Glucose, UA: NEGATIVE mg/dL
Hgb urine dipstick: NEGATIVE
Ketones, ur: NEGATIVE mg/dL
LEUKOCYTES UA: NEGATIVE
NITRITE: NEGATIVE
PROTEIN: NEGATIVE mg/dL
Specific Gravity, Urine: <1.005 — ABNORMAL LOW (ref 1.005–1.030)
Urobilinogen, UA: 0.2 mg/dL (ref 0.0–1.0)
pH: 5.5 (ref 5.0–8.0)

## 2014-08-05 LAB — SURGICAL PCR SCREEN
MRSA, PCR: NEGATIVE
Staphylococcus aureus: NEGATIVE

## 2014-08-05 LAB — POCT I-STAT 7, (LYTES, BLD GAS, ICA,H+H)
Acid-base deficit: 4 mmol/L — ABNORMAL HIGH (ref 0.0–2.0)
BICARBONATE: 20.9 meq/L (ref 20.0–24.0)
Calcium, Ion: 1.07 mmol/L — ABNORMAL LOW (ref 1.13–1.30)
HCT: 24 % — ABNORMAL LOW (ref 39.0–52.0)
Hemoglobin: 8.2 g/dL — ABNORMAL LOW (ref 13.0–17.0)
O2 Saturation: 100 %
PCO2 ART: 39.1 mmHg (ref 35.0–45.0)
PO2 ART: 318 mmHg — AB (ref 80.0–100.0)
Patient temperature: 37.5
Potassium: 3.6 mmol/L (ref 3.5–5.1)
Sodium: 138 mmol/L (ref 135–145)
TCO2: 22 mmol/L (ref 0–100)
pH, Arterial: 7.338 — ABNORMAL LOW (ref 7.350–7.450)

## 2014-08-05 LAB — POCT I-STAT 3, ART BLOOD GAS (G3+)
Acid-base deficit: 1 mmol/L (ref 0.0–2.0)
Bicarbonate: 24.3 mEq/L — ABNORMAL HIGH (ref 20.0–24.0)
O2 Saturation: 95 %
PCO2 ART: 41.6 mmHg (ref 35.0–45.0)
PH ART: 7.375 (ref 7.350–7.450)
PO2 ART: 80 mmHg (ref 80.0–100.0)
Patient temperature: 98.9
TCO2: 26 mmol/L (ref 0–100)

## 2014-08-05 LAB — CBC
HCT: 31.6 % — ABNORMAL LOW (ref 39.0–52.0)
Hemoglobin: 10.4 g/dL — ABNORMAL LOW (ref 13.0–17.0)
MCH: 30.8 pg (ref 26.0–34.0)
MCHC: 32.9 g/dL (ref 30.0–36.0)
MCV: 93.5 fL (ref 78.0–100.0)
Platelets: 283 10*3/uL (ref 150–400)
RBC: 3.38 MIL/uL — AB (ref 4.22–5.81)
RDW: 15.2 % (ref 11.5–15.5)
WBC: 10.1 10*3/uL (ref 4.0–10.5)

## 2014-08-05 LAB — LIPASE, BLOOD: LIPASE: 12 U/L — AB (ref 22–51)

## 2014-08-05 LAB — TROPONIN I
TROPONIN I: 0.6 ng/mL — AB (ref <0.031)
Troponin I: 1.54 ng/mL (ref <0.031)
Troponin I: 0.94 ng/mL (ref <0.031)

## 2014-08-05 LAB — PREPARE RBC (CROSSMATCH)

## 2014-08-05 LAB — PHOSPHORUS: Phosphorus: 2.4 mg/dL — ABNORMAL LOW (ref 2.5–4.6)

## 2014-08-05 LAB — CG4 I-STAT (LACTIC ACID): Lactic Acid, Venous: 2.08 mmol/L (ref 0.5–2.0)

## 2014-08-05 LAB — BRAIN NATRIURETIC PEPTIDE: B NATRIURETIC PEPTIDE 5: 780.2 pg/mL — AB (ref 0.0–100.0)

## 2014-08-05 LAB — MAGNESIUM: Magnesium: 1.9 mg/dL (ref 1.7–2.4)

## 2014-08-05 LAB — ABO/RH: ABO/RH(D): O POS

## 2014-08-05 LAB — LACTIC ACID, PLASMA: Lactic Acid, Venous: 3.2 mmol/L (ref 0.5–2.0)

## 2014-08-05 SURGERY — LAPAROTOMY, EXPLORATORY
Anesthesia: General

## 2014-08-05 SURGERY — LAPAROTOMY, EXPLORATORY
Anesthesia: General | Site: Abdomen

## 2014-08-05 MED ORDER — MIDAZOLAM HCL 2 MG/2ML IJ SOLN
INTRAMUSCULAR | Status: AC
Start: 2014-08-05 — End: 2014-08-05
  Filled 2014-08-05: qty 2

## 2014-08-05 MED ORDER — SUCCINYLCHOLINE CHLORIDE 20 MG/ML IJ SOLN
INTRAMUSCULAR | Status: AC
  Filled 2014-08-05: qty 1

## 2014-08-05 MED ORDER — HYDROMORPHONE HCL 1 MG/ML IJ SOLN
0.5000 mg | INTRAMUSCULAR | Status: DC | PRN
  Administered 2014-08-05 – 2014-08-10 (×7): 0.5 mg via INTRAVENOUS
  Filled 2014-08-05 (×7): qty 1

## 2014-08-05 MED ORDER — FENTANYL BOLUS VIA INFUSION
50.0000 ug | INTRAVENOUS | Status: DC | PRN
  Administered 2014-08-06 – 2014-08-07 (×2): 50 ug via INTRAVENOUS
  Filled 2014-08-05: qty 50

## 2014-08-05 MED ORDER — PIPERACILLIN-TAZOBACTAM 3.375 G IVPB 30 MIN: 3.3750 g | Freq: Three times a day (TID) | INTRAVENOUS | Status: DC

## 2014-08-05 MED ORDER — SODIUM CHLORIDE 0.9 % IV SOLN: Freq: Once | INTRAVENOUS | Status: DC

## 2014-08-05 MED ORDER — MIDAZOLAM HCL 2 MG/2ML IJ SOLN: 2.0000 mg | INTRAMUSCULAR | Status: DC | PRN

## 2014-08-05 MED ORDER — INSULIN ASPART 100 UNIT/ML ~~LOC~~ SOLN: 2.0000 [IU] | SUBCUTANEOUS | Status: DC

## 2014-08-05 MED ORDER — MIDAZOLAM HCL 5 MG/5ML IJ SOLN
INTRAMUSCULAR | Status: DC | PRN
  Administered 2014-08-05 (×2): 1 mg via INTRAVENOUS

## 2014-08-05 MED ORDER — ALBUMIN HUMAN 5 % IV SOLN
INTRAVENOUS | Status: DC | PRN
  Administered 2014-08-05: 12:00:00 via INTRAVENOUS

## 2014-08-05 MED ORDER — FENTANYL 25 MCG/HR TD PT72
25.0000 ug | MEDICATED_PATCH | TRANSDERMAL | Status: DC
  Filled 2014-08-05: qty 1

## 2014-08-05 MED ORDER — MORPHINE SULFATE 2 MG/ML IJ SOLN: 1.0000 mg | INTRAMUSCULAR | Status: DC | PRN

## 2014-08-05 MED ORDER — SODIUM CHLORIDE 0.9 % IV BOLUS (SEPSIS)
1000.0000 mL | Freq: Once | INTRAVENOUS | Status: AC
  Administered 2014-08-05: 1000 mL via INTRAVENOUS

## 2014-08-05 MED ORDER — CARVEDILOL 12.5 MG PO TABS
12.5000 mg | ORAL_TABLET | Freq: Two times a day (BID) | ORAL | Status: DC
  Filled 2014-08-05 (×5): qty 1

## 2014-08-05 MED ORDER — FENTANYL CITRATE (PF) 250 MCG/5ML IJ SOLN
INTRAMUSCULAR | Status: AC
  Filled 2014-08-05: qty 5

## 2014-08-05 MED ORDER — ROCURONIUM BROMIDE 50 MG/5ML IV SOLN
INTRAVENOUS | Status: AC
  Filled 2014-08-05: qty 1

## 2014-08-05 MED ORDER — ROCURONIUM BROMIDE 100 MG/10ML IV SOLN
INTRAVENOUS | Status: DC | PRN
  Administered 2014-08-05: 40 mg via INTRAVENOUS
  Administered 2014-08-05: 3 mg via INTRAVENOUS

## 2014-08-05 MED ORDER — DEXTROSE-NACL 5-0.9 % IV SOLN
INTRAVENOUS | Status: AC
  Administered 2014-08-05: 125 mL via INTRAVENOUS

## 2014-08-05 MED ORDER — CHLORHEXIDINE GLUCONATE 0.12 % MT SOLN
15.0000 mL | Freq: Two times a day (BID) | OROMUCOSAL | Status: DC
  Administered 2014-08-05 – 2014-08-06 (×2): 15 mL via OROMUCOSAL
  Filled 2014-08-05 (×2): qty 15

## 2014-08-05 MED ORDER — SUCCINYLCHOLINE CHLORIDE 20 MG/ML IJ SOLN
INTRAMUSCULAR | Status: DC | PRN
  Administered 2014-08-05: 100 mg via INTRAVENOUS

## 2014-08-05 MED ORDER — SODIUM CHLORIDE 0.9 % IV SOLN
250.0000 mL | INTRAVENOUS | Status: DC | PRN
  Administered 2014-08-05: 12:00:00 via INTRAVENOUS
  Administered 2014-08-05: 250 mL via INTRAVENOUS

## 2014-08-05 MED ORDER — VANCOMYCIN HCL IN DEXTROSE 1-5 GM/200ML-% IV SOLN
1000.0000 mg | Freq: Two times a day (BID) | INTRAVENOUS | Status: DC
  Administered 2014-08-05 – 2014-08-08 (×8): 1000 mg via INTRAVENOUS
  Filled 2014-08-05 (×10): qty 200

## 2014-08-05 MED ORDER — MIDAZOLAM HCL 2 MG/2ML IJ SOLN
INTRAMUSCULAR | Status: AC
  Filled 2014-08-05: qty 2

## 2014-08-05 MED ORDER — ARTIFICIAL TEARS OP OINT
TOPICAL_OINTMENT | OPHTHALMIC | Status: AC
  Filled 2014-08-05: qty 3.5

## 2014-08-05 MED ORDER — 0.9 % SODIUM CHLORIDE (POUR BTL) OPTIME
TOPICAL | Status: DC | PRN
  Administered 2014-08-05: 3000 mL

## 2014-08-05 MED ORDER — FENTANYL CITRATE (PF) 100 MCG/2ML IJ SOLN: 50.0000 ug | Freq: Once | INTRAMUSCULAR | Status: DC

## 2014-08-05 MED ORDER — ONDANSETRON HCL 4 MG/2ML IJ SOLN
INTRAMUSCULAR | Status: AC
  Filled 2014-08-05: qty 2

## 2014-08-05 MED ORDER — CETYLPYRIDINIUM CHLORIDE 0.05 % MT LIQD
7.0000 mL | Freq: Four times a day (QID) | OROMUCOSAL | Status: DC
  Administered 2014-08-05 – 2014-08-06 (×3): 7 mL via OROMUCOSAL

## 2014-08-05 MED ORDER — LIDOCAINE HCL (CARDIAC) 20 MG/ML IV SOLN
INTRAVENOUS | Status: AC
  Filled 2014-08-05: qty 5

## 2014-08-05 MED ORDER — CETYLPYRIDINIUM CHLORIDE 0.05 % MT LIQD
7.0000 mL | Freq: Two times a day (BID) | OROMUCOSAL | Status: DC
  Administered 2014-08-05: 7 mL via OROMUCOSAL

## 2014-08-05 MED ORDER — PIPERACILLIN-TAZOBACTAM 3.375 G IVPB
3.3750 g | Freq: Three times a day (TID) | INTRAVENOUS | Status: DC
  Administered 2014-08-05 – 2014-08-12 (×21): 3.375 g via INTRAVENOUS
  Filled 2014-08-05 (×24): qty 50

## 2014-08-05 MED ORDER — ALPRAZOLAM 0.5 MG PO TABS
0.5000 mg | ORAL_TABLET | Freq: Three times a day (TID) | ORAL | Status: DC | PRN
  Administered 2014-08-06 – 2014-08-11 (×10): 0.5 mg via ORAL
  Filled 2014-08-05 (×11): qty 1

## 2014-08-05 MED ORDER — BISACODYL 10 MG RE SUPP: 10.0000 mg | Freq: Every day | RECTAL | Status: DC | PRN

## 2014-08-05 MED ORDER — FENTANYL CITRATE (PF) 100 MCG/2ML IJ SOLN
INTRAMUSCULAR | Status: DC | PRN
  Administered 2014-08-05: 50 ug via INTRAVENOUS
  Administered 2014-08-05 (×2): 100 ug via INTRAVENOUS

## 2014-08-05 MED ORDER — PROPOFOL 10 MG/ML IV BOLUS
INTRAVENOUS | Status: DC | PRN
  Administered 2014-08-05: 100 mg via INTRAVENOUS

## 2014-08-05 MED ORDER — LIDOCAINE HCL (CARDIAC) 20 MG/ML IV SOLN
INTRAVENOUS | Status: DC | PRN
  Administered 2014-08-05: 50 mg via INTRAVENOUS

## 2014-08-05 MED ORDER — VANCOMYCIN HCL IN DEXTROSE 1-5 GM/200ML-% IV SOLN: 1000.0000 mg | Freq: Two times a day (BID) | INTRAVENOUS | Status: DC

## 2014-08-05 MED ORDER — SODIUM CHLORIDE 0.9 % IV BOLUS (SEPSIS)
500.0000 mL | Freq: Once | INTRAVENOUS | Status: AC
  Administered 2014-08-05: 500 mL via INTRAVENOUS

## 2014-08-05 MED ORDER — SODIUM CHLORIDE 0.9 % IV SOLN
25.0000 ug/h | INTRAVENOUS | Status: DC
  Administered 2014-08-05: 50 ug/h via INTRAVENOUS
  Administered 2014-08-06: 25 ug/h via INTRAVENOUS
  Filled 2014-08-05 (×2): qty 50

## 2014-08-05 MED ORDER — EPHEDRINE SULFATE 50 MG/ML IJ SOLN
INTRAMUSCULAR | Status: AC
  Filled 2014-08-05: qty 1

## 2014-08-05 MED ORDER — LACTATED RINGERS IV SOLN
INTRAVENOUS | Status: DC | PRN
  Administered 2014-08-05: 11:00:00 via INTRAVENOUS

## 2014-08-05 MED ORDER — ASPIRIN 81 MG PO CHEW: 324.0000 mg | CHEWABLE_TABLET | ORAL | Status: AC

## 2014-08-05 MED ORDER — ASPIRIN 300 MG RE SUPP
300.0000 mg | RECTAL | Status: AC
  Administered 2014-08-05: 300 mg via RECTAL
  Filled 2014-08-05: qty 1

## 2014-08-05 SURGICAL SUPPLY — 53 items
BLADE SURG ROTATE 9660 (MISCELLANEOUS) IMPLANT
BNDG GAUZE ELAST 4 BULKY (GAUZE/BANDAGES/DRESSINGS) ×3 IMPLANT
CANISTER SUCTION 2500CC (MISCELLANEOUS) ×3 IMPLANT
COVER MAYO STAND STRL (DRAPES) IMPLANT
COVER SURGICAL LIGHT HANDLE (MISCELLANEOUS) ×3 IMPLANT
DRAIN CHANNEL 19F RND (DRAIN) ×3 IMPLANT
DRAPE LAPAROSCOPIC ABDOMINAL (DRAPES) ×3 IMPLANT
DRAPE PROXIMA HALF (DRAPES) IMPLANT
DRAPE UTILITY XL STRL (DRAPES) ×6 IMPLANT
DRAPE WARM FLUID 44X44 (DRAPE) ×3 IMPLANT
DRSG OPSITE POSTOP 4X10 (GAUZE/BANDAGES/DRESSINGS) IMPLANT
DRSG OPSITE POSTOP 4X8 (GAUZE/BANDAGES/DRESSINGS) IMPLANT
DRSG PAD ABDOMINAL 8X10 ST (GAUZE/BANDAGES/DRESSINGS) ×6 IMPLANT
ELECT BLADE 6.5 EXT (BLADE) IMPLANT
ELECT CAUTERY BLADE 6.4 (BLADE) ×3 IMPLANT
ELECT REM PT RETURN 9FT ADLT (ELECTROSURGICAL) ×3
ELECTRODE REM PT RTRN 9FT ADLT (ELECTROSURGICAL) ×1 IMPLANT
EVACUATOR SILICONE 100CC (DRAIN) ×3 IMPLANT
GLOVE BIOGEL PI IND STRL 6.5 (GLOVE) ×2 IMPLANT
GLOVE BIOGEL PI INDICATOR 6.5 (GLOVE) ×4
GLOVE SURG SIGNA 7.5 PF LTX (GLOVE) ×3 IMPLANT
GOWN L4 LG 24 PK N/S (GOWN DISPOSABLE) ×6 IMPLANT
GOWN STRL REUS W/ TWL LRG LVL3 (GOWN DISPOSABLE) ×2 IMPLANT
GOWN STRL REUS W/ TWL XL LVL3 (GOWN DISPOSABLE) ×3 IMPLANT
GOWN STRL REUS W/TWL LRG LVL3 (GOWN DISPOSABLE) ×4
GOWN STRL REUS W/TWL XL LVL3 (GOWN DISPOSABLE) ×6
KIT BASIN OR (CUSTOM PROCEDURE TRAY) ×3 IMPLANT
KIT OSTOMY DRAINABLE 2.75 STR (WOUND CARE) ×3 IMPLANT
KIT ROOM TURNOVER OR (KITS) ×3 IMPLANT
LIGASURE IMPACT 36 18CM CVD LR (INSTRUMENTS) ×3 IMPLANT
NS IRRIG 1000ML POUR BTL (IV SOLUTION) ×6 IMPLANT
PACK GENERAL/GYN (CUSTOM PROCEDURE TRAY) ×3 IMPLANT
PAD ARMBOARD 7.5X6 YLW CONV (MISCELLANEOUS) ×3 IMPLANT
PENCIL BUTTON HOLSTER BLD 10FT (ELECTRODE) IMPLANT
RELOAD PROXIMATE 75MM BLUE (ENDOMECHANICALS) ×3 IMPLANT
SPECIMEN JAR LARGE (MISCELLANEOUS) IMPLANT
SPONGE LAP 18X18 X RAY DECT (DISPOSABLE) ×3 IMPLANT
STAPLER PROXIMATE 75MM BLUE (STAPLE) ×3 IMPLANT
STAPLER VISISTAT 35W (STAPLE) ×3 IMPLANT
SUCTION POOLE TIP (SUCTIONS) ×3 IMPLANT
SUT PDS AB 1 TP1 96 (SUTURE) ×6 IMPLANT
SUT PROLENE 2 0 CT2 30 (SUTURE) ×3 IMPLANT
SUT SILK 2 0 SH CR/8 (SUTURE) ×3 IMPLANT
SUT SILK 2 0 TIES 10X30 (SUTURE) ×3 IMPLANT
SUT SILK 3 0 SH CR/8 (SUTURE) ×3 IMPLANT
SUT SILK 3 0 TIES 10X30 (SUTURE) ×3 IMPLANT
SUT VIC AB 3-0 SH 18 (SUTURE) ×6 IMPLANT
TOWEL OR 17X24 6PK STRL BLUE (TOWEL DISPOSABLE) ×3 IMPLANT
TOWEL OR 17X26 10 PK STRL BLUE (TOWEL DISPOSABLE) ×3 IMPLANT
TRAY FOLEY CATH 16FRSI W/METER (SET/KITS/TRAYS/PACK) IMPLANT
TUBE CONNECTING 12'X1/4 (SUCTIONS)
TUBE CONNECTING 12X1/4 (SUCTIONS) IMPLANT
YANKAUER SUCT BULB TIP NO VENT (SUCTIONS) IMPLANT

## 2014-08-05 NOTE — Transfer of Care (Signed)
Immediate Anesthesia Transfer of Care Note  Patient: [name]  Procedure(s) Performed: Procedure(s): EXPLORATORY LAPAROTOMY WITH SIGMOIDOTOMY WITH COLECTOMY & COLOSTOMY (N/A)  Patient Location: SICU  Anesthesia Type:General  Level of Consciousness: sedated, unresponsive and Patient remains intubated per anesthesia plan  Airway & Oxygen Therapy: Patient remains intubated per anesthesia plan and Patient placed on Ventilator (see vital sign flow sheet for setting)  Post-op Assessment: Report given to RN and Post -op Vital signs reviewed and stable  Post vital signs: Reviewed and stable  Last Vitals:  Filed Vitals:   08/05/14 1045  BP: 100/57  Pulse: 102  Temp:   Resp: 22    Complications: No apparent anesthesia complications

## 2014-08-05 NOTE — Progress Notes (Signed)
PULMONARY / CRITICAL CARE MEDICINE   Name: [name] MRN: [id number] DOB: [birth year]    ADMISSION DATE:  08/04/2014 CONSULTATION DATE:  08/04/14  REFERRING MD :  Dr. Redmond Pulling (Surgery)   CHIEF COMPLAINT:  Abdominal pain  INITIAL PRESENTATION: Pneumoperitoneum and acute cardiac ischemia  STUDIES:  ECG 7/22 ST seg depressions lead I,II,V4,V5,V6 CT abd/pelvis 7/22 >> pneumoperitoneum, likely perforated mid sigmoid diverticulum, hepatic steatosis, ventral hernia,     HISTORY OF PRESENT ILLNESS:    59 y.o. 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 Alvy Bimler) who presented to the the [facility] 7/20 for infusion therapy of cetuximab (first infusion 7/20). He was pre-medicated with benadryl but unfortunately suffered a cardiac arrest after initiation of therapy. The patient required brief CPR with 2 rounds of epinephrine with ROSC. He was intubated by anesthesia with notes of an abnormal upper airway but able to pass ETT without difficulty.  He was successfully extubated and discharged today approx 10am.  After d/c the patient states he was feeling well.  Went home, got up and ambulated to the bathroom when he noted as severe pain in his abdomen diffusely.  He states the pain continued to worsen he has had pain with inspiration and exhalation.  In the ED he was found to have pneumoperitoneum and peritoneal signs.  He was seen by surgery and extensive discussions with family occurred.  INTERVAL EVENTS:  Pt moved to [facility] given the fact that he has high surgical risk, had some evidence for cardiac ischemia at [facility]  Has had some hypotension overnight, received bolus 500cc x 1   VITAL SIGNS: Temp:  [98 F  (36.7 C)-99.2 F (37.3 C)] 99.2 F (37.3 C) (07/23 0400) Pulse Rate:  [87-108] 100 (07/23 0815) Resp:  [16-34] 23 (07/23 0815) BP: (82-133)/(50-71) 91/55 mmHg (07/23 0815) SpO2:  [92 %-99 %] 96 % (07/23 0815) Weight:  [86 kg (189 lb 9.5 oz)-92.2 kg (203 lb 4.2 oz)] 86 kg (189 lb 9.5 oz) (07/23 0400) HEMODYNAMICS:   N/A   VENTILATOR SETTINGS: N/A   INTAKE / OUTPUT: NPO/oliguric  Intake/Output Summary (Last 24 hours) at 08/05/14 0824 Last data filed at 08/05/14 0800  Gross per 24 hour  Intake   1210 ml  Output   1000 ml  Net    210 ml    PHYSICAL EXAMINATION: General:  Mild distress Neuro:  AAOx3, CN II-XII intact.no focal deficits. HEENT:  NCAT, hoarse voice.  Mallampati IV.  Cardiovascular:  RRR, S1/2 no m/r/g Lungs:  CTA b/l no w/r/r Abdomen:  + Diffusely tender worst LLQ, distended, hypoactive bowel sounds, + tympany Musculoskeletal:  Grossly normal Skin:  Scattered ecchymosis. B/l mottled appearance of the flanks. Ext: no c/c/e. 2+ pulses in all distal extremities  LABS:  CBC  Recent Labs Lab 08/04/14 0353 08/04/14 1950 08/05/14 0115  WBC 15.8* 17.4* 10.1  HGB 8.8* 10.1* 10.4*  HCT 26.7* 30.6* 31.6*  PLT 297 312 283   Coag's No results for input(s): APTT, INR in the last 168 hours. BMET  Recent Labs Lab 08/04/14 0353 08/04/14 1950 08/05/14 0115  NA 138 138 136  K 4.1 4.1 4.1  CL 106 103 103  CO2 26 24 26   BUN 19 19 18  CREATININE 0.95 1.05 1.07  GLUCOSE 144* 109* 98   Electrolytes  Recent Labs Lab 08/01/14 1223  08/04/14 0353 08/04/14 1950 08/05/14 0115  CALCIUM 9.3  < > 8.4* 8.6* 8.2*  MG 2.3  --   --   --  1.9  PHOS  --   --   --   --  2.4*  < > = values in this interval not displayed. Sepsis Markers  Recent Labs Lab 08/02/14 1014 08/05/14 0115 08/05/14 0543  LATICACIDVEN 4.64* 3.2* 2.08*   ABG  Recent Labs Lab 08/02/14 1020  PHART 7.304*  PCO2ART 42.1  PO2ART 465*   Liver Enzymes  Recent Labs Lab 08/01/14 1223  08/04/14 1950 08/05/14 0115  AST 26 40 38  ALT 25 43 41  ALKPHOS 118 73 71  BILITOT 0.45 0.2* 0.4  ALBUMIN 3.5 3.3* 3.1*   Cardiac Enzymes  Recent Labs Lab 08/05/14 0115  TROPONINI 1.54*   Glucose No results for input(s): GLUCAP in the last 168 hours.  Imaging Ct Angio Chest Aorta W/cm &/or Wo/cm  08/04/2014   CLINICAL DATA:  Current history of laryngeal carcinoma. Patient discharged from the hospital earlier today after a brief cardiac arrest felt to be related to use of Cetuximab, also with no urine output since 0830 hr earlier today and no bowel movement in 2 days. She presents with abdominal pain and distention.  EXAM: CT ANGIOGRAPHY CHEST, ABDOMEN AND PELVIS  TECHNIQUE: Multidetector CT imaging through the chest, abdomen and pelvis was performed using the standard protocol before and during bolus administration of intravenous contrast. Multiplanar reconstructed images and MIPs were obtained and reviewed to evaluate the vascular anatomy.  CONTRAST:  147mL OMNIPAQUE IOHEXOL 350 MG/ML IV. Oral contrast was also administered.  COMPARISON:  PET-CT 07/27/2014.  FINDINGS: CTA CHEST FINDINGS  Unenhanced images demonstrate no evidence of mural hematoma. Stent at the origin of the left subclavian artery is again noted.  Contrast enhancement of the thoracic aorta is excellent. No evidence of thoracic aortic dissection or aneurysm. Moderate atherosclerosis. Prior CABG, and the coronary graft to a diagonal branch of the LAD does opacify. A stent is present within the coronary graft. Central pulmonary arteries are patent. 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 The left subclavian artery stent is patent. Calcified and noncalcified plaque are present in the proximal innominate artery with mild stenosis. Calcified and noncalcified plaque are present at the origin of the right vertebral artery with moderate stenosis.  Heart mildly enlarged.  No pericardial effusion.   Emphysematous changes throughout both lungs. Dependent atelectasis posteriorly in the lower lobes. Small bilateral pleural effusions. Lungs otherwise clear without confluent airspace consolidation, interstitial disease, or parenchymal nodules. Central airways patent with moderate central peribronchial thickening.  No significant mediastinal, hilar or axillary lymphadenopathy. Thyroid gland normal in appearance. Right jugular Port-A-Cath tip at the cavoatrial junction. Bone window images demonstrate osseous demineralization, mild diffuse thoracic spondylosis, multiple Schmorl's nodes, but no evidence of osseous metastatic disease.  Review of the MIP images confirms the above findings.  CTA ABDOMEN AND PELVIS FINDINGS  No evidence of abdominal aortic aneurysm or dissection. Severe atherosclerosis involving the abdominal aorta with calcified and noncalcified plaque. High-grade stenosis at the origin of the celiac artery. Mild stenosis at the origin of the SMA. High-grade stenosis at the origin of the IMA. High-grade stenoses at the origins of the renal arteries bilaterally. Severe atherosclerosis involving the iliofemoral arteries, with high-grade stenosis suspected in the left common iliac  artery and moderate stenosis in the right common iliac artery.  Pneumoperitoneum, with free air scattered throughout the abdomen and pelvis. A midline supraumbilical abdominal wall hernia is present and there are gas bubbles in the subcutaneous fat of the abdominal wall at the level of the hernia. There are acute inflammatory changes in the right upper pelvis, where there are distal ileal loops and the mid sigmoid colon. I believe the sigmoid colon is the site of the perforation, as there is asymmetric wall thickening along its right lateral wall, and there is gas within the bowel wall. There are scattered diverticula involving the mid sigmoid colon in this region. There is no evidence of bowel obstruction. A small amount of ascites  is present in the pelvis. A normal appendix is identified in the right mid abdomen.  Diffuse hepatic steatosis without focal hepatic parenchymal abnormality. Normal appearing spleen, pancreas, right adrenal gland, and kidneys. Approximate 2.3 cm nodule arising from the left adrenal gland which was not hypermetabolic on the prior PET-CT. No significant lymphadenopathy involving the abdomen or pelvis. Small hiatal hernia; stomach otherwise normal in appearance. Normal appearing urinary bladder. Prostate gland and seminal vesicles normal for age. Small left inguinal hernia containing fat.  Bone window images demonstrate degenerative changes involving the facet joints of the lower lumbar spine, degenerative changes involving the right sacroiliac joint, but no evidence of osseous metastatic disease.  Review of the MIP images confirms the above findings.  IMPRESSION: 1. Pneumoperitoneum. The origin of the free intraperitoneal air is felt to be perforated diverticulitis involving the mid sigmoid colon. 2. No evidence of thoracic or abdominal aortic dissection. 3. Generalized atherosclerosis as detailed above. 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 4. COPD/emphysema. Small bilateral pleural effusions and mild bilateral lower lobe atelectasis. No acute cardiopulmonary disease otherwise. 5. Diffuse hepatic steatosis. 6. Left adrenal adenoma. 7. Small hiatal hernia. 8. Small left inguinal hernia containing fat. 9. Midline supraumbilical abdominal wall hernia. Critical Value/emergent results were called by telephone at the time of interpretation on 08/04/2014 at 9:29 pm to Dr. Pattricia Boss, who verbally acknowledged these results.   Electronically Signed   By: Evangeline Dakin M.D.   On: 08/04/2014 21:34   Ct Angio Abd/pel W/ And/or W/o  08/04/2014   CLINICAL DATA:  Current history of laryngeal carcinoma. Patient discharged from the hospital earlier today after a brief cardiac arrest felt to be  related to use of Cetuximab, also with no urine output since 0830 hr earlier today and no bowel movement in 2 days. She presents with abdominal pain and distention.  EXAM: CT ANGIOGRAPHY CHEST, ABDOMEN AND PELVIS  TECHNIQUE: Multidetector CT imaging through the chest, abdomen and pelvis was performed using the standard protocol before and during bolus administration of intravenous contrast. Multiplanar reconstructed images and MIPs were obtained and reviewed to evaluate the vascular anatomy.  CONTRAST:  176mL OMNIPAQUE IOHEXOL 350 MG/ML IV. Oral contrast was also administered.  COMPARISON:  PET-CT 07/27/2014.  FINDINGS: CTA CHEST FINDINGS  Unenhanced images demonstrate no evidence of mural hematoma. Stent at the origin of the left subclavian artery is again noted.  Contrast enhancement of the thoracic aorta is excellent. No evidence of thoracic aortic dissection or aneurysm. Moderate atherosclerosis. Prior CABG, and the coronary graft to a diagonal branch of the LAD does opacify. A stent is present within the coronary graft. Central pulmonary arteries are patent. 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 The left  subclavian artery stent is patent. Calcified and noncalcified plaque are present in the proximal innominate artery with mild stenosis. Calcified and noncalcified plaque are present at the origin of the right vertebral artery with moderate stenosis.  Heart mildly enlarged.  No pericardial effusion.  Emphysematous changes throughout both lungs. Dependent atelectasis posteriorly in the lower lobes. Small bilateral pleural effusions. Lungs otherwise clear without confluent airspace consolidation, interstitial disease, or parenchymal nodules. Central airways patent with moderate central peribronchial thickening.  No significant mediastinal, hilar or axillary lymphadenopathy. Thyroid gland normal in appearance. Right jugular Port-A-Cath tip at the cavoatrial junction. Bone window images  demonstrate osseous demineralization, mild diffuse thoracic spondylosis, multiple Schmorl's nodes, but no evidence of osseous metastatic disease.  Review of the MIP images confirms the above findings.  CTA ABDOMEN AND PELVIS FINDINGS  No evidence of abdominal aortic aneurysm or dissection. Severe atherosclerosis involving the abdominal aorta with calcified and noncalcified plaque. High-grade stenosis at the origin of the celiac artery. Mild stenosis at the origin of the SMA. High-grade stenosis at the origin of the IMA. High-grade stenoses at the origins of the renal arteries bilaterally. Severe atherosclerosis involving the iliofemoral arteries, with high-grade stenosis suspected in the left common iliac artery and moderate stenosis in the right common iliac artery.  Pneumoperitoneum, with free air scattered throughout the abdomen and pelvis. A midline supraumbilical abdominal wall hernia is present and there are gas bubbles in the subcutaneous fat of the abdominal wall at the level of the hernia. There are acute inflammatory changes in the right upper pelvis, where there are distal ileal loops and the mid sigmoid colon. I believe the sigmoid colon is the site of the perforation, as there is asymmetric wall thickening along its right lateral wall, and there is gas within the bowel wall. There are scattered diverticula involving the mid sigmoid colon in this region. There is no evidence of bowel obstruction. A small amount of ascites is present in the pelvis. A normal appendix is identified in the right mid abdomen.  Diffuse hepatic steatosis without focal hepatic parenchymal abnormality. Normal appearing spleen, pancreas, right adrenal gland, and kidneys. Approximate 2.3 cm nodule arising from the left adrenal gland which was not hypermetabolic on the prior PET-CT. No significant lymphadenopathy involving the abdomen or pelvis. Small hiatal hernia; stomach otherwise normal in appearance. Normal appearing urinary  bladder. Prostate gland and seminal vesicles normal for age. Small left inguinal hernia containing fat.  Bone window images demonstrate degenerative changes involving the facet joints of the lower lumbar spine, degenerative changes involving the right sacroiliac joint, but no evidence of osseous metastatic disease.  Review of the MIP images confirms the above findings.  IMPRESSION: 1. Pneumoperitoneum. The origin of the free intraperitoneal air is felt to be perforated diverticulitis involving the mid sigmoid colon. 2. No evidence of thoracic or abdominal aortic dissection. 3. Generalized atherosclerosis as detailed above. 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 4. COPD/emphysema. Small bilateral pleural effusions and mild bilateral lower lobe atelectasis. No acute cardiopulmonary disease otherwise. 5. Diffuse hepatic steatosis. 6. Left adrenal adenoma. 7. Small hiatal hernia. 8. Small left inguinal hernia containing fat. 9. Midline supraumbilical abdominal wall hernia. Critical Value/emergent results were called by telephone at the time of interpretation on 08/04/2014 at 9:29 pm to Dr. Pattricia Boss, who verbally acknowledged these results.   Electronically Signed   By: Evangeline Dakin M.D.   On: 08/04/2014 21:34     ASSESSMENT / PLAN:  PULMONARY:  A:CT with e/o emphysematous changes.  COPD without evidence active exacerbation  P:   - Maintain O2 saturation >90% - Outpatient PFTs once stabilized   CARDIOVASCULAR A: Acute ST seg depressions with elevated troponin in the absence of chest pain.  Suspect demand ischemia/Type II MI.  At this time he his a very high risk from a cardiovascular standpoint for his surgery, however, given the acuity his abdominal intervention takes precedent.  High risk septic shock given abd perforation, will need to support with volume P:  - Continue home carvedilol pre-op 12.5mg  PO BID, hold for hypotension 7/23 am - per cards, no role for acute  intervention given his abdominal acuity. May be a candidate for an intervention once anticoagulation is possible.  - trend troponin for peak  RENAL A:  Oliguria High risk acute renal failure P:   - volume resucitation - treatment of acute abdomen  - FeNa/FeUrea  GASTROINTESTINAL A:  Bowel perforation, with free air in abdomen.   P:   - NGT in place - NPO - Abx as below - appreciate CCS evaluation. Pt requested conservative management through the night with plan to reassess for surgery am 7/23. This will also allowed him to be moved to [facility] for possible cardiac intervention should it become necessary - D5 with NS @125cc /hr x8hrs  HEMATOLOGIC A:  Leukocytosis.  P:  - see ID section  INFECTIOUS A:  Peritonitis Currently with bowel perforation.   Sepsis, at risk septic shock P:   BCx2 08/04/2014 UC 08/04/2014  Abx: Vanc/Zosyn, start date7/22/2016, day 2 of X  ETOH ABUSE DISORDER A: Pt reports drinking 5-6 beers every day.  He denies ever having withdrawal symptoms or seizure activity. P:  - CIWA instituted now - d/c after 24 hours without benzo requirement  FAMILY  - Updates: Discussed with patient and wife on 7/23    Independent CC time 59 minutes    Baltazar Apo, MD, PhD 08/05/2014, 8:37 AM [facility] [phone] or if no answer [phone]

## 2014-08-05 NOTE — Progress Notes (Addendum)
Dr Diona Fanti updated me that he spoke again with cardiology at my request to let them know pt would not be having surgery tonight per pt request and that I kindly requested them see pt this evening to leave note on pt's chart. He informed me that after speaking with cardiology they requested/asked if pt could be transferred to [facility] so that in the event of acute intraop/postop event he could have emergent cath. I had no objection. Pt is stable for tx to [facility].   i alerted my on-call partner about pt.   A different surgeon from [facility] will be seeing pt later this am and reassess him at that time for potential surgery. Surgery and timing of surgery will be at his discretion.   We will be a consulting service. We are not the admitting or attending service given his complex medical situation  Leighton Ruff. Redmond Pulling, MD, FACS General, Bariatric, & Minimally Invasive Surgery [facility], [place]

## 2014-08-05 NOTE — Progress Notes (Signed)
Pt arrives back from OR at this time and placed on full vent support.  Placed pt on 37ml VT, pt tolerating vent settings well, RT will continue to monitor.

## 2014-08-05 NOTE — Progress Notes (Signed)
Patient ID: [name], male   DOB: 01-16-1955, 59 y.o.   MRN: [id number]  He remains critically ill with hypotension and abdominal pain He is diffusely tender with peritonitis of the abdomen on exam His family is present . He is now agreeing to surgery  I discussed the risks which include but are not limited to bleeding, infection, injury to surrounding structures, need for bowel resection, need for ostomy, possible g-tube, cardiopulmonary issues, prolonged intubation and even death.  Again, he is critically ill but understands the risks and agrees to proceed.  Surgery is scheduled urgently.

## 2014-08-05 NOTE — Progress Notes (Signed)
CRITICAL VALUE ALERT  Critical value received:  Lactic acid-2.08  Date of notification:  08/05/2014  Time of notification:  5:46 AM  Critical value read back:Yes.    Nurse who received alert:  Dillard Essex  MD notified (1st page):  Dr. Jimmy Footman  Time of first page:  5:46 AM  MD notified (2nd page):  Time of second page:  Responding MD:  Dr. Jimmy Footman  Time MD responded:  5:46 AM

## 2014-08-05 NOTE — Progress Notes (Signed)
eLink Physician-Brief Progress Note Patient Name: [name] DOB: 1955/10/27 MRN: [id number]   Date of Service  08/05/2014  HPI/Events of Note  1. Patient has not been able to void. 2. Hypotension with BP of 85/55 (62)  eICU Interventions  Plan: Insert foley prior to surgery 500 cc NS bolus for hypotension     Intervention Category Intermediate Interventions: Oliguria - evaluation and management;Hypotension - evaluation and management  DETERDING,ELIZABETH 08/05/2014, 5:18 AM

## 2014-08-05 NOTE — Progress Notes (Signed)
ANTIBIOTIC CONSULT NOTE - INITIAL  Pharmacy Consult for Vancomycin Indication: Intra-abdominal infection  Allergies  Allergen Reactions  . Cetuximab Anaphylaxis    Cardiac arrest 08/02/2014    Patient Measurements:   Adjusted Body Weight:   Vital Signs: Temp: 98 F (36.7 C) (07/22 2032) Temp Source: Oral (07/22 2032) BP: 112/64 mmHg (07/23 0030) Pulse Rate: 98 (07/23 0030) Intake/Output from previous day: 07/22 0701 - 07/23 0700 In: -  Out: 400 [Urine:400] Intake/Output from this shift: Total I/O In: -  Out: 400 [Urine:400]  Labs:  Recent Labs  08/03/14 0600 08/04/14 0353 08/04/14 1950  WBC 14.9* 15.8* 17.4*  HGB 10.6* 8.8* 10.1*  PLT 327 297 312  CREATININE 1.06 0.95 1.05   Estimated Creatinine Clearance: 76.6 mL/min (by C-G formula based on Cr of 1.05). No results for input(s): VANCOTROUGH, VANCOPEAK, VANCORANDOM, GENTTROUGH, GENTPEAK, GENTRANDOM, TOBRATROUGH, TOBRAPEAK, TOBRARND, AMIKACINPEAK, AMIKACINTROU, AMIKACIN in the last 72 hours.   Microbiology: Recent Results (from the past 720 hour(s))  MRSA PCR Screening     Status: None   Collection Time: 08/02/14 11:24 AM  Result Value Ref Range Status   MRSA by PCR NEGATIVE NEGATIVE Final    Comment:        The GeneXpert MRSA Assay (FDA approved for NASAL specimens only), is one component of a comprehensive MRSA colonization surveillance program. It is not intended to diagnose MRSA infection nor to guide or monitor treatment for MRSA infections.     Medical History: Past Medical History  Diagnosis Date  . Carotid stenosis   . Coronary artery disease     s/p CABG 2006; stenting to vein graft 05-2013; NSTEMI 06-2013 in setting of PEA arrest  . Subclavian artery stenosis, left     s/p stenting 2006  . Hypertension   . Hyperlipidemia   . Ischemic cardiomyopathy     echo 06/2013 EF 10-15% (normal 01/2013)  . At risk for sudden cardiac death, has lifevest at discharge 06/20/2013  . Shock liver 06/20/2013   . Acute MI, troponin > 20, no obvious culprit vessel 06/20/2013  . Hypothermia, induced, post arrest 06/20/2013  . Acute encephalopathy, improved 06/20/2013  . Dizziness 06/20/2013  . Weakness due to cardiac arrest 06/20/2013  . Fever, possible aspiration-treated with antibiotics 06/20/2013  . Tobacco abuse     >100 pack year history   . Dyslipidemia 10/09/2013    [place] Study atorvastatin -eIRB # [id number] tablet Take 40 mg by mouth daily.   Marland Kitchen PAD (peripheral artery disease) 10/09/2013    Occluded left common carotid and moderate right internal carotid artery stenosis. Previous stent to the left subclavian artery. Right iliac stent. Bilateral 60-99% renal artery stenosis    . Heart failure, acute on chronic, systolic and diastolic 14/48/1856  . Anxiety   . GERD (gastroesophageal reflux disease)   . 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    Medications:  Anti-infectives    Start     Dose/Rate Route Frequency Ordered Stop   08/05/14 1000  vancomycin (VANCOCIN) IVPB 1000 mg/200 mL premix     1,000 mg 200 mL/hr over 60 Minutes Intravenous Every 12 hours 08/05/14 0102     08/05/14 0600  piperacillin-tazobactam (ZOSYN) IVPB 3.375 g     3.375 g 12.5 mL/hr over 240 Minutes Intravenous 3 times per day 08/05/14 0054     08/05/14 0045  vancomycin (VANCOCIN) IVPB 1000 mg/200 mL premix  Status:  Discontinued     1,000 mg 200 mL/hr over 60 Minutes Intravenous Every 12  hours 08/05/14 0038 08/05/14 0053   08/05/14 0045  piperacillin-tazobactam (ZOSYN) IVPB 3.375 g  Status:  Discontinued     3.375 g 100 mL/hr over 30 Minutes Intravenous 3 times per day 08/05/14 0038 08/05/14 0053   08/04/14 2145  piperacillin-tazobactam (ZOSYN) IVPB 3.375 g     3.375 g 12.5 mL/hr over 240 Minutes Intravenous  Once 08/04/14 2142     08/04/14 2145  vancomycin (VANCOCIN) IVPB 1000 mg/200 mL premix     1,000 mg 200 mL/hr over 60 Minutes Intravenous  Once 08/04/14 2142 08/05/14 0011     Assessment: Patient with invasive  squamous cell carcinoma of the larynx started cetuximab but required CPR (7/20), patient d/c home 7/22.  Patient then reports abdominal pain, ED reports pneumoperitoneum and peritoneal signs.  Abx for possible bowel perforation and leukocystosis.    Goal of Therapy:  Vancomycin trough level 15-20 mcg/ml  Plan:  Measure antibiotic drug levels at steady state Follow up culture results Vancomycin 1gm iv q12hr  Nani Skillern Crowford 08/05/2014,1:06 AM

## 2014-08-05 NOTE — Op Note (Addendum)
EXPLORATORY LAPAROTOMY WITH SIGMOID COLECTOMY AND  COLOSTOMY  Procedure Note  KYLIL SWOPES 08/04/2014 - 08/05/2014   Pre-op Diagnosis: PERFORATED BOWEL      Post-op Diagnosis: PERFORATED SIGMOID DIVERTICULITIS  Procedure(s): EXPLORATORY LAPAROTOMY WITH SIGMOID COLECTOMY & COLOSTOMY  Surgeon(s): Coralie Keens, MD  Anesthesia: General  Staff:  Circulator: Elige Radon, RN Scrub Person: Westley Gambles, NT  Estimated Blood Loss: Minimal               Specimens: SENT TO PATH          Pericles Carmicheal A   Date: 08/05/2014  Time: 1:03 PM

## 2014-08-05 NOTE — Plan of Care (Signed)
Problem: Phase I Progression Outcomes Goal: Pain controlled with appropriate interventions Outcome: Progressing Patient on fentanyl gtt    Goal: Tubes/drains patent Outcome: Progressing jp draining well.

## 2014-08-05 NOTE — Anesthesia Preprocedure Evaluation (Addendum)
Anesthesia Evaluation  Patient identified by MRN, date of birth, ID band Patient awake    Reviewed: Allergy & Precautions, NPO status , Patient's Chart, lab work & pertinent test results, reviewed documented beta blocker date and time   History of Anesthesia Complications (+) DIFFICULT AIRWAY and history of anesthetic complications (laryngeal cancer)  Airway Mallampati: II  TM Distance: >3 FB Neck ROM: Limited    Dental  (+) Dental Advisory Given   Pulmonary former smoker,  breath sounds clear to auscultation        Cardiovascular hypertension, Pt. on medications and Pt. on home beta blockers + CAD, + Past MI, + Cardiac Stents, + CABG, + Peripheral Vascular Disease (iliac artery stent, subclavian stenosis) and +CHF + dysrhythmias Ventricular Tachycardia Rhythm:Regular Rate:Normal  Recent MI No sign of new blockages on 07/04/14 myoview  '15 ECHO: EF 45% to 50%, Hypokinesis of the basal and mid anterior, anteroseptal, apical septal walls. Valves OK   Neuro/Psych negative neurological ROS     GI/Hepatic GERD-  ,(+) Hepatitis -Acute small bowel perf/free air with copious drainage from NG tube   Endo/Other  negative endocrine ROS  Renal/GU negative Renal ROS     Musculoskeletal   Abdominal   Peds  Hematology  (+) Blood dyscrasia (Hb 10.4), ,   Anesthesia Other Findings   Reproductive/Obstetrics                          Anesthesia Physical Anesthesia Plan  ASA: IV and emergent  Anesthesia Plan: General   Post-op Pain Management:    Induction: Intravenous and Rapid sequence  Airway Management Planned: Oral ETT and Video Laryngoscope Planned  Additional Equipment: Arterial line  Intra-op Plan:   Post-operative Plan: Post-operative intubation/ventilation  Informed Consent: I have reviewed the patients History and Physical, chart, labs and discussed the procedure including the risks,  benefits and alternatives for the proposed anesthesia with the patient or authorized representative who has indicated his/her understanding and acceptance.   Dental advisory given and Consent reviewed with POA  Plan Discussed with: CRNA and Surgeon  Anesthesia Plan Comments: (Plan routine monitors, A line, GETA with VideoGlide intubation and post op ventilation.  Airway difficult in past, will use 6.0 regular ETT to give best chance of safe passage given aspiration risk combined with laryngeal cancer)        Anesthesia Quick Evaluation

## 2014-08-05 NOTE — Anesthesia Postprocedure Evaluation (Signed)
  Anesthesia Post-op Note  Patient: [name]  Procedure(s) Performed: Procedure(s): EXPLORATORY LAPAROTOMY WITH SIGMOIDOTOMY WITH COLECTOMY & COLOSTOMY (N/A)  Patient Location: SICU  Anesthesia Type:General  Level of Consciousness: sedated and Patient remains intubated per anesthesia plan  Airway and Oxygen Therapy: Patient remains intubated per anesthesia plan and Patient placed on Ventilator (see vital sign flow sheet for setting)  Post-op Pain: none  Post-op Assessment: Post-op Vital signs reviewed, Patient's Cardiovascular Status Stable, Respiratory Function Stable, Patent Airway, No signs of Nausea or vomiting and Pain level controlled              Post-op Vital Signs: Reviewed and stable  Last Vitals:  Filed Vitals:   08/05/14 1800  BP: 108/60  Pulse: 81  Temp:   Resp: 11    Complications: No apparent anesthesia complications

## 2014-08-05 NOTE — Anesthesia Procedure Notes (Signed)
Procedure Name: Intubation Date/Time: 08/05/2014 11:59 AM Performed by: Eligha Bridegroom Pre-anesthesia Checklist: Emergency Drugs available, Patient identified, Timeout performed, Suction available and Patient being monitored Patient Re-evaluated:Patient Re-evaluated prior to inductionOxygen Delivery Method: Circle system utilized Preoxygenation: Pre-oxygenation with 100% oxygen Intubation Type: IV induction and Rapid sequence Laryngoscope Size: Glidescope Grade View: Grade III Tube type: Oral Tube size: 6.0 mm Number of attempts: 1 Airway Equipment and Method: Stylet and Video-laryngoscopy Placement Confirmation: ETT inserted through vocal cords under direct vision,  breath sounds checked- equal and bilateral and positive ETCO2 Secured at: 21 cm Tube secured with: Tape Dental Injury: Teeth and Oropharynx as per pre-operative assessment

## 2014-08-05 NOTE — Op Note (Signed)
NAME:  EBER, FERRUFINO NO.:  [id number]  MEDICAL RECORD NO.:  [id number]  LOCATION:  [facility]                         FACILITY:  [facility]  PHYSICIAN:  Coralie Keens, M.D. DATE OF BIRTH:  [birth year]  DATE OF PROCEDURE:  08/05/2014 DATE OF DISCHARGE:                              OPERATIVE REPORT   PREOPERATIVE DIAGNOSIS:  Perforated viscus.  POSTOPERATIVE DIAGNOSIS:  Perforated sigmoid diverticulitis.  PROCEDURES: 1. Exploratory laparotomy. 2. Sigmoid colectomy with end colostomy (Hartmann's procedure).  SURGEON:  Coralie Keens, M.D.  ANESTHESIA:  General.  ESTIMATED BLOOD LOSS:  Minimal.  INDICATIONS:  This is a 59 year old gentleman, with laryngeal carcinoma, undergoing chemotherapy, who presented with acute abdominal pain, last evening.  He was found to have free air.  He refused surgery last night. He was transferred to [facility] and he was now agreeable for surgery today.  Decision made to proceed to the operating room for exploration.  FINDINGS:  The patient was found to have a perforated sigmoid diverticulitis, with a large amount of intraabdominal purulence.  No other intraabdominal pathology was identified.  PROCEDURE IN DETAIL:  The patient was brought up room, identified as Mercy Moore.  He was placed supine on the operating room table and general anesthesia was induced.  His abdomen was then prepped and draped in usual sterile fashion.  I created a midline incision with a scalpel. I took this down through the subcutaneous tissue to the fascia.  The fascia was then opened the entire length of the incision.  I then gained assistance to enter the peritoneal cavity and opened the peritoneum entirely.  The incision upon entering the abdomen, a large amount of purulent fluid was identified.  I eviscerated the small bowel.  I was able to easily identify the loop of sigmoid colon, which had acute perforation from sigmoid diverticulitis.  No other  intraabdominal pathology was identified.  At this point, I was able to mobilize the sigmoid colon along the white line of Toldt in front of the pelvic sidewall.  I then transected the sigmoid colon proximal to the area of perforation with a GIA 75 stapler.  I then took down the mesentery with the ligature.  I identified the sigmoid colon distal to the perforation, transected this with a GIA 75 stapler as well.  I then took down the rest of the mesentery with the ligature device.  The specimen was then sent to Pathology for evaluation.  I marked the distal colon with a 2-0 Prolene suture.  I then mobilized the proximal colon further along the white line of Toldt.  I then made a circular incision on the patient's left mid abdomen with cautery.  I took this down through the subcutaneous tissue with electrocautery.  I then identified the fashion, opened this in a cruciate fashion.  I then separated the underlying muscle fibers and opened the peritoneum.  I then pulled the descending colon out of this incision at the end colostomy.  I then copiously irrigated the abdomen with several liters of normal saline.  Hemostasis appeared to be achieved.  I made a separate incision in the patient's right lower quadrant and placed a 19-French Blake drain into the pelvis.  This was sewn in place with a silk suture.  I then closed the patient's midline fascia with a running #1 looped PDS suture.  The skin was then packed with wet-to-dry saline gauze.  I then removed the staple line at the colostomy site and matured the colostomy circumferentially with interrupted 3-0 Vicryl sutures.  I did secure the colostomy in 2 separate places with 3-0 silk sutures to the fascia prior to closing with 0 Vicryl.  The ostomy appeared pink and well perfused.  An ostomy appliance was then applied.  The patient tolerated the procedure fairly well.  A decision was made to leave him intubated and taken in a guarded condition  from the operating room to the recovery room.  Again all sponge, needle, and instrument counts were correct at the end of procedure.     Coralie Keens, M.D.     DB/MEDQ  D:  08/05/2014  T:  08/05/2014  Job:  [id number]

## 2014-08-05 NOTE — ED Notes (Signed)
Report given to Sabrina RN.

## 2014-08-06 ENCOUNTER — Inpatient Hospital Stay (HOSPITAL_COMMUNITY): Payer: Medicaid Other

## 2014-08-06 LAB — CBC
HCT: 29.4 % — ABNORMAL LOW (ref 39.0–52.0)
Hemoglobin: 9.6 g/dL — ABNORMAL LOW (ref 13.0–17.0)
MCH: 29.6 pg (ref 26.0–34.0)
MCHC: 32.7 g/dL (ref 30.0–36.0)
MCV: 90.7 fL (ref 78.0–100.0)
PLATELETS: 197 10*3/uL (ref 150–400)
RBC: 3.24 MIL/uL — ABNORMAL LOW (ref 4.22–5.81)
RDW: 16.2 % — ABNORMAL HIGH (ref 11.5–15.5)
WBC: 13.5 10*3/uL — ABNORMAL HIGH (ref 4.0–10.5)

## 2014-08-06 LAB — GLUCOSE, CAPILLARY
GLUCOSE-CAPILLARY: 97 mg/dL (ref 65–99)
GLUCOSE-CAPILLARY: 105 mg/dL — AB (ref 65–99)
GLUCOSE-CAPILLARY: 92 mg/dL (ref 65–99)
Glucose-Capillary: 101 mg/dL — ABNORMAL HIGH (ref 65–99)
Glucose-Capillary: 106 mg/dL — ABNORMAL HIGH (ref 65–99)
Glucose-Capillary: 83 mg/dL (ref 65–99)
Glucose-Capillary: 99 mg/dL (ref 65–99)

## 2014-08-06 LAB — BASIC METABOLIC PANEL
Anion gap: 5 (ref 5–15)
BUN: 10 mg/dL (ref 6–20)
CALCIUM: 7.5 mg/dL — AB (ref 8.9–10.3)
CO2: 26 mmol/L (ref 22–32)
Chloride: 104 mmol/L (ref 101–111)
Creatinine, Ser: 1.04 mg/dL (ref 0.61–1.24)
GFR calc Af Amer: >60 mL/min (ref >60)
GFR calc non Af Amer: >60 mL/min (ref >60)
Glucose, Bld: 113 mg/dL — ABNORMAL HIGH (ref 65–99)
POTASSIUM: 3.6 mmol/L (ref 3.5–5.1)
SODIUM: 135 mmol/L (ref 135–145)

## 2014-08-06 MED ORDER — CETYLPYRIDINIUM CHLORIDE 0.05 % MT LIQD
7.0000 mL | Freq: Two times a day (BID) | OROMUCOSAL | Status: DC
  Administered 2014-08-06 – 2014-08-11 (×10): 7 mL via OROMUCOSAL

## 2014-08-06 MED ORDER — CHLORHEXIDINE GLUCONATE 0.12 % MT SOLN
15.0000 mL | Freq: Two times a day (BID) | OROMUCOSAL | Status: DC
Start: 2014-08-06 — End: 2014-08-12
  Administered 2014-08-06 – 2014-08-12 (×12): 15 mL via OROMUCOSAL
  Filled 2014-08-06 (×12): qty 15

## 2014-08-06 NOTE — Progress Notes (Signed)
PULMONARY / CRITICAL CARE MEDICINE   Name: [name] MRN: [id number] DOB: [birth year]    ADMISSION DATE:  08/04/2014 CONSULTATION DATE:  08/04/14  REFERRING MD :  Dr. Redmond Pulling (Surgery)   CHIEF COMPLAINT:  Abdominal pain  INITIAL PRESENTATION: Pneumoperitoneum and acute cardiac ischemia  STUDIES:  ECG 7/22 ST seg depressions lead I,II,V4,V5,V6 CT abd/pelvis 7/22 >> pneumoperitoneum, likely perforated mid sigmoid diverticulum, hepatic steatosis, ventral hernia,     HISTORY OF PRESENT ILLNESS:    59 y.o. 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 Alvy Bimler) who presented to the the [facility] 7/20 for infusion therapy of cetuximab (first infusion 7/20). He was pre-medicated with benadryl but unfortunately suffered a cardiac arrest after initiation of therapy. The patient required brief CPR with 2 rounds of epinephrine with ROSC. He was intubated by anesthesia with notes of an abnormal upper airway but able to pass ETT without difficulty.  He was successfully extubated and discharged today approx 10am.  After d/c the patient states he was feeling well.  Went home, got up and ambulated to the bathroom when he noted as severe pain in his abdomen diffusely.  He states the pain continued to worsen he has had pain with inspiration and exhalation.  In the ED he was found to have pneumoperitoneum and peritoneal signs.  He was seen by surgery and extensive discussions with family occurred.  INTERVAL EVENTS:  Some SVT end of day 7/23 Wakes easily to voice and tolerating PSV (just started)   VITAL SIGNS: Temp:  [98.5 F (36.9 C)-100.5 F (38.1 C)] 99.7 F (37.6 C) (07/24 0000) Pulse Rate:  [70-104] 73  (07/24 0600) Resp:  [0-28] 9 (07/24 0600) BP: (81-120)/(47-69) 83/60 mmHg (07/24 0600) SpO2:  [94 %-100 %] 98 % (07/24 0600) Arterial Line BP: (87-131)/(45-62) 94/49 mmHg (07/24 0600) FiO2 (%):  [40 %-50 %] 40 % (07/24 0400) Weight:  [86.8 kg (191 lb 5.8 oz)] 86.8 kg (191 lb 5.8 oz) (07/24 0500) HEMODYNAMICS:   N/A   VENTILATOR SETTINGS: N/A Vent Mode:  [-] PRVC FiO2 (%):  [40 %-50 %] 40 % Set Rate:  [12 bmp-14 bmp] 14 bmp Vt Set:  [530 mL] 530 mL PEEP:  [5 cmH20] 5 cmH20 Plateau Pressure:  [6 cmH20-21 cmH20] 6 cmH20 INTAKE / OUTPUT: NPO/oliguric  Intake/Output Summary (Last 24 hours) at 08/06/14 0762 Last data filed at 08/06/14 0600  Gross per 24 hour  Intake 2887.83 ml  Output   2575 ml  Net 312.83 ml    PHYSICAL EXAMINATION: General:  No distress on MV Neuro:  AAOx3, CN II-XII intact.no focal deficits. HEENT:  NCAT, ETT in place Cardiovascular:  RRR, S1/2 no m/r/g Lungs:  CTA B Abdomen:  Slight distended, wound clean, ostomy on L, no BS heard Musculoskeletal:  Grossly normal Skin:  Scattered ecchymosis. B/l mottled appearance of the flanks. Ext: no c/c/e. 2+ pulses in all distal extremities  LABS:  CBC  Recent Labs Lab 08/04/14 1950 08/05/14 0115 08/05/14 1241 08/06/14 0418  WBC 17.4* 10.1  --  13.5*  HGB 10.1* 10.4* 8.2* 9.6*  HCT 30.6* 31.6* 24.0* 29.4*  PLT 312 283  --  197   Coag's No results for input(s): APTT, INR in the last 168  hours. BMET  Recent Labs Lab 08/04/14 1950 08/05/14 0115 08/05/14 1241 08/06/14 0418  NA 138 136 138 135  K 4.1 4.1 3.6 3.6  CL 103 103  --  104  CO2 24 26  --  26  BUN 19 18  --  10  CREATININE 1.05 1.07  --  1.04  GLUCOSE 109* 98  --  113*   Electrolytes  Recent Labs Lab 08/01/14 1223  08/04/14 1950 08/05/14 0115 08/06/14 0418  CALCIUM 9.3  < > 8.6* 8.2* 7.5*  MG 2.3  --   --  1.9  --   PHOS  --   --   --  2.4*  --   < > = values in this interval not displayed. Sepsis Markers  Recent Labs Lab  08/02/14 1014 08/05/14 0115 08/05/14 0543  LATICACIDVEN 4.64* 3.2* 2.08*   ABG  Recent Labs Lab 08/02/14 1020 08/05/14 1241 08/05/14 1533  PHART 7.304* 7.338* 7.375  PCO2ART 42.1 39.1 41.6  PO2ART 465* 318.0* 80.0   Liver Enzymes  Recent Labs Lab 08/01/14 1223 08/04/14 1950 08/05/14 0115  AST 26 40 38  ALT 25 43 41  ALKPHOS 118 73 71  BILITOT 0.45 0.2* 0.4  ALBUMIN 3.5 3.3* 3.1*   Cardiac Enzymes  Recent Labs Lab 08/05/14 0115 08/05/14 0800 08/05/14 1330  TROPONINI 1.54* 0.94* 0.60*   Glucose  Recent Labs Lab 08/05/14 0935 08/05/14 1603 08/05/14 2023 08/06/14 0006 08/06/14 0407  GLUCAP 97 105* 95 106* 97    Imaging Dg Chest Port 1 View  08/06/2014   CLINICAL DATA:  Endotracheal tube placement.  Initial encounter.  EXAM: PORTABLE CHEST - 1 VIEW  COMPARISON:  Chest radiograph performed 08/05/2014  FINDINGS: The patient's endotracheal tube is seen ending 4-5 cm above the carina. An enteric tube is noted extending below the diaphragm. A right-sided chest port is seen ending about the distal SVC.  Vascular congestion is noted. Right basilar airspace opacity may reflect atelectasis or possibly mild pneumonia. No definite pleural effusion or pneumothorax is seen.  The cardiomediastinal silhouette is borderline normal in size. The patient is status post median sternotomy. No acute osseous abnormalities are seen.  IMPRESSION: 1. Endotracheal tube seen ending 4-5 cm above the carina. 2. Vascular congestion noted. Right basilar airspace opacity may reflect atelectasis or possibly mild pneumonia.   Electronically Signed   By: Garald Balding M.D.   On: 08/06/2014 06:24   Dg Chest Port 1 View  08/05/2014   CLINICAL DATA:  Status post exploratory laparotomy for perforated viscus. Intubated patient.  EXAM: PORTABLE CHEST - 1 VIEW  COMPARISON:  CT chest and stress a CT chest 08/04/2014. Single view of the chest 08/03/2014.  FINDINGS: NG tube is in place with the tip in good  position in the stomach. Endotracheal tube is also seen with the tip just below the clavicular heads, well above the carina. Right IJ approach Port-A-Cath is noted. Lung volumes are low with basilar atelectasis. There small bilateral pleural effusions. No pneumothorax.  IMPRESSION: Support apparatus projects in good position.  Small bilateral pleural effusions and basilar atelectasis.   Electronically Signed   By: Inge Rise M.D.   On: 08/05/2014 14:36     ASSESSMENT / PLAN:  PULMONARY:  A:CT with e/o emphysematous changes.  COPD without evidence active exacerbation  VDRF post-op  P:   - transition to PSV this am, goal extubate if well tolerated - Outpatient PFTs once stabilized   CARDIOVASCULAR A: Acute ST seg  depressions with elevated troponin in the absence of chest pain 7/22.  Suspect demand ischemia/Type II MI.   High risk septic shock given abd perforation, will need to support with volume P:  - Hold home carvedilol  - per cards, no role for acute intervention given his abdominal acuity. May be a candidate for an intervention once anticoagulation is possible.   RENAL A:  Oliguria, improved High risk acute renal failure P:   - volume resucitation - follow UOP and BMP  GASTROINTESTINAL A:  Bowel perforation, s/p exp lap  P:   - NGT in place, leave in - NPO - Abx as below  HEMATOLOGIC A:  Leukocytosis.  P:  - see ID section  INFECTIOUS A:  Peritonitis, bowel perforation.   Sepsis, at risk septic shock P:   BCx2 08/04/2014 >>  UC 08/04/2014 >>   Abx: Vanc/Zosyn, start date7/22/2016, day 3 of X  ETOH ABUSE DISORDER A: Pt reports drinking 5-6 beers every day.  He denies ever having withdrawal symptoms or seizure activity. P:  - CIWA coverage once extubated - d/c after 24 hours without benzo requirement  FAMILY  - Updates: Discussed with patient and wife on 7/23    Independent CC time 52 minutes    Baltazar Apo, MD, PhD 08/06/2014, 7:12 AM [place]  [facility] [phone] or if no answer [phone]

## 2014-08-06 NOTE — Progress Notes (Signed)
Pt placed on PSV 5/5 by Dr. Lamonte Sakai per RN.

## 2014-08-06 NOTE — Progress Notes (Signed)
Cuff leak noted, RT to assess.

## 2014-08-06 NOTE — Progress Notes (Addendum)
Waste 50 mL expired fentanyl in sink with Lyla Son, RN.

## 2014-08-06 NOTE — Progress Notes (Signed)
eLink Physician-Brief Progress Note Patient Name: CHANNING YEAGER DOB: May 16, 1955 MRN: [id number]   Date of Service  08/06/2014  HPI/Events of Note  ETT out too far on exam  eICU Interventions  Asked RT to advance 3cm and retape and rechk CXR. May need to reintubate      Intervention Category Major Interventions: Other: (ETT out too far)  Asencion Noble 08/06/2014, 4:07 AM

## 2014-08-06 NOTE — Progress Notes (Signed)
1 Day Post-Op  Subjective: Patient comfortable - less pain than yesterday Awake, alert  Objective: Vital signs in last 24 hours: Temp:  [98.5 F (36.9 C)-100.5 F (38.1 C)] 99.1 F (37.3 C) (07/24 0808) Pulse Rate:  [65-104] 76 (07/24 0825) Resp:  [0-28] 14 (07/24 0825) BP: (83-120)/(47-69) 100/54 mmHg (07/24 0825) SpO2:  [95 %-100 %] 95 % (07/24 0825) Arterial Line BP: (87-131)/(45-62) 93/45 mmHg (07/24 0700) FiO2 (%):  [40 %-50 %] 40 % (07/24 0715) Weight:  [86.8 kg (191 lb 5.8 oz)] 86.8 kg (191 lb 5.8 oz) (07/24 0500) Last BM Date: 08/03/14  Intake/Output from previous day: 07/23 0701 - 07/24 0700 In: 2922.8 [I.V.:1692.8; Blood:335; NG/GT:120; IV Piggyback:775] Out: 2575 [Urine:1425; Emesis/NG output:600; Drains:490; Stool:30; Blood:30] Intake/Output this shift:    General appearance: alert, cooperative and no distress Resp: clear to auscultation bilaterally Cardio: regular rate and rhythm, S1, S2 normal, no murmur, click, rub or gallop GI: no bowel sounds; incisional tenderness Ostomy pink; some bloody drainage; wound clean and dry  Lab Results:   Recent Labs  08/05/14 0115 08/05/14 1241 08/06/14 0418  WBC 10.1  --  13.5*  HGB 10.4* 8.2* 9.6*  HCT 31.6* 24.0* 29.4*  PLT 283  --  197   BMET  Recent Labs  08/05/14 0115 08/05/14 1241 08/06/14 0418  NA 136 138 135  K 4.1 3.6 3.6  CL 103  --  104  CO2 26  --  26  GLUCOSE 98  --  113*  BUN 18  --  10  CREATININE 1.07  --  1.04  CALCIUM 8.2*  --  7.5*   PT/INR No results for input(s): LABPROT, INR in the last 72 hours. ABG  Recent Labs  08/05/14 1241 08/05/14 1533  PHART 7.338* 7.375  HCO3 20.9 24.3*    Studies/Results: Dg Chest Port 1 View  08/06/2014   CLINICAL DATA:  Endotracheal tube placement.  Initial encounter.  EXAM: PORTABLE CHEST - 1 VIEW  COMPARISON:  Chest radiograph performed 08/05/2014  FINDINGS: The patient's endotracheal tube is seen ending 4-5 cm above the carina. An enteric  tube is noted extending below the diaphragm. A right-sided chest port is seen ending about the distal SVC.  Vascular congestion is noted. Right basilar airspace opacity may reflect atelectasis or possibly mild pneumonia. No definite pleural effusion or pneumothorax is seen.  The cardiomediastinal silhouette is borderline normal in size. The patient is status post median sternotomy. No acute osseous abnormalities are seen.  IMPRESSION: 1. Endotracheal tube seen ending 4-5 cm above the carina. 2. Vascular congestion noted. Right basilar airspace opacity may reflect atelectasis or possibly mild pneumonia.   Electronically Signed   By: Garald Balding M.D.   On: 08/06/2014 06:24   Dg Chest Port 1 View  08/05/2014   CLINICAL DATA:  Status post exploratory laparotomy for perforated viscus. Intubated patient.  EXAM: PORTABLE CHEST - 1 VIEW  COMPARISON:  CT chest and stress a CT chest 08/04/2014. Single view of the chest 08/03/2014.  FINDINGS: NG tube is in place with the tip in good position in the stomach. Endotracheal tube is also seen with the tip just below the clavicular heads, well above the carina. Right IJ approach Port-A-Cath is noted. Lung volumes are low with basilar atelectasis. There small bilateral pleural effusions. No pneumothorax.  IMPRESSION: Support apparatus projects in good position.  Small bilateral pleural effusions and basilar atelectasis.   Electronically Signed   By: Inge Rise M.D.   On: 08/05/2014  14:36   Ct Angio Chest Aorta W/cm &/or Wo/cm  08/04/2014   CLINICAL DATA:  Current history of laryngeal carcinoma. Patient discharged from the hospital earlier today after a brief cardiac arrest felt to be related to use of Cetuximab, also with no urine output since 0830 hr earlier today and no bowel movement in 2 days. She presents with abdominal pain and distention.  EXAM: CT ANGIOGRAPHY CHEST, ABDOMEN AND PELVIS  TECHNIQUE: Multidetector CT imaging through the chest, abdomen and pelvis  was performed using the standard protocol before and during bolus administration of intravenous contrast. Multiplanar reconstructed images and MIPs were obtained and reviewed to evaluate the vascular anatomy.  CONTRAST:  163mL OMNIPAQUE IOHEXOL 350 MG/ML IV. Oral contrast was also administered.  COMPARISON:  PET-CT 07/27/2014.  FINDINGS: CTA CHEST FINDINGS  Unenhanced images demonstrate no evidence of mural hematoma. Stent at the origin of the left subclavian artery is again noted.  Contrast enhancement of the thoracic aorta is excellent. No evidence of thoracic aortic dissection or aneurysm. Moderate atherosclerosis. Prior CABG, and the coronary graft to a diagonal branch of the LAD does opacify. A stent is present within the coronary graft. Central pulmonary arteries are patent. 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 The left subclavian artery stent is patent. Calcified and noncalcified plaque are present in the proximal innominate artery with mild stenosis. Calcified and noncalcified plaque are present at the origin of the right vertebral artery with moderate stenosis.  Heart mildly enlarged.  No pericardial effusion.  Emphysematous changes throughout both lungs. Dependent atelectasis posteriorly in the lower lobes. Small bilateral pleural effusions. Lungs otherwise clear without confluent airspace consolidation, interstitial disease, or parenchymal nodules. Central airways patent with moderate central peribronchial thickening.  No significant mediastinal, hilar or axillary lymphadenopathy. Thyroid gland normal in appearance. Right jugular Port-A-Cath tip at the cavoatrial junction. Bone window images demonstrate osseous demineralization, mild diffuse thoracic spondylosis, multiple Schmorl's nodes, but no evidence of osseous metastatic disease.  Review of the MIP images confirms the above findings.  CTA ABDOMEN AND PELVIS FINDINGS  No evidence of abdominal aortic aneurysm or  dissection. Severe atherosclerosis involving the abdominal aorta with calcified and noncalcified plaque. High-grade stenosis at the origin of the celiac artery. Mild stenosis at the origin of the SMA. High-grade stenosis at the origin of the IMA. High-grade stenoses at the origins of the renal arteries bilaterally. Severe atherosclerosis involving the iliofemoral arteries, with high-grade stenosis suspected in the left common iliac artery and moderate stenosis in the right common iliac artery.  Pneumoperitoneum, with free air scattered throughout the abdomen and pelvis. A midline supraumbilical abdominal wall hernia is present and there are gas bubbles in the subcutaneous fat of the abdominal wall at the level of the hernia. There are acute inflammatory changes in the right upper pelvis, where there are distal ileal loops and the mid sigmoid colon. I believe the sigmoid colon is the site of the perforation, as there is asymmetric wall thickening along its right lateral wall, and there is gas within the bowel wall. There are scattered diverticula involving the mid sigmoid colon in this region. There is no evidence of bowel obstruction. A small amount of ascites is present in the pelvis. A normal appendix is identified in the right mid abdomen.  Diffuse hepatic steatosis without focal hepatic parenchymal abnormality. Normal appearing spleen, pancreas, right adrenal gland, and kidneys. Approximate 2.3 cm nodule arising from the left adrenal gland which was not hypermetabolic on  the prior PET-CT. No significant lymphadenopathy involving the abdomen or pelvis. Small hiatal hernia; stomach otherwise normal in appearance. Normal appearing urinary bladder. Prostate gland and seminal vesicles normal for age. Small left inguinal hernia containing fat.  Bone window images demonstrate degenerative changes involving the facet joints of the lower lumbar spine, degenerative changes involving the right sacroiliac joint, but no  evidence of osseous metastatic disease.  Review of the MIP images confirms the above findings.  IMPRESSION: 1. Pneumoperitoneum. The origin of the free intraperitoneal air is felt to be perforated diverticulitis involving the mid sigmoid colon. 2. No evidence of thoracic or abdominal aortic dissection. 3. Generalized atherosclerosis as detailed above. 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 4. COPD/emphysema. Small bilateral pleural effusions and mild bilateral lower lobe atelectasis. No acute cardiopulmonary disease otherwise. 5. Diffuse hepatic steatosis. 6. Left adrenal adenoma. 7. Small hiatal hernia. 8. Small left inguinal hernia containing fat. 9. Midline supraumbilical abdominal wall hernia. Critical Value/emergent results were called by telephone at the time of interpretation on 08/04/2014 at 9:29 pm to Dr. Pattricia Boss, who verbally acknowledged these results.   Electronically Signed   By: Evangeline Dakin M.D.   On: 08/04/2014 21:34   Ct Angio Abd/pel W/ And/or W/o  08/04/2014   CLINICAL DATA:  Current history of laryngeal carcinoma. Patient discharged from the hospital earlier today after a brief cardiac arrest felt to be related to use of Cetuximab, also with no urine output since 0830 hr earlier today and no bowel movement in 2 days. She presents with abdominal pain and distention.  EXAM: CT ANGIOGRAPHY CHEST, ABDOMEN AND PELVIS  TECHNIQUE: Multidetector CT imaging through the chest, abdomen and pelvis was performed using the standard protocol before and during bolus administration of intravenous contrast. Multiplanar reconstructed images and MIPs were obtained and reviewed to evaluate the vascular anatomy.  CONTRAST:  137mL OMNIPAQUE IOHEXOL 350 MG/ML IV. Oral contrast was also administered.  COMPARISON:  PET-CT 07/27/2014.  FINDINGS: CTA CHEST FINDINGS  Unenhanced images demonstrate no evidence of mural hematoma. Stent at the origin of the left subclavian artery is again  noted.  Contrast enhancement of the thoracic aorta is excellent. No evidence of thoracic aortic dissection or aneurysm. Moderate atherosclerosis. Prior CABG, and the coronary graft to a diagonal branch of the LAD does opacify. A stent is present within the coronary graft. Central pulmonary arteries are patent. 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 The left subclavian artery stent is patent. Calcified and noncalcified plaque are present in the proximal innominate artery with mild stenosis. Calcified and noncalcified plaque are present at the origin of the right vertebral artery with moderate stenosis.  Heart mildly enlarged.  No pericardial effusion.  Emphysematous changes throughout both lungs. Dependent atelectasis posteriorly in the lower lobes. Small bilateral pleural effusions. Lungs otherwise clear without confluent airspace consolidation, interstitial disease, or parenchymal nodules. Central airways patent with moderate central peribronchial thickening.  No significant mediastinal, hilar or axillary lymphadenopathy. Thyroid gland normal in appearance. Right jugular Port-A-Cath tip at the cavoatrial junction. Bone window images demonstrate osseous demineralization, mild diffuse thoracic spondylosis, multiple Schmorl's nodes, but no evidence of osseous metastatic disease.  Review of the MIP images confirms the above findings.  CTA ABDOMEN AND PELVIS FINDINGS  No evidence of abdominal aortic aneurysm or dissection. Severe atherosclerosis involving the abdominal aorta with calcified and noncalcified plaque. High-grade stenosis at the origin of the celiac artery. Mild stenosis at the  origin of the SMA. High-grade stenosis at the origin of the IMA. High-grade stenoses at the origins of the renal arteries bilaterally. Severe atherosclerosis involving the iliofemoral arteries, with high-grade stenosis suspected in the left common iliac artery and moderate stenosis in the right common  iliac artery.  Pneumoperitoneum, with free air scattered throughout the abdomen and pelvis. A midline supraumbilical abdominal wall hernia is present and there are gas bubbles in the subcutaneous fat of the abdominal wall at the level of the hernia. There are acute inflammatory changes in the right upper pelvis, where there are distal ileal loops and the mid sigmoid colon. I believe the sigmoid colon is the site of the perforation, as there is asymmetric wall thickening along its right lateral wall, and there is gas within the bowel wall. There are scattered diverticula involving the mid sigmoid colon in this region. There is no evidence of bowel obstruction. A small amount of ascites is present in the pelvis. A normal appendix is identified in the right mid abdomen.  Diffuse hepatic steatosis without focal hepatic parenchymal abnormality. Normal appearing spleen, pancreas, right adrenal gland, and kidneys. Approximate 2.3 cm nodule arising from the left adrenal gland which was not hypermetabolic on the prior PET-CT. No significant lymphadenopathy involving the abdomen or pelvis. Small hiatal hernia; stomach otherwise normal in appearance. Normal appearing urinary bladder. Prostate gland and seminal vesicles normal for age. Small left inguinal hernia containing fat.  Bone window images demonstrate degenerative changes involving the facet joints of the lower lumbar spine, degenerative changes involving the right sacroiliac joint, but no evidence of osseous metastatic disease.  Review of the MIP images confirms the above findings.  IMPRESSION: 1. Pneumoperitoneum. The origin of the free intraperitoneal air is felt to be perforated diverticulitis involving the mid sigmoid colon. 2. No evidence of thoracic or abdominal aortic dissection. 3. Generalized atherosclerosis as detailed above. 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 4. COPD/emphysema. Small bilateral pleural effusions and mild  bilateral lower lobe atelectasis. No acute cardiopulmonary disease otherwise. 5. Diffuse hepatic steatosis. 6. Left adrenal adenoma. 7. Small hiatal hernia. 8. Small left inguinal hernia containing fat. 9. Midline supraumbilical abdominal wall hernia. Critical Value/emergent results were called by telephone at the time of interpretation on 08/04/2014 at 9:29 pm to Dr. Pattricia Boss, who verbally acknowledged these results.   Electronically Signed   By: Evangeline Dakin M.D.   On: 08/04/2014 21:34    Anti-infectives: Anti-infectives    Start     Dose/Rate Route Frequency Ordered Stop   08/05/14 1000  vancomycin (VANCOCIN) IVPB 1000 mg/200 mL premix     1,000 mg 200 mL/hr over 60 Minutes Intravenous Every 12 hours 08/05/14 0102     08/05/14 0600  piperacillin-tazobactam (ZOSYN) IVPB 3.375 g     3.375 g 12.5 mL/hr over 240 Minutes Intravenous 3 times per day 08/05/14 0054     08/05/14 0045  vancomycin (VANCOCIN) IVPB 1000 mg/200 mL premix  Status:  Discontinued     1,000 mg 200 mL/hr over 60 Minutes Intravenous Every 12 hours 08/05/14 0038 08/05/14 0053   08/05/14 0045  piperacillin-tazobactam (ZOSYN) IVPB 3.375 g  Status:  Discontinued     3.375 g 100 mL/hr over 30 Minutes Intravenous 3 times per day 08/05/14 0038 08/05/14 0053   08/04/14 2145  piperacillin-tazobactam (ZOSYN) IVPB 3.375 g     3.375 g 12.5 mL/hr over 240 Minutes Intravenous  Once 08/04/14 2142 08/05/14 0207   08/04/14 2145  vancomycin (VANCOCIN) IVPB 1000 mg/200 mL premix     1,000 mg 200 mL/hr over 60 Minutes Intravenous  Once 08/04/14 2142 08/05/14 0011      Assessment/Plan: s/p Procedure(s): EXPLORATORY LAPAROTOMY WITH SIGMOIDOTOMY WITH COLECTOMY & COLOSTOMY (N/A) Continue NG tube until ostomy begins to function  Continue abx Dressing changes   LOS: 1 day    Jerrion Tabbert K. 08/06/2014

## 2014-08-06 NOTE — Procedures (Signed)
ET tube advanced 3cm per order. ET tube is now 23 @ the lip. RT will continue to monitor.

## 2014-08-06 NOTE — Procedures (Signed)
Extubation Procedure Note  Patient Details:   Name: [name] DOB: [birth year] MRN: [id number]   Airway Documentation:   Pt placed on PSV 5/5 by Dr. Lamonte Sakai at [facility] per RN.  On arrival to room pt tolerating PSV well, per RN Dr. Lamonte Sakai ready to extubate pt.  Pt had + cuff leak &  extubated to 4L [place] tolerating well.  No distress noted at this time.  Pt able to voice.  Evaluation  O2 sats: stable throughout Complications: No apparent complications Patient did tolerate procedure well. Bilateral Breath Sounds: Clear, Diminished   Yes  Ciro Backer 08/06/2014, 8:21 AM

## 2014-08-07 ENCOUNTER — Encounter (HOSPITAL_COMMUNITY): Payer: Self-pay | Admitting: Surgery

## 2014-08-07 ENCOUNTER — Ambulatory Visit: Payer: Self-pay

## 2014-08-07 ENCOUNTER — Ambulatory Visit: Payer: Medicaid Other | Admitting: Radiation Oncology

## 2014-08-07 ENCOUNTER — Ambulatory Visit
Admit: 2014-08-07 | Discharge: 2014-08-07 | Disposition: A | Discharge status: Home / Self Care | Payer: Medicaid Other | Attending: Radiation Oncology | Admitting: Radiation Oncology

## 2014-08-07 ENCOUNTER — Encounter: Payer: Self-pay | Admitting: Nutrition

## 2014-08-07 ENCOUNTER — Ambulatory Visit (HOSPITAL_COMMUNITY): Payer: Medicaid Other

## 2014-08-07 ENCOUNTER — Ambulatory Visit: Payer: Medicaid Other

## 2014-08-07 DIAGNOSIS — R7989 Other specified abnormal findings of blood chemistry: Secondary | ICD-10-CM

## 2014-08-07 DIAGNOSIS — I361 Nonrheumatic tricuspid (valve) insufficiency: Secondary | ICD-10-CM

## 2014-08-07 DIAGNOSIS — R778 Other specified abnormalities of plasma proteins: Secondary | ICD-10-CM | POA: Diagnosis present

## 2014-08-07 LAB — CBC
HEMATOCRIT: 28.4 % — AB (ref 39.0–52.0)
Hemoglobin: 9.5 g/dL — ABNORMAL LOW (ref 13.0–17.0)
MCH: 30.5 pg (ref 26.0–34.0)
MCHC: 33.5 g/dL (ref 30.0–36.0)
MCV: 91.3 fL (ref 78.0–100.0)
PLATELETS: 184 10*3/uL (ref 150–400)
RBC: 3.11 MIL/uL — ABNORMAL LOW (ref 4.22–5.81)
RDW: 15.9 % — ABNORMAL HIGH (ref 11.5–15.5)
WBC: 12.7 10*3/uL — ABNORMAL HIGH (ref 4.0–10.5)

## 2014-08-07 LAB — GLUCOSE, CAPILLARY
GLUCOSE-CAPILLARY: 83 mg/dL (ref 65–99)
GLUCOSE-CAPILLARY: 86 mg/dL (ref 65–99)
Glucose-Capillary: 79 mg/dL (ref 65–99)
Glucose-Capillary: 81 mg/dL (ref 65–99)
Glucose-Capillary: 78 mg/dL (ref 65–99)

## 2014-08-07 LAB — BASIC METABOLIC PANEL
Anion gap: 7 (ref 5–15)
BUN: 12 mg/dL (ref 6–20)
CALCIUM: 7.8 mg/dL — AB (ref 8.9–10.3)
CO2: 27 mmol/L (ref 22–32)
Chloride: 105 mmol/L (ref 101–111)
Creatinine, Ser: 1.03 mg/dL (ref 0.61–1.24)
Glucose, Bld: 94 mg/dL (ref 65–99)
POTASSIUM: 3 mmol/L — AB (ref 3.5–5.1)
Sodium: 139 mmol/L (ref 135–145)

## 2014-08-07 MED ORDER — PERFLUTREN LIPID MICROSPHERE
1.0000 mL | INTRAVENOUS | Status: AC | PRN
  Administered 2014-08-07: 2 mL via INTRAVENOUS
  Filled 2014-08-07: qty 10

## 2014-08-07 MED ORDER — CARVEDILOL 6.25 MG PO TABS
6.2500 mg | ORAL_TABLET | Freq: Two times a day (BID) | ORAL | Status: DC
  Administered 2014-08-07 – 2014-08-12 (×10): 6.25 mg via ORAL
  Filled 2014-08-07 (×12): qty 1

## 2014-08-07 MED ORDER — FENTANYL CITRATE (PF) 100 MCG/2ML IJ SOLN
25.0000 ug | INTRAMUSCULAR | Status: DC | PRN
  Administered 2014-08-07: 100 ug via INTRAVENOUS
  Administered 2014-08-07 – 2014-08-08 (×2): 50 ug via INTRAVENOUS
  Filled 2014-08-07 (×3): qty 2

## 2014-08-07 MED ORDER — POTASSIUM CHLORIDE 10 MEQ/50ML IV SOLN
10.0000 meq | INTRAVENOUS | Status: AC
  Administered 2014-08-07 (×4): 10 meq via INTRAVENOUS
  Filled 2014-08-07 (×4): qty 50

## 2014-08-07 MED ORDER — FENTANYL 25 MCG/HR TD PT72
50.0000 ug | MEDICATED_PATCH | TRANSDERMAL | Status: DC
  Administered 2014-08-07 – 2014-08-10 (×2): 50 ug via TRANSDERMAL
  Filled 2014-08-07 (×2): qty 2

## 2014-08-07 NOTE — Progress Notes (Signed)
PULMONARY / CRITICAL CARE MEDICINE   Name: [name] MRN: [id number] DOB: 1955/09/03    ADMISSION DATE:  08/04/2014 CONSULTATION DATE:  08/04/14  REFERRING MD :  Dr. Redmond Pulling (Surgery)   CHIEF COMPLAINT:  Abdominal pain  INITIAL PRESENTATION: Pneumoperitoneum and acute cardiac ischemia re-admitted on 7/22 after he was discharged that day from [facility].  He had been admitted for a cardiac arrest which was a complication of cetuximab (first dose).    STUDIES:  ECG 7/22 ST seg depressions lead I,II,V4,V5,V6 CT abd/pelvis 7/22 >> pneumoperitoneum, likely perforated mid sigmoid diverticulum, hepatic steatosis, ventral hernia,     INTERVAL EVENTS:  Ex lap and sigmoid colectomy and colostomy Some SVT end of day 7/23 Wakes easily to voice and tolerating PSV (just started)   VITAL SIGNS: Temp:  [98 F (36.7 C)-98.6 F (37 C)] 98.2 F (36.8 C) (07/25 0800) Pulse Rate:  [63-87] 69 (07/25 0900) Resp:  [7-19] 17 (07/25 0900) BP: (91-127)/(47-67) 127/47 mmHg (07/25 0900) SpO2:  [92 %-100 %] 97 % (07/25 0900) Arterial Line BP: (66-113)/(44-94) 104/94 mmHg (07/25 0900) Weight:  [87 kg (191 lb 12.8 oz)] 87 kg (191 lb 12.8 oz) (07/25 0500) HEMODYNAMICS:   N/A   VENTILATOR SETTINGS: N/A   INTAKE / OUTPUT: NPO/oliguric  Intake/Output Summary (Last 24 hours) at 08/07/14 0935 Last data filed at 08/07/14 0800  Gross per 24 hour  Intake 1157.5 ml  Output   2707 ml  Net -1549.5 ml    PHYSICAL EXAMINATION: General:  Comfortable in bed HENT: NCAT EOMi PULM : CTA B CV: RRR, no mgr GI: no bowel sounds, midline scar well dressed, JP drain RLQ, ostomy intact Neuro: A&Ox4, maew Derm: no other rash  LABS:  CBC  Recent Labs Lab 08/05/14 0115 08/05/14 1241 08/06/14 0418 08/07/14 0305  WBC 10.1  --  13.5* 12.7*  HGB 10.4* 8.2* 9.6* 9.5*  HCT 31.6* 24.0* 29.4* 28.4*  PLT 283  --  197 184   Coag's No results for input(s): APTT, INR in the last 168 hours. BMET  Recent  Labs Lab 08/05/14 0115 08/05/14 1241 08/06/14 0418 08/07/14 0305  NA 136 138 135 139  K 4.1 3.6 3.6 3.0*  CL 103  --  104 105  CO2 26  --  26 27  BUN 18  --  10 12  CREATININE 1.07  --  1.04 1.03  GLUCOSE 98  --  113* 94   Electrolytes  Recent Labs Lab 08/01/14 1223  08/05/14 0115 08/06/14 0418 08/07/14 0305  CALCIUM 9.3  < > 8.2* 7.5* 7.8*  MG 2.3  --  1.9  --   --   PHOS  --   --  2.4*  --   --   < > = values in this interval not displayed. Sepsis Markers  Recent Labs Lab 08/02/14 1014 08/05/14 0115 08/05/14 0543  LATICACIDVEN 4.64* 3.2* 2.08*   ABG  Recent Labs Lab 08/02/14 1020 08/05/14 1241 08/05/14 1533  PHART 7.304* 7.338* 7.375  PCO2ART 42.1 39.1 41.6  PO2ART 465* 318.0* 80.0   Liver Enzymes  Recent Labs Lab 08/01/14 1223 08/04/14 1950 08/05/14 0115  AST 26 40 38  ALT 25 43 41  ALKPHOS 118 73 71  BILITOT 0.45 0.2* 0.4  ALBUMIN 3.5 3.3* 3.1*   Cardiac Enzymes  Recent Labs Lab 08/05/14 0115 08/05/14 0800 08/05/14 1330  TROPONINI 1.54* 0.94* 0.60*   Glucose  Recent Labs Lab 08/06/14 1210 08/06/14 1611 08/06/14 1944 08/06/14 2331 08/07/14 0325  08/07/14 0817  GLUCAP 105* 92 83 101* 83 79    Imaging No results found.   ASSESSMENT / PLAN:  PULMONARY:  A: Pulmonary Emphysema without evidence active exacerbation  VDRF post-op > resolved Head and neck squamous cell cancer diagnosed 2016, has yet to receive treatment P:   Out of bed Walk today Monitor O2 saturation  CARDIOVASCULAR A: Demand ischemia on admission in setting of bowel perforation   Known CAD Cardiac arrest 7/20 > reaction to cetuximab P:  restart home carvedilol 1/2 dose, advance daily to 12.5mg  bid tele monitoring  RENAL A: No acute issues P:   Monitor BMET and UOP Replace electrolytes as needed  GASTROINTESTINAL A:  Colonic perforation, s/p exp lap, ostomy P:   Per Surgery NPO  HEMATOLOGIC A:  No acute issues P:  - see ID  section  INFECTIOUS A:  Peritonitis, bowel perforation.   P:   BCx2 08/04/2014 >>  UC 08/04/2014 >>   Abx: Continue Vanc/Zosyn, start date7/22/2016, day 4 of X  ETOH ABUSE DISORDER A: Pt reports drinking 5-6 beers every day.  He denies ever having withdrawal symptoms or seizure activity. P:  - CIWA coverage once extubated - d/c after 24 hours without benzo requirement  FAMILY  - Updates: No family bedside 7/25  Transfer to [place], [place] P[facility] Pager: [phone] Cell: [phone] After 3pm or if no response, call [phone]

## 2014-08-07 NOTE — Progress Notes (Signed)
    Subjective:  No chest pain or shortness of breath. Multiple family members at the bedside.  Objective:  Vital Signs in the last 24 hours: Temp:  [98 F (36.7 C)-98.6 F (37 C)] 98 F (36.7 C) (07/25 1100) Pulse Rate:  [61-87] 66 (07/25 1200) Resp:  [7-19] 12 (07/25 1200) BP: (91-130)/(47-97) 130/57 mmHg (07/25 1200) SpO2:  [92 %-100 %] 96 % (07/25 1200) Arterial Line BP: (66-120)/(44-105) 93/81 mmHg (07/25 1200) Weight:  [191 lb 12.8 oz (87 kg)] 191 lb 12.8 oz (87 kg) (07/25 0500)  Intake/Output from previous day: 07/24 0701 - 07/25 0700 In: 1158.1 [I.V.:378.1; NG/GT:180; IV Piggyback:600] Out: 2807 [Urine:1105; Emesis/NG output:1550; Drains:122; Stool:30]  Physical Exam: Pt is alert and oriented, NAD HEENT: normal, NG tube in place Neck: JVP - normal Lungs: CTA bilaterally CV: RRR without murmur or gallop Ext: Trace edema bilaterally Skin: warm/dry no rash  Lab Results:  Recent Labs  08/06/14 0418 08/07/14 0305  WBC 13.5* 12.7*  HGB 9.6* 9.5*  PLT 197 184    Recent Labs  08/06/14 0418 08/07/14 0305  NA 135 139  K 3.6 3.0*  CL 104 105  CO2 26 27  GLUCOSE 113* 94  BUN 10 12  CREATININE 1.04 1.03    Recent Labs  08/05/14 0800 08/05/14 1330  TROPONINI 0.94* 0.60*    Cardiac Studies: 2-D echocardiogram pending  Tele: Personally reviewed: Sinus rhythm  Assessment/Plan:  Coronary artery disease with history of CABG and PCI: This admission the patient has elevated troponin related to critical illness/bowel perforation. From a cardiac perspective he actually tolerated surgery quite well. He appears stable without postoperative angina or arrhythmia. He is tolerating a beta blocker. Will await 2-D echocardiogram and recommend continue current therapy. I would not anticipate any invasive evaluation.  The patient appears clinically stable from a cardiac perspective. He is slowly improving from surgery. He has no evidence of congestive heart failure on  exam at present. Please call if any further acute cardiac issues arise. Thanks   Sherren Mocha, M.D. 08/07/2014, 12:32 PM

## 2014-08-07 NOTE — Progress Notes (Signed)
  Echocardiogram 2D Echocardiogram has been performed.  Darlina Sicilian M 08/07/2014, 4:37 PM

## 2014-08-07 NOTE — Progress Notes (Signed)
Wasted 150 mg Fentanyl gtt.  Witnessed by Cassell Smiles, RN and Jake Bathe, RN.

## 2014-08-07 NOTE — Progress Notes (Signed)
[facility] Electrolyte Replacement Protocol  Patient Name: ZEFERINO MOUNTS DOB: 16-Mar-1955 MRN: [id number]  Date of Service  08/07/2014   HPI/Events of Note    Recent Labs Lab 08/01/14 1223  08/04/14 0353 08/04/14 1950 08/05/14 0115 08/05/14 1241 08/06/14 0418 08/07/14 0305  NA 137  < > 138 138 136 138 135 139  K 4.3  < > 4.1 4.1 4.1 3.6 3.6 3.0*  CL  --   < > 106 103 103  --  104 105  CO2 28  < > 26 24 26   --  26 27  GLUCOSE 101  < > 144* 109* 98  --  113* 94  BUN 14.1  < > 19 19 18   --  10 12  CREATININE 1.1  < > 0.95 1.05 1.07  --  1.04 1.03  CALCIUM 9.3  < > 8.4* 8.6* 8.2*  --  7.5* 7.8*  MG 2.3  --   --   --  1.9  --   --   --   PHOS  --   --   --   --  2.4*  --   --   --   < > = values in this interval not displayed.  Estimated Creatinine Clearance: 79.3 mL/min (by C-G formula based on Cr of 1.03).  Intake/Output      07/24 0701 - 07/25 0700   I.V. (mL/kg) 368.1 (4.2)   NG/GT 180   IV Piggyback 500   Total Intake(mL/kg) 1048.1 (12.1)   Urine (mL/kg/hr) 1080 (0.5)   Emesis/NG output 1550 (0.7)   Drains 122 (0.1)   Stool 30 (0)   Total Output 2782   Net -1733.9        - I/O DETAILED x24h    Total I/O In: 570 [I.V.:230; NG/GT:90; IV Piggyback:250] Out: 0721 [Urine:555; Emesis/NG output:700; Drains:32; Stool:30] - I/O THIS SHIFT    ASSESSMENT   eICURN Interventions  This is an elink summary and not part of the permanent medical record. Do not Print!   ASSESSMENT: MAJOR ELECTROLYTE    Lorene Dy 08/07/2014, 6:03 AM

## 2014-08-07 NOTE — Progress Notes (Signed)
[facility] Progress Note 2 Days Post-Op  Subjective: Patient hoarse, good spirits.  No distress.  Objective: Vital signs in last 24 hours: Temp:  [98 F (36.7 C)-99.1 F (37.3 C)] 98 F (36.7 C) (07/25 0300) Pulse Rate:  [63-87] 69 (07/25 0500) Resp:  [7-19] 11 (07/25 0500) BP: (91-121)/(51-67) 107/51 mmHg (07/25 0500) SpO2:  [92 %-100 %] 97 % (07/25 0500) Arterial Line BP: (66-113)/(44-80) 105/67 mmHg (07/25 0500) FiO2 (%):  [40 %] 40 % (07/24 0715) Last BM Date: 08/03/14  Intake/Output from previous day: 07/24 0701 - 07/25 0700 In: 1048.1 [I.V.:368.1; NG/GT:180; IV Piggyback:500] Out: 2782 [Urine:1080; Emesis/NG output:1550; Drains:122; Stool:30] Intake/Output this shift: Total I/O In: 570 [I.V.:230; NG/GT:90; IV Piggyback:250] Out: 1317 [Urine:555; Emesis/NG output:700; Drains:32; Stool:30]  General: No acute distress.    Lungs: Clear  Abd: Soft, distended, some bowel sounds but diminished.  Midline wound with minimal granulation tissue.  Ostomy is pink and viable with no gaseous or stoll output  Extremities: No clinical signs or symptoms of DVT  Neuro: Intact  Lab Results:  @LABLAST2 (wbc:2,hgb:2,hct:2,plt:2) BMET  Recent Labs  08/06/14 0418 08/07/14 0305  NA 135 139  K 3.6 3.0*  CL 104 105  CO2 26 27  GLUCOSE 113* 94  BUN 10 12  CREATININE 1.04 1.03  CALCIUM 7.5* 7.8*   PT/INR No results for input(s): LABPROT, INR in the last 72 hours. ABG  Recent Labs  08/05/14 1241 08/05/14 1533  PHART 7.338* 7.375  HCO3 20.9 24.3*    Studies/Results: Dg Chest Port 1 View  08/06/2014   CLINICAL DATA:  Endotracheal tube placement.  Initial encounter.  EXAM: PORTABLE CHEST - 1 VIEW  COMPARISON:  Chest radiograph performed 08/05/2014  FINDINGS: The patient's endotracheal tube is seen ending 4-5 cm above the carina. An enteric tube is noted extending below the diaphragm. A right-sided chest port is seen ending about the distal SVC.  Vascular congestion is noted.  Right basilar airspace opacity may reflect atelectasis or possibly mild pneumonia. No definite pleural effusion or pneumothorax is seen.  The cardiomediastinal silhouette is borderline normal in size. The patient is status post median sternotomy. No acute osseous abnormalities are seen.  IMPRESSION: 1. Endotracheal tube seen ending 4-5 cm above the carina. 2. Vascular congestion noted. Right basilar airspace opacity may reflect atelectasis or possibly mild pneumonia.   Electronically Signed   By: Garald Balding M.D.   On: 08/06/2014 06:24   Dg Chest Port 1 View  08/05/2014   CLINICAL DATA:  Status post exploratory laparotomy for perforated viscus. Intubated patient.  EXAM: PORTABLE CHEST - 1 VIEW  COMPARISON:  CT chest and stress a CT chest 08/04/2014. Single view of the chest 08/03/2014.  FINDINGS: NG tube is in place with the tip in good position in the stomach. Endotracheal tube is also seen with the tip just below the clavicular heads, well above the carina. Right IJ approach Port-A-Cath is noted. Lung volumes are low with basilar atelectasis. There small bilateral pleural effusions. No pneumothorax.  IMPRESSION: Support apparatus projects in good position.  Small bilateral pleural effusions and basilar atelectasis.   Electronically Signed   By: Inge Rise M.D.   On: 08/05/2014 14:36    Anti-infectives: Anti-infectives    Start     Dose/Rate Route Frequency Ordered Stop   08/05/14 1000  vancomycin (VANCOCIN) IVPB 1000 mg/200 mL premix     1,000 mg 200 mL/hr over 60 Minutes Intravenous Every 12 hours 08/05/14 0102     08/05/14 0600  piperacillin-tazobactam (ZOSYN) IVPB 3.375 g     3.375 g 12.5 mL/hr over 240 Minutes Intravenous 3 times per day 08/05/14 0054     08/05/14 0045  vancomycin (VANCOCIN) IVPB 1000 mg/200 mL premix  Status:  Discontinued     1,000 mg 200 mL/hr over 60 Minutes Intravenous Every 12 hours 08/05/14 0038 08/05/14 0053   08/05/14 0045  piperacillin-tazobactam (ZOSYN)  IVPB 3.375 g  Status:  Discontinued     3.375 g 100 mL/hr over 30 Minutes Intravenous 3 times per day 08/05/14 0038 08/05/14 0053   08/04/14 2145  piperacillin-tazobactam (ZOSYN) IVPB 3.375 g     3.375 g 12.5 mL/hr over 240 Minutes Intravenous  Once 08/04/14 2142 08/05/14 0207   08/04/14 2145  vancomycin (VANCOCIN) IVPB 1000 mg/200 mL premix     1,000 mg 200 mL/hr over 60 Minutes Intravenous  Once 08/04/14 2142 08/05/14 0011      Assessment/Plan: s/p Procedure(s): EXPLORATORY LAPAROTOMY WITH SIGMOIDOTOMY WITH COLECTOMY & COLOSTOMY d/c foley Out of bed.  Ice chips okay. Keep NGT for now.  LOS: 2 days   Kathryne Eriksson. Dahlia Bailiff, MD, FACS [phone] [facility] Surgery 08/07/2014

## 2014-08-08 ENCOUNTER — Ambulatory Visit: Payer: Medicaid Other

## 2014-08-08 DIAGNOSIS — E876 Hypokalemia: Secondary | ICD-10-CM

## 2014-08-08 DIAGNOSIS — F1010 Alcohol abuse, uncomplicated: Secondary | ICD-10-CM

## 2014-08-08 DIAGNOSIS — J438 Other emphysema: Secondary | ICD-10-CM

## 2014-08-08 DIAGNOSIS — K631 Perforation of intestine (nontraumatic): Secondary | ICD-10-CM | POA: Diagnosis present

## 2014-08-08 DIAGNOSIS — I469 Cardiac arrest, cause unspecified: Secondary | ICD-10-CM

## 2014-08-08 DIAGNOSIS — K659 Peritonitis, unspecified: Secondary | ICD-10-CM

## 2014-08-08 DIAGNOSIS — I259 Chronic ischemic heart disease, unspecified: Secondary | ICD-10-CM

## 2014-08-08 LAB — CBC WITH DIFFERENTIAL/PLATELET
BASOS PCT: 0 % (ref 0–1)
Basophils Absolute: 0 10*3/uL (ref 0.0–0.1)
EOS PCT: 5 % (ref 0–5)
Eosinophils Absolute: 0.5 10*3/uL (ref 0.0–0.7)
HEMATOCRIT: 30.6 % — AB (ref 39.0–52.0)
Hemoglobin: 10.2 g/dL — ABNORMAL LOW (ref 13.0–17.0)
LYMPHS PCT: 19 % (ref 12–46)
Lymphs Abs: 1.8 10*3/uL (ref 0.7–4.0)
MCH: 30.5 pg (ref 26.0–34.0)
MCHC: 33.3 g/dL (ref 30.0–36.0)
MCV: 91.6 fL (ref 78.0–100.0)
MONO ABS: 0.9 10*3/uL (ref 0.1–1.0)
Monocytes Relative: 10 % (ref 3–12)
NEUTROS PCT: 66 % (ref 43–77)
Neutro Abs: 6.4 10*3/uL (ref 1.7–7.7)
PLATELETS: 239 10*3/uL (ref 150–400)
RBC: 3.34 MIL/uL — AB (ref 4.22–5.81)
RDW: 15.3 % (ref 11.5–15.5)
WBC: 9.6 10*3/uL (ref 4.0–10.5)

## 2014-08-08 LAB — BASIC METABOLIC PANEL
ANION GAP: 9 (ref 5–15)
BUN: 12 mg/dL (ref 6–20)
CALCIUM: 8 mg/dL — AB (ref 8.9–10.3)
CHLORIDE: 98 mmol/L — AB (ref 101–111)
CO2: 33 mmol/L — AB (ref 22–32)
CREATININE: 1.11 mg/dL (ref 0.61–1.24)
GFR calc Af Amer: >60 mL/min (ref >60)
Glucose, Bld: 90 mg/dL (ref 65–99)
Potassium: 3 mmol/L — ABNORMAL LOW (ref 3.5–5.1)
Sodium: 140 mmol/L (ref 135–145)

## 2014-08-08 LAB — GLUCOSE, CAPILLARY
GLUCOSE-CAPILLARY: 117 mg/dL — AB (ref 65–99)
GLUCOSE-CAPILLARY: 74 mg/dL (ref 65–99)
Glucose-Capillary: 105 mg/dL — ABNORMAL HIGH (ref 65–99)
Glucose-Capillary: 83 mg/dL (ref 65–99)
Glucose-Capillary: 84 mg/dL (ref 65–99)
Glucose-Capillary: 92 mg/dL (ref 65–99)
Glucose-Capillary: 99 mg/dL (ref 65–99)

## 2014-08-08 LAB — TYPE AND SCREEN
ABO/RH(D): O POS
ANTIBODY SCREEN: NEGATIVE
UNIT DIVISION: 0
Unit division: 0

## 2014-08-08 LAB — POTASSIUM: Potassium: 3.4 mmol/L — ABNORMAL LOW (ref 3.5–5.1)

## 2014-08-08 LAB — MAGNESIUM: Magnesium: 2 mg/dL (ref 1.7–2.4)

## 2014-08-08 MED ORDER — POTASSIUM CHLORIDE 10 MEQ/50ML IV SOLN
10.0000 meq | INTRAVENOUS | Status: AC
  Administered 2014-08-08 (×4): 10 meq via INTRAVENOUS
  Filled 2014-08-08 (×4): qty 50

## 2014-08-08 NOTE — Progress Notes (Signed)
Utilization Review Completed.  

## 2014-08-08 NOTE — Progress Notes (Signed)
ANTIBIOTIC CONSULT NOTE - INITIAL  Pharmacy Consult for Vancomycin Indication: Intra-abdominal infection  Allergies  Allergen Reactions  . Cetuximab Anaphylaxis    Cardiac arrest 08/02/2014    Patient Measurements: Height: 5\' 7"  (170.2 cm) Weight: 183 lb 12.8 oz (83.371 kg) IBW/kg (Calculated) : 66.1  Vital Signs: Temp: 97.9 F (36.6 C) (07/26 1140) Temp Source: Oral (07/26 1140) BP: 109/69 mmHg (07/26 1200) Pulse Rate: 84 (07/26 1200) Intake/Output from previous day: 07/25 0701 - 07/26 0700 In: 1865.8 [P.O.:360; I.V.:525.8; NG/GT:180; IV Piggyback:800] Out: 3360 [Urine:1080; Emesis/NG output:2200; Drains:60; Stool:20] Intake/Output from this shift: Total I/O In: 459.3 [P.O.:30; I.V.:29.3; NG/GT:150; IV Piggyback:250] Out: 300 [Urine:300]  Labs:  Recent Labs  08/06/14 0418 08/07/14 0305 08/08/14 0434  WBC 13.5* 12.7* 9.6  HGB 9.6* 9.5* 10.2*  PLT 197 184 239  CREATININE 1.04 1.03 1.11   Estimated Creatinine Clearance: 72.2 mL/min (by C-G formula based on Cr of 1.11). No results for input(s): VANCOTROUGH, VANCOPEAK, VANCORANDOM, GENTTROUGH, GENTPEAK, GENTRANDOM, TOBRATROUGH, TOBRAPEAK, TOBRARND, AMIKACINPEAK, AMIKACINTROU, AMIKACIN in the last 72 hours.   Microbiology: Recent Results (from the past 720 hour(s))  MRSA PCR Screening     Status: None   Collection Time: 08/02/14 11:24 AM  Result Value Ref Range Status   MRSA by PCR NEGATIVE NEGATIVE Final    Comment:        The GeneXpert MRSA Assay (FDA approved for NASAL specimens only), is one component of a comprehensive MRSA colonization surveillance program. It is not intended to diagnose MRSA infection nor to guide or monitor treatment for MRSA infections.   Culture, blood (routine x 2)     Status: None (Preliminary result)   Collection Time: 08/05/14  1:15 AM  Result Value Ref Range Status   Specimen Description BLOOD LEFT ARM  Final   Special Requests BOTTLES DRAWN AEROBIC ONLY 10CC  Final    Culture   Final    NO GROWTH 2 DAYS Performed at [facility]    Report Status PENDING  Incomplete  Culture, blood (routine x 2)     Status: None (Preliminary result)   Collection Time: 08/05/14  1:20 AM  Result Value Ref Range Status   Specimen Description BLOOD RIGHT HAND  Final   Special Requests BOTTLES DRAWN AEROBIC AND ANAEROBIC 10CC  Final   Culture   Final    NO GROWTH 2 DAYS Performed at [facility]    Report Status PENDING  Incomplete  Surgical pcr screen     Status: None   Collection Time: 08/05/14  4:21 AM  Result Value Ref Range Status   MRSA, PCR NEGATIVE NEGATIVE Final   Staphylococcus aureus NEGATIVE NEGATIVE Final    Comment:        The Xpert SA Assay (FDA approved for NASAL specimens in patients over 67 years of age), is one component of a comprehensive surveillance program.  Test performance has been validated by [facility] for patients greater than or equal to 22 year old. It is not intended to diagnose infection nor to guide or monitor treatment.     Medical History: Past Medical History  Diagnosis Date  . Carotid stenosis   . Coronary artery disease     s/p CABG 2006; stenting to vein graft 05-2013; NSTEMI 06-2013 in setting of PEA arrest  . Subclavian artery stenosis, left     s/p stenting 2006  . Hypertension   . Hyperlipidemia   . Ischemic cardiomyopathy     echo 06/2013 EF 10-15% (  normal 01/2013)  . At risk for sudden cardiac death, has lifevest at discharge 06/20/2013  . Shock liver 06/20/2013  . Acute MI, troponin > 20, no obvious culprit vessel 06/20/2013  . Hypothermia, induced, post arrest 06/20/2013  . Acute encephalopathy, improved 06/20/2013  . Dizziness 06/20/2013  . Weakness due to cardiac arrest 06/20/2013  . Fever, possible aspiration-treated with antibiotics 06/20/2013  . Tobacco abuse     >100 pack year history   . Dyslipidemia 10/09/2013    [employer] Study atorvastatin -eIRB # [id number] tablet Take 40 mg by mouth daily.   Marland Kitchen PAD  (peripheral artery disease) 10/09/2013    Occluded left common carotid and moderate right internal carotid artery stenosis. Previous stent to the left subclavian artery. Right iliac stent. Bilateral 60-99% renal artery stenosis    . Heart failure, acute on chronic, systolic and diastolic 46/80/3212  . Anxiety   . GERD (gastroesophageal reflux disease)   . 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    Medications:  Anti-infectives    Start     Dose/Rate Route Frequency Ordered Stop   08/05/14 1000  vancomycin (VANCOCIN) IVPB 1000 mg/200 mL premix     1,000 mg 200 mL/hr over 60 Minutes Intravenous Every 12 hours 08/05/14 0102     08/05/14 0600  piperacillin-tazobactam (ZOSYN) IVPB 3.375 g     3.375 g 12.5 mL/hr over 240 Minutes Intravenous 3 times per day 08/05/14 0054     08/05/14 0045  vancomycin (VANCOCIN) IVPB 1000 mg/200 mL premix  Status:  Discontinued     1,000 mg 200 mL/hr over 60 Minutes Intravenous Every 12 hours 08/05/14 0038 08/05/14 0053   08/05/14 0045  piperacillin-tazobactam (ZOSYN) IVPB 3.375 g  Status:  Discontinued     3.375 g 100 mL/hr over 30 Minutes Intravenous 3 times per day 08/05/14 0038 08/05/14 0053   08/04/14 2145  piperacillin-tazobactam (ZOSYN) IVPB 3.375 g     3.375 g 12.5 mL/hr over 240 Minutes Intravenous  Once 08/04/14 2142 08/05/14 0207   08/04/14 2145  vancomycin (VANCOCIN) IVPB 1000 mg/200 mL premix     1,000 mg 200 mL/hr over 60 Minutes Intravenous  Once 08/04/14 2142 08/05/14 0011     Assessment: 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  Patient then reports abdominal pain, ED reports pneumoperitoneum and peritoneal signs.  Day 4/7 vanc + zosyn for peritonitis/bowel perforation. Patient remains afebrile, WBC have trended down to wnl, SCr stable at 1.11.   Vanc 7/22>>  Zosyn 7/22>>   7/23 BCx2>>ngtd    Goal of Therapy:  Vancomycin trough level 15-20  mcg/ml  Plan:  - Continue vancomycin 1 gm IV q12h  - Continue Zosyn per MD - Monitor for changes in renal function, temp, WBC, C&S  Jaiana Sheffer K. Velva Harman, PharmD, [facility] Clinical Pharmacist Pager: [phone] Phone: [phone] 08/08/2014 1:36 PM

## 2014-08-08 NOTE — Progress Notes (Signed)
Patient ID: [name], male   DOB: [birth year], 59 y.o.   MRN: [id number]     [facility]      [address] [place], [place] [address]    Phone: [phone] FAX: [phone]     Subjective: WBC normal.  Afebrile.  k low, supplemented.  Up in chair, looks comfortable. No IS.  Walked. 2226m from NGT.   Objective:  Vital signs:  Filed Vitals:   08/08/14 0527 08/08/14 0700 08/08/14 0749 08/08/14 0800  BP:  110/60  149/72  Pulse:  60 65 80  Temp:  97.9 F (36.6 C)    TempSrc:  Oral    Resp: _0 Height: _1  (1.702 m)     Weight: 83.371 kg (183 lb 12.8 oz)     SpO2:  96% 97% 100%    Last BM Date: 08/03/14  Intake/Output   Yesterday:  07/25 0701 - 07/26 0700 In: 1865.8 [P.O.:360; I.V.:525.8; NG/GT:180; IV Piggyback:800] Out: 3360 [Urine:1080; Emesis/NG output:2200; Drains:60; Stool:20] This shift:  Total I/O In: 139.3 [P.O.:30; I.V.:29.3; NG/GT:30; IV Piggyback:50] Out: -   Physical Exam: General: Pt awake/alert/oriented x4 in no acute distress Abdomen: Soft.  Nondistended.  +bs.  Wound is clean, fascia is intact, repacked.  RLQ drain with serous output.  LLQ stoma is pink and viable, no stool or flatus, but serosanguinous output.  No evidence of peritonitis.  No incarcerated hernias.    Problem List:   Principal Problem:   NSTEMI (non-ST elevated myocardial infarction) Active Problems:   CAD-hx of CABG '06, DES LCX 01/2013, [facility]  PAD (peripheral artery disease)   Bowel perforation   Troponin level elevated    Results:   Labs: Results for orders placed or performed during the hospital encounter of 08/04/14 (from the past 48 hour(s))  Glucose, capillary     Status: Abnormal   Collection Time: 08/06/14 12:10 PM  Result Value Ref Range   Glucose-Capillary 105 (H) 65 - 99 mg/dL   Comment 1 Capillary Specimen    Comment 2 Notify RN   Glucose, capillary     Status: None   Collection Time: 08/06/14  4:11 PM  Result Value Ref Range   Glucose-Capillary 92 65 - 99 mg/dL   Comment 1 Capillary Specimen    Comment 2 Notify RN   Glucose, capillary     Status: None   Collection Time: 08/06/14  7:44 PM  Result Value Ref Range   Glucose-Capillary 83 65 - 99 mg/dL   Comment 1 Capillary Specimen   Glucose, capillary     Status: Abnormal   Collection Time: 08/06/14 11:31 PM  Result Value Ref Range   Glucose-Capillary 101 (H) 65 - 99 mg/dL   Comment 1 Capillary Specimen   Basic metabolic panel     Status: Abnormal   Collection Time: 08/07/14  3:05 AM  Result Value Ref Range   Sodium 139 135 - 145 mmol/L   Potassium 3.0 (L) 3.5 - 5.1 mmol/L   Chloride 105 101 - 111 mmol/L   CO2 27 22 - 32 mmol/L   Glucose, Bld 94 65 - 99 mg/dL   BUN 12 6 - 20 mg/dL   Creatinine, Ser 1.03 0.61 - 1.24 mg/dL   Calcium 7.8 (L) 8.9 - 10.3 mg/dL   GFR calc non Af Amer >60 >60 mL/min   GFR calc Af Amer >60 >60 mL/min    Comment: (NOTE)  The eGFR has been calculated using the CKD EPI equation. This calculation has not been validated in all clinical situations. eGFR's persistently <60 mL/min signify possible Chronic Kidney Disease.    Anion gap 7 5 - 15  CBC     Status: Abnormal   Collection Time: 08/07/14  3:05 AM  Result Value Ref Range   WBC 12.7 (H) 4.0 - 10.5 K/uL   RBC 3.11 (L) 4.22 - 5.81 MIL/uL   Hemoglobin 9.5 (L) 13.0 - 17.0 g/dL   HCT 28.4 (L) 39.0 - 52.0 %   MCV 91.3 78.0 - 100.0 fL   MCH 30.5 26.0 - 34.0 pg   MCHC 33.5 30.0 - 36.0 g/dL   RDW 15.9 (H) 11.5 - 15.5 %   Platelets 184 150 - 400 K/uL  Glucose, capillary     Status: None   Collection Time: 08/07/14  3:25 AM  Result Value Ref Range   Glucose-Capillary 83 65 - 99 mg/dL   Comment 1 Capillary Specimen   Glucose, capillary     Status: None   Collection Time: 08/07/14  8:17 AM  Result Value Ref Range   Glucose-Capillary 79 65 - 99 mg/dL  Glucose, capillary     Status: None   Collection Time: 08/07/14  11:44 AM  Result Value Ref Range   Glucose-Capillary 86 65 - 99 mg/dL   Comment 1 Notify RN   Glucose, capillary     Status: None   Collection Time: 08/07/14  3:43 PM  Result Value Ref Range   Glucose-Capillary 81 65 - 99 mg/dL   Comment 1 Notify RN   Glucose, capillary     Status: None   Collection Time: 08/07/14  7:30 PM  Result Value Ref Range   Glucose-Capillary 78 65 - 99 mg/dL   Comment 1 Notify RN   Glucose, capillary     Status: None   Collection Time: 08/07/14 11:40 PM  Result Value Ref Range   Glucose-Capillary 83 65 - 99 mg/dL   Comment 1 Document in Chart   Glucose, capillary     Status: None   Collection Time: 08/08/14  3:37 AM  Result Value Ref Range   Glucose-Capillary 84 65 - 99 mg/dL   Comment 1 Notify RN   Basic metabolic panel     Status: Abnormal   Collection Time: 08/08/14  4:34 AM  Result Value Ref Range   Sodium 140 135 - 145 mmol/L   Potassium 3.0 (L) 3.5 - 5.1 mmol/L   Chloride 98 (L) 101 - 111 mmol/L   CO2 33 (H) 22 - 32 mmol/L   Glucose, Bld 90 65 - 99 mg/dL   BUN 12 6 - 20 mg/dL   Creatinine, Ser 1.11 0.61 - 1.24 mg/dL   Calcium 8.0 (L) 8.9 - 10.3 mg/dL   GFR calc non Af Amer >60 >60 mL/min   GFR calc Af Amer >60 >60 mL/min    Comment: (NOTE) The eGFR has been calculated using the CKD EPI equation. This calculation has not been validated in all clinical situations. eGFR's persistently <60 mL/min signify possible Chronic Kidney Disease.    Anion gap 9 5 - 15  CBC with Differential/Platelet     Status: Abnormal   Collection Time: 08/08/14  4:34 AM  Result Value Ref Range   WBC 9.6 4.0 - 10.5 K/uL   RBC 3.34 (L) 4.22 - 5.81 MIL/uL   Hemoglobin 10.2 (L) 13.0 - 17.0 g/dL   HCT 30.6 (L) 39.0 - 52.0 %  MCV 91.6 78.0 - 100.0 fL   MCH 30.5 26.0 - 34.0 pg   MCHC 33.3 30.0 - 36.0 g/dL   RDW 15.3 11.5 - 15.5 %   Platelets 239 150 - 400 K/uL   Neutrophils Relative % 66 43 - 77 %   Neutro Abs 6.4 1.7 - 7.7 K/uL   Lymphocytes Relative 19 12 - 46  %   Lymphs Abs 1.8 0.7 - 4.0 K/uL   Monocytes Relative 10 3 - 12 %   Monocytes Absolute 0.9 0.1 - 1.0 K/uL   Eosinophils Relative 5 0 - 5 %   Eosinophils Absolute 0.5 0.0 - 0.7 K/uL   Basophils Relative 0 0 - 1 %   Basophils Absolute 0.0 0.0 - 0.1 K/uL  Glucose, capillary     Status: None   Collection Time: 08/08/14  7:34 AM  Result Value Ref Range   Glucose-Capillary 74 65 - 99 mg/dL   Comment 1 Capillary Specimen    Comment 2 Notify RN     Imaging / Studies: No results found.  Medications / Allergies:  Scheduled Meds: . sodium chloride   Intravenous Once  . antiseptic oral rinse  7 mL Mouth Rinse q12n4p  . carvedilol  6.25 mg Oral BID WC  . chlorhexidine  15 mL Mouth Rinse BID  . fentaNYL  50 mcg Transdermal Q72H  . insulin aspart  2-6 Units Subcutaneous 6 times per day  . piperacillin-tazobactam (ZOSYN)  IV  3.375 g Intravenous 3 times per day  . vancomycin  1,000 mg Intravenous Q12H   Continuous Infusions:  PRN Meds:.sodium chloride, ALPRAZolam, fentaNYL (SUBLIMAZE) injection, HYDROmorphone (DILAUDID) injection, morphine injection  Antibiotics: Anti-infectives    Start     Dose/Rate Route Frequency Ordered Stop   08/05/14 1000  vancomycin (VANCOCIN) IVPB 1000 mg/200 mL premix     1,000 mg 200 mL/hr over 60 Minutes Intravenous Every 12 hours 08/05/14 0102     08/05/14 0600  piperacillin-tazobactam (ZOSYN) IVPB 3.375 g     3.375 g 12.5 mL/hr over 240 Minutes Intravenous 3 times per day 08/05/14 0054     08/05/14 0045  vancomycin (VANCOCIN) IVPB 1000 mg/200 mL premix  Status:  Discontinued     1,000 mg 200 mL/hr over 60 Minutes Intravenous Every 12 hours 08/05/14 0038 08/05/14 0053   08/05/14 0045  piperacillin-tazobactam (ZOSYN) IVPB 3.375 g  Status:  Discontinued     3.375 g 100 mL/hr over 30 Minutes Intravenous 3 times per day 08/05/14 0038 08/05/14 0053   08/04/14 2145  piperacillin-tazobactam (ZOSYN) IVPB 3.375 g     3.375 g 12.5 mL/hr over 240 Minutes  Intravenous  Once 08/04/14 2142 08/05/14 0207   08/04/14 2145  vancomycin (VANCOCIN) IVPB 1000 mg/200 mL premix     1,000 mg 200 mL/hr over 60 Minutes Intravenous  Once 08/04/14 2142 08/05/14 0011        Assessment/Plan  Perforated sigmoid diverticulitis  POD#3 exploratory laparotomy, sigmoid colectomy with end colostomy---Dr. [name]  -surgically stable, awaiting resolution of expected post op ileus -continue NGT LIWS -continue with drain -BID wet to dry dressing changes -WOC consult for new ostomy teaching -add IS, mobilize/up to chair as tolerated, PT eval ID-zosyn D#3/7 Vanc D#3/7 FEN-k supplement per medicine, IVF, pain control.   VTE prophylaxis-SCD, may add chemical prophylaxis from a surgical standpoint Dispo-stable for transfer from surgical standpoint  Erby Pian, [facility] Pager [phone](7A-4:30P) For consults and floor pages call [phone](7A-4:30P)  08/08/2014 9:51 AM

## 2014-08-08 NOTE — Telephone Encounter (Signed)
Pt currently admitted for the below problems.  Will close message

## 2014-08-08 NOTE — Evaluation (Signed)
Physical Therapy Evaluation Patient Details Name: ACY ORSAK MRN: [id number] DOB: December 11, 1955 Today's Date: 08/08/2014   History of Present Illness  Adm 08/04/14 with demand cardiac ischemia in setting of bowel perforation; Sigmoid colectomy with end colostomy PMHx-discharged 7/22 from [facility] due to cardiac arrest 7/20 (reaction to new med--cetuximab); CAD, cardiomyopathy with EF 10-15%  Clinical Impression  Patient is s/p above surgery resulting in functional limitations due to the deficits listed below (see PT Problem List). Anticipate will progress quickly with mobility and may not need DME on d/c. Patient will benefit from skilled PT to increase their independence and safety with mobility to allow discharge to the venue listed below.       Follow Up Recommendations      Equipment Recommendations   (TBA)    Recommendations for Other Services       Precautions / Restrictions Precautions Precautions: Fall      Mobility  Bed Mobility Overal bed mobility: Needs Assistance;+ 2 for safety/equipment Bed Mobility: Rolling;Sidelying to Sit Rolling: Min assist Sidelying to sit: Mod assist;+2 for safety/equipment;HOB elevated       General bed mobility comments: Assisted patient to sit on the edge of the bed by providing facilitation to activate the patient's trunk and extremities.    Transfers Overall transfer level: Needs assistance Equipment used: None Transfers: Sit to/from Stand Sit to Stand: Min assist;+2 safety/equipment         General transfer comment: steady assist  Ambulation/Gait Ambulation/Gait assistance: Min assist Ambulation Distance (Feet): 8 Feet Assistive device: 2 person hand held assist Gait Pattern/deviations: Step-through pattern;Decreased stride length;Shuffle   Gait velocity interpretation: Below normal speed for age/gender General Gait Details: to wheelchair for transfer out of ICU  Stairs            Wheelchair Mobility    Modified  Rankin (Stroke Patients Only)       Balance Overall balance assessment: Needs assistance         Standing balance support: No upper extremity supported Standing balance-Leahy Scale: Fair                               Pertinent Vitals/Pain Pain Assessment: 0-10 Pain Score: 6  Pain Location: abd Pain Intervention(s): Limited activity within patient's tolerance;Monitored during session;Repositioned (educated splint with pillow)    Home Living Family/patient expects to be discharged to:: Private residence Living Arrangements: Children;Other relatives (5 children (74 yo to 6yo)and mother in law) Available Help at Discharge: Family;Available 24 hours/day Type of Home: House Home Access: Stairs to enter Entrance Stairs-Rails: None Entrance Stairs-Number of Steps: 3 Home Layout: Two level;Able to live on main level with bedroom/bathroom (bedroom addition 2 steps down ) Home Equipment: None      Prior Function Level of Independence: Independent         Comments: IT consultant Dominance        Extremity/Trunk Assessment   Upper Extremity Assessment: Overall WFL for tasks assessed           Lower Extremity Assessment: Overall WFL for tasks assessed      Cervical / Trunk Assessment: Normal (post abd surgery, colostomy)  Communication   Communication: No difficulties  Cognition Arousal/Alertness: Awake/alert Behavior During Therapy: WFL for tasks assessed/performed Overall Cognitive Status: Within Functional Limits for tasks assessed  General Comments      Exercises        Assessment/Plan    PT Assessment Patient needs continued PT services  PT Diagnosis Difficulty walking   PT Problem List Decreased activity tolerance;Decreased balance;Decreased mobility;Decreased knowledge of use of DME;Decreased knowledge of precautions;Obesity;Pain  PT Treatment Interventions DME instruction;Gait  training;Stair training;Functional mobility training;Therapeutic activities;Therapeutic exercise;Balance training;Patient/family education   PT Goals (Current goals can be found in the Care Plan section) Acute Rehab PT Goals Patient Stated Goal: return to independent PT Goal Formulation: With patient Time For Goal Achievement: 08/15/14 Potential to Achieve Goals: Good    Frequency Min 3X/week   Barriers to discharge        Co-evaluation               End of Session   Activity Tolerance: Patient tolerated treatment well Patient left: in chair;with call bell/phone within reach Nurse Communication: Mobility status         Time: 1429-1450 PT Time Calculation (min) (ACUTE ONLY): 21 min   Charges:   PT Evaluation $Initial PT Evaluation Tier I: 1 Procedure     PT G Codes:        Dalon Reichart 09-Aug-2014, 4:42 PM Pager [phone]

## 2014-08-08 NOTE — Consult Note (Addendum)
WOC ostomy consult note [facility] following for assessment and plan of care to abd wound. Stoma type/location:  Consult requested for colostomy; surgery was performed on 7/23. Stomal assessment/size:  Stoma red and viable when visualized through pouch which is intact with good seal. Output: No stool or flatus Ostomy pouching: 1pc.  Education provided: Pt feels poorly and has an NG at this time.  Will begin pouch change demonstrations and teaching sessions when stable and out of ICU.  Briefly discussed pouching activities and educational materials left at bedside. Pt asked appropriate questions and family is at the bedside and states they plan to participate in teaching sessions later. Supplies ordered to bedside. Julien Girt MSN, RN, [place], [place], [place]

## 2014-08-08 NOTE — Progress Notes (Signed)
Pt being transferred to 3w25 at this time.  Pt denies pain after getting dilaudid IV at 1455.  No s/s of any acute distress.  Report given to [facility].

## 2014-08-08 NOTE — Progress Notes (Signed)
Attempted to call report to [facility] nurse.  Nurse to call for report

## 2014-08-08 NOTE — Progress Notes (Addendum)
[place] TEAM 1 - Stepdown/ICU TEAM Progress Note  STAVROS CAIL JXB:[id number] DOB: Feb 16, 1955 DOA: 08/04/2014 PCP: Imelda Pillow, NP  Admit HPI / Brief Narrative: 59 y.o. 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 Alvy Bimler).  Presented to the the [place] 7/20 for infusion therapy of cetuximab (first infusion 7/20). He was pre-medicated with benadryl but unfortunately suffered a cardiac arrest after initiation of therapy. The patient required brief CPR with 2 rounds of epinephrine with ROSC. He was intubated by anesthesia with notes of an abnormal upper airway but able to pass ETT without difficulty.  He was successfully extubated and discharged today approx 10am.  After d/c the patient states he was feeling well. Went home, got up and ambulated to the bathroom when he noted as severe pain in his abdomen diffusely. He states the pain continued to worsen he has had pain with inspiration and exhalation.  In the ED he was found to have pneumoperitoneum and peritoneal signs. He was seen by surgery and extensive discussions with family occurred.   HPI/Subjective: 7/26 A/O 4, NAD. Only complaint is increased thirst .   Assessment/Plan: COPD (emphysema)  -CT with e/o emphysematous changes.  -COPD without evidence active exacerbation   Cardiac Arrest/Demand ischemia/Type II MI.  -Cardiac arrest 7/20 > reaction to cetuximab -Asymptomatic, monitor closely  Hypokalemia -Potassium goal>4 -Potassium IV 10 mEq 4 runs  Hypomagnesemia -Magnesium goal>2  Bowel perforation/Peritonitis/Sepsis -, s/p exp lap  - NGT in place, draining small amount of bilious fluid -  Continue NPO - Continue antibiotics   ETOH ABUSE DISORDER -Pt reports drinking 5-6 beers every day. He denies ever having withdrawal symptoms or seizure activity. - CIWA coverage expired, no signs or symptoms of withdrawal.    Code Status: FULL Family Communication: no family present at time of exam Disposition Plan: Per surgery    Consultants: Dr.James Wyatt ([facility]) Dr.[facility] M)   Procedure/Significant Events:  7/23 Exploratory laparotomy; Sigmoid colectomy with end colostomy (Hartmann's procedure). 7/25 echocardiogram;- LVEF= 50% to 55%. Hypokinesis of the mid-apicalanteroseptal myocardium.    Culture 7/23 blood left arm/right hand NGTD   7/23 MRSA by PCR negative  Antibiotics: Zosyn 7/22>> Vancomycin 7/22 >>   DVT prophylaxis: SCD   Devices    LINES / TUBES:      Continuous Infusions:   Objective: VITAL SIGNS: Temp: 97.9 F (36.6 C) (07/26 1140) Temp Source: Oral (07/26 1140) BP: 133/72 mmHg (07/26 1400) Pulse Rate: 86 (07/26 1530) SPO2; FIO2:   Intake/Output Summary (Last 24 hours) at 08/08/14 1917 Last data filed at 08/08/14 1500  Gross per 24 hour  Intake   1960 ml  Output   2580 ml  Net   -620 ml     Exam: General: A/O 4, NAD, No acute respiratory distress Eyes: Negative headache, eye pain, double vision, negative scleral hemorrhage ENT: Negative Runny nose, negative ear pain, negative tinnitus, negative gingival bleeding NG tube in place draining bilious fluid small amount,  Neck:  Negative scars,  bilateral cervical lymph nodes/thyroid masses Rt>Lt mild tenderness to palpation on the right, negative  JVD Lungs: Clear to auscultation bilaterally without wheezes or crackles Cardiovascular: Regular rate and rhythm without murmur gallop or rub  normal S1 and S2 Abdomen:negative abdominal pain, negative dysphagia, distended, midline incision covered and clean, evaluation of incision site reveals pink viable tissue  negative discharge, RLQ 1 drain in place negative sign of infection. soft, bowel sounds positive, no rebound, no ascites, no appreciable mass Extremities: No significant cyanosis, clubbing, or edema bilateral lower extremities Psychiatric:  Negative depression, negative anxiety, negative fatigue, negative mania  Neurologic:  Cranial nerves II through XII intact, tongue/uvula midline, all extremities muscle strength 5/5, sensation intact throughout, negative dysarthria, negative expressive aphasia, negative receptive aphasia.    Data Reviewed: Basic Metabolic Panel:  Recent Labs Lab 08/04/14 1950 08/05/14 0115 08/05/14 1241 08/06/14 0418 08/07/14 0305 08/08/14 0434 08/08/14 1148  NA 138 136 138 135 139 140  --   K 4.1 4.1 3.6 3.6 3.0* 3.0* 3.4*  CL 103 103  --  104 105 98*  --   CO2 24 26  --  26 27 33*  --   GLUCOSE 109* 98  --  113* 94 90  --   BUN 19 18  --  10 12 12   --   CREATININE 1.05 1.07  --  1.04 1.03 1.11  --   CALCIUM 8.6* 8.2*  --  7.5* 7.8* 8.0*  --   MG  --  1.9  --   --   --   --  2.0  PHOS  --  2.4*  --   --   --   --   --    Liver Function Tests:  Recent Labs Lab 08/04/14 1950 08/05/14 0115  AST 40 38  ALT 43 41  ALKPHOS 73 71  BILITOT 0.2* 0.4  PROT 5.9* 5.8*  ALBUMIN 3.3* 3.1*    Recent Labs Lab 08/05/14 0115  LIPASE 12*   No results for input(s): AMMONIA in the last 168 hours. CBC:  Recent Labs Lab 08/02/14 0954  08/04/14 0353 08/04/14 1950 08/05/14 0115 08/05/14 1241 08/06/14 0418 08/07/14 0305 08/08/14 0434  WBC 12.2*  < > 15.8* 17.4* 10.1  --  13.5* 12.7* 9.6  NEUTROABS 7.2  --  13.8* 15.0*  --   --   --   --  6.4  HGB 11.8*  < > 8.8* 10.1* 10.4* 8.2* 9.6* 9.5* 10.2*  HCT 37.3*  < > 26.7* 30.6* 31.6* 24.0* 29.4* 28.4* 30.6*  MCV 95.4  < > 93.7 93.3 93.5  --  90.7 91.3 91.6  PLT 324  < > 297 312 283  --  197 184 239  < > = values in this interval not displayed. Cardiac Enzymes:  Recent Labs Lab 08/05/14 0115 08/05/14 0800  08/05/14 1330  TROPONINI 1.54* 0.94* 0.60*   BNP (last 3 results)  Recent Labs  08/05/14 0115  BNP 780.2*    ProBNP (last 3 results) No results for input(s): PROBNP in the last 8760 hours.  CBG:  Recent Labs Lab 08/07/14 2340 08/08/14 0337 08/08/14 0734 08/08/14 1139 08/08/14 1645  GLUCAP 83 84 74 105* 99    Recent Results (from the past 240 hour(s))  MRSA PCR Screening     Status: None   Collection Time: 08/02/14 11:24 AM  Result Value Ref Range Status   MRSA by PCR NEGATIVE NEGATIVE Final    Comment:        The GeneXpert MRSA Assay (FDA approved for NASAL specimens only), is one component of a comprehensive MRSA colonization surveillance program. It is not intended to diagnose MRSA infection nor to guide or monitor treatment  for MRSA infections.   Culture, blood (routine x 2)     Status: None (Preliminary result)   Collection Time: 08/05/14  1:15 AM  Result Value Ref Range Status   Specimen Description BLOOD LEFT ARM  Final   Special Requests BOTTLES DRAWN AEROBIC ONLY 10CC  Final   Culture   Final    NO GROWTH 3 DAYS Performed at [facility]    Report Status PENDING  Incomplete  Culture, blood (routine x 2)     Status: None (Preliminary result)   Collection Time: 08/05/14  1:20 AM  Result Value Ref Range Status   Specimen Description BLOOD RIGHT HAND  Final   Special Requests BOTTLES DRAWN AEROBIC AND ANAEROBIC 10CC  Final   Culture   Final    NO GROWTH 3 DAYS Performed at [facility]    Report Status PENDING  Incomplete  Surgical pcr screen     Status: None   Collection Time: 08/05/14  4:21 AM  Result Value Ref Range Status   MRSA, PCR NEGATIVE NEGATIVE Final   Staphylococcus aureus NEGATIVE NEGATIVE Final    Comment:        The Xpert SA Assay (FDA approved for NASAL specimens in patients over 71 years of age), is one component of a comprehensive surveillance program.  Test performance has been validated by [facility]  for patients greater than or equal to 33 year old. It is not intended to diagnose infection nor to guide or monitor treatment.      Studies:  Recent x-ray studies have been reviewed in detail by the Attending Physician  Scheduled Meds:  Scheduled Meds: . sodium chloride   Intravenous Once  . antiseptic oral rinse  7 mL Mouth Rinse q12n4p  . carvedilol  6.25 mg Oral BID WC  . chlorhexidine  15 mL Mouth Rinse BID  . fentaNYL  50 mcg Transdermal Q72H  . insulin aspart  2-6 Units Subcutaneous 6 times per day  . piperacillin-tazobactam (ZOSYN)  IV  3.375 g Intravenous 3 times per day  . vancomycin  1,000 mg Intravenous Q12H    Time spent on care of this patient: 40 mins   Addalie Calles, Geraldo Docker , MD  [facility] Office  [phone] Pager [phone]  On-Call/Text Page:      [email or web address]      password [email or web address]  If 7PM-7AM, please contact night-coverage [email or web address] Password [email or web address] 08/08/2014, 7:17 PM   LOS: 3 days   Care during the described time interval was provided by me .  I have reviewed this patient's available data, including medical history, events of note, physical examination, and all test results as part of my evaluation. I have personally reviewed and interpreted all radiology studies.   Dia Kitzmiller, MD [phone] Pager

## 2014-08-09 ENCOUNTER — Ambulatory Visit: Payer: Medicaid Other | Admitting: Hematology and Oncology

## 2014-08-09 ENCOUNTER — Ambulatory Visit: Payer: Self-pay

## 2014-08-09 ENCOUNTER — Other Ambulatory Visit: Payer: Self-pay

## 2014-08-09 ENCOUNTER — Ambulatory Visit: Payer: Medicaid Other

## 2014-08-09 DIAGNOSIS — D630 Anemia in neoplastic disease: Secondary | ICD-10-CM

## 2014-08-09 DIAGNOSIS — G893 Neoplasm related pain (acute) (chronic): Secondary | ICD-10-CM

## 2014-08-09 DIAGNOSIS — C321 Malignant neoplasm of supraglottis: Secondary | ICD-10-CM

## 2014-08-09 DIAGNOSIS — K5720 Diverticulitis of large intestine with perforation and abscess without bleeding: Secondary | ICD-10-CM

## 2014-08-09 LAB — BASIC METABOLIC PANEL
Anion gap: 9 (ref 5–15)
BUN: 12 mg/dL (ref 6–20)
CO2: 31 mmol/L (ref 22–32)
CREATININE: 1.04 mg/dL (ref 0.61–1.24)
Calcium: 8 mg/dL — ABNORMAL LOW (ref 8.9–10.3)
Chloride: 99 mmol/L — ABNORMAL LOW (ref 101–111)
GFR calc Af Amer: >60 mL/min (ref >60)
GFR calc non Af Amer: >60 mL/min (ref >60)
GLUCOSE: 98 mg/dL (ref 65–99)
Potassium: 3.1 mmol/L — ABNORMAL LOW (ref 3.5–5.1)
SODIUM: 139 mmol/L (ref 135–145)

## 2014-08-09 LAB — CBC
HEMATOCRIT: 33 % — AB (ref 39.0–52.0)
Hemoglobin: 10.9 g/dL — ABNORMAL LOW (ref 13.0–17.0)
MCH: 30.4 pg (ref 26.0–34.0)
MCHC: 33 g/dL (ref 30.0–36.0)
MCV: 91.9 fL (ref 78.0–100.0)
Platelets: 254 10*3/uL (ref 150–400)
RBC: 3.59 MIL/uL — ABNORMAL LOW (ref 4.22–5.81)
RDW: 15.3 % (ref 11.5–15.5)
WBC: 8.9 10*3/uL (ref 4.0–10.5)

## 2014-08-09 LAB — MAGNESIUM: MAGNESIUM: 2 mg/dL (ref 1.7–2.4)

## 2014-08-09 LAB — GLUCOSE, CAPILLARY
GLUCOSE-CAPILLARY: 89 mg/dL (ref 65–99)
Glucose-Capillary: 103 mg/dL — ABNORMAL HIGH (ref 65–99)
Glucose-Capillary: 85 mg/dL (ref 65–99)
Glucose-Capillary: 85 mg/dL (ref 65–99)
Glucose-Capillary: 91 mg/dL (ref 65–99)
Glucose-Capillary: 94 mg/dL (ref 65–99)

## 2014-08-09 MED ORDER — POTASSIUM CHLORIDE 10 MEQ/100ML IV SOLN
10.0000 meq | INTRAVENOUS | Status: AC
  Administered 2014-08-09 (×5): 10 meq via INTRAVENOUS
  Filled 2014-08-09 (×5): qty 100

## 2014-08-09 MED ORDER — HEPARIN SODIUM (PORCINE) 5000 UNIT/ML IJ SOLN
5000.0000 [IU] | Freq: Three times a day (TID) | INTRAMUSCULAR | Status: DC
  Administered 2014-08-09 – 2014-08-12 (×9): 5000 [IU] via SUBCUTANEOUS
  Filled 2014-08-09 (×10): qty 1

## 2014-08-09 NOTE — Progress Notes (Signed)
[facility] PROGRESS NOTE Interim History: 59 year old male with past medical history of CABG/PCI last one in 2006 with ischemic cardiomyopathy with an EF of 40-45% on 9 2015 laryngeal cancer, on radiation therapy, his oncologist on his last admission recommended no further chemotherapy, who developed allergic reaction to first treatment with cetuximab with cardiac arrest and had to be intubated. Recently discharged from [facility] 08/04/2014 readmitted on the same day for pneumoperitoneum and acute cardiac ischemia. Place on ventilatory support status post extubation.   Assessment/Plan: Acute respiratory failure in a patient with a history of COPD and head and neck squamous cancer in setting of acute bowel perforation: Status post extubation and 08/05/2014 No nasal cannula his saturations remaining above 90%.  Acute Diverticular perforation/peritonitis: CT scan of the abdomen and pelvis on 7/22 that showed pneumoperitoneum. Surgery was consulted Status post exploratory laparotomy on 08/05/2014 with a Hartmann procedure. With NG tube in place with large amounts, ostomy starting to work. BCx2 and UC 7.22>>> Started on vanc and zosyn emprically on admisison  Elevated troponins with ST segment depression in a patient with a history of   CAD-hx of CABG '06, DES LCX 01/2013, DES SVG-LAD 5/21/5 at [facility]: In the setting of shock with bowel perforation. With EKG showing ST segment depression with mild elevated troponin and he has since chest pain. Likely demand ischemia. Cardiology was consulted, recommended to continue beta blocker. Patient is currently nothing by mouth. Started directly on IV Zosyn, will need a CT angiogram abdomen and pelvis in the future.  Hypomagnesemia: - monitor keep Mag > 2.0.  ETOH abuse: D/c CIWA no sign of withdrawals.    Code Status: full Family Communication: none  Disposition Plan: home per  surgery   Consultants:  Surgery  Cardiology  Procedures:  CT abd and pelvis  Antibiotics:  vanc and zosyn 7.22.2016 >>>  HPI/Subjective: Tolerating clear liq diet  Objective: Filed Vitals:   08/08/14 1530 08/08/14 2000 08/09/14 0425 08/09/14 0749  BP:  129/68 129/62 146/65  Pulse: 86 66 69 87  Temp:  98.5 F (36.9 C) 98.8 F (37.1 C)   TempSrc:  Oral Oral   Resp: 14     Height:      Weight:      SpO2: 87% 96% 95%     Intake/Output Summary (Last 24 hours) at 08/09/14 0849 Last data filed at 08/09/14 0606  Gross per 24 hour  Intake    850 ml  Output   1170 ml  Net   -320 ml   Filed Weights   08/06/14 0500 08/07/14 0500 08/08/14 0527  Weight: 86.8 kg (191 lb 5.8 oz) 87 kg (191 lb 12.8 oz) 83.371 kg (183 lb 12.8 oz)    Exam:  General: Alert, awake, oriented x3, in no acute distress.  HEENT: No bruits, no goiter.  Heart: Regular rate and rhythm. Lungs: Good air movement, cleae  Abdomen: Soft, nontender, nondistended, stoma in place with drainage. Neuro: Grossly intact, nonfocal.   Data Reviewed: Basic Metabolic Panel:  Recent Labs Lab 08/05/14 0115 08/05/14 1241 08/06/14 0418 08/07/14 0305 08/08/14 0434 08/08/14 1148 08/09/14 0517  NA 136 138 135 139 140  --  139  K 4.1 3.6 3.6 3.0* 3.0* 3.4* 3.1*  CL 103  --  104 105 98*  --  99*  CO2 26  --  26 27 33*  --  31  GLUCOSE 98  --  113* 94 90  --  98  BUN 18  --  10 12 12   --  12  CREATININE 1.07  --  1.04 1.03 1.11  --  1.04  CALCIUM 8.2*  --  7.5* 7.8* 8.0*  --  8.0*  MG 1.9  --   --   --   --  2.0 2.0  PHOS 2.4*  --   --   --   --   --   --    Liver Function Tests:  Recent Labs Lab 08/04/14 1950 08/05/14 0115  AST 40 38  ALT 43 41  ALKPHOS 73 71  BILITOT 0.2* 0.4  PROT 5.9* 5.8*  ALBUMIN 3.3* 3.1*    Recent Labs Lab 08/05/14 0115  LIPASE 12*   No results for input(s): AMMONIA in the last 168 hours. CBC:  Recent Labs Lab 08/02/14 0954  08/04/14 0353 08/04/14 1950  08/05/14 0115 08/05/14 1241 08/06/14 0418 08/07/14 0305 08/08/14 0434 08/09/14 0517  WBC 12.2*  < > 15.8* 17.4* 10.1  --  13.5* 12.7* 9.6 8.9  NEUTROABS 7.2  --  13.8* 15.0*  --   --   --   --  6.4  --   HGB 11.8*  < > 8.8* 10.1* 10.4* 8.2* 9.6* 9.5* 10.2* 10.9*  HCT 37.3*  < > 26.7* 30.6* 31.6* 24.0* 29.4* 28.4* 30.6* 33.0*  MCV 95.4  < > 93.7 93.3 93.5  --  90.7 91.3 91.6 91.9  PLT 324  < > 297 312 283  --  197 184 239 254  < > = values in this interval not displayed. Cardiac Enzymes:  Recent Labs Lab 08/05/14 0115 08/05/14 0800 08/05/14 1330  TROPONINI 1.54* 0.94* 0.60*   BNP (last 3 results)  Recent Labs  08/05/14 0115  BNP 780.2*    ProBNP (last 3 results) No results for input(s): PROBNP in the last 8760 hours.  CBG:  Recent Labs Lab 08/08/14 1645 08/08/14 2107 08/08/14 2353 08/09/14 0428 08/09/14 0731  GLUCAP 99 92 117* 89 94    Recent Results (from the past 240 hour(s))  MRSA PCR Screening     Status: None   Collection Time: 08/02/14 11:24 AM  Result Value Ref Range Status   MRSA by PCR NEGATIVE NEGATIVE Final    Comment:        The GeneXpert MRSA Assay (FDA approved for NASAL specimens only), is one component of a comprehensive MRSA colonization surveillance program. It is not intended to diagnose MRSA infection nor to guide or monitor treatment for MRSA infections.   Culture, blood (routine x 2)     Status: None (Preliminary result)   Collection Time: 08/05/14  1:15 AM  Result Value Ref Range Status   Specimen Description BLOOD LEFT ARM  Final   Special Requests BOTTLES DRAWN AEROBIC ONLY 10CC  Final   Culture   Final    NO GROWTH 3 DAYS Performed at [facility]    Report Status PENDING  Incomplete  Culture, blood (routine x 2)     Status: None (Preliminary result)   Collection Time: 08/05/14  1:20 AM  Result Value Ref Range Status   Specimen Description BLOOD RIGHT HAND  Final   Special Requests BOTTLES DRAWN AEROBIC AND  ANAEROBIC 10CC  Final   Culture   Final    NO GROWTH 3 DAYS Performed at [facility]    Report Status PENDING  Incomplete  Surgical pcr screen     Status: None   Collection Time: 08/05/14  4:21 AM  Result Value Ref  Range Status   MRSA, PCR NEGATIVE NEGATIVE Final   Staphylococcus aureus NEGATIVE NEGATIVE Final    Comment:        The Xpert SA Assay (FDA approved for NASAL specimens in patients over 62 years of age), is one component of a comprehensive surveillance program.  Test performance has been validated by [facility] for patients greater than or equal to 44 year old. It is not intended to diagnose infection nor to guide or monitor treatment.      Studies: No results found.  Scheduled Meds: . sodium chloride   Intravenous Once  . antiseptic oral rinse  7 mL Mouth Rinse q12n4p  . carvedilol  6.25 mg Oral BID WC  . chlorhexidine  15 mL Mouth Rinse BID  . fentaNYL  50 mcg Transdermal Q72H  . heparin subcutaneous  5,000 Units Subcutaneous 3 times per day  . insulin aspart  2-6 Units Subcutaneous 6 times per day  . piperacillin-tazobactam (ZOSYN)  IV  3.375 g Intravenous 3 times per day   Continuous Infusions:   Time Spent: 25 min   Charlynne Cousins  [facility] Pager [phone]. If 7PM-7AM, please contact night-coverage at [email or web address], password [facility] 08/09/2014, 8:49 AM  LOS: 4 days

## 2014-08-09 NOTE — Progress Notes (Signed)
Physical Therapy Treatment Patient Details Name: [name] MRN: [id number] DOB: [birth year] Today's Date: 08/09/2014    History of Present Illness Adm 08/04/14 with demand cardiac ischemia in setting of bowel perforation; Sigmoid colectomy with end colostomy PMHx-discharged 7/22 from [facility] due to cardiac arrest 7/20 (reaction to new med--cetuximab); CAD, cardiomyopathy with EF 10-15%    PT Comments    Patient tolerated increased physical activity and ambulation this session. Reinforced techniques for pain management and safety with mobility. Patient progressing well, will continue with current POC.  Follow Up Recommendations  No PT follow up;Supervision for mobility/OOB     Equipment Recommendations   (TBA)    Recommendations for Other Services       Precautions / Restrictions Precautions Precautions: Fall Precaution Comments:  (NG tube in place, JP drain right flank) Restrictions Weight Bearing Restrictions: No    Mobility  Bed Mobility Overal bed mobility: Needs Assistance;+ 2 for safety/equipment Bed Mobility: Rolling;Sidelying to Sit Rolling: Min assist Sidelying to sit: Min assist       General bed mobility comments: Min assist for mobility with HOB elevated 30 degrees, UE support to pull to upright and min assist with chuck pad to scoot to EOB  Transfers Overall transfer level: Needs assistance Equipment used: None Transfers: Sit to/from Stand Sit to Stand: Min guard         General transfer comment: VCs for hand placement min guard for stability with elevation to standing  Ambulation/Gait Ambulation/Gait assistance: Min guard Ambulation Distance (Feet): 70 Feet Assistive device: Rolling walker (2 wheeled) Gait Pattern/deviations: Step-through pattern;Decreased stride length;Shuffle Gait velocity: decreased Gait velocity interpretation: Below normal speed for age/gender General Gait Details: very slow and cautious with ambulation, no improvements in  cadence despite cues. VCs for upright posture and safety with use of RW. OVerall good stability despite slow gait   Stairs            Wheelchair Mobility    Modified Rankin (Stroke Patients Only)       Balance           Standing balance support: No upper extremity supported Standing balance-Leahy Scale: Fair (patient was able to stand and perform toileting and self car)                      Cognition Arousal/Alertness: Awake/alert Behavior During Therapy: WFL for tasks assessed/performed Overall Cognitive Status: Within Functional Limits for tasks assessed                      Exercises      General Comments General comments (skin integrity, edema, etc.): Reinforced education of splinting for support of abdomnial area. Patient able to performed self care and hygiene as well as personal care functional tasks with min guard/supervision.       Pertinent Vitals/Pain Pain Assessment: 0-10 Pain Score: 2  (2 at rest, 6 with activity) Pain Location: abdominal area Pain Descriptors / Indicators: Discomfort;Sore Pain Intervention(s): Limited activity within patient's tolerance;Monitored during session;Repositioned    Home Living                      Prior Function            PT Goals (current goals can now be found in the care plan section) Acute Rehab PT Goals Patient Stated Goal: return to independent PT Goal Formulation: With patient Time For Goal Achievement: 08/15/14 Potential to Achieve Goals: Good Progress towards  PT goals: Progressing toward goals    Frequency  Min 3X/week    PT Plan Current plan remains appropriate    Co-evaluation             End of Session   Activity Tolerance: Patient tolerated treatment well Patient left: in chair;with call bell/phone within reach     Time: 1950-9326 PT Time Calculation (min) (ACUTE ONLY): 25 min  Charges:  $Gait Training: 8-22 mins $Therapeutic Activity: 8-22 mins                     G CodesDuncan Dull 2014-08-20, 9:22 AM Alben Deeds, PT DPT  [phone]

## 2014-08-09 NOTE — Progress Notes (Signed)
Patient ID: [name], male   DOB: 1955-10-18, 59 y.o.   MRN: [id number]     [facility]      [address] [place], [place] [address]    Phone: [phone] FAX: [phone]     Subjective: No n/v.  Thirsty.  VSS.  Afebrile.    Objective:  Vital signs:  Filed Vitals:   08/08/14 1530 08/08/14 2000 08/09/14 0425 08/09/14 0749  BP:  129/68 129/62 146/65  Pulse: 86 66 69 87  Temp:  98.5 F (36.9 C) 98.8 F (37.1 C)   TempSrc:  Oral Oral   Resp: 14     Height:      Weight:      SpO2: 87% 96% 95%     Last BM Date: 08/03/14  Intake/Output   Yesterday:  07/26 0701 - 07/27 0700 In: 989.3 [P.O.:120; I.V.:29.3; NG/GT:240; IV Piggyback:600] Out: 1170 [Urine:875; Emesis/NG output:240; Drains:55] This shift:    I/O last 3 completed shifts: In: 2350 [P.O.:480; I.V.:540; NG/GT:330; IV Piggyback:1000] Out: 3150 [Urine:1675; Emesis/NG output:1340; Drains:115; Stool:20]     Physical Exam: General: Pt awake/alert/oriented x4 in no acute distress Abdomen: Soft. Nondistended. +bs. Wound is clean, fascia is intact, repacked. RLQ drain with serous output. LLQ stoma is pink and viable, stool noted. No evidence of peritonitis. No incarcerated hernias.   Problem List:   Principal Problem:   NSTEMI (non-ST elevated myocardial infarction) Active Problems:   CAD-hx of CABG '06, DES LCX 01/2013, DES SVG[facility]LAD 5/21/5 at [facility]   PAD (peripheral artery disease)   Bowel perforation   Troponin level elevated   Other emphysema   Cardiac ischemia   Perforated bowel   Alcohol abuse   Peritonitis   Hypokalemia   Hypomagnesemia    Results:   Labs: Results for orders placed or performed during the hospital encounter of 08/04/14 (from the past 48 hour(s))  Glucose, capillary     Status: None   Collection Time: 08/07/14 11:44 AM  Result Value Ref Range   Glucose-Capillary 86 65 - 99 mg/dL   Comment 1 Notify RN   Glucose,  capillary     Status: None   Collection Time: 08/07/14  3:43 PM  Result Value Ref Range   Glucose-Capillary 81 65 - 99 mg/dL   Comment 1 Notify RN   Glucose, capillary     Status: None   Collection Time: 08/07/14  7:30 PM  Result Value Ref Range   Glucose-Capillary 78 65 - 99 mg/dL   Comment 1 Notify RN   Glucose, capillary     Status: None   Collection Time: 08/07/14 11:40 PM  Result Value Ref Range   Glucose-Capillary 83 65 - 99 mg/dL   Comment 1 Document in Chart   Glucose, capillary     Status: None   Collection Time: 08/08/14  3:37 AM  Result Value Ref Range   Glucose-Capillary 84 65 - 99 mg/dL   Comment 1 Notify RN   Basic metabolic panel     Status: Abnormal   Collection Time: 08/08/14  4:34 AM  Result Value Ref Range   Sodium 140 135 - 145 mmol/L   Potassium 3.0 (L) 3.5 - 5.1 mmol/L   Chloride 98 (L) 101 - 111 mmol/L   CO2 33 (H) 22 - 32 mmol/L   Glucose, Bld 90 65 - 99 mg/dL   BUN 12 6 - 20 mg/dL   Creatinine, Ser 1.11 0.61 - 1.24 mg/dL  Calcium 8.0 (L) 8.9 - 10.3 mg/dL   GFR calc non Af Amer >60 >60 mL/min   GFR calc Af Amer >60 >60 mL/min    Comment: (NOTE) The eGFR has been calculated using the CKD EPI equation. This calculation has not been validated in all clinical situations. eGFR's persistently <60 mL/min signify possible Chronic Kidney Disease.    Anion gap 9 5 - 15  CBC with Differential/Platelet     Status: Abnormal   Collection Time: 08/08/14  4:34 AM  Result Value Ref Range   WBC 9.6 4.0 - 10.5 K/uL   RBC 3.34 (L) 4.22 - 5.81 MIL/uL   Hemoglobin 10.2 (L) 13.0 - 17.0 g/dL   HCT 30.6 (L) 39.0 - 52.0 %   MCV 91.6 78.0 - 100.0 fL   MCH 30.5 26.0 - 34.0 pg   MCHC 33.3 30.0 - 36.0 g/dL   RDW 15.3 11.5 - 15.5 %   Platelets 239 150 - 400 K/uL   Neutrophils Relative % 66 43 - 77 %   Neutro Abs 6.4 1.7 - 7.7 K/uL   Lymphocytes Relative 19 12 - 46 %   Lymphs Abs 1.8 0.7 - 4.0 K/uL   Monocytes Relative 10 3 - 12 %   Monocytes Absolute 0.9 0.1 - 1.0  K/uL   Eosinophils Relative 5 0 - 5 %   Eosinophils Absolute 0.5 0.0 - 0.7 K/uL   Basophils Relative 0 0 - 1 %   Basophils Absolute 0.0 0.0 - 0.1 K/uL  Glucose, capillary     Status: None   Collection Time: 08/08/14  7:34 AM  Result Value Ref Range   Glucose-Capillary 74 65 - 99 mg/dL   Comment 1 Capillary Specimen    Comment 2 Notify RN   Glucose, capillary     Status: Abnormal   Collection Time: 08/08/14 11:39 AM  Result Value Ref Range   Glucose-Capillary 105 (H) 65 - 99 mg/dL   Comment 1 Capillary Specimen    Comment 2 Notify RN   Potassium     Status: Abnormal   Collection Time: 08/08/14 11:48 AM  Result Value Ref Range   Potassium 3.4 (L) 3.5 - 5.1 mmol/L  Magnesium     Status: None   Collection Time: 08/08/14 11:48 AM  Result Value Ref Range   Magnesium 2.0 1.7 - 2.4 mg/dL  Glucose, capillary     Status: None   Collection Time: 08/08/14  4:45 PM  Result Value Ref Range   Glucose-Capillary 99 65 - 99 mg/dL  Glucose, capillary     Status: None   Collection Time: 08/08/14  9:07 PM  Result Value Ref Range   Glucose-Capillary 92 65 - 99 mg/dL  Glucose, capillary     Status: Abnormal   Collection Time: 08/08/14 11:53 PM  Result Value Ref Range   Glucose-Capillary 117 (H) 65 - 99 mg/dL  Glucose, capillary     Status: None   Collection Time: 08/09/14  4:28 AM  Result Value Ref Range   Glucose-Capillary 89 65 - 99 mg/dL  Basic metabolic panel     Status: Abnormal   Collection Time: 08/09/14  5:17 AM  Result Value Ref Range   Sodium 139 135 - 145 mmol/L   Potassium 3.1 (L) 3.5 - 5.1 mmol/L   Chloride 99 (L) 101 - 111 mmol/L   CO2 31 22 - 32 mmol/L   Glucose, Bld 98 65 - 99 mg/dL   BUN 12 6 - 20 mg/dL  Creatinine, Ser 1.04 0.61 - 1.24 mg/dL   Calcium 8.0 (L) 8.9 - 10.3 mg/dL   GFR calc non Af Amer >60 >60 mL/min   GFR calc Af Amer >60 >60 mL/min    Comment: (NOTE) The eGFR has been calculated using the CKD EPI equation. This calculation has not been validated in  all clinical situations. eGFR's persistently <60 mL/min signify possible Chronic Kidney Disease.    Anion gap 9 5 - 15  CBC     Status: Abnormal   Collection Time: 08/09/14  5:17 AM  Result Value Ref Range   WBC 8.9 4.0 - 10.5 K/uL   RBC 3.59 (L) 4.22 - 5.81 MIL/uL   Hemoglobin 10.9 (L) 13.0 - 17.0 g/dL   HCT 33.0 (L) 39.0 - 52.0 %   MCV 91.9 78.0 - 100.0 fL   MCH 30.4 26.0 - 34.0 pg   MCHC 33.0 30.0 - 36.0 g/dL   RDW 15.3 11.5 - 15.5 %   Platelets 254 150 - 400 K/uL  Magnesium     Status: None   Collection Time: 08/09/14  5:17 AM  Result Value Ref Range   Magnesium 2.0 1.7 - 2.4 mg/dL  Glucose, capillary     Status: None   Collection Time: 08/09/14  7:31 AM  Result Value Ref Range   Glucose-Capillary 94 65 - 99 mg/dL    Imaging / Studies: No results found.  Medications / Allergies:  Scheduled Meds: . sodium chloride   Intravenous Once  . antiseptic oral rinse  7 mL Mouth Rinse q12n4p  . carvedilol  6.25 mg Oral BID WC  . chlorhexidine  15 mL Mouth Rinse BID  . fentaNYL  50 mcg Transdermal Q72H  . insulin aspart  2-6 Units Subcutaneous 6 times per day  . piperacillin-tazobactam (ZOSYN)  IV  3.375 g Intravenous 3 times per day   Continuous Infusions:  PRN Meds:.sodium chloride, ALPRAZolam, fentaNYL (SUBLIMAZE) injection, HYDROmorphone (DILAUDID) injection, morphine injection  Antibiotics: Anti-infectives    Start     Dose/Rate Route Frequency Ordered Stop   08/05/14 1000  vancomycin (VANCOCIN) IVPB 1000 mg/200 mL premix  Status:  Discontinued     1,000 mg 200 mL/hr over 60 Minutes Intravenous Every 12 hours 08/05/14 0102 08/09/14 0828   08/05/14 0600  piperacillin-tazobactam (ZOSYN) IVPB 3.375 g     3.375 g 12.5 mL/hr over 240 Minutes Intravenous 3 times per day 08/05/14 0054     08/05/14 0045  vancomycin (VANCOCIN) IVPB 1000 mg/200 mL premix  Status:  Discontinued     1,000 mg 200 mL/hr over 60 Minutes Intravenous Every 12 hours 08/05/14 0038 08/05/14 0053    08/05/14 0045  piperacillin-tazobactam (ZOSYN) IVPB 3.375 g  Status:  Discontinued     3.375 g 100 mL/hr over 30 Minutes Intravenous 3 times per day 08/05/14 0038 08/05/14 0053   08/04/14 2145  piperacillin-tazobactam (ZOSYN) IVPB 3.375 g     3.375 g 12.5 mL/hr over 240 Minutes Intravenous  Once 08/04/14 2142 08/05/14 0207   08/04/14 2145  vancomycin (VANCOCIN) IVPB 1000 mg/200 mL premix     1,000 mg 200 mL/hr over 60 Minutes Intravenous  Once 08/04/14 2142 08/05/14 0011       Assessment/Plan Perforated sigmoid diverticulitis  POD#4 exploratory laparotomy, sigmoid colectomy with end colostomy---Dr. [name]  -surgically stable, resolving ileus -clamp NGT and allow for liquids.   -continue with drain(56m serous) -BID wet to dry dressing changes -WOC consult for new ostomy teaching -IS, mobilize/up to chair as  tolerated, PT eval ID-zosyn D#4/7 Vanc D#3/7---DC FEN-k supplement per medicine, IVF, pain control.  VTE prophylaxis-SCD, add heparin Dispo-continue inpatient  Erby Pian, [facility] Pager [phone](7A-4:30P) For consults and floor pages call [phone](7A-4:30P)  08/09/2014 8:31 AM

## 2014-08-09 NOTE — Progress Notes (Signed)
[name]   DOB:Jan 04, 1956   QV#:[id number]   PPI#:[id number]  Patient Care Team: [name], NP as PCP - General [name], RN as Oncology Nurse Navigator [name], MD as Consulting Physician (Radiation Oncology) [name], MD as Consulting Physician (Hematology and Oncology) [name], RD as Dietitian (Nutrition)  I have seen the patient, examined him and edited the notes as follows  Subjective: Patient seen and examined. Events since 7/22 noted.He was readmitted on the same day of discharge due to acute diverticular perforation, elevated troponins. He underwent emergent exploratory laparotomy, sigmoid colectomy with end colostomy by Dr. [name] . He is recuperating well.  He is awake, alert, conversant. He reports feeling better. Denies fevers, chills, night sweats, vision changes, or mucositis. Denies any respiratory complaints. Denies any chest pain or palpitations. Denies lower extremity swelling. Denies nausea or vomiting.  He has an NG tube functioning well. Denies heartburn or change in bowel habits. He has an ostomy bag working properly.  Denies any dysuria. Denies abnormal skin rashes, or neuropathy. Pain is well controlled with current regimen. Denies any bleeding issues such as epistaxis, hematemesis, hematuria or hematochezia.    Scheduled Meds: . sodium chloride   Intravenous Once  . antiseptic oral rinse  7 mL Mouth Rinse q12n4p  . carvedilol  6.25 mg Oral BID WC  . chlorhexidine  15 mL Mouth Rinse BID  . fentaNYL  50 mcg Transdermal Q72H  . heparin subcutaneous  5,000 Units Subcutaneous 3 times per day  . insulin aspart  2-6 Units Subcutaneous 6 times per day  . piperacillin-tazobactam (ZOSYN)  IV  3.375 g Intravenous 3 times per day   Continuous Infusions:   PRN Meds:.sodium chloride, ALPRAZolam, fentaNYL (SUBLIMAZE) injection, HYDROmorphone (DILAUDID) injection, morphine injection  Objective:  Filed Vitals:   08/09/14 1400  BP: 133/67   Pulse: 73  Temp: 98 F (36.7 C)  Resp: 16      Intake/Output Summary (Last 24 hours) at 08/09/14 1547 Last data filed at 08/09/14 0606  Gross per 24 hour  Intake    390 ml  Output    570 ml  Net   -180 ml    GENERAL: Awake, alert, comfortable SKIN: skin color, texture, turgor are pale, no rashes or significant lesions. Several tattoos EYES: normal, conjunctiva are pink and non-injected, sclera clear OROPHARYNX: clear, no thrush or mucositis noted. NECK: supple, thyroid normal size, non-tender, without nodularity LYMPH: no palpable lymphadenopathy in the cervical, axillary or inguinal LUNGS:essentially clear to auscultation.  Good air movement.  HEART: regular rate & rhythm and no murmurs and no lower extremity edema. Right port a cath present, non tender. ABDOMEN: soft, ostomy area non tender. Right lower quadrant drain with serosanguineous fluid.  Musculoskeletal:no clubbing or cyanosis. PSYCH: alert & oriented, conversant. NEURO: no focal motor/sensory deficits    CBG (last 3)   Recent Labs  08/09/14 0428 08/09/14 0731 08/09/14 1127  GLUCAP 89 94 103*     Labs:   Recent Labs Lab 08/04/14 0353 08/04/14 1950 08/05/14 0115 08/05/14 1241 08/06/14 0418 08/07/14 0305 08/08/14 0434 08/09/14 0517  WBC 15.8* 17.4* 10.1  --  13.5* 12.7* 9.6 8.9  HGB 8.8* 10.1* 10.4* 8.2* 9.6* 9.5* 10.2* 10.9*  HCT 26.7* 30.6* 31.6* 24.0* 29.4* 28.4* 30.6* 33.0*  PLT 297 312 283  --  197 184 239 254  MCV 93.7 93.3 93.5  --  90.7 91.3 91.6 91.9  MCH 30.9 30.8 30.8  --  29.6 30.5 30.5  30.4  MCHC 33.0 33.0 32.9  --  32.7 33.5 33.3 33.0  RDW 15.2 15.0 15.2  --  16.2* 15.9* 15.3 15.3  LYMPHSABS 1.1 1.2  --   --   --   --  1.8  --   MONOABS 1.0 1.2*  --   --   --   --  0.9  --   EOSABS 0.0 0.0  --   --   --   --  0.5  --   BASOSABS 0.0 0.0  --   --   --   --  0.0  --      Chemistries:    Recent Labs Lab 08/04/14 1950 08/05/14 0115 08/05/14 1241 08/06/14 0418  08/07/14 0305 08/08/14 0434 08/08/14 1148 08/09/14 0517  NA 138 136 138 135 139 140  --  139  K 4.1 4.1 3.6 3.6 3.0* 3.0* 3.4* 3.1*  CL 103 103  --  104 105 98*  --  99*  CO2 24 26  --  26 27 33*  --  31  GLUCOSE 109* 98  --  113* 94 90  --  98  BUN 19 18  --  10 12 12   --  12  CREATININE 1.05 1.07  --  1.04 1.03 1.11  --  1.04  CALCIUM 8.6* 8.2*  --  7.5* 7.8* 8.0*  --  8.0*  MG  --  1.9  --   --   --   --  2.0 2.0  AST 40 38  --   --   --   --   --   --   ALT 43 41  --   --   --   --   --   --   ALKPHOS 73 71  --   --   --   --   --   --   BILITOT 0.2* 0.4  --   --   --   --   --   --    Pathology showed perforated diverticulitis without evidence of malignancy Assessment/Plan: 59 y.o.  Squamous Cell Carcinoma of the Larynx S/p simulation on 7/13 for Intensity Modulated Radiotherapy (IMRT) medically necessary for parotid sparing which results in a gain of quality of life by preservation of salivary gland function. In addition the tumor volume is in close proximity to the spinal cord.  He was to initiate C1 D1 concurrent chemo with cetuximab Suspected drug induced reaction, resulting in cardiac arrest, chemo discontinued He was intubated and resuscitated Radiation oncologist has been informed that we are not willing to proceed with further systemic treatment in the future, but may not be a candidate for radiation due to readmission with pneumoperitoneum and perforated viscous, requiring surgery, and colostomy  Perforated sigmoid diverticulitis S/p exploratory laparotomy, sigmoid colectomy with end colostomy on 08/05/14 His ostomy bag is functioning properly Surgically he is stable Appreciate surgery involvement.  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 Med regimen is adequate He will return to his prescribed outpatient regimen when discharged home.  DVT prophylaxis On heparin and mechanical devices  Full Code Other medical issues as per admitting team   Discharge planning As per admitting team.  Will follow as an  out-patient  Rondel Jumbo, PA-C 08/09/2014  3:47 PM Deondray Ospina, MD 08/09/2014

## 2014-08-09 NOTE — Care Management (Signed)
This Case Manager spoke with Jacqlyn Krauss, RN CM regarding patient. Patient uninsured; Edward Beals, NP listed as PCP. This Case Manager met with patient at bedside to determine if patient follows up with his PCP. Patient confirms that Edward Beals, NP is his PCP, and he is able to easily get appointments with her. He plans to follow-up with her after discharge. Jacqlyn Krauss, RN CM updated.

## 2014-08-10 ENCOUNTER — Ambulatory Visit: Payer: Medicaid Other

## 2014-08-10 DIAGNOSIS — Z510 Encounter for antineoplastic radiation therapy: Secondary | ICD-10-CM | POA: Diagnosis not present

## 2014-08-10 LAB — BASIC METABOLIC PANEL
Anion gap: 8 (ref 5–15)
BUN: 11 mg/dL (ref 6–20)
CO2: 28 mmol/L (ref 22–32)
Calcium: 7.9 mg/dL — ABNORMAL LOW (ref 8.9–10.3)
Chloride: 102 mmol/L (ref 101–111)
Creatinine, Ser: 0.98 mg/dL (ref 0.61–1.24)
Glucose, Bld: 87 mg/dL (ref 65–99)
POTASSIUM: 3.6 mmol/L (ref 3.5–5.1)
Sodium: 138 mmol/L (ref 135–145)

## 2014-08-10 LAB — CULTURE, BLOOD (ROUTINE X 2)
Culture: NO GROWTH
Culture: NO GROWTH

## 2014-08-10 LAB — GLUCOSE, CAPILLARY
GLUCOSE-CAPILLARY: 90 mg/dL (ref 65–99)
GLUCOSE-CAPILLARY: 88 mg/dL (ref 65–99)
Glucose-Capillary: 77 mg/dL (ref 65–99)
Glucose-Capillary: 102 mg/dL — ABNORMAL HIGH (ref 65–99)
Glucose-Capillary: 96 mg/dL (ref 65–99)

## 2014-08-10 LAB — MAGNESIUM: Magnesium: 2 mg/dL (ref 1.7–2.4)

## 2014-08-10 MED ORDER — POTASSIUM CHLORIDE 10 MEQ/100ML IV SOLN
10.0000 meq | INTRAVENOUS | Status: AC
  Administered 2014-08-10 (×5): 10 meq via INTRAVENOUS
  Filled 2014-08-10 (×5): qty 100

## 2014-08-10 MED ORDER — SODIUM CHLORIDE 0.9 % IJ SOLN
10.0000 mL | INTRAMUSCULAR | Status: DC | PRN
  Administered 2014-08-12: 10 mL
  Filled 2014-08-10: qty 40

## 2014-08-10 MED ORDER — OXYCODONE HCL 5 MG PO TABS
5.0000 mg | ORAL_TABLET | ORAL | Status: DC | PRN
  Administered 2014-08-10 (×2): 10 mg via ORAL
  Administered 2014-08-11: 5 mg via ORAL
  Administered 2014-08-11: 10 mg via ORAL
  Administered 2014-08-11: 5 mg via ORAL
  Administered 2014-08-12: 10 mg via ORAL
  Filled 2014-08-10: qty 2
  Filled 2014-08-10: qty 1
  Filled 2014-08-10 (×4): qty 2

## 2014-08-10 NOTE — Consult Note (Addendum)
WOC wound follow up Wound type: CCS following for assessment and plan of care to abd wound.  Requested to apply Vac to midline abd wound. Full thickness post-op wound. Measurement: 16X6X3cm Wound bed: beefy red wound interspersed with yellow adipose tissue. Drainage (amount, consistency, odor) Scant amt pink drainage, no odor Periwound: Intact skin surrounding Dressing procedure/placement/frequency: Applied one piece black foam to 150mm cont suction.  Pt tolerated with minimal discomfort. Plan for bedside nurse to change Q Tues/Thurs/Sat. WOC ostomy follow up Stoma type/location:  Colostomy to LLQ from surgery on 7/23 Stomal assessment/size: Stoma 95% red, 5% dark red to edge from 12:00 o'clock to 2:00 o'clock.  Stoma slightly above skin level, 11/2 inches Peristomal assessment: Red macerated skin surrounding to 2 cm from previous pouch which was leaking behind wafer. Output: Mod amt liquid brown stool, large amt flatus. Ostomy pouching: 1pc with barrier ring.  Education provided:  Applied one piece pouch with barrier ring to maintain seal.  Pt watched and asked approp questions and wife at bedside assisted with procedure.  Both are able to open and close velcro to empty.  Reviewed pouching routines and ordering supplies. Educational materials and extra pouches left at bedside. Enrolled patient in [place] Start Discharge program: [place] MSN, RN, [place], [place], [place]

## 2014-08-10 NOTE — Progress Notes (Signed)
[facility] PROGRESS NOTE Interim History: 59 year old male with past medical history of CABG/PCI last one in 2006 with ischemic cardiomyopathy with an EF of 40-45% on 9 2015 laryngeal cancer, on radiation therapy, his oncologist on his last admission recommended no further chemotherapy, who developed allergic reaction to first treatment with cetuximab with cardiac arrest and had to be intubated. Recently discharged from [facility] 08/04/2014 readmitted on the same day for pneumoperitoneum and acute cardiac ischemia. Place on ventilatory support status post extubation.   Assessment/Plan: Acute respiratory failure in a patient with a history of COPD and head and neck squamous cancer in setting of acute bowel perforation: Status post extubation and 08/05/2014 No nasal cannula his saturations remaining above 90%.  Acute Diverticular perforation/peritonitis: CT scan of the abdomen and pelvis on 7/22 that showed pneumoperitoneum. Surgery was consulted Status post exploratory laparotomy on 08/05/2014 with a Hartmann procedure. DC NG tube ostomy working his diet was advanced. BCx2 and UC 7.22>>> Started on vanc and zosyn emprically on admission, question to surgery which antibiotic choice change in to, for home regimen.  Elevated troponins with ST segment depression in a patient with a history of   CAD-hx of CABG '06, DES LCX 01/2013, DES SVG-LAD 5/21/5 at [facility]: In the setting of shock with bowel perforation. With EKG showing ST segment depression with mild elevated troponin and he has since chest pain. Likely demand ischemia. Cardiology was consulted, recommended to continue beta blocker. Patient is currently nothing by mouth. Started  on IV Zosyn.  Hypomagnesemia/hypokalemia: - monitor keep Mag > 2.0, keep potassium greater than 4  ETOH abuse: D/c CIWA no sign of withdrawals.    Code Status: full Family Communication: none  Disposition Plan: home per  surgery   Consultants:  Surgery  Cardiology  Procedures:  CT abd and pelvis  Antibiotics:  vanc and zosyn 7.22.2016 >>>  HPI/Subjective: Tolerating clear liq diet, no complains.  Objective: Filed Vitals:   08/09/14 2046 08/10/14 0511 08/10/14 0526 08/10/14 0750  BP: 115/58  131/62 123/63  Pulse: 72  94 74  Temp: 98.8 F (37.1 C)  98.3 F (36.8 C)   TempSrc: Oral  Oral   Resp: 16  16   Height:      Weight:  81.693 kg (180 lb 1.6 oz)    SpO2: 95%  97%     Intake/Output Summary (Last 24 hours) at 08/10/14 1046 Last data filed at 08/10/14 0701  Gross per 24 hour  Intake    355 ml  Output    550 ml  Net   -195 ml   Filed Weights   08/07/14 0500 08/08/14 0527 08/10/14 0511  Weight: 87 kg (191 lb 12.8 oz) 83.371 kg (183 lb 12.8 oz) 81.693 kg (180 lb 1.6 oz)    Exam:  General: Alert, awake, oriented x3, in no acute distress.  HEENT: No bruits, no goiter.  Heart: Regular rate and rhythm. Lungs: Good air movement, cleae  Abdomen: Soft, nontender, nondistended, stoma in place with drainage. Neuro: Grossly intact, nonfocal.   Data Reviewed: Basic Metabolic Panel:  Recent Labs Lab 08/05/14 0115  08/06/14 0418 08/07/14 0305 08/08/14 0434 08/08/14 1148 08/09/14 0517 08/10/14 0350  NA 136  < > 135 139 140  --  139 138  K 4.1  < > 3.6 3.0* 3.0* 3.4* 3.1* 3.6  CL 103  --  104 105 98*  --  99* 102  CO2 26  --  26 27 33*  --  31 28  GLUCOSE 98  --  113* 94 90  --  98 87  BUN 18  --  10 12 12   --  12 11  CREATININE 1.07  --  1.04 1.03 1.11  --  1.04 0.98  CALCIUM 8.2*  --  7.5* 7.8* 8.0*  --  8.0* 7.9*  MG 1.9  --   --   --   --  2.0 2.0 2.0  PHOS 2.4*  --   --   --   --   --   --   --   < > = values in this interval not displayed. Liver Function Tests:  Recent Labs Lab 08/04/14 1950 08/05/14 0115  AST 40 38  ALT 43 41  ALKPHOS 73 71  BILITOT 0.2* 0.4  PROT 5.9* 5.8*  ALBUMIN 3.3* 3.1*    Recent Labs Lab 08/05/14 0115  LIPASE 12*   No  results for input(s): AMMONIA in the last 168 hours. CBC:  Recent Labs Lab 08/04/14 0353 08/04/14 1950 08/05/14 0115 08/05/14 1241 08/06/14 0418 08/07/14 0305 08/08/14 0434 08/09/14 0517  WBC 15.8* 17.4* 10.1  --  13.5* 12.7* 9.6 8.9  NEUTROABS 13.8* 15.0*  --   --   --   --  6.4  --   HGB 8.8* 10.1* 10.4* 8.2* 9.6* 9.5* 10.2* 10.9*  HCT 26.7* 30.6* 31.6* 24.0* 29.4* 28.4* 30.6* 33.0*  MCV 93.7 93.3 93.5  --  90.7 91.3 91.6 91.9  PLT 297 312 283  --  197 184 239 254   Cardiac Enzymes:  Recent Labs Lab 08/05/14 0115 08/05/14 0800 08/05/14 1330  TROPONINI 1.54* 0.94* 0.60*   BNP (last 3 results)  Recent Labs  08/05/14 0115  BNP 780.2*    ProBNP (last 3 results) No results for input(s): PROBNP in the last 8760 hours.  CBG:  Recent Labs Lab 08/09/14 1625 08/09/14 2103 08/09/14 2347 08/10/14 0514 08/10/14 0726  GLUCAP 85 85 91 77 90    Recent Results (from the past 240 hour(s))  MRSA PCR Screening     Status: None   Collection Time: 08/02/14 11:24 AM  Result Value Ref Range Status   MRSA by PCR NEGATIVE NEGATIVE Final    Comment:        The GeneXpert MRSA Assay (FDA approved for NASAL specimens only), is one component of a comprehensive MRSA colonization surveillance program. It is not intended to diagnose MRSA infection nor to guide or monitor treatment for MRSA infections.   Culture, blood (routine x 2)     Status: None (Preliminary result)   Collection Time: 08/05/14  1:15 AM  Result Value Ref Range Status   Specimen Description BLOOD LEFT ARM  Final   Special Requests BOTTLES DRAWN AEROBIC ONLY 10CC  Final   Culture   Final    NO GROWTH 4 DAYS Performed at [facility]    Report Status PENDING  Incomplete  Culture, blood (routine x 2)     Status: None (Preliminary result)   Collection Time: 08/05/14  1:20 AM  Result Value Ref Range Status   Specimen Description BLOOD RIGHT HAND  Final   Special Requests BOTTLES DRAWN AEROBIC AND  ANAEROBIC 10CC  Final   Culture   Final    NO GROWTH 4 DAYS Performed at [facility]    Report Status PENDING  Incomplete  Surgical pcr screen     Status: None   Collection Time: 08/05/14  4:21 AM  Result  Value Ref Range Status   MRSA, PCR NEGATIVE NEGATIVE Final   Staphylococcus aureus NEGATIVE NEGATIVE Final    Comment:        The Xpert SA Assay (FDA approved for NASAL specimens in patients over 45 years of age), is one component of a comprehensive surveillance program.  Test performance has been validated by [facility] for patients greater than or equal to 80 year old. It is not intended to diagnose infection nor to guide or monitor treatment.      Studies: No results found.  Scheduled Meds: . sodium chloride   Intravenous Once  . antiseptic oral rinse  7 mL Mouth Rinse q12n4p  . carvedilol  6.25 mg Oral BID WC  . chlorhexidine  15 mL Mouth Rinse BID  . fentaNYL  50 mcg Transdermal Q72H  . heparin subcutaneous  5,000 Units Subcutaneous 3 times per day  . insulin aspart  2-6 Units Subcutaneous 6 times per day  . piperacillin-tazobactam (ZOSYN)  IV  3.375 g Intravenous 3 times per day   Continuous Infusions:   Time Spent: 25 min   Charlynne Cousins  [facility] Pager [phone]. If 7PM-7AM, please contact night-coverage at [email or web address], password [facility] 08/10/2014, 10:46 AM  LOS: 5 days

## 2014-08-10 NOTE — Progress Notes (Signed)
[facility]/Kendry Pfarr Progress Note 5 Days Post-Op  Subjective: Patient looks great sitting up in bed.  No distress.  Ostomy is functioning well.  Objective: Vital signs in last 24 hours: Temp:  [98 F (36.7 C)-98.8 F (37.1 C)] 98.3 F (36.8 C) (07/28 0526) Pulse Rate:  [72-94] 74 (07/28 0750) Resp:  [16] 16 (07/28 0526) BP: (115-158)/(55-67) 123/63 mmHg (07/28 0750) SpO2:  [94 %-97 %] 97 % (07/28 0526) Weight:  [81.693 kg (180 lb 1.6 oz)] 81.693 kg (180 lb 1.6 oz) (07/28 0511) Last BM Date: 08/10/14 (patient has colostomy)  Intake/Output from previous day: 07/27 0701 - 07/28 0700 In: 342.5 [P.O.:240; IV Piggyback:62.5] Out: 550 [Urine:475; Drains:75] Intake/Output this shift: Total I/O In: 12.5 [IV Piggyback:12.5] Out: -   General: No acute distress  Lungs: Clear  Abd: Excellent bowel sounds.  Ostomy is pink and viable and working.  Midline wound is starting to granulate, and could possible be covered with negative pressure wound dressing.  Extremities: No changes  Neuro: Intact  Lab Results:  @LABLAST2 (wbc:2,hgb:2,hct:2,plt:2) BMET  Recent Labs  08/09/14 0517 08/10/14 0350  NA 139 138  K 3.1* 3.6  CL 99* 102  CO2 31 28  GLUCOSE 98 87  BUN 12 11  CREATININE 1.04 0.98  CALCIUM 8.0* 7.9*   PT/INR No results for input(s): LABPROT, INR in the last 72 hours. ABG No results for input(s): PHART, HCO3 in the last 72 hours.  Invalid input(s): PCO2, PO2  Studies/Results: No results found.  Anti-infectives: Anti-infectives    Start     Dose/Rate Route Frequency Ordered Stop   08/05/14 1000  vancomycin (VANCOCIN) IVPB 1000 mg/200 mL premix  Status:  Discontinued     1,000 mg 200 mL/hr over 60 Minutes Intravenous Every 12 hours 08/05/14 0102 08/09/14 0828   08/05/14 0600  piperacillin-tazobactam (ZOSYN) IVPB 3.375 g     3.375 g 12.5 mL/hr over 240 Minutes Intravenous 3 times per day 08/05/14 0054     08/05/14 0045  vancomycin (VANCOCIN) IVPB 1000 mg/200 mL premix   Status:  Discontinued     1,000 mg 200 mL/hr over 60 Minutes Intravenous Every 12 hours 08/05/14 0038 08/05/14 0053   08/05/14 0045  piperacillin-tazobactam (ZOSYN) IVPB 3.375 g  Status:  Discontinued     3.375 g 100 mL/hr over 30 Minutes Intravenous 3 times per day 08/05/14 0038 08/05/14 0053   08/04/14 2145  piperacillin-tazobactam (ZOSYN) IVPB 3.375 g     3.375 g 12.5 mL/hr over 240 Minutes Intravenous  Once 08/04/14 2142 08/05/14 0207   08/04/14 2145  vancomycin (VANCOCIN) IVPB 1000 mg/200 mL premix     1,000 mg 200 mL/hr over 60 Minutes Intravenous  Once 08/04/14 2142 08/05/14 0011      Assessment/Plan: s/p Procedure(s): EXPLORATORY LAPAROTOMY WITH SIGMOIDOTOMY WITH COLECTOMY & COLOSTOMY Advance diet VAC for midline wound to be considered.  LOS: 5 days   Kathryne Eriksson. Dahlia Bailiff, MD, FACS [phone] [facility] Surgery 08/10/2014

## 2014-08-11 ENCOUNTER — Ambulatory Visit: Payer: Medicaid Other

## 2014-08-11 LAB — GLUCOSE, CAPILLARY
GLUCOSE-CAPILLARY: 101 mg/dL — AB (ref 65–99)
GLUCOSE-CAPILLARY: 103 mg/dL — AB (ref 65–99)
GLUCOSE-CAPILLARY: 86 mg/dL (ref 65–99)
GLUCOSE-CAPILLARY: 91 mg/dL (ref 65–99)
GLUCOSE-CAPILLARY: 92 mg/dL (ref 65–99)
GLUCOSE-CAPILLARY: 92 mg/dL (ref 65–99)
Glucose-Capillary: 85 mg/dL (ref 65–99)

## 2014-08-11 LAB — BASIC METABOLIC PANEL
Anion gap: 6 (ref 5–15)
BUN: 9 mg/dL (ref 6–20)
CO2: 28 mmol/L (ref 22–32)
Calcium: 8 mg/dL — ABNORMAL LOW (ref 8.9–10.3)
Chloride: 99 mmol/L — ABNORMAL LOW (ref 101–111)
Creatinine, Ser: 1.04 mg/dL (ref 0.61–1.24)
GLUCOSE: 92 mg/dL (ref 65–99)
POTASSIUM: 3.9 mmol/L (ref 3.5–5.1)
SODIUM: 133 mmol/L — AB (ref 135–145)

## 2014-08-11 LAB — MAGNESIUM: Magnesium: 1.9 mg/dL (ref 1.7–2.4)

## 2014-08-11 MED ORDER — OXYCODONE HCL 5 MG PO TABS: 5.0000 mg | ORAL_TABLET | ORAL | Status: DC | PRN

## 2014-08-11 MED ORDER — FAMOTIDINE 20 MG PO TABS
20.0000 mg | ORAL_TABLET | Freq: Two times a day (BID) | ORAL | Status: DC
  Administered 2014-08-11 – 2014-08-12 (×2): 20 mg via ORAL
  Filled 2014-08-11 (×2): qty 1

## 2014-08-11 NOTE — Progress Notes (Signed)
Physical Therapy Treatment Patient Details Name: AUDIEL SCHEIBER MRN: [id number] DOB: Apr 18, 1955 Today's Date: August 19, 2014    History of Present Illness Adm 08/04/14 with demand cardiac ischemia in setting of bowel perforation; Sigmoid colectomy with end colostomy PMHx-discharged 7/22 from [facility] due to cardiac arrest 7/20 (reaction to new med--cetuximab); CAD, cardiomyopathy with EF 10-15%    PT Comments    Pt doing well with ambulation and states he is feeling well. Pt to be walked by nursing staff over weekend as to not regress in ambulation. Continue with current POC.  Follow Up Recommendations  No PT follow up;Supervision for mobility/OOB     Equipment Recommendations       Recommendations for Other Services       Precautions / Restrictions Precautions Precautions: Fall Restrictions Weight Bearing Restrictions: No    Mobility  Bed Mobility               General bed mobility comments: Pt found in chair and returned to chair after session.  Transfers Overall transfer level: Needs assistance Equipment used: Rolling walker (2 wheeled) Transfers: Sit to/from Stand Sit to Stand: Min guard         General transfer comment: VC for hand placement. Supervision for safety.  Ambulation/Gait Ambulation/Gait assistance: Min guard Ambulation Distance (Feet): 125 Feet Assistive device: Rolling walker (2 wheeled) Gait Pattern/deviations: Step-through pattern;Decreased stride length   Gait velocity interpretation: Below normal speed for age/gender General Gait Details: Slow and cautious during ambulation. Cues for upright posture and safety with RW.   Stairs            Wheelchair Mobility    Modified Rankin (Stroke Patients Only)       Balance                                    Cognition Arousal/Alertness: Awake/alert Behavior During Therapy: WFL for tasks assessed/performed Overall Cognitive Status: Within Functional Limits for tasks  assessed                      Exercises      General Comments        Pertinent Vitals/Pain Pain Assessment: Faces Faces Pain Scale: Hurts a little bit Pain Location: Abdominal area Pain Descriptors / Indicators: Discomfort;Sore Pain Intervention(s): Limited activity within patient's tolerance;Monitored during session    Home Living                      Prior Function            PT Goals (current goals can now be found in the care plan section) Progress towards PT goals: Progressing toward goals    Frequency  Min 3X/week    PT Plan Current plan remains appropriate    Co-evaluation             End of Session   Activity Tolerance: Patient tolerated treatment well Patient left: in chair;with call bell/phone within reach     Time: 1315-1330 PT Time Calculation (min) (ACUTE ONLY): 15 min  Charges:                       G CodesAllyn Kenner, SPTA 08/19/2014, 1:38 PM

## 2014-08-11 NOTE — Care Management Note (Signed)
Case Management Note  Patient Details  Name: OLSEN MCCUTCHAN MRN: [id number] Date of Birth: Jul 07, 1955  Subjective/Objective:  S/p exploratory laparotomy, sigmoid colectomy with end colostomy on 08/05/14. Plan is to  Advance diet and d/c home with [facility] services once stable. DME needs for Wound VAC.            Action/Plan: CM did make referral for Wound VAC with [facility]. Information to be faxed in via [facility]. CM did make referral with [facility] for RN services. SOC to begin within 24-48 hrs post d/c. No further needs from CM at this time.    Expected Discharge Date:                  Expected Discharge Plan:  [place]  In-House Referral:  Financial Counselor  Discharge planning Services  CM Consult  Post Acute Care Choice:  Home Health Choice offered to:  Patient  DME Arranged:  Vac DME Agency:  [facility]  HH Arranged:  RN [employer] Agency:  [place]  Status of Service:  Completed, signed off  Medicare Important Message Given:    Date Medicare IM Given:    Medicare IM give by:    Date Additional Medicare IM Given:    Additional Medicare Important Message give by:     If discussed at [place] of Stay Meetings, dates discussed:    Additional Comments:  Bethena Roys, RN 08/11/2014, 11:43 AM

## 2014-08-11 NOTE — Progress Notes (Signed)
PATIENT DETAILS Name: [name] Age: 59 y.o. Sex: male Date of Birth: 1955-03-16 Admit Date: 08/04/2014 Admitting Physician Collene Gobble, MD [id number], Luane School, NP  Subjective: Tolerating diet, stool in colostomy.  Assessment/Plan: Principal Problem: Perforated sigmoid diverticulitis with peritonitis: Required exploratory laparotomy with sigmoid colectomy and end colostomy on 7/23. Surgery following, diet being advanced. He remains on empiric Zosyn, suspect could be transitioned to Augmentin on discharge.  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 If okay with surgery, will restart aspirin on discharge. 2-D echocardiogram showed preserved ejection fraction of around 50-55%.  Ventilator dependent respiratory failure postoperatively: Remained intubated postoperatively on 7/23, extubated on 7/24. Respiratory failure has now resolved, currently on room air.  History of suspected COPD: Continue with as needed bronchodilators. PCCM follow up as outpatient for PFTs.  Recent history of cardiac arrest: Patient had a recent admission a few days prior to this admission for cardiac arrest suspected to be medication related (Cetuximab)  History of Squamous Cell Carcinoma of the Larynx: Oncology following-recommendations are for no further future systemic treatment-oncology will make arrangements for follow-up with radiation oncology.  History of EtOH abuse: No signs of withdrawal. Follow-up in monitor closely.  Hypokalemia/hypomagnesemia: Resolved.  Disposition: Remain inpatient  Antimicrobial agents  See below  Anti-infectives    Start     Dose/Rate Route Frequency Ordered Stop   08/05/14 1000  vancomycin (VANCOCIN) IVPB 1000 mg/200 mL premix  Status:   Discontinued     1,000 mg 200 mL/hr over 60 Minutes Intravenous Every 12 hours 08/05/14 0102 08/09/14 0828   08/05/14 0600  piperacillin-tazobactam (ZOSYN) IVPB 3.375 g     3.375 g 12.5 mL/hr over 240 Minutes Intravenous 3 times per day 08/05/14 0054     08/05/14 0045  vancomycin (VANCOCIN) IVPB 1000 mg/200 mL premix  Status:  Discontinued     1,000 mg 200 mL/hr over 60 Minutes Intravenous Every 12 hours 08/05/14 0038 08/05/14 0053   08/05/14 0045  piperacillin-tazobactam (ZOSYN) IVPB 3.375 g  Status:  Discontinued     3.375 g 100 mL/hr over 30 Minutes Intravenous 3 times per day 08/05/14 0038 08/05/14 0053   08/04/14 2145  piperacillin-tazobactam (ZOSYN) IVPB 3.375 g     3.375 g 12.5 mL/hr over 240 Minutes Intravenous  Once 08/04/14 2142 08/05/14 0207   08/04/14 2145  vancomycin (VANCOCIN) IVPB 1000 mg/200 mL premix     1,000 mg 200 mL/hr over 60 Minutes Intravenous  Once 08/04/14 2142 08/05/14 0011      DVT Prophylaxis: Prophylactic Heparin  Code Status: Full code   Family Communication Daughter at bedside  Procedures: Hartman's procedure on 7/23  CONSULTS:  cardiology, pulmonary/intensive care and general surgery  Time spent 30 minutes-Greater than 50% of this time was spent in counseling, explanation of diagnosis, planning of further management, and coordination of care.  MEDICATIONS: Scheduled Meds: . sodium chloride   Intravenous Once  . antiseptic oral rinse  7 mL Mouth Rinse q12n4p  . carvedilol  6.25 mg Oral BID WC  . chlorhexidine  15 mL Mouth Rinse BID  . fentaNYL  50 mcg Transdermal Q72H  . heparin subcutaneous  5,000 Units Subcutaneous 3 times per day  . piperacillin-tazobactam (ZOSYN)  IV  3.375 g  Intravenous 3 times per day   Continuous Infusions:  PRN Meds:.sodium chloride, ALPRAZolam, oxyCODONE, sodium chloride    PHYSICAL EXAM: Vital signs in last 24 hours: Filed Vitals:   08/11/14 0555 08/11/14 0957 08/11/14 1000 08/11/14 1450  BP: 105/62  116/72 116/72 116/62  Pulse: 83 88 98 86  Temp: 97.9 F (36.6 C)   98.2 F (36.8 C)  TempSrc: Oral   Oral  Resp: 16     Height:      Weight: 81.058 kg (178 lb 11.2 oz)     SpO2: 97%  93% 98%    Weight change: -0.635 kg (-1 lb 6.4 oz) Filed Weights   08/08/14 0527 08/10/14 0511 08/11/14 0555  Weight: 83.371 kg (183 lb 12.8 oz) 81.693 kg (180 lb 1.6 oz) 81.058 kg (178 lb 11.2 oz)   Body mass index is 27.98 kg/(m^2).   Gen Exam: Awake and alert with clear speech.   Neck: Supple, No JVD.   Chest: B/L Clear.   CVS: S1 S2 Regular, no murmurs.  Abdomen: soft, BS +, non tender, non distended.VAC and colostomy in place Extremities: no edema, lower extremities warm to touch. Neurologic: Non Focal.   Skin: No Rash.   Wounds: N/A.    Intake/Output from previous day:  Intake/Output Summary (Last 24 hours) at 08/11/14 1521 Last data filed at 08/11/14 0920  Gross per 24 hour  Intake   1130 ml  Output    970 ml  Net    160 ml     LAB RESULTS: CBC  Recent Labs Lab 08/04/14 1950 08/05/14 0115 08/05/14 1241 08/06/14 0418 08/07/14 0305 08/08/14 0434 08/09/14 0517  WBC 17.4* 10.1  --  13.5* 12.7* 9.6 8.9  HGB 10.1* 10.4* 8.2* 9.6* 9.5* 10.2* 10.9*  HCT 30.6* 31.6* 24.0* 29.4* 28.4* 30.6* 33.0*  PLT 312 283  --  197 184 239 254  MCV 93.3 93.5  --  90.7 91.3 91.6 91.9  MCH 30.8 30.8  --  29.6 30.5 30.5 30.4  MCHC 33.0 32.9  --  32.7 33.5 33.3 33.0  RDW 15.0 15.2  --  16.2* 15.9* 15.3 15.3  LYMPHSABS 1.2  --   --   --   --  1.8  --   MONOABS 1.2*  --   --   --   --  0.9  --   EOSABS 0.0  --   --   --   --  0.5  --   BASOSABS 0.0  --   --   --   --  0.0  --     Chemistries   Recent Labs Lab 08/05/14 0115  08/07/14 0305 08/08/14 0434 08/08/14 1148 08/09/14 0517 08/10/14 0350 08/11/14 0635  NA 136  < > 139 140  --  139 138 133*  K 4.1  < > 3.0* 3.0* 3.4* 3.1* 3.6 3.9  CL 103  < > 105 98*  --  99* 102 99*  CO2 26  < > 27 33*  --  31 28 28   GLUCOSE 98  < > 94 90   --  98 87 92  BUN 18  < > 12 12  --  12 11 9   CREATININE 1.07  < > 1.03 1.11  --  1.04 0.98 1.04  CALCIUM 8.2*  < > 7.8* 8.0*  --  8.0* 7.9* 8.0*  MG 1.9  --   --   --  2.0 2.0 2.0 1.9  < > = values in  this interval not displayed.  CBG:  Recent Labs Lab 08/10/14 2032 08/11/14 0037 08/11/14 0453 08/11/14 0737 08/11/14 1135  GLUCAP 96 91 86 85 101*    GFR Estimated Creatinine Clearance: 76.1 mL/min (by C-G formula based on Cr of 1.04).  Coagulation profile No results for input(s): INR, PROTIME in the last 168 hours.  Cardiac Enzymes  Recent Labs Lab 08/05/14 0115 08/05/14 0800 08/05/14 1330  TROPONINI 1.54* 0.94* 0.60*    Invalid input(s): POCBNP No results for input(s): DDIMER in the last 72 hours. No results for input(s): HGBA1C in the last 72 hours. No results for input(s): CHOL, HDL, LDLCALC, TRIG, CHOLHDL, LDLDIRECT in the last 72 hours. No results for input(s): TSH, T4TOTAL, T3FREE, THYROIDAB in the last 72 hours.  Invalid input(s): FREET3 No results for input(s): VITAMINB12, FOLATE, FERRITIN, TIBC, IRON, RETICCTPCT in the last 72 hours. No results for input(s): LIPASE, AMYLASE in the last 72 hours.  Urine Studies No results for input(s): UHGB, CRYS in the last 72 hours.  Invalid input(s): UACOL, UAPR, USPG, UPH, UTP, UGL, UKET, UBIL, UNIT, UROB, ULEU, UEPI, UWBC, URBC, UBAC, CAST, UCOM, BILUA  MICROBIOLOGY: Recent Results (from the past 240 hour(s))  MRSA PCR Screening     Status: None   Collection Time: 08/02/14 11:24 AM  Result Value Ref Range Status   MRSA by PCR NEGATIVE NEGATIVE Final    Comment:        The GeneXpert MRSA Assay (FDA approved for NASAL specimens only), is one component of a comprehensive MRSA colonization surveillance program. It is not intended to diagnose MRSA infection nor to guide or monitor treatment for MRSA infections.   Culture, blood (routine x 2)     Status: None   Collection Time: 08/05/14  1:15 AM  Result Value  Ref Range Status   Specimen Description BLOOD LEFT ARM  Final   Special Requests BOTTLES DRAWN AEROBIC ONLY 10CC  Final   Culture   Final    NO GROWTH 5 DAYS Performed at [facility]    Report Status 08/10/2014 FINAL  Final  Culture, blood (routine x 2)     Status: None   Collection Time: 08/05/14  1:20 AM  Result Value Ref Range Status   Specimen Description BLOOD RIGHT HAND  Final   Special Requests BOTTLES DRAWN AEROBIC AND ANAEROBIC 10CC  Final   Culture   Final    NO GROWTH 5 DAYS Performed at [facility]    Report Status 08/10/2014 FINAL  Final  Surgical pcr screen     Status: None   Collection Time: 08/05/14  4:21 AM  Result Value Ref Range Status   MRSA, PCR NEGATIVE NEGATIVE Final   Staphylococcus aureus NEGATIVE NEGATIVE Final    Comment:        The Xpert SA Assay (FDA approved for NASAL specimens in patients over 64 years of age), is one component of a comprehensive surveillance program.  Test performance has been validated by [facility] for patients greater than or equal to 17 year old. It is not intended to diagnose infection nor to guide or monitor treatment.     RADIOLOGY STUDIES/RESULTS: Nm Pet Image Initial (pi) Skull Base To Thigh  07/27/2014   CLINICAL DATA:  Initial treatment strategy for laryngeal carcinoma.  EXAM: NUCLEAR MEDICINE PET SKULL BASE TO THIGH  TECHNIQUE: 8.6 mCi F-18 FDG was injected intravenously. Full-ring PET imaging was performed from the skull base to thigh after the radiotracer. CT data was  obtained and used for attenuation correction and anatomic localization.  FASTING BLOOD GLUCOSE:  Value: 111 mg/dl  COMPARISON:  Neck CT 06/22/2014  FINDINGS: NECK  There is a long segment of hypermetabolic activity centered in the left larynx with intense metabolic activity (SUV max equal 15.1. Activity just crosses midline anteriorly.  There is a single mildly hypermetabolic left level 2 lymph node which is small measuring 7 mm short  axis on image 30, series 4 with SUV max of 2.0. No clear additional hypermetabolic cervical lymph nodes are present.  CHEST  No hypermetabolic supraclavicular nodes. No hypermetabolic mediastinal nodes. Single very small nodule in the right upper lobe measures 4 mm on image 77, series 4.  ABDOMEN/PELVIS  No abnormal hypermetabolic activity within the liver, pancreas, adrenal glands, or spleen. No hypermetabolic lymph nodes in the abdomen or pelvis.  Atherosclerotic calcification aorta noted.  SKELETON  No focal hypermetabolic activity to suggest skeletal metastasis.  IMPRESSION: 1. Intense metabolic activity associated with the left laryngeal mass. 2. A single mildly hypermetabolic small left level 2 lymph node. No additional evidence of metastatic adenopathy in neck. 3. No evidence of or distant metastatic adenopathy in the thorax. 4. Single small right upper lobe pulmonary nodule is likely benign. Recommend attention on follow-up   Electronically Signed   By: Suzy Bouchard M.D.   On: 07/27/2014 16:53   Ir Fluoro Guide Cv Line Right  07/28/2014   CLINICAL DATA:  Squamous cell carcinoma of the larynx  EXAM: RIGHT INTERNAL JUGULAR SINGLE LUMEN POWER PORT CATHETER INSERTION  Date:  7/15/20167/15/2016 3:42 pm  Radiologist:  M. Daryll Brod, MD  Guidance:  Ultrasound fluoroscopic  FLUOROSCOPY TIME:  30 seconds, 18 mGy  MEDICATIONS AND MEDICAL HISTORY: 2 g Ancef,administered within 1 hour of the procedure.6 mg Versed, 100 mcg fentanyl  ANESTHESIA/SEDATION: 27 minutes  CONTRAST:  None  COMPLICATIONS: None  PROCEDURE: Informed consent was obtained from the patient following explanation of the procedure, risks, benefits and alternatives. The patient understands, agrees and consents for the procedure. All questions were addressed. A time out was performed.  Maximal barrier sterile technique utilized including caps, mask, sterile gowns, sterile gloves, large sterile drape, hand hygiene, and 2% chlorhexidine scrub.   Under sterile conditions and local anesthesia, right internal jugular micropuncture venous access was performed. Access was performed with ultrasound. Images were obtained for documentation. A guide wire was inserted followed by a transitional dilator. This allowed insertion of a guide wire and catheter into the IVC. Measurements were obtained from the SVC / RA junction back to the right IJ venotomy site. In the right infraclavicular chest, a subcutaneous pocket was created over the second anterior rib. This was done under sterile conditions and local anesthesia. 1% lidocaine with epinephrine was utilized for this. A 2.5 cm incision was made in the skin. Blunt dissection was performed to create a subcutaneous pocket over the right pectoralis major muscle. The pocket was flushed with saline vigorously. There was adequate hemostasis. The port catheter was assembled and checked for leakage. The port catheter was secured in the pocket with two retention sutures. The tubing was tunneled subcutaneously to the right venotomy site and inserted into the SVC/RA junction through a valved peel-away sheath. Position was confirmed with fluoroscopy. Images were obtained for documentation. The patient tolerated the procedure well. No immediate complications. Incisions were closed in a two layer fashion with 4 - 0 Vicryl suture. Dermabond was applied to the skin. The port catheter was accessed, blood was aspirated  followed by saline and heparin flushes. Needle was removed. A dry sterile dressing was applied.  IMPRESSION: Ultrasound and fluoroscopically guided right internal jugular single lumen power port catheter insertion. Tip in the SVC/RA junction. Catheter ready for use.   Electronically Signed   By: Jerilynn Mages.  Shick M.D.   On: 07/28/2014 15:46   Ir US Guide Vasc Access Right  07/28/2014   CLINICAL DATA:  Squamous cell carcinoma of the larynx  EXAM: RIGHT INTERNAL JUGULAR SINGLE LUMEN POWER PORT CATHETER INSERTION  Date:   7/15/20167/15/2016 3:42 pm  Radiologist:  M. Daryll Brod, MD  Guidance:  Ultrasound fluoroscopic  FLUOROSCOPY TIME:  30 seconds, 18 mGy  MEDICATIONS AND MEDICAL HISTORY: 2 g Ancef,administered within 1 hour of the procedure.6 mg Versed, 100 mcg fentanyl  ANESTHESIA/SEDATION: 27 minutes  CONTRAST:  None  COMPLICATIONS: None  PROCEDURE: Informed consent was obtained from the patient following explanation of the procedure, risks, benefits and alternatives. The patient understands, agrees and consents for the procedure. All questions were addressed. A time out was performed.  Maximal barrier sterile technique utilized including caps, mask, sterile gowns, sterile gloves, large sterile drape, hand hygiene, and 2% chlorhexidine scrub.  Under sterile conditions and local anesthesia, right internal jugular micropuncture venous access was performed. Access was performed with ultrasound. Images were obtained for documentation. A guide wire was inserted followed by a transitional dilator. This allowed insertion of a guide wire and catheter into the IVC. Measurements were obtained from the SVC / RA junction back to the right IJ venotomy site. In the right infraclavicular chest, a subcutaneous pocket was created over the second anterior rib. This was done under sterile conditions and local anesthesia. 1% lidocaine with epinephrine was utilized for this. A 2.5 cm incision was made in the skin. Blunt dissection was performed to create a subcutaneous pocket over the right pectoralis major muscle. The pocket was flushed with saline vigorously. There was adequate hemostasis. The port catheter was assembled and checked for leakage. The port catheter was secured in the pocket with two retention sutures. The tubing was tunneled subcutaneously to the right venotomy site and inserted into the SVC/RA junction through a valved peel-away sheath. Position was confirmed with fluoroscopy. Images were obtained for documentation. The patient  tolerated the procedure well. No immediate complications. Incisions were closed in a two layer fashion with 4 - 0 Vicryl suture. Dermabond was applied to the skin. The port catheter was accessed, blood was aspirated followed by saline and heparin flushes. Needle was removed. A dry sterile dressing was applied.  IMPRESSION: Ultrasound and fluoroscopically guided right internal jugular single lumen power port catheter insertion. Tip in the SVC/RA junction. Catheter ready for use.   Electronically Signed   By: Jerilynn Mages.  Shick M.D.   On: 07/28/2014 15:46   Dg Chest Port 1 View  08/06/2014   CLINICAL DATA:  Endotracheal tube placement.  Initial encounter.  EXAM: PORTABLE CHEST - 1 VIEW  COMPARISON:  Chest radiograph performed 08/05/2014  FINDINGS: The patient's endotracheal tube is seen ending 4-5 cm above the carina. An enteric tube is noted extending below the diaphragm. A right-sided chest port is seen ending about the distal SVC.  Vascular congestion is noted. Right basilar airspace opacity may reflect atelectasis or possibly mild pneumonia. No definite pleural effusion or pneumothorax is seen.  The cardiomediastinal silhouette is borderline normal in size. The patient is status post median sternotomy. No acute osseous abnormalities are seen.  IMPRESSION: 1. Endotracheal tube seen ending 4-5 cm  above the carina. 2. Vascular congestion noted. Right basilar airspace opacity may reflect atelectasis or possibly mild pneumonia.   Electronically Signed   By: Garald Balding M.D.   On: 08/06/2014 06:24   Dg Chest Port 1 View  08/05/2014   CLINICAL DATA:  Status post exploratory laparotomy for perforated viscus. Intubated patient.  EXAM: PORTABLE CHEST - 1 VIEW  COMPARISON:  CT chest and stress a CT chest 08/04/2014. Single view of the chest 08/03/2014.  FINDINGS: NG tube is in place with the tip in good position in the stomach. Endotracheal tube is also seen with the tip just below the clavicular heads, well above the  carina. Right IJ approach Port-A-Cath is noted. Lung volumes are low with basilar atelectasis. There small bilateral pleural effusions. No pneumothorax.  IMPRESSION: Support apparatus projects in good position.  Small bilateral pleural effusions and basilar atelectasis.   Electronically Signed   By: Inge Rise M.D.   On: 08/05/2014 14:36   Dg Chest Port 1 View  08/03/2014   CLINICAL DATA:  Respiratory failure.  Endotracheal tube position.  EXAM: PORTABLE CHEST - 1 VIEW  COMPARISON:  08/02/2014.  FINDINGS: 0441 hr. Endotracheal is approximately 3 cm above the carina. Nasogastric tube projects below the diaphragm, tip not visualized. There is a right IJ central venous catheter with its tip at the lower SVC level. The heart size and mediastinal contours are stable status post CABG. There are lower lung volumes with mildly increased left lower lobe atelectasis. No pneumothorax or significant pleural effusion identified. The bones appear unchanged.  IMPRESSION: Little change in position of the support system. Mildly increased left basilar atelectasis.   Electronically Signed   By: Richardean Sale M.D.   On: 08/03/2014 07:11   Dg Chest Portable 1 View  08/02/2014   CLINICAL DATA:  Status post CPR, intubation  EXAM: PORTABLE CHEST - 1 VIEW  COMPARISON:  None.  FINDINGS: Endotracheal tube with the tip 4 cm above the carina. Right-sided Port-A-Cath in satisfactory position.  Mild bilateral interstitial prominence. There is no focal parenchymal opacity. There is no pleural effusion or pneumothorax. The heart and mediastinal contours are unremarkable. Prior CABG.  The osseous structures are unremarkable.  IMPRESSION: 1. Endotracheal tube with the tip 4 cm above the carina.   Electronically Signed   By: Kathreen Devoid   On: 08/02/2014 10:26   Ct Angio Chest Aorta W/cm &/or Wo/cm  08/04/2014   CLINICAL DATA:  Current history of laryngeal carcinoma. Patient discharged from the hospital earlier today after a brief  cardiac arrest felt to be related to use of Cetuximab, also with no urine output since 0830 hr earlier today and no bowel movement in 2 days. She presents with abdominal pain and distention.  EXAM: CT ANGIOGRAPHY CHEST, ABDOMEN AND PELVIS  TECHNIQUE: Multidetector CT imaging through the chest, abdomen and pelvis was performed using the standard protocol before and during bolus administration of intravenous contrast. Multiplanar reconstructed images and MIPs were obtained and reviewed to evaluate the vascular anatomy.  CONTRAST:  168mL OMNIPAQUE IOHEXOL 350 MG/ML IV. Oral contrast was also administered.  COMPARISON:  PET-CT 07/27/2014.  FINDINGS: CTA CHEST FINDINGS  Unenhanced images demonstrate no evidence of mural hematoma. Stent at the origin of the left subclavian artery is again noted.  Contrast enhancement of the thoracic aorta is excellent. No evidence of thoracic aortic dissection or aneurysm. Moderate atherosclerosis. Prior CABG, and the coronary graft to a diagonal branch of the LAD does opacify. A  stent is present within the coronary graft. Central pulmonary arteries are patent. 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 The left subclavian artery stent is patent. Calcified and noncalcified plaque are present in the proximal innominate artery with mild stenosis. Calcified and noncalcified plaque are present at the origin of the right vertebral artery with moderate stenosis.  Heart mildly enlarged.  No pericardial effusion.  Emphysematous changes throughout both lungs. Dependent atelectasis posteriorly in the lower lobes. Small bilateral pleural effusions. Lungs otherwise clear without confluent airspace consolidation, interstitial disease, or parenchymal nodules. Central airways patent with moderate central peribronchial thickening.  No significant mediastinal, hilar or axillary lymphadenopathy. Thyroid gland normal in appearance. Right jugular Port-A-Cath tip at the cavoatrial  junction. Bone window images demonstrate osseous demineralization, mild diffuse thoracic spondylosis, multiple Schmorl's nodes, but no evidence of osseous metastatic disease.  Review of the MIP images confirms the above findings.  CTA ABDOMEN AND PELVIS FINDINGS  No evidence of abdominal aortic aneurysm or dissection. Severe atherosclerosis involving the abdominal aorta with calcified and noncalcified plaque. High-grade stenosis at the origin of the celiac artery. Mild stenosis at the origin of the SMA. High-grade stenosis at the origin of the IMA. High-grade stenoses at the origins of the renal arteries bilaterally. Severe atherosclerosis involving the iliofemoral arteries, with high-grade stenosis suspected in the left common iliac artery and moderate stenosis in the right common iliac artery.  Pneumoperitoneum, with free air scattered throughout the abdomen and pelvis. A midline supraumbilical abdominal wall hernia is present and there are gas bubbles in the subcutaneous fat of the abdominal wall at the level of the hernia. There are acute inflammatory changes in the right upper pelvis, where there are distal ileal loops and the mid sigmoid colon. I believe the sigmoid colon is the site of the perforation, as there is asymmetric wall thickening along its right lateral wall, and there is gas within the bowel wall. There are scattered diverticula involving the mid sigmoid colon in this region. There is no evidence of bowel obstruction. A small amount of ascites is present in the pelvis. A normal appendix is identified in the right mid abdomen.  Diffuse hepatic steatosis without focal hepatic parenchymal abnormality. Normal appearing spleen, pancreas, right adrenal gland, and kidneys. Approximate 2.3 cm nodule arising from the left adrenal gland which was not hypermetabolic on the prior PET-CT. No significant lymphadenopathy involving the abdomen or pelvis. Small hiatal hernia; stomach otherwise normal in  appearance. Normal appearing urinary bladder. Prostate gland and seminal vesicles normal for age. Small left inguinal hernia containing fat.  Bone window images demonstrate degenerative changes involving the facet joints of the lower lumbar spine, degenerative changes involving the right sacroiliac joint, but no evidence of osseous metastatic disease.  Review of the MIP images confirms the above findings.  IMPRESSION: 1. Pneumoperitoneum. The origin of the free intraperitoneal air is felt to be perforated diverticulitis involving the mid sigmoid colon. 2. No evidence of thoracic or abdominal aortic dissection. 3. Generalized atherosclerosis as detailed above. 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 4. COPD/emphysema. Small bilateral pleural effusions and mild bilateral lower lobe atelectasis. No acute cardiopulmonary disease otherwise. 5. Diffuse hepatic steatosis. 6. Left adrenal adenoma. 7. Small hiatal hernia. 8. Small left inguinal hernia containing fat. 9. Midline supraumbilical abdominal wall hernia. Critical Value/emergent results were called by telephone at the time of interpretation on 08/04/2014 at 9:29 pm to Dr. Pattricia Boss, who verbally acknowledged these  results.   Electronically Signed   By: Evangeline Dakin M.D.   On: 08/04/2014 21:34   Ct Angio Abd/pel W/ And/or W/o  08/04/2014   CLINICAL DATA:  Current history of laryngeal carcinoma. Patient discharged from the hospital earlier today after a brief cardiac arrest felt to be related to use of Cetuximab, also with no urine output since 0830 hr earlier today and no bowel movement in 2 days. She presents with abdominal pain and distention.  EXAM: CT ANGIOGRAPHY CHEST, ABDOMEN AND PELVIS  TECHNIQUE: Multidetector CT imaging through the chest, abdomen and pelvis was performed using the standard protocol before and during bolus administration of intravenous contrast. Multiplanar reconstructed images and MIPs were obtained and  reviewed to evaluate the vascular anatomy.  CONTRAST:  154mL OMNIPAQUE IOHEXOL 350 MG/ML IV. Oral contrast was also administered.  COMPARISON:  PET-CT 07/27/2014.  FINDINGS: CTA CHEST FINDINGS  Unenhanced images demonstrate no evidence of mural hematoma. Stent at the origin of the left subclavian artery is again noted.  Contrast enhancement of the thoracic aorta is excellent. No evidence of thoracic aortic dissection or aneurysm. Moderate atherosclerosis. Prior CABG, and the coronary graft to a diagonal branch of the LAD does opacify. A stent is present within the coronary graft. Central pulmonary arteries are patent. 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 The left subclavian artery stent is patent. Calcified and noncalcified plaque are present in the proximal innominate artery with mild stenosis. Calcified and noncalcified plaque are present at the origin of the right vertebral artery with moderate stenosis.  Heart mildly enlarged.  No pericardial effusion.  Emphysematous changes throughout both lungs. Dependent atelectasis posteriorly in the lower lobes. Small bilateral pleural effusions. Lungs otherwise clear without confluent airspace consolidation, interstitial disease, or parenchymal nodules. Central airways patent with moderate central peribronchial thickening.  No significant mediastinal, hilar or axillary lymphadenopathy. Thyroid gland normal in appearance. Right jugular Port-A-Cath tip at the cavoatrial junction. Bone window images demonstrate osseous demineralization, mild diffuse thoracic spondylosis, multiple Schmorl's nodes, but no evidence of osseous metastatic disease.  Review of the MIP images confirms the above findings.  CTA ABDOMEN AND PELVIS FINDINGS  No evidence of abdominal aortic aneurysm or dissection. Severe atherosclerosis involving the abdominal aorta with calcified and noncalcified plaque. High-grade stenosis at the origin of the celiac artery. Mild stenosis  at the origin of the SMA. High-grade stenosis at the origin of the IMA. High-grade stenoses at the origins of the renal arteries bilaterally. Severe atherosclerosis involving the iliofemoral arteries, with high-grade stenosis suspected in the left common iliac artery and moderate stenosis in the right common iliac artery.  Pneumoperitoneum, with free air scattered throughout the abdomen and pelvis. A midline supraumbilical abdominal wall hernia is present and there are gas bubbles in the subcutaneous fat of the abdominal wall at the level of the hernia. There are acute inflammatory changes in the right upper pelvis, where there are distal ileal loops and the mid sigmoid colon. I believe the sigmoid colon is the site of the perforation, as there is asymmetric wall thickening along its right lateral wall, and there is gas within the bowel wall. There are scattered diverticula involving the mid sigmoid colon in this region. There is no evidence of bowel obstruction. A small amount of ascites is present in the pelvis. A normal appendix is identified in the right mid abdomen.  Diffuse hepatic steatosis without focal hepatic parenchymal abnormality. Normal appearing spleen, pancreas, right adrenal gland, and kidneys.  Approximate 2.3 cm nodule arising from the left adrenal gland which was not hypermetabolic on the prior PET-CT. No significant lymphadenopathy involving the abdomen or pelvis. Small hiatal hernia; stomach otherwise normal in appearance. Normal appearing urinary bladder. Prostate gland and seminal vesicles normal for age. Small left inguinal hernia containing fat.  Bone window images demonstrate degenerative changes involving the facet joints of the lower lumbar spine, degenerative changes involving the right sacroiliac joint, but no evidence of osseous metastatic disease.  Review of the MIP images confirms the above findings.  IMPRESSION: 1. Pneumoperitoneum. The origin of the free intraperitoneal air is felt  to be perforated diverticulitis involving the mid sigmoid colon. 2. No evidence of thoracic or abdominal aortic dissection. 3. Generalized atherosclerosis as detailed above. 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 4. COPD/emphysema. Small bilateral pleural effusions and mild bilateral lower lobe atelectasis. No acute cardiopulmonary disease otherwise. 5. Diffuse hepatic steatosis. 6. Left adrenal adenoma. 7. Small hiatal hernia. 8. Small left inguinal hernia containing fat. 9. Midline supraumbilical abdominal wall hernia. Critical Value/emergent results were called by telephone at the time of interpretation on 08/04/2014 at 9:29 pm to Dr. Pattricia Boss, who verbally acknowledged these results.   Electronically Signed   By: Evangeline Dakin M.D.   On: 08/04/2014 21:34    Oren Binet, MD  [facility] Pager:[phone]  If 7PM-7AM, please contact night-coverage [email or web address] Password [email or web address] 08/11/2014, 3:21 PM   LOS: 6 days

## 2014-08-11 NOTE — Progress Notes (Signed)
[facility] Progress Note 6 Days Post-Op  Subjective: Patient looks great.  Will advance to heart healthy diet.  Have Negative pressure wound dressing in place  Objective: Vital signs in last 24 hours: Temp:  [97.9 F (36.6 C)-98.7 F (37.1 C)] 97.9 F (36.6 C) (07/29 0555) Pulse Rate:  [68-83] 83 (07/29 0555) Resp:  [16] 16 (07/29 0555) BP: (98-112)/(53-70) 105/62 mmHg (07/29 0555) SpO2:  [94 %-97 %] 97 % (07/29 0555) Weight:  [81.058 kg (178 lb 11.2 oz)] 81.058 kg (178 lb 11.2 oz) (07/29 0555) Last BM Date: 08/10/14  Intake/Output from previous day: 07/28 0701 - 07/29 0700 In: 1082.5 [P.O.:720; IV Piggyback:362.5] Out: 1240 [Urine:1080; Drains:110; Stool:50] Intake/Output this shift:    General: No distress.  Probably needs to get out of bed a bit more.  Lungs: Clear  Abd: Good bowel sounds.  Ostomy is pink and viable.    Extremities: No changes.  Neuro: Intact  Lab Results:  @LABLAST2 (wbc:2,hgb:2,hct:2,plt:2) BMET  Recent Labs  08/10/14 0350 08/11/14 0635  NA 138 133*  K 3.6 3.9  CL 102 99*  CO2 28 28  GLUCOSE 87 92  BUN 11 9  CREATININE 0.98 1.04  CALCIUM 7.9* 8.0*   PT/INR No results for input(s): LABPROT, INR in the last 72 hours. ABG No results for input(s): PHART, HCO3 in the last 72 hours.  Invalid input(s): PCO2, PO2  Studies/Results: No results found.  Anti-infectives: Anti-infectives    Start     Dose/Rate Route Frequency Ordered Stop   08/05/14 1000  vancomycin (VANCOCIN) IVPB 1000 mg/200 mL premix  Status:  Discontinued     1,000 mg 200 mL/hr over 60 Minutes Intravenous Every 12 hours 08/05/14 0102 08/09/14 0828   08/05/14 0600  piperacillin-tazobactam (ZOSYN) IVPB 3.375 g     3.375 g 12.5 mL/hr over 240 Minutes Intravenous 3 times per day 08/05/14 0054     08/05/14 0045  vancomycin (VANCOCIN) IVPB 1000 mg/200 mL premix  Status:  Discontinued     1,000 mg 200 mL/hr over 60 Minutes Intravenous Every 12 hours 08/05/14 0038 08/05/14  0053   08/05/14 0045  piperacillin-tazobactam (ZOSYN) IVPB 3.375 g  Status:  Discontinued     3.375 g 100 mL/hr over 30 Minutes Intravenous 3 times per day 08/05/14 0038 08/05/14 0053   08/04/14 2145  piperacillin-tazobactam (ZOSYN) IVPB 3.375 g     3.375 g 12.5 mL/hr over 240 Minutes Intravenous  Once 08/04/14 2142 08/05/14 0207   08/04/14 2145  vancomycin (VANCOCIN) IVPB 1000 mg/200 mL premix     1,000 mg 200 mL/hr over 60 Minutes Intravenous  Once 08/04/14 2142 08/05/14 0011      Assessment/Plan: s/p Procedure(s): EXPLORATORY LAPAROTOMY WITH SIGMOIDOTOMY WITH COLECTOMY & COLOSTOMY Advance diet  We have to decide when we would like to discontinue the antibiotics, but patient can go home soon from our standpoint.  LOS: 6 days   Kathryne Eriksson. Dahlia Bailiff, MD, FACS [phone] [facility] Surgery 08/11/2014

## 2014-08-12 LAB — BASIC METABOLIC PANEL
Anion gap: 8 (ref 5–15)
BUN: 9 mg/dL (ref 6–20)
CALCIUM: 8 mg/dL — AB (ref 8.9–10.3)
CHLORIDE: 100 mmol/L — AB (ref 101–111)
CO2: 25 mmol/L (ref 22–32)
CREATININE: 1.03 mg/dL (ref 0.61–1.24)
GFR calc Af Amer: >60 mL/min (ref >60)
GFR calc non Af Amer: >60 mL/min (ref >60)
GLUCOSE: 100 mg/dL — AB (ref 65–99)
Potassium: 4 mmol/L (ref 3.5–5.1)
Sodium: 133 mmol/L — ABNORMAL LOW (ref 135–145)

## 2014-08-12 LAB — GLUCOSE, CAPILLARY
GLUCOSE-CAPILLARY: 95 mg/dL (ref 65–99)
Glucose-Capillary: 96 mg/dL (ref 65–99)
Glucose-Capillary: 98 mg/dL (ref 65–99)

## 2014-08-12 LAB — MAGNESIUM: MAGNESIUM: 2 mg/dL (ref 1.7–2.4)

## 2014-08-12 MED ORDER — HEPARIN SOD (PORK) LOCK FLUSH 100 UNIT/ML IV SOLN
500.0000 [IU] | INTRAVENOUS | Status: AC | PRN
  Administered 2014-08-12: 500 [IU]

## 2014-08-12 MED ORDER — ALBUTEROL SULFATE HFA 108 (90 BASE) MCG/ACT IN AERS: 2.0000 | INHALATION_SPRAY | Freq: Four times a day (QID) | RESPIRATORY_TRACT | Status: DC | PRN

## 2014-08-12 MED ORDER — OXYCODONE HCL 5 MG PO TABS: 5.0000 mg | ORAL_TABLET | ORAL | Status: DC | PRN

## 2014-08-12 MED ORDER — CARVEDILOL 12.5 MG PO TABS: 6.2500 mg | ORAL_TABLET | Freq: Two times a day (BID) | ORAL | Status: DC

## 2014-08-12 NOTE — Progress Notes (Signed)
Patient ID: [name], male   DOB: 11/15/1955, 58 y.o.   MRN: [id number]     [place]      [address] [place], [place] [address]    Phone: [phone] FAX: [phone]     Subjective: Tolerating POs, pain controlled, ambulating.  Objective:  Vital signs:  Filed Vitals:   08/11/14 1817 08/11/14 2048 08/12/14 0614 08/12/14 0800  BP: 107/62 105/58 114/70 113/54  Pulse: 98 89 78 88  Temp:  98.3 F (36.8 C) 98.2 F (36.8 C)   TempSrc:  Oral Oral   Resp:  18 18   Height:      Weight:   88.225 kg (194 lb 8 oz)   SpO2:  96% 98%     Last BM Date:  (colostomy )  Intake/Output   Yesterday:  07/29 0701 - 07/30 0700 In: 750 [P.O.:600; IV Piggyback:150] Out: 6387 [Urine:1495; Drains:135; Stool:45] This shift:    I/O last 3 completed shifts: In: 5643 [P.O.:1320; IV Piggyback:200] Out: 2535 [Urine:2295; Drains:195; Stool:45]    Physical Exam: General: Pt awake/alert/oriented x4 in no acute distress Abdomen: Soft. Nondistended. +bs. Wound is clean, fascia is intact, vac replaced. RLQ drain with serous output. LLQ stoma is pink and viable, stool noted. No evidence of peritonitis. No incarcerated hernias.     Problem List:   Principal Problem:   NSTEMI (non-ST elevated myocardial infarction) Active Problems:   CAD-hx of CABG '06, DES LCX 01/2013, DES SVG[facility]LAD 5/21/5 [facility]   PAD (peripheral artery disease)   Bowel perforation   Troponin level elevated   Other emphysema   Cardiac ischemia   Perforated bowel   Alcohol abuse   Peritonitis   Hypokalemia   Hypomagnesemia    Results:   Labs: Results for orders placed or performed during the hospital encounter of 08/04/14 (from the past 48 hour(s))  Glucose, capillary     Status: None   Collection Time: 08/10/14 12:20 PM  Result Value Ref Range   Glucose-Capillary 88 65 - 99 mg/dL  Glucose, capillary     Status: Abnormal   Collection Time: 08/10/14   4:32 PM  Result Value Ref Range   Glucose-Capillary 102 (H) 65 - 99 mg/dL  Glucose, capillary     Status: None   Collection Time: 08/10/14  8:32 PM  Result Value Ref Range   Glucose-Capillary 96 65 - 99 mg/dL  Glucose, capillary     Status: None   Collection Time: 08/11/14 12:37 AM  Result Value Ref Range   Glucose-Capillary 91 65 - 99 mg/dL  Glucose, capillary     Status: None   Collection Time: 08/11/14  4:53 AM  Result Value Ref Range   Glucose-Capillary 86 65 - 99 mg/dL  Magnesium     Status: None   Collection Time: 08/11/14  6:35 AM  Result Value Ref Range   Magnesium 1.9 1.7 - 2.4 mg/dL  Basic metabolic panel     Status: Abnormal   Collection Time: 08/11/14  6:35 AM  Result Value Ref Range   Sodium 133 (L) 135 - 145 mmol/L   Potassium 3.9 3.5 - 5.1 mmol/L   Chloride 99 (L) 101 - 111 mmol/L   CO2 28 22 - 32 mmol/L   Glucose, Bld 92 65 - 99 mg/dL   BUN 9 6 - 20 mg/dL   Creatinine, Ser 1.04 0.61 - 1.24 mg/dL   Calcium 8.0 (L) 8.9 - 10.3 mg/dL   GFR  calc non Af Amer >60 >60 mL/min   GFR calc Af Amer >60 >60 mL/min    Comment: (NOTE) The eGFR has been calculated using the CKD EPI equation. This calculation has not been validated in all clinical situations. eGFR's persistently <60 mL/min signify possible Chronic Kidney Disease.    Anion gap 6 5 - 15  Glucose, capillary     Status: None   Collection Time: 08/11/14  7:37 AM  Result Value Ref Range   Glucose-Capillary 85 65 - 99 mg/dL  Glucose, capillary     Status: Abnormal   Collection Time: 08/11/14 11:35 AM  Result Value Ref Range   Glucose-Capillary 101 (H) 65 - 99 mg/dL  Glucose, capillary     Status: None   Collection Time: 08/11/14  4:32 PM  Result Value Ref Range   Glucose-Capillary 92 65 - 99 mg/dL  Glucose, capillary     Status: None   Collection Time: 08/11/14  9:17 PM  Result Value Ref Range   Glucose-Capillary 92 65 - 99 mg/dL  Glucose, capillary     Status: Abnormal   Collection Time: 08/11/14 11:29  PM  Result Value Ref Range   Glucose-Capillary 103 (H) 65 - 99 mg/dL  Magnesium     Status: None   Collection Time: 08/12/14  2:57 AM  Result Value Ref Range   Magnesium 2.0 1.7 - 2.4 mg/dL  Basic metabolic panel     Status: Abnormal   Collection Time: 08/12/14  2:57 AM  Result Value Ref Range   Sodium 133 (L) 135 - 145 mmol/L   Potassium 4.0 3.5 - 5.1 mmol/L   Chloride 100 (L) 101 - 111 mmol/L   CO2 25 22 - 32 mmol/L   Glucose, Bld 100 (H) 65 - 99 mg/dL   BUN 9 6 - 20 mg/dL   Creatinine, Ser 1.03 0.61 - 1.24 mg/dL   Calcium 8.0 (L) 8.9 - 10.3 mg/dL   GFR calc non Af Amer >60 >60 mL/min   GFR calc Af Amer >60 >60 mL/min    Comment: (NOTE) The eGFR has been calculated using the CKD EPI equation. This calculation has not been validated in all clinical situations. eGFR's persistently <60 mL/min signify possible Chronic Kidney Disease.    Anion gap 8 5 - 15  Glucose, capillary     Status: None   Collection Time: 08/12/14  4:43 AM  Result Value Ref Range   Glucose-Capillary 95 65 - 99 mg/dL  Glucose, capillary     Status: None   Collection Time: 08/12/14  7:50 AM  Result Value Ref Range   Glucose-Capillary 96 65 - 99 mg/dL    Imaging / Studies: No results found.  Medications / Allergies:  Scheduled Meds: . sodium chloride   Intravenous Once  . antiseptic oral rinse  7 mL Mouth Rinse q12n4p  . carvedilol  6.25 mg Oral BID WC  . chlorhexidine  15 mL Mouth Rinse BID  . famotidine  20 mg Oral BID  . fentaNYL  50 mcg Transdermal Q72H  . heparin subcutaneous  5,000 Units Subcutaneous 3 times per day  . piperacillin-tazobactam (ZOSYN)  IV  3.375 g Intravenous 3 times per day   Continuous Infusions:  PRN Meds:.sodium chloride, ALPRAZolam, oxyCODONE, sodium chloride  Antibiotics: Anti-infectives    Start     Dose/Rate Route Frequency Ordered Stop   08/05/14 1000  vancomycin (VANCOCIN) IVPB 1000 mg/200 mL premix  Status:  Discontinued     1,000 mg 200 mL/hr  over 60 Minutes  Intravenous Every 12 hours 08/05/14 0102 08/09/14 0828   08/05/14 0600  piperacillin-tazobactam (ZOSYN) IVPB 3.375 g     3.375 g 12.5 mL/hr over 240 Minutes Intravenous 3 times per day 08/05/14 0054     08/05/14 0045  vancomycin (VANCOCIN) IVPB 1000 mg/200 mL premix  Status:  Discontinued     1,000 mg 200 mL/hr over 60 Minutes Intravenous Every 12 hours 08/05/14 0038 08/05/14 0053   08/05/14 0045  piperacillin-tazobactam (ZOSYN) IVPB 3.375 g  Status:  Discontinued     3.375 g 100 mL/hr over 30 Minutes Intravenous 3 times per day 08/05/14 0038 08/05/14 0053   08/04/14 2145  piperacillin-tazobactam (ZOSYN) IVPB 3.375 g     3.375 g 12.5 mL/hr over 240 Minutes Intravenous  Once 08/04/14 2142 08/05/14 0207   08/04/14 2145  vancomycin (VANCOCIN) IVPB 1000 mg/200 mL premix     1,000 mg 200 mL/hr over 60 Minutes Intravenous  Once 08/04/14 2142 08/05/14 0011       Assessment/Plan Perforated sigmoid diverticulitis  POD#7 exploratory laparotomy, sigmoid colectomy with end colostomy---Dr. [name]  -surgically stable  -DC drain -VAC changes T-TH-Sat(changedt his AM, wound is clean, fascia is intact) ID-zosyn D#7/7--stop FEN-no issues VTE prophylaxis-SCD, heparin Dispo-stable for DC  USAA, ANP-BC [facility]   08/12/2014 8:40 AM

## 2014-08-12 NOTE — Discharge Instructions (Signed)
[facility]      [facility] °[phone] ° °OPEN ABDOMINAL SURGERY: POST OP INSTRUCTIONS ° °Always review your discharge instruction sheet given to you by the facility where your surgery was performed. ° °IF YOU HAVE DISABILITY OR FAMILY LEAVE FORMS, YOU MUST BRING THEM TO THE OFFICE FOR PROCESSING.  PLEASE DO NOT GIVE THEM TO YOUR DOCTOR. ° °1. A prescription for pain medication may be given to you upon discharge.  Take your pain medication as prescribed, if needed.  If narcotic pain medicine is not needed, then you may take acetaminophen (Tylenol) or ibuprofen (Advil) as needed. °2. Take your usually prescribed medications unless otherwise directed. °3. If you need a refill on your pain medication, please contact your pharmacy. They will contact our office to request authorization.  Prescriptions will not be filled after 5pm or on week-ends. °4. You should follow a light diet the first few days after arrival home, such as soup and crackers, pudding, etc.unless your doctor has advised otherwise. A high-fiber, low fat diet can be resumed as tolerated.   Be sure to include lots of fluids daily. Most patients will experience some swelling and bruising on the chest and neck area.  Ice packs will help.  Swelling and bruising can take several days to resolve °5. Most patients will experience some swelling and bruising in the area of the incision. Ice pack will help. Swelling and bruising can take several days to resolve..  °6. It is common to experience some constipation if taking pain medication after surgery.  Increasing fluid intake and taking a stool softener will usually help or prevent this problem from occurring.  A mild laxative (Milk of Magnesia or Miralax) should be taken according to package directions if there are no bowel movements after 48 hours. °7.  You may have steri-strips (small skin tapes) in place directly over the incision.  These strips should be left on the skin for 7-10 days.  If your  surgeon used skin glue on the incision, you may shower in 24 hours.  The glue will flake off over the next 2-3 weeks.  Any sutures or staples will be removed at the office during your follow-up visit. You may find that a light gauze bandage over your incision may keep your staples from being rubbed or pulled. You may shower and replace the bandage daily. °8. ACTIVITIES:  You may resume regular (light) daily activities beginning the next day--such as daily self-care, walking, climbing stairs--gradually increasing activities as tolerated.  You may have sexual intercourse when it is comfortable.  Refrain from any heavy lifting or straining until approved by your doctor. °a. You may drive when you no longer are taking prescription pain medication, you can comfortably wear a seatbelt, and you can safely maneuver your car and apply brakes °b. Return to Work: ___________________________________ °9. You should see your doctor in the office for a follow-up appointment approximately two weeks after your surgery.  Make sure that you call for this appointment within a day or two after you arrive home to insure a convenient appointment time. °OTHER INSTRUCTIONS:  °_____________________________________________________________ °_____________________________________________________________ ° °WHEN TO CALL YOUR DOCTOR: °1. Fever over 101.0 °2. Inability to urinate °3. Nausea and/or vomiting °4. Extreme swelling or bruising °5. Continued bleeding from incision. °6. Increased pain, redness, or drainage from the incision. °7. Difficulty swallowing or breathing °8. Muscle cramping or spasms. °9. Numbness or tingling in hands or feet or around lips. ° °The clinic staff is available to   answer your questions during regular business hours.  Please don’t hesitate to call and ask to speak to one of the nurses if you have concerns. ° °For further questions, please visit [email or web address] ° °Vacuum-Assisted Closure  Therapy °Vacuum-assisted closure (VAC) therapy uses a device that removes fluid and germs from wounds to help them heal. It is used on wounds that cannot be closed with stitches. They often heal slowly. Vacuum-assisted therapy helps the wound stay clean and healthy while the open wound slowly grows back together. °Vacuum-assisted closure therapy uses a bandage (dressing) that is made of foam. It is put inside the wound. Then, a drape is placed over the wound. This drape sticks to your skin to keep air out, and to protect the wound. A tube is hooked up to a small pump and is attached to the drape. The pump sucks out the fluid and germs. Vacuum-assisted closure therapy can also help reduce the bad smell that comes from the wound. °HOW DOES IT WORK?  °The vacuum pump pulls fluid through the foam dressing. The dressing may wrinkle during this process. The fluid goes into the tube and away from the wound. The fluid then goes into a container. The fluid in the container must be replaced if it is full or at least once a week, even if the container is not full. The pulling from the pump helps to close the wound and bring better circulation to the wound area.  The foam dressing covers and protects the wound. It helps your wound heal faster.  °HOW DOES IT FEEL?  °· You might feel a little pulling when the pump is on. °· You might also feel a mild vibrating sensation.   °· You might feel some discomfort when the dressing is taken off. °CAN I MOVE AROUND WITH VACUUM-ASSISTED CLOSURE THERAPY? °Yes, it has a backup battery which is used when the machine is not plugged in, as long as the battery is working, you can move freely. °WHAT ARE SOME THINGS I MUST KNOW? °· Do not turn off the pump yourself, unless instructed to do so by your healthcare provider, such as for bathing. °· Do not take off the dressing yourself, unless instructed to do so by your caregiver. °· You can wash or shower with the dressing. However, do not take the  pump into the shower. Make sure the wound dressing is protected and covered with plastic. The wound area must stay dry. °· Do not turn off the pump for more than 2 hours. If the pump is off for more than 2 hours, your nurse must change your dressing. °· Check frequently that the machine is on, that the machine indicates the therapy is on, and that all clamps are open. °THE ALARM IS SOUNDING! WHAT SHOULD I DO?  °· Stay calm. °· Do not turn off the pump or do anything with the dressing. °· Call your clinic or caregiver right away if the alarm goes off and you cannot fix the problem. Some reasons the alarm might go off include: °¨ The fluid collection container is full. °¨ The battery is low. °¨ The dressing has a leak. °· Explain to your caregiver what is happening. Follow the instructions you receive. °WHEN SHOULD I CALL FOR HELP?  °· You have severe pain. °· You have difficulty breathing. °· You have bleeding that will not stop. °· Your wound smells bad. °· You have redness, swelling, or fluid leaking from your wound. °· Your alarm goes off and you do not   know what to do.  You have a fever.  Your wound itches severely.  Your dressing changes are often painful or bleeding often occurs.  You have diarrhea.  You have a sore throat.  You have a rash around the dressing or anywhere else on your body.  You feel nauseous.  You feel dizzy or weak.  [facility] machine has been off for more than 2 hours. HOW DO I GET READY TO GO HOME WITH A PUMP?  A trained caregiver will talk to you and answer your questions about your vacuum-assisted closure therapy before you go home. He or she will explain what to expect. A caregiver will come to your home to apply the pump and care for your wound. The at-home caregiver will be available for questions and will come back for the scheduled dressing changes, usually every 48-72 hours (or more often for severely infected wounds). Your at-home caregiver will also come if you  are having an unexpected problem. If you have questions or do not know what to do when you go home, talk to your healthcare provider. Document Released: 12/13/2007 Document Revised: 09/01/2012 Document Reviewed: 12/13/2010 [facility] Patient Information 2015 [place], [place]. This information is not intended to replace advice given to you by your health care provider. Make sure you discuss any questions you have with your health care provider.   Cardiac Diet A cardiac diet can help stop heart disease or a stroke from happening. It involves eating less unhealthy fats and eating more healthy fats.  FOODS TO AVOID OR LIMIT  Limit saturated fats. This type of fat is found in oils and dairy products, such as:  Coconut oil.  Palm oil.  Cocoa butter.  Butter.  Avoid trans-fat or hydrogenated oils. These are found in fried or pre-made baked goods, such as:  Margarine.  Pre-made cookies, cakes, and crackers.  Limit processed meats (hot dogs, deli meats, sausage) to 3 ounces a week.  Limit high-fat meats (marbled meats, fried chicken, or chicken with skin) to 3 ounces a week.  Limit salt (sodium) to 1500 milligrams a day.   Limit sweets and drinks with added sugar to no more than 5 servings a week. One serving is:  1 tablespoon of sugar.  1 tablespoon of jelly or jam.   cup sorbet.  1 cup lemonade.   cup regular soda. EAT MORE OF THE FOLLOWING FOODS Fruit  Eat 4to 5 servings a day. One serving of fruit is:  1 medium whole fruit.   cup dried fruit.   cup of fresh, frozen, or canned fruit.   cup 100% fruit juice. Vegetables  Eat 4 to 5 servings a day. One serving is:  1 cup raw leafy vegetables.   cup raw or cooked, cut-up vegetables.   cup vegetable juice. Whole Grains  Eat 3 servings a day (1 ounce equals 1 serving). Legumes (such as beans, peas, and lentils)   Eat at least 4 servings a week ( cup equals 1 serving). Nuts and Seeds   Eat at least 4  servings a week ( cup equals 1 serving). Dietary Fiber  Eat 20 to 30 grams a day. Some foods high in dietary fiber include:  Dried beans.  Citrus fruits.  Apples, bananas.  Broccoli, Brussels sprouts, and eggplant.  Oats. Omega-3 Fats  Eat food with omega-3 fats. You can also take a dietary pill (supplement) that has 1 gram of DHA and EPA. Have 3.5 ounces of fatty fish a week, such as:  Salmon.  Mackerel.  [place] tuna.  Sardines.  Lake trout.  Herring. PREPARING YOUR FOOD  Broil, bake, steam, or roast foods. Do not fry food. Do not cook food in butter (fat).  Use non-stick cooking sprays.  Remove skin from poultry, such as chicken and [place].  Remove fat from meat.  Take the fat off the top of stews, soups, and gravy.  Use lemon or herbs to flavor food instead of using butter or margarine.  Use nonfat yogurt, salsa, or low-fat dressings for salads. Document Released: 07/01/2011 Document Reviewed: 07/01/2011 [facility] Patient Information 2015 [place]. This information is not intended to replace advice given to you by your health care provider. Make sure you discuss any questions you have with your health care provider. Colostomy Home Guide A colostomy is an opening for stool to leave your body when a medical condition prevents it from leaving through the usual opening (rectum). During a surgery, a piece of large intestine (colon) is brought through a hole in the abdominal wall. The new opening is called a stoma or ostomy. A bag or pouch fits over the stoma to catch stool and gas. Your stool may be liquid, somewhat pasty, or formed. CARING FOR YOUR STOMA  Normally, the stoma looks a lot like the inside of your cheek: pink, red, and moist. At first it may be swollen, but this swelling will decrease within 6 weeks. Keep the skin around your stoma clean and dry. You can gently wash your stoma and the skin around your stoma in the shower with a clean, soft  washcloth. If you develop any skin irritation, your caregiver may give you a stoma powder or ointment to help heal the area. Do not use any products other than those specifically given to you by your caregiver.  Your stoma should not be uncomfortable. If you notice any stinging or burning, your pouch may be leaking, and the skin around your stoma may be coming into contact with stool. This can cause skin irritation. If you notice stinging, replace your pouch with a new one and discard the old one. OSTOMY POUCHES  The pouch that fits over the ostomy can be made up of either 1 or 2 pieces. A one-piece pouch has a skin barrier piece and the pouch itself in one unit. A two-piece pouch has a skin barrier with a separate pouch that snaps on and off of the skin barrier. Either way, you should empty the pouch when it is only  to  full. Do not let more stool or gas build up. This could cause the pouch to leak. Some ostomy bags have a built-in gas release valve. Ostomy deodorizer (5 drops) can be put into the pouch to prevent odor. Some people use ostomy lubricant drops inside the pouch to help the stool slide out of the bag more easily and completely.  EMPTYING YOUR OSTOMY POUCH  You may get lessons on how to empty your pouch from a wound-ostomy nurse before you leave the hospital. Here are the basic steps:  Wash your hands with soap and water.  Sit far back on the toilet.  Put several pieces of toilet paper into the toilet water. This will prevent splashing as you empty the stool into the toilet bowl.  Unclip or unvelcro the tail end of the pouch.  Unroll the tail and empty stool into the toilet.  Clean the tail with toilet paper.  Reroll the tail, and clip or velcro it closed.  Wash your  hands again. CHANGING YOUR OSTOMY POUCH  Change your ostomy pouch about every 3 to 4 days for the first 6 weeks, then every 5 to7 days. Always change the bag sooner if there is any leakage or you begin to notice  any discomfort or irritation of the skin around the stoma. When possible, plan to change your ostomy pouch before eating or drinking as this will lessen the chance of stool coming out during the pouch change. A wound-ostomy nurse may teach you how to change your pouch before you leave the hospital. Here are the basic steps:  Lay out your supplies.  Wash your hands with soap and water.  Carefully remove the old pouch.  Wash the stoma and allow it to dry. Men may be advised to shave any hair around the stoma very carefully. This will make the adhesive stick better.  Use the stoma measuring guide that comes with your pouch set to decide what size hole you will need to cut in the skin barrier piece. Choose the smallest possible size that will hold the stoma but will not touch it.  Use the guide to trace the circle on the back of the skin barrier piece. Cut out the hole.  Hold the skin barrier piece over the stoma to make sure the hole is the correct size.  Remove the adhesive paper backing from the skin barrier piece.  Squeeze stoma paste around the opening of the skin barrier piece.  Clean and dry the skin around the stoma again.  Carefully fit the skin barrier piece over your stoma.  If you are using a two-piece pouch, snap the pouch onto the skin barrier piece.  Close the tail of the pouch.  Put your hand over the top of the skin barrier piece to help warm it for about 5 minutes, so that it conforms to your body better.  Wash your hands again. DIET TIPS   Continue to follow your usual diet.  Drink about eight 8 oz glasses of water each day.  You can prevent gas by eating slowly and chewing your food thoroughly.  If you feel concerned that you have too much gas, you can cut back on gas-producing foods, such as:  Spicy foods.  Onions and garlic.  Cruciferous vegetables (cabbage, broccoli, cauliflower, Brussels sprouts).  Beans and legumes.  Some  cheeses.  Eggs.  Fish.  Bubbly (carbonated) drinks.  Chewing gum. GENERAL TIPS   You can shower with or without the bag in place.  Always keep the bag on if you are bathing or swimming.  If your bag gets wet, you can dry it with a blow-dryer set to cool.  Avoid wearing tight clothing directly over your stoma so that it does not become irritated or bleed. Tight clothing can also prevent stool from draining into the pouch.  It is helpful to always have an extra skin barrier and pouch with you when traveling. Do not leave them anywhere too warm, as parts of them can melt.  Do not let your seat belt rest on your stoma. Try to keep the seat belt either above or below your stoma, or use a tiny pillow to cushion it.  You can still participate in sports, but you should avoid activities in which there is a risk of getting hit in the abdomen.  You can still have sex. It is a good idea to empty your pouch prior to sex. Some people and their partners feel very comfortable seeing the pouch  during sex. Others choose to wear lingerie or a T-shirt that covers the device. SEEK IMMEDIATE MEDICAL CARE IF:  You notice a change in the size or color of the stoma, especially if it becomes very red, purple, black, or pale white.  You have bloody stools or bleeding from the stoma.  You have abdominal pain, nausea, vomiting, or bloating.  There is anything unusual protruding from the stoma.  You have irritation or red skin around the stoma.  No stool is passing from the stoma.  You have diarrhea (requiring more frequent than normal pouch emptying). Document Released: 01/02/2003 Document Revised: 03/24/2011 Document Reviewed: 05/29/2010 [facility] Patient Information 2015 [place], [place]. This information is not intended to replace advice given to you by your health care provider. Make sure you discuss any questions you have with your health care provider.

## 2014-08-12 NOTE — Discharge Summary (Addendum)
PATIENT DETAILS Name: [name] Age: 59 y.o. Sex: male Date of Birth: 11/10/1955 MRN: [id number]. Admitting Physician: Collene Gobble, MD WUJ:[id number], Luane School, NP  Admit Date: 08/04/2014 Discharge date: 08/12/2014  Recommendations for Outpatient Follow-up:  1. Recheck CBC and BMET in 1 week 2. Ensure follow up with Dr Jake Seats in 1 week  PRIMARY DISCHARGE DIAGNOSIS:  Principal Problem:   NSTEMI (non-ST elevated myocardial infarction) Active Problems:   CAD-hx of CABG '06, DES LCX 01/2013, DES SVG-LAD 5/21/5 at [facility]   PAD (peripheral artery disease)   Bowel perforation   Troponin level elevated   Other emphysema   Cardiac ischemia   Perforated bowel   Alcohol abuse   Peritonitis   Hypokalemia   Hypomagnesemia      PAST MEDICAL HISTORY: Past Medical History  Diagnosis Date  . Carotid stenosis   . Coronary artery disease     s/p CABG 2006; stenting to vein graft 05-2013; NSTEMI 06-2013 in setting of PEA arrest  . Subclavian artery stenosis, left     s/p stenting 2006  . Hypertension   . Hyperlipidemia   . Ischemic cardiomyopathy     echo 06/2013 EF 10-15% (normal 01/2013)  . At risk for sudden cardiac death, has lifevest at discharge 06/20/2013  . Shock liver 06/20/2013  . Acute MI, troponin > 20, no obvious culprit vessel 06/20/2013  . Hypothermia, induced, post arrest 06/20/2013  . Acute encephalopathy, improved 06/20/2013  . Dizziness 06/20/2013  . Weakness due to cardiac arrest 06/20/2013  . Fever, possible aspiration-treated with antibiotics 06/20/2013  . Tobacco abuse     >100 pack year history   . Dyslipidemia 10/09/2013    [facility] Study atorvastatin -eIRB # [id number] tablet Take 40 mg by mouth daily.   Marland Kitchen PAD (peripheral artery disease) 10/09/2013    Occluded left common carotid and moderate right internal carotid artery stenosis. Previous stent to the left subclavian artery. Right iliac stent. Bilateral 60-99% renal artery stenosis    . Heart failure, acute on  chronic, systolic and diastolic 29/56/2130  . Anxiety   . GERD (gastroesophageal reflux disease)   . 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 Qty: 1 Inhaler, Refills: 0      CONTINUE these medications which have CHANGED   Details  carvedilol (COREG) 12.5 MG tablet Take 0.5 tablets (6.25 mg total) by mouth 2 (two) times daily. Qty: 60 tablet, Refills: 0    oxyCODONE (OXY IR/ROXICODONE) 5 MG immediate release tablet Take 1-2 tablets (5-10 mg total) by mouth every 4 (four) hours as needed for moderate pain. Qty: 30 tablet, Refills: 0      CONTINUE these medications which have NOT CHANGED   Details  ALPRAZolam (XANAX) 0.5 MG tablet Take 0.5 mg by mouth 3 (three) times daily as needed for anxiety.     aspirin EC 81 MG tablet Take 81 mg by mouth daily.    buPROPion (WELLBUTRIN XL) 300 MG 24 hr tablet Take 300 mg by mouth daily.    fentaNYL (DURAGESIC - DOSED MCG/HR) 25 MCG/HR patch Place 1 patch (25 mcg total) onto the skin every 3 (three) days. Qty: 5 patch, Refills: 0    lidocaine-prilocaine (EMLA) cream Apply to affected area  once Qty: 30 g, Refills: 3   Associated Diagnoses: Squamous cell carcinoma of larynx    Multiple Vitamins-Minerals (MULTIVITAMIN GUMMIES ADULT) CHEW Chew 1 each by mouth 2 (two) times daily.    pantoprazole (PROTONIX) 40 MG tablet TAKE 1 TABLET BY MOUTH EVERY DAY Qty: 90 tablet, Refills: 3      STOP taking these medications     furosemide (LASIX) 40 MG tablet      KRILL OIL PO      nitroGLYCERIN (NITROSTAT) 0.4 MG SL tablet      potassium chloride SA (K-DUR,KLOR-CON) 20 MEQ tablet      morphine (ROXANOL) 20 MG/ML concentrated solution         ALLERGIES:   Allergies    Allergen Reactions  . Cetuximab Anaphylaxis    Cardiac arrest 08/02/2014    BRIEF HPI:  See H&P, Labs, Consult and Test reports for all details in brief, 59 year old male with past medical history of CABG/PCI last one in 2006 with ischemic cardiomyopathy with an EF of 40-45% on 9 2015 laryngeal cancer, on radiation therapy, his oncologist on his last admission recommended no further chemotherapy, who developed allergic reaction to first treatment with cetuximab with cardiac arrest and had to be intubated. Recently discharged [facility] 08/04/2014 readmitted on the same day for pneumoperitoneum and acute cardiac ischemia.  CONSULTATIONS:   cardiology, pulmonary/intensive care and general surgery  PERTINENT RADIOLOGIC STUDIES: Nm Pet Image Initial (pi) Skull Base To Thigh  07/27/2014   CLINICAL DATA:  Initial treatment strategy for laryngeal carcinoma.  EXAM: NUCLEAR MEDICINE PET SKULL BASE TO THIGH  TECHNIQUE: 8.6 mCi F-18 FDG was injected intravenously. Full-ring PET imaging was performed from the skull base to thigh after the radiotracer. CT data was obtained and used for attenuation correction and anatomic localization.  FASTING BLOOD GLUCOSE:  Value: 111 mg/dl  COMPARISON:  Neck CT 06/22/2014  FINDINGS: NECK  There is a long segment of hypermetabolic activity centered in the left larynx with intense metabolic activity (SUV max equal 15.1. Activity just crosses midline anteriorly.  There is a single mildly hypermetabolic left level 2 lymph node which is small measuring 7 mm short axis on image 30, series 4 with SUV max of 2.0. No clear additional hypermetabolic cervical lymph nodes are present.  CHEST  No hypermetabolic supraclavicular nodes. No hypermetabolic mediastinal nodes. Single very small nodule in the right upper lobe measures 4 mm on image 77, series 4.  ABDOMEN/PELVIS  No abnormal hypermetabolic activity within the liver, pancreas, adrenal glands, or spleen. No  hypermetabolic lymph nodes in the abdomen or pelvis.  Atherosclerotic calcification aorta noted.  SKELETON  No focal hypermetabolic activity to suggest skeletal metastasis.  IMPRESSION: 1. Intense metabolic activity associated with the left laryngeal mass. 2. A single mildly hypermetabolic small left level 2 lymph node. No additional evidence of metastatic adenopathy in neck. 3. No evidence of or distant metastatic adenopathy in the thorax. 4. Single small right upper lobe pulmonary nodule is likely benign. Recommend attention on follow-up   Electronically Signed   By: Suzy Bouchard M.D.   On: 07/27/2014 16:53   Ir Fluoro Guide Cv Line Right  07/28/2014   CLINICAL DATA:  Squamous cell carcinoma of the larynx  EXAM: RIGHT INTERNAL JUGULAR SINGLE LUMEN POWER PORT CATHETER INSERTION  Date:  7/15/20167/15/2016 3:42 pm  Radiologist:  M. Daryll Brod, MD  Guidance:  Ultrasound fluoroscopic  FLUOROSCOPY TIME:  30 seconds, 18 mGy  MEDICATIONS AND MEDICAL HISTORY: 2  g Ancef,administered within 1 hour of the procedure.6 mg Versed, 100 mcg fentanyl  ANESTHESIA/SEDATION: 27 minutes  CONTRAST:  None  COMPLICATIONS: None  PROCEDURE: Informed consent was obtained from the patient following explanation of the procedure, risks, benefits and alternatives. The patient understands, agrees and consents for the procedure. All questions were addressed. A time out was performed.  Maximal barrier sterile technique utilized including caps, mask, sterile gowns, sterile gloves, large sterile drape, hand hygiene, and 2% chlorhexidine scrub.  Under sterile conditions and local anesthesia, right internal jugular micropuncture venous access was performed. Access was performed with ultrasound. Images were obtained for documentation. A guide wire was inserted followed by a transitional dilator. This allowed insertion of a guide wire and catheter into the IVC. Measurements were obtained from the SVC / RA junction back to the right IJ venotomy  site. In the right infraclavicular chest, a subcutaneous pocket was created over the second anterior rib. This was done under sterile conditions and local anesthesia. 1% lidocaine with epinephrine was utilized for this. A 2.5 cm incision was made in the skin. Blunt dissection was performed to create a subcutaneous pocket over the right pectoralis major muscle. The pocket was flushed with saline vigorously. There was adequate hemostasis. The port catheter was assembled and checked for leakage. The port catheter was secured in the pocket with two retention sutures. The tubing was tunneled subcutaneously to the right venotomy site and inserted into the SVC/RA junction through a valved peel-away sheath. Position was confirmed with fluoroscopy. Images were obtained for documentation. The patient tolerated the procedure well. No immediate complications. Incisions were closed in a two layer fashion with 4 - 0 Vicryl suture. Dermabond was applied to the skin. The port catheter was accessed, blood was aspirated followed by saline and heparin flushes. Needle was removed. A dry sterile dressing was applied.  IMPRESSION: Ultrasound and fluoroscopically guided right internal jugular single lumen power port catheter insertion. Tip in the SVC/RA junction. Catheter ready for use.   Electronically Signed   By: Jerilynn Mages.  Shick M.D.   On: 07/28/2014 15:46   Ir US Guide Vasc Access Right  07/28/2014   CLINICAL DATA:  Squamous cell carcinoma of the larynx  EXAM: RIGHT INTERNAL JUGULAR SINGLE LUMEN POWER PORT CATHETER INSERTION  Date:  7/15/20167/15/2016 3:42 pm  Radiologist:  M. Daryll Brod, MD  Guidance:  Ultrasound fluoroscopic  FLUOROSCOPY TIME:  30 seconds, 18 mGy  MEDICATIONS AND MEDICAL HISTORY: 2 g Ancef,administered within 1 hour of the procedure.6 mg Versed, 100 mcg fentanyl  ANESTHESIA/SEDATION: 27 minutes  CONTRAST:  None  COMPLICATIONS: None  PROCEDURE: Informed consent was obtained from the patient following explanation of  the procedure, risks, benefits and alternatives. The patient understands, agrees and consents for the procedure. All questions were addressed. A time out was performed.  Maximal barrier sterile technique utilized including caps, mask, sterile gowns, sterile gloves, large sterile drape, hand hygiene, and 2% chlorhexidine scrub.  Under sterile conditions and local anesthesia, right internal jugular micropuncture venous access was performed. Access was performed with ultrasound. Images were obtained for documentation. A guide wire was inserted followed by a transitional dilator. This allowed insertion of a guide wire and catheter into the IVC. Measurements were obtained from the SVC / RA junction back to the right IJ venotomy site. In the right infraclavicular chest, a subcutaneous pocket was created over the second anterior rib. This was done under sterile conditions and local anesthesia. 1% lidocaine with epinephrine was utilized for this. A  2.5 cm incision was made in the skin. Blunt dissection was performed to create a subcutaneous pocket over the right pectoralis major muscle. The pocket was flushed with saline vigorously. There was adequate hemostasis. The port catheter was assembled and checked for leakage. The port catheter was secured in the pocket with two retention sutures. The tubing was tunneled subcutaneously to the right venotomy site and inserted into the SVC/RA junction through a valved peel-away sheath. Position was confirmed with fluoroscopy. Images were obtained for documentation. The patient tolerated the procedure well. No immediate complications. Incisions were closed in a two layer fashion with 4 - 0 Vicryl suture. Dermabond was applied to the skin. The port catheter was accessed, blood was aspirated followed by saline and heparin flushes. Needle was removed. A dry sterile dressing was applied.  IMPRESSION: Ultrasound and fluoroscopically guided right internal jugular single lumen power port  catheter insertion. Tip in the SVC/RA junction. Catheter ready for use.   Electronically Signed   By: Jerilynn Mages.  Shick M.D.   On: 07/28/2014 15:46   Dg Chest Port 1 View  08/06/2014   CLINICAL DATA:  Endotracheal tube placement.  Initial encounter.  EXAM: PORTABLE CHEST - 1 VIEW  COMPARISON:  Chest radiograph performed 08/05/2014  FINDINGS: The patient's endotracheal tube is seen ending 4-5 cm above the carina. An enteric tube is noted extending below the diaphragm. A right-sided chest port is seen ending about the distal SVC.  Vascular congestion is noted. Right basilar airspace opacity may reflect atelectasis or possibly mild pneumonia. No definite pleural effusion or pneumothorax is seen.  The cardiomediastinal silhouette is borderline normal in size. The patient is status post median sternotomy. No acute osseous abnormalities are seen.  IMPRESSION: 1. Endotracheal tube seen ending 4-5 cm above the carina. 2. Vascular congestion noted. Right basilar airspace opacity may reflect atelectasis or possibly mild pneumonia.   Electronically Signed   By: Garald Balding M.D.   On: 08/06/2014 06:24   Dg Chest Port 1 View  08/05/2014   CLINICAL DATA:  Status post exploratory laparotomy for perforated viscus. Intubated patient.  EXAM: PORTABLE CHEST - 1 VIEW  COMPARISON:  CT chest and stress a CT chest 08/04/2014. Single view of the chest 08/03/2014.  FINDINGS: NG tube is in place with the tip in good position in the stomach. Endotracheal tube is also seen with the tip just below the clavicular heads, well above the carina. Right IJ approach Port-A-Cath is noted. Lung volumes are low with basilar atelectasis. There small bilateral pleural effusions. No pneumothorax.  IMPRESSION: Support apparatus projects in good position.  Small bilateral pleural effusions and basilar atelectasis.   Electronically Signed   By: Inge Rise M.D.   On: 08/05/2014 14:36   Dg Chest Port 1 View  08/03/2014   CLINICAL DATA:  Respiratory  failure.  Endotracheal tube position.  EXAM: PORTABLE CHEST - 1 VIEW  COMPARISON:  08/02/2014.  FINDINGS: 0441 hr. Endotracheal is approximately 3 cm above the carina. Nasogastric tube projects below the diaphragm, tip not visualized. There is a right IJ central venous catheter with its tip at the lower SVC level. The heart size and mediastinal contours are stable status post CABG. There are lower lung volumes with mildly increased left lower lobe atelectasis. No pneumothorax or significant pleural effusion identified. The bones appear unchanged.  IMPRESSION: Little change in position of the support system. Mildly increased left basilar atelectasis.   Electronically Signed   By: Richardean Sale M.D.   On:  08/03/2014 07:11   Dg Chest Portable 1 View  08/02/2014   CLINICAL DATA:  Status post CPR, intubation  EXAM: PORTABLE CHEST - 1 VIEW  COMPARISON:  None.  FINDINGS: Endotracheal tube with the tip 4 cm above the carina. Right-sided Port-A-Cath in satisfactory position.  Mild bilateral interstitial prominence. There is no focal parenchymal opacity. There is no pleural effusion or pneumothorax. The heart and mediastinal contours are unremarkable. Prior CABG.  The osseous structures are unremarkable.  IMPRESSION: 1. Endotracheal tube with the tip 4 cm above the carina.   Electronically Signed   By: Kathreen Devoid   On: 08/02/2014 10:26   Ct Angio Chest Aorta W/cm &/or Wo/cm  08/04/2014   CLINICAL DATA:  Current history of laryngeal carcinoma. Patient discharged from the hospital earlier today after a brief cardiac arrest felt to be related to use of Cetuximab, also with no urine output since 0830 hr earlier today and no bowel movement in 2 days. She presents with abdominal pain and distention.  EXAM: CT ANGIOGRAPHY CHEST, ABDOMEN AND PELVIS  TECHNIQUE: Multidetector CT imaging through the chest, abdomen and pelvis was performed using the standard protocol before and during bolus administration of intravenous  contrast. Multiplanar reconstructed images and MIPs were obtained and reviewed to evaluate the vascular anatomy.  CONTRAST:  165mL OMNIPAQUE IOHEXOL 350 MG/ML IV. Oral contrast was also administered.  COMPARISON:  PET-CT 07/27/2014.  FINDINGS: CTA CHEST FINDINGS  Unenhanced images demonstrate no evidence of mural hematoma. Stent at the origin of the left subclavian artery is again noted.  Contrast enhancement of the thoracic aorta is excellent. No evidence of thoracic aortic dissection or aneurysm. Moderate atherosclerosis. Prior CABG, and the coronary graft to a diagonal branch of the LAD does opacify. A stent is present within the coronary graft. Central pulmonary arteries are patent. 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 The left subclavian artery stent is patent. Calcified and noncalcified plaque are present in the proximal innominate artery with mild stenosis. Calcified and noncalcified plaque are present at the origin of the right vertebral artery with moderate stenosis.  Heart mildly enlarged.  No pericardial effusion.  Emphysematous changes throughout both lungs. Dependent atelectasis posteriorly in the lower lobes. Small bilateral pleural effusions. Lungs otherwise clear without confluent airspace consolidation, interstitial disease, or parenchymal nodules. Central airways patent with moderate central peribronchial thickening.  No significant mediastinal, hilar or axillary lymphadenopathy. Thyroid gland normal in appearance. Right jugular Port-A-Cath tip at the cavoatrial junction. Bone window images demonstrate osseous demineralization, mild diffuse thoracic spondylosis, multiple Schmorl's nodes, but no evidence of osseous metastatic disease.  Review of the MIP images confirms the above findings.  CTA ABDOMEN AND PELVIS FINDINGS  No evidence of abdominal aortic aneurysm or dissection. Severe atherosclerosis involving the abdominal aorta with calcified and noncalcified plaque.  High-grade stenosis at the origin of the celiac artery. Mild stenosis at the origin of the SMA. High-grade stenosis at the origin of the IMA. High-grade stenoses at the origins of the renal arteries bilaterally. Severe atherosclerosis involving the iliofemoral arteries, with high-grade stenosis suspected in the left common iliac artery and moderate stenosis in the right common iliac artery.  Pneumoperitoneum, with free air scattered throughout the abdomen and pelvis. A midline supraumbilical abdominal wall hernia is present and there are gas bubbles in the subcutaneous fat of the abdominal wall at the level of the hernia. There are acute inflammatory changes in the right upper pelvis, where there are distal ileal  loops and the mid sigmoid colon. I believe the sigmoid colon is the site of the perforation, as there is asymmetric wall thickening along its right lateral wall, and there is gas within the bowel wall. There are scattered diverticula involving the mid sigmoid colon in this region. There is no evidence of bowel obstruction. A small amount of ascites is present in the pelvis. A normal appendix is identified in the right mid abdomen.  Diffuse hepatic steatosis without focal hepatic parenchymal abnormality. Normal appearing spleen, pancreas, right adrenal gland, and kidneys. Approximate 2.3 cm nodule arising from the left adrenal gland which was not hypermetabolic on the prior PET-CT. No significant lymphadenopathy involving the abdomen or pelvis. Small hiatal hernia; stomach otherwise normal in appearance. Normal appearing urinary bladder. Prostate gland and seminal vesicles normal for age. Small left inguinal hernia containing fat.  Bone window images demonstrate degenerative changes involving the facet joints of the lower lumbar spine, degenerative changes involving the right sacroiliac joint, but no evidence of osseous metastatic disease.  Review of the MIP images confirms the above findings.  IMPRESSION:  1. Pneumoperitoneum. The origin of the free intraperitoneal air is felt to be perforated diverticulitis involving the mid sigmoid colon. 2. No evidence of thoracic or abdominal aortic dissection. 3. Generalized atherosclerosis as detailed above. 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 4. COPD/emphysema. Small bilateral pleural effusions and mild bilateral lower lobe atelectasis. No acute cardiopulmonary disease otherwise. 5. Diffuse hepatic steatosis. 6. Left adrenal adenoma. 7. Small hiatal hernia. 8. Small left inguinal hernia containing fat. 9. Midline supraumbilical abdominal wall hernia. Critical Value/emergent results were called by telephone at the time of interpretation on 08/04/2014 at 9:29 pm to Dr. Pattricia Boss, who verbally acknowledged these results.   Electronically Signed   By: Evangeline Dakin M.D.   On: 08/04/2014 21:34   Ct Angio Abd/pel W/ And/or W/o  08/04/2014   CLINICAL DATA:  Current history of laryngeal carcinoma. Patient discharged from the hospital earlier today after a brief cardiac arrest felt to be related to use of Cetuximab, also with no urine output since 0830 hr earlier today and no bowel movement in 2 days. She presents with abdominal pain and distention.  EXAM: CT ANGIOGRAPHY CHEST, ABDOMEN AND PELVIS  TECHNIQUE: Multidetector CT imaging through the chest, abdomen and pelvis was performed using the standard protocol before and during bolus administration of intravenous contrast. Multiplanar reconstructed images and MIPs were obtained and reviewed to evaluate the vascular anatomy.  CONTRAST:  178mL OMNIPAQUE IOHEXOL 350 MG/ML IV. Oral contrast was also administered.  COMPARISON:  PET-CT 07/27/2014.  FINDINGS: CTA CHEST FINDINGS  Unenhanced images demonstrate no evidence of mural hematoma. Stent at the origin of the left subclavian artery is again noted.  Contrast enhancement of the thoracic aorta is excellent. No evidence of thoracic aortic dissection or  aneurysm. Moderate atherosclerosis. Prior CABG, and the coronary graft to a diagonal branch of the LAD does opacify. A stent is present within the coronary graft. Central pulmonary arteries are patent. 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 The left subclavian artery stent is patent. Calcified and noncalcified plaque are present in the proximal innominate artery with mild stenosis. Calcified and noncalcified plaque are present at the origin of the right vertebral artery with moderate stenosis.  Heart mildly enlarged.  No pericardial effusion.  Emphysematous changes throughout both lungs. Dependent atelectasis posteriorly in the lower lobes. Small bilateral pleural effusions. Lungs otherwise  clear without confluent airspace consolidation, interstitial disease, or parenchymal nodules. Central airways patent with moderate central peribronchial thickening.  No significant mediastinal, hilar or axillary lymphadenopathy. Thyroid gland normal in appearance. Right jugular Port-A-Cath tip at the cavoatrial junction. Bone window images demonstrate osseous demineralization, mild diffuse thoracic spondylosis, multiple Schmorl's nodes, but no evidence of osseous metastatic disease.  Review of the MIP images confirms the above findings.  CTA ABDOMEN AND PELVIS FINDINGS  No evidence of abdominal aortic aneurysm or dissection. Severe atherosclerosis involving the abdominal aorta with calcified and noncalcified plaque. High-grade stenosis at the origin of the celiac artery. Mild stenosis at the origin of the SMA. High-grade stenosis at the origin of the IMA. High-grade stenoses at the origins of the renal arteries bilaterally. Severe atherosclerosis involving the iliofemoral arteries, with high-grade stenosis suspected in the left common iliac artery and moderate stenosis in the right common iliac artery.  Pneumoperitoneum, with free air scattered throughout the abdomen and pelvis. A midline  supraumbilical abdominal wall hernia is present and there are gas bubbles in the subcutaneous fat of the abdominal wall at the level of the hernia. There are acute inflammatory changes in the right upper pelvis, where there are distal ileal loops and the mid sigmoid colon. I believe the sigmoid colon is the site of the perforation, as there is asymmetric wall thickening along its right lateral wall, and there is gas within the bowel wall. There are scattered diverticula involving the mid sigmoid colon in this region. There is no evidence of bowel obstruction. A small amount of ascites is present in the pelvis. A normal appendix is identified in the right mid abdomen.  Diffuse hepatic steatosis without focal hepatic parenchymal abnormality. Normal appearing spleen, pancreas, right adrenal gland, and kidneys. Approximate 2.3 cm nodule arising from the left adrenal gland which was not hypermetabolic on the prior PET-CT. No significant lymphadenopathy involving the abdomen or pelvis. Small hiatal hernia; stomach otherwise normal in appearance. Normal appearing urinary bladder. Prostate gland and seminal vesicles normal for age. Small left inguinal hernia containing fat.  Bone window images demonstrate degenerative changes involving the facet joints of the lower lumbar spine, degenerative changes involving the right sacroiliac joint, but no evidence of osseous metastatic disease.  Review of the MIP images confirms the above findings.  IMPRESSION: 1. Pneumoperitoneum. The origin of the free intraperitoneal air is felt to be perforated diverticulitis involving the mid sigmoid colon. 2. No evidence of thoracic or abdominal aortic dissection. 3. Generalized atherosclerosis as detailed above. 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 4. COPD/emphysema. Small bilateral pleural effusions and mild bilateral lower lobe atelectasis. No acute cardiopulmonary disease otherwise. 5. Diffuse hepatic steatosis.  6. Left adrenal adenoma. 7. Small hiatal hernia. 8. Small left inguinal hernia containing fat. 9. Midline supraumbilical abdominal wall hernia. Critical Value/emergent results were called by telephone at the time of interpretation on 08/04/2014 at 9:29 pm to Dr. Pattricia Boss, who verbally acknowledged these results.   Electronically Signed   By: Evangeline Dakin M.D.   On: 08/04/2014 21:34     PERTINENT LAB RESULTS: CBC: No results for input(s): WBC, HGB, HCT, PLT in the last 72 hours. CMET CMP     Component Value Date/Time   NA 133* 08/12/2014 0257   NA 137 08/01/2014 1223   K 4.0 08/12/2014 0257   K 4.3 08/01/2014 1223   CL 100* 08/12/2014 0257   CO2 25 08/12/2014 0257   CO2 28 08/01/2014 1223  GLUCOSE 100* 08/12/2014 0257   GLUCOSE 101 08/01/2014 1223   BUN 9 08/12/2014 0257   BUN 14.1 08/01/2014 1223   CREATININE 1.03 08/12/2014 0257   CREATININE 1.1 08/01/2014 1223   CREATININE 1.29 01/11/2014 0907   CALCIUM 8.0* 08/12/2014 0257   CALCIUM 9.3 08/01/2014 1223   PROT 5.8* 08/05/2014 0115   PROT 6.7 08/01/2014 1223   ALBUMIN 3.1* 08/05/2014 0115   ALBUMIN 3.5 08/01/2014 1223   AST 38 08/05/2014 0115   AST 26 08/01/2014 1223   ALT 41 08/05/2014 0115   ALT 25 08/01/2014 1223   ALKPHOS 71 08/05/2014 0115   ALKPHOS 118 08/01/2014 1223   BILITOT 0.4 08/05/2014 0115   BILITOT 0.45 08/01/2014 1223   GFRNONAA >60 08/12/2014 0257   GFRAA >60 08/12/2014 0257    GFR Estimated Creatinine Clearance: 79.8 mL/min (by C-G formula based on Cr of 1.03). No results for input(s): LIPASE, AMYLASE in the last 72 hours. No results for input(s): CKTOTAL, CKMB, CKMBINDEX, TROPONINI in the last 72 hours. Invalid input(s): POCBNP No results for input(s): DDIMER in the last 72 hours. No results for input(s): HGBA1C in the last 72 hours. No results for input(s): CHOL, HDL, LDLCALC, TRIG, CHOLHDL, LDLDIRECT in the last 72 hours. No results for input(s): TSH, T4TOTAL, T3FREE, THYROIDAB in the  last 72 hours.  Invalid input(s): FREET3 No results for input(s): VITAMINB12, FOLATE, FERRITIN, TIBC, IRON, RETICCTPCT in the last 72 hours. Coags: No results for input(s): INR in the last 72 hours.  Invalid input(s): PT Microbiology: Recent Results (from the past 240 hour(s))  MRSA PCR Screening     Status: None   Collection Time: 08/02/14 11:24 AM  Result Value Ref Range Status   MRSA by PCR NEGATIVE NEGATIVE Final    Comment:        The GeneXpert MRSA Assay (FDA approved for NASAL specimens only), is one component of a comprehensive MRSA colonization surveillance program. It is not intended to diagnose MRSA infection nor to guide or monitor treatment for MRSA infections.   Culture, blood (routine x 2)     Status: None   Collection Time: 08/05/14  1:15 AM  Result Value Ref Range Status   Specimen Description BLOOD LEFT ARM  Final   Special Requests BOTTLES DRAWN AEROBIC ONLY 10CC  Final   Culture   Final    NO GROWTH 5 DAYS Performed at [facility]    Report Status 08/10/2014 FINAL  Final  Culture, blood (routine x 2)     Status: None   Collection Time: 08/05/14  1:20 AM  Result Value Ref Range Status   Specimen Description BLOOD RIGHT HAND  Final   Special Requests BOTTLES DRAWN AEROBIC AND ANAEROBIC 10CC  Final   Culture   Final    NO GROWTH 5 DAYS Performed at [facility]    Report Status 08/10/2014 FINAL  Final  Surgical pcr screen     Status: None   Collection Time: 08/05/14  4:21 AM  Result Value Ref Range Status   MRSA, PCR NEGATIVE NEGATIVE Final   Staphylococcus aureus NEGATIVE NEGATIVE Final    Comment:        The Xpert SA Assay (FDA approved for NASAL specimens in patients over 87 years of age), is one component of a comprehensive surveillance program.  Test performance has been validated by [facility] for patients greater than or equal to 22 year old. It is not intended to diagnose infection nor to guide  or monitor  treatment.      BRIEF HOSPITAL COURSE:  Perforated sigmoid diverticulitis with peritonitis: Required exploratory laparotomy with sigmoid colectomy and end colostomy on 7/23. Surgery following, diet advanced-tolerating well. Was maintained on empiric Zosyn, since patient has completed a course-surgery does not recommend any further antibiotics. HHRN arranged for ostomy care and VAC care. Patient instructed to follow up with Dr [name] from gen surgery in 1 week.   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 Discussed with with surgery, ok restart aspirin on discharge. 2-D echocardiogram showed preserved ejection fraction of around 50-55%.  Ventilator dependent respiratory failure postoperatively: Remained intubated postoperatively on 7/23, extubated on 7/24. Respiratory failure has now resolved, currently on room air.  History of suspected COPD: Continue with as needed bronchodilators. PCCM follow up as outpatient for PFTs.  Recent history of cardiac arrest: Patient had a recent admission a few days prior to this admission for cardiac arrest suspected to be medication related (Cetuximab)  History of Squamous Cell Carcinoma of the Larynx: Oncology following-recommendations are for no further future systemic treatment-oncology will make arrangements for follow-up with radiation oncology.  History of EtOH abuse: No signs of withdrawal. Counseled  Hypokalemia/hypomagnesemia: Resolved   TODAY-DAY OF DISCHARGE:  Subjective:   [name] today has no headache,no chest abdominal pain,no new weakness tingling or numbness, feels much better wants to go home today.   Objective:   Blood pressure 113/54, pulse 88, temperature 98.2 F (36.8 C), temperature source Oral, resp. rate 18, height 5\' 7"   (1.702 m), weight 88.225 kg (194 lb 8 oz), SpO2 98 %.  Intake/Output Summary (Last 24 hours) at 08/12/14 1058 Last data filed at 08/12/14 0551  Gross per 24 hour  Intake    390 ml  Output   1665 ml  Net  -1275 ml   Filed Weights   08/10/14 0511 08/11/14 0555 08/12/14 0614  Weight: 81.693 kg (180 lb 1.6 oz) 81.058 kg (178 lb 11.2 oz) 88.225 kg (194 lb 8 oz)    Exam Awake Alert, Oriented *3, No new F.N deficits, Normal affect [place].AT,PERRAL Supple Neck,No JVD, No cervical lymphadenopathy appriciated.  Symmetrical Chest wall movement, Good air movement bilaterally, CTAB RRR,No Gallops,Rubs or new Murmurs, No Parasternal Heave +ve B.Sounds, Abd Soft, Non tender, No organomegaly appriciated, No rebound -guarding or rigidity. No Cyanosis, Clubbing or edema, No new Rash or bruise  DISCHARGE CONDITION: Stable  DISPOSITION: Home with home health services  DISCHARGE INSTRUCTIONS:    Activity:  As tolerated   Diet recommendation: Heart Healthy diet  Discharge Instructions    Call MD for:  redness, tenderness, or signs of infection (pain, swelling, redness, odor or green/yellow discharge around incision site)    Complete by:  As directed      Diet - low sodium heart healthy    Complete by:  As directed      Increase activity slowly    Complete by:  As directed            Follow-up Information    Follow up with [facility] A, MD. Schedule an appointment as soon as possible for a visit in 3 weeks.   Specialty:  General Surgery   Contact information:   [address] [place] [address] [phone]       Follow up  with [facility].   Why:  Registered Nurse   Contact information:   [address] [place] [address] [phone]       Follow up with [facility].   Why:  [facility]   Contact information:   [address] [place] [address]  [phone]      Follow up with [facility] A, MD. Schedule an  appointment as soon as possible for a visit in 9 days.   Specialty:  General Surgery   Why:  See Dr. Coralie Keens at [facility] surgery in 9-10 days.   Contact information:   [address] [place] [address] [phone]       Follow up with [facility], MD. Schedule an appointment as soon as possible for a visit in 2 weeks.   Specialty:  Pulmonary Disease   Why:  for evaluation for COPD and Pulmonary Function test   Contact information:   [place] [address] [phone]       Follow up with [facility], NI, MD. Schedule an appointment as soon as possible for a visit in 2 weeks.   Specialty:  Hematology and Oncology   Contact information:   [place] [address] [phone]      Total Time spent on discharge equals 45 minutes.  SignedOren Binet 08/12/2014 10:58 AM

## 2014-08-14 ENCOUNTER — Ambulatory Visit: Payer: Medicaid Other

## 2014-08-15 ENCOUNTER — Telehealth: Payer: Self-pay

## 2014-08-15 ENCOUNTER — Ambulatory Visit: Payer: Medicaid Other

## 2014-08-15 DIAGNOSIS — C329 Malignant neoplasm of larynx, unspecified: Secondary | ICD-10-CM | POA: Diagnosis not present

## 2014-08-15 DIAGNOSIS — Z510 Encounter for antineoplastic radiation therapy: Secondary | ICD-10-CM | POA: Diagnosis present

## 2014-08-15 NOTE — Telephone Encounter (Signed)
Called patient to inform of hold on radiation this week in lure of medical status to allow time to recover.I will check on status next week.He was very thankful for the call and break from treatment.

## 2014-08-16 ENCOUNTER — Ambulatory Visit: Admission: RE | Admit: 2014-08-16 | Payer: Medicaid Other | Source: Ambulatory Visit

## 2014-08-16 ENCOUNTER — Ambulatory Visit: Payer: Medicaid Other | Admitting: Hematology and Oncology

## 2014-08-16 ENCOUNTER — Ambulatory Visit: Payer: Medicaid Other

## 2014-08-16 ENCOUNTER — Ambulatory Visit: Payer: Self-pay

## 2014-08-17 ENCOUNTER — Telehealth: Payer: Self-pay

## 2014-08-17 ENCOUNTER — Ambulatory Visit: Payer: Medicaid Other

## 2014-08-17 NOTE — Telephone Encounter (Signed)
Left message for patient to come in for assessment on 08/22/14 at 10:30 am to see if ready to start radiation treatment.

## 2014-08-18 ENCOUNTER — Ambulatory Visit: Payer: Medicaid Other

## 2014-08-21 ENCOUNTER — Ambulatory Visit: Payer: Medicaid Other

## 2014-08-22 ENCOUNTER — Ambulatory Visit: Payer: Medicaid Other

## 2014-08-22 ENCOUNTER — Ambulatory Visit: Admission: RE | Admit: 2014-08-22 | Payer: MEDICAID | Source: Ambulatory Visit | Admitting: Radiation Oncology

## 2014-08-22 ENCOUNTER — Ambulatory Visit (HOSPITAL_COMMUNITY)
Admission: RE | Admit: 2014-08-22 | Discharge: 2014-08-22 | Disposition: A | Discharge status: Home / Self Care | Payer: Medicaid Other | Source: Ambulatory Visit | Attending: Surgery | Admitting: Surgery

## 2014-08-22 ENCOUNTER — Encounter (HOSPITAL_COMMUNITY): Payer: Self-pay

## 2014-08-22 ENCOUNTER — Ambulatory Visit
Admission: RE | Admit: 2014-08-22 | Discharge: 2014-08-22 | Disposition: A | Discharge status: Home / Self Care | Payer: Self-pay | Source: Ambulatory Visit | Attending: Radiation Oncology | Admitting: Radiation Oncology

## 2014-08-22 ENCOUNTER — Telehealth: Payer: Self-pay | Admitting: *Deleted

## 2014-08-22 ENCOUNTER — Ambulatory Visit: Admission: RE | Admit: 2014-08-22 | Payer: Medicaid Other | Source: Ambulatory Visit

## 2014-08-22 ENCOUNTER — Encounter: Payer: Self-pay | Admitting: Radiation Oncology

## 2014-08-22 ENCOUNTER — Ambulatory Visit
Admission: RE | Admit: 2014-08-22 | Discharge: 2014-08-22 | Disposition: A | Discharge status: Home / Self Care | Payer: Medicaid Other | Source: Ambulatory Visit | Attending: Radiation Oncology | Admitting: Radiation Oncology

## 2014-08-22 ENCOUNTER — Other Ambulatory Visit: Payer: Self-pay

## 2014-08-22 ENCOUNTER — Encounter: Payer: Self-pay | Admitting: *Deleted

## 2014-08-22 ENCOUNTER — Telehealth: Payer: Self-pay

## 2014-08-22 VITALS — BP 112/69 | HR 100 | Temp 98.7°F | Resp 16 | Wt 164.4 lb

## 2014-08-22 DIAGNOSIS — K4020 Bilateral inguinal hernia, without obstruction or gangrene, not specified as recurrent: Secondary | ICD-10-CM | POA: Diagnosis not present

## 2014-08-22 DIAGNOSIS — C329 Malignant neoplasm of larynx, unspecified: Secondary | ICD-10-CM

## 2014-08-22 DIAGNOSIS — R937 Abnormal findings on diagnostic imaging of other parts of musculoskeletal system: Secondary | ICD-10-CM | POA: Diagnosis not present

## 2014-08-22 DIAGNOSIS — Z933 Colostomy status: Secondary | ICD-10-CM | POA: Insufficient documentation

## 2014-08-22 DIAGNOSIS — E46 Unspecified protein-calorie malnutrition: Secondary | ICD-10-CM | POA: Insufficient documentation

## 2014-08-22 DIAGNOSIS — R5082 Postprocedural fever: Secondary | ICD-10-CM

## 2014-08-22 DIAGNOSIS — K760 Fatty (change of) liver, not elsewhere classified: Secondary | ICD-10-CM | POA: Insufficient documentation

## 2014-08-22 DIAGNOSIS — Z9889 Other specified postprocedural states: Secondary | ICD-10-CM | POA: Insufficient documentation

## 2014-08-22 DIAGNOSIS — R509 Fever, unspecified: Secondary | ICD-10-CM

## 2014-08-22 MED ORDER — HEPARIN SOD (PORK) LOCK FLUSH 100 UNIT/ML IV SOLN
500.0000 [IU] | Freq: Once | INTRAVENOUS | Status: AC
  Administered 2014-08-22: 500 [IU] via INTRAVENOUS

## 2014-08-22 MED ORDER — IOHEXOL 300 MG/ML  SOLN
100.0000 mL | Freq: Once | INTRAMUSCULAR | Status: AC | PRN
  Administered 2014-08-22: 100 mL via INTRAVENOUS

## 2014-08-22 MED ORDER — SODIUM CHLORIDE 0.9 % IJ SOLN
10.0000 mL | Freq: Once | INTRAMUSCULAR | Status: AC
Start: 2014-08-22 — End: 2014-08-22
  Administered 2014-08-22: 10 mL via INTRAVENOUS

## 2014-08-22 MED ORDER — IOHEXOL 300 MG/ML  SOLN
50.0000 mL | Freq: Once | INTRAMUSCULAR | Status: AC | PRN
  Administered 2014-08-22: 50 mL via ORAL

## 2014-08-22 NOTE — Progress Notes (Signed)
   Department of Radiation Oncology  Phone:  [phone] Fax:        [phone]   Name: [name] MRN: [id number]  DOB: [birth year]  Date: 08/22/2014  Follow Up Visit Note  Diagnosis: Squamous cell carcinoma of larynx   Staging form: Larynx - Supraglottis, AJCC 7th Edition     Clinical stage from 07/20/2014: Stage III (T3, N0, M0) - Signed by [name], MD on 08/01/2014  Interval History: [name] presents today for routine followup.  He has been discharged from the hospital following MI and bowel perforation. He has an ostomy and a midline incision with a would vac in place. He states he is weak and mostly walking slowly and using a wheelchair.  He feels he is too weak to start radiation.  Physical Exam:  Filed Vitals:   08/22/14 1346  BP: 112/69  Pulse: 100  Temp: 98.7 F (37.1 C)  TempSrc: Oral  Resp: 16  Weight: 164 lb 6.4 oz (74.571 kg)  SpO2: 98%   Abdominal wound dressed. Alert and oriented.   IMPRESSION: [name] is a 59 y.o. male  With T3N0 SCCa of the larynx s/p 1 dose of cetuximab with complications requiring hospitalizaiton now with non healing abdominal wound, 3 weeks from simulation  PLAN:  I spoke to [place] and his friend. We discussed that I would liek to proceed with his cancer treatment [facility] RT which is 6 fractions per week) without chemotherapy. There is no clear answer on whether it is better for him to start RT now or continue healing. He does need to get stronger for sure.  I think we should wait another 2 weeks at the expense of his tumor growing. I discussed laryngetcomy with him. I scheduled him for follow up in 2 weeks. He is seeing surgery this afternoon. If it is OK with them, I will order physical therapy.     [name], MD

## 2014-08-22 NOTE — Progress Notes (Signed)
Using sterile technique per protocol, de accessed patient right porta acath after flushing with 44ml normal saline and 5 ml heparin flush  Excellent blood return, and bandaid applied over port site and 2x2 Gause over band aid, patient tolerated well  2:17 PM

## 2014-08-22 NOTE — Telephone Encounter (Signed)
  Oncology Nurse Navigator Documentation   Navigator Encounter Type: Telephone (08/22/14 1007)      Placed 3 calls to patient re: 1030 appt with Dr. Pablo Ledger and newly scheduled 1100 CT scan at [facility], spoke with him as well as caregiver/driver m-inlaw Vivien Rota.  Vivien Rota verbalized understanding they will arrive at [facility], proceed to [facility] for [facility] scan, then return to [facility] to see Dr. Pablo Ledger.  I asked that I be called upon their arrival.  Vivien Rota verbalized understanding.  [name], RN, BSN, [place] at [place] [phone]

## 2014-08-22 NOTE — Telephone Encounter (Signed)
Patient will be going for ct scan at [facility] at 11:00 am and then come to radiation for assessment afterwards to discuss treatment.

## 2014-08-23 ENCOUNTER — Ambulatory Visit: Payer: Medicaid Other | Admitting: Hematology and Oncology

## 2014-08-23 ENCOUNTER — Ambulatory Visit: Payer: Self-pay

## 2014-08-23 ENCOUNTER — Encounter: Payer: Self-pay | Admitting: Nutrition

## 2014-08-23 ENCOUNTER — Ambulatory Visit: Payer: Medicaid Other

## 2014-08-24 ENCOUNTER — Other Ambulatory Visit: Payer: Self-pay | Admitting: *Deleted

## 2014-08-24 ENCOUNTER — Other Ambulatory Visit: Payer: Self-pay | Admitting: Hematology and Oncology

## 2014-08-24 ENCOUNTER — Telehealth: Payer: Self-pay | Admitting: *Deleted

## 2014-08-24 ENCOUNTER — Ambulatory Visit: Payer: Medicaid Other

## 2014-08-24 ENCOUNTER — Telehealth: Payer: Self-pay | Admitting: Hematology and Oncology

## 2014-08-24 DIAGNOSIS — C329 Malignant neoplasm of larynx, unspecified: Secondary | ICD-10-CM

## 2014-08-24 MED ORDER — FENTANYL 25 MCG/HR TD PT72: 25.0000 ug | MEDICATED_PATCH | TRANSDERMAL | Status: DC

## 2014-08-24 NOTE — Progress Notes (Signed)
  Oncology Nurse Navigator Documentation   Navigator Encounter Type: Clinic/MDC;Other (08/24/14 1045)      To provide support and facilitate appts, met with/escorted patient prior to his [facility] for CT, later escorted to [facility] for f/u appt with Dr. Pablo Ledger.  He was accompanied by his m-in-law. Patient and Dr. Pablo Ledger in agreement he needs a couple more weeks to recover from surgery before starting RT.  Dr. Pablo Ledger to submit referral for [facility] s/p conversation with Dr. Barry Dienes.  [name], RN, BSN, [place] at [place] [phone]

## 2014-08-24 NOTE — Telephone Encounter (Signed)
Ready for pickup Also please schedule rtn appt to see me 8/23 at 1145 am, 30 mins

## 2014-08-24 NOTE — Telephone Encounter (Signed)
POF sent for appt on 09/05/14

## 2014-08-24 NOTE — Telephone Encounter (Signed)
TC from [facility] requesting refill on pt's fentanyl patch. Last filled on 08/01/14 for 5 patches.

## 2014-08-24 NOTE — Telephone Encounter (Signed)
Spoke with patient and he is aware of 09/05/14

## 2014-08-25 ENCOUNTER — Ambulatory Visit: Payer: Medicaid Other

## 2014-08-25 NOTE — Telephone Encounter (Signed)
[name] with [facility] called asking about pt fentanyl patch. The pt was not called back when rx ready for pickup. Noted Dr Alvy Bimler did print the RX on 8/11. Instructed that whoever pick up Rx needs to bring their drivers licence for ID.

## 2014-08-28 ENCOUNTER — Ambulatory Visit: Payer: Medicaid Other

## 2014-08-29 ENCOUNTER — Ambulatory Visit: Payer: Medicaid Other

## 2014-08-30 ENCOUNTER — Ambulatory Visit: Payer: Medicaid Other

## 2014-08-30 ENCOUNTER — Ambulatory Visit: Payer: Self-pay

## 2014-08-31 ENCOUNTER — Ambulatory Visit: Payer: Medicaid Other

## 2014-09-01 ENCOUNTER — Telehealth: Payer: Self-pay | Admitting: *Deleted

## 2014-09-01 ENCOUNTER — Ambulatory Visit: Payer: Medicaid Other

## 2014-09-01 NOTE — Telephone Encounter (Signed)
  Oncology Nurse Navigator Documentation   Navigator Encounter Type: Telephone (09/01/14 1528)      Called patient to check on well-being. He reported:  Had just returned earlier from seeing Dr. [name] for surgical follow-up.  Was told he should be well healed in 2-3 weeks.  "Feels good" but still weak.  His is building stamina by walking inside home.  Declining appetite, "food doesn't taste good".  Staying well hydrated, drinking "gallons of water", urinating ca every 2 hours.  Still not sure if strong enough to begin RT next week. He verbalized understanding of appt with Dr. Pablo Ledger next Tuesday at 10:30, that 11:00 appt scheduled to initiate RT if he feels he can.  [name], RN, BSN, [place] at [place] [phone]

## 2014-09-04 ENCOUNTER — Ambulatory Visit: Payer: Medicaid Other

## 2014-09-05 ENCOUNTER — Ambulatory Visit (HOSPITAL_BASED_OUTPATIENT_CLINIC_OR_DEPARTMENT_OTHER): Payer: Medicaid Other | Admitting: Hematology and Oncology

## 2014-09-05 ENCOUNTER — Ambulatory Visit
Admission: RE | Admit: 2014-09-05 | Discharge: 2014-09-05 | Disposition: A | Discharge status: Home / Self Care | Payer: Medicaid Other | Source: Ambulatory Visit | Attending: Radiation Oncology | Admitting: Radiation Oncology

## 2014-09-05 ENCOUNTER — Encounter: Payer: Self-pay | Admitting: *Deleted

## 2014-09-05 ENCOUNTER — Telehealth: Payer: Self-pay | Admitting: Hematology and Oncology

## 2014-09-05 VITALS — BP 127/81 | HR 86 | Temp 98.1°F | Resp 18 | Ht 67.0 in | Wt 157.1 lb

## 2014-09-05 VITALS — BP 127/81 | HR 86 | Temp 98.0°F | Wt 157.7 lb

## 2014-09-05 DIAGNOSIS — R070 Pain in throat: Secondary | ICD-10-CM | POA: Diagnosis not present

## 2014-09-05 DIAGNOSIS — C321 Malignant neoplasm of supraglottis: Secondary | ICD-10-CM | POA: Diagnosis present

## 2014-09-05 DIAGNOSIS — C329 Malignant neoplasm of larynx, unspecified: Secondary | ICD-10-CM | POA: Diagnosis not present

## 2014-09-05 DIAGNOSIS — I255 Ischemic cardiomyopathy: Secondary | ICD-10-CM

## 2014-09-05 DIAGNOSIS — Z510 Encounter for antineoplastic radiation therapy: Secondary | ICD-10-CM | POA: Diagnosis not present

## 2014-09-05 MED ORDER — MORPHINE SULFATE (CONCENTRATE) 10 MG /0.5 ML PO SOLN: 20.0000 mg | ORAL | Status: DC | PRN

## 2014-09-05 MED ORDER — FENTANYL 12 MCG/HR TD PT72: 12.0000 ug | MEDICATED_PATCH | TRANSDERMAL | Status: DC

## 2014-09-05 MED ORDER — FENTANYL 25 MCG/HR TD PT72: 25.0000 ug | MEDICATED_PATCH | TRANSDERMAL | Status: DC

## 2014-09-05 MED ORDER — BIAFINE EX EMUL
CUTANEOUS | Status: DC | PRN
  Administered 2014-09-05: 14:00:00 via TOPICAL

## 2014-09-05 MED ORDER — LIDOCAINE-PRILOCAINE 2.5-2.5 % EX CREA: 1.0000 "application " | TOPICAL_CREAM | CUTANEOUS | Status: DC

## 2014-09-05 NOTE — Progress Notes (Signed)
Patient  For assessment prior to starting first day of radiation.Has some discomfort of left neck.Looks much better and stronger.

## 2014-09-05 NOTE — Telephone Encounter (Signed)
per pof to sch pt appt-gave pt calendar per [facility] and avs

## 2014-09-05 NOTE — Progress Notes (Signed)
Weekly Management Note Current Dose: 2 Gy  Projected Dose:70 Gy   Narrative:  The patient presents for routine under treatment assessment.  CBCT/MVCT images/Port film x-rays were reviewed.  The chart was checked. I saw him prior to his first fraction today.  He is doing well. Walking more but still with a walker.  Still has a wound vac and ostomy. 2-3 more weeks of healing per his surgeon. Food tastes salty.   Physical Findings:  Alert, oriented, hoarse.   Vitals:  Filed Vitals:   09/05/14 1023  BP: 127/81  Pulse: 86  Temp: 98 F (36.7 C)   Weight:  Wt Readings from Last 3 Encounters:  09/05/14 157 lb 11.2 oz (71.532 kg)  08/22/14 164 lb 6.4 oz (74.571 kg)  08/12/14 194 lb 8 oz (88.225 kg)   Lab Results  Component Value Date   WBC 8.9 08/09/2014   HGB 10.9* 08/09/2014   HCT 33.0* 08/09/2014   MCV 91.9 08/09/2014   PLT 254 08/09/2014   Lab Results  Component Value Date   CREATININE 1.03 08/12/2014   BUN 9 08/12/2014   NA 133* 08/12/2014   K 4.0 08/12/2014   CL 100* 08/12/2014   CO2 25 08/12/2014     Impression/Plan: patient will start RT today. Discussed need for maintaining po intake. Will ask dietician to follow.

## 2014-09-06 ENCOUNTER — Ambulatory Visit: Payer: Self-pay

## 2014-09-06 ENCOUNTER — Encounter: Payer: Self-pay | Admitting: Hematology and Oncology

## 2014-09-06 ENCOUNTER — Ambulatory Visit
Admission: RE | Admit: 2014-09-06 | Discharge: 2014-09-06 | Disposition: A | Discharge status: Home / Self Care | Payer: Medicaid Other | Source: Ambulatory Visit | Attending: Radiation Oncology | Admitting: Radiation Oncology

## 2014-09-06 DIAGNOSIS — Z510 Encounter for antineoplastic radiation therapy: Secondary | ICD-10-CM | POA: Diagnosis not present

## 2014-09-06 DIAGNOSIS — R070 Pain in throat: Secondary | ICD-10-CM | POA: Insufficient documentation

## 2014-09-06 NOTE — Progress Notes (Signed)
[place] OFFICE PROGRESS NOTE  Patient Care Team: [name], NP as PCP - General [name], RN as Oncology Nurse Navigator [name], MD as Consulting Physician (Radiation Oncology) [name], MD as Consulting Physician (Hematology and Oncology) [name], RD as Dietitian (Nutrition)  SUMMARY OF ONCOLOGIC HISTORY: Oncology History   Squamous cell carcinoma of larynx   Staging form: Larynx - Supraglottis, AJCC 7th Edition     Clinical stage from 07/20/2014: Stage III (T3, N0, M0) - Signed by [name], MD on 08/01/2014       Squamous cell carcinoma of larynx   06/22/2014 Imaging CT scan showed greater than 2 cm supraglottic squamous cell carcinoma of the larynx with contralateral extension. No regional pathologic adenopathy   07/12/2014 Pathology Results Accession: [id number] biopsy of larynx show squamous cell carcinoma   07/12/2014 Surgery Laryngoscopy revealed a large exophytic tumor arising from the left supraglottic larynx, aryepiglottic fold. It extended from the anterior commissure and the epiglottis anteriorly, to the arytenoid posteriorly. The entire aryepiglottic fold was involved   07/27/2014 Imaging Staging PET:  1. Intense metabolic activity associated with the left laryngeal mass.  2. Single mildly hypermetabolic small left level 2 lymph node.  No additional evidence of metastatic adenopathy in neck.  3. No evidence of thoracic metastasis.   07/28/2014 Procedure Port-a-cath placed.   08/02/2014 - 08/04/2014 Hospital Admission Admitted to ICU following allergic rxt to Cetuximab resulting in cardiac arrest   08/02/2014 Adverse Reaction The patient received a small amount of cetuximab resulting in cardiac arrest. The medication was permanently discontinued   08/04/2014 Imaging CT scan of abdomen showed neumoperitoneum. 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     INTERVAL HISTORY: Please see below for problem oriented charting. He returns today for symptom management. He complained of persistent sore throat/left neck pain. His pain is poorly controlled with current pain regimen. He rated his pain at 5-7 out of 10. He had very mild difficulties with swallowing. He is currently has wound VAC on his abdominal wall to heal his abdominal wound. He denies any chest pain, SOB or leg swelling  REVIEW OF SYSTEMS:   Constitutional: Denies fevers, chills or abnormal weight loss Eyes: Denies blurriness of vision Respiratory: Denies cough, dyspnea or wheezes Cardiovascular: Denies palpitation, chest discomfort or lower extremity swelling Gastrointestinal:  Denies nausea, heartburn or change in bowel habits Skin: Denies abnormal skin rashes Lymphatics: Denies new lymphadenopathy or easy bruising Neurological:Denies numbness, tingling or new weaknesses Behavioral/Psych: Mood is stable, no new changes  All other systems were reviewed with the patient and are negative.  I have reviewed the past medical history, past surgical history, social history and family history with the patient and they are unchanged from previous note.  ALLERGIES:  is allergic to cetuximab.  MEDICATIONS:  Current Outpatient Prescriptions  Medication Sig Dispense Refill  . ALPRAZolam (XANAX) 0.5 MG tablet Take 0.5 mg by mouth 3 (three) times daily as needed for anxiety.     Marland Kitchen aspirin EC 81 MG tablet Take 81 mg by mouth daily.    Marland Kitchen buPROPion (WELLBUTRIN XL) 300 MG 24 hr tablet Take 300 mg by mouth  daily.    . carvedilol (COREG) 12.5 MG tablet Take 0.5 tablets (6.25 mg total) by mouth 2 (two)  times daily. 60 tablet 0  . fentaNYL (DURAGESIC - DOSED MCG/HR) 25 MCG/HR patch Place 1 patch (25 mcg total) onto the skin every 3 (three) days. 5 patch 0  . lidocaine-prilocaine (EMLA) cream Apply 1 application topically as directed. Apply to affected area once 30 g 3  . Multiple Vitamins-Minerals (MULTIVITAMIN GUMMIES ADULT) CHEW Chew 1 each by mouth 2 (two) times daily.    Marland Kitchen oxyCODONE (OXY IR/ROXICODONE) 5 MG immediate release tablet Take 1-2 tablets (5-10 mg total) by mouth every 4 (four) hours as needed for moderate pain. 30 tablet 0  . pantoprazole (PROTONIX) 40 MG tablet TAKE 1 TABLET BY MOUTH EVERY DAY 90 tablet 3  . potassium chloride SA (K-DUR,KLOR-CON) 20 MEQ tablet TK 1 T PO QD  1  . albuterol (PROVENTIL HFA;VENTOLIN HFA) 108 (90 BASE) MCG/ACT inhaler Inhale 2 puffs into the lungs every 6 (six) hours as needed for wheezing or shortness of breath. (Patient not taking: Reported on 08/22/2014) 1 Inhaler 0  . fentaNYL (DURAGESIC - DOSED MCG/HR) 12 MCG/HR Place 1 patch (12.5 mcg total) onto the skin every 3 (three) days. 5 patch 0  . Morphine Sulfate (MORPHINE CONCENTRATE) 10 mg / 0.5 ml concentrated solution Take 1 mL (20 mg total) by mouth every 2 (two) hours as needed for severe pain. 240 mL 0   No current facility-administered medications for this visit.    PHYSICAL EXAMINATION: ECOG PERFORMANCE STATUS: 1 - Symptomatic but completely ambulatory  Filed Vitals:   09/05/14 1145  BP: 127/81  Pulse: 86  Temp: 98.1 F (36.7 C)  Resp: 18   Filed Weights   09/05/14 1145  Weight: 157 lb 1.6 oz (71.26 kg)    GENERAL:alert, no distress and comfortable SKIN: skin color, texture, turgor are normal, no rashes or significant lesions EYES: normal, Conjunctiva are pink and non-injected, sclera clear OROPHARYNX:no exudate, no erythema and lips, buccal mucosa, and tongue normal  NECK: supple, thyroid normal size, non-tender, without nodularity LYMPH:  no palpable lymphadenopathy in the  cervical, axillary or inguinal LUNGS: clear to auscultation and percussion with normal breathing effort HEART: regular rate & rhythm and no murmurs and no lower extremity edema ABDOMEN:abdomen soft, well-healed surgical scar with wound VAC in situ Musculoskeletal:no cyanosis of digits and no clubbing  NEURO: alert & oriented x 3 with fluent speech, no focal motor/sensory deficits  LABORATORY DATA:  I have reviewed the data as listed    Component Value Date/Time   NA 133* 08/12/2014 0257   NA 137 08/01/2014 1223   K 4.0 08/12/2014 0257   K 4.3 08/01/2014 1223   CL 100* 08/12/2014 0257   CO2 25 08/12/2014 0257   CO2 28 08/01/2014 1223   GLUCOSE 100* 08/12/2014 0257   GLUCOSE 101 08/01/2014 1223   BUN 9 08/12/2014 0257   BUN 14.1 08/01/2014 1223   CREATININE 1.03 08/12/2014 0257   CREATININE 1.1 08/01/2014 1223   CREATININE 1.29 01/11/2014 0907   CALCIUM 8.0* 08/12/2014 0257   CALCIUM 9.3 08/01/2014 1223   PROT 5.8* 08/05/2014 0115   PROT 6.7 08/01/2014 1223   ALBUMIN 3.1* 08/05/2014 0115   ALBUMIN 3.5 08/01/2014 1223   AST 38 08/05/2014 0115   AST 26 08/01/2014 1223   ALT 41 08/05/2014 0115   ALT 25 08/01/2014 1223   ALKPHOS 71 08/05/2014 0115   ALKPHOS 118 08/01/2014 1223  BILITOT 0.4 08/05/2014 0115   BILITOT 0.45 08/01/2014 1223   GFRNONAA >60 08/12/2014 0257   GFRAA >60 08/12/2014 0257    No results found for: SPEP, UPEP  Lab Results  Component Value Date   WBC 8.9 08/09/2014   NEUTROABS 6.4 08/08/2014   HGB 10.9* 08/09/2014   HCT 33.0* 08/09/2014   MCV 91.9 08/09/2014   PLT 254 08/09/2014      Chemistry      Component Value Date/Time   NA 133* 08/12/2014 0257   NA 137 08/01/2014 1223   K 4.0 08/12/2014 0257   K 4.3 08/01/2014 1223   CL 100* 08/12/2014 0257   CO2 25 08/12/2014 0257   CO2 28 08/01/2014 1223   BUN 9 08/12/2014 0257   BUN 14.1 08/01/2014 1223   CREATININE 1.03 08/12/2014 0257   CREATININE 1.1 08/01/2014 1223   CREATININE 1.29  01/11/2014 0907      Component Value Date/Time   CALCIUM 8.0* 08/12/2014 0257   CALCIUM 9.3 08/01/2014 1223   ALKPHOS 71 08/05/2014 0115   ALKPHOS 118 08/01/2014 1223   AST 38 08/05/2014 0115   AST 26 08/01/2014 1223   ALT 41 08/05/2014 0115   ALT 25 08/01/2014 1223   BILITOT 0.4 08/05/2014 0115   BILITOT 0.45 08/01/2014 1223      ASSESSMENT & PLAN:  Squamous cell carcinoma of larynx He is doing well so far with radiation therapy alone. He complained of poorly controlled left jaw pain/throat pain. He rated his pain as 7 out of 10 pain, not relieved with current pain medication regimen. He is starting to experience some difficulties with swallowing. I recommend starting him on a fentanyl patch and to switch his breakthrough pain medicine to liquid morphine sulfate so that he could swallow easier. I will bring him back next week to reassess pain control  Ischemic cardiomyopathy, EF 45% He is currently on medical management. He denies any signs and symptoms of congestive heart failure He will continue medical management.  Throat pain in adult He has poorly controlled throat pain. I will start him on fentanyl patch and switch his breakthrough pain medicine to liquid Roxanol. We discussed about the role of pain management in the oncology clinic. The prescribed pain regimen is for short term use only.   We discussed narcotic refill policy and I warned him against risk of side effects such as constipation and nausea    No orders of the defined types were placed in this encounter.   All questions were answered. The patient knows to call the clinic with any problems, questions or concerns. No barriers to learning was detected. I spent 25 minutes counseling the patient face to face. The total time spent in the appointment was 30 minutes and more than 50% was on counseling and review of test results     [facility], [place], MD 09/06/2014 8:02 AM

## 2014-09-06 NOTE — Assessment & Plan Note (Signed)
He has poorly controlled throat pain. I will start him on fentanyl patch and switch his breakthrough pain medicine to liquid Roxanol. We discussed about the role of pain management in the oncology clinic. The prescribed pain regimen is for short term use only.   We discussed narcotic refill policy and I warned him against risk of side effects such as constipation and nausea

## 2014-09-06 NOTE — Assessment & Plan Note (Signed)
He is currently on medical management. He denies any signs and symptoms of congestive heart failure He will continue medical management.

## 2014-09-06 NOTE — Assessment & Plan Note (Signed)
He is doing well so far with radiation therapy alone. He complained of poorly controlled left jaw pain/throat pain. He rated his pain as 7 out of 10 pain, not relieved with current pain medication regimen. He is starting to experience some difficulties with swallowing. I recommend starting him on a fentanyl patch and to switch his breakthrough pain medicine to liquid morphine sulfate so that he could swallow easier. I will bring him back next week to reassess pain control

## 2014-09-07 ENCOUNTER — Ambulatory Visit
Admission: RE | Admit: 2014-09-07 | Discharge: 2014-09-07 | Disposition: A | Discharge status: Home / Self Care | Payer: Medicaid Other | Source: Ambulatory Visit | Attending: Radiation Oncology | Admitting: Radiation Oncology

## 2014-09-07 DIAGNOSIS — Z510 Encounter for antineoplastic radiation therapy: Secondary | ICD-10-CM | POA: Diagnosis not present

## 2014-09-08 ENCOUNTER — Ambulatory Visit
Admission: RE | Admit: 2014-09-08 | Discharge: 2014-09-08 | Disposition: A | Discharge status: Home / Self Care | Payer: Medicaid Other | Source: Ambulatory Visit | Attending: Radiation Oncology | Admitting: Radiation Oncology

## 2014-09-08 ENCOUNTER — Ambulatory Visit: Payer: Self-pay | Admitting: Nutrition

## 2014-09-08 ENCOUNTER — Ambulatory Visit: Payer: Medicaid Other

## 2014-09-08 DIAGNOSIS — Z510 Encounter for antineoplastic radiation therapy: Secondary | ICD-10-CM | POA: Diagnosis not present

## 2014-09-08 NOTE — Progress Notes (Addendum)
  Oncology Nurse Navigator Documentation   Navigator Encounter Type: Clinic/MDC (09/05/14 1130) Patient Visit Type: HRCBUL (09/05/14 1130) Treatment Phase: First Radiation Tx (09/05/14 1130)     Interventions: Education Method (09/05/14 1130)        To provide support and encouragement, care continuity and to assess for needs, met with patient during [place] s/p appt with Dr. Pablo Ledger during which it was agreed he will begin RT today.  His m-in-law Vivien Rota accompanied him. He was using wheelchair for safety. I reviewed tmt procedure with patient prior to tmt; m-in-law observed tmt, I supported RTT in their explanation. Patient tolerated tmt without incident. I escorted him to Registration for appt with Dr. Alvy Bimler.  [name], RN, BSN, [place] at [place] [phone]       Time Spent with Patient: 45 (09/05/14 1130)

## 2014-09-08 NOTE — Progress Notes (Signed)
59 year old male diagnosed with Supragliottic cancer in June 2016.  He is a patient of Dr. Alvy Bimler.  Past medical history includes CAD status post CABG, hypertension, hyperlipidemia, MI, tobacco, anxiety, and GERD.  Medications include Xanax, Wellbutrin, multivitamin, and Protonix.  Labs include sodium 133, and glucose 100 July 30.  Height: 67 inches. Weight: 157.1 pounds August 23. Usual body weight: 180 pounds. BMI: 24.6.  Patient is status post MI and exploratory lap with colectomy and colostomy. Patient reports he has a good appetite. Oral intake is limited by severe taste alterations with food tasting very salty. Patient able to drink water without difficulty. Patient denies swallowing difficulty but reports pain in throat. Patient reports output from colostomy is appropriate. Patient reports wound is healing well.  Patient meets criteria for severe malnutrition in the context of chronic illness secondary to 13% weight loss in 2 months, less than 75% energy intake for greater than one month and depletion of both body fat and muscle mass.  Nutrition diagnosis:  Unintended weight loss related to inadequate oral intake as evidenced by 13% weight loss.  Intervention:  Provided education on strategies for improving taste and provided a fact sheet on taste alterations. Recommended patient consume 3 meals +3 snacks daily. Recommended patient begin oral nutrition supplements twice a day and provided samples of a variety of flavors for patient to try. Educated patient on ways to improve calories and protein, and stressed importance of weight maintenance. Questions were answered and teach back method used.  Contact information was provided.  Monitoring, evaluation, goals: Patient will tolerate increased calories and protein with oral nutrition supplements twice a day to promote continued healing and maintain lean body mass.  Next visit: Wednesday, August 31.  After physician  appointment.  **Disclaimer: This note was dictated with voice recognition software. Similar sounding words can inadvertently be transcribed and this note may contain transcription errors which may not have been corrected upon publication of note.**

## 2014-09-08 NOTE — Progress Notes (Signed)
  Oncology Nurse Navigator Documentation   Navigator Encounter Type: Clinic/MDC (09/05/14 1200) Patient Visit Type: Medonc (09/05/14 1200)     Joined pt and his m-in-law during est pt appt with Dr. Alvy Bimler. Patient verbalized understanding of adjustment in medications for pain control, including, per Dr. Alvy Bimler, topical application of Emla to L neck area.  [name], RN, BSN, [place] at [place] [phone]                   Time Spent with Patient: 30 (09/05/14 1200)

## 2014-09-11 ENCOUNTER — Ambulatory Visit
Admission: RE | Admit: 2014-09-11 | Discharge: 2014-09-11 | Disposition: A | Discharge status: Home / Self Care | Payer: Medicaid Other | Source: Ambulatory Visit | Attending: Radiation Oncology | Admitting: Radiation Oncology

## 2014-09-11 DIAGNOSIS — Z510 Encounter for antineoplastic radiation therapy: Secondary | ICD-10-CM | POA: Diagnosis not present

## 2014-09-12 ENCOUNTER — Ambulatory Visit
Admission: RE | Admit: 2014-09-12 | Discharge: 2014-09-12 | Disposition: A | Discharge status: Home / Self Care | Payer: Medicaid Other | Source: Ambulatory Visit | Attending: Radiation Oncology | Admitting: Radiation Oncology

## 2014-09-12 VITALS — BP 106/54 | HR 83 | Temp 98.3°F | Wt 158.8 lb

## 2014-09-12 DIAGNOSIS — C329 Malignant neoplasm of larynx, unspecified: Secondary | ICD-10-CM

## 2014-09-12 DIAGNOSIS — Z510 Encounter for antineoplastic radiation therapy: Secondary | ICD-10-CM | POA: Diagnosis not present

## 2014-09-12 NOTE — Progress Notes (Signed)
Wt Readings from Last 3 Encounters:  09/12/14 158 lb 12.8 oz (72.031 kg)  09/05/14 157 lb 1.6 oz (71.26 kg)  09/05/14 157 lb 11.2 oz (71.532 kg)  BP 106/54 mmHg  Pulse 83  Temp(Src) 98.3 F (36.8 C)  Wt 158 lb 12.8 oz (72.031 kg)  SpO2 100%   Completed 6 of 35 fractions to larynx.Mild sore throat.Duragesic patch increased so not requiring morphine for breakthrough pain now.Skin without changes.

## 2014-09-12 NOTE — Progress Notes (Signed)
Weekly Management Note Current Dose: 12 Gy  Projected Dose:70 Gy   Narrative:  The patient presents for routine under treatment assessment.  CBCT/MVCT images/Port film x-rays were reviewed.  The chart was checked. I saw him prior to his first fraction today.  He is doing well. Walking more but still with a walker.  Still has a wound vac and ostomy. Sore throat worse this week. Has duragesic patch and taking morphine liquid but it makes him nauseous.   Physical Findings:  Alert, oriented, hoarse.   Vitals:  Filed Vitals:   09/12/14 1143  BP: 106/54  Pulse: 83  Temp: 98.3 F (36.8 C)   Weight:  Wt Readings from Last 3 Encounters:  09/12/14 158 lb 12.8 oz (72.031 kg)  09/05/14 157 lb 1.6 oz (71.26 kg)  09/05/14 157 lb 11.2 oz (71.532 kg)   Lab Results  Component Value Date   WBC 8.9 08/09/2014   HGB 10.9* 08/09/2014   HCT 33.0* 08/09/2014   MCV 91.9 08/09/2014   PLT 254 08/09/2014   Lab Results  Component Value Date   CREATININE 1.03 08/12/2014   BUN 9 08/12/2014   NA 133* 08/12/2014   K 4.0 08/12/2014   CL 100* 08/12/2014   CO2 25 08/12/2014     Impression/Plan: Continue treatment. Discussed trying half the dose of morphine.

## 2014-09-13 ENCOUNTER — Ambulatory Visit: Payer: Self-pay

## 2014-09-13 ENCOUNTER — Encounter: Payer: Self-pay | Admitting: Hematology and Oncology

## 2014-09-13 ENCOUNTER — Encounter: Payer: Self-pay | Admitting: Nutrition

## 2014-09-13 ENCOUNTER — Telehealth: Payer: Self-pay | Admitting: Hematology and Oncology

## 2014-09-13 ENCOUNTER — Ambulatory Visit: Payer: Self-pay | Admitting: Nutrition

## 2014-09-13 ENCOUNTER — Encounter: Payer: Self-pay | Admitting: *Deleted

## 2014-09-13 ENCOUNTER — Ambulatory Visit (HOSPITAL_BASED_OUTPATIENT_CLINIC_OR_DEPARTMENT_OTHER): Payer: Medicaid Other | Admitting: Hematology and Oncology

## 2014-09-13 ENCOUNTER — Ambulatory Visit
Admission: RE | Admit: 2014-09-13 | Discharge: 2014-09-13 | Disposition: A | Discharge status: Home / Self Care | Payer: Medicaid Other | Source: Ambulatory Visit | Attending: Radiation Oncology | Admitting: Radiation Oncology

## 2014-09-13 VITALS — BP 99/63 | HR 89 | Temp 98.1°F | Resp 18 | Ht 67.0 in | Wt 158.2 lb

## 2014-09-13 DIAGNOSIS — Z510 Encounter for antineoplastic radiation therapy: Secondary | ICD-10-CM | POA: Diagnosis not present

## 2014-09-13 DIAGNOSIS — R070 Pain in throat: Secondary | ICD-10-CM

## 2014-09-13 DIAGNOSIS — C329 Malignant neoplasm of larynx, unspecified: Secondary | ICD-10-CM

## 2014-09-13 DIAGNOSIS — I952 Hypotension due to drugs: Secondary | ICD-10-CM | POA: Diagnosis not present

## 2014-09-13 MED ORDER — MORPHINE SULFATE 15 MG PO TABS: 15.0000 mg | ORAL_TABLET | Freq: Four times a day (QID) | ORAL | Status: DC | PRN

## 2014-09-13 NOTE — Assessment & Plan Note (Signed)
His blood pressure is low related to the use of carvedilol. He is not symptomatic. He denies dehydration. I recommend he contact his cardiologist for medication adjustment.

## 2014-09-13 NOTE — Assessment & Plan Note (Signed)
His pain is better controlled since I increased the dose of the fentanyl patch. Unfortunately, he could not tolerate liquid Roxanol due to the bad taste sensation. I recommend a trial of morphine tablet to take as needed for breakthrough pain. We discussed narcotic refill policy and I warned him against risk of side effects such as constipation and nausea

## 2014-09-13 NOTE — Telephone Encounter (Signed)
per pof to sch pt appt-gave pt copy of avs °

## 2014-09-13 NOTE — Progress Notes (Signed)
[facility] OFFICE PROGRESS NOTE  Patient Care Team: [name], NP as PCP - General [name], RN as Oncology Nurse Navigator [name], MD as Consulting Physician (Radiation Oncology) [name], MD as Consulting Physician (Hematology and Oncology) [name], RD as Dietitian (Nutrition)  SUMMARY OF ONCOLOGIC HISTORY: Oncology History   Squamous cell carcinoma of larynx   Staging form: Larynx - Supraglottis, AJCC 7th Edition     Clinical stage from 07/20/2014: Stage III (T3, N0, M0) - Signed by [name], MD on 08/01/2014       Squamous cell carcinoma of larynx   06/22/2014 Imaging CT scan showed greater than 2 cm supraglottic squamous cell carcinoma of the larynx with contralateral extension. No regional pathologic adenopathy   07/12/2014 Pathology Results Accession: [id number] biopsy of larynx show squamous cell carcinoma   07/12/2014 Surgery Laryngoscopy revealed a large exophytic tumor arising from the left supraglottic larynx, aryepiglottic fold. It extended from the anterior commissure and the epiglottis anteriorly, to the arytenoid posteriorly. The entire aryepiglottic fold was involved   07/27/2014 Imaging Staging PET:  1. Intense metabolic activity associated with the left laryngeal mass.  2. Single mildly hypermetabolic small left level 2 lymph node.  No additional evidence of metastatic adenopathy in neck.  3. No evidence of thoracic metastasis.   07/28/2014 Procedure Port-a-cath placed.   08/02/2014 - 08/04/2014 Hospital Admission Admitted to ICU following allergic rxt to Cetuximab resulting in cardiac arrest   08/02/2014 Adverse Reaction The patient received a small amount of cetuximab resulting in cardiac arrest. The medication was permanently discontinued   08/04/2014 Imaging CT scan of abdomen showed neumoperitoneum. 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     INTERVAL HISTORY: Please see below for problem oriented charting. He is seen today for supportive care visit and pain management. Since I increased the dose of his fentanyl patch last week, his pain is better tolerated. He rated his throat pain as 4 out of 10. He does not like the taste of the liquid Roxanol and was not able to use it as a breakthrough pain medicine causing gagging and nausea. He denies constipation. The abdominal wound is healing and he thinks the wound VAC might be coming out next week  REVIEW OF SYSTEMS:   Constitutional: Denies fevers, chills or abnormal weight loss Eyes: Denies blurriness of vision Respiratory: Denies cough, dyspnea or wheezes Cardiovascular: Denies palpitation, chest discomfort or lower extremity swelling Gastrointestinal:  Denies nausea, heartburn or change in bowel habits Skin: Denies abnormal skin rashes Lymphatics: Denies new lymphadenopathy or easy bruising Neurological:Denies numbness, tingling or new weaknesses Behavioral/Psych: Mood is stable, no new changes  All other systems were reviewed with the patient and are negative.  I have reviewed the past medical history, past surgical history, social history and family history with the patient and they are unchanged from previous note.  ALLERGIES:  is allergic to cetuximab.  MEDICATIONS:  Current Outpatient Prescriptions  Medication Sig Dispense Refill  . albuterol (PROVENTIL HFA;VENTOLIN HFA) 108 (90 BASE) MCG/ACT inhaler Inhale 2 puffs into the lungs every 6 (six) hours as needed for wheezing or shortness of breath. 1 Inhaler 0  .  ALPRAZolam (XANAX) 0.5 MG tablet Take 0.5 mg by mouth 3 (three)  times daily as needed for anxiety.     Marland Kitchen aspirin EC 81 MG tablet Take 81 mg by mouth daily.    Marland Kitchen buPROPion (WELLBUTRIN XL) 300 MG 24 hr tablet Take 300 mg by mouth daily.    . carvedilol (COREG) 12.5 MG tablet Take 0.5 tablets (6.25 mg total) by mouth 2 (two) times daily. 60 tablet 0  . fentaNYL (DURAGESIC - DOSED MCG/HR) 12 MCG/HR Place 1 patch (12.5 mcg total) onto the skin every 3 (three) days. 5 patch 0  . fentaNYL (DURAGESIC - DOSED MCG/HR) 25 MCG/HR patch Place 1 patch (25 mcg total) onto the skin every 3 (three) days. 5 patch 0  . lidocaine-prilocaine (EMLA) cream Apply 1 application topically as directed. Apply to affected area once 30 g 3  . Morphine Sulfate (MORPHINE CONCENTRATE) 10 mg / 0.5 ml concentrated solution Take 1 mL (20 mg total) by mouth every 2 (two) hours as needed for severe pain. 240 mL 0  . Multiple Vitamins-Minerals (MULTIVITAMIN GUMMIES ADULT) CHEW Chew 1 each by mouth 2 (two) times daily.    . pantoprazole (PROTONIX) 40 MG tablet TAKE 1 TABLET BY MOUTH EVERY DAY 90 tablet 3  . potassium chloride SA (K-DUR,KLOR-CON) 20 MEQ tablet TK 1 T PO QD  1  . morphine (MSIR) 15 MG tablet Take 1 tablet (15 mg total) by mouth every 6 (six) hours as needed for severe pain. 60 tablet 0   No current facility-administered medications for this visit.    PHYSICAL EXAMINATION: ECOG PERFORMANCE STATUS: 1 - Symptomatic but completely ambulatory  Filed Vitals:   09/13/14 1204  BP: 99/63  Pulse: 89  Temp: 98.1 F (36.7 C)  Resp: 18   Filed Weights   09/13/14 1204  Weight: 158 lb 3.2 oz (71.759 kg)    GENERAL:alert, no distress and comfortable SKIN: skin color, texture, turgor are normal, no rashes or significant lesions EYES: normal, Conjunctiva are pink and non-injected, sclera clear Musculoskeletal:no cyanosis of digits and no clubbing  NEURO: alert & oriented x 3 with fluent speech, no focal motor/sensory deficits. He has hoarseness  LABORATORY DATA:  I have reviewed the  data as listed    Component Value Date/Time   NA 133* 08/12/2014 0257   NA 137 08/01/2014 1223   K 4.0 08/12/2014 0257   K 4.3 08/01/2014 1223   CL 100* 08/12/2014 0257   CO2 25 08/12/2014 0257   CO2 28 08/01/2014 1223   GLUCOSE 100* 08/12/2014 0257   GLUCOSE 101 08/01/2014 1223   BUN 9 08/12/2014 0257   BUN 14.1 08/01/2014 1223   CREATININE 1.03 08/12/2014 0257   CREATININE 1.1 08/01/2014 1223   CREATININE 1.29 01/11/2014 0907   CALCIUM 8.0* 08/12/2014 0257   CALCIUM 9.3 08/01/2014 1223   PROT 5.8* 08/05/2014 0115   PROT 6.7 08/01/2014 1223   ALBUMIN 3.1* 08/05/2014 0115   ALBUMIN 3.5 08/01/2014 1223   AST 38 08/05/2014 0115   AST 26 08/01/2014 1223   ALT 41 08/05/2014 0115   ALT 25 08/01/2014 1223   ALKPHOS 71 08/05/2014 0115   ALKPHOS 118 08/01/2014 1223   BILITOT 0.4 08/05/2014 0115   BILITOT 0.45 08/01/2014 1223   GFRNONAA >60 08/12/2014 0257   GFRAA >60 08/12/2014 0257    No results found for: SPEP, UPEP  Lab Results  Component Value Date   WBC 8.9 08/09/2014   NEUTROABS 6.4 08/08/2014   HGB  10.9* 08/09/2014   HCT 33.0* 08/09/2014   MCV 91.9 08/09/2014   PLT 254 08/09/2014      Chemistry      Component Value Date/Time   NA 133* 08/12/2014 0257   NA 137 08/01/2014 1223   K 4.0 08/12/2014 0257   K 4.3 08/01/2014 1223   CL 100* 08/12/2014 0257   CO2 25 08/12/2014 0257   CO2 28 08/01/2014 1223   BUN 9 08/12/2014 0257   BUN 14.1 08/01/2014 1223   CREATININE 1.03 08/12/2014 0257   CREATININE 1.1 08/01/2014 1223   CREATININE 1.29 01/11/2014 0907      Component Value Date/Time   CALCIUM 8.0* 08/12/2014 0257   CALCIUM 9.3 08/01/2014 1223   ALKPHOS 71 08/05/2014 0115   ALKPHOS 118 08/01/2014 1223   AST 38 08/05/2014 0115   AST 26 08/01/2014 1223   ALT 41 08/05/2014 0115   ALT 25 08/01/2014 1223   BILITOT 0.4 08/05/2014 0115   BILITOT 0.45 08/01/2014 1223     ASSESSMENT & PLAN:  Squamous cell carcinoma of larynx He is doing well so far with  radiation therapy alone. His pain is better controlled since I increased the dose of the fentanyl patch. Continue supportive care.   Throat pain in adult His pain is better controlled since I increased the dose of the fentanyl patch. Unfortunately, he could not tolerate liquid Roxanol due to the bad taste sensation. I recommend a trial of morphine tablet to take as needed for breakthrough pain. We discussed narcotic refill policy and I warned him against risk of side effects such as constipation and nausea     Hypotension due to drugs His blood pressure is low related to the use of carvedilol. He is not symptomatic. He denies dehydration. I recommend he contact his cardiologist for medication adjustment.   No orders of the defined types were placed in this encounter.   All questions were answered. The patient knows to call the clinic with any problems, questions or concerns. No barriers to learning was detected. I spent 15 minutes counseling the patient face to face. The total time spent in the appointment was 20 minutes and more than 50% was on counseling and review of test results     [facility], Xylah Early, MD 09/13/2014 5:17 PM

## 2014-09-13 NOTE — Assessment & Plan Note (Signed)
He is doing well so far with radiation therapy alone. His pain is better controlled since I increased the dose of the fentanyl patch. Continue supportive care.

## 2014-09-13 NOTE — Progress Notes (Signed)
Nutrition follow-up completed with patient. He reports he feels well. Weight has improved and was documented as 158.2 pounds up from 157.1 pounds August 23. Appetite remains very good. Taste alterations have improved. Colostomy output is appropriate and wound is healing well per patient.  Nutrition diagnosis: Unintended weight loss improved.  Intervention:  Encouraged patient to continue 3 meals +3 snacks daily with oral nutrition supplements 2-3 times daily. Encouraged continued weight gain/maintenance. Teach back method used.  Monitoring, evaluation, goals: Patient will continue increased calories and protein to promote healing and minimize weight loss.  Next visit: Tuesday, September 6 after radiation therapy.  **Disclaimer: This note was dictated with voice recognition software. Similar sounding words can inadvertently be transcribed and this note may contain transcription errors which may not have been corrected upon publication of note.**

## 2014-09-14 ENCOUNTER — Ambulatory Visit
Admission: RE | Admit: 2014-09-14 | Discharge: 2014-09-14 | Disposition: A | Discharge status: Home / Self Care | Payer: Medicaid Other | Source: Ambulatory Visit | Attending: Radiation Oncology | Admitting: Radiation Oncology

## 2014-09-14 DIAGNOSIS — Z510 Encounter for antineoplastic radiation therapy: Secondary | ICD-10-CM | POA: Diagnosis not present

## 2014-09-15 ENCOUNTER — Ambulatory Visit: Payer: Medicaid Other

## 2014-09-15 ENCOUNTER — Ambulatory Visit
Admission: RE | Admit: 2014-09-15 | Discharge: 2014-09-15 | Disposition: A | Discharge status: Home / Self Care | Payer: Medicaid Other | Source: Ambulatory Visit | Attending: Radiation Oncology | Admitting: Radiation Oncology

## 2014-09-15 DIAGNOSIS — Z510 Encounter for antineoplastic radiation therapy: Secondary | ICD-10-CM | POA: Diagnosis not present

## 2014-09-19 ENCOUNTER — Ambulatory Visit: Payer: Self-pay | Admitting: Nutrition

## 2014-09-19 ENCOUNTER — Ambulatory Visit
Admission: RE | Admit: 2014-09-19 | Discharge: 2014-09-19 | Disposition: A | Discharge status: Home / Self Care | Payer: Self-pay | Source: Ambulatory Visit | Attending: Radiation Oncology | Admitting: Radiation Oncology

## 2014-09-19 ENCOUNTER — Ambulatory Visit
Admission: RE | Admit: 2014-09-19 | Discharge: 2014-09-19 | Disposition: A | Discharge status: Home / Self Care | Payer: Medicaid Other | Source: Ambulatory Visit | Attending: Radiation Oncology | Admitting: Radiation Oncology

## 2014-09-19 VITALS — BP 108/75 | HR 75 | Temp 98.6°F | Wt 154.0 lb

## 2014-09-19 DIAGNOSIS — Z510 Encounter for antineoplastic radiation therapy: Secondary | ICD-10-CM | POA: Diagnosis not present

## 2014-09-19 DIAGNOSIS — C329 Malignant neoplasm of larynx, unspecified: Secondary | ICD-10-CM

## 2014-09-19 NOTE — Progress Notes (Signed)
Nutrition follow-up completed with patient who has been diagnosed with supraglottic cancer June 2016.  Patient continues to have radiation therapy. Weight has decreased and was documented as 154 pounds September 6, decreased from 158.2 pounds August 31. Patient has a usual body weight of 180 pounds. Patient complains of increased throat pain and decreased appetite. He is coughing up thick phlegm. Reports colostomy is functioning and wound is healing well.  Patient continues to be hopeful that would VAC will be discontinued next week. Patient having difficulty swallowing food at this time but tolerates oral nutrition supplements. Patient was unclear with total supplements he is drinking currently. Patient adamant that he does not want a feeding tube.  Estimated nutrition needs: 2100-2350 calories, 85-100 grams protein, 2.3 L fluid.  Nutrition diagnosis: Unintended weight loss continues.  Intervention:  Encouraged patient to take pain medications as prescribed and communicate with physician about appropriate dosage of pain meds. Explained importance of adequate nutrition, both in calories and protein for patient to continue with healing. Recommended patient drink one Ensure Plus or boost plus every 2 hours to total 6 bottles daily. Patient can meet greater than 90% of estimated nutrition needs with 6 bottles of Ensure Plus or boost plus daily. Encouraged patient to consume soft foods as tolerated. Expect patient can consume adequate calories and protein using oral nutrition supplements. Teach back method was used.  Monitoring, evaluation, goals: Patient will work to increase oral intake of oral nutrition supplements to minimize further weight loss and meet greater than 90% of estimated nutrition needs.  Next visit: Friday, September 16.  **Disclaimer: This note was dictated with voice recognition software. Similar sounding words can inadvertently be transcribed and this note may contain  transcription errors which may not have been corrected upon publication of note.**

## 2014-09-19 NOTE — Progress Notes (Signed)
Weekly Management Note Current Dose:22 Gy  Projected Dose: 70 Gy   Narrative:  The patient presents for routine under treatment assessment.  CBCT/MVCT images/Port film x-rays were reviewed.  The chart was checked. More throat pain and weight loss. Not taking pain meications. Eating and drinking very little. Meeting with dietician today.   Physical Findings:  Unchanged  Vitals:  Filed Vitals:   09/19/14 1154  BP: 108/75  Pulse: 75  Temp:    Weight:  Wt Readings from Last 3 Encounters:  09/19/14 154 lb (69.854 kg)  09/13/14 158 lb 3.2 oz (71.759 kg)  09/12/14 158 lb 12.8 oz (72.031 kg)   Lab Results  Component Value Date   WBC 8.9 08/09/2014   HGB 10.9* 08/09/2014   HCT 33.0* 08/09/2014   MCV 91.9 08/09/2014   PLT 254 08/09/2014   Lab Results  Component Value Date   CREATININE 1.03 08/12/2014   BUN 9 08/12/2014   NA 133* 08/12/2014   K 4.0 08/12/2014   CL 100* 08/12/2014   CO2 25 08/12/2014     Impression:  The patient is tolerating radiation.  Plan:  Continue treatment as planned. Discussed need for nutrition. Will resim for new treatment plan as mask is loose.

## 2014-09-19 NOTE — Progress Notes (Addendum)
  Oncology Nurse Navigator Documentation   Navigator Encounter Type: Clinic/MDC (09/13/14 1200) Patient Visit Type: Medonc (09/13/14 1200)     To provide support and encouragement, care continuity and to assess for needs, met with patient during est pt with Dr. Alvy Bimler.  He was accompanied by his d-in-law. He stated he has an appt on 9/13 to evaluate would vac removal.  He expressed site is almost completely healed. He denied dizziness despite low BP. He verbalized understanding of Dr. Calton Dach pain mgt guidance, including use of new Rx for MSIR.  He denied navigation needs at this time, he understands I can be contacted.  [name], RN, BSN, [facility] [place] [phone]                    Time Spent with Patient: 15 (09/13/14 1200)

## 2014-09-20 ENCOUNTER — Ambulatory Visit (HOSPITAL_BASED_OUTPATIENT_CLINIC_OR_DEPARTMENT_OTHER): Payer: Medicaid Other | Admitting: Hematology and Oncology

## 2014-09-20 ENCOUNTER — Ambulatory Visit
Admission: RE | Admit: 2014-09-20 | Discharge: 2014-09-20 | Disposition: A | Discharge status: Home / Self Care | Payer: Medicaid Other | Source: Ambulatory Visit | Attending: Radiation Oncology | Admitting: Radiation Oncology

## 2014-09-20 ENCOUNTER — Telehealth: Payer: Self-pay | Admitting: Hematology and Oncology

## 2014-09-20 ENCOUNTER — Telehealth: Payer: Self-pay | Admitting: *Deleted

## 2014-09-20 DIAGNOSIS — Z510 Encounter for antineoplastic radiation therapy: Secondary | ICD-10-CM | POA: Diagnosis not present

## 2014-09-20 DIAGNOSIS — C321 Malignant neoplasm of supraglottis: Secondary | ICD-10-CM | POA: Diagnosis present

## 2014-09-20 DIAGNOSIS — E46 Unspecified protein-calorie malnutrition: Secondary | ICD-10-CM

## 2014-09-20 DIAGNOSIS — C329 Malignant neoplasm of larynx, unspecified: Secondary | ICD-10-CM

## 2014-09-20 DIAGNOSIS — R070 Pain in throat: Secondary | ICD-10-CM | POA: Diagnosis not present

## 2014-09-20 MED ORDER — FENTANYL 50 MCG/HR TD PT72: 50.0000 ug | MEDICATED_PATCH | TRANSDERMAL | Status: DC

## 2014-09-20 NOTE — Telephone Encounter (Signed)
Per staff message and POF I have scheduled appts. Advised scheduler of appts. JMW  

## 2014-09-20 NOTE — Telephone Encounter (Signed)
Appointments made and avs printed for patient,eamil to michelle to help with 9/12 and 9/15

## 2014-09-21 ENCOUNTER — Telehealth: Payer: Self-pay | Admitting: Hematology and Oncology

## 2014-09-21 ENCOUNTER — Ambulatory Visit
Admission: RE | Admit: 2014-09-21 | Discharge: 2014-09-21 | Disposition: A | Discharge status: Home / Self Care | Payer: Medicaid Other | Source: Ambulatory Visit | Attending: Radiation Oncology | Admitting: Radiation Oncology

## 2014-09-21 DIAGNOSIS — Z510 Encounter for antineoplastic radiation therapy: Secondary | ICD-10-CM | POA: Diagnosis not present

## 2014-09-21 DIAGNOSIS — E46 Unspecified protein-calorie malnutrition: Secondary | ICD-10-CM | POA: Insufficient documentation

## 2014-09-21 NOTE — Assessment & Plan Note (Signed)
He is doing well so far with radiation therapy alone. His pain is still poorly controlled Continue supportive care. I will increase his pain medications and continue to follow closely.

## 2014-09-21 NOTE — Assessment & Plan Note (Signed)
He has progressive weight loss due to poor oral intake related to pain. He is hypotensive today. He was seen by dietitian who recommended she drink at least 6 bottles of nutritional supplement per day. I plan to start him on IV fluid next week if he continues to deteriorate

## 2014-09-21 NOTE — Telephone Encounter (Signed)
per michelle chemo room is still capped 9/15 and patient will need to go to sickle cell,inbasket to  Dr Alvy Bimler and cameo     anne

## 2014-09-21 NOTE — Assessment & Plan Note (Signed)
His pain is not controlled Unfortunately, he could not tolerate liquid Roxanol due to the bad taste sensation. I plan to increase fentanyl patch to 50 g and recommend he takes morphine half an hour before he attempts to eat or drink. I suspect he will run out of his medication by the end of the week and I will refill his IR morphine at higher dose then

## 2014-09-21 NOTE — Progress Notes (Signed)
[place] OFFICE PROGRESS NOTE  Patient Care Team: [name], NP as PCP - General [name], RN as Oncology Nurse Navigator [name], MD as Consulting Physician (Radiation Oncology) [name], MD as Consulting Physician (Hematology and Oncology) [name], RD as Dietitian (Nutrition)  SUMMARY OF ONCOLOGIC HISTORY: Oncology History   Squamous cell carcinoma of larynx   Staging form: Larynx - Supraglottis, AJCC 7th Edition     Clinical stage from 07/20/2014: Stage III (T3, N0, M0) - Signed by [name], MD on 08/01/2014       Squamous cell carcinoma of larynx   06/22/2014 Imaging CT scan showed greater than 2 cm supraglottic squamous cell carcinoma of the larynx with contralateral extension. No regional pathologic adenopathy   07/12/2014 Pathology Results Accession: [id number] biopsy of larynx show squamous cell carcinoma   07/12/2014 Surgery Laryngoscopy revealed a large exophytic tumor arising from the left supraglottic larynx, aryepiglottic fold. It extended from the anterior commissure and the epiglottis anteriorly, to the arytenoid posteriorly. The entire aryepiglottic fold was involved   07/27/2014 Imaging Staging PET:  1. Intense metabolic activity associated with the left laryngeal mass.  2. Single mildly hypermetabolic small left level 2 lymph node.  No additional evidence of metastatic adenopathy in neck.  3. No evidence of thoracic metastasis.   07/28/2014 Procedure Port-a-cath placed.   08/02/2014 - 08/04/2014 Hospital Admission Admitted to ICU following allergic rxt to Cetuximab resulting in cardiac arrest   08/02/2014 Adverse Reaction The patient received a small amount of cetuximab resulting in cardiac arrest. The medication was permanently discontinued   08/04/2014 Imaging CT scan of abdomen showed neumoperitoneum. 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     INTERVAL HISTORY: Please see below for problem oriented charting. He is seen as part of his weekly supportive care visit. He continues to have severe throat pain whenever he attempts to eat or swallow. He is not able to drink more than 6 bottles of water per day. He has lost 3 pounds since I saw him. He is abdominal wound continues to heal.  REVIEW OF SYSTEMS:   Constitutional: Denies fevers, chills or abnormal weight loss Eyes: Denies blurriness of vision Respiratory: Denies cough, dyspnea or wheezes Cardiovascular: Denies palpitation, chest discomfort or lower extremity swelling Gastrointestinal:  Denies nausea, heartburn or change in bowel habits Skin: Denies abnormal skin rashes Lymphatics: Denies new lymphadenopathy or easy bruising Neurological:Denies numbness, tingling or new weaknesses Behavioral/Psych: Mood is stable, no new changes  All other systems were reviewed with the patient and are negative.  I have reviewed the past medical history, past surgical history, social history and family history with the patient and they are unchanged from previous note.  ALLERGIES:  is allergic to cetuximab.  MEDICATIONS:  Current Outpatient Prescriptions  Medication Sig Dispense Refill  . albuterol (PROVENTIL HFA;VENTOLIN HFA) 108 (90 BASE) MCG/ACT inhaler Inhale 2 puffs into the lungs every 6 (six) hours as needed for wheezing or shortness of breath. 1 Inhaler 0  . ALPRAZolam (XANAX) 0.5 MG tablet Take 0.5 mg by mouth 3 (three) times daily as needed for anxiety.     Marland Kitchen aspirin EC 81 MG  tablet Take 81 mg by mouth daily.    Marland Kitchen buPROPion (WELLBUTRIN XL) 300 MG 24 hr tablet Take 300 mg by mouth  daily.    . carvedilol (COREG) 12.5 MG tablet Take 0.5 tablets (6.25 mg total) by mouth 2 (two) times daily. 60 tablet 0  . fentaNYL (DURAGESIC - DOSED MCG/HR) 12 MCG/HR Place 1 patch (12.5 mcg total) onto the skin every 3 (three) days. 5 patch 0  . fentaNYL (DURAGESIC - DOSED MCG/HR) 50 MCG/HR Place 1 patch (50 mcg total) onto the skin every 3 (three) days. 5 patch 0  . lidocaine-prilocaine (EMLA) cream Apply 1 application topically as directed. Apply to affected area once 30 g 3  . morphine (MSIR) 15 MG tablet Take 1 tablet (15 mg total) by mouth every 6 (six) hours as needed for severe pain. 60 tablet 0  . Morphine Sulfate (MORPHINE CONCENTRATE) 10 mg / 0.5 ml concentrated solution Take 1 mL (20 mg total) by mouth every 2 (two) hours as needed for severe pain. 240 mL 0  . Multiple Vitamins-Minerals (MULTIVITAMIN GUMMIES ADULT) CHEW Chew 1 each by mouth 2 (two) times daily.    . pantoprazole (PROTONIX) 40 MG tablet TAKE 1 TABLET BY MOUTH EVERY DAY 90 tablet 3  . potassium chloride SA (K-DUR,KLOR-CON) 20 MEQ tablet TK 1 T PO QD  1   No current facility-administered medications for this visit.    PHYSICAL EXAMINATION: ECOG PERFORMANCE STATUS: 1 - Symptomatic but completely ambulatory GENERAL:alert, no distress and comfortable SKIN: skin color, texture, turgor are normal, no rashes or significant lesions. Noted mild radiation-induced dermatitis EYES: normal, Conjunctiva are pink and non-injected, sclera clear OROPHARYNX:no exudate, no erythema and lips, buccal mucosa, and tongue normal . No mucositis or thrush. Musculoskeletal:no cyanosis of digits and no clubbing  NEURO: alert & oriented x 3 with fluent speech, no focal motor/sensory deficits  LABORATORY DATA:  I have reviewed the data as listed    Component Value Date/Time   NA 133* 08/12/2014 0257   NA 137 08/01/2014 1223   K 4.0 08/12/2014 0257   K 4.3 08/01/2014 1223   CL 100* 08/12/2014 0257   CO2 25 08/12/2014 0257   CO2 28  08/01/2014 1223   GLUCOSE 100* 08/12/2014 0257   GLUCOSE 101 08/01/2014 1223   BUN 9 08/12/2014 0257   BUN 14.1 08/01/2014 1223   CREATININE 1.03 08/12/2014 0257   CREATININE 1.1 08/01/2014 1223   CREATININE 1.29 01/11/2014 0907   CALCIUM 8.0* 08/12/2014 0257   CALCIUM 9.3 08/01/2014 1223   PROT 5.8* 08/05/2014 0115   PROT 6.7 08/01/2014 1223   ALBUMIN 3.1* 08/05/2014 0115   ALBUMIN 3.5 08/01/2014 1223   AST 38 08/05/2014 0115   AST 26 08/01/2014 1223   ALT 41 08/05/2014 0115   ALT 25 08/01/2014 1223   ALKPHOS 71 08/05/2014 0115   ALKPHOS 118 08/01/2014 1223   BILITOT 0.4 08/05/2014 0115   BILITOT 0.45 08/01/2014 1223   GFRNONAA >60 08/12/2014 0257   GFRAA >60 08/12/2014 0257    No results found for: SPEP, UPEP  Lab Results  Component Value Date   WBC 8.9 08/09/2014   NEUTROABS 6.4 08/08/2014   HGB 10.9* 08/09/2014   HCT 33.0* 08/09/2014   MCV 91.9 08/09/2014   PLT 254 08/09/2014      Chemistry      Component Value Date/Time   NA 133* 08/12/2014 0257   NA 137 08/01/2014 1223   K 4.0 08/12/2014 0257   K  4.3 08/01/2014 1223   CL 100* 08/12/2014 0257   CO2 25 08/12/2014 0257   CO2 28 08/01/2014 1223   BUN 9 08/12/2014 0257   BUN 14.1 08/01/2014 1223   CREATININE 1.03 08/12/2014 0257   CREATININE 1.1 08/01/2014 1223   CREATININE 1.29 01/11/2014 0907      Component Value Date/Time   CALCIUM 8.0* 08/12/2014 0257   CALCIUM 9.3 08/01/2014 1223   ALKPHOS 71 08/05/2014 0115   ALKPHOS 118 08/01/2014 1223   AST 38 08/05/2014 0115   AST 26 08/01/2014 1223   ALT 41 08/05/2014 0115   ALT 25 08/01/2014 1223   BILITOT 0.4 08/05/2014 0115   BILITOT 0.45 08/01/2014 1223      ASSESSMENT & PLAN:  Squamous cell carcinoma of larynx He is doing well so far with radiation therapy alone. His pain is still poorly controlled Continue supportive care. I will increase his pain medications and continue to follow closely.     Throat pain in adult His pain is not  controlled Unfortunately, he could not tolerate liquid Roxanol due to the bad taste sensation. I plan to increase fentanyl patch to 50 g and recommend he takes morphine half an hour before he attempts to eat or drink. I suspect he will run out of his medication by the end of the week and I will refill his IR morphine at higher dose then   Protein calorie malnutrition He has progressive weight loss due to poor oral intake related to pain. He is hypotensive today. He was seen by dietitian who recommended she drink at least 6 bottles of nutritional supplement per day. I plan to start him on IV fluid next week if he continues to deteriorate   No orders of the defined types were placed in this encounter.   All questions were answered. The patient knows to call the clinic with any problems, questions or concerns. No barriers to learning was detected. I spent 25 minutes counseling the patient face to face. The total time spent in the appointment was 30 minutes and more than 50% was on counseling and review of test results     [facility], [place], MD 09/21/2014 8:31 AM

## 2014-09-22 ENCOUNTER — Other Ambulatory Visit: Payer: Self-pay | Admitting: Hematology and Oncology

## 2014-09-22 ENCOUNTER — Telehealth: Payer: Self-pay | Admitting: *Deleted

## 2014-09-22 ENCOUNTER — Ambulatory Visit
Admission: RE | Admit: 2014-09-22 | Discharge: 2014-09-22 | Disposition: A | Discharge status: Home / Self Care | Payer: Medicaid Other | Source: Ambulatory Visit | Attending: Radiation Oncology | Admitting: Radiation Oncology

## 2014-09-22 DIAGNOSIS — C329 Malignant neoplasm of larynx, unspecified: Secondary | ICD-10-CM

## 2014-09-22 DIAGNOSIS — Z510 Encounter for antineoplastic radiation therapy: Secondary | ICD-10-CM | POA: Diagnosis not present

## 2014-09-22 MED ORDER — MORPHINE SULFATE 30 MG PO TABS: 30.0000 mg | ORAL_TABLET | Freq: Four times a day (QID) | ORAL | Status: DC | PRN

## 2014-09-22 NOTE — Telephone Encounter (Signed)
Ready for pick up Per discussion, I am increasing the dose

## 2014-09-22 NOTE — Telephone Encounter (Signed)
TC from pt's daughter requesting refill on pt's Morphine 15 mg 1 every 6 hours. Last filled on 09/13/14 for 60 tablets.  She stated that pt will be here for XRT at 2:40 pm and can pick up prescription then.

## 2014-09-25 ENCOUNTER — Ambulatory Visit (HOSPITAL_BASED_OUTPATIENT_CLINIC_OR_DEPARTMENT_OTHER): Payer: Medicaid Other | Admitting: Hematology and Oncology

## 2014-09-25 ENCOUNTER — Ambulatory Visit: Payer: Medicaid Other

## 2014-09-25 ENCOUNTER — Ambulatory Visit: Payer: Self-pay

## 2014-09-25 ENCOUNTER — Ambulatory Visit
Admission: RE | Admit: 2014-09-25 | Discharge: 2014-09-25 | Disposition: A | Discharge status: Home / Self Care | Payer: Medicaid Other | Source: Ambulatory Visit | Attending: Radiation Oncology | Admitting: Radiation Oncology

## 2014-09-25 ENCOUNTER — Encounter: Payer: Self-pay | Admitting: Hematology and Oncology

## 2014-09-25 ENCOUNTER — Other Ambulatory Visit (HOSPITAL_BASED_OUTPATIENT_CLINIC_OR_DEPARTMENT_OTHER): Payer: Medicaid Other

## 2014-09-25 ENCOUNTER — Telehealth: Payer: Self-pay | Admitting: Hematology and Oncology

## 2014-09-25 VITALS — BP 130/80 | HR 94 | Temp 97.8°F | Resp 18 | Ht 67.0 in | Wt 156.2 lb

## 2014-09-25 DIAGNOSIS — C321 Malignant neoplasm of supraglottis: Secondary | ICD-10-CM

## 2014-09-25 DIAGNOSIS — C329 Malignant neoplasm of larynx, unspecified: Secondary | ICD-10-CM

## 2014-09-25 DIAGNOSIS — E46 Unspecified protein-calorie malnutrition: Secondary | ICD-10-CM | POA: Diagnosis not present

## 2014-09-25 DIAGNOSIS — R070 Pain in throat: Secondary | ICD-10-CM

## 2014-09-25 DIAGNOSIS — D638 Anemia in other chronic diseases classified elsewhere: Secondary | ICD-10-CM

## 2014-09-25 DIAGNOSIS — Z510 Encounter for antineoplastic radiation therapy: Secondary | ICD-10-CM | POA: Diagnosis not present

## 2014-09-25 LAB — CBC WITH DIFFERENTIAL/PLATELET
BASO%: 0.6 % (ref 0.0–2.0)
BASOS ABS: 0 10*3/uL (ref 0.0–0.1)
EOS ABS: 0.3 10*3/uL (ref 0.0–0.5)
EOS%: 5.1 % (ref 0.0–7.0)
HCT: 35 % — ABNORMAL LOW (ref 38.4–49.9)
HEMOGLOBIN: 11.4 g/dL — AB (ref 13.0–17.1)
LYMPH%: 24.9 % (ref 14.0–49.0)
MCH: 29.9 pg (ref 27.2–33.4)
MCHC: 32.5 g/dL (ref 32.0–36.0)
MCV: 91.8 fL (ref 79.3–98.0)
MONO#: 0.8 10*3/uL (ref 0.1–0.9)
MONO%: 13.5 % (ref 0.0–14.0)
NEUT#: 3.4 10*3/uL (ref 1.5–6.5)
NEUT%: 55.9 % (ref 39.0–75.0)
Platelets: 371 10*3/uL (ref 140–400)
RBC: 3.81 10*6/uL — ABNORMAL LOW (ref 4.20–5.82)
RDW: 15.2 % — ABNORMAL HIGH (ref 11.0–14.6)
WBC: 6 10*3/uL (ref 4.0–10.3)
lymph#: 1.5 10*3/uL (ref 0.9–3.3)

## 2014-09-25 LAB — COMPREHENSIVE METABOLIC PANEL (CC13)
ALT: 25 U/L (ref 0–55)
AST: 34 U/L (ref 5–34)
Albumin: 3.3 g/dL — ABNORMAL LOW (ref 3.5–5.0)
Alkaline Phosphatase: 140 U/L (ref 40–150)
Anion Gap: 5 mEq/L (ref 3–11)
BUN: 7.9 mg/dL (ref 7.0–26.0)
CHLORIDE: 104 meq/L (ref 98–109)
CO2: 30 meq/L — AB (ref 22–29)
CREATININE: 0.9 mg/dL (ref 0.7–1.3)
Calcium: 9.7 mg/dL (ref 8.4–10.4)
EGFR: >90 mL/min/{1.73_m2} (ref >90)
Glucose: 98 mg/dl (ref 70–140)
Potassium: 4.7 mEq/L (ref 3.5–5.1)
Sodium: 139 mEq/L (ref 136–145)
Total Bilirubin: 0.34 mg/dL (ref 0.20–1.20)
Total Protein: 6.9 g/dL (ref 6.4–8.3)

## 2014-09-25 NOTE — Assessment & Plan Note (Signed)
Over the weekend, with the help of pain medicine, he is able to drink more water and tolerate 4 Ensure per day. He has gained some weight. I recommend he continued the same.

## 2014-09-25 NOTE — Telephone Encounter (Signed)
per pof to sch pt appt-gave pt copy of avs °

## 2014-09-25 NOTE — Assessment & Plan Note (Signed)
This is likely anemia of chronic disease. The patient denies recent history of bleeding such as epistaxis, hematuria or hematochezia. He is asymptomatic from the anemia. We will observe for now.  

## 2014-09-25 NOTE — Assessment & Plan Note (Addendum)
He is doing well so far with radiation therapy alone. His pain is better controlled Initially, due to hypotension and weight loss last week, I recommended daily IV fluid support. Now the patient is more compliant and his blood pressure has improved, I will cancel IV fluid and reassess next week. Continue supportive care.

## 2014-09-25 NOTE — Progress Notes (Signed)
[place] OFFICE PROGRESS NOTE  Patient Care Team: [name], NP as PCP - General [name], RN as Oncology Nurse Navigator [name], MD as Consulting Physician (Radiation Oncology) [name], MD as Consulting Physician (Hematology and Oncology) [name], RD as Dietitian (Nutrition)  SUMMARY OF ONCOLOGIC HISTORY: Oncology History   Squamous cell carcinoma of larynx   Staging form: Larynx - Supraglottis, AJCC 7th Edition     Clinical stage from 07/20/2014: Stage III (T3, N0, M0) - Signed by [name], MD on 08/01/2014       Squamous cell carcinoma of larynx   06/22/2014 Imaging CT scan showed greater than 2 cm supraglottic squamous cell carcinoma of the larynx with contralateral extension. No regional pathologic adenopathy   07/12/2014 Pathology Results Accession: [id number] biopsy of larynx show squamous cell carcinoma   07/12/2014 Surgery Laryngoscopy revealed a large exophytic tumor arising from the left supraglottic larynx, aryepiglottic fold. It extended from the anterior commissure and the epiglottis anteriorly, to the arytenoid posteriorly. The entire aryepiglottic fold was involved   07/27/2014 Imaging Staging PET:  1. Intense metabolic activity associated with the left laryngeal mass.  2. Single mildly hypermetabolic small left level 2 lymph node.  No additional evidence of metastatic adenopathy in neck.  3. No evidence of thoracic metastasis.   07/28/2014 Procedure Port-a-cath placed.   08/02/2014 - 08/04/2014 Hospital Admission Admitted to ICU following allergic rxt to Cetuximab resulting in cardiac arrest   08/02/2014 Adverse Reaction The patient received a small amount of cetuximab resulting in cardiac arrest. The medication was permanently discontinued   08/04/2014 Imaging CT scan of abdomen showed neumoperitoneum. 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     INTERVAL HISTORY: Please see below for problem oriented charting. He is seen today for supportive care. His pain is better control and he is able to drink and has gained some weight. He denies swallowing difficulties.  REVIEW OF SYSTEMS:   Constitutional: Denies fevers, chills or abnormal weight loss Eyes: Denies blurriness of vision Ears, nose, mouth, throat, and face: Denies mucositis  Respiratory: Denies cough, dyspnea or wheezes Cardiovascular: Denies palpitation, chest discomfort or lower extremity swelling Gastrointestinal:  Denies nausea, heartburn or change in bowel habits Skin: Denies abnormal skin rashes Lymphatics: Denies new lymphadenopathy or easy bruising Neurological:Denies numbness, tingling or new weaknesses Behavioral/Psych: Mood is stable, no new changes  All other systems were reviewed with the patient and are negative.  I have reviewed the past medical history, past surgical history, social history and family history with the patient and they are unchanged from previous note.  ALLERGIES:  is allergic to cetuximab.  MEDICATIONS:  Current Outpatient Prescriptions  Medication Sig Dispense Refill  . albuterol (PROVENTIL HFA;VENTOLIN HFA) 108 (90 BASE) MCG/ACT inhaler Inhale 2 puffs into the lungs every 6 (six) hours as needed for wheezing or shortness of breath. 1 Inhaler 0  . ALPRAZolam (XANAX) 0.5 MG tablet Take 0.5 mg by mouth 3 (three) times daily as needed for anxiety.     Marland Kitchen aspirin EC 81 MG tablet Take 81 mg by mouth daily.    Marland Kitchen buPROPion (WELLBUTRIN XL) 300 MG 24 hr tablet  Take 300 mg by mouth daily.    . carvedilol (COREG) 12.5 MG tablet Take 0.5 tablets (6.25 mg  total) by mouth 2 (two) times daily. 60 tablet 0  . fentaNYL (DURAGESIC - DOSED MCG/HR) 50 MCG/HR Place 1 patch (50 mcg total) onto the skin every 3 (three) days. 5 patch 0  . lidocaine-prilocaine (EMLA) cream Apply 1 application topically as directed. Apply to affected area once 30 g 3  . morphine (MSIR) 30 MG tablet Take 1 tablet (30 mg total) by mouth every 6 (six) hours as needed for severe pain. 60 tablet 0  . Morphine Sulfate (MORPHINE CONCENTRATE) 10 mg / 0.5 ml concentrated solution Take 1 mL (20 mg total) by mouth every 2 (two) hours as needed for severe pain. 240 mL 0  . Multiple Vitamins-Minerals (MULTIVITAMIN GUMMIES ADULT) CHEW Chew 1 each by mouth 2 (two) times daily.    . pantoprazole (PROTONIX) 40 MG tablet TAKE 1 TABLET BY MOUTH EVERY DAY 90 tablet 3  . potassium chloride SA (K-DUR,KLOR-CON) 20 MEQ tablet TK 1 T PO QD  1   No current facility-administered medications for this visit.    PHYSICAL EXAMINATION: ECOG PERFORMANCE STATUS: 1 - Symptomatic but completely ambulatory  Filed Vitals:   09/25/14 1003  BP: 130/80  Pulse: 94  Temp: 97.8 F (36.6 C)  Resp: 18   Filed Weights   09/25/14 1003  Weight: 156 lb 3.2 oz (70.852 kg)    GENERAL:alert, no distress and comfortable SKIN: skin color, texture, turgor are normal, no rashes or significant lesions. He had faint rash around his neck from radiation EYES: normal, Conjunctiva are pink and non-injected, sclera clear OROPHARYNX:no exudate, no erythema and lips, buccal mucosa, and tongue normal  NECK: supple, thyroid normal size, non-tender, without nodularity LYMPH:  no palpable lymphadenopathy in the cervical, axillary or inguinal LUNGS: clear to auscultation and percussion with normal breathing effort HEART: regular rate & rhythm and no murmurs and no lower extremity edema ABDOMEN:abdomen soft, non-tender and normal bowel sounds. His abdominal wound is healing well Musculoskeletal:no cyanosis of digits and no  clubbing  NEURO: alert & oriented x 3 with fluent speech with some hoarseness of voice, no focal motor/sensory deficits  LABORATORY DATA:  I have reviewed the data as listed    Component Value Date/Time   NA 139 09/25/2014 0948   NA 133* 08/12/2014 0257   K 4.7 09/25/2014 0948   K 4.0 08/12/2014 0257   CL 100* 08/12/2014 0257   CO2 30* 09/25/2014 0948   CO2 25 08/12/2014 0257   GLUCOSE 98 09/25/2014 0948   GLUCOSE 100* 08/12/2014 0257   BUN 7.9 09/25/2014 0948   BUN 9 08/12/2014 0257   CREATININE 0.9 09/25/2014 0948   CREATININE 1.03 08/12/2014 0257   CREATININE 1.29 01/11/2014 0907   CALCIUM 9.7 09/25/2014 0948   CALCIUM 8.0* 08/12/2014 0257   PROT 6.9 09/25/2014 0948   PROT 5.8* 08/05/2014 0115   ALBUMIN 3.3* 09/25/2014 0948   ALBUMIN 3.1* 08/05/2014 0115   AST 34 09/25/2014 0948   AST 38 08/05/2014 0115   ALT 25 09/25/2014 0948   ALT 41 08/05/2014 0115   ALKPHOS 140 09/25/2014 0948   ALKPHOS 71 08/05/2014 0115   BILITOT 0.34 09/25/2014 0948   BILITOT 0.4 08/05/2014 0115   GFRNONAA >60 08/12/2014 0257   GFRAA >60 08/12/2014 0257    No results found for: SPEP, UPEP  Lab Results  Component Value Date   WBC 6.0 09/25/2014  NEUTROABS 3.4 09/25/2014   HGB 11.4* 09/25/2014   HCT 35.0* 09/25/2014   MCV 91.8 09/25/2014   PLT 371 09/25/2014      Chemistry      Component Value Date/Time   NA 139 09/25/2014 0948   NA 133* 08/12/2014 0257   K 4.7 09/25/2014 0948   K 4.0 08/12/2014 0257   CL 100* 08/12/2014 0257   CO2 30* 09/25/2014 0948   CO2 25 08/12/2014 0257   BUN 7.9 09/25/2014 0948   BUN 9 08/12/2014 0257   CREATININE 0.9 09/25/2014 0948   CREATININE 1.03 08/12/2014 0257   CREATININE 1.29 01/11/2014 0907      Component Value Date/Time   CALCIUM 9.7 09/25/2014 0948   CALCIUM 8.0* 08/12/2014 0257   ALKPHOS 140 09/25/2014 0948   ALKPHOS 71 08/05/2014 0115   AST 34 09/25/2014 0948   AST 38 08/05/2014 0115   ALT 25 09/25/2014 0948   ALT 41 08/05/2014  0115   BILITOT 0.34 09/25/2014 0948   BILITOT 0.4 08/05/2014 0115      ASSESSMENT & PLAN:  Squamous cell carcinoma of larynx He is doing well so far with radiation therapy alone. His pain is better controlled Initially, due to hypotension and weight loss last week, I recommended daily IV fluid support. Now the patient is more compliant and his blood pressure has improved, I will cancel IV fluid and reassess next week. Continue supportive care.    Throat pain in adult With recent pain medicine adjustment, he is getting better. He will continue fentanyl patch at 50 g and he takes morphine half an hour before he attempts to eat or drink.  Anemia in chronic illness This is likely anemia of chronic disease. The patient denies recent history of bleeding such as epistaxis, hematuria or hematochezia. He is asymptomatic from the anemia. We will observe for now.   Protein calorie malnutrition Over the weekend, with the help of pain medicine, he is able to drink more water and tolerate 4 Ensure per day. He has gained some weight. I recommend he continued the same.   No orders of the defined types were placed in this encounter.   All questions were answered. The patient knows to call the clinic with any problems, questions or concerns. No barriers to learning was detected. I spent 15 minutes counseling the patient face to face. The total time spent in the appointment was 20 minutes and more than 50% was on counseling and review of test results     [facility], [place], MD 09/25/2014 3:26 PM

## 2014-09-25 NOTE — Assessment & Plan Note (Signed)
With recent pain medicine adjustment, he is getting better. He will continue fentanyl patch at 50 g and he takes morphine half an hour before he attempts to eat or drink.

## 2014-09-26 ENCOUNTER — Ambulatory Visit
Admission: RE | Admit: 2014-09-26 | Discharge: 2014-09-26 | Disposition: A | Discharge status: Home / Self Care | Payer: Medicaid Other | Source: Ambulatory Visit | Attending: Radiation Oncology | Admitting: Radiation Oncology

## 2014-09-26 ENCOUNTER — Ambulatory Visit: Payer: Medicaid Other

## 2014-09-26 ENCOUNTER — Ambulatory Visit: Payer: Self-pay

## 2014-09-26 DIAGNOSIS — Z510 Encounter for antineoplastic radiation therapy: Secondary | ICD-10-CM | POA: Diagnosis not present

## 2014-09-27 ENCOUNTER — Ambulatory Visit
Admission: RE | Admit: 2014-09-27 | Discharge: 2014-09-27 | Disposition: A | Discharge status: Home / Self Care | Payer: Medicaid Other | Source: Ambulatory Visit | Attending: Radiation Oncology | Admitting: Radiation Oncology

## 2014-09-27 ENCOUNTER — Ambulatory Visit: Payer: Self-pay

## 2014-09-27 DIAGNOSIS — Z510 Encounter for antineoplastic radiation therapy: Secondary | ICD-10-CM | POA: Diagnosis not present

## 2014-09-28 ENCOUNTER — Ambulatory Visit
Admission: RE | Admit: 2014-09-28 | Discharge: 2014-09-28 | Disposition: A | Discharge status: Home / Self Care | Payer: Medicaid Other | Source: Ambulatory Visit | Attending: Radiation Oncology | Admitting: Radiation Oncology

## 2014-09-28 ENCOUNTER — Encounter (HOSPITAL_COMMUNITY): Payer: Self-pay

## 2014-09-28 VITALS — BP 109/70 | HR 93 | Temp 98.2°F | Wt 155.1 lb

## 2014-09-28 DIAGNOSIS — C329 Malignant neoplasm of larynx, unspecified: Secondary | ICD-10-CM

## 2014-09-28 DIAGNOSIS — Z510 Encounter for antineoplastic radiation therapy: Secondary | ICD-10-CM | POA: Diagnosis not present

## 2014-09-28 NOTE — Progress Notes (Signed)
Weekly Management Note Current Dose:38 Gy  Projected Dose:70 Gy   Narrative:  The patient presents for routine under treatment assessment.  CBCT/MVCT images/Port film x-rays were reviewed.  The chart was checked. Doing well. Using Duragesic 50 mcg patch with MSIR for breakthrough. Vac removed and abdominal wound healing well. "Forcing" himself to eat.   Physical Findings:  Red skin over neck.   Vitals:  Filed Vitals:   09/28/14 1206  BP: 109/70  Pulse: 93  Temp:    Weight:  Wt Readings from Last 3 Encounters:  09/28/14 155 lb 1.6 oz (70.353 kg)  09/26/14 156 lb (70.761 kg)  09/25/14 156 lb 3.2 oz (70.852 kg)   Lab Results  Component Value Date   WBC 6.0 09/25/2014   HGB 11.4* 09/25/2014   HCT 35.0* 09/25/2014   MCV 91.8 09/25/2014   PLT 371 09/25/2014   Lab Results  Component Value Date   CREATININE 0.9 09/25/2014   BUN 7.9 09/25/2014   NA 139 09/25/2014   K 4.7 09/25/2014   CL 100* 08/12/2014   CO2 30* 09/25/2014     Impression:  The patient is tolerating radiation.  Plan:  Continue treatment as planned. Continue pain medications. Continue biafene.

## 2014-09-28 NOTE — Progress Notes (Signed)
BP 120/60 mmHg  Pulse 89  Temp(Src) 98.2 F (36.8 C)  Wt 155 lb 1.6 oz (70.353 kg)  SpO2 100%  Standing BP 109/70 P 93. Wt Readings from Last 3 Encounters:  09/28/14 155 lb 1.6 oz (70.353 kg)  09/26/14 156 lb (70.761 kg)  09/25/14 156 lb 3.2 oz (70.852 kg)   Weekly assessment of radiation to larynx.Completed 19 of 35 treatments.Throat pain "6".Skin is red without peeling.continue application of biafine.

## 2014-09-29 ENCOUNTER — Ambulatory Visit
Admission: RE | Admit: 2014-09-29 | Discharge: 2014-09-29 | Disposition: A | Discharge status: Home / Self Care | Payer: Medicaid Other | Source: Ambulatory Visit | Attending: Radiation Oncology | Admitting: Radiation Oncology

## 2014-09-29 ENCOUNTER — Ambulatory Visit: Payer: Self-pay

## 2014-09-29 ENCOUNTER — Encounter: Payer: Self-pay | Admitting: Nutrition

## 2014-09-29 ENCOUNTER — Encounter: Payer: Self-pay | Admitting: *Deleted

## 2014-09-29 ENCOUNTER — Ambulatory Visit: Payer: Medicaid Other

## 2014-09-29 DIAGNOSIS — Z510 Encounter for antineoplastic radiation therapy: Secondary | ICD-10-CM | POA: Diagnosis not present

## 2014-09-29 NOTE — Progress Notes (Signed)
  Oncology Nurse Navigator Documentation   Navigator Encounter Type: Treatment (09/29/14 0800) Patient Visit Type: Radonc (09/29/14 0800) Treatment Phase: Treatment (09/29/14 0800)     To provide support and encouragement, care continuity and to assess for needs, met with patient prior to [facility] tmt.  He was accompanied by his d-in-law, Vivien Rota; using a walker. He reported:  Increased hoarseness.  Denied swallowing difficulty.  Eating a variety of foods, drinking 3-4 nutritional shakes daily, drinking "gallons" of water.  Has gained 2 lbs.  Pain managed pretty well with current 50 mcg Fentanyl patch, takes PRN morphine tablets about 3 times daily, uses MMW before meals.  Applying Biafine couple times daily, indicated understanding not to apply 4 hrs prior to tmt. He did not express any needs or concerns at this time, I encouraged him to contact me if that changes, he verbalized understanding.  [name], RN, BSN, [place] [facility] [place] [phone]                  Time Spent with Patient: 30 (09/29/14 0800)

## 2014-10-02 ENCOUNTER — Ambulatory Visit
Admission: RE | Admit: 2014-10-02 | Discharge: 2014-10-02 | Disposition: A | Discharge status: Home / Self Care | Payer: Medicaid Other | Source: Ambulatory Visit | Attending: Radiation Oncology | Admitting: Radiation Oncology

## 2014-10-02 DIAGNOSIS — Z510 Encounter for antineoplastic radiation therapy: Secondary | ICD-10-CM | POA: Diagnosis not present

## 2014-10-03 ENCOUNTER — Ambulatory Visit (HOSPITAL_BASED_OUTPATIENT_CLINIC_OR_DEPARTMENT_OTHER): Payer: Medicaid Other | Admitting: Hematology and Oncology

## 2014-10-03 ENCOUNTER — Ambulatory Visit
Admission: RE | Admit: 2014-10-03 | Discharge: 2014-10-03 | Disposition: A | Discharge status: Home / Self Care | Payer: Self-pay | Source: Ambulatory Visit | Attending: Radiation Oncology | Admitting: Radiation Oncology

## 2014-10-03 ENCOUNTER — Telehealth: Payer: Self-pay | Admitting: Hematology and Oncology

## 2014-10-03 ENCOUNTER — Encounter: Payer: Self-pay | Admitting: Hematology and Oncology

## 2014-10-03 ENCOUNTER — Ambulatory Visit
Admission: RE | Admit: 2014-10-03 | Discharge: 2014-10-03 | Disposition: A | Discharge status: Home / Self Care | Payer: Medicaid Other | Source: Ambulatory Visit | Attending: Radiation Oncology | Admitting: Radiation Oncology

## 2014-10-03 VITALS — BP 117/74 | HR 97 | Temp 98.2°F | Resp 18 | Ht 67.0 in | Wt 152.1 lb

## 2014-10-03 VITALS — BP 117/74 | HR 97 | Temp 98.2°F | Wt 152.2 lb

## 2014-10-03 DIAGNOSIS — C329 Malignant neoplasm of larynx, unspecified: Secondary | ICD-10-CM

## 2014-10-03 DIAGNOSIS — Z510 Encounter for antineoplastic radiation therapy: Secondary | ICD-10-CM | POA: Diagnosis not present

## 2014-10-03 DIAGNOSIS — E46 Unspecified protein-calorie malnutrition: Secondary | ICD-10-CM

## 2014-10-03 DIAGNOSIS — R070 Pain in throat: Secondary | ICD-10-CM | POA: Diagnosis not present

## 2014-10-03 MED ORDER — BIAFINE EX EMUL
CUTANEOUS | Status: DC | PRN
  Administered 2014-10-03: 12:00:00 via TOPICAL

## 2014-10-03 MED ORDER — FENTANYL 100 MCG/HR TD PT72
100.0000 ug | MEDICATED_PATCH | TRANSDERMAL | Status: DC
Start: 2014-10-03 — End: 2014-10-20

## 2014-10-03 NOTE — Telephone Encounter (Signed)
Gave patient avs report and appointments for September..... Scheduled by MDale

## 2014-10-03 NOTE — Addendum Note (Signed)
Encounter addended by: Norm Salt, RN on: 10/03/2014 11:55 AM<BR>     Documentation filed: Inpatient Document Flowsheet

## 2014-10-03 NOTE — Addendum Note (Signed)
Encounter addended by: Kennan Detter M Deontrae Drinkard, RN on: 10/03/2014 12:04 PM<BR>     Documentation filed: Inpatient MAR

## 2014-10-03 NOTE — Assessment & Plan Note (Signed)
He is doing well so far with radiation therapy alone. His pain is poorly controlled I will adjust his medications as outlined below Continue supportive care.

## 2014-10-03 NOTE — Assessment & Plan Note (Signed)
His pain is poorly controlled. Despite taking immediate release morphine, he had no relief. He rated his pain today at 8 out of 10 pain and has difficulty swallowing pills. I would increase his fentanyl patch to 100 micrograms and recommend he takes immediate release morphine 30 mg every 3 hours as needed for pain I will reassess his pain control next week.

## 2014-10-03 NOTE — Assessment & Plan Note (Signed)
Unfortunately, he has lost more weight since I saw him. He is not eating as much because of severe throat pain. I will increase his pain medicine and recommended increase dietary/nutritional supplement. If he lost more weight next week, I will recommend starting IV fluids

## 2014-10-03 NOTE — Progress Notes (Signed)
Weekly Management Note Current Dose:46 Gy  Projected Dose:70 Gy   Narrative:  The patient presents for routine under treatment assessment.  CBCT/MVCT images/Port film x-rays were reviewed.  The chart was checked. Using Duragesic 50 mcg patch with MSIR for breakthrough more frequently now. Pain in creasing. Abdominal wound still healing well. "Forcing" himself to eat.   Physical Findings:  Red skin over neck. No moist desquamation.   Vitals:  There were no vitals filed for this visit. Weight:  Wt Readings from Last 3 Encounters:  09/28/14 155 lb 1.6 oz (70.353 kg)  09/26/14 156 lb (70.761 kg)  09/25/14 156 lb 3.2 oz (70.852 kg)   Lab Results  Component Value Date   WBC 6.0 09/25/2014   HGB 11.4* 09/25/2014   HCT 35.0* 09/25/2014   MCV 91.8 09/25/2014   PLT 371 09/25/2014   Lab Results  Component Value Date   CREATININE 0.9 09/25/2014   BUN 7.9 09/25/2014   NA 139 09/25/2014   K 4.7 09/25/2014   CL 100* 08/12/2014   CO2 30* 09/25/2014     Impression:  The patient is tolerating radiation.  Plan:  Continue treatment as planned. Continue pain medications and discuss higher dose with Gorsuch. Continue biafene.

## 2014-10-03 NOTE — Progress Notes (Signed)
[place] OFFICE PROGRESS NOTE  Patient Care Team: [name], NP as PCP - General [name], RN as Oncology Nurse Navigator [name], MD as Consulting Physician (Radiation Oncology) [name], MD as Consulting Physician (Hematology and Oncology) [name], RD as Dietitian (Nutrition)  SUMMARY OF ONCOLOGIC HISTORY: Oncology History   Squamous cell carcinoma of larynx   Staging form: Larynx - Supraglottis, AJCC 7th Edition     Clinical stage from 07/20/2014: Stage III (T3, N0, M0) - Signed by [name], MD on 08/01/2014       Squamous cell carcinoma of larynx   06/22/2014 Imaging CT scan showed greater than 2 cm supraglottic squamous cell carcinoma of the larynx with contralateral extension. No regional pathologic adenopathy   07/12/2014 Pathology Results Accession: [id number] biopsy of larynx show squamous cell carcinoma   07/12/2014 Surgery Laryngoscopy revealed a large exophytic tumor arising from the left supraglottic larynx, aryepiglottic fold. It extended from the anterior commissure and the epiglottis anteriorly, to the arytenoid posteriorly. The entire aryepiglottic fold was involved   07/27/2014 Imaging Staging PET:  1. Intense metabolic activity associated with the left laryngeal mass.  2. Single mildly hypermetabolic small left level 2 lymph node.  No additional evidence of metastatic adenopathy in neck.  3. No evidence of thoracic metastasis.   07/28/2014 Procedure Port-a-cath placed.   08/02/2014 - 08/04/2014 Hospital Admission Admitted to ICU following allergic rxt to Cetuximab resulting in cardiac arrest   08/02/2014 Adverse Reaction The patient received a small amount of cetuximab resulting in cardiac arrest. The medication was permanently discontinued   08/04/2014 Imaging CT scan of abdomen showed neumoperitoneum. 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     INTERVAL HISTORY: Please see below for problem oriented charting. He is seen for further follow-up. He complained of severe throat pain since last week. He had difficulty swallowing. He is getting a little mucositis and pain on the skin from radiation-induced skin injury. The wound VAC was removed last week and the abdominal wound is healing. Unfortunately, he started to lose more weight.  REVIEW OF SYSTEMS:   Constitutional: Denies fevers, chills  Eyes: Denies blurriness of vision Respiratory: Denies cough, dyspnea or wheezes Cardiovascular: Denies palpitation, chest discomfort or lower extremity swelling Gastrointestinal:  Denies nausea, heartburn or change in bowel habits Lymphatics: Denies new lymphadenopathy or easy bruising Neurological:Denies numbness, tingling or new weaknesses Behavioral/Psych: Mood is stable, no new changes  All other systems were reviewed with the patient and are negative.  I have reviewed the past medical history, past surgical history, social history and family history with the patient and they are unchanged from previous note.  ALLERGIES:  is allergic to cetuximab.  MEDICATIONS:  Current Outpatient Prescriptions  Medication Sig Dispense Refill  . albuterol (PROVENTIL HFA;VENTOLIN HFA) 108 (90 BASE) MCG/ACT inhaler Inhale 2 puffs into the lungs every 6 (six) hours as needed for wheezing or shortness of breath. 1 Inhaler 0  . ALPRAZolam (XANAX) 0.5 MG tablet Take 0.5 mg by mouth 3 (three) times daily as needed for anxiety.     Marland Kitchen aspirin EC 81 MG tablet Take 81 mg by mouth daily.    Marland Kitchen  buPROPion (WELLBUTRIN XL) 300 MG 24 hr tablet Take 300 mg by mouth daily.    .  carvedilol (COREG) 12.5 MG tablet Take 0.5 tablets (6.25 mg total) by mouth 2 (two) times daily. 60 tablet 0  . fentaNYL (DURAGESIC - DOSED MCG/HR) 100 MCG/HR Place 1 patch (100 mcg total) onto the skin every 3 (three) days. 5 patch 0  . lidocaine-prilocaine (EMLA) cream Apply 1 application topically as directed. Apply to affected area once 30 g 3  . morphine (MSIR) 30 MG tablet Take 1 tablet (30 mg total) by mouth every 6 (six) hours as needed for severe pain. 60 tablet 0  . Morphine Sulfate (MORPHINE CONCENTRATE) 10 mg / 0.5 ml concentrated solution Take 1 mL (20 mg total) by mouth every 2 (two) hours as needed for severe pain. 240 mL 0  . Multiple Vitamins-Minerals (MULTIVITAMIN GUMMIES ADULT) CHEW Chew 1 each by mouth 2 (two) times daily.    . pantoprazole (PROTONIX) 40 MG tablet TAKE 1 TABLET BY MOUTH EVERY DAY 90 tablet 3  . potassium chloride SA (K-DUR,KLOR-CON) 20 MEQ tablet TK 1 T PO QD  1   No current facility-administered medications for this visit.   Facility-Administered Medications Ordered in Other Visits  Medication Dose Route Frequency Provider Last Rate Last Dose  . topical emolient (BIAFINE) emulsion   Topical PRN [name], MD        PHYSICAL EXAMINATION: ECOG PERFORMANCE STATUS: 1 - Symptomatic but completely ambulatory  Filed Vitals:   10/03/14 1159  BP: 117/74  Pulse: 97  Temp: 98.2 F (36.8 C)  Resp: 18   Filed Weights   10/03/14 1159  Weight: 152 lb 1.6 oz (68.992 kg)    GENERAL:alert, no distress and comfortable. He is in mild distress SKIN: there is significant dermatitis around his neck without any ulceration EYES: normal, Conjunctiva are pink and non-injected, sclera clear OROPHARYNX:no exudate, no erythema and lips, buccal mucosa, and tongue normal . No thrush NECK: supple, thyroid normal size, non-tender, without nodularity LYMPH:  no palpable lymphadenopathy in the cervical, axillary or inguinal NEURO: alert & oriented x 3 with fluent speech,  no focal motor/sensory deficits  LABORATORY DATA:  I have reviewed the data as listed    Component Value Date/Time   NA 139 09/25/2014 0948   NA 133* 08/12/2014 0257   K 4.7 09/25/2014 0948   K 4.0 08/12/2014 0257   CL 100* 08/12/2014 0257   CO2 30* 09/25/2014 0948   CO2 25 08/12/2014 0257   GLUCOSE 98 09/25/2014 0948   GLUCOSE 100* 08/12/2014 0257   BUN 7.9 09/25/2014 0948   BUN 9 08/12/2014 0257   CREATININE 0.9 09/25/2014 0948   CREATININE 1.03 08/12/2014 0257   CREATININE 1.29 01/11/2014 0907   CALCIUM 9.7 09/25/2014 0948   CALCIUM 8.0* 08/12/2014 0257   PROT 6.9 09/25/2014 0948   PROT 5.8* 08/05/2014 0115   ALBUMIN 3.3* 09/25/2014 0948   ALBUMIN 3.1* 08/05/2014 0115   AST 34 09/25/2014 0948   AST 38 08/05/2014 0115   ALT 25 09/25/2014 0948   ALT 41 08/05/2014 0115   ALKPHOS 140 09/25/2014 0948   ALKPHOS 71 08/05/2014 0115   BILITOT 0.34 09/25/2014 0948   BILITOT 0.4 08/05/2014 0115   GFRNONAA >60 08/12/2014 0257   GFRAA >60 08/12/2014 0257    No results found for: SPEP, UPEP  Lab Results  Component Value Date   WBC 6.0 09/25/2014   NEUTROABS 3.4 09/25/2014   HGB 11.4* 09/25/2014  HCT 35.0* 09/25/2014   MCV 91.8 09/25/2014   PLT 371 09/25/2014      Chemistry      Component Value Date/Time   NA 139 09/25/2014 0948   NA 133* 08/12/2014 0257   K 4.7 09/25/2014 0948   K 4.0 08/12/2014 0257   CL 100* 08/12/2014 0257   CO2 30* 09/25/2014 0948   CO2 25 08/12/2014 0257   BUN 7.9 09/25/2014 0948   BUN 9 08/12/2014 0257   CREATININE 0.9 09/25/2014 0948   CREATININE 1.03 08/12/2014 0257   CREATININE 1.29 01/11/2014 0907      Component Value Date/Time   CALCIUM 9.7 09/25/2014 0948   CALCIUM 8.0* 08/12/2014 0257   ALKPHOS 140 09/25/2014 0948   ALKPHOS 71 08/05/2014 0115   AST 34 09/25/2014 0948   AST 38 08/05/2014 0115   ALT 25 09/25/2014 0948   ALT 41 08/05/2014 0115   BILITOT 0.34 09/25/2014 0948   BILITOT 0.4 08/05/2014 0115      ASSESSMENT &  PLAN:  Squamous cell carcinoma of larynx He is doing well so far with radiation therapy alone. His pain is poorly controlled I will adjust his medications as outlined below Continue supportive care.      Protein calorie malnutrition Unfortunately, he has lost more weight since I saw him. He is not eating as much because of severe throat pain. I will increase his pain medicine and recommended increase dietary/nutritional supplement. If he lost more weight next week, I will recommend starting IV fluids  Throat pain in adult His pain is poorly controlled. Despite taking immediate release morphine, he had no relief. He rated his pain today at 8 out of 10 pain and has difficulty swallowing pills. I would increase his fentanyl patch to 100 micrograms and recommend he takes immediate release morphine 30 mg every 3 hours as needed for pain I will reassess his pain control next week.   No orders of the defined types were placed in this encounter.   All questions were answered. The patient knows to call the clinic with any problems, questions or concerns. No barriers to learning was detected. I spent 20 minutes counseling the patient face to face. The total time spent in the appointment was 25 minutes and more than 50% was on counseling and review of test results     [facility], [place], MD 10/03/2014 1:29 PM

## 2014-10-03 NOTE — Addendum Note (Signed)
Encounter addended by: Norm Salt, RN on: 10/03/2014 11:59 AM<BR>     Documentation filed: [employer], Orders

## 2014-10-04 ENCOUNTER — Ambulatory Visit
Admission: RE | Admit: 2014-10-04 | Discharge: 2014-10-04 | Disposition: A | Discharge status: Home / Self Care | Payer: Medicaid Other | Source: Ambulatory Visit | Attending: Radiation Oncology | Admitting: Radiation Oncology

## 2014-10-04 DIAGNOSIS — Z510 Encounter for antineoplastic radiation therapy: Secondary | ICD-10-CM | POA: Diagnosis not present

## 2014-10-05 ENCOUNTER — Ambulatory Visit
Admission: RE | Admit: 2014-10-05 | Discharge: 2014-10-05 | Disposition: A | Discharge status: Home / Self Care | Payer: Medicaid Other | Source: Ambulatory Visit | Attending: Radiation Oncology | Admitting: Radiation Oncology

## 2014-10-05 ENCOUNTER — Encounter: Payer: Self-pay | Admitting: Nutrition

## 2014-10-05 DIAGNOSIS — Z510 Encounter for antineoplastic radiation therapy: Secondary | ICD-10-CM | POA: Diagnosis not present

## 2014-10-05 NOTE — Progress Notes (Signed)
Patient did not show up for scheduled nutrition appointment. 

## 2014-10-06 ENCOUNTER — Ambulatory Visit
Admission: RE | Admit: 2014-10-06 | Discharge: 2014-10-06 | Disposition: A | Discharge status: Home / Self Care | Payer: Medicaid Other | Source: Ambulatory Visit | Attending: Radiation Oncology | Admitting: Radiation Oncology

## 2014-10-06 ENCOUNTER — Other Ambulatory Visit: Payer: Self-pay | Admitting: Radiation Oncology

## 2014-10-06 DIAGNOSIS — Z510 Encounter for antineoplastic radiation therapy: Secondary | ICD-10-CM | POA: Diagnosis not present

## 2014-10-06 MED ORDER — PROCHLORPERAZINE MALEATE 10 MG PO TABS: 10.0000 mg | ORAL_TABLET | Freq: Four times a day (QID) | ORAL | Status: DC | PRN

## 2014-10-09 ENCOUNTER — Emergency Department (HOSPITAL_COMMUNITY): Payer: Medicaid Other

## 2014-10-09 ENCOUNTER — Encounter (HOSPITAL_COMMUNITY): Payer: Self-pay | Admitting: Emergency Medicine

## 2014-10-09 ENCOUNTER — Ambulatory Visit
Admission: RE | Admit: 2014-10-09 | Discharge: 2014-10-09 | Disposition: A | Discharge status: Home / Self Care | Payer: Medicaid Other | Source: Ambulatory Visit | Attending: Radiation Oncology | Admitting: Radiation Oncology

## 2014-10-09 ENCOUNTER — Telehealth: Payer: Self-pay | Admitting: *Deleted

## 2014-10-09 ENCOUNTER — Emergency Department (HOSPITAL_COMMUNITY)
Admission: EM | Admit: 2014-10-09 | Discharge: 2014-10-09 | Disposition: A | Discharge status: Home / Self Care | Payer: Medicaid Other | Attending: Emergency Medicine | Admitting: Emergency Medicine

## 2014-10-09 ENCOUNTER — Ambulatory Visit: Admission: RE | Admit: 2014-10-09 | Payer: Medicaid Other | Source: Ambulatory Visit

## 2014-10-09 VITALS — BP 97/63 | HR 111 | Temp 99.5°F | Wt 148.2 lb

## 2014-10-09 DIAGNOSIS — I2510 Atherosclerotic heart disease of native coronary artery without angina pectoris: Secondary | ICD-10-CM | POA: Diagnosis not present

## 2014-10-09 DIAGNOSIS — Z85819 Personal history of malignant neoplasm of unspecified site of lip, oral cavity, and pharynx: Secondary | ICD-10-CM | POA: Diagnosis not present

## 2014-10-09 DIAGNOSIS — I252 Old myocardial infarction: Secondary | ICD-10-CM | POA: Diagnosis not present

## 2014-10-09 DIAGNOSIS — C329 Malignant neoplasm of larynx, unspecified: Secondary | ICD-10-CM

## 2014-10-09 DIAGNOSIS — Z8639 Personal history of other endocrine, nutritional and metabolic disease: Secondary | ICD-10-CM | POA: Diagnosis not present

## 2014-10-09 DIAGNOSIS — Z79899 Other long term (current) drug therapy: Secondary | ICD-10-CM | POA: Diagnosis not present

## 2014-10-09 DIAGNOSIS — Z951 Presence of aortocoronary bypass graft: Secondary | ICD-10-CM | POA: Insufficient documentation

## 2014-10-09 DIAGNOSIS — T66XXXA Radiation sickness, unspecified, initial encounter: Secondary | ICD-10-CM | POA: Diagnosis not present

## 2014-10-09 DIAGNOSIS — I10 Essential (primary) hypertension: Secondary | ICD-10-CM | POA: Insufficient documentation

## 2014-10-09 DIAGNOSIS — R110 Nausea: Secondary | ICD-10-CM | POA: Insufficient documentation

## 2014-10-09 DIAGNOSIS — F419 Anxiety disorder, unspecified: Secondary | ICD-10-CM | POA: Diagnosis not present

## 2014-10-09 DIAGNOSIS — Z7982 Long term (current) use of aspirin: Secondary | ICD-10-CM | POA: Insufficient documentation

## 2014-10-09 DIAGNOSIS — R4182 Altered mental status, unspecified: Secondary | ICD-10-CM | POA: Diagnosis present

## 2014-10-09 DIAGNOSIS — K219 Gastro-esophageal reflux disease without esophagitis: Secondary | ICD-10-CM | POA: Diagnosis not present

## 2014-10-09 DIAGNOSIS — Z9889 Other specified postprocedural states: Secondary | ICD-10-CM | POA: Insufficient documentation

## 2014-10-09 DIAGNOSIS — Z8669 Personal history of other diseases of the nervous system and sense organs: Secondary | ICD-10-CM | POA: Insufficient documentation

## 2014-10-09 DIAGNOSIS — Z87891 Personal history of nicotine dependence: Secondary | ICD-10-CM | POA: Insufficient documentation

## 2014-10-09 DIAGNOSIS — L98499 Non-pressure chronic ulcer of skin of other sites with unspecified severity: Secondary | ICD-10-CM | POA: Diagnosis not present

## 2014-10-09 DIAGNOSIS — R05 Cough: Secondary | ICD-10-CM | POA: Insufficient documentation

## 2014-10-09 DIAGNOSIS — I5043 Acute on chronic combined systolic (congestive) and diastolic (congestive) heart failure: Secondary | ICD-10-CM | POA: Diagnosis not present

## 2014-10-09 LAB — COMPREHENSIVE METABOLIC PANEL
ALBUMIN: 3.4 g/dL — AB (ref 3.5–5.0)
ALT: 15 U/L — ABNORMAL LOW (ref 17–63)
ANION GAP: 8 (ref 5–15)
AST: 23 U/L (ref 15–41)
Alkaline Phosphatase: 115 U/L (ref 38–126)
BUN: 9 mg/dL (ref 6–20)
CO2: 30 mmol/L (ref 22–32)
Calcium: 9 mg/dL (ref 8.9–10.3)
Chloride: 98 mmol/L — ABNORMAL LOW (ref 101–111)
Creatinine, Ser: 0.82 mg/dL (ref 0.61–1.24)
GFR calc Af Amer: >60 mL/min (ref >60)
GFR calc non Af Amer: >60 mL/min (ref >60)
GLUCOSE: 118 mg/dL — AB (ref 65–99)
POTASSIUM: 4.3 mmol/L (ref 3.5–5.1)
SODIUM: 136 mmol/L (ref 135–145)
Total Bilirubin: 0.5 mg/dL (ref 0.3–1.2)
Total Protein: 6.9 g/dL (ref 6.5–8.1)

## 2014-10-09 LAB — CBG MONITORING, ED: GLUCOSE-CAPILLARY: 104 mg/dL — AB (ref 65–99)

## 2014-10-09 LAB — I-STAT CG4 LACTIC ACID, ED: Lactic Acid, Venous: 1.21 mmol/L (ref 0.5–2.0)

## 2014-10-09 LAB — CBC
HEMATOCRIT: 34.6 % — AB (ref 39.0–52.0)
HEMOGLOBIN: 11 g/dL — AB (ref 13.0–17.0)
MCH: 29.3 pg (ref 26.0–34.0)
MCHC: 31.8 g/dL (ref 30.0–36.0)
MCV: 92.3 fL (ref 78.0–100.0)
Platelets: 277 10*3/uL (ref 150–400)
RBC: 3.75 MIL/uL — ABNORMAL LOW (ref 4.22–5.81)
RDW: 15 % (ref 11.5–15.5)
WBC: 10.6 10*3/uL — ABNORMAL HIGH (ref 4.0–10.5)

## 2014-10-09 MED ORDER — SODIUM CHLORIDE 0.9 % IV BOLUS (SEPSIS)
500.0000 mL | Freq: Once | INTRAVENOUS | Status: AC
  Administered 2014-10-09: 500 mL via INTRAVENOUS

## 2014-10-09 NOTE — Telephone Encounter (Signed)
PT ALSO HAS AN ALTERED MENTAL STATUS. HE IS TO BE RECERTIFIED FOR ANOTHER NINE WEEKS.

## 2014-10-09 NOTE — ED Notes (Signed)
Patient was alert, oriented and stable upon discharge. RN went over AVS and patient had no further questions.  

## 2014-10-09 NOTE — Telephone Encounter (Signed)
AT 1:20PM LEFT MESSAGE FOR DEBBIE WITH [facility].

## 2014-10-09 NOTE — Progress Notes (Signed)
Department of Radiation Oncology  Phone:  [phone] Fax:        [phone]  Weekly Treatment Note    Name: [name] Date: 10/09/2014 MRN: [id number] DOB: 03-09-1955   Current dose: 56 Gy  Current fraction:28   MEDICATIONS: Current Outpatient Prescriptions  Medication Sig Dispense Refill  . acetaminophen (TYLENOL) 500 MG tablet Take 1,000 mg by mouth every 6 (six) hours as needed for mild pain, moderate pain or headache.    . albuterol (PROVENTIL HFA;VENTOLIN HFA) 108 (90 BASE) MCG/ACT inhaler Inhale 2 puffs into the lungs every 6 (six) hours as needed for wheezing or shortness of breath. 1 Inhaler 0  . ALPRAZolam (XANAX) 0.5 MG tablet Take 0.5 mg by mouth 3 (three) times daily as needed for anxiety.     Marland Kitchen aspirin EC 81 MG tablet Take 81 mg by mouth daily.    Marland Kitchen buPROPion (WELLBUTRIN XL) 300 MG 24 hr tablet Take 300 mg by mouth daily.    . carvedilol (COREG) 12.5 MG tablet Take 0.5 tablets (6.25 mg total) by mouth 2 (two) times daily. 60 tablet 0  . fentaNYL (DURAGESIC - DOSED MCG/HR) 100 MCG/HR Place 1 patch (100 mcg total) onto the skin every 3 (three) days. 5 patch 0  . lidocaine-prilocaine (EMLA) cream Apply 1 application topically as directed. Apply to affected area once 30 g 3  . morphine (MSIR) 30 MG tablet Take 1 tablet (30 mg total) by mouth every 6 (six) hours as needed for severe pain. 60 tablet 0  . Morphine Sulfate (MORPHINE CONCENTRATE) 10 mg / 0.5 ml concentrated solution Take 1 mL (20 mg total) by mouth every 2 (two) hours as needed for severe pain. 240 mL 0  . pantoprazole (PROTONIX) 40 MG tablet TAKE 1 TABLET BY MOUTH EVERY DAY (Patient taking differently: TAKE 40 MG BY MOUTH DAILY) 90 tablet 3  . potassium chloride SA (K-DUR,KLOR-CON) 20 MEQ tablet TAKE 20 MEQ BY MOUTH ONCE DAILY  1  . prochlorperazine (COMPAZINE) 10 MG tablet Take 1 tablet (10 mg total) by mouth every 6 (six) hours as needed for nausea or vomiting. 60 tablet 1   No current  facility-administered medications for this encounter.     ALLERGIES: Cetuximab   LABORATORY DATA:  Lab Results  Component Value Date   WBC 10.6* 10/09/2014   HGB 11.0* 10/09/2014   HCT 34.6* 10/09/2014   MCV 92.3 10/09/2014   PLT 277 10/09/2014   Lab Results  Component Value Date   NA 136 10/09/2014   K 4.3 10/09/2014   CL 98* 10/09/2014   CO2 30 10/09/2014   Lab Results  Component Value Date   ALT 15* 10/09/2014   AST 23 10/09/2014   ALKPHOS 115 10/09/2014   BILITOT 0.5 10/09/2014     NARRATIVE: [name] was seen today for weekly treatment management. The chart was checked and the patient's films were reviewed.  Patient for assessment prior to treatment.discharged from emergency room this evening after going for fever of 100.5 and skin changes.coughing up thick yellow secretions.chest x-ray was negative, labs within normal range.See report from emergency department.  PHYSICAL EXAMINATION: weight is 148 lb 3.2 oz (67.223 kg). His temperature is 99.5 F (37.5 C). His blood pressure is 97/63 and his pulse is 111. His oxygen saturation is 94%.      Significant moist desquamation on neck bilaterally Oral cavity clear of thick mucous or secretions   ASSESSMENT: The patient Has some significant skin irritation and  thick saliva secondary to radiation treatment   PLAN: We will continue with the patient's radiation treatment as planned. Nursing advised baking soda rinses. Will order suction device for home use to help with secretions. Patient felt ok to continue radiation treatment today.   Marland Kitchen------------------------------------------------  [name], MD, PhD  This document serves as a record of services personally performed by Kyung Rudd, MD. It was created on his behalf by Derek Mound, a trained medical scribe. The creation of this record is based on the scribe's personal observations and the provider's statements to them. This document has been checked and  approved by the attending provider.

## 2014-10-09 NOTE — Progress Notes (Signed)
BP 97/63 mmHg  Pulse 111  Temp(Src) 99.5 F (37.5 C)  Wt 148 lb 3.2 oz (67.223 kg)  SpO2 94%  Wt Readings from Last 3 Encounters:  10/09/14 148 lb 3.2 oz (67.223 kg)  10/03/14 152 lb 1.6 oz (68.992 kg)  10/03/14 152 lb 3.2 oz (69.037 kg)   Patient for assessment prior to treatment.discharged from emergency room this evening after going for fever of 100.5 and skin changes.coughing up thick yellow secretions.chest x-ray was negative, labs within normal range.See report from emergency department.

## 2014-10-09 NOTE — ED Notes (Addendum)
Patient with throat cancer, has had confusion and AMS x2 days. Patient home health nurse reports a fever 101.5-101.8, anorexia x2 days, productive cough with green mucous. Patient with emesis on Friday and Saturday took Compazine with minimal relief. Patient last tylenol yesterday. Patient has difficulty swallowing at baseline.

## 2014-10-09 NOTE — ED Provider Notes (Signed)
CSN: [id number]     Arrival date & time 10/09/14  1035 History   First MD Initiated Contact with Patient 10/09/14 1148     Chief Complaint  Patient presents with  . Altered Mental Status    HPI Pt has history of throat cancer.  He has not been getting chemotherapy.   He has been getting radiation treatments.  Pt started having trouble with nausea and spitting up green mucus on Friday.  He has been coughing.  He had a temperature at home up to 101 yesterday and today.  No new rashes.  No diarrhea.  His neck rash has been getting worse.  He has noticed some yellowish drainage.   Past Medical History  Diagnosis Date  . Carotid stenosis   . Coronary artery disease     s/p CABG 2006; stenting to vein graft 05-2013; NSTEMI 06-2013 in setting of PEA arrest  . Subclavian artery stenosis, left     s/p stenting 2006  . Hypertension   . Hyperlipidemia   . Ischemic cardiomyopathy     echo 06/2013 EF 10-15% (normal 01/2013)  . At risk for sudden cardiac death, has lifevest at discharge 06/20/2013  . Shock liver 06/20/2013  . Acute MI, troponin > 20, no obvious culprit vessel 06/20/2013  . Hypothermia, induced, post arrest 06/20/2013  . Acute encephalopathy, improved 06/20/2013  . Dizziness 06/20/2013  . Weakness due to cardiac arrest 06/20/2013  . Fever, possible aspiration-treated with antibiotics 06/20/2013  . Tobacco abuse     >100 pack year history   . Dyslipidemia 10/09/2013    [place] Study atorvastatin -eIRB # [id number] tablet Take 40 mg by mouth daily.   Marland Kitchen PAD (peripheral artery disease) 10/09/2013    Occluded left common carotid and moderate right internal carotid artery stenosis. Previous stent to the left subclavian artery. Right iliac stent. Bilateral 60-99% renal artery stenosis    . Heart failure, acute on chronic, systolic and diastolic 54/56/2563  . Anxiety   . GERD (gastroesophageal reflux disease)   . 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  Angioplasty  05/2013    stent to SVG - LAD vein graft at [facility]  . Coronary artery bypass graft  09/2004    SVG to LAD (at [facility])  . Nm myocar perf wall motion  06/2008    persantine myoview - normal pattern of systolic thickening/wall motion/perfusion;  . Lower extremity arterial doppler  08/2008    right and left ABI - mild arterial insufficiency at rest; R CIA/stent with 50-69% narrowing, L CIA with increased velocities (50-69% diameter reduction)  . Renal artery doppler  05/2008    right and left prox renal arteries with narrowing and increased velocities (60-99% diameter reduction)  . Iliac artery stent  07/24/2008    8x6 Smart nitinol self-expanding stent to origin of ilaci down into external iliac crossing the hypogastric artery (Dr. Adora Fridge)  . Subclavian angiogram Left 08/20/2004    8x5mm Genesis balloon mounted stent  . Left heart catheterization with coronary/graft angiogram N/A 06/15/2013    Procedure: LEFT HEART CATHETERIZATION WITH Beatrix Fetters;  Surgeon: Sinclair Grooms, MD;  Location: [facility] CATH LAB;  Service: Cardiovascular;  Laterality: N/A;  . Cardiac catheterization    . Direct laryngoscopy N/A 07/06/2014    Procedure: DIRECT LARYNGOSCOPY AND ESOPAGOSCOPY WITH BIOPSY;  Surgeon: [name], MD;  Location: [place];  Service: ENT;  Laterality: N/A;  . Laparotomy N/A  08/05/2014    Procedure: EXPLORATORY LAPAROTOMY WITH SIGMOIDOTOMY WITH COLECTOMY & COLOSTOMY;  Surgeon: Coralie Keens, MD;  Location: [place];  Service: General;  Laterality: N/A;   Family History  Problem Relation Age of Onset  . Heart disease Maternal Aunt   . Heart attack Mother   . Stroke Mother   . Cancer Father   . Stroke Sister   . Heart Problems Sister    Social History  Substance Use Topics  . Smoking status: Former Smoker -- 4.00 packs/day for 45 years    Types: Cigarettes    Quit date: 06/09/2013  . Smokeless tobacco: Former Systems developer     Comment: tried for a few weeks  . Alcohol Use: No      Comment: 3-6 12 oz cans daily     Review of Systems  All other systems reviewed and are negative.     Allergies  Cetuximab  Home Medications   Prior to Admission medications   Medication Sig Start Date End Date Taking? Authorizing Provider  acetaminophen (TYLENOL) 500 MG tablet Take 1,000 mg by mouth every 6 (six) hours as needed for mild pain, moderate pain or headache.   Yes Historical Provider, MD  albuterol (PROVENTIL HFA;VENTOLIN HFA) 108 (90 BASE) MCG/ACT inhaler Inhale 2 puffs into the lungs every 6 (six) hours as needed for wheezing or shortness of breath. 08/12/14  Yes Shanker Kristeen Mans, MD  ALPRAZolam Duanne Moron) 0.5 MG tablet Take 0.5 mg by mouth 3 (three) times daily as needed for anxiety.    Yes Historical Provider, MD  aspirin EC 81 MG tablet Take 81 mg by mouth daily.   Yes Historical Provider, MD  buPROPion (WELLBUTRIN XL) 300 MG 24 hr tablet Take 300 mg by mouth daily.   Yes Historical Provider, MD  carvedilol (COREG) 12.5 MG tablet Take 0.5 tablets (6.25 mg total) by mouth 2 (two) times daily. 08/12/14  Yes Shanker Kristeen Mans, MD  fentaNYL (DURAGESIC - DOSED MCG/HR) 100 MCG/HR Place 1 patch (100 mcg total) onto the skin every 3 (three) days. 10/03/14  Yes [name], MD  lidocaine-prilocaine (EMLA) cream Apply 1 application topically as directed. Apply to affected area once 09/05/14  Yes [name], MD  morphine (MSIR) 30 MG tablet Take 1 tablet (30 mg total) by mouth every 6 (six) hours as needed for severe pain. 09/22/14  Yes [name], MD  Morphine Sulfate (MORPHINE CONCENTRATE) 10 mg / 0.5 ml concentrated solution Take 1 mL (20 mg total) by mouth every 2 (two) hours as needed for severe pain. 09/05/14  Yes [name], MD  pantoprazole (PROTONIX) 40 MG tablet TAKE 1 TABLET BY MOUTH EVERY DAY Patient taking differently: TAKE 40 MG BY MOUTH DAILY 04/17/14  Yes Mihai Croitoru, MD  potassium chloride SA (K-DUR,KLOR-CON) 20 MEQ tablet TAKE 20 MEQ BY MOUTH ONCE DAILY 06/08/14  Yes  Historical Provider, MD  prochlorperazine (COMPAZINE) 10 MG tablet Take 1 tablet (10 mg total) by mouth every 6 (six) hours as needed for nausea or vomiting. 10/06/14  Yes [name], MD   BP 101/61 mmHg  Pulse 94  Temp(Src) 98.8 F (37.1 C) (Oral)  Resp 16  SpO2 96% Physical Exam  Constitutional: No distress.  HENT:  Head: Normocephalic and atraumatic.  Right Ear: External ear normal.  Left Ear: External ear normal.  Mouth/Throat: No oropharyngeal exudate.  Hoarse voice   Eyes: Conjunctivae are normal. Right eye exhibits no discharge. Left eye exhibits no discharge. No scleral icterus.  Neck: Neck  supple. No tracheal deviation present.  Erythematous rash on skin, yellow crusting and drainage on the skin surface  Cardiovascular: Normal rate, regular rhythm and intact distal pulses.   Pulmonary/Chest: Effort normal and breath sounds normal. No stridor. No respiratory distress. He has no wheezes. He has no rales.  Abdominal: Soft. Bowel sounds are normal. He exhibits no distension. There is no tenderness. There is no rebound and no guarding.  Musculoskeletal: He exhibits no edema or tenderness.  Neurological: He is alert. He has normal strength. No cranial nerve deficit (no facial droop, extraocular movements intact, no slurred speech) or sensory deficit. He exhibits normal muscle tone. He displays no seizure activity. Coordination normal.  Skin: Skin is warm and dry. No rash noted. He is not diaphoretic.  Psychiatric: He has a normal mood and affect.  Nursing note and vitals reviewed.   ED Course  Procedures (including critical care time) Labs Review Labs Reviewed  COMPREHENSIVE METABOLIC PANEL - Abnormal; Notable for the following:    Chloride 98 (*)    Glucose, Bld 118 (*)    Albumin 3.4 (*)    ALT 15 (*)    All other components within normal limits  CBC - Abnormal; Notable for the following:    WBC 10.6 (*)    RBC 3.75 (*)    Hemoglobin 11.0 (*)    HCT 34.6 (*)     All other components within normal limits  CBG MONITORING, ED - Abnormal; Notable for the following:    Glucose-Capillary 104 (*)    All other components within normal limits  CBG MONITORING, ED  I-STAT CG4 LACTIC ACID, ED  I-STAT CG4 LACTIC ACID, ED    Imaging Review Dg Chest 2 View  10/09/2014   CLINICAL DATA:  Productive cough.  EXAM: CHEST  2 VIEW  COMPARISON:  August 06, 2014.  FINDINGS: The heart size and mediastinal contours are within normal limits. Both lungs are clear. Sternotomy wires are noted. No pneumothorax or pleural effusion is noted. Right internal jugular Port-A-Cath is noted with distal tip overlying expected position of the SVC. The visualized skeletal structures are unremarkable.  IMPRESSION: No active cardiopulmonary disease.   Electronically Signed   By: Marijo Conception, M.D.   On: 10/09/2014 13:18   I have personally reviewed and evaluated these images and lab results as part of my medical decision-making.   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 He is afebrile and his laboratory tests are reassuring. He does not appear to have evidence of systemic bacterial infection. He does have skin ulceration and radiation changes associated with his radiation treatments. I discussed the case with Dr. Pablo Ledger. She saw the patient the other day and the skin changes were typical for his radiation treatments. I will not start him on antibiotics at this point.  The patient will go to the radiation oncology clinic today to continue his treatments.    Dorie Rank, MD 10/09/14 1538

## 2014-10-10 ENCOUNTER — Ambulatory Visit
Admission: RE | Admit: 2014-10-10 | Discharge: 2014-10-10 | Disposition: A | Discharge status: Home / Self Care | Payer: Medicaid Other | Source: Ambulatory Visit | Attending: Radiation Oncology | Admitting: Radiation Oncology

## 2014-10-10 ENCOUNTER — Ambulatory Visit (HOSPITAL_BASED_OUTPATIENT_CLINIC_OR_DEPARTMENT_OTHER): Payer: Medicaid Other | Admitting: Hematology and Oncology

## 2014-10-10 ENCOUNTER — Other Ambulatory Visit: Payer: Self-pay

## 2014-10-10 ENCOUNTER — Other Ambulatory Visit: Payer: Self-pay | Admitting: Hematology and Oncology

## 2014-10-10 ENCOUNTER — Encounter: Payer: Self-pay | Admitting: Hematology and Oncology

## 2014-10-10 ENCOUNTER — Telehealth: Payer: Self-pay | Admitting: Hematology and Oncology

## 2014-10-10 ENCOUNTER — Ambulatory Visit: Payer: Self-pay | Admitting: Nutrition

## 2014-10-10 ENCOUNTER — Encounter: Payer: Self-pay | Admitting: *Deleted

## 2014-10-10 ENCOUNTER — Ambulatory Visit (HOSPITAL_BASED_OUTPATIENT_CLINIC_OR_DEPARTMENT_OTHER): Payer: Medicaid Other

## 2014-10-10 ENCOUNTER — Telehealth: Payer: Self-pay | Admitting: *Deleted

## 2014-10-10 VITALS — BP 102/65 | HR 97 | Temp 98.3°F | Wt 146.9 lb

## 2014-10-10 VITALS — BP 102/65 | HR 97 | Temp 98.3°F | Resp 16 | Ht 67.0 in | Wt 146.0 lb

## 2014-10-10 VITALS — BP 102/53 | HR 85

## 2014-10-10 DIAGNOSIS — E46 Unspecified protein-calorie malnutrition: Secondary | ICD-10-CM | POA: Diagnosis not present

## 2014-10-10 DIAGNOSIS — D72829 Elevated white blood cell count, unspecified: Secondary | ICD-10-CM | POA: Diagnosis not present

## 2014-10-10 DIAGNOSIS — C321 Malignant neoplasm of supraglottis: Secondary | ICD-10-CM | POA: Diagnosis present

## 2014-10-10 DIAGNOSIS — J988 Other specified respiratory disorders: Secondary | ICD-10-CM

## 2014-10-10 DIAGNOSIS — L989 Disorder of the skin and subcutaneous tissue, unspecified: Secondary | ICD-10-CM | POA: Insufficient documentation

## 2014-10-10 DIAGNOSIS — Z510 Encounter for antineoplastic radiation therapy: Secondary | ICD-10-CM | POA: Diagnosis not present

## 2014-10-10 DIAGNOSIS — R509 Fever, unspecified: Secondary | ICD-10-CM

## 2014-10-10 DIAGNOSIS — C329 Malignant neoplasm of larynx, unspecified: Secondary | ICD-10-CM

## 2014-10-10 DIAGNOSIS — J069 Acute upper respiratory infection, unspecified: Secondary | ICD-10-CM

## 2014-10-10 DIAGNOSIS — R070 Pain in throat: Secondary | ICD-10-CM

## 2014-10-10 MED ORDER — SODIUM CHLORIDE 0.9 % IV SOLN
INTRAVENOUS | Status: AC
  Administered 2014-10-10: 15:00:00 via INTRAVENOUS

## 2014-10-10 MED ORDER — SODIUM CHLORIDE 0.9 % IV SOLN
Freq: Once | INTRAVENOUS | Status: AC
  Administered 2014-10-10: 13:00:00 via INTRAVENOUS
  Filled 2014-10-10: qty 4

## 2014-10-10 MED ORDER — SODIUM CHLORIDE 0.9 % IV SOLN
Freq: Once | INTRAVENOUS | Status: AC
  Administered 2014-10-10: 13:00:00 via INTRAVENOUS

## 2014-10-10 MED ORDER — HYDROMORPHONE HCL 4 MG/ML IJ SOLN
4.0000 mg | Freq: Every day | INTRAMUSCULAR | Status: DC
  Administered 2014-10-10: 4 mg via INTRAVENOUS

## 2014-10-10 MED ORDER — HEPARIN SOD (PORK) LOCK FLUSH 100 UNIT/ML IV SOLN
500.0000 [IU] | Freq: Once | INTRAVENOUS | Status: DC | PRN
  Filled 2014-10-10: qty 5

## 2014-10-10 MED ORDER — AMOXICILLIN-POT CLAVULANATE 875-125 MG PO TABS: 1.0000 | ORAL_TABLET | Freq: Two times a day (BID) | ORAL | Status: DC

## 2014-10-10 MED ORDER — SODIUM CHLORIDE 0.9 % IJ SOLN
10.0000 mL | INTRAMUSCULAR | Status: DC | PRN
  Administered 2014-10-10: 10 mL via INTRAVENOUS
  Filled 2014-10-10: qty 10

## 2014-10-10 MED ORDER — HEPARIN SOD (PORK) LOCK FLUSH 100 UNIT/ML IV SOLN
500.0000 [IU] | Freq: Once | INTRAVENOUS | Status: AC
Start: 2014-10-10 — End: 2014-10-10
  Administered 2014-10-10: 500 [IU] via INTRAVENOUS
  Filled 2014-10-10: qty 5

## 2014-10-10 MED ORDER — SODIUM CHLORIDE 0.9 % IJ SOLN
10.0000 mL | INTRAMUSCULAR | Status: DC | PRN
  Filled 2014-10-10: qty 10

## 2014-10-10 MED ORDER — MORPHINE SULFATE 30 MG PO TABS: 30.0000 mg | ORAL_TABLET | ORAL | Status: DC | PRN

## 2014-10-10 MED ORDER — HYDROMORPHONE HCL 4 MG/ML IJ SOLN
INTRAMUSCULAR | Status: AC
  Filled 2014-10-10: qty 1

## 2014-10-10 NOTE — Assessment & Plan Note (Signed)
He has progressive, worsening pain. We will maintain fentanyl patch 100 g. I would increase ir morphine to 30 mg every 4 hours as needed along with pain medicine intravenously daily.

## 2014-10-10 NOTE — Progress Notes (Signed)
  Oncology Nurse Navigator Documentation   Navigator Encounter Type: Clinic/MDC;Treatment (10/10/14 1155) Patient Visit Type: Medonc;Radonc (10/10/14 1155)     To provide support and encouragement, care continuity and to assess for needs, met with patient during Tomo tmt and est pt appt with Dr. Alvy Bimler.  He was accompanied by his m-in-law Vivien Rota and 2 dtrs. Vivien Rota reported he has been experiencing increased confusion over past days. Neck is excoriated with some areas of moist desquamation.  We discussed value of BID application of Biafine to neck to mitigate dryness, sooth irritation.  He verbalized understanding not to apply to raw areas.  He was encouraged to wear button shirt and to leave open at neck to minimize irration. He agreed to Dr. Calton Dach strong recommendation to receive IVF daily and on Saturdays for the upcoming weeks to mitigate dehydration. He verbalized understanding that daily IVF will allow him to focus on oral intake of much needed nutrition (he has lost 7 lbs since last week). I will continue to follow closely.  [name], RN, BSN, [facility] [place] [phone]                    Time Spent with Patient: 75 (10/10/14 1155)

## 2014-10-10 NOTE — Assessment & Plan Note (Signed)
He has progressive decline in weight and performance status. I recommend the patient to stay to receive IV fluids daily for hydration and IV pain management. He agreed to proceed. I will continue to see him on a weekly basis to perform supportive care.

## 2014-10-10 NOTE — Progress Notes (Signed)
Nutrition follow-up completed with patient during IV fluids.  Patient continues radiation therapy for supraglottic cancer diagnosed in June 2016. Weight continues to decline and was documented as 146.9 pounds September 27, down from 158.2 pounds August 31. Patient complains of increased pain and mucus. Patient states he can only tolerate liquids by mouth and is consuming, at best, two Ensure Plus a day. Observed patient drinking ensure plus.  Patient immediately coughed up the swallow of Ensure Plus along with thick mucus.  Estimated nutrition needs: 2100-2350 calories, 85-100 grams protein, 2.3 L fluid.  Nutrition diagnosis: Unintended weight loss continues.  Intervention:  Provided patient with samples of higher calorie supplements which would allow him to only need to consume 4 daily. Encouraged patient to work on increasing Ensure Plus or other supplements to avoid placement of feeding tube. Patient will be receiving daily IV fluids.  Hopefully this will allow patient to thin down secretions and increase oral intake. If patient unable to tolerate oral nutrition supplements, recommend tube feeding placement. Teach back method used.  Samples were provided.  Monitoring, evaluation, goals: Patient will work to increase oral intake of oral nutrition supplements.  Next visit: Friday, September 30, during IV fluids.  **Disclaimer: This note was dictated with voice recognition software. Similar sounding words can inadvertently be transcribed and this note may contain transcription errors which may not have been corrected upon publication of note.**

## 2014-10-10 NOTE — Progress Notes (Signed)
[facility] OFFICE PROGRESS NOTE  Patient Care Team: [name], NP as PCP - General [name], RN as Oncology Nurse Navigator [name], MD as Consulting Physician (Radiation Oncology) [name], MD as Consulting Physician (Hematology and Oncology) [name], RD as Dietitian (Nutrition)  SUMMARY OF ONCOLOGIC HISTORY: Oncology History   Squamous cell carcinoma of larynx   Staging form: Larynx - Supraglottis, AJCC 7th Edition     Clinical stage from 07/20/2014: Stage III (T3, N0, M0) - Signed by [name], MD on 08/01/2014       Squamous cell carcinoma of larynx   06/22/2014 Imaging CT scan showed greater than 2 cm supraglottic squamous cell carcinoma of the larynx with contralateral extension. No regional pathologic adenopathy   07/12/2014 Pathology Results Accession: [id number] biopsy of larynx show squamous cell carcinoma   07/12/2014 Surgery Laryngoscopy revealed a large exophytic tumor arising from the left supraglottic larynx, aryepiglottic fold. It extended from the anterior commissure and the epiglottis anteriorly, to the arytenoid posteriorly. The entire aryepiglottic fold was involved   07/27/2014 Imaging Staging PET:  1. Intense metabolic activity associated with the left laryngeal mass.  2. Single mildly hypermetabolic small left level 2 lymph node.  No additional evidence of metastatic adenopathy in neck.  3. No evidence of thoracic metastasis.   07/28/2014 Procedure Port-a-cath placed.   08/02/2014 - 08/04/2014 Hospital Admission Admitted to ICU following allergic rxt to Cetuximab resulting in cardiac arrest   08/02/2014 Adverse Reaction The patient received a small amount of cetuximab resulting in cardiac arrest. The medication was permanently discontinued   08/04/2014 Imaging CT scan of abdomen showed neumoperitoneum. 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     INTERVAL HISTORY: Please see below for problem oriented charting. He is seen today for further follow-up. He actually went to the emergency department due to fever. He had a productive greenish sputum. He have worsening skin ulceration around his neck. He continues to have weight loss and have difficulties eating because of pain. His pain is better controlled with increased dose of fentanyl patch and increase frequency of immediate release morphine. He denies nausea or vomiting.  REVIEW OF SYSTEMS:   Eyes: Denies blurriness of vision Respiratory: Denies cough, dyspnea or wheezes Cardiovascular: Denies palpitation, chest discomfort or lower extremity swelling Gastrointestinal:  Denies nausea, heartburn or change in bowel habits Lymphatics: Denies new lymphadenopathy or easy bruising Neurological:Denies numbness, tingling or new weaknesses Behavioral/Psych: Mood is stable, no new changes  All other systems were reviewed with the patient and are negative.  I have reviewed the past medical history, past surgical history, social history and family history with the patient and they are unchanged from previous note.  ALLERGIES:  is allergic to cetuximab.  MEDICATIONS:  Current Outpatient Prescriptions  Medication Sig Dispense Refill  . acetaminophen (TYLENOL) 500 MG tablet Take 1,000 mg by mouth every 6 (six) hours as needed for mild pain, moderate pain or headache.    . albuterol (PROVENTIL HFA;VENTOLIN HFA) 108 (90 BASE) MCG/ACT inhaler Inhale 2 puffs into the lungs every 6 (six) hours as needed for wheezing or shortness of breath. 1 Inhaler 0  . ALPRAZolam (  XANAX) 0.5 MG tablet Take 0.5 mg by  mouth 3 (three) times daily as needed for anxiety.     Marland Kitchen amoxicillin-clavulanate (AUGMENTIN) 875-125 MG tablet Take 1 tablet by mouth 2 (two) times daily. 14 tablet 0  . aspirin EC 81 MG tablet Take 81 mg by mouth daily.    Marland Kitchen buPROPion (WELLBUTRIN XL) 300 MG 24 hr tablet Take 300 mg by mouth daily.    . carvedilol (COREG) 12.5 MG tablet Take 0.5 tablets (6.25 mg total) by mouth 2 (two) times daily. 60 tablet 0  . fentaNYL (DURAGESIC - DOSED MCG/HR) 100 MCG/HR Place 1 patch (100 mcg total) onto the skin every 3 (three) days. 5 patch 0  . lidocaine-prilocaine (EMLA) cream Apply 1 application topically as directed. Apply to affected area once 30 g 3  . morphine (MSIR) 30 MG tablet Take 1 tablet (30 mg total) by mouth every 4 (four) hours as needed for severe pain. 90 tablet 0  . Morphine Sulfate (MORPHINE CONCENTRATE) 10 mg / 0.5 ml concentrated solution Take 1 mL (20 mg total) by mouth every 2 (two) hours as needed for severe pain. 240 mL 0  . pantoprazole (PROTONIX) 40 MG tablet TAKE 1 TABLET BY MOUTH EVERY DAY (Patient taking differently: TAKE 40 MG BY MOUTH DAILY) 90 tablet 3  . potassium chloride SA (K-DUR,KLOR-CON) 20 MEQ tablet TAKE 20 MEQ BY MOUTH ONCE DAILY  1  . prochlorperazine (COMPAZINE) 10 MG tablet Take 1 tablet (10 mg total) by mouth every 6 (six) hours as needed for nausea or vomiting. 60 tablet 1   Current Facility-Administered Medications  Medication Dose Route Frequency Provider Last Rate Last Dose  . heparin lock flush 100 unit/mL  500 Units Intracatheter Once PRN [name], MD      . HYDROmorphone (DILAUDID) injection 4 mg  4 mg Intravenous Daily [name], MD   4 mg at 10/10/14 1315  . ondansetron (ZOFRAN) 8 mg in sodium chloride 0.9 % 50 mL IVPB   Intravenous Once Ni Gorsuch, MD      . sodium chloride 0.9 % injection 10 mL  10 mL Intracatheter PRN [name], MD        PHYSICAL EXAMINATION: ECOG PERFORMANCE STATUS: 1 - Symptomatic but completely ambulatory  Filed  Vitals:   10/10/14 1222  BP: 102/65  Pulse: 97  Temp: 98.3 F (36.8 C)  Resp: 16   Filed Weights   10/10/14 1222  Weight: 146 lb (66.225 kg)    GENERAL:alert, no distress and comfortable. He looks thin and appears cachectic SKIN: Significant worsening skin rash/ulceration around his neck. EYES: normal, Conjunctiva are pink and non-injected, sclera clear OROPHARYNX:no exudate, no erythema and lips, buccal mucosa, and tongue normal . Dry mucous membrane is noted NECK: Skin ulceration. LYMPH:  no palpable lymphadenopathy in the cervical, axillary or inguinal LUNGS: clear to auscultation and percussion with normal breathing effort HEART: regular rate & rhythm and no murmurs and no lower extremity edema ABDOMEN:abdomen soft, non-tender and normal bowel sounds Musculoskeletal:no cyanosis of digits and no clubbing  NEURO: alert & oriented x 3 with fluent speech, no focal motor/sensory deficits  LABORATORY DATA:  I have reviewed the data as listed    Component Value Date/Time   NA 136 10/09/2014 1138   NA 139 09/25/2014 0948   K 4.3 10/09/2014 1138   K 4.7 09/25/2014 0948   CL 98* 10/09/2014 1138   CO2 30 10/09/2014 1138   CO2 30* 09/25/2014 0948   GLUCOSE  118* 10/09/2014 1138   GLUCOSE 98 09/25/2014 0948   BUN 9 10/09/2014 1138   BUN 7.9 09/25/2014 0948   CREATININE 0.82 10/09/2014 1138   CREATININE 0.9 09/25/2014 0948   CREATININE 1.29 01/11/2014 0907   CALCIUM 9.0 10/09/2014 1138   CALCIUM 9.7 09/25/2014 0948   PROT 6.9 10/09/2014 1138   PROT 6.9 09/25/2014 0948   ALBUMIN 3.4* 10/09/2014 1138   ALBUMIN 3.3* 09/25/2014 0948   AST 23 10/09/2014 1138   AST 34 09/25/2014 0948   ALT 15* 10/09/2014 1138   ALT 25 09/25/2014 0948   ALKPHOS 115 10/09/2014 1138   ALKPHOS 140 09/25/2014 0948   BILITOT 0.5 10/09/2014 1138   BILITOT 0.34 09/25/2014 0948   GFRNONAA >60 10/09/2014 1138   GFRAA >60 10/09/2014 1138    No results found for: SPEP, UPEP  Lab Results  Component  Value Date   WBC 10.6* 10/09/2014   NEUTROABS 3.4 09/25/2014   HGB 11.0* 10/09/2014   HCT 34.6* 10/09/2014   MCV 92.3 10/09/2014   PLT 277 10/09/2014      Chemistry      Component Value Date/Time   NA 136 10/09/2014 1138   NA 139 09/25/2014 0948   K 4.3 10/09/2014 1138   K 4.7 09/25/2014 0948   CL 98* 10/09/2014 1138   CO2 30 10/09/2014 1138   CO2 30* 09/25/2014 0948   BUN 9 10/09/2014 1138   BUN 7.9 09/25/2014 0948   CREATININE 0.82 10/09/2014 1138   CREATININE 0.9 09/25/2014 0948   CREATININE 1.29 01/11/2014 0907      Component Value Date/Time   CALCIUM 9.0 10/09/2014 1138   CALCIUM 9.7 09/25/2014 0948   ALKPHOS 115 10/09/2014 1138   ALKPHOS 140 09/25/2014 0948   AST 23 10/09/2014 1138   AST 34 09/25/2014 0948   ALT 15* 10/09/2014 1138   ALT 25 09/25/2014 0948   BILITOT 0.5 10/09/2014 1138   BILITOT 0.34 09/25/2014 0948       RADIOGRAPHIC STUDIES: I have personally reviewed the radiological images as listed and agreed with the findings in the report. Dg Chest 2 View  10/09/2014   CLINICAL DATA:  Productive cough.  EXAM: CHEST  2 VIEW  COMPARISON:  August 06, 2014.  FINDINGS: The heart size and mediastinal contours are within normal limits. Both lungs are clear. Sternotomy wires are noted. No pneumothorax or pleural effusion is noted. Right internal jugular Port-A-Cath is noted with distal tip overlying expected position of the SVC. The visualized skeletal structures are unremarkable.  IMPRESSION: No active cardiopulmonary disease.   Electronically Signed   By: Marijo Conception, M.D.   On: 10/09/2014 13:18     ASSESSMENT & PLAN:  Squamous cell carcinoma of larynx He has progressive decline in weight and performance status. I recommend the patient to stay to receive IV fluids daily for hydration and IV pain management. He agreed to proceed. I will continue to see him on a weekly basis to perform supportive care.  Infection, respiratory tract He has evidence of upper  respiratory tract infection with productive greenish sputum. He has fever and leukocytosis. He also has open sores on the skin around his neck. I will proceed to treat him with Augmentin for 7 days     Protein calorie malnutrition He has progressive weight loss and is quite stubborn and resistant to help. After much persuasion, he is in agreement to come in daily for IV fluid support and he would try to need as  much as possible by mouth. I will get dietitian to follow him. Hopefully, with IV fluid support and IV pain medicine, he is able to eat better and prevent dehydration  Throat pain in adult He has progressive, worsening pain. We will maintain fentanyl patch 100 g. I would increase ir morphine to 30 mg every 4 hours as needed along with pain medicine intravenously daily.  Skin sore He has evidence of skin ulceration from radiation. He is using topical cream. I would try to support him with IV fluids and get nutritionists to support him nutrition wise. I will start him on antibiotics. Will reassess next week.   No orders of the defined types were placed in this encounter.   All questions were answered. The patient knows to call the clinic with any problems, questions or concerns. No barriers to learning was detected. I spent 30 minutes counseling the patient face to face. The total time spent in the appointment was 40 minutes and more than 50% was on counseling and review of test results     [facility], [place], MD 10/10/2014 1:22 PM

## 2014-10-10 NOTE — Assessment & Plan Note (Signed)
He has progressive weight loss and is quite stubborn and resistant to help. After much persuasion, he is in agreement to come in daily for IV fluid support and he would try to need as much as possible by mouth. I will get dietitian to follow him. Hopefully, with IV fluid support and IV pain medicine, he is able to eat better and prevent dehydration

## 2014-10-10 NOTE — Telephone Encounter (Signed)
Per staff message and POF I have scheduled appts. Advised scheduler of appts and to move some dates to sickle cell. JMW

## 2014-10-10 NOTE — Assessment & Plan Note (Addendum)
He has evidence of upper respiratory tract infection with productive greenish sputum. He has fever and leukocytosis. He also has open sores on the skin around his neck. I will proceed to treat him with Augmentin for 7 days

## 2014-10-10 NOTE — Telephone Encounter (Signed)
per pof to sch pt appt-sent MW email to sch IVF-pt to get updated sch after radaition trmt 9/28

## 2014-10-10 NOTE — Assessment & Plan Note (Signed)
He has evidence of skin ulceration from radiation. He is using topical cream. I would try to support him with IV fluids and get nutritionists to support him nutrition wise. I will start him on antibiotics. Will reassess next week.

## 2014-10-10 NOTE — Progress Notes (Signed)
BP 102/65 mmHg  Pulse 97  Temp(Src) 98.3 F (36.8 C)  Wt 146 lb 14.4 oz (66.633 kg)  SpO2 94%  Wt Readings from Last 3 Encounters:  10/10/14 146 lb 14.4 oz (66.633 kg)  10/09/14 148 lb 3.2 oz (67.223 kg)  10/03/14 152 lb 1.6 oz (68.992 kg)   For assessment prior to treatment as patient has excessive secretions  and difficulty swallowing..Suction equipment ordered through [facility].Given oral care sheet.Skin improved with use of saline soaked gauze application.

## 2014-10-10 NOTE — Progress Notes (Signed)
Weekly Management Note Current Dose: 54 Gy  Projected Dose:70 Gy   Narrative:  The patient presents for routine under treatment assessment.  CBCT/MVCT images/Port film x-rays were reviewed.  The chart was checked. The patient has difficulty swallowing and eating. He reports excessive oral secretions and extreme throat pain. Suction equipment ordered through [facility]. He was given an oral care sheet. Skin improved with use of saline soaked gauze application. His caretaker reports he is not eating much.  Physical Findings:  Moist desquamation of the anterior surface of his neck. No signs of infection. He is down 8 lbs since 2 weeks ago. Presents to the clinic with a nasal cannula in place with 2L of oxygen and in a wheelchair.  Vitals:  Filed Vitals:   10/10/14 1103  BP: 102/65  Pulse: 97  Temp:    Weight:  Wt Readings from Last 3 Encounters:  10/10/14 146 lb 14.4 oz (66.633 kg)  10/09/14 148 lb 3.2 oz (67.223 kg)  10/03/14 152 lb 1.6 oz (68.992 kg)   Lab Results  Component Value Date   WBC 10.6* 10/09/2014   HGB 11.0* 10/09/2014   HCT 34.6* 10/09/2014   MCV 92.3 10/09/2014   PLT 277 10/09/2014   Lab Results  Component Value Date   CREATININE 0.82 10/09/2014   BUN 9 10/09/2014   NA 136 10/09/2014   K 4.3 10/09/2014   CL 98* 10/09/2014   CO2 30 10/09/2014    Impression:  The patient is tolerating radiation.  Plan:  Continue treatment as planned. Continue pain medications and he is scheduled today with Dr. Alvy Bimler to discuss a higher dose.  I advised the patient and his family to continue applying biafine to the treatment area outside the area of moist desquamation. I advised the patient to create a baking soda mouthwash and use it for his secretions as well as the oral suction.  This document serves as a record of services personally performed by [name], MD. It was created on her behalf by Darcus Austin, a trained medical scribe. The creation of this record is  based on the scribe's personal observations and the provider's statements to them. This document has been checked and approved by the attending provider.

## 2014-10-10 NOTE — Patient Instructions (Signed)

## 2014-10-11 ENCOUNTER — Inpatient Hospital Stay (HOSPITAL_COMMUNITY): Payer: Medicaid Other

## 2014-10-11 ENCOUNTER — Inpatient Hospital Stay (HOSPITAL_COMMUNITY)
Admission: AD | Admit: 2014-10-11 | Discharge: 2014-10-20 | DRG: 177 | Disposition: A | Discharge status: Home Health | Payer: Medicaid Other | Source: Ambulatory Visit | Attending: Internal Medicine | Admitting: Internal Medicine

## 2014-10-11 ENCOUNTER — Ambulatory Visit
Admission: RE | Admit: 2014-10-11 | Discharge: 2014-10-11 | Disposition: A | Discharge status: Home / Self Care | Payer: Medicaid Other | Source: Ambulatory Visit | Attending: Radiation Oncology | Admitting: Radiation Oncology

## 2014-10-11 ENCOUNTER — Ambulatory Visit (HOSPITAL_BASED_OUTPATIENT_CLINIC_OR_DEPARTMENT_OTHER): Payer: Medicaid Other

## 2014-10-11 ENCOUNTER — Encounter (HOSPITAL_COMMUNITY): Payer: Self-pay

## 2014-10-11 ENCOUNTER — Ambulatory Visit (HOSPITAL_BASED_OUTPATIENT_CLINIC_OR_DEPARTMENT_OTHER): Payer: Medicaid Other | Admitting: Hematology and Oncology

## 2014-10-11 VITALS — BP 103/61 | HR 82 | Temp 97.8°F

## 2014-10-11 VITALS — BP 103/80 | HR 85 | Temp 97.8°F | Resp 20

## 2014-10-11 DIAGNOSIS — J988 Other specified respiratory disorders: Secondary | ICD-10-CM

## 2014-10-11 DIAGNOSIS — Z9221 Personal history of antineoplastic chemotherapy: Secondary | ICD-10-CM | POA: Diagnosis not present

## 2014-10-11 DIAGNOSIS — Z809 Family history of malignant neoplasm, unspecified: Secondary | ICD-10-CM

## 2014-10-11 DIAGNOSIS — R070 Pain in throat: Secondary | ICD-10-CM

## 2014-10-11 DIAGNOSIS — T17908A Unspecified foreign body in respiratory tract, part unspecified causing other injury, initial encounter: Secondary | ICD-10-CM | POA: Diagnosis not present

## 2014-10-11 DIAGNOSIS — L98499 Non-pressure chronic ulcer of skin of other sites with unspecified severity: Secondary | ICD-10-CM | POA: Insufficient documentation

## 2014-10-11 DIAGNOSIS — Z888 Allergy status to other drugs, medicaments and biological substances status: Secondary | ICD-10-CM | POA: Diagnosis not present

## 2014-10-11 DIAGNOSIS — D72829 Elevated white blood cell count, unspecified: Secondary | ICD-10-CM | POA: Diagnosis present

## 2014-10-11 DIAGNOSIS — J690 Pneumonitis due to inhalation of food and vomit: Principal | ICD-10-CM | POA: Diagnosis present

## 2014-10-11 DIAGNOSIS — C329 Malignant neoplasm of larynx, unspecified: Secondary | ICD-10-CM

## 2014-10-11 DIAGNOSIS — R059 Cough, unspecified: Secondary | ICD-10-CM

## 2014-10-11 DIAGNOSIS — C321 Malignant neoplasm of supraglottis: Secondary | ICD-10-CM

## 2014-10-11 DIAGNOSIS — E876 Hypokalemia: Secondary | ICD-10-CM | POA: Diagnosis present

## 2014-10-11 DIAGNOSIS — R1310 Dysphagia, unspecified: Secondary | ICD-10-CM

## 2014-10-11 DIAGNOSIS — I6521 Occlusion and stenosis of right carotid artery: Secondary | ICD-10-CM | POA: Diagnosis present

## 2014-10-11 DIAGNOSIS — Z9049 Acquired absence of other specified parts of digestive tract: Secondary | ICD-10-CM

## 2014-10-11 DIAGNOSIS — I5042 Chronic combined systolic (congestive) and diastolic (congestive) heart failure: Secondary | ICD-10-CM | POA: Diagnosis present

## 2014-10-11 DIAGNOSIS — L598 Other specified disorders of the skin and subcutaneous tissue related to radiation: Secondary | ICD-10-CM | POA: Diagnosis present

## 2014-10-11 DIAGNOSIS — R1319 Other dysphagia: Secondary | ICD-10-CM | POA: Diagnosis present

## 2014-10-11 DIAGNOSIS — E785 Hyperlipidemia, unspecified: Secondary | ICD-10-CM | POA: Diagnosis present

## 2014-10-11 DIAGNOSIS — I701 Atherosclerosis of renal artery: Secondary | ICD-10-CM | POA: Diagnosis present

## 2014-10-11 DIAGNOSIS — Z7189 Other specified counseling: Secondary | ICD-10-CM | POA: Diagnosis not present

## 2014-10-11 DIAGNOSIS — R4702 Dysphasia: Secondary | ICD-10-CM | POA: Diagnosis present

## 2014-10-11 DIAGNOSIS — R627 Adult failure to thrive: Secondary | ICD-10-CM | POA: Diagnosis present

## 2014-10-11 DIAGNOSIS — I255 Ischemic cardiomyopathy: Secondary | ICD-10-CM | POA: Diagnosis present

## 2014-10-11 DIAGNOSIS — L988 Other specified disorders of the skin and subcutaneous tissue: Secondary | ICD-10-CM | POA: Diagnosis not present

## 2014-10-11 DIAGNOSIS — K117 Disturbances of salivary secretion: Secondary | ICD-10-CM | POA: Diagnosis not present

## 2014-10-11 DIAGNOSIS — Y842 Radiological procedure and radiotherapy as the cause of abnormal reaction of the patient, or of later complication, without mention of misadventure at the time of the procedure: Secondary | ICD-10-CM | POA: Diagnosis present

## 2014-10-11 DIAGNOSIS — Z933 Colostomy status: Secondary | ICD-10-CM

## 2014-10-11 DIAGNOSIS — Z8249 Family history of ischemic heart disease and other diseases of the circulatory system: Secondary | ICD-10-CM

## 2014-10-11 DIAGNOSIS — E43 Unspecified severe protein-calorie malnutrition: Secondary | ICD-10-CM | POA: Diagnosis not present

## 2014-10-11 DIAGNOSIS — Z951 Presence of aortocoronary bypass graft: Secondary | ICD-10-CM

## 2014-10-11 DIAGNOSIS — I252 Old myocardial infarction: Secondary | ICD-10-CM

## 2014-10-11 DIAGNOSIS — T17908S Unspecified foreign body in respiratory tract, part unspecified causing other injury, sequela: Secondary | ICD-10-CM

## 2014-10-11 DIAGNOSIS — Z7982 Long term (current) use of aspirin: Secondary | ICD-10-CM

## 2014-10-11 DIAGNOSIS — Z6822 Body mass index (BMI) 22.0-22.9, adult: Secondary | ICD-10-CM

## 2014-10-11 DIAGNOSIS — K219 Gastro-esophageal reflux disease without esophagitis: Secondary | ICD-10-CM | POA: Diagnosis present

## 2014-10-11 DIAGNOSIS — Z9861 Coronary angioplasty status: Secondary | ICD-10-CM | POA: Diagnosis not present

## 2014-10-11 DIAGNOSIS — K1230 Oral mucositis (ulcerative), unspecified: Secondary | ICD-10-CM | POA: Diagnosis present

## 2014-10-11 DIAGNOSIS — Z823 Family history of stroke: Secondary | ICD-10-CM | POA: Diagnosis not present

## 2014-10-11 DIAGNOSIS — J069 Acute upper respiratory infection, unspecified: Secondary | ICD-10-CM | POA: Diagnosis not present

## 2014-10-11 DIAGNOSIS — R509 Fever, unspecified: Secondary | ICD-10-CM | POA: Diagnosis present

## 2014-10-11 DIAGNOSIS — E46 Unspecified protein-calorie malnutrition: Secondary | ICD-10-CM

## 2014-10-11 DIAGNOSIS — I110 Hypertensive heart disease with heart failure: Secondary | ICD-10-CM | POA: Diagnosis present

## 2014-10-11 DIAGNOSIS — Z79891 Long term (current) use of opiate analgesic: Secondary | ICD-10-CM

## 2014-10-11 DIAGNOSIS — Z79899 Other long term (current) drug therapy: Secondary | ICD-10-CM | POA: Diagnosis not present

## 2014-10-11 DIAGNOSIS — K5780 Diverticulitis of intestine, part unspecified, with perforation and abscess without bleeding: Secondary | ICD-10-CM | POA: Diagnosis present

## 2014-10-11 DIAGNOSIS — R05 Cough: Secondary | ICD-10-CM

## 2014-10-11 DIAGNOSIS — T17908D Unspecified foreign body in respiratory tract, part unspecified causing other injury, subsequent encounter: Secondary | ICD-10-CM | POA: Diagnosis not present

## 2014-10-11 DIAGNOSIS — I2510 Atherosclerotic heart disease of native coronary artery without angina pectoris: Secondary | ICD-10-CM | POA: Diagnosis present

## 2014-10-11 DIAGNOSIS — F419 Anxiety disorder, unspecified: Secondary | ICD-10-CM | POA: Diagnosis present

## 2014-10-11 DIAGNOSIS — Z66 Do not resuscitate: Secondary | ICD-10-CM | POA: Diagnosis present

## 2014-10-11 DIAGNOSIS — Z8674 Personal history of sudden cardiac arrest: Secondary | ICD-10-CM | POA: Diagnosis not present

## 2014-10-11 DIAGNOSIS — G893 Neoplasm related pain (acute) (chronic): Secondary | ICD-10-CM | POA: Diagnosis present

## 2014-10-11 DIAGNOSIS — D638 Anemia in other chronic diseases classified elsewhere: Secondary | ICD-10-CM | POA: Diagnosis present

## 2014-10-11 DIAGNOSIS — Z510 Encounter for antineoplastic radiation therapy: Secondary | ICD-10-CM | POA: Diagnosis present

## 2014-10-11 DIAGNOSIS — L089 Local infection of the skin and subcutaneous tissue, unspecified: Secondary | ICD-10-CM | POA: Diagnosis not present

## 2014-10-11 DIAGNOSIS — Z01810 Encounter for preprocedural cardiovascular examination: Secondary | ICD-10-CM

## 2014-10-11 DIAGNOSIS — Z515 Encounter for palliative care: Secondary | ICD-10-CM

## 2014-10-11 LAB — CBC
HEMATOCRIT: 28.6 % — AB (ref 39.0–52.0)
HEMOGLOBIN: 9.2 g/dL — AB (ref 13.0–17.0)
MCH: 29.9 pg (ref 26.0–34.0)
MCHC: 32.2 g/dL (ref 30.0–36.0)
MCV: 92.9 fL (ref 78.0–100.0)
Platelets: 273 10*3/uL (ref 150–400)
RBC: 3.08 MIL/uL — ABNORMAL LOW (ref 4.22–5.81)
RDW: 15.2 % (ref 11.5–15.5)
WBC: 7.7 10*3/uL (ref 4.0–10.5)

## 2014-10-11 LAB — BASIC METABOLIC PANEL
ANION GAP: 4 — AB (ref 5–15)
BUN: 8 mg/dL (ref 6–20)
CHLORIDE: 104 mmol/L (ref 101–111)
CO2: 31 mmol/L (ref 22–32)
Calcium: 8.2 mg/dL — ABNORMAL LOW (ref 8.9–10.3)
Creatinine, Ser: 0.62 mg/dL (ref 0.61–1.24)
GFR calc Af Amer: >60 mL/min (ref >60)
Glucose, Bld: 99 mg/dL (ref 65–99)
POTASSIUM: 3.7 mmol/L (ref 3.5–5.1)
SODIUM: 139 mmol/L (ref 135–145)

## 2014-10-11 MED ORDER — SODIUM CHLORIDE 0.9 % IV SOLN
1000.0000 mL | Freq: Once | INTRAVENOUS | Status: AC
  Administered 2014-10-11: 1000 mL via INTRAVENOUS

## 2014-10-11 MED ORDER — HYDROMORPHONE HCL 4 MG/ML IJ SOLN
INTRAMUSCULAR | Status: AC
  Filled 2014-10-11: qty 1

## 2014-10-11 MED ORDER — HYDROMORPHONE HCL 4 MG/ML IJ SOLN
4.0000 mg | Freq: Every day | INTRAMUSCULAR | Status: DC
  Administered 2014-10-11: 4 mg via INTRAVENOUS

## 2014-10-11 MED ORDER — DEXTROSE 5 % IV SOLN
500.0000 mg | INTRAVENOUS | Status: DC
  Administered 2014-10-11: 500 mg via INTRAVENOUS
  Filled 2014-10-11: qty 500

## 2014-10-11 MED ORDER — METOPROLOL TARTRATE 1 MG/ML IV SOLN
2.5000 mg | Freq: Two times a day (BID) | INTRAVENOUS | Status: DC
  Administered 2014-10-11 – 2014-10-20 (×16): 2.5 mg via INTRAVENOUS
  Filled 2014-10-11 (×17): qty 5

## 2014-10-11 MED ORDER — LEVOFLOXACIN IN D5W 750 MG/150ML IV SOLN
750.0000 mg | INTRAVENOUS | Status: DC
  Administered 2014-10-11 – 2014-10-13 (×3): 750 mg via INTRAVENOUS
  Filled 2014-10-11 (×3): qty 150

## 2014-10-11 MED ORDER — ONDANSETRON HCL 4 MG PO TABS: 4.0000 mg | ORAL_TABLET | Freq: Four times a day (QID) | ORAL | Status: DC | PRN

## 2014-10-11 MED ORDER — ENOXAPARIN SODIUM 40 MG/0.4ML ~~LOC~~ SOLN
40.0000 mg | SUBCUTANEOUS | Status: DC
  Administered 2014-10-11 – 2014-10-19 (×7): 40 mg via SUBCUTANEOUS
  Filled 2014-10-11 (×7): qty 0.4

## 2014-10-11 MED ORDER — ONDANSETRON HCL 4 MG/2ML IJ SOLN
4.0000 mg | Freq: Four times a day (QID) | INTRAMUSCULAR | Status: DC | PRN
  Administered 2014-10-12 – 2014-10-13 (×2): 4 mg via INTRAVENOUS
  Filled 2014-10-11 (×2): qty 2

## 2014-10-11 MED ORDER — MORPHINE SULFATE (CONCENTRATE) 10 MG/0.5ML PO SOLN
20.0000 mg | ORAL | Status: DC | PRN
  Administered 2014-10-11 – 2014-10-20 (×29): 20 mg via ORAL
  Filled 2014-10-11 (×30): qty 1

## 2014-10-11 MED ORDER — VANCOMYCIN HCL IN DEXTROSE 750-5 MG/150ML-% IV SOLN
750.0000 mg | Freq: Three times a day (TID) | INTRAVENOUS | Status: DC
  Administered 2014-10-11 – 2014-10-14 (×8): 750 mg via INTRAVENOUS
  Filled 2014-10-11 (×9): qty 150

## 2014-10-11 MED ORDER — METRONIDAZOLE IN NACL 5-0.79 MG/ML-% IV SOLN
500.0000 mg | Freq: Three times a day (TID) | INTRAVENOUS | Status: DC
  Administered 2014-10-11 – 2014-10-14 (×8): 500 mg via INTRAVENOUS
  Filled 2014-10-11 (×8): qty 100

## 2014-10-11 MED ORDER — ACETAMINOPHEN 325 MG PO TABS: 650.0000 mg | ORAL_TABLET | Freq: Four times a day (QID) | ORAL | Status: DC | PRN

## 2014-10-11 MED ORDER — HYDROMORPHONE HCL 4 MG/ML IJ SOLN
2.0000 mg | Freq: Once | INTRAMUSCULAR | Status: AC
Start: 2014-10-11 — End: 2014-10-11
  Administered 2014-10-11: 2 mg via INTRAVENOUS

## 2014-10-11 MED ORDER — SODIUM CHLORIDE 0.9 % IJ SOLN
10.0000 mL | INTRAMUSCULAR | Status: DC | PRN
  Administered 2014-10-11: 10 mL
  Filled 2014-10-11: qty 10

## 2014-10-11 MED ORDER — PANTOPRAZOLE SODIUM 40 MG IV SOLR
40.0000 mg | INTRAVENOUS | Status: DC
  Administered 2014-10-11 – 2014-10-20 (×10): 40 mg via INTRAVENOUS
  Filled 2014-10-11 (×10): qty 40

## 2014-10-11 MED ORDER — ALBUTEROL SULFATE (2.5 MG/3ML) 0.083% IN NEBU: 2.5000 mg | INHALATION_SOLUTION | Freq: Four times a day (QID) | RESPIRATORY_TRACT | Status: DC | PRN

## 2014-10-11 MED ORDER — HEPARIN SOD (PORK) LOCK FLUSH 100 UNIT/ML IV SOLN
500.0000 [IU] | Freq: Once | INTRAVENOUS | Status: AC | PRN
  Administered 2014-10-11: 500 [IU]
  Filled 2014-10-11: qty 5

## 2014-10-11 MED ORDER — LORAZEPAM 2 MG/ML IJ SOLN
0.2500 mg | Freq: Three times a day (TID) | INTRAMUSCULAR | Status: DC
  Administered 2014-10-11 – 2014-10-20 (×29): 0.25 mg via INTRAVENOUS
  Filled 2014-10-11 (×28): qty 1

## 2014-10-11 MED ORDER — SODIUM CHLORIDE 0.9 % IV SOLN
Freq: Once | INTRAVENOUS | Status: AC
  Administered 2014-10-11: 13:00:00 via INTRAVENOUS
  Filled 2014-10-11: qty 4

## 2014-10-11 MED ORDER — FENTANYL 100 MCG/HR TD PT72: 100.0000 ug | MEDICATED_PATCH | TRANSDERMAL | Status: DC

## 2014-10-11 MED ORDER — DEXTROSE-NACL 5-0.9 % IV SOLN
INTRAVENOUS | Status: DC
  Administered 2014-10-11: 1000 mL via INTRAVENOUS
  Administered 2014-10-12 – 2014-10-13 (×4): via INTRAVENOUS

## 2014-10-11 MED ORDER — ACETAMINOPHEN 650 MG RE SUPP: 650.0000 mg | Freq: Four times a day (QID) | RECTAL | Status: DC | PRN

## 2014-10-11 MED ORDER — CETYLPYRIDINIUM CHLORIDE 0.05 % MT LIQD
7.0000 mL | Freq: Two times a day (BID) | OROMUCOSAL | Status: DC
  Administered 2014-10-11 – 2014-10-20 (×16): 7 mL via OROMUCOSAL

## 2014-10-11 NOTE — Assessment & Plan Note (Signed)
The patient is not able to tolerate any form of oral intake. The dietitian is concerned he may be at risk of aspiration. We will hold off oral feeding. He will need formal swallow assessment and possible feeding tube placement while hospitalized.

## 2014-10-11 NOTE — Progress Notes (Signed)
Patient transported via w/chair, came from the cancer center. Alert and oriented,forgetful at times. Placed comfortably in bed,.Vital signs obtained. Admitting MD made aware of patient's arrival to unit. Will continue to monitor.

## 2014-10-11 NOTE — Progress Notes (Signed)
Report called to [facility] by [facility]. Transported to 3w in wheelchair.

## 2014-10-11 NOTE — Assessment & Plan Note (Signed)
He has significant skin ulceration. Compared to yesterday's visit, there is now discharging wound around his neck. He would need IV antibiotics of wound care. I will send a message to his radiation oncologist to consider holding off further treatment to allow the wound to heal

## 2014-10-11 NOTE — Assessment & Plan Note (Signed)
He has progressive decline. He is now unable to swallow anything by mouth. The dietitian to observe his swallowing and raise concern that the patient may be at risk of aspiration. He is not able to take any of his pills. I recommend admission to the hospital for further workup and management.

## 2014-10-11 NOTE — Progress Notes (Signed)
[facility] OFFICE PROGRESS NOTE  Patient Care Team: [name], NP as PCP - General [name], RN as Oncology Nurse Navigator [name], MD as Consulting Physician (Radiation Oncology) [name], MD as Consulting Physician (Hematology and Oncology) [name], RD as Dietitian (Nutrition)  SUMMARY OF ONCOLOGIC HISTORY: Oncology History   Squamous cell carcinoma of larynx   Staging form: Larynx - Supraglottis, AJCC 7th Edition     Clinical stage from 07/20/2014: Stage III (T3, N0, M0) - Signed by [name], MD on 08/01/2014       Squamous cell carcinoma of larynx   06/22/2014 Imaging CT scan showed greater than 2 cm supraglottic squamous cell carcinoma of the larynx with contralateral extension. No regional pathologic adenopathy   07/12/2014 Pathology Results Accession: [id number] biopsy of larynx show squamous cell carcinoma   07/12/2014 Surgery Laryngoscopy revealed a large exophytic tumor arising from the left supraglottic larynx, aryepiglottic fold. It extended from the anterior commissure and the epiglottis anteriorly, to the arytenoid posteriorly. The entire aryepiglottic fold was involved   07/27/2014 Imaging Staging PET:  1. Intense metabolic activity associated with the left laryngeal mass.  2. Single mildly hypermetabolic small left level 2 lymph node.  No additional evidence of metastatic adenopathy in neck.  3. No evidence of thoracic metastasis.   07/28/2014 Procedure Port-a-cath placed.   08/02/2014 - 08/04/2014 Hospital Admission Admitted to ICU following allergic rxt to Cetuximab resulting in cardiac arrest   08/02/2014 Adverse Reaction The patient received a small amount of cetuximab resulting in cardiac arrest. The medication was permanently discontinued   08/04/2014 Imaging CT scan of abdomen showed neumoperitoneum. 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     INTERVAL HISTORY: Please see below for problem oriented charting. The patient is seen urgently at the infusion area. According to the wife, he has progressive decline. He is now unable to swallow anything by mouth. The dietitian is concern about possible aspiration. The skin ulceration around his neck is worse and is not discharging pus. There were no recent fevers or chills.  REVIEW OF SYSTEMS:   Constitutional: Denies fevers, chills or abnormal weight loss Eyes: Denies blurriness of vision Ears, nose, mouth, throat, and face: Denies mucositis or sore throat Respiratory: Denies cough, dyspnea or wheezes Cardiovascular: Denies palpitation, chest discomfort or lower extremity swelling Gastrointestinal:  Denies nausea, heartburn or change in bowel habits Lymphatics: Denies new lymphadenopathy or easy bruising Neurological:Denies numbness, tingling or new weaknesses Behavioral/Psych: Mood is stable, no new changes  All other systems were reviewed with the patient and are negative.  I have reviewed the past medical history, past surgical history, social history and family history with the patient and they are unchanged from previous note.  ALLERGIES:  is allergic to cetuximab.  MEDICATIONS:  Current Outpatient Prescriptions  Medication Sig Dispense Refill  . acetaminophen (TYLENOL) 500 MG tablet Take 1,000 mg by mouth every 6 (six) hours as needed for mild pain, moderate pain or headache.    . albuterol (PROVENTIL HFA;VENTOLIN HFA) 108 (90 BASE) MCG/ACT inhaler Inhale 2 puffs into the lungs every 6 (six) hours as needed for wheezing or shortness of breath. 1  Inhaler 0  . ALPRAZolam (XANAX) 0.5  MG tablet Take 0.5 mg by mouth 3 (three) times daily as needed for anxiety.     Marland Kitchen amoxicillin-clavulanate (AUGMENTIN) 875-125 MG tablet Take 1 tablet by mouth 2 (two) times daily. 14 tablet 0  . aspirin EC 81 MG tablet Take 81 mg by mouth daily.    Marland Kitchen buPROPion (WELLBUTRIN XL) 300 MG 24 hr tablet Take 300 mg by mouth daily.    . carvedilol (COREG) 12.5 MG tablet Take 0.5 tablets (6.25 mg total) by mouth 2 (two) times daily. 60 tablet 0  . fentaNYL (DURAGESIC - DOSED MCG/HR) 100 MCG/HR Place 1 patch (100 mcg total) onto the skin every 3 (three) days. 5 patch 0  . lidocaine-prilocaine (EMLA) cream Apply 1 application topically as directed. Apply to affected area once 30 g 3  . morphine (MSIR) 30 MG tablet Take 1 tablet (30 mg total) by mouth every 4 (four) hours as needed for severe pain. 90 tablet 0  . Morphine Sulfate (MORPHINE CONCENTRATE) 10 mg / 0.5 ml concentrated solution Take 1 mL (20 mg total) by mouth every 2 (two) hours as needed for severe pain. 240 mL 0  . pantoprazole (PROTONIX) 40 MG tablet TAKE 1 TABLET BY MOUTH EVERY DAY (Patient taking differently: TAKE 40 MG BY MOUTH DAILY) 90 tablet 3  . potassium chloride SA (K-DUR,KLOR-CON) 20 MEQ tablet TAKE 20 MEQ BY MOUTH ONCE DAILY  1  . prochlorperazine (COMPAZINE) 10 MG tablet Take 1 tablet (10 mg total) by mouth every 6 (six) hours as needed for nausea or vomiting. 60 tablet 1   No current facility-administered medications for this visit.   Facility-Administered Medications Ordered in Other Visits  Medication Dose Route Frequency Emlyn Maves Last Rate Last Dose  . HYDROmorphone (DILAUDID) injection 4 mg  4 mg Intravenous Daily [name], MD   4 mg at 10/11/14 1215  . sodium chloride 0.9 % injection 10 mL  10 mL Intracatheter PRN [name], MD   10 mL at 10/11/14 1415    PHYSICAL EXAMINATION: ECOG PERFORMANCE STATUS: 2 - Symptomatic, <50% confined to bed  Filed Vitals:   10/11/14 1100  BP: 103/61  Pulse: 82  Temp: 97.8 F (36.6 C)    There were no vitals filed for this visit.  GENERAL:alert, no distress and comfortable SKIN: Worsening skin rash. There is pus around ulcerated sore on his neck. EYES: normal, Conjunctiva are pink and non-injected, sclera clear OROPHARYNX:no exudate, no erythema and lips, buccal mucosa, and tongue normal  NECK: supple, thyroid normal size, non-tender, without nodularity LYMPH:  no palpable lymphadenopathy in the cervical, axillary or inguinal LUNGS: clear to auscultation and percussion with normal breathing effort HEART: regular rate & rhythm and no murmurs and no lower extremity edema ABDOMEN:abdomen soft, non-tender and normal bowel sounds. Stoma appears to be functioning well. Musculoskeletal:no cyanosis of digits and no clubbing  NEURO: alert & oriented x 3 with fluent speech, no focal motor/sensory deficits  LABORATORY DATA:  I have reviewed the data as listed    Component Value Date/Time   NA 136 10/09/2014 1138   NA 139 09/25/2014 0948   K 4.3 10/09/2014 1138   K 4.7 09/25/2014 0948   CL 98* 10/09/2014 1138   CO2 30 10/09/2014 1138   CO2 30* 09/25/2014 0948   GLUCOSE 118* 10/09/2014 1138   GLUCOSE 98 09/25/2014 0948   BUN 9 10/09/2014 1138   BUN 7.9 09/25/2014 0948   CREATININE 0.82 10/09/2014 1138  CREATININE 0.9 09/25/2014 0948   CREATININE 1.29 01/11/2014 0907   CALCIUM 9.0 10/09/2014 1138   CALCIUM 9.7 09/25/2014 0948   PROT 6.9 10/09/2014 1138   PROT 6.9 09/25/2014 0948   ALBUMIN 3.4* 10/09/2014 1138   ALBUMIN 3.3* 09/25/2014 0948   AST 23 10/09/2014 1138   AST 34 09/25/2014 0948   ALT 15* 10/09/2014 1138   ALT 25 09/25/2014 0948   ALKPHOS 115 10/09/2014 1138   ALKPHOS 140 09/25/2014 0948   BILITOT 0.5 10/09/2014 1138   BILITOT 0.34 09/25/2014 0948   GFRNONAA >60 10/09/2014 1138   GFRAA >60 10/09/2014 1138    No results found for: SPEP, UPEP  Lab Results  Component Value Date   WBC 10.6* 10/09/2014   NEUTROABS 3.4 09/25/2014   HGB 11.0*  10/09/2014   HCT 34.6* 10/09/2014   MCV 92.3 10/09/2014   PLT 277 10/09/2014      Chemistry      Component Value Date/Time   NA 136 10/09/2014 1138   NA 139 09/25/2014 0948   K 4.3 10/09/2014 1138   K 4.7 09/25/2014 0948   CL 98* 10/09/2014 1138   CO2 30 10/09/2014 1138   CO2 30* 09/25/2014 0948   BUN 9 10/09/2014 1138   BUN 7.9 09/25/2014 0948   CREATININE 0.82 10/09/2014 1138   CREATININE 0.9 09/25/2014 0948   CREATININE 1.29 01/11/2014 0907      Component Value Date/Time   CALCIUM 9.0 10/09/2014 1138   CALCIUM 9.7 09/25/2014 0948   ALKPHOS 115 10/09/2014 1138   ALKPHOS 140 09/25/2014 0948   AST 23 10/09/2014 1138   AST 34 09/25/2014 0948   ALT 15* 10/09/2014 1138   ALT 25 09/25/2014 0948   BILITOT 0.5 10/09/2014 1138   BILITOT 0.34 09/25/2014 0948     ASSESSMENT & PLAN:  Squamous cell carcinoma of larynx He has progressive decline. He is now unable to swallow anything by mouth. The dietitian to observe his swallowing and raise concern that the patient may be at risk of aspiration. He is not able to take any of his pills. I recommend admission to the hospital for further workup and management.  Infection, respiratory tract The patient had unresolved upper respiratory tract infection and now with possible skin infection. Due to inability to swallow antibiotics, I believe he would need IV antibiotics. I have discussed with the hospitalist with the plan for IV antibiotics   Skin ulceration He has significant skin ulceration. Compared to yesterday's visit, there is now discharging wound around his neck. He would need IV antibiotics of wound care. I will send a message to his radiation oncologist to consider holding off further treatment to allow the wound to heal  Protein calorie malnutrition The patient is not able to tolerate any form of oral intake. The dietitian is concerned he may be at risk of aspiration. We will hold off oral feeding. He will need formal  swallow assessment and possible feeding tube placement while hospitalized.   No orders of the defined types were placed in this encounter.   All questions were answered. The patient knows to call the clinic with any problems, questions or concerns. No barriers to learning was detected. I spent 30 minutes counseling the patient face to face. The total time spent in the appointment was 40 minutes and more than 50% was on counseling and review of test results     [facility], [place], MD 10/11/2014 2:36 PM

## 2014-10-11 NOTE — H&P (Addendum)
[facility] History and Physical  [name] GBT:[id number] DOB: 06-Dec-1955 DOA: 10/11/2014   PCP: Imelda Pillow, NP    Chief Complaint: Fever-inability to swallow  HPI: [name] is a 59 y.o. 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 The patient has been receiving radiation to his neck and he is towards the end of his course of radiation. He has received chemotherapy in the past however developed anaphylaxis from it and therefore chemotherapy was discontinued. Of late the patient has been having increasing dysphagia. This was initially to solids and then to liquids. It is gotten to a point where he states that when he tries to swallow liquid, it comes up right away. He has some pain in his throat but this is not severe.  He began to have fevers 2 days ago reaching as high as 101. He was placed on Augmentin yesterday by his oncologist however is unable to swallow pills. He received IV fluids at the oncology office yesterday and was there today to receive more IV fluids but it was decided that he should be admitted to the hospital for ongoing fevers and inability to take oral antibiotics. He is noted to have severe scabbing of his neck secondary to radiation. There are areas where the scabs have come off and the skin is red and raw and has a yellowish discharge. According to the patient and wife the yellowish discharge started a couple of days ago. In addition he admits to coughing up yellow mucus which started about 3-4 days ago. He has had no shortness of breath on exertion and no chest pain. He has no complaints of dysuria, abdominal pain and no increase in stool output in colostomy.    General: + anorexia, fever, weight loss Cardiac: Denies chest pain, syncope, palpitations, pedal edema  Respiratory:  +cough, no shortness of breath or wheezing GI: Denies severe indigestion/heartburn, abdominal pain, nausea, vomiting,+ dysphagia as mentioned above, having normal stool in his colostomy GU: Denies hematuria, incontinence, dysuria  Musculoskeletal: Denies arthritis  Skin: Denies suspicious skin lesions Neurologic: Denies focal weakness or numbness, change in vision Psychiatry: Denies depression  + anxiety. Hematologic: no easy bruising or bleeding  All other systems reviewed and found to be negative.  Past Medical History  Diagnosis Date  . Carotid stenosis   . Coronary artery disease     s/p CABG 2006; stenting to vein graft 05-2013; NSTEMI 06-2013 in setting of PEA arrest  . Subclavian artery stenosis, left     s/p stenting 2006  . Hypertension   . Hyperlipidemia   . Ischemic cardiomyopathy     echo 06/2013 EF 10-15% (normal 01/2013)  . At risk for sudden cardiac death, has lifevest at discharge 06/20/2013  . Shock liver 06/20/2013  . Acute MI, troponin > 20, no obvious culprit vessel 06/20/2013  . Hypothermia, induced, post arrest 06/20/2013  . Acute encephalopathy, improved 06/20/2013  . Dizziness 06/20/2013  . Weakness due to cardiac arrest 06/20/2013  . Fever, possible aspiration-treated with antibiotics 06/20/2013  . Tobacco abuse     >100 pack year history   . Dyslipidemia 10/09/2013    [place] Study atorvastatin -eIRB # [id number] tablet Take 40 mg by mouth daily.   Marland Kitchen PAD (peripheral artery disease) 10/09/2013    Occluded left common carotid and moderate right internal carotid artery stenosis. Previous stent to  the left subclavian artery. Right iliac stent. Bilateral 60-99% renal artery stenosis    . Heart failure, acute on chronic, systolic and diastolic 86/76/7209  . Anxiety   . GERD (gastroesophageal reflux disease)   . 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 Angioplasty  05/2013    stent to SVG - LAD vein graft at [facility]  . Coronary  artery bypass graft  09/2004    SVG to LAD (at [facility] [place])  . Nm myocar perf wall motion  06/2008    persantine myoview - normal pattern of systolic thickening/wall motion/perfusion;  . Lower extremity arterial doppler  08/2008    right and left ABI - mild arterial insufficiency at rest; R CIA/stent with 50-69% narrowing, L CIA with increased velocities (50-69% diameter reduction)  . Renal artery doppler  05/2008    right and left prox renal arteries with narrowing and increased velocities (60-99% diameter reduction)  . Iliac artery stent  07/24/2008    8x6 Smart nitinol self-expanding stent to origin of ilaci down into external iliac crossing the hypogastric artery (Dr. Adora Fridge)  . Subclavian angiogram Left 08/20/2004    8x67mm Genesis balloon mounted stent  . Left heart catheterization with coronary/graft angiogram N/A 06/15/2013    Procedure: LEFT HEART CATHETERIZATION WITH Beatrix Fetters;  Surgeon: Sinclair Grooms, MD;  Location: [facility] CATH LAB;  Service: Cardiovascular;  Laterality: N/A;  . Cardiac catheterization    . Direct laryngoscopy N/A 07/06/2014    Procedure: DIRECT LARYNGOSCOPY AND ESOPAGOSCOPY WITH BIOPSY;  Surgeon: [name], MD;  Location: [place];  Service: ENT;  Laterality: N/A;  . Laparotomy N/A 08/05/2014    Procedure: EXPLORATORY LAPAROTOMY WITH SIGMOIDOTOMY WITH COLECTOMY & COLOSTOMY;  Surgeon: Coralie Keens, MD;  Location: [place];  Service: General;  Laterality: N/A;    Social History: He states he does not smoke or drink alcohol Lives at home with wife    Allergies  Allergen Reactions  . Cetuximab Anaphylaxis    Cardiac arrest 08/02/2014    Family history:   Family History  Problem Relation Age of Onset  . Heart disease Maternal Aunt   . Heart attack Mother   . Stroke Mother   . Cancer Father   . Stroke Sister   . Heart Problems Sister       Prior to Admission medications   Medication Sig Start Date End Date Taking? Authorizing Provider  acetaminophen  (TYLENOL) 500 MG tablet Take 1,000 mg by mouth every 6 (six) hours as needed for mild pain, moderate pain or headache.    Historical Provider, MD  albuterol (PROVENTIL HFA;VENTOLIN HFA) 108 (90 BASE) MCG/ACT inhaler Inhale 2 puffs into the lungs every 6 (six) hours as needed for wheezing or shortness of breath. 08/12/14   Shanker Kristeen Mans, MD  ALPRAZolam Duanne Moron) 0.5 MG tablet Take 0.5 mg by mouth 3 (three) times daily as needed for anxiety.     Historical Provider, MD  amoxicillin-clavulanate (AUGMENTIN) 875-125 MG tablet Take 1 tablet by mouth 2 (two) times daily. 10/10/14   [name], MD  aspirin EC 81 MG tablet Take 81 mg by mouth daily.    Historical Provider, MD  buPROPion (WELLBUTRIN XL) 300 MG 24 hr tablet Take 300 mg by mouth daily.    Historical Provider, MD  carvedilol (COREG) 12.5 MG tablet Take 0.5 tablets (6.25 mg total) by mouth 2 (two) times daily. 08/12/14   Jonetta Osgood, MD  fentaNYL (DURAGESIC - DOSED MCG/HR) 100 MCG/HR Place 1 patch (100 mcg total) onto the skin every 3 (three) days. 10/03/14   [name], MD  lidocaine-prilocaine (EMLA) cream Apply 1 application topically as directed. Apply to affected area once 09/05/14   [name], MD  morphine (MSIR) 30 MG tablet Take 1 tablet (30 mg total) by mouth every 4 (four) hours as needed for severe pain. 10/10/14   [name], MD  Morphine Sulfate (MORPHINE CONCENTRATE) 10 mg / 0.5 ml concentrated solution Take 1 mL (20 mg total) by mouth every 2 (two) hours as needed for severe pain. 09/05/14   [name], MD  pantoprazole (PROTONIX) 40 MG tablet TAKE 1 TABLET BY MOUTH EVERY DAY Patient taking differently: TAKE 40 MG BY MOUTH DAILY 04/17/14   Mihai Croitoru, MD  potassium chloride SA (K-DUR,KLOR-CON) 20 MEQ tablet TAKE 20 MEQ BY MOUTH ONCE DAILY 06/08/14   Historical Provider, MD  prochlorperazine (COMPAZINE) 10 MG tablet Take 1 tablet (10 mg total) by mouth every 6 (six) hours as needed for nausea or vomiting. 10/06/14   [name],  MD     Physical Exam: Filed Vitals:   10/11/14 1520  BP: 101/54  Pulse: 86  Temp: 98 F (36.7 C)  TempSrc: Oral  Resp: 19  SpO2: 91%     General: Middle-aged man sitting up in bed in no acute distress HEENT: Normocephalic and Atraumatic, Mucous membranes pink                PERRLA; EOM intact; No scleral icterus,                 Nares: Patent, Oropharynx: Clear, Fair Dentition                 Neck: FROM, no cervical lymphadenopathy, thyromegaly, carotid bruit or JVD;  Breasts: deferred CHEST WALL: No tenderness  CHEST: Normal respiration, clear to auscultation bilaterally  HEART: Regular rate and rhythm; no murmurs rubs or gallops  BACK: No kyphosis or scoliosis; no CVA tenderness  GI: Positive Bowel Sounds, soft, non-tender; no masses, no organomegaly Rectal Exam: deferred MSK: No cyanosis, clubbing, or edema Genitalia: not examined  SKIN:  Extensive ulceration of the skin at the base of his neck with yellow discharge throughout this area CNS: Alert and Oriented x 4, Nonfocal exam, CN 2-12 intact  Labs on Admission:  Basic Metabolic Panel:  Recent Labs Lab 10/09/14 1138  NA 136  K 4.3  CL 98*  CO2 30  GLUCOSE 118*  BUN 9  CREATININE 0.82  CALCIUM 9.0   Liver Function Tests:  Recent Labs Lab 10/09/14 1138  AST 23  ALT 15*  ALKPHOS 115  BILITOT 0.5  PROT 6.9  ALBUMIN 3.4*   No results for input(s): LIPASE, AMYLASE in the last 168 hours. No results for input(s): AMMONIA in the last 168 hours. CBC:  Recent Labs Lab 10/09/14 1138  WBC 10.6*  HGB 11.0*  HCT 34.6*  MCV 92.3  PLT 277   Cardiac Enzymes: No results for input(s): CKTOTAL, CKMB, CKMBINDEX, TROPONINI in the last 168 hours.  BNP (last 3 results)  Recent Labs  08/05/14 0115  BNP 780.2*    ProBNP (last 3 results) No results for input(s): PROBNP in the last 8760 hours.  CBG:  Recent Labs Lab 10/09/14 1055  GLUCAP 104*    Radiological Exams on Admission: No results  found.   Assessment/Plan Principal Problem:   Fever in adult - possible cellulitis of  denuded areas of skin - Cough with yellow mucus-may have acute bronchitis vs pneumonia - has a port- will need to get blood cultures - obtain CBC to evaluate for leukocytosis Addendum- CXR reveals RML and RLL infiltrates- will start coverage for Aspiration PNA (due to dysphagia) and CAP with Levaquin and Flagyl- will also start Vanc in case he has a MRSA bacteremia or skin infection  Active Problems: Dysphagia - radiation induced pharyngitis? esophageal stricture?  - SLP eval, NPO, IVF- may need modified barium swallow or esophagram - check Bmet to evaluate for dehydration    CAD-hx of CABG '06, DES LCX 01/2013, DES SVG-LAD 5/21/5 at [facility] low dose B Blocker via IV- hold Coreg - last ECHO showed a normal EF and normal diastolic function    Squamous cell carcinoma of larynx - hold radiation for now    Cancer associated pain - cont Fentanyl patch and Roxanol  Anxiety - change Xanax to IV Ativan while NPO -We will need to hold Wellbutrin as he is unable to swallow pills  Colostomy -Secondary to perforated diverticulitis    Consulted:   Code Status: Full code Family Communication: wife at bedside  DVT Prophylaxis: Lovenox  Time spent: 43 min  [place], MD [facility]  If 7PM-7AM, please contact night-coverage [email or web address] 10/11/2014, 4:18 PM

## 2014-10-11 NOTE — Progress Notes (Signed)
ANTIBIOTIC CONSULT NOTE - INITIAL  Pharmacy Consult for vancomycin Indication: rule out sepsis  Allergies  Allergen Reactions  . Cetuximab Anaphylaxis    Cardiac arrest 08/02/2014    Patient Measurements: weight 66 kg, height 67 inches   Vital Signs: Temp: 98 F (36.7 C) (09/28 1520) Temp Source: Oral (09/28 1520) BP: 101/54 mmHg (09/28 1520) Pulse Rate: 86 (09/28 1520) Intake/Output from previous day:   Intake/Output from this shift:    Labs:  Recent Labs  10/09/14 1138 10/11/14 1612  WBC 10.6* 7.7  HGB 11.0* 9.2*  PLT 277 273  CREATININE 0.82  --    Estimated Creatinine Clearance: 88.4 mL/min (by C-G formula based on Cr of 0.82). No results for input(s): VANCOTROUGH, VANCOPEAK, VANCORANDOM, GENTTROUGH, GENTPEAK, GENTRANDOM, TOBRATROUGH, TOBRAPEAK, TOBRARND, AMIKACINPEAK, AMIKACINTROU, AMIKACIN in the last 72 hours.   Microbiology: No results found for this or any previous visit (from the past 720 hour(s)).  Medical History: Past Medical History  Diagnosis Date  . Carotid stenosis   . Coronary artery disease     s/p CABG 2006; stenting to vein graft 05-2013; NSTEMI 06-2013 in setting of PEA arrest  . Subclavian artery stenosis, left     s/p stenting 2006  . Hypertension   . Hyperlipidemia   . Ischemic cardiomyopathy     echo 06/2013 EF 10-15% (normal 01/2013)  . At risk for sudden cardiac death, has lifevest at discharge 06/20/2013  . Shock liver 06/20/2013  . Acute MI, troponin > 20, no obvious culprit vessel 06/20/2013  . Hypothermia, induced, post arrest 06/20/2013  . Acute encephalopathy, improved 06/20/2013  . Dizziness 06/20/2013  . Weakness due to cardiac arrest 06/20/2013  . Fever, possible aspiration-treated with antibiotics 06/20/2013  . Tobacco abuse     >100 pack year history   . Dyslipidemia 10/09/2013    [place] Study atorvastatin -eIRB # [id number] tablet Take 40 mg by mouth daily.   Marland Kitchen PAD (peripheral artery disease) 10/09/2013    Occluded left common carotid and  moderate right internal carotid artery stenosis. Previous stent to the left subclavian artery. Right iliac stent. Bilateral 60-99% renal artery stenosis    . Heart failure, acute on chronic, systolic and diastolic 72/53/6644  . Anxiety   . GERD (gastroesophageal reflux disease)   . 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  He presents to [facility] from the cancer center on 9/28 with c/o fever and dysphagia.  To start vancomycin for fever, suspected cellulitis, and bronchitis.  Goal of Therapy:  Vancomycin trough level 15-20 mcg/ml  Plan:  - vancomycin 750 mg IV q8h  Pham, Anh P 10/11/2014,4:38 PM

## 2014-10-11 NOTE — Assessment & Plan Note (Signed)
The patient had unresolved upper respiratory tract infection and now with possible skin infection. Due to inability to swallow antibiotics, I believe he would need IV antibiotics. I have discussed with the hospitalist with the plan for IV antibiotics

## 2014-10-12 ENCOUNTER — Inpatient Hospital Stay (HOSPITAL_COMMUNITY): Payer: Medicaid Other

## 2014-10-12 ENCOUNTER — Other Ambulatory Visit: Payer: Self-pay

## 2014-10-12 ENCOUNTER — Encounter: Payer: Self-pay | Admitting: *Deleted

## 2014-10-12 ENCOUNTER — Encounter: Payer: Self-pay | Admitting: Nutrition

## 2014-10-12 ENCOUNTER — Ambulatory Visit: Payer: Medicaid Other

## 2014-10-12 ENCOUNTER — Ambulatory Visit: Payer: Self-pay

## 2014-10-12 DIAGNOSIS — Z7189 Other specified counseling: Secondary | ICD-10-CM

## 2014-10-12 DIAGNOSIS — E46 Unspecified protein-calorie malnutrition: Secondary | ICD-10-CM

## 2014-10-12 DIAGNOSIS — Z515 Encounter for palliative care: Secondary | ICD-10-CM

## 2014-10-12 DIAGNOSIS — C321 Malignant neoplasm of supraglottis: Secondary | ICD-10-CM

## 2014-10-12 DIAGNOSIS — R1310 Dysphagia, unspecified: Secondary | ICD-10-CM

## 2014-10-12 DIAGNOSIS — L988 Other specified disorders of the skin and subcutaneous tissue: Secondary | ICD-10-CM

## 2014-10-12 DIAGNOSIS — K1230 Oral mucositis (ulcerative), unspecified: Secondary | ICD-10-CM

## 2014-10-12 DIAGNOSIS — C329 Malignant neoplasm of larynx, unspecified: Secondary | ICD-10-CM | POA: Insufficient documentation

## 2014-10-12 DIAGNOSIS — J069 Acute upper respiratory infection, unspecified: Secondary | ICD-10-CM

## 2014-10-12 DIAGNOSIS — D638 Anemia in other chronic diseases classified elsewhere: Secondary | ICD-10-CM

## 2014-10-12 DIAGNOSIS — R4182 Altered mental status, unspecified: Secondary | ICD-10-CM

## 2014-10-12 DIAGNOSIS — M542 Cervicalgia: Secondary | ICD-10-CM

## 2014-10-12 LAB — CBC
HEMATOCRIT: 27.9 % — AB (ref 39.0–52.0)
HEMOGLOBIN: 9.1 g/dL — AB (ref 13.0–17.0)
MCH: 30.1 pg (ref 26.0–34.0)
MCHC: 32.6 g/dL (ref 30.0–36.0)
MCV: 92.4 fL (ref 78.0–100.0)
Platelets: 270 10*3/uL (ref 150–400)
RBC: 3.02 MIL/uL — AB (ref 4.22–5.81)
RDW: 15.1 % (ref 11.5–15.5)
WBC: 4.9 10*3/uL (ref 4.0–10.5)

## 2014-10-12 LAB — BASIC METABOLIC PANEL
ANION GAP: 2 — AB (ref 5–15)
BUN: 6 mg/dL (ref 6–20)
CALCIUM: 7.9 mg/dL — AB (ref 8.9–10.3)
CHLORIDE: 105 mmol/L (ref 101–111)
CO2: 29 mmol/L (ref 22–32)
Creatinine, Ser: 0.73 mg/dL (ref 0.61–1.24)
GFR calc non Af Amer: >60 mL/min (ref >60)
Glucose, Bld: 140 mg/dL — ABNORMAL HIGH (ref 65–99)
POTASSIUM: 3.1 mmol/L — AB (ref 3.5–5.1)
Sodium: 136 mmol/L (ref 135–145)

## 2014-10-12 MED ORDER — SILVER SULFADIAZINE 1 % EX CREA
TOPICAL_CREAM | Freq: Every day | CUTANEOUS | Status: DC
  Administered 2014-10-13 – 2014-10-19 (×7): via TOPICAL
  Filled 2014-10-12 (×3): qty 50

## 2014-10-12 MED ORDER — FENTANYL 100 MCG/HR TD PT72
100.0000 ug | MEDICATED_PATCH | TRANSDERMAL | Status: DC
  Administered 2014-10-12 – 2014-10-18 (×3): 100 ug via TRANSDERMAL
  Filled 2014-10-12 (×3): qty 1

## 2014-10-12 MED ORDER — PIVOT 1.5 CAL PO LIQD
ORAL | Status: DC
  Administered 2014-10-12: 18:00:00
  Filled 2014-10-12 (×3): qty 1000

## 2014-10-12 MED ORDER — LORAZEPAM 2 MG/ML IJ SOLN
0.2500 mg | Freq: Once | INTRAMUSCULAR | Status: AC
  Filled 2014-10-12: qty 1

## 2014-10-12 MED ORDER — SILVER SULFADIAZINE 1 % EX CREA
TOPICAL_CREAM | Freq: Every day | CUTANEOUS | Status: DC
  Filled 2014-10-12: qty 85

## 2014-10-12 NOTE — Progress Notes (Signed)
Weekly Management Note Current Dose:58 Gy  Projected Dose:70 Gy   Narrative:  The patient presents for routine under treatment assessment.  CBCT/MVCT images/Port film x-rays were reviewed.  The chart was checked. Patient hospitalized yesterday for odynophagia and dysphagia.  Fever to 101 at home. Not able to swallow.  Brought down for treatment and I evaluated prior to RT. Inpatient notes reviewed and appreciate care. NG tube placed and tube feedings started. No Dophoff attempted.   Physical Findings:  Moist desquamation over neck continues.  No signs of infection. Patient is afebrile and low white count. Alert and oriented.   Vitals:  Filed Vitals:   10/09/14 1502  BP: 123/59  Pulse: 105  Temp:   Resp: 16   Weight:  Wt Readings from Last 3 Encounters:  10/12/14 146 lb (66.225 kg)  10/10/14 146 lb (66.225 kg)  10/10/14 146 lb 14.4 oz (66.633 kg)   Lab Results  Component Value Date   WBC 4.9 10/12/2014   HGB 9.1* 10/12/2014   HCT 27.9* 10/12/2014   MCV 92.4 10/12/2014   PLT 270 10/12/2014   Lab Results  Component Value Date   CREATININE 0.73 10/12/2014   BUN 6 10/12/2014   NA 136 10/12/2014   K 3.1* 10/12/2014   CL 105 10/12/2014   CO2 29 10/12/2014     Impression:  The patient has significant moist desquamation in the treatment field with diminished caloric intake and dehydration with inability to manage secretions.   Plan:  Continue treatment as planned.Discussed with RD. Would be ideal to get a Dopphoff tube in for long term feeding if NG is dislodged. Continue tube feeds. Saline gauze to neck BID with silvadene. Pain control. Secretions to suction as needed. Will improve as hydration and nutrition improves. Will hold RT today and tomorrow then re-evaluate on Monday.  He has not reached curative doses to his larynx cancer and I would recommend treatment continue next week to finish his course.  May need to remain hospitalized due to poor PO intake and wound care.    Appreciate inpatient support. Please call with questions.   [name]  [phone]

## 2014-10-12 NOTE — Progress Notes (Signed)
[facility] PROGRESS NOTE  [name] CHE:[id number] DOB: 01-09-1956 DOA: 10/11/2014 PCP: Imelda Pillow, NP  HPI:  [name] is a 59 y.o. 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 Has been receiving radiation to his neck, nearly finished with radiation course. He was intolerant to chemotherapy (anaphylaxis) so that was discontinued. Of late, the patient has been having increasing dysphagia progressing from solids now to liquids as well. He does complain of moderate throat pain, especially when talking or swallowing. Two days ago, he began having fevers up to 101 and a productive cough, was given augmentin by oncologist yesterday. Patient unable to swallow pills or tolerate PO intake so was admitted to the hospital.   Upon admission, chest x-ray reveals R side infiltrates, possibly d/t aspiration given severe dysphagia. Also of note, patient has severe scabbing and ulceration of his neck secondary to radiation therapy. There is now purulent discharge coming from wound bed and the area is causing patient significant discomfort. Patient denies CP, SOB, abdominal pain, or urinary complains.   Subjective: Patient is having severe pain associated with the wounds to his neck. Also having periods of confusion and disorientation, especially in the evenings. Denies CP, SOB, N/V.   Assessment/Plan: Probable Aspiration Pneumonia: Patient with several day hx of fevers (tmax 101), productive cough, and chest x-ray with R sided infiltrates. Blood cultures pending. Broad coverage IV abx have been started (levaquin, flagyl, and vancomycin). Patient is now afebrile and mild leukocytosis has resolved. Continue supportive care with IV fluids.   Acute Radiation Dermatitis: Patient nearly finished with radiation course for laryngeal cancer, presenting with severe scabbing and  ulceration of the neck secondary to this. Wound care has been consulted due to purlulent discharge coming from wound. Recommending twice daily and PRN cleansing of the tissue with NS and gently patting dry to soften and eventually remove the dried serum (scabbing). Wound exudate will be managed with absorption and provision of wound contact with an antimicrobial textile (Dermatherapy) The antibiotics for PNA should cover for any infection at this site as well, cultures pending. Continue pain management with fentanyl patch and roxanol as needed.   Dysphagia: Most likely secondary to vast progression of laryngeal cancer, but cannot rule out other causes at this time such as esophageal stricture or radiation induced pharyngitis. SLP eval has been ordered, and IR has been consulted to place a feeding tube. Patient has had virtually no PO intake for several days now, nutrition consulted to monitor tube feeding. Will monitor closely for refeeding syndrome. Monitor phos, mag, and BMP.  Squamous Cell Carcinoma of Larynx: Radiation oncology is following. Defer to Dr. Unknown Jim plan  Hypokalemia: Likely secondary to severe dysphagia and minimal PO intake the past week. Continue to replete with IVF and tube nutrition once placed, monitor electrolytes closely. Will provide additional potassium supplementation if no improvement after tube feedings initiated.    Anemia of Chronic Disease: Continue close monitoring of CBC, Hg slightly below baseline at this time but stable. No indication for transfusion at this time.   CAD: Hx of CABG '06, DES LCX 01/2013, DES SVG-LAD 5/21/5 at [facility]. Continue beta blocker. Recent echo showed normal EF and no diastolic dysfunction.   Hx of MI: Anterior wall NSTEMI last year. Stable.   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 Stable, no acute changes.    DVT Prophylaxis: Lovenox Code Status: Full Family Communication: None at bedise  Disposition Plan: Remain inpatient  Consultants:  Interventional Radiology  Oncology  Pulmonology  Procedures:  None  Antibiotics:  Levaquin 10/11/2014 >>   Flagyl 10/11/2014 >>  Vancomycin 10/11/2014 >>  Objective: Filed Vitals:   10/12/14 0658  BP: 120/55  Pulse: 88  Temp: 97.5 F (36.4 C)  Resp: 16    Intake/Output Summary (Last 24 hours) at 10/12/14 1255 Last data filed at 10/12/14 0544  Gross per 24 hour  Intake 1722.92 ml  Output    400 ml  Net 1322.92 ml   There were no vitals filed for this visit.  Exam:   General: Appears in moderate discomfort, cooperative  Neck: No JVD, no cervical LAD  Cardiovascular: RRR, no m/r/g  Respiratory: Crackles at right base, unlabored respiratory effort  Abdomen: Soft, non tender, no masses, +BS  Musculoskeletal: No LE edema, warm, full ROM in all 4  Neuro: Alert and oriented, non focal   Psych: Anxious   Skin: Extensive ulceration at base of neck with purulent discharge  Data Reviewed: Basic Metabolic Panel:  Recent Labs Lab 10/09/14 1138 10/11/14 1612 10/12/14 1140  NA 136 139 136  K 4.3 3.7 3.1*  CL 98* 104 105  CO2 30 31 29   GLUCOSE 118* 99 140*  BUN 9 8 6   CREATININE 0.82 0.62 0.73  CALCIUM 9.0 8.2* 7.9*   Liver Function Tests:  Recent Labs Lab 10/09/14 1138  AST 23  ALT 15*  ALKPHOS 115  BILITOT 0.5  PROT 6.9  ALBUMIN 3.4*   No results for input(s): LIPASE, AMYLASE in the last 168 hours. No results for input(s): AMMONIA in the last 168 hours. CBC:  Recent Labs Lab 10/09/14 1138 10/11/14 1612 10/12/14 1140  WBC 10.6* 7.7 4.9  HGB 11.0* 9.2* 9.1*  HCT 34.6* 28.6* 27.9*  MCV 92.3 92.9 92.4  PLT 277 273 270   Cardiac Enzymes: No results for input(s): CKTOTAL, CKMB, CKMBINDEX, TROPONINI in the last 168 hours. BNP (last 3 results)  Recent Labs  08/05/14 0115  BNP 780.2*    ProBNP  (last 3 results) No results for input(s): PROBNP in the last 8760 hours.  CBG:  Recent Labs Lab 10/09/14 1055  GLUCAP 104*    No results found for this or any previous visit (from the past 240 hour(s)).   Studies: Dg Chest 2 View  10/11/2014   CLINICAL DATA:  Cough. Laryngeal cancer. Recent perforated diverticulitis. Recent fever. Dysphagia.  EXAM: CHEST  2 VIEW  COMPARISON:  10/09/2014 chest radiograph.  FINDINGS: Right internal jugular Port-A-Cath terminates at the cavoatrial junction. Median sternotomy wires are stable in configuration and appear intact. Stable cardiomediastinal silhouette with normal heart size. No pneumothorax. No pleural effusion. No pulmonary edema. There is new patchy opacity in the basilar right middle lobe and right lower lobe. There are healing subacute fractures of the anterior left fifth and sixth ribs.  IMPRESSION: 1. New patchy lung opacity in the basilar right middle lobe and basilar right lower lobe, suspicious for pneumonia. Recommend follow-up post treatment PA and lateral chest radiographs in 6-8 weeks. 2. Healing nondisplaced anterior left 5th and 6th rib fractures.   Electronically Signed   By: Ilona Sorrel M.D.   On: 10/11/2014 17:43   Scheduled Meds: . antiseptic oral rinse  7 mL Mouth Rinse BID  . enoxaparin (LOVENOX) injection  40 mg Subcutaneous  Q24H  . fentaNYL  100 mcg Transdermal Q72H  . levofloxacin (LEVAQUIN) IV  750 mg Intravenous Q24H  . LORazepam  0.25 mg Intravenous TID  . metoprolol  2.5 mg Intravenous Q12H  . metronidazole  500 mg Intravenous Q8H  . pantoprazole (PROTONIX) IV  40 mg Intravenous Q24H  . vancomycin  750 mg Intravenous Q8H   Continuous Infusions: . dextrose 5 % and 0.9% NaCl 125 mL/hr at 10/12/14 0545  . feeding supplement (PIVOT 1.5 CAL)     Principal Problem:   Fever in adult Active Problems:   CAD-hx of CABG '06, DES LCX 01/2013, DES SVG[facility]LAD 5/21/5 at [facility]   Ischemic cardiomyopathy, EF 45%   Fever, possible  aspiration-treated with antibiotics   Squamous cell carcinoma of larynx   Cancer associated pain   Dysphagia  Time spent: [place], Student-PA   [facility] If 7PM-7AM, please contact night-coverage at [email or web address], password [facility] 10/12/2014, 12:55 PM  LOS: 1 day    Addendum  I personally evaluated patient on 10/12/2014 and agree with above findings. Mr. Schoenherr is a pleasant 59 year old gentleman with a past medical history of laryngeal cancer stage III (T3, N0, M0) squamous cell carcinoma, who was previously treated with Cetuximab with subsequently developing cardiac arrest. He also has a history of perforated diverticulitis status post exploratory laparotomy, sigmoid colectomy with end colostomy. He is currently receiving radiation therapy at the cancer center. He presented with complaints of increasing difficulties swallowing over the past week, initially to solids than progressing to liquids. He stated being unable to eat anything for the past week. He also reported having fevers and chills with a temperature 101. Initial workup revealed a chest x-ray showing a right lower lobe infiltrate. His dysphasia in the context of laryngeal cancer certainly puts him at high risk for aspiration. Initial workup included a chest x-ray that showed right lower lobe infiltrate. He was started on empiric IV antibiotic therapy with vancomycin and Zosyn. Case discussed with radiology as he was taken down for NG tube placement. Plan to start tube feeds with nutrition consultation. Will monitor closely for refeeding syndrome, check magnesium, phosphorus and compress metabolic panel in a.m. On physical examination he had significant desquamation over anterior part of neck from radiation therapy. Lungs were clear to auscultation, abdomen benign.

## 2014-10-12 NOTE — Progress Notes (Signed)
[name]   DOB:1955/09/05   WY#:[id number]    Subjective:Patient had significant confusion overnight. He was scared. Nursing staff reported he thought he has seen "blood all over his bed". He had been getting intermittent Ativan doses. He recognized me today. He felt his skin and throat was "burning like fire", rated his pain at 70-80/10. Continue to have production of thick mucus and severe dry mouth. He is using intermittent suctioning device.   Objective:  Filed Vitals:   10/12/14 0658  BP: 120/55  Pulse: 88  Temp: 97.5 F (36.4 C)  Resp: 16     Intake/Output Summary (Last 24 hours) at 10/12/14 0802 Last data filed at 10/12/14 0544  Gross per 24 hour  Intake 1722.92 ml  Output    400 ml  Net 1322.92 ml    GENERAL:alert, no distress and comfortable SKIN: significant ulcerated skin around his neck with dried blood EYES: normal, Conjunctiva are pink and non-injected, sclera clear OROPHARYNX:dry mucous membrane. No thrush NECK: ulcerated skin LYMPH:  no palpable lymphadenopathy in the cervical, axillary or inguinal LUNGS: right lung base crackles. Normal breathing effort HEART: regular rate & rhythm and no murmurs and no lower extremity edema ABDOMEN:abdomen soft, non-tender and normal bowel sounds Musculoskeletal:no cyanosis of digits and no clubbing  NEURO: alert & oriented x 3 with fluent speech, no focal motor/sensory deficits   Labs:  Lab Results  Component Value Date   WBC 7.7 10/11/2014   HGB 9.2* 10/11/2014   HCT 28.6* 10/11/2014   MCV 92.9 10/11/2014   PLT 273 10/11/2014   NEUTROABS 3.4 09/25/2014    Lab Results  Component Value Date   NA 139 10/11/2014   K 3.7 10/11/2014   CL 104 10/11/2014   CO2 31 10/11/2014    Studies: I have reviewed the chest x-ray. Dg Chest 2 View  10/11/2014   CLINICAL DATA:  Cough. Laryngeal cancer. Recent perforated diverticulitis. Recent fever. Dysphagia.  EXAM: CHEST  2 VIEW  COMPARISON:  10/09/2014 chest radiograph.   FINDINGS: Right internal jugular Port-A-Cath terminates at the cavoatrial junction. Median sternotomy wires are stable in configuration and appear intact. Stable cardiomediastinal silhouette with normal heart size. No pneumothorax. No pleural effusion. No pulmonary edema. There is new patchy opacity in the basilar right middle lobe and right lower lobe. There are healing subacute fractures of the anterior left fifth and sixth ribs.  IMPRESSION: 1. New patchy lung opacity in the basilar right middle lobe and basilar right lower lobe, suspicious for pneumonia. Recommend follow-up post treatment PA and lateral chest radiographs in 6-8 weeks. 2. Healing nondisplaced anterior left 5th and 6th rib fractures.   Electronically Signed   By: Ilona Sorrel M.D.   On: 10/11/2014 17:43    Assessment & Plan:   Squamous cell carcinoma of larynx He has progressive decline. He is now unable to swallow anything by mouth. The dietitian to observe his swallowing and raise concern that the patient may be at risk of aspiration. He is not able to take any of his pills. I recommend admission to the hospital for further workup and management. I will defer to Dr. Pablo Ledger to manage his radiation treatment while hospitalized  Infection, respiratory tract, possible RLL pneumonia The patient had unresolved upper respiratory tract infection and now with possible skin infection. Due to inability to swallow antibiotics, I believe he would need IV antibiotics. Continue IV antibiotics, cultures   Skin ulceration He has significant skin ulceration. He has significant redness & discharging  wound around his neck. He would need wound care.  Protein calorie malnutrition The patient is not able to tolerate any form of oral intake. The dietitian is concerned he may be at risk of aspiration. The patient would benefit from some sort enterofeeding tube. The patient would be at risk of refeeding syndrome. Monitor  carefully  Uncontrollable mucositis and neck pain He is on fentanyl patch and intermittent doses of liquid morphine. He would need aggressive pain management and I agree with involvement of palliative care service for assistance in pain management and other symptomatic management.  Anemia of chronic disease He is not symptomatic. No need for blood transfusion  Intermittent confusion This is likely related to delirium. I would recommend family members involvement to orient the patient and reduce the risk of delirium and falls  Recent non-STEMI Clinically, he is not symptomatic. There is no signs and symptoms of congestive heart failure. Continue medical management  Discharge planning He had a lot of ongoing medical issues. Likely would need to be in the hospital over the weekend. We will get a physical therapy assessment in the near future to determine whether it would be safe for the patient to be discharged home.  I have discussed all the above fully with the hospitalist. I will return next week to follow.  [place], [place], MD 10/12/2014  8:02 AM

## 2014-10-12 NOTE — Progress Notes (Signed)
Initial Nutrition Assessment  DOCUMENTATION CODES:   Not applicable  INTERVENTION:  Pivot 1.5 Continuous 1.333L  Goal rate of 61ml/hr Begin at 40 ml/hr increase by 5 q 6 hours until goal is reached.  Provides 2000 Kcal, 125g Pro   NUTRITION DIAGNOSIS:   Inadequate oral intake related to inability to eat, vomiting, cancer and cancer related treatments, nausea, dysphagia as evidenced by per patient/family report.    GOAL:   Patient will meet greater than or equal to 90% of their needs    MONITOR:   TF tolerance, I & O's, Labs, Diet advancement  REASON FOR ASSESSMENT:   Malnutrition Screening Tool    ASSESSMENT:   59 yo male with laryngeal cancer, recent colostemy due to perforated diverticulitis, CAD, s/p CABG, MI, Chronic CHF. Pts Mother-in-Law gave me hx while pt was sleeping. Recent dysphagia diagnosis has shown patient refuse all solids, and liquids, vomitting/choking immediately.  Pt is currently receiving radiation and chemotherapy. Experienced anaphylaxis from chemo and has since been discontinued. Has some pain in throat but is not severe.. Has also been coughing up yellow mucus. Pt does not depict signs/symptoms of malnutrition upon exam. Consulted for Tube Feed, placed order.    Diet Order:  Diet NPO time specified  Skin:  Wound (see comment) (Cracking skin at neck.)  Last BM:  9/29  Height:   Ht Readings from Last 1 Encounters:  10/10/14 5\' 7"  (1.702 m)    Weight:   Wt Readings from Last 1 Encounters:  10/10/14 146 lb (66.225 kg)    Ideal Body Weight:  67.27 kg  BMI:  There is no weight on file to calculate BMI.  Estimated Nutritional Needs:   Kcal:  2000-2200  Protein:  80-100 grams  Fluid:  >=/ 2L  EDUCATION NEEDS:   No education needs identified at this time  Satira Anis. Ward, MS, RD LDN After Hours/Weekend Pager [phone]

## 2014-10-12 NOTE — Progress Notes (Signed)
The following skin care regimen is recommended for patient's neck. : clean neck with normal saline soaked sterile gauze, leave on 10 minutes and pat dry.  Leave open to air during day.  Apply silvadene to affected area at bedtime and cover with telfa dressing. Secure with gauze roll.  [name], MD [phone]

## 2014-10-12 NOTE — Evaluation (Addendum)
Clinical/Bedside Swallow Evaluation Patient Details  Name: DEVARIO BUCKLEW MRN: [id number] Date of Birth: 04-16-1955  Today's Date: 10/12/2014 Time: SLP Start Time (ACUTE ONLY): 1145 SLP Stop Time (ACUTE ONLY): 1158 SLP Time Calculation (min) (ACUTE ONLY): 13 min  Past Medical History:  Past Medical History  Diagnosis Date  . Carotid stenosis   . Coronary artery disease     s/p CABG 2006; stenting to vein graft 05-2013; NSTEMI 06-2013 in setting of PEA arrest  . Subclavian artery stenosis, left     s/p stenting 2006  . Hypertension   . Hyperlipidemia   . Ischemic cardiomyopathy     echo 06/2013 EF 10-15% (normal 01/2013)  . At risk for sudden cardiac death, has lifevest at discharge 06/20/2013  . Shock liver 06/20/2013  . Acute MI, troponin > 20, no obvious culprit vessel 06/20/2013  . Hypothermia, induced, post arrest 06/20/2013  . Acute encephalopathy, improved 06/20/2013  . Dizziness 06/20/2013  . Weakness due to cardiac arrest 06/20/2013  . Fever, possible aspiration-treated with antibiotics 06/20/2013  . Tobacco abuse     >100 pack year history   . Dyslipidemia 10/09/2013    [place] Study atorvastatin -eIRB # [id number] tablet Take 40 mg by mouth daily.   Marland Kitchen PAD (peripheral artery disease) 10/09/2013    Occluded left common carotid and moderate right internal carotid artery stenosis. Previous stent to the left subclavian artery. Right iliac stent. Bilateral 60-99% renal artery stenosis    . Heart failure, acute on chronic, systolic and diastolic 36/46/8032  . Anxiety   . GERD (gastroesophageal reflux disease)   . 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   Past Surgical History:  Past Surgical History  Procedure Laterality Date  . Angioplasty  05/2013    stent to SVG - LAD vein graft at [facility]  . Coronary artery bypass graft  09/2004    SVG to LAD (at [facility])  . Nm myocar perf wall motion  06/2008    persantine myoview - normal pattern of systolic thickening/wall motion/perfusion;  . Lower extremity  arterial doppler  08/2008    right and left ABI - mild arterial insufficiency at rest; R CIA/stent with 50-69% narrowing, L CIA with increased velocities (50-69% diameter reduction)  . Renal artery doppler  05/2008    right and left prox renal arteries with narrowing and increased velocities (60-99% diameter reduction)  . Iliac artery stent  07/24/2008    8x6 Smart nitinol self-expanding stent to origin of ilaci down into external iliac crossing the hypogastric artery (Dr. Adora Fridge)  . Subclavian angiogram Left 08/20/2004    8x10mm Genesis balloon mounted stent  . Left heart catheterization with coronary/graft angiogram N/A 06/15/2013    Procedure: LEFT HEART CATHETERIZATION WITH Beatrix Fetters;  Surgeon: Sinclair Grooms, MD;  Location: [facility] CATH LAB;  Service: Cardiovascular;  Laterality: N/A;  . Cardiac catheterization    . Direct laryngoscopy N/A 07/06/2014    Procedure: DIRECT LARYNGOSCOPY AND ESOPAGOSCOPY WITH BIOPSY;  Surgeon: [name], MD;  Location: [place];  Service: ENT;  Laterality: N/A;  . Laparotomy N/A 08/05/2014    Procedure: EXPLORATORY LAPAROTOMY WITH SIGMOIDOTOMY WITH COLECTOMY & COLOSTOMY;  Surgeon: Coralie Keens, MD;  Location: [place];  Service: General;  Laterality: N/A;   HPI:  59 yr old admitted with fever and inability to swallow. PMH: laryngeal cancer, currently receiving radiation to neck (nearing end of course of tx), chemo in past, recent colostomy due to perforated diverticulitis, coronary arteries disease status post  CABG, MI subsequent to colostomy, chronic systolic heart failure, and remote alcohol abuse. Per MD note, increasing dysphagia (initially difficulty with solids, now also with liquids) and some odnophagia. CXR new patchy lung opacity in the basilar right middle lobe and basilar right lower lobe, suspicious for pneumonia.   Assessment / Plan / Recommendation Clinical Impression  Decreased secretion management; pt observed to frequently suction saliva  from oral cavity and cough saliva/mucous from pharynx. Globus sensation after small trials ice chip and 1/3 teaspoon applesauce. Laryngeal elevation during palpation with greater ROM than expected. Unfortunately, pt appears unable to protect his airway and and suspect inability to achieve adequate tongue base retraction, larygneal elevation and pharyngeal contraction. Pt has significant radiation burns on lower neck tissue. Continue NPO status; discussed with MD who plans for nasal feeding tube. Pt not ready or appropriate for MBS or swallow intervention at this time (not through with radiation therapy). ST will sign off, however recommend reconsult when radiation therapy complete and reassess with objective measure at that time.          Aspiration Risk  Severe    Diet Recommendation NPO   Medication Administration: Via alternative means    Other  Recommendations Oral Care Recommendations: Oral care QID   Follow Up Recommendations       Frequency and Duration        Pertinent Vitals/Pain none    SLP Swallow Goals     Swallow Study Prior Functional Status       General Other Pertinent Information: 59 yr old admitted with fever and inability to swallow. PMH: laryngeal cancer, currently receiving radiation to neck (nearing end of course of tx), chemo in past, recent colostomy due to perforated diverticulitis, coronary arteries disease status post CABG, MI subsequent to colostomy, chronic systolic heart failure, and remote alcohol abuse. Per MD note, increasing dysphagia (initially difficulty with solids, now also with liquids) and some odnophagia. CXR new patchy lung opacity in the basilar right middle lobe and basilar right lower lobe, suspicious for pneumonia. Type of Study: Bedside swallow evaluation Previous Swallow Assessment:  (none) Diet Prior to this Study: NPO Temperature Spikes Noted: No Respiratory Status: Room air History of Recent Intubation: No Behavior/Cognition:  Alert;Cooperative;Pleasant mood Oral Cavity - Dentition: Adequate natural dentition/normal for age Self-Feeding Abilities: Able to feed self Patient Positioning: Upright in bed Baseline Vocal Quality: Hoarse Volitional Cough: Strong Volitional Swallow: Able to elicit    Oral/Motor/Sensory Function Overall Oral Motor/Sensory Function: Appears within functional limits for tasks assessed   Ice Chips Ice chips: Impaired Presentation: Spoon Pharyngeal Phase Impairments: Suspected delayed Swallow;Throat Clearing - Delayed;Cough - Delayed (highly suspect pharyngeal residue)   Thin Liquid Thin Liquid: Not tested    Nectar Thick Nectar Thick Liquid: Not tested   Honey Thick Honey Thick Liquid: Not tested   Puree Puree: Impaired (1/3 tsp) Presentation: Spoon Pharyngeal Phase Impairments: Multiple swallows;Throat Clearing - Delayed   Solid   GO    Solid: Not tested       Houston Siren 10/12/2014,1:07 PM  Orbie Pyo [place].Ed [employer] [phone]

## 2014-10-12 NOTE — Consult Note (Signed)
WOC wound consult note Reason for Consult:moist cell desquamation from radiation therapy with full thickness tissue loss at anterior neck Wound type:thermal Pressure Ulcer POA: No Measurement: circumferential neck with erythema and dried serum (scabbing).  Full thickness tissue loss at anterior neck measuring 3cm x 14cm x 0.4cm Wound bed: pink with yellow exudate obscuring full view Drainage (amount, consistency, odor) yellow exudate Periwound:erythematous Dressing procedure/placement/frequency: I will implement twice daily and PRN cleansing of the tissue with NS and gently patting dry to soften and eventually remove the dried serum (scabbing).  Wound exudate will be managed with absorption and provision of wound contact with an antimicrobial textile (Dermatherapy) that is cool and silk-like to the touch (for comfort).  Patient and wife are taught about the product: its design and purpose and are in agreement with plan.  (They had feared/been anxious of potential application and removal of topical creams or ointments to this highly sensitive area.) Both are in agreement with POC. [place] nursing team will not follow, but will remain available to this patient, the nursing and medical teams.  Please re-consult if needed. Thanks, Maudie Flakes, MSN, RN, [place], [place], [place] [phone])

## 2014-10-12 NOTE — Progress Notes (Signed)
Offered to cleaned and apply Silvadene on pt neck. Pt asked questions and refused to have wound touched. Asked for pain medication only. Pain medication was administered via NGT.

## 2014-10-12 NOTE — Consult Note (Signed)
Consultation Note Date: 10/12/2014   Patient Name: [name]  DOB: 1955-04-16  MRN: [id number]  Age / Sex: 59 y.o., male   PCP: [name], NP Referring Physician: Kelvin Cellar, MD  Reason for Consultation: Establishing goals of care, Non pain symptom management, Pain control and Psychosocial/spiritual support  Palliative Care Assessment and Plan Summary of Established Goals of Care and Medical Treatment Preferences    Palliative Care Discussion Held Today:    This NP Wadie Lessen reviewed medical records, received report from team, assessed the patient and then meet at the patient's bedside  to discuss diagnosis and current treatment plan,  GOC,disposition and anticipatory care needs.  Concept of  Palliative Care was discussed and how it is not hospice.  A discussion was had today regarding importance of  advanced directives, the documentation of and continued reassessment of in order to honor  patietn wishes and patient centered care.    Values and goals of care important to patient and family were attempted to be elicited.  Pain management and  overall symptom management strategies were discussed   Questions and concerns addressed.   Patient encouraged to call with questions or concerns.  PMT will continue to support holistically.   Primary Decision Maker: patient at this time, documented HPOA is ex-wife Danley Danker, mother in law Vivien Rota is main support person at home  Goals of Care/Code Status/Advance Care Planning:   Code Status: Full code Patient is open to all offered and available medical interventions to prolong life.  He is hopeful for continued quality life.  Symptom Management:    Pain: Continue  Fentanyl Patch 100 mcg           Utilize Roxanol 20 mg po/sl every 2 hrs prn (patient tolerated it well today)  Will re-evaluate tomorrow for medication adjustments needs.  Is having Doboff tube placed today   Psycho-social/Spiritual:   Support  System: ex wife and mother in law, patient has 10 children            Dense psycho-social  situation.  Patient has custody of five children including  75 yo/8 yo  and 29 yo    Plan is to re-meet tomorrow with patient and his support persons at 1200   Prognosis: Unable to determine  Discharge Planning:  Pending outcomes, likely home with Home health      Chief Complaint: dysphagia, weakness and fever    History of Present Illness:   59 y.o. 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 The patient has been receiving radiation to his neck and he is towards the end of his course of radiation. He has received chemotherapy in the past however developed anaphylaxis from it and therefore chemotherapy was discontinued.  Patient has been having increasing dysphagia, overall failure to thrive, poor po intake and weight loss.     Primary Diagnoses  Present on Admission:  . CAD-hx of CABG '06, DES LCX 01/2013, DES SVG-LAD 5/21/5 at [facility] . Cancer associated pain . Ischemic cardiomyopathy, EF 45% . Squamous cell carcinoma of larynx . Fever, possible aspiration-treated with antibiotics  Palliative Review of Systems:    -pain at radiation site of neck, poor po intake, weakness and fatigue   I have reviewed the medical record, interviewed the patient and family, and examined the patient. The following aspects are pertinent.  Past Medical History  Diagnosis Date  . Carotid stenosis   . Coronary artery disease     s/p CABG 2006; stenting to vein graft 05-2013; NSTEMI 06-2013 in setting of PEA arrest  . Subclavian artery stenosis, left     s/p stenting 2006  . Hypertension   . Hyperlipidemia   . Ischemic cardiomyopathy     echo 06/2013 EF 10-15% (normal 01/2013)  . At risk for sudden cardiac death, has lifevest at discharge  06/20/2013  . Shock liver 06/20/2013  . Acute MI, troponin > 20, no obvious culprit vessel 06/20/2013  . Hypothermia, induced, post arrest 06/20/2013  . Acute encephalopathy, improved 06/20/2013  . Dizziness 06/20/2013  . Weakness due to cardiac arrest 06/20/2013  . Fever, possible aspiration-treated with antibiotics 06/20/2013  . Tobacco abuse     >100 pack year history   . Dyslipidemia 10/09/2013    [place] Study atorvastatin -eIRB # [id number] tablet Take 40 mg by mouth daily.   Marland Kitchen PAD (peripheral artery disease) 10/09/2013    Occluded left common carotid and moderate right internal carotid artery stenosis. Previous stent to the left subclavian artery. Right iliac stent. Bilateral 60-99% renal artery stenosis    . Heart failure, acute on chronic, systolic and diastolic 11/09/2534  . Anxiety   . GERD (gastroesophageal reflux disease)   . 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   Social History   Social History  . Marital Status: Married    Spouse Name: N/A  . Number of Children: 10  . Years of Education: 10   Occupational History  . carpenter    Social History Main Topics  . Smoking status: Former Smoker -- 4.00 packs/day for 45 years    Types: Cigarettes    Quit date: 06/09/2013  . Smokeless tobacco: Former Systems developer     Comment: tried for a few weeks  . Alcohol Use: No     Comment: 3-6 12 oz cans daily   . Drug Use: No  . Sexual Activity: No   Other Topics Concern  . None   Social History Narrative   Family History  Problem Relation Age of Onset  . Heart disease Maternal Aunt   . Heart attack Mother   . Stroke Mother   . Cancer Father   . Stroke Sister   . Heart Problems Sister    Scheduled Meds: . antiseptic oral rinse  7 mL Mouth Rinse BID  . enoxaparin (LOVENOX) injection  40 mg Subcutaneous Q24H  . [START ON 10/14/2014] fentaNYL  100 mcg Transdermal Q72H  . levofloxacin (LEVAQUIN) IV  750 mg Intravenous Q24H  . LORazepam  0.25 mg Intravenous TID  . LORazepam  0.25 mg Intravenous  Once  . metoprolol  2.5 mg Intravenous Q12H  . metronidazole  500 mg Intravenous Q8H  . pantoprazole (PROTONIX) IV  40 mg Intravenous Q24H  . vancomycin  750 mg Intravenous Q8H   Continuous Infusions: . dextrose 5 % and 0.9% NaCl 125 mL/hr at 10/12/14 0545   PRN Meds:.[DISCONTINUED] acetaminophen **OR** acetaminophen, albuterol, morphine CONCENTRATE, ondansetron **OR** ondansetron (ZOFRAN) IV Medications Prior to Admission:  Prior to Admission medications   Medication Sig Start Date End Date Taking? Authorizing Provider  acetaminophen (TYLENOL) 500 MG tablet Take 1,000 mg by mouth every 6 (six) hours as needed for mild pain, moderate pain or headache.   Yes Historical Provider, MD  albuterol (PROVENTIL HFA;VENTOLIN HFA) 108 (90 BASE) MCG/ACT inhaler Inhale 2 puffs into the lungs every 6 (six) hours as needed for wheezing  or shortness of breath. 08/12/14  Yes Shanker Kristeen Mans, MD  ALPRAZolam Duanne Moron) 0.5 MG tablet Take 0.5 mg by mouth 3 (three) times daily as needed for anxiety.    Yes Historical Provider, MD  amoxicillin-clavulanate (AUGMENTIN) 875-125 MG tablet Take 1 tablet by mouth 2 (two) times daily. 10/10/14  Yes [name], MD  aspirin EC 81 MG tablet Take 81 mg by mouth daily.   Yes Historical Provider, MD  buPROPion (WELLBUTRIN XL) 300 MG 24 hr tablet Take 300 mg by mouth daily.   Yes Historical Provider, MD  carvedilol (COREG) 12.5 MG tablet Take 0.5 tablets (6.25 mg total) by mouth 2 (two) times daily. 08/12/14  Yes Shanker Kristeen Mans, MD  fentaNYL (DURAGESIC - DOSED MCG/HR) 100 MCG/HR Place 1 patch (100 mcg total) onto the skin every 3 (three) days. 10/03/14  Yes [name], MD  lidocaine-prilocaine (EMLA) cream Apply 1 application topically as directed. Apply to affected area once 09/05/14  Yes [name], MD  morphine (MSIR) 30 MG tablet Take 1 tablet (30 mg total) by mouth every 4 (four) hours as needed for severe pain. 10/10/14  Yes [name], MD  Morphine Sulfate (MORPHINE  CONCENTRATE) 10 mg / 0.5 ml concentrated solution Take 1 mL (20 mg total) by mouth every 2 (two) hours as needed for severe pain. 09/05/14  Yes [name], MD  pantoprazole (PROTONIX) 40 MG tablet TAKE 1 TABLET BY MOUTH EVERY DAY Patient taking differently: TAKE 40 MG BY MOUTH DAILY 04/17/14  Yes Mihai Croitoru, MD  potassium chloride SA (K-DUR,KLOR-CON) 20 MEQ tablet TAKE 20 MEQ BY MOUTH ONCE DAILY 06/08/14  Yes Historical Provider, MD  prochlorperazine (COMPAZINE) 10 MG tablet Take 1 tablet (10 mg total) by mouth every 6 (six) hours as needed for nausea or vomiting. 10/06/14  Yes [name], MD   Allergies  Allergen Reactions  . Cetuximab Anaphylaxis    Cardiac arrest 08/02/2014   CBC:    Component Value Date/Time   WBC 7.7 10/11/2014 1612   WBC 6.0 09/25/2014 0948   HGB 9.2* 10/11/2014 1612   HGB 11.4* 09/25/2014 0948   HCT 28.6* 10/11/2014 1612   HCT 35.0* 09/25/2014 0948   PLT 273 10/11/2014 1612   PLT 371 09/25/2014 0948   MCV 92.9 10/11/2014 1612   MCV 91.8 09/25/2014 0948   NEUTROABS 3.4 09/25/2014 0948   NEUTROABS 6.4 08/08/2014 0434   LYMPHSABS 1.5 09/25/2014 0948   LYMPHSABS 1.8 08/08/2014 0434   MONOABS 0.8 09/25/2014 0948   MONOABS 0.9 08/08/2014 0434   EOSABS 0.3 09/25/2014 0948   EOSABS 0.5 08/08/2014 0434   BASOSABS 0.0 09/25/2014 0948   BASOSABS 0.0 08/08/2014 0434   Comprehensive Metabolic Panel:    Component Value Date/Time   NA 139 10/11/2014 1612   NA 139 09/25/2014 0948   K 3.7 10/11/2014 1612   K 4.7 09/25/2014 0948   CL 104 10/11/2014 1612   CO2 31 10/11/2014 1612   CO2 30* 09/25/2014 0948   BUN 8 10/11/2014 1612   BUN 7.9 09/25/2014 0948   CREATININE 0.62 10/11/2014 1612   CREATININE 0.9 09/25/2014 0948   CREATININE 1.29 01/11/2014 0907   GLUCOSE 99 10/11/2014 1612   GLUCOSE 98 09/25/2014 0948   CALCIUM 8.2* 10/11/2014 1612   CALCIUM 9.7 09/25/2014 0948   AST 23 10/09/2014 1138   AST 34 09/25/2014 0948   ALT 15* 10/09/2014 1138   ALT 25  09/25/2014 0948   ALKPHOS 115 10/09/2014 1138   ALKPHOS  140 09/25/2014 0948   BILITOT 0.5 10/09/2014 1138   BILITOT 0.34 09/25/2014 0948   PROT 6.9 10/09/2014 1138   PROT 6.9 09/25/2014 0948   ALBUMIN 3.4* 10/09/2014 1138   ALBUMIN 3.3* 09/25/2014 0948    Physical Exam:  Vital Signs: BP 120/55 mmHg  Pulse 88  Temp(Src) 97.5 F (36.4 C) (Oral)  Resp 16  SpO2 97% SpO2: SpO2: 97 % O2 Device: O2 Device: Not Delivered O2 Flow Rate:   Intake/output summary:  Intake/Output Summary (Last 24 hours) at 10/12/14 1041 Last data filed at 10/12/14 0544  Gross per 24 hour  Intake 1722.92 ml  Output    400 ml  Net 1322.92 ml   LBM: Last BM Date: 10/12/14 Baseline Weight:   Most recent weight:    Exam Findings:   General: chronically ill appearing HEENT: buccal membranes, moist, no exudate.  large amount of clear/white mucous that patietn is unable to swallow/using suction  CVS: RRR Resp: CTA Extrem: without edeam Skin: noted radiation skin changes at base of neck, central area is moist with yellowish exudate, scabbign noted Neuro: alert and orietned           Palliative Performance Scale: 60 %                Additional Data Reviewed: Recent Labs     10/09/14  1138  10/11/14  1612  WBC  10.6*  7.7  HGB  11.0*  9.2*  PLT  277  273  NA  136  139  BUN  9  8  CREATININE  0.82  0.62     Time In: 0915 Time Out: 1045 Time Total: 90 min  Greater than 50%  of this time was spent counseling and coordinating care related to the above assessment and plan.  Discussed with  Dr Coralyn Pear and Maryan Puls RN navigator for Dr Alvy Bimler  Signed by: Wadie Lessen, NP  Knox Royalty, NP  10/12/2014, 10:41 AM  Please contact Palliative Medicine Team phone at [phone] for questions and concerns.   See AMION for contact information

## 2014-10-13 ENCOUNTER — Ambulatory Visit: Payer: Medicaid Other

## 2014-10-13 ENCOUNTER — Encounter: Payer: Self-pay | Admitting: Nutrition

## 2014-10-13 DIAGNOSIS — R070 Pain in throat: Secondary | ICD-10-CM

## 2014-10-13 DIAGNOSIS — Z515 Encounter for palliative care: Secondary | ICD-10-CM

## 2014-10-13 DIAGNOSIS — E43 Unspecified severe protein-calorie malnutrition: Secondary | ICD-10-CM | POA: Insufficient documentation

## 2014-10-13 DIAGNOSIS — K117 Disturbances of salivary secretion: Secondary | ICD-10-CM

## 2014-10-13 DIAGNOSIS — Z01810 Encounter for preprocedural cardiovascular examination: Secondary | ICD-10-CM

## 2014-10-13 LAB — COMPREHENSIVE METABOLIC PANEL
ALBUMIN: 2.4 g/dL — AB (ref 3.5–5.0)
ALT: 11 U/L — AB (ref 17–63)
AST: 22 U/L (ref 15–41)
Alkaline Phosphatase: 86 U/L (ref 38–126)
Anion gap: 5 (ref 5–15)
BUN: 5 mg/dL — AB (ref 6–20)
CHLORIDE: 105 mmol/L (ref 101–111)
CO2: 29 mmol/L (ref 22–32)
CREATININE: 0.71 mg/dL (ref 0.61–1.24)
Calcium: 7.9 mg/dL — ABNORMAL LOW (ref 8.9–10.3)
GFR calc Af Amer: >60 mL/min (ref >60)
GLUCOSE: 126 mg/dL — AB (ref 65–99)
POTASSIUM: 3.1 mmol/L — AB (ref 3.5–5.1)
SODIUM: 139 mmol/L (ref 135–145)
Total Bilirubin: 0.5 mg/dL (ref 0.3–1.2)
Total Protein: 5.3 g/dL — ABNORMAL LOW (ref 6.5–8.1)

## 2014-10-13 LAB — CBC
HCT: 29.2 % — ABNORMAL LOW (ref 39.0–52.0)
Hemoglobin: 9.3 g/dL — ABNORMAL LOW (ref 13.0–17.0)
MCH: 29.2 pg (ref 26.0–34.0)
MCHC: 31.8 g/dL (ref 30.0–36.0)
MCV: 91.8 fL (ref 78.0–100.0)
PLATELETS: 290 10*3/uL (ref 150–400)
RBC: 3.18 MIL/uL — ABNORMAL LOW (ref 4.22–5.81)
RDW: 15.3 % (ref 11.5–15.5)
WBC: 4.5 10*3/uL (ref 4.0–10.5)

## 2014-10-13 LAB — GLUCOSE, CAPILLARY
GLUCOSE-CAPILLARY: 136 mg/dL — AB (ref 65–99)
GLUCOSE-CAPILLARY: 102 mg/dL — AB (ref 65–99)

## 2014-10-13 LAB — VANCOMYCIN, TROUGH: Vancomycin Tr: 16 ug/mL (ref 10.0–20.0)

## 2014-10-13 LAB — MAGNESIUM: MAGNESIUM: 1.6 mg/dL — AB (ref 1.7–2.4)

## 2014-10-13 LAB — PHOSPHORUS: PHOSPHORUS: 1.7 mg/dL — AB (ref 2.5–4.6)

## 2014-10-13 MED ORDER — POTASSIUM CHLORIDE CRYS ER 20 MEQ PO TBCR
40.0000 meq | EXTENDED_RELEASE_TABLET | Freq: Four times a day (QID) | ORAL | Status: AC
  Administered 2014-10-13 (×2): 40 meq via ORAL
  Filled 2014-10-13 (×2): qty 2

## 2014-10-13 MED ORDER — JEVITY 1.2 CAL PO LIQD: 1000.0000 mL | ORAL | Status: DC

## 2014-10-13 MED ORDER — VITAL 1.5 CAL PO LIQD
1000.0000 mL | ORAL | Status: DC
  Administered 2014-10-13 – 2014-10-15 (×3): 1000 mL
  Filled 2014-10-13 (×8): qty 1000

## 2014-10-13 MED ORDER — SCOPOLAMINE 1 MG/3DAYS TD PT72
1.0000 | MEDICATED_PATCH | TRANSDERMAL | Status: DC
  Administered 2014-10-13 – 2014-10-19 (×3): 1.5 mg via TRANSDERMAL
  Filled 2014-10-13 (×3): qty 1

## 2014-10-13 MED ORDER — POTASSIUM PHOSPHATES 15 MMOLE/5ML IV SOLN
15.0000 mmol | Freq: Once | INTRAVENOUS | Status: AC
  Administered 2014-10-13: 15 mmol via INTRAVENOUS
  Filled 2014-10-13: qty 5

## 2014-10-13 MED ORDER — MAGNESIUM SULFATE IN D5W 10-5 MG/ML-% IV SOLN
1.0000 g | Freq: Once | INTRAVENOUS | Status: AC
  Administered 2014-10-13: 1 g via INTRAVENOUS
  Filled 2014-10-13: qty 100

## 2014-10-13 MED ORDER — FREE WATER
100.0000 mL | Freq: Four times a day (QID) | Status: DC
  Administered 2014-10-13 – 2014-10-20 (×24): 100 mL

## 2014-10-13 MED ORDER — SENNOSIDES 8.8 MG/5ML PO SYRP
10.0000 mL | ORAL_SOLUTION | Freq: Two times a day (BID) | ORAL | Status: DC
  Administered 2014-10-13 – 2014-10-20 (×10): 10 mL via ORAL
  Filled 2014-10-13 (×17): qty 10

## 2014-10-13 NOTE — Progress Notes (Signed)
  Oncology Nurse Navigator Documentation     Patient Visit Type: Inpatient (10/12/14 0940)     To offer support and encouragement, visited pt [facility] to check on wellbeing since yesterday's admission. Subjectively, he was more alert, seemed less confused than when I saw him yesterday in [place]. He reported feeling better though experiencing neck pain r/t to desquamation, inflammation.  We discussed strategy for taking PRN pain med prior to today's scheduled RT. I will continue to follow during this admission.  [name], RN, BSN, [place] at [place] [phone]                    Time Spent with Patient: 30 (10/12/14 0940)

## 2014-10-13 NOTE — Progress Notes (Signed)
Nutrition Follow-up  DOCUMENTATION CODES:   Severe malnutrition in context of acute illness/injury  INTERVENTION:  - Will order: Vital 1.5 @ 20 mL/hr and advance by 10 mL Q12h to reach goal rate of 60 mL/hr. Goal rate will provide 2160 kcal, 97 grams protein, and 1100 mL free water. Will also order 100 mL free water QID to add additional 400 mL free water. - Monitor magnesium, potassium, and phosphorus daily for at least 3 days, MD to replete as needed, as pt is at risk for refeeding syndrome given minimal PO intake and no PO intake for 3-4 days PTA. - If within [place], recommend discussion with pt about PEG placement should long-term nutrition support be warranted - RD will continue to monitor for needs  NUTRITION DIAGNOSIS:   Increased nutrient needs related to catabolic illness, cancer and cancer related treatments as evidenced by estimated needs. -ongoing  GOAL:   Patient will meet greater than or equal to 90% of their needs -unmet with TF not yet at goal rate  MONITOR:   TF tolerance, Weight trends, Labs, Skin, I & O's  ASSESSMENT:   59 y.o. 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 The patient has been receiving radiation to his neck and he is towards the end of his course of radiation. He has received chemotherapy in the past however developed anaphylaxis from it and therefore chemotherapy was discontinued. Of late the patient has been having increasing dysphagia. This was initially to solids and then to liquids. It is gotten to a point where he states that when he tries to swallow liquid, it comes up right away. He has some pain in his throat but this is not severe.  9/30 Pt seen yesterday for initial assessment with RD to initiate and manage TF at that time. Spoke with [place] RD this AM concerning pt and  she gave this RD information concerning PMH and exhibited concern about refeeding, prolonged poor PO intakes. Please see Grady Ernestene Kiel) note from 9/27 for further information.  Spoke with pt who reports discomfort from NGT and states it is rubbing and feels like it is "poking" him everywhere. He denies abdominal pain or nausea associated with the TF. Pt reports that PTA he had talked with several oncology patients who have had/continue to receive TF and has heard mixed reviews and recommendations as far as if he should accept TF or not. Pt does not seem sure at this time if he has a strong opinion one way or the other.  PTA pt's intakes had decreased (see brief summary above from H&P concerning decline in abilities). He states that for 3-4 days PTA he had nothing PO due to inability to tolerate it and lack of desire to try to take PO.  Per chart review, pt has lost 8 lbs (5% body weight) in the past 23 days which is significant for time frame.  Current order in place for Pivot 1.5 @ 55 mL/hr which will provide 1980 kcal (94% minimum estimated kcal needs), 123 grams protein, and 1002 mL free water. TF was initiated at 40 mL/hr and is currently up to 50 mL/hr. Pt is exhibiting signs of refeeding at this time. Will change TF as outlined above.  Medications reviewed; 40 mEq KCl Q6h. Labs reviewed; BUN: 5, Ca: 7.9 mg/dL, K: 3.1 mmol/L, Phos: 1.7 mg/dL, Mg: 1.6  mg/dL.   9/29 - 59 yo male with laryngeal cancer, recent colostemy due to perforated diverticulitis, CAD, s/p CABG, MI, Chronic CHF.  - Pts Mother-in-Law gave me hx while pt was sleeping.  - Recent dysphagia diagnosis has shown patient refuse all solids, and liquids, vomitting/choking immediately.  - Pt is currently receiving radiation and chemotherapy. Experienced anaphylaxis from chemo and has since been discontinued.  - Has some pain in throat but is not severe.Has also been coughing up yellow mucus.  - Pt does not depict  signs/symptoms of malnutrition upon exam.  - Consulted for Tube Feed, placed order.    Diet Order:  Diet NPO time specified  Skin:  Wound (see comment) (severe neck burn due to radiation treatment)  Last BM:  9/29  Height:   Ht Readings from Last 1 Encounters:  10/12/14 5\' 7"  (1.702 m)    Weight:   Wt Readings from Last 1 Encounters:  10/12/14 146 lb (66.225 kg)    Ideal Body Weight:  67.27 kg (kg)  BMI:  Body mass index is 22.86 kg/(m^2).  Estimated Nutritional Needs:   Kcal:  2100-2350  Protein:  85-100 grams  Fluid:  >/= 2.2 L/day  EDUCATION NEEDS:   No education needs identified at this time     Jarome Matin, RD, LDN Inpatient Clinical Dietitian Pager # [phone] After hours/weekend pager # [phone]

## 2014-10-13 NOTE — Progress Notes (Signed)
Daily Progress Note   Patient Name: [name]       Date: 10/13/2014 DOB: 18-May-1955  Age: 59 y.o. MRN#: [id number] Attending Physician: Kelvin Cellar, MD Primary Care Physician: Imelda Pillow, NP Admit Date: 10/11/2014  Reason for Consultation/Follow-up: Establishing goals of care, Inpatient hospice referral, Non pain symptom management, Pain control and Psychosocial/spiritual support  Subjective:      -pain is not his complaint today, has requested only one prn dose in 18 hrs  -patient is generally uncomfortable, copious amounts of thick secretions/sputum and difficult to handle      -now with Doboff tube for feeding and c/o nausea      -radiation dermatitis      -frustration with "just being in the hospital" and living with serious illness  -Vivien Rota and Penelope at bedside    Length of Stay: 2 days  Current Medications: Scheduled Meds:  . antiseptic oral rinse  7 mL Mouth Rinse BID  . enoxaparin (LOVENOX) injection  40 mg Subcutaneous Q24H  . fentaNYL  100 mcg Transdermal Q72H  . levofloxacin (LEVAQUIN) IV  750 mg Intravenous Q24H  . LORazepam  0.25 mg Intravenous TID  . metoprolol  2.5 mg Intravenous Q12H  . metronidazole  500 mg Intravenous Q8H  . pantoprazole (PROTONIX) IV  40 mg Intravenous Q24H  . potassium chloride  40 mEq Oral Q6H  . silver sulfADIAZINE   Topical Daily  . vancomycin  750 mg Intravenous Q8H    Continuous Infusions: . dextrose 5 % and 0.9% NaCl 125 mL/hr at 10/13/14 0430  . feeding supplement (PIVOT 1.5 CAL) 55 mL/hr at 10/12/14 1742    PRN Meds: [DISCONTINUED] acetaminophen **OR** acetaminophen, albuterol, morphine CONCENTRATE, ondansetron **OR** ondansetron (ZOFRAN) IV  ECOG PERFORMANCE STATUS* (Eastern Cooperative Oncology Group)  0 Fully active, able to continue with all pre-disease activities without restriction. Pt score  1 Restricted in physically strenuous activity but ambulatory and able to carry out work of a light or  sedentary nature, e.g., light house work, office work.   2 Ambulatory and capable of all self-care but unable to carry out any work activities. Up and about more than 50% of waking hours.    3 Capable of only limited self-care. Confined to bed or chair more than 50% of waking hours. 3  4 Completely disabled. Cannot carry on any self-care. Totally confined to bed or chair.   5 Dead.    As published in Am. J. Clin. Oncol.: Eustace Pen, M.M., Colon Flattery., Arbyrd, D.C., Horton, Sharen Hint., Drexel Iha, P.P.: Toxicity And Response Criteria Of The [facility]. [place] 0:[id number], 1982.  The ECOG Performance Status is in the public domain therefore available for public use. To duplicate the scale, please cite the reference above and credit the [facility], Tyler Pita M.D., Group Chair   Vital Signs: BP 116/66 mmHg  Pulse 104  Temp(Src) 98.2 F (36.8 C) (Oral)  Resp 20  Ht 5\' 7"  (1.702 m)  Wt 66.225 kg (146 lb)  BMI 22.86 kg/m2  SpO2 97% SpO2: SpO2: 97 % O2 Device: O2 Device: Not Delivered O2 Flow Rate:    Intake/output summary: No intake or output data in the 24 hours ending 10/13/14 0854 LBM: Last BM Date: 10/12/14 Baseline Weight: Weight: 66.225 kg (146 lb) Most recent weight: Weight: 66.225 kg (146 lb)  Physical Exam:              General: chronically ill  appearing HEENT: buccal membranes, moist, no exudate. Large amount of clear/white mucous that patietn is unable to swallow/using suction     -feeding tube noted  Skin: noted radiation skin changes at base of neck, central area is moist with yellowish exudate, scabbign noted CVS: tachycardic Resp: CTA Extrem: without edema Neuro: alert and orietned  Additional Data Reviewed: Recent Labs     10/12/14  1140  10/13/14  0544  WBC  4.9  4.5  HGB  9.1*  9.3*  PLT  270  290  NA  136  139  BUN  6  5*  CREATININE  0.73  0.71     Problem List:  Patient Active  Problem List   Diagnosis Date Noted  . Laryngeal cancer   . Skin ulceration 10/11/2014  . Fever in adult 10/11/2014  . Dysphagia 10/11/2014  . Infection, respiratory tract 10/10/2014  . Skin sore 10/10/2014  . Protein calorie malnutrition 09/21/2014  . Hypotension due to drugs 09/13/2014  . Throat pain in adult 09/06/2014  . Other emphysema   . Cardiac ischemia   . Perforated bowel   . Alcohol abuse   . Peritonitis   . Hypokalemia   . Hypomagnesemia   . Troponin level elevated   . Bowel perforation 08/05/2014  . NSTEMI (non-ST elevated myocardial infarction) 08/05/2014  . Drug reaction   . CAD in native artery   . Sore throat 08/01/2014  . Cancer associated pain 08/01/2014  . 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 Heart failure, acute on chronic, systolic and diastolic 16/10/9602  . CAD (coronary artery disease), autologous vein bypass graft 10/09/2013  . Dyslipidemia 10/09/2013  . PAD (peripheral artery disease) 10/09/2013  . At risk for sudden cardiac death, has lifevest at discharge 06/20/2013  . Ischemic cardiomyopathy, EF 45% 06/20/2013  . Shock liver 06/20/2013  . Acute MI, troponin > 20, no obvious culprit vessel, NSTEMI anterior wall 06/20/2013  . Hypothermia, induced, post arrest, initially then stopped 06/20/2013  . Acute encephalopathy, improved 06/20/2013  . Dizziness 06/20/2013  . Weakness due to cardiac arrest 06/20/2013  . Fever, possible aspiration-treated with antibiotics 06/20/2013  . Anemia in chronic illness 06/17/2013  . NSVT (nonsustained ventricular tachycardia) 06/17/2013  . CAD-hx of CABG '06, DES LCX 01/2013, DES SVG-LAD 5/21/5 at [facility] 06/17/2013  . Acute respiratory failure with hypoxia, intubated on admit, self-extubatedn 06/13/13 06/10/2013  . Cardiogenic shock 06/10/2013  . Cardiogenic pulmonary edema, improved 06/10/2013  . Cardiac arrest 06/09/2013     Palliative Care Assessment & Plan    Code Status:  Full code  Goals of  Care:  Hopeful for improvement and to continue with offered and available medical interventions to treat his cancer and prolong life.  Symptom Management:  Pain: Continue Fentanyl Patch 100 mcg  Utilize Roxanol 20 mg po/sl every 2 hrs prn (patient tolerated well) *  Dysphagia:  Continue to advance to goal as tolerated.  Patient and family are educated on importance of differentiating sputum from vomit.  (family is telling me he is vomiting but the time I spent with him it was sputum).  Nursing to give basin and document meticulously * Secretions: Scopolamine patch as directed        *Radiation dermatitis-dressing changes per Dr Pablo Ledger -clean neck with normal saline soaked sterile gauze, leave on 10 minutes and pat dry. Leave open to air during day. Apply silvadene to affected area at bedtime and cover with telfa dressing. Secure with gauze roll.  Palliative Prophylaxis:  Stool Softner: Senna-s-  One tablet BID-nursing to changed colostomy bag for accurate I&O     Care plan was discussed with Dr Coralyn Pear  Thank you for allowing the Palliative Medicine Team to assist in the care of this patient.   Time In: 1200 Time Out: 1300 Total Time 60 min Prolonged Time Billed  no     Greater than 50%  of this time was spent counseling and coordinating care related to the above assessment and plan.     Knox Royalty, NP  10/13/2014, 8:54 AM  Please contact Palliative Medicine Team phone at [phone] for questions and concerns.

## 2014-10-13 NOTE — Progress Notes (Addendum)
PT Cancellation Note  Patient Details Name: [name] MRN: [id number] DOB: Apr 11, 1955   Cancelled Treatment:    Reason Eval/Treat Not Completed: Medical issues which prohibited therapy (patient reports that he is too weak today, is spitting up blood and not ready. encouraged patient to just sit up on bed edge but declined.) Will return tomorrow.  [name] 10/13/2014, 10:57 AM [name] PT [phone]

## 2014-10-13 NOTE — Progress Notes (Signed)
Per order TF increased from 40 ml to 45 ml.

## 2014-10-13 NOTE — Progress Notes (Signed)
Per order TF increased 5 ml to 50 ml/hr.

## 2014-10-13 NOTE — Progress Notes (Addendum)
[facility] PROGRESS NOTE  AARISH ROCKERS NLZ:[id number] DOB: [birth year] DOA: 10/11/2014 PCP: Imelda Pillow, NP   HPI: [name] is a 59 y.o. 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 Has been receiving radiation to his neck, nearly finished with radiation course. He was intolerant to chemotherapy (anaphylaxis) so that was discontinued. Of late, the patient has been having increasing dysphagia progressing from solids now to liquids as well. He does complain of moderate throat pain, especially when talking or swallowing. Two days ago, he began having fevers up to 101 and a productive cough, was given augmentin by oncologist yesterday. Patient unable to swallow pills or tolerate PO intake so was admitted to the hospital.   Upon admission, chest x-ray reveals R side infiltrates, possibly d/t aspiration given severe dysphagia. Also of note, patient has severe scabbing and ulceration of his neck secondary to radiation therapy. There is now purulent discharge coming from wound bed and the area is causing patient significant discomfort. Patient denies CP, SOB, abdominal pain, or urinary complaints.   Subjective: Several episodes of vomiting last night. Otherwise tolerating tube feeds well. Regaining strength slowly with the incoming nutrition. Pain under slightly better control today, from 8/10 yesterday now to 6/10. C/o productive cough of brown sputum. Denies CP, SOB, abdominal pain.   Assessment/Plan: Probable Aspiration Pneumonia: Patient has remained afebrile since admission.  Blood cultures negative so far. Broad coverage IV abx have been started (levaquin, flagyl, and vancomycin). Labs reviewed, WBC's trending down to 4,500.  Breathing comfortably on room air. Continue supportive care with IV fluids.   Acute Radiation Dermatitis: Patient nearly finished with  radiation course for laryngeal cancer, presenting with severe scabbing and ulceration of the neck secondary to this. Wound care has been consulted due to purlulent discharge coming from wound. Recommending twice daily and PRN cleansing of the tissue with NS and gently patting dry to soften and eventually remove the dried serum (scabbing). Wound exudate will be managed with absorption and provision of wound contact with an antimicrobial textile (Dermatherapy) The antibiotics for PNA should cover for any infection at this site as well, cultures pending. Continue pain management with fentanyl patch and roxanol as needed.   Dysphagia: Most likely secondary to vast progression of laryngeal cancer, but cannot rule out other causes at this time such as esophageal stricture or radiation induced pharyngitis. SLP evaluated yesterday and is recommending he continue as NPO, and IR placed NG tube yesterday. Nutrition consulted to monitor tube feeding. Will monitor closely for refeeding syndrome. Monitor phos, mag, and BMP and supplement as needed. Suction is available at bedside to help manage his secretions. Plan is to re-evaluate swallow status early next week and discuss long-term plans for nutrition pending those results.    Squamous Cell Carcinoma of Larynx: Radiation oncology is following. Defer to Dr. Unknown Jim plan  Hypokalemia: Likely secondary to severe dysphagia and minimal PO intake the past week. Continue to replete with IVF and tube nutrition, monitor electrolytes closely. Will provide additional potassium supplementation today.   Anemia of Chronic Disease: Continue close monitoring of CBC, Hg=9.3.  No indication for transfusion at this time.   CAD: Hx of CABG '06, DES LCX 01/2013, DES SVG[facility]LAD 5/21/5 at [facility] Continue beta blocker. Recent echo showed 55% EF and no diastolic dysfunction.   Hx of MI: Anterior wall NSTEMI last year. Stable.   Anxiety: Continue Ativan as  needed, continue to hold home  meds as his anxiety appears well controlled at this time.   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 Stable, no acute changes. Started patient on senna bowel regimen as he has not had a BM for several days, will add additional agents as needed.   DVT Prophylaxis: Lovenox Code Status: Full code Family Communication: None Disposition Plan: Continue as inpatient, likely requiring monitoring over weekend and into early next week.   Consultants:  Interventional Radiology  Oncology  Pulmonology  Procedures:  NG Tube placement  Antibiotics:  Levaquin 10/11/2014 >>   Flagyl 10/11/2014 >>  Vancomycin 10/11/2014 >>  Objective: Filed Vitals:   10/13/14 0705  BP: 116/66  Pulse: 104  Temp: 98.2 F (36.8 C)  Resp: 20   No intake or output data in the 24 hours ending 10/13/14 0809 Filed Weights   10/12/14 1500  Weight: 66.225 kg (146 lb)   Exam:  General: NAD, underweight  HEENT: No scleral icterus, mucous membranes dry  Neck: No JVD, no cervical LAD  Cardiovascular: RRR, no m/r/g  Respiratory: Crackles at right base, unlabored respiratory effort  Abdomen: Soft, non tender, no masses, +BS, colostomy bag in place  Musculoskeletal: No LE edema, warm, full ROM in all 4  Neuro: Alert and oriented, non focal  Psych: Normal mood and affect, speech clear and appropriate  Skin: Extensive ulceration at base of neck with scabbing  Data Reviewed: Basic Metabolic Panel:  Recent Labs Lab 10/09/14 1138 10/11/14 1612 10/12/14 1140 10/13/14 0544  NA 136 139 136 139  K 4.3 3.7 3.1* 3.1*  CL 98* 104 105 105  CO2 30 31 29 29   GLUCOSE 118* 99 140* 126*  BUN 9 8 6  5*  CREATININE 0.82 0.62 0.73 0.71  CALCIUM 9.0 8.2* 7.9* 7.9*  MG  --   --   --  1.6*  PHOS  --   --   --  1.7*   Liver Function Tests:  Recent Labs Lab 10/09/14 1138 10/13/14 0544  AST 23 22  ALT 15* 11*  ALKPHOS 115 86  BILITOT 0.5 0.5  PROT 6.9 5.3*  ALBUMIN 3.4* 2.4*   No results  for input(s): LIPASE, AMYLASE in the last 168 hours. No results for input(s): AMMONIA in the last 168 hours. CBC:  Recent Labs Lab 10/09/14 1138 10/11/14 1612 10/12/14 1140 10/13/14 0544  WBC 10.6* 7.7 4.9 4.5  HGB 11.0* 9.2* 9.1* 9.3*  HCT 34.6* 28.6* 27.9* 29.2*  MCV 92.3 92.9 92.4 91.8  PLT 277 273 270 290   Cardiac Enzymes: No results for input(s): CKTOTAL, CKMB, CKMBINDEX, TROPONINI in the last 168 hours. BNP (last 3 results)  Recent Labs  08/05/14 0115  BNP 780.2*   ProBNP (last 3 results) No results for input(s): PROBNP in the last 8760 hours.  CBG:  Recent Labs Lab 10/09/14 1055  GLUCAP 104*   Recent Results (from the past 240 hour(s))  Culture, blood (routine x 2)     Status: None (Preliminary result)   Collection Time: 10/11/14  4:22 PM  Result Value Ref Range Status   Specimen Description BLOOD LEFT HAND  Final   Special Requests BOTTLES DRAWN AEROBIC AND ANAEROBIC 5 CC EA  Final   Culture   Final    NO GROWTH < 24 HOURS Performed at [facility]    Report Status PENDING  Incomplete  Culture, blood (routine x 2)     Status: None (Preliminary result)   Collection Time: 10/11/14  4:25 PM  Result Value Ref Range Status   Specimen Description BLOOD RIGHT HAND  Final   Special Requests BOTTLES DRAWN AEROBIC AND ANAEROBIC 5 CC EA  Final   Culture   Final    NO GROWTH < 24 HOURS Performed at [facility]    Report Status PENDING  Incomplete    Studies: Dg Chest 2 View  10/11/2014   CLINICAL DATA:  Cough. Laryngeal cancer. Recent perforated diverticulitis. Recent fever. Dysphagia.  EXAM: CHEST  2 VIEW  COMPARISON:  10/09/2014 chest radiograph.  FINDINGS: Right internal jugular Port-A-Cath terminates at the cavoatrial junction. Median sternotomy wires are stable in configuration and appear intact. Stable cardiomediastinal silhouette with normal heart size. No pneumothorax. No pleural effusion. No pulmonary edema. There is new patchy opacity  in the basilar right middle lobe and right lower lobe. There are healing subacute fractures of the anterior left fifth and sixth ribs.  IMPRESSION: 1. New patchy lung opacity in the basilar right middle lobe and basilar right lower lobe, suspicious for pneumonia. Recommend follow-up post treatment PA and lateral chest radiographs in 6-8 weeks. 2. Healing nondisplaced anterior left 5th and 6th rib fractures.   Electronically Signed   By: Ilona Sorrel M.D.   On: 10/11/2014 17:43   Dg Loyce Dys Tube Plc W/fl W/rad  10/12/2014   CLINICAL DATA:  Laryngeal cancer. Unsuccessful nasogastric tube attempts x2 on the inpatient floor. Requested fluoroscopy guided-nasogastric tube placement .  EXAM: NASO G TUBE PLACEMENT WITH FL AND WITH RAD  CONTRAST:  None.  FLUOROSCOPY TIME:  Fluoroscopy Time (in minutes and seconds): 1 minutes 30 seconds  Number of Acquired Images: 0 fluoroscopy exposure images. (1 image capture acquired)  COMPARISON:  10/11/2014 chest radiograph and 08/04/2014 chest CT.  FINDINGS: Under real-time fluoroscopic guidance, a 47 French nasogastric tube was placed through the left nostril into the proximal stomach, as verified by fluoroscopy. The nasogastric tube was externally secured in place by the technologist. The procedure was well tolerated, with no immediate complication.  IMPRESSION: Successful fluoroscopically guided nasogastric tube placement. The nasogastric tube is ready for use on the inpatient floor.   Electronically Signed   By: Ilona Sorrel M.D.   On: 10/12/2014 14:55   Scheduled Meds: . antiseptic oral rinse  7 mL Mouth Rinse BID  . enoxaparin (LOVENOX) injection  40 mg Subcutaneous Q24H  . fentaNYL  100 mcg Transdermal Q72H  . levofloxacin (LEVAQUIN) IV  750 mg Intravenous Q24H  . LORazepam  0.25 mg Intravenous TID  . metoprolol  2.5 mg Intravenous Q12H  . metronidazole  500 mg Intravenous Q8H  . pantoprazole (PROTONIX) IV  40 mg Intravenous Q24H  . potassium chloride  40 mEq Oral  Q6H  . silver sulfADIAZINE   Topical Daily  . vancomycin  750 mg Intravenous Q8H   Continuous Infusions: . dextrose 5 % and 0.9% NaCl 125 mL/hr at 10/13/14 0430  . feeding supplement (PIVOT 1.5 CAL) 55 mL/hr at 10/12/14 1742   Principal Problem:   Fever in adult Active Problems:   CAD-hx of CABG '06, DES LCX 01/2013, DES SVG-LAD 5/21/5 at [facility]   Ischemic cardiomyopathy, EF 45%   Fever, possible aspiration-treated with antibiotics   Squamous cell carcinoma of larynx   Cancer associated pain   Dysphagia   Laryngeal cancer  Time spent: [address] Student-PA [facility] If 7PM-7AM, please contact night-coverage at [email or web address], password [facility] 10/13/2014, 8:09 AM  LOS: 2 days    Addendum  I personally evaluated patient on 10/13/2014 and agree with the above findings. Mr. Hedman is a pleasant 59 year old gentleman with a past medical history of laryngeal cancer stage III (T3, N0, M0, squamous cell carcinoma, previously undergoing chemotherapy at the cancer center suffering cardiac arrest post treatment. He presented with complaints of dysphagia, reporting unable to take by mouth over the past week. On 10/12/2014 interventional radiology was consulted as he had NG tube placed fluoroscopically. Patient taught procedure well there were no immediate couple occasions as he was started on tube feeds. Palliative care was consulted as well. Labs were reviewed, and had Potassium of 3.1, Magnesium at 1.6 and phosphorus at 1.7. Will replace electrolytes. On exam he is awake and alert, following commands. Bibasilar crackles were present at bases. Scabs over his neck from radiation therapy stable. NG tube in place. Will work on his nutritional status, reassess swallow function next week. Continue emperic IV AB therapy for likely aspiration.

## 2014-10-13 NOTE — Progress Notes (Signed)
Pt complaining of feeling nauseous. Pt coughing of thick, tan tinged mucous.  30 ml of residual. NGT auscultated.  Zofran IV administered.  Will continue to monitor.

## 2014-10-13 NOTE — Progress Notes (Signed)
Pharmacy - Brief Note  Pharmacy is asked to dose phosphorous. Patient showing signs of refeeding syndrome with initiation of tube feeding. Repletion of K and Mag are underway. Phos 1.7. Na is wnl. SCr wnl.  Will give KPhos 42mmol IV over 6 hours and follow-up morning labs.   [name] Rabon, PharmD, pager [phone]. 10/13/2014,12:18 PM.

## 2014-10-13 NOTE — Progress Notes (Signed)
ANTIBIOTIC CONSULT NOTE - Follow up  Pharmacy Consult for vancomycin Indication: rule out sepsis  Allergies  Allergen Reactions  . Cetuximab Anaphylaxis    Cardiac arrest 08/02/2014    Patient Measurements:  Height: 5\' 7"  (170.2 cm) Weight: 146 lb (66.225 kg) IBW/kg (Calculated) : 66.1 Vital Signs: Temp: 98.2 F (36.8 C) (09/30 0705) Temp Source: Oral (09/30 0705) BP: 116/66 mmHg (09/30 0705) Pulse Rate: 104 (09/30 0705) Intake/Output from previous day:   Intake/Output from this shift:    Labs:  Recent Labs  10/11/14 1612 10/12/14 1140 10/13/14 0544  WBC 7.7 4.9 4.5  HGB 9.2* 9.1* 9.3*  PLT 273 270 290  CREATININE 0.62 0.73 0.71   Estimated Creatinine Clearance: 90.7 mL/min (by C-G formula based on Cr of 0.71).  Recent Labs  10/13/14 0850  [facility] 16     Microbiology: Recent Results (from the past 720 hour(s))  Culture, blood (routine x 2)     Status: None (Preliminary result)   Collection Time: 10/11/14  4:22 PM  Result Value Ref Range Status   Specimen Description BLOOD LEFT HAND  Final   Special Requests BOTTLES DRAWN AEROBIC AND ANAEROBIC 5 CC EA  Final   Culture   Final    NO GROWTH < 24 HOURS Performed at [facility]    Report Status PENDING  Incomplete  Culture, blood (routine x 2)     Status: None (Preliminary result)   Collection Time: 10/11/14  4:25 PM  Result Value Ref Range Status   Specimen Description BLOOD RIGHT HAND  Final   Special Requests BOTTLES DRAWN AEROBIC AND ANAEROBIC 5 CC EA  Final   Culture   Final    NO GROWTH < 24 HOURS Performed at [facility]    Report Status PENDING  Incomplete  Wound culture     Status: None (Preliminary result)   Collection Time: 10/12/14 11:59 AM  Result Value Ref Range Status   Specimen Description WOUND NECK  Final   Special Requests NONE  Final   Gram Stain   Final    RARE WBC PRESENT, PREDOMINANTLY PMN NO SQUAMOUS EPITHELIAL CELLS SEEN RARE GRAM POSITIVE COCCI IN  PAIRS Performed at [facility]    Culture PENDING  Incomplete   Report Status PENDING  Incomplete   Assessment: Patient is a 59 y.o M with hx laryngeal cancer and recent perforated diverticulitis (s/p colostomy) who was placed on augmentin by his oncologist on 9/27 for fever, but has not started d/t dysphagia.  He presents to [facility] from the cancer center on 9/28 with c/o fever and dysphagia.  To start vancomycin for fever, suspected cellulitis, and bronchitis.  Afebrile. CrCl ~90.   9/28 >> azithro >> 9/28 9/28 >> vanc >> 9/28 >> Levofloxacin >> 9/28 >> Metronidazole >>  9/28 blood x2: ngtd 9/29 wound: rare GPC  Dose changes/Levels: 9/30: VT = 69mcg/ml on 750mg  q8h  Goal of Therapy:  Vancomycin trough level 15-20 mcg/ml  Appropriate antibiotic dosing for renal function; eradication of infection  Plan:  Cont Vanc 750mg  IV q8h Cont Levaquin IV  Cont Flagyl IV Recheck Vanc trough when appropriate.  Follow up renal fxn, culture results, and clinical course.  [name] Rabon, PharmD, pager [phone]. 10/13/2014,9:54 AM.

## 2014-10-14 ENCOUNTER — Ambulatory Visit: Payer: Self-pay

## 2014-10-14 ENCOUNTER — Inpatient Hospital Stay (HOSPITAL_COMMUNITY): Payer: Medicaid Other

## 2014-10-14 DIAGNOSIS — E43 Unspecified severe protein-calorie malnutrition: Secondary | ICD-10-CM

## 2014-10-14 LAB — PHOSPHORUS: PHOSPHORUS: 2.5 mg/dL (ref 2.5–4.6)

## 2014-10-14 LAB — GLUCOSE, CAPILLARY
GLUCOSE-CAPILLARY: 103 mg/dL — AB (ref 65–99)
GLUCOSE-CAPILLARY: 109 mg/dL — AB (ref 65–99)
GLUCOSE-CAPILLARY: 112 mg/dL — AB (ref 65–99)
GLUCOSE-CAPILLARY: 118 mg/dL — AB (ref 65–99)
GLUCOSE-CAPILLARY: 119 mg/dL — AB (ref 65–99)
Glucose-Capillary: 111 mg/dL — ABNORMAL HIGH (ref 65–99)
Glucose-Capillary: 119 mg/dL — ABNORMAL HIGH (ref 65–99)

## 2014-10-14 LAB — BASIC METABOLIC PANEL
Anion gap: 7 (ref 5–15)
CALCIUM: 7.8 mg/dL — AB (ref 8.9–10.3)
CO2: 27 mmol/L (ref 22–32)
CREATININE: 0.62 mg/dL (ref 0.61–1.24)
Chloride: 105 mmol/L (ref 101–111)
GFR calc Af Amer: >60 mL/min (ref >60)
GLUCOSE: 144 mg/dL — AB (ref 65–99)
Potassium: 3.2 mmol/L — ABNORMAL LOW (ref 3.5–5.1)
SODIUM: 139 mmol/L (ref 135–145)

## 2014-10-14 LAB — MAGNESIUM: MAGNESIUM: 1.8 mg/dL (ref 1.7–2.4)

## 2014-10-14 MED ORDER — POTASSIUM CHLORIDE 10 MEQ/100ML IV SOLN
10.0000 meq | INTRAVENOUS | Status: AC
  Administered 2014-10-14 (×2): 10 meq via INTRAVENOUS
  Filled 2014-10-14 (×2): qty 100

## 2014-10-14 MED ORDER — VITAMINS A & D EX OINT
TOPICAL_OINTMENT | CUTANEOUS | Status: AC
  Administered 2014-10-14: 5
  Filled 2014-10-14: qty 5

## 2014-10-14 MED ORDER — POTASSIUM PHOSPHATES 15 MMOLE/5ML IV SOLN
10.0000 mmol | Freq: Once | INTRAVENOUS | Status: AC
  Administered 2014-10-14: 10 mmol via INTRAVENOUS
  Filled 2014-10-14: qty 3.33

## 2014-10-14 MED ORDER — AMOXICILLIN-POT CLAVULANATE 400-57 MG/5ML PO SUSR
500.0000 mg | Freq: Three times a day (TID) | ORAL | Status: DC
  Administered 2014-10-14 – 2014-10-18 (×12): 500 mg
  Filled 2014-10-14 (×2): qty 6.3
  Filled 2014-10-14: qty 10
  Filled 2014-10-14 (×3): qty 6.3
  Filled 2014-10-14: qty 10
  Filled 2014-10-14 (×7): qty 6.3
  Filled 2014-10-14 (×2): qty 10
  Filled 2014-10-14 (×7): qty 6.3

## 2014-10-14 MED ORDER — POTASSIUM CHLORIDE 10 MEQ/100ML IV SOLN: 10.0000 meq | INTRAVENOUS | Status: DC

## 2014-10-14 MED ORDER — SODIUM CHLORIDE 0.9 % IV SOLN
INTRAVENOUS | Status: DC
  Administered 2014-10-14: 1000 mL via INTRAVENOUS
  Administered 2014-10-14: 23:00:00 via INTRAVENOUS
  Administered 2014-10-15: 1000 mL via INTRAVENOUS
  Administered 2014-10-16 – 2014-10-19 (×4): via INTRAVENOUS

## 2014-10-14 MED ORDER — POTASSIUM CHLORIDE 20 MEQ/15ML (10%) PO SOLN
40.0000 meq | Freq: Four times a day (QID) | ORAL | Status: AC
  Administered 2014-10-14 (×2): 40 meq via ORAL
  Filled 2014-10-14 (×3): qty 30

## 2014-10-14 NOTE — Progress Notes (Signed)
Per order TF increased from 20 ml to 30 ml. 100 ml of free water administered.  10 ml of residual.

## 2014-10-14 NOTE — Progress Notes (Signed)
Anterior neck cleansed with NS,soaked sterile gauze applied to site for 10 minutes,site dried. Patient tolerated the procedure.

## 2014-10-14 NOTE — Progress Notes (Signed)
PT Cancellation Note  Patient Details Name: [name] MRN: [id number] DOB: 12/05/1955   Cancelled Treatment:    Reason Eval/Treat Not Completed: Medical issues which prohibited therapy (patient states that he is not ready today. wants to try tomorrow .)   [name] 10/14/2014, 4:52 PM

## 2014-10-14 NOTE — Progress Notes (Signed)
Gastric residual was 10cc. TF infusing at 30cc/hr. Will continue to monitor.

## 2014-10-14 NOTE — Progress Notes (Signed)
Pharmacy - Brief Note for Phosphorus Replacement  Pharmacy is asked to dose phosphorous. Patient showing signs of refeeding syndrome with initiation of tube feeding.    - Phos improved from 1.7 --> 2.5 today following KPhos 15 mmol IV administered 9/30. Phos at low end of therapeutic range and would like goal Phos closer to 4 in patient at risk for refeeding.  Also of note: - K low today at 3.2 and is currently being replaced by MD.   - Mag is 1.8 after 1g magnesium sulfate IV yesterday.  Plan: 1.  KPhos 10 mmol IV today over 6 hours.  Check Phosphorus level tomorrow morning. 2.  Consider replacing Magnesium again today to achieve goal of 2 in a patient at risk for refeeding.  Hershal Coria, PharmD, BCPS Pager: [phone] 10/14/2014 9:08 AM

## 2014-10-14 NOTE — Progress Notes (Signed)
TF incrreased to 40 cc/hr. Will continue to monitor.

## 2014-10-14 NOTE — Progress Notes (Signed)
ANTIBIOTIC CONSULT NOTE  Pharmacy Consult for Augmentin Indication: probable aspiration pneumonia  Allergies  Allergen Reactions  . Cetuximab Anaphylaxis    Cardiac arrest 08/02/2014    Patient Measurements:  Height: 5\' 7"  (170.2 cm) Weight: 146 lb 12.8 oz (66.588 kg) IBW/kg (Calculated) : 66.1 Vital Signs: Temp: 98.2 F (36.8 C) (09/30 2016) Temp Source: Oral (09/30 2016) BP: 125/68 mmHg (09/30 2016) Pulse Rate: 83 (09/30 2016) Intake/Output from previous day: 09/30 0701 - 10/01 0700 In: 98.2 [P.O.:98.2] Out: 500 [Urine:200; Stool:300] Intake/Output from this shift: Total I/O In: -  Out: 500 [Urine:500]  Labs:  Recent Labs  10/11/14 1612 10/12/14 1140 10/13/14 0544 10/14/14 0508  WBC 7.7 4.9 4.5  --   HGB 9.2* 9.1* 9.3*  --   PLT 273 270 290  --   CREATININE 0.62 0.73 0.71 0.62   Estimated Creatinine Clearance: 90.7 mL/min (by C-G formula based on Cr of 0.62).  Recent Labs  10/13/14 0850  [facility] 16     Microbiology: Recent Results (from the past 720 hour(s))  Culture, blood (routine x 2)     Status: None (Preliminary result)   Collection Time: 10/11/14  4:22 PM  Result Value Ref Range Status   Specimen Description BLOOD LEFT HAND  Final   Special Requests BOTTLES DRAWN AEROBIC AND ANAEROBIC 5 CC EA  Final   Culture   Final    NO GROWTH 2 DAYS Performed at [facility]    Report Status PENDING  Incomplete  Culture, blood (routine x 2)     Status: None (Preliminary result)   Collection Time: 10/11/14  4:25 PM  Result Value Ref Range Status   Specimen Description BLOOD RIGHT HAND  Final   Special Requests BOTTLES DRAWN AEROBIC AND ANAEROBIC 5 CC EA  Final   Culture   Final    NO GROWTH 2 DAYS Performed at [facility]    Report Status PENDING  Incomplete  Wound culture     Status: None (Preliminary result)   Collection Time: 10/12/14 11:59 AM  Result Value Ref Range Status   Specimen Description WOUND NECK  Final   Special  Requests NONE  Final   Gram Stain   Final    RARE WBC PRESENT, PREDOMINANTLY PMN NO SQUAMOUS EPITHELIAL CELLS SEEN RARE GRAM POSITIVE COCCI IN PAIRS Performed at [facility]    Culture   Final    Culture reincubated for better growth Performed at [facility]    Report Status PENDING  Incomplete   Assessment: Patient is a 59 y.o M with hx laryngeal cancer and recent perforated diverticulitis (s/p colostomy) who was placed on augmentin by his oncologist on 9/27 for fever, but has not started d/t dysphagia.  He presents to [facility] from the cancer center on 9/28 with c/o fever and dysphagia.  Started on broad spectrum antibiotics.  Narrowing to Augmentin today per tube due to continued dysphagia for probable aspiration pneumonia.  Afebrile. CrCl ~90. WBC WNL  9/28 >> azithro >> 9/28 9/28 >> vanc >> 10/1 9/28 >> Levofloxacin >> 10/1 9/28 >> Metronidazole >> 10/1 10/1 >> Augmentin  9/28 blood x2: ngtd 9/29 wound: rare GPC  Goal of Therapy:  Appropriate antibiotic dosing for renal function; eradication of infection  Plan:  Augmentin 500 mg per tube q8h Renal function has been stable and need for further dosage adjustment appears unlikely at present.  Will sign off at this time.  Please reconsult if a change in clinical  status warrants re-evaluation of dosage.  Hershal Coria, PharmD, BCPS Pager: [phone] 10/14/2014 7:51 AM

## 2014-10-14 NOTE — Progress Notes (Signed)
[facility] PROGRESS NOTE  [name]:[id number] DOB: March 28, 1955 DOA: 10/11/2014 PCP: Imelda Pillow, NP   HPI: [name] is a 59 y.o. 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 Has been receiving radiation to his neck, nearly finished with radiation course. He was intolerant to chemotherapy (anaphylaxis) so that was discontinued. Of late, the patient has been having increasing dysphagia progressing from solids now to liquids as well. He does complain of moderate throat pain, especially when talking or swallowing. Two days ago, he began having fevers up to 101 and a productive cough, was given augmentin by oncologist yesterday. Patient unable to swallow pills or tolerate PO intake so was admitted to the hospital.   Upon admission, chest x-ray reveals R side infiltrates, possibly d/t aspiration given severe dysphagia. Also of note, patient has severe scabbing and ulceration of his neck secondary to radiation therapy. There is now purulent discharge coming from wound bed and the area is causing patient significant discomfort. Patient denies CP, SOB, abdominal pain, or urinary complaints.   Subjective: Several episodes of vomiting last night. Otherwise tolerating tube feeds well. Regaining strength slowly with the incoming nutrition. Pain under slightly better control today, from 8/10 yesterday now to 6/10. C/o productive cough of brown sputum. Denies CP, SOB, abdominal pain.   Assessment/Plan: Probable Aspiration Pneumonia: Patient has remained afebrile since admission.  Blood cultures negative so far. Initially treated with broad coverage IV abx have been started (levaquin, flagyl, and vancomycin). Will transition to Augmentin per tube since he has been afebrile and hemodynamically stable for the past 48 hours.   Acute Radiation Dermatitis: Patient nearly  finished with radiation course for laryngeal cancer, presenting with severe scabbing and ulceration of the neck secondary to this. Wound care has been consulted due to purlulent discharge coming from wound. Recommending twice daily and PRN cleansing of the tissue with NS and gently patting dry to soften and eventually remove the dried serum (scabbing). Wound exudate will be managed with absorption and provision of wound contact with an antimicrobial textile (Dermatherapy) The antibiotics for PNA should cover for any infection at this site as well, cultures pending. Continue pain management with fentanyl patch and roxanol as needed.   Dysphagia: He presents with dysphagia having history of laryngeal cancer. NG tube was placed on 10/12/2014. Plan reassess swallow function early next week. It is possible he may require PEG tube placement/   Squamous Cell Carcinoma of Larynx: Radiation oncology is following, plan to complete his radiation therapy.   Hypokalemia: Likely secondary to severe dysphagia and minimal PO intake the past week. AM labs showing K of 3.2, will receive replacement today with Kdur peer tube.   Hypomagnesemia. After receiving replacement Mg improved to 1.8 on am labs  Hypophosphatemia. His phosphorus improved to 3.5 after Iv supplementation on 10/13/2014    DVT Prophylaxis: Lovenox Code Status: Full code Family Communication: None Disposition Plan: Continue as inpatient, likely requiring monitoring over weekend and into early next week.   Consultants:  Interventional Radiology  Oncology  Pulmonology  Procedures:  NG Tube placement  Antibiotics:  Levaquin 10/11/2014 >> 10/14/2014  Flagyl 10/11/2014 >> 10/14/2014  Vancomycin 10/11/2014 >>10/14/2014  Augmentin  Objective: Filed Vitals:   10/14/14 0936  BP: 136/66  Pulse:   Temp:   Resp:     Intake/Output Summary (Last 24 hours) at 10/14/14 1316 Last data filed at 10/14/14  1235  Gross per 24 hour  Intake   98.2  ml  Output   1290 ml  Net -1191.8 ml   Filed Weights   10/12/14 1500 10/13/14 2016  Weight: 66.225 kg (146 lb) 66.588 kg (146 lb 12.8 oz)   Exam:  General: NAD, underweight  HEENT: No scleral icterus, mucous membranes dry  Neck: No JVD, no cervical LAD  Cardiovascular: RRR, no m/r/g  Respiratory: Crackles at right base, unlabored respiratory effort  Abdomen: Soft, non tender, no masses, +BS, colostomy bag in place  Musculoskeletal: No LE edema, warm, full ROM in all 4  Neuro: Alert and oriented, non focal  Psych: Normal mood and affect, speech clear and appropriate  Skin: Extensive ulceration at base of neck with scabbing  Data Reviewed: Basic Metabolic Panel:  Recent Labs Lab 10/09/14 1138 10/11/14 1612 10/12/14 1140 10/13/14 0544 10/14/14 0508  NA 136 139 136 139 139  K 4.3 3.7 3.1* 3.1* 3.2*  CL 98* 104 105 105 105  CO2 30 31 29 29 27   GLUCOSE 118* 99 140* 126* 144*  BUN 9 8 6  5* <5*  CREATININE 0.82 0.62 0.73 0.71 0.62  CALCIUM 9.0 8.2* 7.9* 7.9* 7.8*  MG  --   --   --  1.6* 1.8  PHOS  --   --   --  1.7* 2.5   Liver Function Tests:  Recent Labs Lab 10/09/14 1138 10/13/14 0544  AST 23 22  ALT 15* 11*  ALKPHOS 115 86  BILITOT 0.5 0.5  PROT 6.9 5.3*  ALBUMIN 3.4* 2.4*   No results for input(s): LIPASE, AMYLASE in the last 168 hours. No results for input(s): AMMONIA in the last 168 hours. CBC:  Recent Labs Lab 10/09/14 1138 10/11/14 1612 10/12/14 1140 10/13/14 0544  WBC 10.6* 7.7 4.9 4.5  HGB 11.0* 9.2* 9.1* 9.3*  HCT 34.6* 28.6* 27.9* 29.2*  MCV 92.3 92.9 92.4 91.8  PLT 277 273 270 290   Cardiac Enzymes: No results for input(s): CKTOTAL, CKMB, CKMBINDEX, TROPONINI in the last 168 hours. BNP (last 3 results)  Recent Labs  08/05/14 0115  BNP 780.2*   ProBNP (last 3 results) No results for input(s): PROBNP in the last 8760 hours.  CBG:  Recent Labs Lab 10/13/14 2005 10/14/14 0017 10/14/14 0401 10/14/14 0730  10/14/14 1141  GLUCAP 102* 118* 111* 109* 119*   Recent Results (from the past 240 hour(s))  Culture, blood (routine x 2)     Status: None (Preliminary result)   Collection Time: 10/11/14  4:22 PM  Result Value Ref Range Status   Specimen Description BLOOD LEFT HAND  Final   Special Requests BOTTLES DRAWN AEROBIC AND ANAEROBIC 5 CC EA  Final   Culture   Final    NO GROWTH 2 DAYS Performed at [facility]    Report Status PENDING  Incomplete  Culture, blood (routine x 2)     Status: None (Preliminary result)   Collection Time: 10/11/14  4:25 PM  Result Value Ref Range Status   Specimen Description BLOOD RIGHT HAND  Final   Special Requests BOTTLES DRAWN AEROBIC AND ANAEROBIC 5 CC EA  Final   Culture   Final    NO GROWTH 2 DAYS Performed at [facility]    Report Status PENDING  Incomplete  Wound culture     Status: None (Preliminary result)   Collection Time: 10/12/14 11:59 AM  Result Value Ref Range Status   Specimen Description WOUND NECK  Final   Special Requests NONE  Final   Gram Stain   Final    RARE WBC PRESENT, PREDOMINANTLY PMN NO SQUAMOUS EPITHELIAL CELLS SEEN RARE GRAM POSITIVE COCCI IN PAIRS Performed at [facility]    Culture   Final    MODERATE STAPHYLOCOCCUS AUREUS Note: RIFAMPIN AND GENTAMICIN SHOULD NOT BE USED AS SINGLE DRUGS FOR TREATMENT OF STAPH INFECTIONS. Performed at [facility]    Report Status PENDING  Incomplete    Studies: Dg Chest 2 View  10/14/2014   CLINICAL DATA:  Aspiration.  EXAM: CHEST  2 VIEW  COMPARISON:  Radiograph 10/11/2014  FINDINGS: Sternotomy wires overlie normal cardiac silhouette. Endotracheal tube extends the stomach. RIGHT port in place. Lungs are hyperinflated. Small bilateral pleural effusions.  IMPRESSION: 1. Support apparatus appears in good position. 2. Small bilateral pleural effusions.   Electronically Signed   By: Suzy Bouchard M.D.   On: 10/14/2014 09:51   Dg Loyce Dys Tube Plc W/fl  W/rad  10/12/2014   CLINICAL DATA:  Laryngeal cancer. Unsuccessful nasogastric tube attempts x2 on the inpatient floor. Requested fluoroscopy guided-nasogastric tube placement .  EXAM: NASO G TUBE PLACEMENT WITH FL AND WITH RAD  CONTRAST:  None.  FLUOROSCOPY TIME:  Fluoroscopy Time (in minutes and seconds): 1 minutes 30 seconds  Number of Acquired Images: 0 fluoroscopy exposure images. (1 image capture acquired)  COMPARISON:  10/11/2014 chest radiograph and 08/04/2014 chest CT.  FINDINGS: Under real-time fluoroscopic guidance, a 50 French nasogastric tube was placed through the left nostril into the proximal stomach, as verified by fluoroscopy. The nasogastric tube was externally secured in place by the technologist. The procedure was well tolerated, with no immediate complication.  IMPRESSION: Successful fluoroscopically guided nasogastric tube placement. The nasogastric tube is ready for use on the inpatient floor.   Electronically Signed   By: Ilona Sorrel M.D.   On: 10/12/2014 14:55   Scheduled Meds: . amoxicillin-clavulanate  500 mg Per Tube 3 times per day  . antiseptic oral rinse  7 mL Mouth Rinse BID  . enoxaparin (LOVENOX) injection  40 mg Subcutaneous Q24H  . fentaNYL  100 mcg Transdermal Q72H  . free water  100 mL Per Tube 4 times per day  . LORazepam  0.25 mg Intravenous TID  . metoprolol  2.5 mg Intravenous Q12H  . pantoprazole (PROTONIX) IV  40 mg Intravenous Q24H  . potassium chloride  40 mEq Oral Q6H  . potassium phosphate IVPB (mmol)  10 mmol Intravenous Once  . scopolamine  1 patch Transdermal Q72H  . sennosides  10 mL Oral BID  . silver sulfADIAZINE   Topical Daily   Continuous Infusions: . sodium chloride 1,000 mL (10/14/14 0747)  . feeding supplement (VITAL 1.5 CAL) 1,000 mL (10/14/14 1235)   Principal Problem:   Fever in adult Active Problems:   CAD-hx of CABG '06, DES LCX 01/2013, DES SVG-LAD 5/21/5 at [facility]   Ischemic cardiomyopathy, EF 45%   Fever, possible  aspiration-treated with antibiotics   Squamous cell carcinoma of larynx   Cancer associated pain   Dysphagia   Laryngeal cancer   Protein-calorie malnutrition, severe   Palliative care encounter   DNR (do not resuscitate) discussion   Pain in throat   Increased oropharyngeal secretions  Time spent: 20  Kelvin Cellar, MD [facility] If 7PM-7AM, please contact night-coverage at [email or web address], password [facility] 10/14/2014, 1:16 PM  LOS: 3 days

## 2014-10-15 LAB — GLUCOSE, CAPILLARY
GLUCOSE-CAPILLARY: 105 mg/dL — AB (ref 65–99)
Glucose-Capillary: 110 mg/dL — ABNORMAL HIGH (ref 65–99)
Glucose-Capillary: 116 mg/dL — ABNORMAL HIGH (ref 65–99)
Glucose-Capillary: 105 mg/dL — ABNORMAL HIGH (ref 65–99)
Glucose-Capillary: 104 mg/dL — ABNORMAL HIGH (ref 65–99)

## 2014-10-15 LAB — BASIC METABOLIC PANEL
ANION GAP: 1 — AB (ref 5–15)
CALCIUM: 7.9 mg/dL — AB (ref 8.9–10.3)
CO2: 28 mmol/L (ref 22–32)
CREATININE: 0.58 mg/dL — AB (ref 0.61–1.24)
Chloride: 107 mmol/L (ref 101–111)
GFR calc Af Amer: >60 mL/min (ref >60)
GLUCOSE: 120 mg/dL — AB (ref 65–99)
Potassium: 3.7 mmol/L (ref 3.5–5.1)
Sodium: 136 mmol/L (ref 135–145)

## 2014-10-15 LAB — WOUND CULTURE

## 2014-10-15 LAB — PHOSPHORUS: PHOSPHORUS: 2.9 mg/dL (ref 2.5–4.6)

## 2014-10-15 MED ORDER — MAGNESIUM SULFATE IN D5W 10-5 MG/ML-% IV SOLN
1.0000 g | Freq: Once | INTRAVENOUS | Status: AC
  Administered 2014-10-15: 1 g via INTRAVENOUS
  Filled 2014-10-15: qty 100

## 2014-10-15 MED ORDER — POTASSIUM PHOSPHATES 15 MMOLE/5ML IV SOLN
10.0000 mmol | Freq: Once | INTRAVENOUS | Status: AC
  Administered 2014-10-15: 10 mmol via INTRAVENOUS
  Filled 2014-10-15: qty 3.33

## 2014-10-15 NOTE — Evaluation (Signed)
Physical Therapy Evaluation Patient Details Name: DINA MOBLEY MRN: [id number] DOB: 10/01/1955 Today's Date: 10/15/2014   History of Present Illness  59 yo male admitted with fever, acute radiation burn of neck. Hx of laryngeal cancer, colostomy, CABG, HTN, MI  Clinical Impression  On eval, pt was Min guard assist for mobility-walked ~200 feet with RW. Pt tolerated activity well. Could likely progress to ambulation with RW if pt is willing. Do not anticipate any follow up PT needs at discharge.     Follow Up Recommendations No PT follow up    Equipment Recommendations  None recommended by PT (pt states he has RW already)    Recommendations for Other Services       Precautions / Restrictions Precautions Precautions: Fall Restrictions Weight Bearing Restrictions: No      Mobility  Bed Mobility Overal bed mobility: Modified Independent                Transfers Overall transfer level: Modified independent                  Ambulation/Gait Ambulation/Gait assistance: Min guard Ambulation Distance (Feet): 200 Feet Assistive device: Rolling walker (2 wheeled) Gait Pattern/deviations: Step-through pattern;Decreased stride length     General Gait Details: slow gait speed. close guard for safety  Stairs            Wheelchair Mobility    Modified Rankin (Stroke Patients Only)       Balance Overall balance assessment: Needs assistance         Standing balance support: During functional activity Standing balance-Leahy Scale: Fair                               Pertinent Vitals/Pain Pain Assessment: Faces Faces Pain Scale: Hurts little more Pain Location: neck Pain Descriptors / Indicators: Sore Pain Intervention(s): Monitored during session    Home Living Family/patient expects to be discharged to:: Private residence   Available Help at Discharge: Family Type of Home: House Home Access: Stairs to enter Entrance  Stairs-Rails: None Technical brewer of Steps: 3 Home Layout: Multi-level Home Equipment: Environmental consultant - 2 wheels      Prior Function Level of Independence: Independent               Hand Dominance        Extremity/Trunk Assessment   Upper Extremity Assessment: Overall WFL for tasks assessed           Lower Extremity Assessment: Generalized weakness      Cervical / Trunk Assessment: Normal  Communication   Communication: No difficulties  Cognition Arousal/Alertness: Awake/alert Behavior During Therapy: WFL for tasks assessed/performed Overall Cognitive Status: Within Functional Limits for tasks assessed                      General Comments      Exercises        Assessment/Plan    PT Assessment Patient needs continued PT services  PT Diagnosis Difficulty walking;Generalized weakness;Acute pain   PT Problem List Decreased mobility;Decreased activity tolerance;Pain  PT Treatment Interventions Gait training;Functional mobility training;Therapeutic activities;Patient/family education;Therapeutic exercise;Balance training   PT Goals (Current goals can be found in the Care Plan section) Acute Rehab PT Goals Patient Stated Goal: less pain PT Goal Formulation: With patient Time For Goal Achievement: 10/29/14 Potential to Achieve Goals: Good    Frequency Min 3X/week   Barriers to discharge  Co-evaluation               End of Session   Activity Tolerance: Patient tolerated treatment well Patient left: in bed;with call bell/phone within reach           Time: 1502-1513 PT Time Calculation (min) (ACUTE ONLY): 11 min   Charges:   PT Evaluation $Initial PT Evaluation Tier I: 1 Procedure     PT G Codes:        Weston Anna, MPT Pager: [phone]

## 2014-10-15 NOTE — Progress Notes (Signed)
TF infusing at 60cc/hr,gastric residual was 10 cc,denies nausea. Will continue to monitor.

## 2014-10-15 NOTE — Progress Notes (Addendum)
[facility] PROGRESS NOTE  [name] JKQ:[id number] DOB: 1955-01-14 DOA: 10/11/2014 PCP: Imelda Pillow, NP   HPI: [name] is a 59 y.o. 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 Has been receiving radiation to his neck, nearly finished with radiation course. He was intolerant to chemotherapy (anaphylaxis) so that was discontinued. Of late, the patient has been having increasing dysphagia progressing from solids now to liquids as well. He does complain of moderate throat pain, especially when talking or swallowing. Two days ago, he began having fevers up to 101 and a productive cough, was given augmentin by oncologist yesterday. Patient unable to swallow pills or tolerate PO intake so was admitted to the hospital.   Upon admission, chest x-ray reveals R side infiltrates, possibly d/t aspiration given severe dysphagia. Also of note, patient has severe scabbing and ulceration of his neck secondary to radiation therapy. There is now purulent discharge coming from wound bed and the area is causing patient significant discomfort. Patient denies CP, SOB, abdominal pain, or urinary complaints.   Subjective: Several episodes of vomiting last night. Otherwise tolerating tube feeds well. Regaining strength slowly with the incoming nutrition. Pain under slightly better control today, from 8/10 yesterday now to 6/10. C/o productive cough of brown sputum. Denies CP, SOB, abdominal pain.   Assessment/Plan: Probable Aspiration Pneumonia: Patient has remained afebrile since admission.  Blood cultures negative so far. Initially treated with broad coverage IV abx have been started (levaquin, flagyl, and vancomycin).  He was transitioned to Augmentin per tube on 10/14/2014, remains nontoxic and  afebrile. Last CXR done on 10/14/2014 remains stable.  Acute Radiation Dermatitis:  Patient nearly finished with radiation course for laryngeal cancer, presenting with severe scabbing and ulceration of the neck secondary to this. Wound care has been consulted due to purlulent discharge coming from wound. Recommending twice daily and PRN cleansing of the tissue with NS and gently patting dry to soften and eventually remove the dried serum (scabbing). Wound exudate will be managed with absorption and provision of wound contact with an antimicrobial textile (Dermatherapy) The antibiotics for PNA should cover for any infection at this site as well, cultures pending. Continue pain management with fentanyl patch and roxanol as needed.  Wound continues to show daily improvement  Dysphagia: He presents with dysphagia having history of laryngeal cancer. NG tube was placed on 10/12/2014. Plan reassess swallow function early next week. It is possible he may require PEG tube placement Plan to have SLP reassess in AM.   Squamous Cell Carcinoma of Larynx: Radiation oncology is following, plan to complete his radiation therapy.   Hypokalemia: Likely secondary to severe dysphagia and minimal PO intake the past week. Potassium improving to 3.7 after replacement.   Hypomagnesemia. Will give 1 g of IV magnesium, replace magnesium level in am.   Hypophosphatemia. His phosphorus improved to 2.9 after replacement  Severe Protein Calorie Malnutrition -Evidence by having wt of 68.5 Kg -Will continue to replace electrolytes and tube feeds.   DVT Prophylaxis: Lovenox Code Status: Full code Family Communication: None Disposition Plan: Continue as inpatient, likely requiring monitoring over weekend and into early next week.   Consultants:  Interventional Radiology  Oncology  Pulmonology  Procedures:  NG Tube placement  Antibiotics:  Levaquin 10/11/2014 >> 10/14/2014  Flagyl 10/11/2014 >> 10/14/2014  Vancomycin 10/11/2014 >>10/14/2014  Augmentin  Objective: Filed Vitals:   10/15/14  0936  BP: 134/70  Pulse: 90  Temp:   Resp:     Intake/Output Summary (Last 24 hours) at 10/15/14 1223 Last data filed at 10/15/14 0956  Gross per 24 hour  Intake    875 ml  Output   2315 ml  Net  -1440 ml   Filed Weights   10/12/14 1500 10/13/14 2016 10/15/14 0620  Weight: 66.225 kg (146 lb) 66.588 kg (146 lb 12.8 oz) 68.5 kg (151 lb 0.2 oz)   Exam:  General: NAD, underweight, cachectic  HEENT: No scleral icterus, mucous membranes dry  Neck: No JVD, no cervical LAD  Cardiovascular: RRR, no m/r/g  Respiratory: Crackles at right base, unlabored respiratory effort  Abdomen: Soft, non tender, no masses, +BS, colostomy bag in place  Musculoskeletal: No LE edema, warm, full ROM in all 4, sarcopenic.   Neuro: Alert and oriented, non focal  Psych: Normal mood and affect, speech clear and appropriate  Skin: Extensive ulceration at base of neck with scabbing  Data Reviewed: Basic Metabolic Panel:  Recent Labs Lab 10/11/14 1612 10/12/14 1140 10/13/14 0544 10/14/14 0508 10/15/14 0430 10/15/14 0750  NA 139 136 139 139  --  136  K 3.7 3.1* 3.1* 3.2*  --  3.7  CL 104 105 105 105  --  107  CO2 31 29 29 27   --  28  GLUCOSE 99 140* 126* 144*  --  120*  BUN 8 6 5* <5*  --  <5*  CREATININE 0.62 0.73 0.71 0.62  --  0.58*  CALCIUM 8.2* 7.9* 7.9* 7.8*  --  7.9*  MG  --   --  1.6* 1.8  --   --   PHOS  --   --  1.7* 2.5 2.9  --    Liver Function Tests:  Recent Labs Lab 10/09/14 1138 10/13/14 0544  AST 23 22  ALT 15* 11*  ALKPHOS 115 86  BILITOT 0.5 0.5  PROT 6.9 5.3*  ALBUMIN 3.4* 2.4*   No results for input(s): LIPASE, AMYLASE in the last 168 hours. No results for input(s): AMMONIA in the last 168 hours. CBC:  Recent Labs Lab 10/09/14 1138 10/11/14 1612 10/12/14 1140 10/13/14 0544  WBC 10.6* 7.7 4.9 4.5  HGB 11.0* 9.2* 9.1* 9.3*  HCT 34.6* 28.6* 27.9* 29.2*  MCV 92.3 92.9 92.4 91.8  PLT 277 273 270 290   Cardiac Enzymes: No results for input(s):  CKTOTAL, CKMB, CKMBINDEX, TROPONINI in the last 168 hours. BNP (last 3 results)  Recent Labs  08/05/14 0115  BNP 780.2*   ProBNP (last 3 results) No results for input(s): PROBNP in the last 8760 hours.  CBG:  Recent Labs Lab 10/14/14 2124 10/14/14 2342 10/15/14 0351 10/15/14 0719 10/15/14 1207  GLUCAP 112* 103* 105* 110* 116*   Recent Results (from the past 240 hour(s))  Culture, blood (routine x 2)     Status: None (Preliminary result)   Collection Time: 10/11/14  4:22 PM  Result Value Ref Range Status   Specimen Description BLOOD LEFT HAND  Final   Special Requests BOTTLES DRAWN AEROBIC AND ANAEROBIC 5 CC EA  Final   Culture   Final    NO GROWTH 3 DAYS Performed at [facility]    Report Status PENDING  Incomplete  Culture, blood (routine x 2)     Status: None (Preliminary result)   Collection Time: 10/11/14  4:25 PM  Result Value Ref Range Status   Specimen Description BLOOD RIGHT HAND  Final  Special Requests BOTTLES DRAWN AEROBIC AND ANAEROBIC 5 CC EA  Final   Culture   Final    NO GROWTH 3 DAYS Performed at [facility]    Report Status PENDING  Incomplete  Wound culture     Status: None   Collection Time: 10/12/14 11:59 AM  Result Value Ref Range Status   Specimen Description WOUND NECK  Final   Special Requests NONE  Final   Gram Stain   Final    RARE WBC PRESENT, PREDOMINANTLY PMN NO SQUAMOUS EPITHELIAL CELLS SEEN RARE GRAM POSITIVE COCCI IN PAIRS Performed at [facility]    Culture   Final    MODERATE STAPHYLOCOCCUS AUREUS Note: RIFAMPIN AND GENTAMICIN SHOULD NOT BE USED AS SINGLE DRUGS FOR TREATMENT OF STAPH INFECTIONS. Performed at [facility]    Report Status 10/15/2014 FINAL  Final   Organism ID, Bacteria STAPHYLOCOCCUS AUREUS  Final      Susceptibility   Staphylococcus aureus - MIC*    CLINDAMYCIN <=0.25 SENSITIVE Sensitive     ERYTHROMYCIN <=0.25 SENSITIVE Sensitive     GENTAMICIN <=0.5 SENSITIVE  Sensitive     LEVOFLOXACIN <=0.12 SENSITIVE Sensitive     OXACILLIN <=0.25 SENSITIVE Sensitive     RIFAMPIN <=0.5 SENSITIVE Sensitive     TRIMETH/SULFA <=10 SENSITIVE Sensitive     VANCOMYCIN <=0.5 SENSITIVE Sensitive     TETRACYCLINE <=1 SENSITIVE Sensitive     MOXIFLOXACIN <=0.25 SENSITIVE Sensitive     * MODERATE STAPHYLOCOCCUS AUREUS    Studies: Dg Chest 2 View  10/14/2014   CLINICAL DATA:  Aspiration.  EXAM: CHEST  2 VIEW  COMPARISON:  Radiograph 10/11/2014  FINDINGS: Sternotomy wires overlie normal cardiac silhouette. Endotracheal tube extends the stomach. RIGHT port in place. Lungs are hyperinflated. Small bilateral pleural effusions.  IMPRESSION: 1. Support apparatus appears in good position. 2. Small bilateral pleural effusions.   Electronically Signed   By: Suzy Bouchard M.D.   On: 10/14/2014 09:51   Scheduled Meds: . amoxicillin-clavulanate  500 mg Per Tube 3 times per day  . antiseptic oral rinse  7 mL Mouth Rinse BID  . enoxaparin (LOVENOX) injection  40 mg Subcutaneous Q24H  . fentaNYL  100 mcg Transdermal Q72H  . free water  100 mL Per Tube 4 times per day  . LORazepam  0.25 mg Intravenous TID  . magnesium sulfate 1 - 4 g bolus IVPB  1 g Intravenous Once  . metoprolol  2.5 mg Intravenous Q12H  . pantoprazole (PROTONIX) IV  40 mg Intravenous Q24H  . potassium phosphate IVPB (mmol)  10 mmol Intravenous Once  . scopolamine  1 patch Transdermal Q72H  . sennosides  10 mL Oral BID  . silver sulfADIAZINE   Topical Daily   Continuous Infusions: . sodium chloride 1,000 mL (10/15/14 1115)  . feeding supplement (VITAL 1.5 CAL) 1,000 mL (10/15/14 0956)   Principal Problem:   Fever in adult Active Problems:   CAD-hx of CABG '06, DES LCX 01/2013, DES SVG-LAD 5/21/5 at [facility]   Ischemic cardiomyopathy, EF 45%   Fever, possible aspiration-treated with antibiotics   Squamous cell carcinoma of larynx (HCC)   Cancer associated pain   Dysphagia   Laryngeal cancer (HCC)    Protein-calorie malnutrition, severe ([place])   Palliative care encounter   DNR (do not resuscitate) discussion   Pain in throat   Increased oropharyngeal secretions  Time spent: 15  Kaushal Vannice, MD [facility] If 7PM-7AM, please contact night-coverage at [email or web address], password  [email or web address] 10/15/2014, 12:23 PM  LOS: 4 days

## 2014-10-15 NOTE — Progress Notes (Signed)
Wound care to neck performed. Cleansed with NS soaked sterile gauze,left it for 10 minutes to neck,site dried and folded Derma therapy pillowcase applied.Will continue to monitor.

## 2014-10-15 NOTE — Progress Notes (Signed)
Pharmacy - Brief Note for Phosphorus Replacement  Pharmacy is asked to dose phosphorous. Patient showing signs of refeeding syndrome with initiation of tube feeding.    - Phos improving, now 2.9 after KPhos 15 mmol on 9/30 and 10 mmol on 10/1.  Goal Phos level is closer to 4 in patient at risk for refeeding.  Also of note:  - K improved to 3.7 after replacement with total 148mEq potassium on 10/1.   - Mag is 1.8, not re-checked today.   Plan: 1. Repeat KPhos 10 mmol IV today over 6 hours.  Check Phosphorus level tomorrow morning. 2.  Consider replacing Magnesium to achieve goal of 2 in a patient at risk for refeeding.  Ralene Bathe, PharmD, BCPS 10/15/2014, 10:52 AM  Pager: [phone]

## 2014-10-16 ENCOUNTER — Ambulatory Visit: Payer: Medicaid Other

## 2014-10-16 ENCOUNTER — Encounter: Payer: Self-pay | Admitting: *Deleted

## 2014-10-16 ENCOUNTER — Ambulatory Visit
Admission: RE | Admit: 2014-10-16 | Discharge: 2014-10-16 | Disposition: A | Discharge status: Home / Self Care | Payer: Medicaid Other | Source: Ambulatory Visit | Attending: Radiation Oncology | Admitting: Radiation Oncology

## 2014-10-16 ENCOUNTER — Ambulatory Visit: Payer: Self-pay

## 2014-10-16 DIAGNOSIS — Z510 Encounter for antineoplastic radiation therapy: Secondary | ICD-10-CM | POA: Insufficient documentation

## 2014-10-16 DIAGNOSIS — T17908A Unspecified foreign body in respiratory tract, part unspecified causing other injury, initial encounter: Secondary | ICD-10-CM | POA: Insufficient documentation

## 2014-10-16 DIAGNOSIS — L089 Local infection of the skin and subcutaneous tissue, unspecified: Secondary | ICD-10-CM

## 2014-10-16 DIAGNOSIS — T17908S Unspecified foreign body in respiratory tract, part unspecified causing other injury, sequela: Secondary | ICD-10-CM

## 2014-10-16 DIAGNOSIS — C329 Malignant neoplasm of larynx, unspecified: Secondary | ICD-10-CM | POA: Insufficient documentation

## 2014-10-16 LAB — GLUCOSE, CAPILLARY
GLUCOSE-CAPILLARY: 108 mg/dL — AB (ref 65–99)
GLUCOSE-CAPILLARY: 115 mg/dL — AB (ref 65–99)
GLUCOSE-CAPILLARY: 98 mg/dL (ref 65–99)
Glucose-Capillary: 125 mg/dL — ABNORMAL HIGH (ref 65–99)
Glucose-Capillary: 124 mg/dL — ABNORMAL HIGH (ref 65–99)
Glucose-Capillary: 101 mg/dL — ABNORMAL HIGH (ref 65–99)

## 2014-10-16 LAB — CBC
HCT: 28.7 % — ABNORMAL LOW (ref 39.0–52.0)
HEMOGLOBIN: 9.1 g/dL — AB (ref 13.0–17.0)
MCH: 28.4 pg (ref 26.0–34.0)
MCHC: 31.7 g/dL (ref 30.0–36.0)
MCV: 89.7 fL (ref 78.0–100.0)
Platelets: 385 10*3/uL (ref 150–400)
RBC: 3.2 MIL/uL — AB (ref 4.22–5.81)
RDW: 15.6 % — ABNORMAL HIGH (ref 11.5–15.5)
WBC: 6.1 10*3/uL (ref 4.0–10.5)

## 2014-10-16 LAB — CULTURE, BLOOD (ROUTINE X 2)
CULTURE: NO GROWTH
Culture: NO GROWTH

## 2014-10-16 LAB — BASIC METABOLIC PANEL
ANION GAP: 6 (ref 5–15)
BUN: <5 mg/dL — ABNORMAL LOW (ref 6–20)
CALCIUM: 8.2 mg/dL — AB (ref 8.9–10.3)
CO2: 29 mmol/L (ref 22–32)
Chloride: 103 mmol/L (ref 101–111)
Creatinine, Ser: 0.52 mg/dL — ABNORMAL LOW (ref 0.61–1.24)
GLUCOSE: 113 mg/dL — AB (ref 65–99)
POTASSIUM: 3.9 mmol/L (ref 3.5–5.1)
SODIUM: 138 mmol/L (ref 135–145)

## 2014-10-16 LAB — MAGNESIUM: MAGNESIUM: 1.9 mg/dL (ref 1.7–2.4)

## 2014-10-16 MED ORDER — CEFAZOLIN SODIUM-DEXTROSE 2-3 GM-% IV SOLR
2.0000 g | INTRAVENOUS | Status: AC
  Administered 2014-10-17: 2 g via INTRAVENOUS

## 2014-10-16 NOTE — Progress Notes (Signed)
Speech Language Pathology Patient Details Name: BRIXTON FRANKO MRN: [id number] DOB: Feb 08, 1955 Today's Date: 10/16/2014 Time:  -       Dr Coralyn Pear reported to this SLP conversation he had with pt's radiation oncologist resulting in agreement that PEG now will be an option (initial concern due to anatomy). Decision made to not pursue MBS at this time; MD will proceed with PEG. Pt has not completed radiation treatment (SLP's understanding). He would benefit from [facility] in the future to determine ability to initiate po's safely. ST will sign off.     Orbie Pyo [place] [phone]

## 2014-10-16 NOTE — Progress Notes (Addendum)
Speech Language Pathology    Patient Details Name: [name] MRN: [id number] DOB: 1955-01-26 Today's Date: 10/16/2014 Time:  -      Order received for swallow (re) assessment. This SLP worked with pt 9/29; recommend objective assessment with MBS. Currently has large bore NGT which partially obstructs epiglottic deflection (compared to PANDA or Kangaroo) and may prevent pt from protecting airway despite baseline dysphagia. Dr. Coralyn Pear to contact pt's radiation oncologist for direction to remove NGT prior to MBS.   Orbie Pyo [place] [phone]

## 2014-10-16 NOTE — Progress Notes (Signed)
  Oncology Nurse Navigator Documentation     Patient Visit Type: Inpatient (10/16/14 1420)     To provide support and encouragement, care continuity and to assess for needs, met with patient in [place].  His mother-in-law and young dtr were present. Neck desquamation much improved since I saw him last Thursday, minimal moist areas, scabbing much reduced.  He reported neck is painful. He reports intolerance of NG "this has got to go", stated PEG is to be placed tomorrow He went for scheduled Tomo tmt. Will continue to follow during this admission.  [name], RN, BSN, [place] at [place] [phone]                     Time Spent with Patient: 15 (10/16/14 1420)

## 2014-10-16 NOTE — Progress Notes (Signed)
Patient ID: TRACEN MAHLER, male   DOB: Jan 31, 1955, 59 y.o.   MRN: [id number] Called by Dr. Pablo Ledger to see if this patient was a candidate for an open g-tube given his recent history of surgical intervention with 2 laparotomies less than 3 months ago.  He has laryngeal cancer and under treatment by Dr. Alvy Bimler and Dr. Pablo Ledger.  He currently has an NGT in place for feeding access as he is dealing with dysphagia, but he is not obstructed.    After discussion with Dr. Excell Seltzer, given the options of a PANDA tube for temporary feedings vs IR being willing to place a PEG tube, the very high risk for an open g-tube is not something we would consider at this time.  Will defer decision to PANDA vs PEG to primary team and oncologist, but no plans for surgical intervention for open gastrostomy tube placement.  Please call if you have questions.  Xavian Hardcastle E 1:52 PM 10/16/2014

## 2014-10-16 NOTE — Progress Notes (Signed)
Patient ID: [name], male   DOB: 06-Jul-1955, 59 y.o.   MRN: [id number]    Referring Physician(s): [facility]/Gorsuch/W[facility]worth  Chief Complaint:  Laryngeal cancer, dysphagia, protein calorie malnutrition  Subjective: Patient familiar to IR service from prior Port-A-Cath placement on 07/28/14. He has known history of squamous cell cancer of the larynx diagnosed in June 2016, currently undergoing radiation therapy. Past medical history also significant for a non-STEMI in July 2016 following anaphylaxis from chemotherapeutic agent. Shortly afterwards patient suffered perforated diverticulitis and pneumoperitoneum with subsequent exploratory laparotomy, sigmoid colectomy and end colostomy on 08/05/14. He has since been treated for aspiration pneumonia as well as radiation dermatitis to the neck region. He currently has NG tube in place but has continued with symptoms of sore throat, dysphagia, mucositis, and intermittent nausea and vomiting. Request now received for percutaneous gastrostomy tube placement. He denies recent fevers, chills, headaches, chest pain, worsening dyspnea, significant abdominal or back pain or abnormal bleeding.  Allergies: Cetuximab  Medications: Prior to Admission medications   Medication Sig Start Date End Date Taking? Authorizing Provider  acetaminophen (TYLENOL) 500 MG tablet Take 1,000 mg by mouth every 6 (six) hours as needed for mild pain, moderate pain or headache.   Yes Historical Provider, MD  albuterol (PROVENTIL HFA;VENTOLIN HFA) 108 (90 BASE) MCG/ACT inhaler Inhale 2 puffs into the lungs every 6 (six) hours as needed for wheezing or shortness of breath. 08/12/14  Yes Shanker Kristeen Mans, MD  ALPRAZolam Duanne Moron) 0.5 MG tablet Take 0.5 mg by mouth 3 (three) times daily as needed for anxiety.    Yes Historical Provider, MD  amoxicillin-clavulanate (AUGMENTIN) 875-125 MG tablet Take 1 tablet by mouth 2 (two) times daily. 10/10/14  Yes [name], MD  aspirin EC 81 MG  tablet Take 81 mg by mouth daily.   Yes Historical Provider, MD  buPROPion (WELLBUTRIN XL) 300 MG 24 hr tablet Take 300 mg by mouth daily.   Yes Historical Provider, MD  carvedilol (COREG) 12.5 MG tablet Take 0.5 tablets (6.25 mg total) by mouth 2 (two) times daily. 08/12/14  Yes Shanker Kristeen Mans, MD  fentaNYL (DURAGESIC - DOSED MCG/HR) 100 MCG/HR Place 1 patch (100 mcg total) onto the skin every 3 (three) days. 10/03/14  Yes [name], MD  lidocaine-prilocaine (EMLA) cream Apply 1 application topically as directed. Apply to affected area once 09/05/14  Yes [name], MD  morphine (MSIR) 30 MG tablet Take 1 tablet (30 mg total) by mouth every 4 (four) hours as needed for severe pain. 10/10/14  Yes [name], MD  Morphine Sulfate (MORPHINE CONCENTRATE) 10 mg / 0.5 ml concentrated solution Take 1 mL (20 mg total) by mouth every 2 (two) hours as needed for severe pain. 09/05/14  Yes [name], MD  pantoprazole (PROTONIX) 40 MG tablet TAKE 1 TABLET BY MOUTH EVERY DAY Patient taking differently: TAKE 40 MG BY MOUTH DAILY 04/17/14  Yes Mihai Croitoru, MD  potassium chloride SA (K-DUR,KLOR-CON) 20 MEQ tablet TAKE 20 MEQ BY MOUTH ONCE DAILY 06/08/14  Yes Historical Provider, MD  prochlorperazine (COMPAZINE) 10 MG tablet Take 1 tablet (10 mg total) by mouth every 6 (six) hours as needed for nausea or vomiting. 10/06/14  Yes [name], MD     Vital Signs: BP 130/54 mmHg  Pulse 89  Temp(Src) 98 F (36.7 C) (Oral)  Resp 18  Ht 5\' 7"  (1.702 m)  Wt 150 lb 2.1 oz (68.1 kg)  BMI 23.51 kg/m2  SpO2 97%  Physical Exam patient awake,  alert. NG tube in place ; extensive radiation dermatitis of anterior neck region ;Chest with slightly diminished breath sounds at bases, few right basilar crackles; clean intact right chest wall Port-A-Cath;  heart with regular rate and rhythm. Abdomen soft, positive bowel sounds, nontender, intact left lower quadrant colostomy, small midline hernia as well as clean midline scar  below the umbilicus. Extremities with full range of motion and no significant edema.  Imaging: Dg Chest 2 View  10/14/2014   CLINICAL DATA:  Aspiration.  EXAM: CHEST  2 VIEW  COMPARISON:  Radiograph 10/11/2014  FINDINGS: Sternotomy wires overlie normal cardiac silhouette. Endotracheal tube extends the stomach. RIGHT port in place. Lungs are hyperinflated. Small bilateral pleural effusions.  IMPRESSION: 1. Support apparatus appears in good position. 2. Small bilateral pleural effusions.   Electronically Signed   By: Suzy Bouchard M.D.   On: 10/14/2014 09:51   Dg Loyce Dys Tube Plc W/fl W/rad  10/12/2014   CLINICAL DATA:  Laryngeal cancer. Unsuccessful nasogastric tube attempts x2 on the inpatient floor. Requested fluoroscopy guided-nasogastric tube placement .  EXAM: NASO G TUBE PLACEMENT WITH FL AND WITH RAD  CONTRAST:  None.  FLUOROSCOPY TIME:  Fluoroscopy Time (in minutes and seconds): 1 minutes 30 seconds  Number of Acquired Images: 0 fluoroscopy exposure images. (1 image capture acquired)  COMPARISON:  10/11/2014 chest radiograph and 08/04/2014 chest CT.  FINDINGS: Under real-time fluoroscopic guidance, a 35 French nasogastric tube was placed through the left nostril into the proximal stomach, as verified by fluoroscopy. The nasogastric tube was externally secured in place by the technologist. The procedure was well tolerated, with no immediate complication.  IMPRESSION: Successful fluoroscopically guided nasogastric tube placement. The nasogastric tube is ready for use on the inpatient floor.   Electronically Signed   By: Ilona Sorrel M.D.   On: 10/12/2014 14:55    Labs:  CBC:  Recent Labs  10/11/14 1612 10/12/14 1140 10/13/14 0544 10/16/14 0605  WBC 7.7 4.9 4.5 6.1  HGB 9.2* 9.1* 9.3* 9.1*  HCT 28.6* 27.9* 29.2* 28.7*  PLT 273 270 290 385    COAGS:  Recent Labs  06/22/14 1729 07/28/14 1230  INR 0.99 0.94  APTT  --  32    BMP:  Recent Labs  10/13/14 0544 10/14/14 0508  10/15/14 0750 10/16/14 0605  NA 139 139 136 138  K 3.1* 3.2* 3.7 3.9  CL 105 105 107 103  CO2 29 27 28 29   GLUCOSE 126* 144* 120* 113*  BUN 5* <5* <5* <5*  CALCIUM 7.9* 7.8* 7.9* 8.2*  CREATININE 0.71 0.62 0.58* 0.52*  GFRNONAA >60 >60 >60 >60  GFRAA >60 >60 >60 >60    LIVER FUNCTION TESTS:  Recent Labs  08/05/14 0115 09/25/14 0948 10/09/14 1138 10/13/14 0544  BILITOT 0.4 0.34 0.5 0.5  AST 38 34 23 22  ALT 41 25 15* 11*  ALKPHOS 71 140 115 86  PROT 5.8* 6.9 6.9 5.3*  ALBUMIN 3.1* 3.3* 3.4* 2.4*    Assessment and Plan: Patient with history of squamous cell cancer of the larynx diagnosed in June 2016, currently undergoing radiation therapy; also with prior history of non-STEMI as well as perforated diverticulitis/ pneumoperitoneum July 2016 requiring exploratory lap, sigmoid colectomy with end colostomy; recently treated right lower lobe aspiration pneumonia; now with persistent sore throat, radiation dermatitis anterior neck region, dysphagia, mucositis,  intermittent nausea and vomiting, and protein calorie malnutrition. Request received for percutaneous gastrostomy tube placement. Recent imaging studies have been reviewed by Dr.  Watts. Patient currently afebrile, hemoglobin stable, WBC normal, staph aureus/MSSA noted in neck wound culture.   Anatomically patient appears to be an appropriate candidate for percutaneous gastrostomy tube placement, most likely direct puncture technique. Risks and benefits discussed with the patient/family including, but not limited to the need for a barium enema during the procedure, bleeding, infection, peritonitis, or damage to adjacent structures.All of the patient's questions were answered, patient is agreeable to proceed. Consent signed and in chart. We will tentatively plan procedure for either 10/ 4 or 10/ 5.     Signed: D. Rowe Robert 10/16/2014, 2:23 PM   I spent a total of 25 minutes at the the patient's bedside AND on the patient's  hospital floor or unit, greater than 50% of which was counseling/coordinating care for percutaneous gastrostomy tube placement

## 2014-10-16 NOTE — Progress Notes (Signed)
[name]   DOB:1955/03/19   GH#:[id number]   Oncology History   Squamous cell carcinoma of larynx   Staging form: Larynx - Supraglottis, AJCC 7th Edition     Clinical stage from 07/20/2014: Stage III (T3, N0, M0) - Signed by [name], MD on 08/01/2014       Squamous cell carcinoma of larynx (HCC)   06/22/2014 Imaging CT scan showed greater than 2 cm supraglottic squamous cell carcinoma of the larynx with contralateral extension. No regional pathologic adenopathy   07/12/2014 Pathology Results Accession: [id number] biopsy of larynx show squamous cell carcinoma   07/12/2014 Surgery Laryngoscopy revealed a large exophytic tumor arising from the left supraglottic larynx, aryepiglottic fold. It extended from the anterior commissure and the epiglottis anteriorly, to the arytenoid posteriorly. The entire aryepiglottic fold was involved   07/27/2014 Imaging Staging PET:  1. Intense metabolic activity associated with the left laryngeal mass.  2. Single mildly hypermetabolic small left level 2 lymph node.  No additional evidence of metastatic adenopathy in neck.  3. No evidence of thoracic metastasis.   07/28/2014 Procedure Port-a-cath placed.   08/02/2014 - 08/04/2014 Hospital Admission Admitted to ICU following allergic rxt to Cetuximab resulting in cardiac arrest   08/02/2014 Adverse Reaction The patient received a small amount of cetuximab resulting in cardiac arrest. The medication was permanently discontinued   08/04/2014 Imaging CT scan of abdomen showed neumoperitoneum. 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     Subjective:  He is improving. Events over the past few days were noted. He complained of severe pain, 8 out of 10 for pain in his throat and burning sensation on his skin but much improved compared to last week. There were no reported history of delirium this past weekend. The patient admits he could not remember much of what happened last week. NG tube was placed and he was able to tolerate NG feeding well, current rate of tube feeds at 60 mL per hour. He had one episode of nausea vomiting yesterday, precipitated by gagging from mucous production at the back of his throat. Denies further fevers or chills.  Objective:  Filed Vitals:   10/16/14 0516  BP: 141/70  Pulse: 93  Temp: 97.6 F (36.4 C)  Resp: 16     Intake/Output Summary (Last 24 hours) at 10/16/14 0949 Last data filed at 10/16/14 0525  Gross per 24 hour  Intake   1200 ml  Output   2645 ml  Net  -1445 ml    GENERAL:alert, no distress and comfortable SKIN: He has erythema on the skin around his neck, improved compared to last week. EYES: normal, Conjunctiva are pink and non-injected, sclera clear OROPHARYNX:no exudate, no erythema and lips, buccal mucosa, and tongue normal . NG tube in situ NECK: supple, thyroid normal size, non-tender, without nodularity LYMPH:  no palpable lymphadenopathy in the cervical, axillary or inguinal LUNGS: clear to auscultation and percussion with normal breathing effort HEART: regular rate & rhythm and no murmurs and no lower extremity edema ABDOMEN:abdomen soft, non-tender and normal bowel sounds. Colostomy draining liquid stool. Musculoskeletal:no cyanosis of digits and no clubbing  NEURO: alert & oriented x 3 with fluent speech, no  focal motor/sensory deficits   Labs:  Lab Results  Component Value Date   WBC 6.1 10/16/2014   HGB 9.1* 10/16/2014   HCT 28.7* 10/16/2014   MCV 89.7 10/16/2014   PLT 385 10/16/2014   NEUTROABS 3.4 09/25/2014    Lab Results  Component Value Date   NA 138 10/16/2014    K 3.9 10/16/2014   CL 103 10/16/2014   CO2 29 10/16/2014    Assessment & Plan:   Squamous cell carcinoma of larynx I will defer to Dr. Pablo Ledger to manage his radiation treatment while hospitalized Continue supportive care.  Infection, respiratory tract, possible RLL pneumonia secondary to aspiration The patient had unresolved upper respiratory tract infection and now with possible skin infection. Continue IV antibiotics, cultures  Overall, he is improving.  Skin ulceration He has significant skin ulceration, improving with wound care Continue to same  Protein calorie malnutrition The patient is not able to tolerate any form of oral intake. The dietitian is concerned he may be at risk of aspiration. He tolerated NG tube feeding well. The patient would be at risk of refeeding syndrome. Monitor carefully Speech therapist felt that he probably would not be able to swallow. I have discussed with the hospitalist to consider PEG tube feeding, however this could be technically challenging due to recent abdominal surgery.  Uncontrollable mucositis and neck pain He is on fentanyl patch and intermittent doses of liquid morphine. He would need aggressive pain management and I agree with involvement of palliative care service for assistance in pain management and other symptomatic management.  Anemia of chronic disease He is not symptomatic. No need for blood transfusion  Intermittent confusion & delirium, resolved I would recommend family members involvement to orient the patient and reduce the risk of delirium and falls  Recent non-STEMI Clinically, he is not symptomatic. There is no signs and symptoms of congestive heart failure. Continue medical management  Discharge planning He had a lot of ongoing medical issues. Likely would need to be in the hospital until nutrition route is established. We will get a physical therapy assessment in the near future to determine whether it  would be safe for the patient to be discharged home. Will follow  [place], [place], MD 10/16/2014  9:49 AM

## 2014-10-16 NOTE — Progress Notes (Signed)
[facility] PROGRESS NOTE  HA PLACERES XNA:[id number] DOB: Apr 16, 1955 DOA: 10/11/2014 PCP: Imelda Pillow, NP  HPI:  [name] is a 59 y.o. 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 Has been receiving radiation to his neck, nearly finished with radiation course. He was intolerant to chemotherapy (anaphylaxis) so that was discontinued. Of late, the patient has been having increasing dysphagia progressing from solids now to liquids as well. He does complain of moderate throat pain, especially when talking or swallowing. Two days prior to presentation to ED, he began having fevers up to 101 and a productive cough, was given augmentin by oncologist. Patient unable to swallow pills or tolerate PO intake so was admitted to the hospital.   Upon admission, chest x-ray reveals R side infiltrates, possibly d/t aspiration given severe dysphagia. Also of note, patient has severe scabbing and ulceration of his neck secondary to radiation therapy. There is now purulent discharge coming from wound bed and the area is causing patient significant discomfort. Patient denies CP, SOB, abdominal pain, or urinary complaints.   Subjective:  One episode of vomiting last night, denies nausea. Strength is improving as nutrition status improves. Pain is under better control (4/10 today). Still having productive cough, but sputum is now clear-yellow instead of rusty brown. Denies CP, SOB, abdominal pain.  Assessment/Plan: Probable Aspiration Pneumonia: Patient has remained afebrile since admission. Blood cultures negative so far. Initially treated with broad coverage IV abx have been started (levaquin, flagyl, and vancomycin). He was transitioned to Augmentin per tube on 10/14/2014, remains nontoxic andafebrile. Last CXR done on 10/14/2014 remains stable.  Acute Radiation Dermatitis:  Patient nearly finished with radiation course for laryngeal cancer, presenting with severe scabbing and ulceration of the neck secondary to this. Wound care has been consulted due to purlulent discharge coming from wound. Recommending twice daily and PRN cleansing of the tissue with NS and gently patting dry to soften and eventually remove the dried serum (scabbing). Wound exudate will be managed with absorption and provision of wound contact with an antimicrobial textile (Dermatherapy) Wound culture grew staph aureus, which is covered by antibiotics given for PNA. Continue pain management with fentanyl patch and roxanol as needed. Would is healing well, improving daily.    Dysphagia: He presents with dysphagia having history of laryngeal cancer. NG tube was placed on 10/12/2014. I spoke to interventional radiology and had to Dr. Pablo Ledger of the possibility of placing a PEG tube in this patient. Discussing case with interventional radiology, gastrostomy tube is a possibility reviewing anatomy. Dr. Pablo Ledger recommending consulting general surgery as well for their input.   Squamous Cell Carcinoma of Larynx: Radiation oncology is following, plan to complete his radiation therapy.   Hypokalemia: Resolved. Likely secondary to severe dysphagia and minimal PO intake the past week. Potassium improving to 3.9 after replacement.   Hypomagnesemia. Resolved with repletion, magnesium is now 1.9  Hypophosphatemia. His phosphorus improved to 2.9 after replacement  Severe Protein Calorie Malnutrition: Evidenced by having wt of 68.1 Kg. Will continue to replace electrolytes and provide nutrition through tube feeds. Plan to reassess capability for PO intake today.   Anxiety: Stable on ativan PRN, continue  Anemia of Chronic Disease: Continue close monitoring of CBC, Hg remains stable since admission. No indication for transfusion at this time.   DVT Prophylaxis: Lovenox Code Status: Full code Family  Communication: None at bedside  Disposition Plan:  Continue as inpatient, pending SLP eval to decide route of nutrition needed  Consultants:  Interventional radiology  Oncology  Pulmonology  Procedures:  NG Tube Placement 10/12/2014  Antibiotics:  Levaquin 10/11/2014 >> 10/14/2014  Flagyl 10/11/2014 >> 10/14/2014  Vancomycin 10/11/2014 >>10/14/2014  Augmentin 10/14/2014 >>  Objective: Filed Vitals:   10/16/14 0516  BP: 141/70  Pulse: 93  Temp: 97.6 F (36.4 C)  Resp: 16    Intake/Output Summary (Last 24 hours) at 10/16/14 0801 Last data filed at 10/16/14 0525  Gross per 24 hour  Intake   1200 ml  Output   2645 ml  Net  -1445 ml   Filed Weights   10/13/14 2016 10/15/14 0620 10/16/14 0516  Weight: 66.588 kg (146 lb 12.8 oz) 68.5 kg (151 lb 0.2 oz) 68.1 kg (150 lb 2.1 oz)   Exam:   General: NAD, underweight, cachectic  HEENT: No scleral icterus, mucous membranes dry  Neck: No JVD, no cervical LAD  Cardiovascular: RRR, no m/r/g  Respiratory: Crackles at right base, unlabored respiratory effort  Abdomen: Soft, non tender, no masses, +BS, colostomy bag in place  Musculoskeletal: No LE edema, warm, full ROM in all 4   Neuro: Alert and oriented, non focal  Psych: Normal mood and affect, speech clear and appropriate  Skin: Extensive ulceration at base of neck with scabbing   Data Reviewed: Basic Metabolic Panel:  Recent Labs Lab 10/12/14 1140 10/13/14 0544 10/14/14 0508 10/15/14 0430 10/15/14 0750 10/16/14 0605  NA 136 139 139  --  136 138  K 3.1* 3.1* 3.2*  --  3.7 3.9  CL 105 105 105  --  107 103  CO2 29 29 27   --  28 29  GLUCOSE 140* 126* 144*  --  120* 113*  BUN 6 5* <5*  --  <5* <5*  CREATININE 0.73 0.71 0.62  --  0.58* 0.52*  CALCIUM 7.9* 7.9* 7.8*  --  7.9* 8.2*  MG  --  1.6* 1.8  --   --  1.9  PHOS  --  1.7* 2.5 2.9  --   --    Liver Function Tests:  Recent Labs Lab 10/09/14 1138 10/13/14 0544  AST 23 22  ALT 15* 11*   ALKPHOS 115 86  BILITOT 0.5 0.5  PROT 6.9 5.3*  ALBUMIN 3.4* 2.4*   No results for input(s): LIPASE, AMYLASE in the last 168 hours. No results for input(s): AMMONIA in the last 168 hours. CBC:  Recent Labs Lab 10/09/14 1138 10/11/14 1612 10/12/14 1140 10/13/14 0544 10/16/14 0605  WBC 10.6* 7.7 4.9 4.5 6.1  HGB 11.0* 9.2* 9.1* 9.3* 9.1*  HCT 34.6* 28.6* 27.9* 29.2* 28.7*  MCV 92.3 92.9 92.4 91.8 89.7  PLT 277 273 270 290 385   Cardiac Enzymes: No results for input(s): CKTOTAL, CKMB, CKMBINDEX, TROPONINI in the last 168 hours. BNP (last 3 results)  Recent Labs  08/05/14 0115  BNP 780.2*   ProBNP (last 3 results) No results for input(s): PROBNP in the last 8760 hours.  CBG:  Recent Labs Lab 10/15/14 1207 10/15/14 1641 10/15/14 1954 10/15/14 2358 10/16/14 0357  GLUCAP 116* 105* 104* 115* 108*    Recent Results (from the past 240 hour(s))  Culture, blood (routine x 2)     Status: None (Preliminary result)   Collection Time: 10/11/14  4:22 PM  Result Value Ref Range Status   Specimen Description BLOOD LEFT HAND  Final   Special Requests BOTTLES DRAWN AEROBIC AND ANAEROBIC 5 CC  EA  Final   Culture   Final    NO GROWTH 4 DAYS Performed at [facility]    Report Status PENDING  Incomplete  Culture, blood (routine x 2)     Status: None (Preliminary result)   Collection Time: 10/11/14  4:25 PM  Result Value Ref Range Status   Specimen Description BLOOD RIGHT HAND  Final   Special Requests BOTTLES DRAWN AEROBIC AND ANAEROBIC 5 CC EA  Final   Culture   Final    NO GROWTH 4 DAYS Performed at [facility]    Report Status PENDING  Incomplete  Wound culture     Status: None   Collection Time: 10/12/14 11:59 AM  Result Value Ref Range Status   Specimen Description WOUND NECK  Final   Special Requests NONE  Final   Gram Stain   Final    RARE WBC PRESENT, PREDOMINANTLY PMN NO SQUAMOUS EPITHELIAL CELLS SEEN RARE GRAM POSITIVE COCCI IN  PAIRS Performed at [facility]    Culture   Final    MODERATE STAPHYLOCOCCUS AUREUS Note: RIFAMPIN AND GENTAMICIN SHOULD NOT BE USED AS SINGLE DRUGS FOR TREATMENT OF STAPH INFECTIONS. Performed at [facility]    Report Status 10/15/2014 FINAL  Final   Organism ID, Bacteria STAPHYLOCOCCUS AUREUS  Final      Susceptibility   Staphylococcus aureus - MIC*    CLINDAMYCIN <=0.25 SENSITIVE Sensitive     ERYTHROMYCIN <=0.25 SENSITIVE Sensitive     GENTAMICIN <=0.5 SENSITIVE Sensitive     LEVOFLOXACIN <=0.12 SENSITIVE Sensitive     OXACILLIN <=0.25 SENSITIVE Sensitive     RIFAMPIN <=0.5 SENSITIVE Sensitive     TRIMETH/SULFA <=10 SENSITIVE Sensitive     VANCOMYCIN <=0.5 SENSITIVE Sensitive     TETRACYCLINE <=1 SENSITIVE Sensitive     MOXIFLOXACIN <=0.25 SENSITIVE Sensitive     * MODERATE STAPHYLOCOCCUS AUREUS    Studies: Dg Chest 2 View  10/14/2014   CLINICAL DATA:  Aspiration.  EXAM: CHEST  2 VIEW  COMPARISON:  Radiograph 10/11/2014  FINDINGS: Sternotomy wires overlie normal cardiac silhouette. Endotracheal tube extends the stomach. RIGHT port in place. Lungs are hyperinflated. Small bilateral pleural effusions.  IMPRESSION: 1. Support apparatus appears in good position. 2. Small bilateral pleural effusions.   Electronically Signed   By: Suzy Bouchard M.D.   On: 10/14/2014 09:51   Scheduled Meds: . amoxicillin-clavulanate  500 mg Per Tube 3 times per day  . antiseptic oral rinse  7 mL Mouth Rinse BID  . enoxaparin (LOVENOX) injection  40 mg Subcutaneous Q24H  . fentaNYL  100 mcg Transdermal Q72H  . free water  100 mL Per Tube 4 times per day  . LORazepam  0.25 mg Intravenous TID  . metoprolol  2.5 mg Intravenous Q12H  . pantoprazole (PROTONIX) IV  40 mg Intravenous Q24H  . scopolamine  1 patch Transdermal Q72H  . sennosides  10 mL Oral BID  . silver sulfADIAZINE   Topical Daily   Continuous Infusions: . sodium chloride 75 mL/hr at 10/16/14 0016  . feeding  supplement (VITAL 1.5 CAL) 1,000 mL (10/15/14 0956)   Principal Problem:   Fever in adult Active Problems:   CAD-hx of CABG '06, DES LCX 01/2013, DES SV[facility]LAD 5/21/5 [facility]   Ischemic cardiomyopathy, EF 45%   Fever, possible aspiration-treated with antibiotics   Squamous cell carcinoma of larynx (HCC)   Cancer associated pain   Dysphagia   Laryngeal cancer ([place])   Protein-calorie  malnutrition, severe ([place])   Palliative care encounter   DNR (do not resuscitate) discussion   Pain in throat   Increased oropharyngeal secretions   Time spent: [id number] [place] [facility] If 7PM-7AM, please contact night-coverage at [email or web address], password [facility] 10/16/2014, 8:01 AM  LOS: 5 days    Addendum  I personally evaluated patient on 10/16/2014 and agree with above findings. Mr. Goede is a pleasant 59 year old gentleman with a history of laryngeal cancer stage III (T3, N0, M0) squamous cell carcinoma, admitted to the medicine service on 10/11/2014 presented with worsening dysphagia, unable to take by mouth and the week prior to hospitalization. On 10/12/2014 interventional radiology was consulted for fluoroscopic placement of NG tube. A lash lines were placed throughout this hospitalization. Further treatment with radiation therapy were postponed until 10/16/2014. Medical oncology, radiation oncology, palliative care, interventional radiology consulted during this hospitalization. We are now faced with the decision whether to proceed with replacing current NG tube with a thinner Dobhoff tube to allow for further tube feeding vs placement of gastrostomy tube. He has a history of perforated bowel that resulted in hemicolectomy and colostomy placement. On 10/16/2014 I discussed case with Dr Nevada Crane of Interventional Radiology and his Radiation oncologist Dr Pablo Ledger regarding potential of placing PEG. IR reviewed anatomy from previous CT scan in August 2016, feeling that PEG was possible.  Dr Pablo Ledger recommended involving General Surgery for their input on this issue. On exam Mr. Markgraf reports having increasing irritation in the back of his throat from the current NG tube. He denies fevers, chills, nausea, vomiting. Lungs overall clear to auscultation bilaterally. Abdomen benign, colostomy functioning well. Will await further recommendations from general surgery.

## 2014-10-16 NOTE — Progress Notes (Signed)
Nutrition Follow-up  DOCUMENTATION CODES:   Severe malnutrition in context of acute illness/injury  INTERVENTION:   Continue Vital 1.5 @ goal rate of 60 mL/hr.  Goal rate provides 2160 kcal, 97 grams protein, and 1100 mL free water.  Contiinue 100 mL free water QID to add additional 400 mL free water.  RD will continue to monitor and follow-up after MBS  NUTRITION DIAGNOSIS:   Increased nutrient needs related to catabolic illness, cancer and cancer related treatments as evidenced by estimated needs.  Ongoing.  GOAL:   Patient will meet greater than or equal to 90% of their needs  Meeting.  MONITOR:   TF tolerance, Weight trends, Labs, Skin, I & O's    ASSESSMENT:   59 y.o. 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 The patient has been receiving radiation to his neck and he is towards the end of his course of radiation. He has received chemotherapy in the past however developed anaphylaxis from it and therefore chemotherapy was discontinued. Of late the patient has been having increasing dysphagia. This was initially to solids and then to liquids. It is gotten to a point where he states that when he tries to swallow liquid, it comes up right away. He has some pain in his throat but this is not severe.  Pt in room with wife at bedside. Pt reports tolerating TF with no issue at 60 ml/hr. Pt reports 1 instance of vomiting yesterday but denies any N/V today. SLP evaluated 9/29, recommended MBS. Will monitor for results. PEG placement being considered.   Labs reviewed: CBGs: 108-125 Low BUN & Creatinine Mg WNL  Diet Order:  Diet NPO time specified  Skin:  Wound (see comment) (severe neck burn due to radiation treatment)  Last BM:  10/2  Height:   Ht Readings from Last 1 Encounters:  10/12/14 5\' 7"   (1.702 m)    Weight:   Wt Readings from Last 1 Encounters:  10/16/14 150 lb 2.1 oz (68.1 kg)    Ideal Body Weight:  67.27 kg (kg)  BMI:  Body mass index is 23.51 kg/(m^2).  Estimated Nutritional Needs:   Kcal:  2100-2350  Protein:  85-100 grams  Fluid:  >/= 2.2 L/day  EDUCATION NEEDS:   No education needs identified at this time  Clayton Bibles, MS, RD, LDN Pager: [phone] After Hours Pager: [phone]

## 2014-10-17 ENCOUNTER — Ambulatory Visit: Payer: Medicaid Other

## 2014-10-17 ENCOUNTER — Ambulatory Visit: Payer: Medicaid Other | Attending: Radiation Oncology | Admitting: Radiation Oncology

## 2014-10-17 ENCOUNTER — Ambulatory Visit: Payer: Self-pay

## 2014-10-17 ENCOUNTER — Ambulatory Visit: Payer: Self-pay | Admitting: Hematology and Oncology

## 2014-10-17 ENCOUNTER — Inpatient Hospital Stay (HOSPITAL_COMMUNITY): Payer: Medicaid Other

## 2014-10-17 ENCOUNTER — Encounter: Payer: Self-pay | Admitting: *Deleted

## 2014-10-17 ENCOUNTER — Ambulatory Visit
Admit: 2014-10-17 | Discharge: 2014-10-17 | Disposition: A | Discharge status: Home / Self Care | Payer: Medicaid Other | Attending: Radiation Oncology | Admitting: Radiation Oncology

## 2014-10-17 DIAGNOSIS — D630 Anemia in neoplastic disease: Secondary | ICD-10-CM

## 2014-10-17 DIAGNOSIS — T17908A Unspecified foreign body in respiratory tract, part unspecified causing other injury, initial encounter: Secondary | ICD-10-CM

## 2014-10-17 LAB — BASIC METABOLIC PANEL
Anion gap: 7 (ref 5–15)
BUN: 6 mg/dL (ref 6–20)
CALCIUM: 8.5 mg/dL — AB (ref 8.9–10.3)
CHLORIDE: 103 mmol/L (ref 101–111)
CO2: 28 mmol/L (ref 22–32)
CREATININE: 0.62 mg/dL (ref 0.61–1.24)
GFR calc non Af Amer: >60 mL/min (ref >60)
GLUCOSE: 109 mg/dL — AB (ref 65–99)
Potassium: 4 mmol/L (ref 3.5–5.1)
Sodium: 138 mmol/L (ref 135–145)

## 2014-10-17 LAB — CBC
HCT: 29.1 % — ABNORMAL LOW (ref 39.0–52.0)
HEMOGLOBIN: 9.4 g/dL — AB (ref 13.0–17.0)
MCH: 29.1 pg (ref 26.0–34.0)
MCHC: 32.3 g/dL (ref 30.0–36.0)
MCV: 90.1 fL (ref 78.0–100.0)
Platelets: 408 10*3/uL — ABNORMAL HIGH (ref 150–400)
RBC: 3.23 MIL/uL — ABNORMAL LOW (ref 4.22–5.81)
RDW: 15.8 % — AB (ref 11.5–15.5)
WBC: 6.2 10*3/uL (ref 4.0–10.5)

## 2014-10-17 LAB — GLUCOSE, CAPILLARY
GLUCOSE-CAPILLARY: 107 mg/dL — AB (ref 65–99)
GLUCOSE-CAPILLARY: 91 mg/dL (ref 65–99)
GLUCOSE-CAPILLARY: 95 mg/dL (ref 65–99)
GLUCOSE-CAPILLARY: 102 mg/dL — AB (ref 65–99)
Glucose-Capillary: 110 mg/dL — ABNORMAL HIGH (ref 65–99)
Glucose-Capillary: 91 mg/dL (ref 65–99)
Glucose-Capillary: 94 mg/dL (ref 65–99)

## 2014-10-17 LAB — PROTIME-INR
INR: 1 (ref 0.00–1.49)
PROTHROMBIN TIME: 13.4 s (ref 11.6–15.2)

## 2014-10-17 LAB — APTT: aPTT: 29 seconds (ref 24–37)

## 2014-10-17 LAB — PHOSPHORUS: PHOSPHORUS: 3.8 mg/dL (ref 2.5–4.6)

## 2014-10-17 MED ORDER — MIDAZOLAM HCL 2 MG/2ML IJ SOLN
INTRAMUSCULAR | Status: AC | PRN
  Administered 2014-10-17 (×2): 0.5 mg via INTRAVENOUS
  Administered 2014-10-17: 1 mg via INTRAVENOUS

## 2014-10-17 MED ORDER — OSMOLITE 1.5 CAL PO LIQD
1000.0000 mL | ORAL | Status: DC
  Administered 2014-10-18 – 2014-10-20 (×3): 1000 mL
  Filled 2014-10-17 (×4): qty 1000

## 2014-10-17 MED ORDER — FENTANYL CITRATE (PF) 100 MCG/2ML IJ SOLN
INTRAMUSCULAR | Status: AC | PRN
  Administered 2014-10-17 (×2): 25 ug via INTRAVENOUS

## 2014-10-17 MED ORDER — MIDAZOLAM HCL 2 MG/2ML IJ SOLN
INTRAMUSCULAR | Status: AC
  Filled 2014-10-17: qty 6

## 2014-10-17 MED ORDER — FENTANYL CITRATE (PF) 100 MCG/2ML IJ SOLN
INTRAMUSCULAR | Status: AC
  Filled 2014-10-17: qty 4

## 2014-10-17 MED ORDER — CEFAZOLIN SODIUM-DEXTROSE 2-3 GM-% IV SOLR
INTRAVENOUS | Status: AC
  Filled 2014-10-17: qty 50

## 2014-10-17 MED ORDER — IOHEXOL 300 MG/ML  SOLN
50.0000 mL | Freq: Once | INTRAMUSCULAR | Status: AC | PRN
  Administered 2014-10-17: 50 mL

## 2014-10-17 MED ORDER — GLUCAGON HCL RDNA (DIAGNOSTIC) 1 MG IJ SOLR
INTRAMUSCULAR | Status: AC
  Administered 2014-10-17: 1 mg
  Filled 2014-10-17: qty 1

## 2014-10-17 MED ORDER — LIDOCAINE-EPINEPHRINE 2 %-1:100000 IJ SOLN
INTRAMUSCULAR | Status: AC
  Filled 2014-10-17: qty 1

## 2014-10-17 NOTE — Progress Notes (Signed)
Physical Therapy Treatment Patient Details Name: [name] MRN: [id number] DOB: 05/10/1955 Today's Date: 10/17/2014    History of Present Illness 60 yo male admitted with fever, acute radiation burn of neck. Hx of laryngeal cancer, colostomy, CABG, HTN, MI    PT Comments    Pt is progressing well with mobility, he walked 10' with RW with supervision for safety. Instructed pt in seated BLE exercises and shoulder rolls.   Follow Up Recommendations  No PT follow up     Equipment Recommendations  None recommended by PT (pt states he has RW already)    Recommendations for Other Services       Precautions / Restrictions Precautions Precautions: Fall Restrictions Weight Bearing Restrictions: No    Mobility  Bed Mobility Overal bed mobility: Modified Independent             General bed mobility comments: HOB up 30*, used rail  Transfers Overall transfer level: Needs assistance Equipment used: Rolling walker (2 wheeled) Transfers: Sit to/from Stand           General transfer comment: verbal cues for hand placement with stand to sit  Ambulation/Gait Ambulation/Gait assistance: Supervision Ambulation Distance (Feet): 220 Feet Assistive device: Rolling walker (2 wheeled) Gait Pattern/deviations: Step-through pattern;Decreased stride length   Gait velocity interpretation: at or above normal speed for age/gender General Gait Details: steady with RW, no LOB, vital signs stable, supervision for safety 2* pt reported feeling "weak"   Stairs            Wheelchair Mobility    Modified Rankin (Stroke Patients Only)       Balance     Sitting balance-Leahy Scale: Good       Standing balance-Leahy Scale: Fair                      Cognition Arousal/Alertness: Awake/alert Behavior During Therapy: WFL for tasks assessed/performed Overall Cognitive Status: Within Functional Limits for tasks assessed                      Exercises  General Exercises - Lower Extremity Ankle Circles/Pumps: AROM;Both;10 reps;Seated Long Arc Quad: AROM;Both;10 reps;Seated Hip Flexion/Marching: AROM;Both;10 reps;Seated Other Exercises Other Exercises: shoulder rolls x 5 B seated AROM    General Comments        Pertinent Vitals/Pain Pain Score: 5  Pain Location: neck Pain Descriptors / Indicators: Sore Pain Intervention(s): Monitored during session;Limited activity within patient's tolerance    Home Living                      Prior Function            PT Goals (current goals can now be found in the care plan section) Acute Rehab PT Goals Patient Stated Goal: to hunt and fish PT Goal Formulation: With patient Time For Goal Achievement: 10/29/14 Potential to Achieve Goals: Good Progress towards PT goals: Progressing toward goals    Frequency  Min 3X/week    PT Plan Current plan remains appropriate    Co-evaluation             End of Session Equipment Utilized During Treatment: Gait belt Activity Tolerance: Patient tolerated treatment well Patient left: with call bell/phone within reach;in chair     Time: 2836-6294 PT Time Calculation (min) (ACUTE ONLY): 18 min  Charges:  $Gait Training: 8-22 mins  G Codes:      Philomena Doheny 10/17/2014, 12:41 PM [phone]

## 2014-10-17 NOTE — Progress Notes (Signed)
[facility] PROGRESS NOTE  CARA AGUINO RCV:[id number] DOB: May 12, 1955 DOA: 10/11/2014 PCP: Imelda Pillow, NP  HPI:  [name] is a 59 y.o. 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 Has been receiving radiation to his neck, nearly finished with radiation course. He was intolerant to chemotherapy (anaphylaxis) so that was discontinued. Of late, the patient has been having increasing dysphagia progressing from solids now to liquids as well. He does complain of moderate throat pain, especially when talking or swallowing. Two days prior to presentation to ED, he began having fevers up to 101 and a productive cough, was given augmentin by oncologist. Patient unable to swallow pills or tolerate PO intake so was admitted to the hospital.   Upon admission, chest x-ray reveals R side infiltrates, possibly d/t aspiration given severe dysphagia. Also of note, patient has severe scabbing and ulceration of his neck secondary to radiation therapy. There is now purulent discharge coming from wound bed and the area is causing patient significant discomfort. Patient denies CP, SOB, abdominal pain, or urinary complaints.   Subjective:   NPO for PEG tube placement   Assessment/Plan: Probable Aspiration Pneumonia: Patient has remained afebrile since admission. Blood cultures negative so far. Initially treated with broad coverage IV abx have been started (levaquin, flagyl, and vancomycin). He was transitioned to Augmentin per tube on 10/14/2014, remains nontoxic andafebrile. Last CXR done on 10/14/2014 remains stable.  Acute Radiation Dermatitis: Patient nearly finished with radiation course for laryngeal cancer, presenting with severe scabbing and ulceration of the neck secondary to this. Wound care has been consulted due to purlulent discharge coming from wound. Recommending  twice daily and PRN cleansing of the tissue with NS and gently patting dry to soften and eventually remove the dried serum (scabbing). Wound exudate will be managed with absorption and provision of wound contact with an antimicrobial textile (Dermatherapy) Wound culture grew staph aureus, which is covered by antibiotics given for PNA. Continue pain management with fentanyl patch and roxanol as needed. Would is healing well, improving daily.    Dysphagia: He presents with dysphagia having history of laryngeal cancer. NG tube was placed on 10/12/2014. -PEG tube placement 10/4 or 10/5  Squamous Cell Carcinoma of Larynx: Radiation oncology is following, plan to complete his radiation therapy.   Hypokalemia: Resolved. Likely secondary to severe dysphagia and minimal PO intake the past week. Potassium improving to 3.9 after replacement.   Hypomagnesemia. Resolved with repletion  Hypophosphatemia. His phosphorus improved to 2.9 after replacement  Severe Protein Calorie Malnutrition: Evidenced by having wt of 68.1 Kg. Will continue to replace electrolytes and provide nutrition through tube feeds. Plan to reassess capability for PO intake today.   Anxiety: Stable on ativan PRN, continue  Anemia of Chronic Disease: Continue close monitoring of CBC, Hg remains stable since admission. No indication for transfusion at this time.   DVT Prophylaxis: Lovenox Code Status: Full code Family Communication: None at bedside  Disposition Plan: home once PEG in and feedings tolerable  Consultants:  Interventional radiology  Oncology  Pulmonology  Procedures:  NG Tube Placement 10/12/2014  Antibiotics:  Levaquin 10/11/2014 >> 10/14/2014  Flagyl 10/11/2014 >> 10/14/2014  Vancomycin 10/11/2014 >>10/14/2014  Augmentin 10/14/2014 >>  Objective: Filed Vitals:   10/17/14 0454  BP: 115/69  Pulse: 103  Temp: 98.1 F (36.7 C)  Resp: 18    Intake/Output Summary (Last 24 hours) at 10/17/14 1020  Last  data filed at 10/17/14 0701  Gross per 24 hour  Intake    986 ml  Output   2200 ml  Net  -1214 ml   Filed Weights   10/15/14 0620 10/16/14 0516 10/17/14 0454  Weight: 68.5 kg (151 lb 0.2 oz) 68.1 kg (150 lb 2.1 oz) 69.4 kg (153 lb)   Exam:  General: NAD, underweight, cachectic Cardiovascular: RRR, no m/r/g Respiratory: Crackles at right base, unlabored respiratory effort Abdomen: Soft, non tender, no masses, +BS, colostomy bag in place Musculoskeletal: No LE edema, warm, full ROM in all 4  Neuro: Alert and oriented, non focal  Data Reviewed: Basic Metabolic Panel:  Recent Labs Lab 10/13/14 0544 10/14/14 0508 10/15/14 0430 10/15/14 0750 10/16/14 0605 10/17/14 0515  NA 139 139  --  136 138 138  K 3.1* 3.2*  --  3.7 3.9 4.0  CL 105 105  --  107 103 103  CO2 29 27  --  28 29 28   GLUCOSE 126* 144*  --  120* 113* 109*  BUN 5* <5*  --  <5* <5* 6  CREATININE 0.71 0.62  --  0.58* 0.52* 0.62  CALCIUM 7.9* 7.8*  --  7.9* 8.2* 8.5*  MG 1.6* 1.8  --   --  1.9  --   PHOS 1.7* 2.5 2.9  --   --  3.8   Liver Function Tests:  Recent Labs Lab 10/13/14 0544  AST 22  ALT 11*  ALKPHOS 86  BILITOT 0.5  PROT 5.3*  ALBUMIN 2.4*   No results for input(s): LIPASE, AMYLASE in the last 168 hours. No results for input(s): AMMONIA in the last 168 hours. CBC:  Recent Labs Lab 10/11/14 1612 10/12/14 1140 10/13/14 0544 10/16/14 0605 10/17/14 0515  WBC 7.7 4.9 4.5 6.1 6.2  HGB 9.2* 9.1* 9.3* 9.1* 9.4*  HCT 28.6* 27.9* 29.2* 28.7* 29.1*  MCV 92.9 92.4 91.8 89.7 90.1  PLT 273 270 290 385 408*   Cardiac Enzymes: No results for input(s): CKTOTAL, CKMB, CKMBINDEX, TROPONINI in the last 168 hours. BNP (last 3 results)  Recent Labs  08/05/14 0115  BNP 780.2*   ProBNP (last 3 results) No results for input(s): PROBNP in the last 8760 hours.  CBG:  Recent Labs Lab 10/16/14 1551 10/16/14 2109 10/17/14 0026 10/17/14 0501 10/17/14 0738  GLUCAP 101* 98 110* 107* 91     Recent Results (from the past 240 hour(s))  Culture, blood (routine x 2)     Status: None   Collection Time: 10/11/14  4:22 PM  Result Value Ref Range Status   Specimen Description BLOOD LEFT HAND  Final   Special Requests BOTTLES DRAWN AEROBIC AND ANAEROBIC 5 CC EA  Final   Culture   Final    NO GROWTH 5 DAYS Performed at [facility]    Report Status 10/16/2014 FINAL  Final  Culture, blood (routine x 2)     Status: None   Collection Time: 10/11/14  4:25 PM  Result Value Ref Range Status   Specimen Description BLOOD RIGHT HAND  Final   Special Requests BOTTLES DRAWN AEROBIC AND ANAEROBIC 5 CC EA  Final   Culture   Final    NO GROWTH 5 DAYS Performed at [facility]    Report Status 10/16/2014 FINAL  Final  Wound culture     Status: None   Collection Time: 10/12/14 11:59 AM  Result Value Ref Range Status   Specimen Description WOUND NECK  Final  Special Requests NONE  Final   Gram Stain   Final    RARE WBC PRESENT, PREDOMINANTLY PMN NO SQUAMOUS EPITHELIAL CELLS SEEN RARE GRAM POSITIVE COCCI IN PAIRS Performed at [facility]    Culture   Final    MODERATE STAPHYLOCOCCUS AUREUS Note: RIFAMPIN AND GENTAMICIN SHOULD NOT BE USED AS SINGLE DRUGS FOR TREATMENT OF STAPH INFECTIONS. Performed at [facility]    Report Status 10/15/2014 FINAL  Final   Organism ID, Bacteria STAPHYLOCOCCUS AUREUS  Final      Susceptibility   Staphylococcus aureus - MIC*    CLINDAMYCIN <=0.25 SENSITIVE Sensitive     ERYTHROMYCIN <=0.25 SENSITIVE Sensitive     GENTAMICIN <=0.5 SENSITIVE Sensitive     LEVOFLOXACIN <=0.12 SENSITIVE Sensitive     OXACILLIN <=0.25 SENSITIVE Sensitive     RIFAMPIN <=0.5 SENSITIVE Sensitive     TRIMETH/SULFA <=10 SENSITIVE Sensitive     VANCOMYCIN <=0.5 SENSITIVE Sensitive     TETRACYCLINE <=1 SENSITIVE Sensitive     MOXIFLOXACIN <=0.25 SENSITIVE Sensitive     * MODERATE STAPHYLOCOCCUS AUREUS    Studies: No results  found. Scheduled Meds: . amoxicillin-clavulanate  500 mg Per Tube 3 times per day  . antiseptic oral rinse  7 mL Mouth Rinse BID  .  ceFAZolin (ANCEF) IV  2 g Intravenous to XRAY  . enoxaparin (LOVENOX) injection  40 mg Subcutaneous Q24H  . fentaNYL  100 mcg Transdermal Q72H  . free water  100 mL Per Tube 4 times per day  . LORazepam  0.25 mg Intravenous TID  . metoprolol  2.5 mg Intravenous Q12H  . pantoprazole (PROTONIX) IV  40 mg Intravenous Q24H  . scopolamine  1 patch Transdermal Q72H  . sennosides  10 mL Oral BID  . silver sulfADIAZINE   Topical Daily   Continuous Infusions: . sodium chloride 75 mL/hr at 10/16/14 0016  . [START ON 10/18/2014] feeding supplement (OSMOLITE 1.5 CAL)     Principal Problem:   Fever in adult Active Problems:   CAD-hx of CABG '06, DES LCX 01/2013, DES SVG[facility]LAD 5/21/5 [facility]   Ischemic cardiomyopathy, EF 45%   Fever, possible aspiration-treated with antibiotics   Squamous cell carcinoma of larynx (HCC)   Cancer associated pain   Dysphagia   Laryngeal cancer (HCC)   Protein-calorie malnutrition, severe ([place])   Palliative care encounter   DNR (do not resuscitate) discussion   Pain in throat   Increased oropharyngeal secretions   Aspiration into respiratory tract   Time spent: [place], [place], DO  [facility] If 7PM-7AM, please contact night-coverage at [email or web address], password [facility] 10/17/2014, 10:20 AM  LOS: 6 days

## 2014-10-17 NOTE — Progress Notes (Signed)
[name]   DOB:02-Oct-1955   LF#:[id number]   Oncology History   Squamous cell carcinoma of larynx   Staging form: Larynx - Supraglottis, AJCC 7th Edition     Clinical stage from 07/20/2014: Stage III (T3, N0, M0) - Signed by [name], MD on 08/01/2014       Squamous cell carcinoma of larynx (HCC)   06/22/2014 Imaging CT scan showed greater than 2 cm supraglottic squamous cell carcinoma of the larynx with contralateral extension. No regional pathologic adenopathy   07/12/2014 Pathology Results Accession: [id number] biopsy of larynx show squamous cell carcinoma   07/12/2014 Surgery Laryngoscopy revealed a large exophytic tumor arising from the left supraglottic larynx, aryepiglottic fold. It extended from the anterior commissure and the epiglottis anteriorly, to the arytenoid posteriorly. The entire aryepiglottic fold was involved   07/27/2014 Imaging Staging PET:  1. Intense metabolic activity associated with the left laryngeal mass.  2. Single mildly hypermetabolic small left level 2 lymph node.  No additional evidence of metastatic adenopathy in neck.  3. No evidence of thoracic metastasis.   07/28/2014 Procedure Port-a-cath placed.   08/02/2014 - 08/04/2014 Hospital Admission Admitted to ICU following allergic rxt to Cetuximab resulting in cardiac arrest   08/02/2014 Adverse Reaction The patient received a small amount of cetuximab resulting in cardiac arrest. The medication was permanently discontinued   08/04/2014 Imaging CT scan of abdomen showed neumoperitoneum. 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    10/17/2014  Procedure PEG placed.    Subjective: He complained of severe pain, 8 out of 10 for pain in his throat and burning sensation on his skin but much improved compared to last week. There were no reported history of delirium. He is placed on NPO pending NG tube placement. Denies further fevers or chills  Objective:  Filed Vitals:   10/17/14 1738  BP: 124/60  Pulse: 87  Temp: 98.3 F (36.8 C)  Resp: 18     Intake/Output Summary (Last 24 hours) at 10/17/14 1903 Last data filed at 10/17/14 1243  Gross per 24 hour  Intake      0 ml  Output   2100 ml  Net  -2100 ml    GENERAL:alert, no distress and comfortable SKIN: significant rash and skin ulcer, stable from yesterday's assessment EYES: normal, Conjunctiva are pale  and non-injected, sclera clear OROPHARYNX:no exudate, no erythema and lips, buccal mucosa, and tongue normal. NG tube in place Musculoskeletal:no cyanosis of digits and no clubbing  NEURO: alert & oriented x 3 with fluent speech, no focal motor/sensory deficits   Labs:  Lab Results  Component Value Date   WBC 6.2 10/17/2014   HGB 9.4* 10/17/2014   HCT 29.1* 10/17/2014   MCV 90.1 10/17/2014   PLT 408* 10/17/2014   NEUTROABS 3.4 09/25/2014    Lab Results  Component Value Date   NA 138 10/17/2014   K 4.0 10/17/2014   CL 103 10/17/2014   CO2 28 10/17/2014    Studies:  Ir Gastrostomy Tube Mod Sed  10/17/2014   INDICATION: History of laryngeal cancer, now with dysphagia. Please perform percutaneous gastrostomy tube placement for enteric nutrition supplementation  EXAM: PUSH GASTROSTOMY TUBE PLACEMENT  COMPARISON:  CT abdomen pelvis - 08/22/2014  MEDICATIONS: Ancef 2 g IV.  Antibiotics were administered within 1 hour of the procedure.  CONTRAST:  20 mL of Isovue 300 administered into the gastric lumen.  ANESTHESIA/SEDATION: Versed 2 mg IV; Fentanyl 50 mcg IV.  Sedation time  16 minutes  FLUOROSCOPY TIME:  5 minutes 12 seconds (89 mGy)  COMPLICATIONS: None immediate   PROCEDURE: Informed written consent was obtained from the patient following explanation of the procedure, risks, benefits and alternatives. A time out was performed prior to the initiation of the procedure. Ultrasound scanning was performed to demarcate the edge of the left lobe of the liver. Maximal barrier sterile technique utilized including caps, mask, sterile gowns, sterile gloves, large sterile drape, hand hygiene and Betadine prep.  The left upper quadrant was sterilely prepped and draped. Existing nasogastric feeding tube was utilized for gastric insufflation. The left costal margin and air opacified transverse colon were identified and avoided. Air was injected into the stomach for insufflation and visualization under fluoroscopy. Under sterile conditions and local anesthesia, 3 T tacks were utilized to pexy the anterior aspect of the stomach against the ventral abdominal wall. Contrast injection confirmed appropriate positioning of each of the T tacks. An incision was made between the T tacks and a 17 gauge trocar needle was utilized to access the stomach. Needle position was confirmed within the stomach with aspiration of air and injection of a small amount of contrast. A stiff Glidewire was advanced into the gastric lumen and under intermittent fluoroscopic guidance, the access needle was exchanged for a Kumpe catheter. With the use of the Kumpe catheter, a stiff Glidewire was advanced into the horizontal segment of the duodenum. Under intermittent fluoroscopic guidance, the Kumpe catheter was exchanged for a telescoping peel-away sheath, ultimately allowing placement of a 20-French balloon retention gastrostomy tube. The retention balloon was insufflated with a mixture of dilute saline and contrast and pulled taut against the anterior wall of the stomach. The external disc was cinched. Contrast injection confirms positioning within the stomach. Several spot radiographic images were obtained in various  obliquities for documentation. The patient tolerated procedure well without immediate post procedural complication.  FINDINGS: After successful fluoroscopic guided placement, the gastrostomy tube is appropriately positioned with internal retention balloon against the ventral aspect of the gastric lumen.  IMPRESSION: Successful fluoroscopic insertion of an 15 French balloon retention gastrostomy tube.  The gastrostomy may be used immediately for medication administration and in 24 hrs for the initiation of feeds.   Electronically Signed   By: Sandi Mariscal M.D.   On: 10/17/2014 16:14    Assessment & Plan:    Squamous cell carcinoma of larynx I will defer to Dr. Pablo Ledger to manage his radiation treatment while hospitalized Continue supportive care.  Infection, respiratory tract, possible RLL pneumonia secondary to aspiration The patient had unresolved upper respiratory tract infection and possible skin infection. Continue IV antibiotics Overall, he is improving.  Skin ulceration He has significant skin ulceration, improving with wound care Continue to same  Protein calorie malnutrition The patient is not able to tolerate any form of oral intake. Speech therapist felt that he probably would not be able to swallow. I have discussed with the hospitalist to consider PEG tube feeding, however this could be technically challenging due to recent abdominal surgery. Awaiting IR to place tube  Uncontrollable mucositis and neck pain He is on fentanyl patch and intermittent doses of liquid morphine. He would need aggressive pain management and  I agree with involvement of palliative care service for assistance in pain management and other symptomatic management.  Anemia of chronic disease He is not symptomatic. No need for blood transfusion  Intermittent confusion & delirium, resolved I would recommend family members involvement to orient the patient and reduce the risk of delirium and  falls  Recent non-STEMI Clinically, he is not symptomatic. There is no signs and symptoms of congestive heart failure. Continue medical management  Discharge planning He had a lot of ongoing medical issues. Likely would need to be in the hospital until nutrition route is established. We will get a physical therapy assessment in the near future to determine whether it would be safe for the patient to be discharged home. Will follow  [place], [place], MD 10/17/2014  7:03 PM

## 2014-10-17 NOTE — Progress Notes (Signed)
  Oncology Nurse Navigator Documentation     Patient Visit Type: Inpatient (10/17/14 1410)   Barriers/Navigation Needs: Education (10/17/14 1410)   Interventions: Education Method (10/17/14 1410)     Education Method: Teach-back;Verbal (10/17/14 1410)      Met with patient [facility] 1322 to check on well being.  His mother-in-law Vivien Rota was present. In anticipation of PEG placement, I provided initial education on PEG use with simulation device, using verbal and teach back instruction. They verbalized understanding of gravity feed technique, Vivien Rota provided accurate return demonstration. They understand I will provide additional instruction during his admission.  [name], RN, BSN, [facility] [phone]  I    Time Spent with Patient: 30 (10/17/14 1410)

## 2014-10-17 NOTE — Procedures (Signed)
Successful fluoroscopic guided insertion of gastrostomy tube.   The gastrostomy tube may be used immediately for medications.   Tube feeds may be initiated in 24 hours as per the primary team.   No immediate post procedural complications.   Jay Markise Haymer, MD Pager #: [phone]  

## 2014-10-17 NOTE — Progress Notes (Signed)
Pt states he does not wish to go to radiation therapy today.

## 2014-10-17 NOTE — Progress Notes (Signed)
Nutrition Follow-up  DOCUMENTATION CODES:   Severe malnutrition in context of acute illness/injury  INTERVENTION:   D/C Vital 1.5  When able to start tube feeding post PEG placement: Initiate Osmolite 1.5 @ 60 ml/hr via PEG. Tube feeding regimen provides 2160 kcal (100% of needs), 90 grams of protein, and 1097 ml of H2O.  Continue 100 mL free water QID to add additional 400 mL free water.  RD to continue to monitor for TF initiation and tolerance.  NUTRITION DIAGNOSIS:   Increased nutrient needs related to catabolic illness, cancer and cancer related treatments as evidenced by estimated needs.  Ongoing.  GOAL:   Patient will meet greater than or equal to 90% of their needs  Not meeting currently.  MONITOR:   TF tolerance, Weight trends, Labs, Skin, I & O's  REASON FOR ASSESSMENT:   Malnutrition Screening Tool    ASSESSMENT:   59 y.o. 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 The patient has been receiving radiation to his neck and he is towards the end of his course of radiation. He has received chemotherapy in the past however developed anaphylaxis from it and therefore chemotherapy was discontinued. Of late the patient has been having increasing dysphagia. This was initially to solids and then to liquids. It is gotten to a point where he states that when he tries to swallow liquid, it comes up right away. He has some pain in his throat but this is not severe.  Pt scheduled to have PEG placed today. Per RN in interdisciplinary rounds, pt was tolerating Vital 1.5 @ 60 ml/hr with no issue. TF were stopped after midnight for procedure. Once able to resume TF, recommend Osmolite 1.5 @ 60 ml/hr. Unsure at this time if patient will discharge with pump or on bolus feeds.  Labs reviewed.  Diet Order:  Diet  NPO time specified  Skin:  Wound (see comment) (severe neck burn due to radiation treatment)  Last BM:  10/4  Height:   Ht Readings from Last 1 Encounters:  10/12/14 5\' 7"  (1.702 m)    Weight:   Wt Readings from Last 1 Encounters:  10/17/14 153 lb (69.4 kg)    Ideal Body Weight:  67.27 kg (kg)  BMI:  Body mass index is 23.96 kg/(m^2).  Estimated Nutritional Needs:   Kcal:  2100-2350  Protein:  90-105g  Fluid:  >/=2.2L/day  EDUCATION NEEDS:   No education needs identified at this time  Clayton Bibles, MS, RD, LDN Pager: [phone] After Hours Pager: [phone]

## 2014-10-17 NOTE — Discharge Instructions (Signed)
Care of a Feeding Tube People who have trouble swallowing or cannot take food or medicine by mouth are sometimes given feeding tubes. A feeding tube can go into the nose and down to the stomach or through the skin in the abdomen and into the stomach or small bowel. Some of the names of these feeding tubes are gastrostomy tubes, PEG lines, nasogastric tubes, and gastrojejunostomy tubes.  SUPPLIES NEEDED TO CARE FOR THE TUBE SITE  Clean gloves.  Clean wash cloth, gauze pads, or soft paper towel.  Cotton swabs.  Skin barrier ointment or cream.  Soap and water.  Pre-cut foam pads or gauze (that go around the tube).  Tube tape. TUBE SITE CARE 1. Have all supplies ready and available. 2. Wash hands well. 3. Put on clean gloves. 4. Remove the soiled foam pad or gauze, if present, that is found under the tube stabilizer. Change the foam pad or gauze daily or when soiled or moist. 5. Check the skin around the tube site for redness, rash, swelling, drainage, or extra tissue growth. If you notice any of these, call your caregiver. 6. Moisten gauze and cotton swabs with water and soap. 7. Wipe the area closest to the tube (right near the stoma) with cotton swabs. Wipe the surrounding skin with moistened gauze. Rinse with water. 8. Dry the skin and stoma site with a dry gauze pad or soft paper towel. Do not use antibiotic ointments at the tube site. 9. If the skin is red, apply a skin barrier cream or ointment (such as petroleum jelly) in a circular motion, using a cotton swab. The cream or ointment will provide a moisture barrier for the skin and helps with wound healing. 10. Apply a new pre-cut foam pad or gauze around the tube. Secure it with tape around the edges. If no drainage is present, foam pads or gauze may be left off. 11. Use tape or an anchoring device to fasten the feeding tube to the skin for comfort or as directed. Rotate where you tape the tube to avoid skin damage from the  adhesive. 12. Position the person in a semi-upright position (30-45 degree angle). 13. Throw away used supplies. 14. Remove gloves. 15. Wash hands. SUPPLIES NEEDED TO FLUSH A FEEDING TUBE  Clean gloves.  60 mL syringe (that connects to the feeding tube).  Towel.  Water. FLUSHING A FEEDING TUBE  1. Have all supplies ready and available. 2. Wash hands well. 3. Put on clean gloves. 4. Draw up 30 mL of water in the syringe. 5. Kink the feeding tube while disconnecting it from the feeding-bag tubing or while removing the plug at the end of the tube. Kinking closes the tube and prevents secretions in the tube from spilling out. 6. Insert the tip of the syringe into the end of the feeding tube. Release the kink. Slowly inject the water. 7. If unable to inject the water, the person with the feeding tube should lay on his or her left side. The tip of the tube may be against the stomach wall, blocking fluid flow. Changing positions may move the tip away from the stomach wall. After repositioning, try injecting the water again. 8. After injecting the water, remove the syringe. 9. Always flush before giving the first medicine, between medicines, and after the final medicine before starting a feeding. This prevents medicines from clogging the tube. 10. Throw away used supplies. 11. Remove gloves. 12. Wash hands. Document Released: 12/30/2004 Document Revised: 12/17/2011 Document Reviewed: 08/14/2011  ExitCare Patient Information 2015 [place]. This information is not intended to replace advice given to you by your health care provider. Make sure you discuss any questions you have with your health care provider. Gastrostomy Tube Home Guide A gastrostomy tube is a tube that is surgically placed into the stomach. It is also called a "G-tube." G-tubes are used when a person is unable to eat and drink enough on their own to stay healthy. The tube is inserted into the stomach through a small cut  (incision) in the skin. This tube is used for:  Feeding.  Giving medication. GASTROSTOMY TUBE CARE 16. Wash your hands with soap and water. 16. Remove the old dressing (if any). Some styles of G-tubes may need a dressing inserted between the skin and the G-tube. Other types of G-tubes do not require a dressing. Ask your health care provider if a dressing is needed. 18. Check the area where the tube enters the skin (insertion site) for redness, swelling, or pus-like (purulent) drainage. A small amount of clear or tan liquid drainage is normal. Check to make sure scar tissue (skin) is not growing around the insertion site. This could have a raised, bumpy appearance. 19. A cotton swab can be used to clean the skin around the tube: 1. When the G-tube is first put in, a normal saline solution or water can be used to clean the skin. 2. Mild soap and warm water can be used when the skin around the G-tube site has healed. 3. Roll the cotton swab around the G-tube insertion site to remove any drainage or crusting at the insertion site. STOMACH RESIDUALS Feeding tube residuals are the amount of liquids that are in the stomach at any given time. Residuals may be checked before giving feedings, medications, or as instructed by your health care provider.  Ask your health care provider if there are instances when you would not start tube feedings depending on the amount or type of contents withdrawn from the stomach.  Check residuals by attaching a syringe to the G-tube and pulling back on the syringe plunger. Note the amount, and return the residual back into the stomach. FLUSHING THE G-TUBE 13. The G-tube should be periodically flushed with clean warm water to keep it from clogging.  Flush the G-tube after feedings or medications. Draw up 30 mL of warm water in a syringe. Connect the syringe to the G-tube and slowly push the water into the tube.  Do not push feedings, medications, or flushes rapidly.  Flush the G-tube gently and slowly.  Only use syringes made for G-tubes to flush medications or feedings.  Your health care provider may want the G-tube flushed more often or with more water. If this is the case, follow your health care provider's instructions. FEEDINGS Your health care provider will determine whether feedings are given as a bolus (a certain amount given at one time and at scheduled times) or whether feedings will be given continuously on a feeding pump.   Formulas should be given at room temperature.  If feedings are continuous, no more than 4 hours worth of feedings should be placed in the feeding bag. This helps prevent spoilage or accidental excess infusion.  Cover and place unused formula in the refrigerator.  If feedings are continuous, stop the feedings when medications or flushes are given. Be sure to restart the feedings.  Feeding bags and syringes should be replaced as instructed by your health care provider. GIVING MEDICATION   In general,  it is best if all medications are in a liquid form for G-tube administration. Liquid medications are less likely to clog the G-tube.  Mix the liquid medication with 30 mL (or amount recommended by your health care provider) of warm water.  Draw up the medication into the syringe.  Attach the syringe to the G-tube and slowly push the mixture into the G-tube.  After giving the medication, draw up 30 mL of warm water in the syringe and slowly flush the G-tube.  For pills or capsules, check with your health care provider first before crushing medications. Some pills are not effective if they are crushed. Some capsules are sustained-release medications.  If appropriate, crush the pill or capsule and mix with 30 mL of warm water. Using the syringe, slowly push the medication through the tube, then flush the tube with another 30 mL of tap water. G-TUBE PROBLEMS G-tube was pulled out.  Cause: May have been pulled out  accidentally.  Solutions: Cover the opening with clean dressing and tape. Call your health care provider right away. The G-tube should be put in as soon as possible (within 4 hours) so the G-tube opening (tract) does not close. The G-tube needs to be put in at a health care setting. An X-ray needs to be done to confirm placement before the G-tube can be used again. Redness, irritation, soreness, or foul odor around the gastrostomy site.  Cause: May be caused by leakage or infection.  Solutions: Call your health care provider right away. Large amount of leakage of fluid or mucus-like liquid present (a large amount means it soaks clothing).  Cause: Many reasons could cause the G-tube to leak.  Solutions: Call your health care provider to discuss the amount of leakage. Skin or scar tissue appears to be growing where tube enters skin.   Cause: Tissue growth may develop around the insertion site if the G-tube is moved or pulled on excessively.  Solutions: Secure tube with tape so that excess movement does not occur. Call your health care provider. G-tube is clogged.  Cause: Thick formula or medication.  Solutions: Try to slowly push warm water into the tube with a large syringe. Never try to push any object into the tube to unclog it. Do not force fluid into the G-tube. If you are unable to unclog the tube, call your health care provider right away. TIPS  Head of bed (HOB) position refers to the upright position of a person's upper body.  When giving medications or a feeding bolus, keep the [facility] up as told by your health care provider. Do this during the feeding and for 1 hour after the feeding or medication administration.  If continuous feedings are being given, it is best to keep the [facility] up as told by your health care provider. When ADLs (activities of daily living) are performed and the [facility] needs to be flat, be sure to turn the feeding pump off. Restart the feeding pump when the [facility] is  returned to the recommended height.  Do not pull or put tension on the tube.  To prevent fluid backflow, kink the G-tube before removing the cap or disconnecting a syringe.  Check the G-tube length every day. Measure from the insertion site to the end of the G-tube. If the length is longer than previous measurements, the tube may be coming out. Call your health care provider if you notice increasing G-tube length.  Oral care, such as brushing teeth, must be continued.  You may  need to remove excess air (vent) from the G-tube. Your health care provider will tell you if this is needed.  Always call your health care provider if you have questions or problems with the G-tube. SEEK IMMEDIATE MEDICAL CARE IF:   You have severe abdominal pain, tenderness, or abdominal bloating (distension).  You have nausea or vomiting.  You are constipated or have problems moving your bowels.  The G-tube insertion site is red, swollen, has a foul smell, or has yellow or brown drainage.  You have difficulty breathing or shortness of breath.  You have a fever.  You have a large amount of feeding tube residuals.  The G-tube is clogged and cannot be flushed. MAKE SURE YOU:   Understand these instructions.  Will watch your condition.  Will get help right away if you are not doing well or get worse. Document Released: 03/10/2001 Document Revised: 05/16/2013 Document Reviewed: 09/06/2012 [facility] Patient Information 2015 [place], [place]. This information is not intended to replace advice given to you by your health care provider. Make sure you discuss any questions you have with your health care provider.

## 2014-10-18 ENCOUNTER — Ambulatory Visit: Payer: Self-pay

## 2014-10-18 ENCOUNTER — Ambulatory Visit
Admit: 2014-10-18 | Discharge: 2014-10-18 | Disposition: A | Discharge status: Home / Self Care | Payer: Medicaid Other | Attending: Radiation Oncology | Admitting: Radiation Oncology

## 2014-10-18 ENCOUNTER — Ambulatory Visit: Payer: Medicaid Other

## 2014-10-18 ENCOUNTER — Ambulatory Visit
Admission: RE | Admit: 2014-10-18 | Discharge: 2014-10-18 | Disposition: A | Discharge status: Home / Self Care | Payer: Medicaid Other | Source: Ambulatory Visit | Attending: Radiation Oncology | Admitting: Radiation Oncology

## 2014-10-18 DIAGNOSIS — R42 Dizziness and giddiness: Secondary | ICD-10-CM

## 2014-10-18 DIAGNOSIS — Z510 Encounter for antineoplastic radiation therapy: Secondary | ICD-10-CM | POA: Diagnosis not present

## 2014-10-18 LAB — GLUCOSE, CAPILLARY
GLUCOSE-CAPILLARY: 101 mg/dL — AB (ref 65–99)
GLUCOSE-CAPILLARY: 113 mg/dL — AB (ref 65–99)
Glucose-Capillary: 96 mg/dL (ref 65–99)
Glucose-Capillary: 98 mg/dL (ref 65–99)

## 2014-10-18 LAB — CREATININE, SERUM: Creatinine, Ser: 0.62 mg/dL (ref 0.61–1.24)

## 2014-10-18 NOTE — Progress Notes (Signed)
Daily Progress Note   Patient Name: [name]       Date: 10/18/2014 DOB: December 19, 1955  Age: 59 y.o. MRN#: [id number] Attending Physician: Janece Canterbury, MD Primary Care Physician: Imelda Pillow, NP Admit Date: 10/11/2014  Reason for Consultation/Follow-up: Establishing goals of care, Inpatient hospice referral, Non pain symptom management, Pain control and Psychosocial/spiritual support  Subjective:    -patient is comfortable, pain and secretions are under control, radiation dermatitis is much improved Vivien Rota at bedside and is a great support to the patient   Length of Stay: 7 days  Current Medications: Scheduled Meds:  . amoxicillin-clavulanate  500 mg Per Tube 3 times per day  . antiseptic oral rinse  7 mL Mouth Rinse BID  . enoxaparin (LOVENOX) injection  40 mg Subcutaneous Q24H  . fentaNYL  100 mcg Transdermal Q72H  . free water  100 mL Per Tube 4 times per day  . LORazepam  0.25 mg Intravenous TID  . metoprolol  2.5 mg Intravenous Q12H  . pantoprazole (PROTONIX) IV  40 mg Intravenous Q24H  . scopolamine  1 patch Transdermal Q72H  . sennosides  10 mL Oral BID  . silver sulfADIAZINE   Topical Daily    Continuous Infusions: . sodium chloride 75 mL/hr at 10/17/14 1338  . feeding supplement (OSMOLITE 1.5 CAL) 1,000 mL (10/18/14 1106)    PRN Meds: [DISCONTINUED] acetaminophen **OR** acetaminophen, albuterol, morphine CONCENTRATE, ondansetron **OR** ondansetron (ZOFRAN) IV  ECOG PERFORMANCE STATUS* (Eastern Cooperative Oncology Group)  0 Fully active, able to continue with all pre-disease activities without restriction. Pt score  1 Restricted in physically strenuous activity but ambulatory and able to carry out work of a light or sedentary nature, e.g., light house work, office work.   2 Ambulatory and capable of all self-care but unable to carry out any work activities. Up and about more than 50% of waking hours.    3 Capable of only limited self-care.  Confined to bed or chair more than 50% of waking hours. 3  4 Completely disabled. Cannot carry on any self-care. Totally confined to bed or chair.   5 Dead.    As published in Am. J. Clin. Oncol.: Eustace Pen, M.M., Colon Flattery., [place], [place]., [place], Sharen Hint., Drexel Iha, P.P.: Toxicity And Response Criteria Of The [facility] Group. College 6:389-373, 1982.  The ECOG Performance Status is in the public domain therefore available for public use. To duplicate the scale, please cite the reference above and credit the [facility], Tyler Pita M.D., Group Chair   Vital Signs: BP 128/66 mmHg  Pulse 91  Temp(Src) 98.2 F (36.8 C) (Oral)  Resp 16  Ht 5\' 7"  (1.702 m)  Wt 67.042 kg (147 lb 12.8 oz)  BMI 23.14 kg/m2  SpO2 96% SpO2: SpO2: 96 % O2 Device: O2 Device: Not Delivered O2 Flow Rate: O2 Flow Rate (L/min): 2 L/min  Intake/output summary:   Intake/Output Summary (Last 24 hours) at 10/18/14 1249 Last data filed at 10/18/14 0810  Gross per 24 hour  Intake      0 ml  Output   1100 ml  Net  -1100 ml   LBM: Last BM Date: 10/18/14 Baseline Weight: Weight: 66.225 kg (146 lb) Most recent weight: Weight: 67.042 kg (147 lb 12.8 oz)  Physical Exam:              General: chronically ill appearing HEENT: buccal membranes, moist, no exudate.   Skin: noted  radiation skin changes at base of neck, much improved, little scabbing CVS: RRR Resp: CTA Extrem: without edema Neuro: alert and orietned  Additional Data Reviewed: Recent Labs     10/16/14  0605  10/17/14  0515  10/18/14  0535  WBC  6.1  6.2   --   HGB  9.1*  9.4*   --   PLT  385  408*   --   NA  138  138   --   BUN  <5*  6   --   CREATININE  0.52*  0.62  0.62     Problem List:  Patient Active Problem List   Diagnosis Date Noted  . Aspiration into respiratory tract   . Protein-calorie malnutrition, severe ([place]) 10/13/2014  . Palliative care encounter  10/13/2014  . DNR (do not resuscitate) discussion 10/13/2014  . Pain in throat 10/13/2014  . Increased oropharyngeal secretions   . Laryngeal cancer ([place])   . Skin ulceration ([place]) 10/11/2014  . Fever in adult 10/11/2014  . Dysphagia 10/11/2014  . Infection, respiratory tract 10/10/2014  . Skin sore 10/10/2014  . Protein calorie malnutrition ([place]) 09/21/2014  . Hypotension due to drugs 09/13/2014  . Throat pain in adult 09/06/2014  . Other emphysema ([place])   . Cardiac ischemia   . Perforated bowel ([place])   . Alcohol abuse   . Peritonitis ([place])   . Hypokalemia   . Hypomagnesemia   . Troponin level elevated   . Bowel perforation ([place]) 08/05/2014  . NSTEMI (non-ST elevated myocardial infarction) ([place]) 08/05/2014  . Drug reaction   . CAD in native artery   . Sore throat 08/01/2014  . Cancer associated pain 08/01/2014  . 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 Heart failure, acute on chronic, systolic and diastolic ([place]) 97/41/6384  . CAD (coronary artery disease), autologous vein bypass graft 10/09/2013  . Dyslipidemia 10/09/2013  . PAD (peripheral artery disease) ([place]) 10/09/2013  . At risk for sudden cardiac death, has lifevest at discharge 06/20/2013  . Ischemic cardiomyopathy, EF 45% 06/20/2013  . Shock liver 06/20/2013  . Acute MI, troponin > 20, no obvious culprit vessel, NSTEMI anterior wall 06/20/2013  . Hypothermia, induced, post arrest, initially then stopped 06/20/2013  . Acute encephalopathy, improved 06/20/2013  . Dizziness 06/20/2013  . Weakness due to cardiac arrest 06/20/2013  . Fever, possible aspiration-treated with antibiotics 06/20/2013  . Anemia in chronic illness 06/17/2013  . NSVT (nonsustained ventricular tachycardia) ([place]) 06/17/2013  . CAD-hx of CABG '06, DES LCX 01/2013, DES SVG-LAD 5/21/5 at [facility] 06/17/2013  . Acute respiratory failure with hypoxia, intubated on admit, self-extubatedn 06/13/13 06/10/2013  . Cardiogenic shock ([place])  06/10/2013  . Cardiogenic pulmonary edema, improved 06/10/2013  . Cardiac arrest [facility]) 06/09/2013     Palliative Care Assessment & Plan    Code Status:  Full code  Goals of Care:  Hopeful for improvement and to continue with offered and available medical interventions to treat his cancer and prolong life. Complete radiation treatments  Hopeful for discharge home when medically stable with Home health services  Symptom Management:  Pain: Continue Fentanyl Patch 100 mcg  Utilize Roxanol 20 mg po/sl every 2 hrs prn (patient tolerated well) *  Secretions: Scopolamine patch as directed    Palliative Prophylaxis:  Stool [place]: Senna-s-  One tablet BID-nursing to changed colostomy bag for accurate I&O   Thank you for allowing the Palliative Medicine Team to assist in the care of this patient.  Time In: 0805 Time Out: 0830 Total Time  25 min Prolonged Time Billed  no     Greater than 50%  of this time was spent counseling and coordinating care related to the above assessment and plan.     Knox Royalty, NP  10/18/2014, 12:49 PM  Please contact Palliative Medicine Team phone at [phone] for questions and concerns.

## 2014-10-18 NOTE — Progress Notes (Signed)
Patient ID: HALO LASKI, male   DOB: October 27, 1955, 59 y.o.   MRN: [id number]    Referring Physician(s): [facility]/Gorsuch  Chief Complaint:  Laryngeal cancer, dysphagia  Subjective:  Pt with some abd soreness as expected, s/p perc gastrostomy tube yesterday; no other new c/o  Allergies: Cetuximab  Medications: Prior to Admission medications   Medication Sig Start Date End Date Taking? Authorizing Provider  acetaminophen (TYLENOL) 500 MG tablet Take 1,000 mg by mouth every 6 (six) hours as needed for mild pain, moderate pain or headache.   Yes Historical Provider, MD  albuterol (PROVENTIL HFA;VENTOLIN HFA) 108 (90 BASE) MCG/ACT inhaler Inhale 2 puffs into the lungs every 6 (six) hours as needed for wheezing or shortness of breath. 08/12/14  Yes Shanker Kristeen Mans, MD  ALPRAZolam Duanne Moron) 0.5 MG tablet Take 0.5 mg by mouth 3 (three) times daily as needed for anxiety.    Yes Historical Provider, MD  amoxicillin-clavulanate (AUGMENTIN) 875-125 MG tablet Take 1 tablet by mouth 2 (two) times daily. 10/10/14  Yes [name], MD  aspirin EC 81 MG tablet Take 81 mg by mouth daily.   Yes Historical Provider, MD  buPROPion (WELLBUTRIN XL) 300 MG 24 hr tablet Take 300 mg by mouth daily.   Yes Historical Provider, MD  carvedilol (COREG) 12.5 MG tablet Take 0.5 tablets (6.25 mg total) by mouth 2 (two) times daily. 08/12/14  Yes Shanker Kristeen Mans, MD  fentaNYL (DURAGESIC - DOSED MCG/HR) 100 MCG/HR Place 1 patch (100 mcg total) onto the skin every 3 (three) days. 10/03/14  Yes [name], MD  lidocaine-prilocaine (EMLA) cream Apply 1 application topically as directed. Apply to affected area once 09/05/14  Yes [name], MD  morphine (MSIR) 30 MG tablet Take 1 tablet (30 mg total) by mouth every 4 (four) hours as needed for severe pain. 10/10/14  Yes [name], MD  Morphine Sulfate (MORPHINE CONCENTRATE) 10 mg / 0.5 ml concentrated solution Take 1 mL (20 mg total) by mouth every 2 (two) hours as needed for severe  pain. 09/05/14  Yes [name], MD  pantoprazole (PROTONIX) 40 MG tablet TAKE 1 TABLET BY MOUTH EVERY DAY Patient taking differently: TAKE 40 MG BY MOUTH DAILY 04/17/14  Yes Mihai Croitoru, MD  potassium chloride SA (K-DUR,KLOR-CON) 20 MEQ tablet TAKE 20 MEQ BY MOUTH ONCE DAILY 06/08/14  Yes Historical Provider, MD  prochlorperazine (COMPAZINE) 10 MG tablet Take 1 tablet (10 mg total) by mouth every 6 (six) hours as needed for nausea or vomiting. 10/06/14  Yes [name], MD     Vital Signs: BP 128/66 mmHg  Pulse 91  Temp(Src) 98.2 F (36.8 C) (Oral)  Resp 16  Ht 5\' 7"  (1.702 m)  Wt 147 lb 12.8 oz (67.042 kg)  BMI 23.14 kg/m2  SpO2 96%  Physical Exam G tube intact, insertion site ok, old blood under disc, no leaking, mildly tender to palpation, abd soft,ND  Imaging: Ir Gastrostomy Tube Mod Sed  10/17/2014   INDICATION: History of laryngeal cancer, now with dysphagia. Please perform percutaneous gastrostomy tube placement for enteric nutrition supplementation  EXAM: PUSH GASTROSTOMY TUBE PLACEMENT  COMPARISON:  CT abdomen pelvis - 08/22/2014  MEDICATIONS: Ancef 2 g IV.  Antibiotics were administered within 1 hour of the procedure.  CONTRAST:  20 mL of Isovue 300 administered into the gastric lumen.  ANESTHESIA/SEDATION: Versed 2 mg IV; Fentanyl 50 mcg IV.  Sedation time  16 minutes  FLUOROSCOPY TIME:  5 minutes 12 seconds (89 mGy)  COMPLICATIONS:  None immediate  PROCEDURE: Informed written consent was obtained from the patient following explanation of the procedure, risks, benefits and alternatives. A time out was performed prior to the initiation of the procedure. Ultrasound scanning was performed to demarcate the edge of the left lobe of the liver. Maximal barrier sterile technique utilized including caps, mask, sterile gowns, sterile gloves, large sterile drape, hand hygiene and Betadine prep.  The left upper quadrant was sterilely prepped and draped. Existing nasogastric feeding tube was  utilized for gastric insufflation. The left costal margin and air opacified transverse colon were identified and avoided. Air was injected into the stomach for insufflation and visualization under fluoroscopy. Under sterile conditions and local anesthesia, 3 T tacks were utilized to pexy the anterior aspect of the stomach against the ventral abdominal wall. Contrast injection confirmed appropriate positioning of each of the T tacks. An incision was made between the T tacks and a 17 gauge trocar needle was utilized to access the stomach. Needle position was confirmed within the stomach with aspiration of air and injection of a small amount of contrast. A stiff Glidewire was advanced into the gastric lumen and under intermittent fluoroscopic guidance, the access needle was exchanged for a Kumpe catheter. With the use of the Kumpe catheter, a stiff Glidewire was advanced into the horizontal segment of the duodenum. Under intermittent fluoroscopic guidance, the Kumpe catheter was exchanged for a telescoping peel-away sheath, ultimately allowing placement of a 20-French balloon retention gastrostomy tube. The retention balloon was insufflated with a mixture of dilute saline and contrast and pulled taut against the anterior wall of the stomach. The external disc was cinched. Contrast injection confirms positioning within the stomach. Several spot radiographic images were obtained in various obliquities for documentation. The patient tolerated procedure well without immediate post procedural complication.  FINDINGS: After successful fluoroscopic guided placement, the gastrostomy tube is appropriately positioned with internal retention balloon against the ventral aspect of the gastric lumen.  IMPRESSION: Successful fluoroscopic insertion of an 66 French balloon retention gastrostomy tube.  The gastrostomy may be used immediately for medication administration and in 24 hrs for the initiation of feeds.   Electronically Signed    By: Sandi Mariscal M.D.   On: 10/17/2014 16:14    Labs:  CBC:  Recent Labs  10/12/14 1140 10/13/14 0544 10/16/14 0605 10/17/14 0515  WBC 4.9 4.5 6.1 6.2  HGB 9.1* 9.3* 9.1* 9.4*  HCT 27.9* 29.2* 28.7* 29.1*  PLT 270 290 385 408*    COAGS:  Recent Labs  06/22/14 1729 07/28/14 1230 10/17/14 0515  INR 0.99 0.94 1.00  APTT  --  32 29    BMP:  Recent Labs  10/14/14 0508 10/15/14 0750 10/16/14 0605 10/17/14 0515 10/18/14 0535  NA 139 136 138 138  --   K 3.2* 3.7 3.9 4.0  --   CL 105 107 103 103  --   CO2 27 28 29 28   --   GLUCOSE 144* 120* 113* 109*  --   BUN <5* <5* <5* 6  --   CALCIUM 7.8* 7.9* 8.2* 8.5*  --   CREATININE 0.62 0.58* 0.52* 0.62 0.62  GFRNONAA >60 >60 >60 >60 >60  GFRAA >60 >60 >60 >60 >60    LIVER FUNCTION TESTS:  Recent Labs  08/05/14 0115 09/25/14 0948 10/09/14 1138 10/13/14 0544  BILITOT 0.4 0.34 0.5 0.5  AST 38 34 23 22  ALT 41 25 15* 11*  ALKPHOS 71 140 115 86  PROT 5.8* 6.9 6.9  5.3*  ALBUMIN 3.1* 3.3* 3.4* 2.4*    Assessment and Plan: S/p perc balloon retention gastrostomy tube 10/4 (laryngeal ca, dysphagia); afebrile; ok to use tube;   Signed: D. Rowe Robert 10/18/2014, 10:38 AM   I spent a total of 15 minutes at the the patient's bedside AND on the patient's hospital floor or unit, greater than 50% of which was counseling/coordinating care for gastrostomy tube

## 2014-10-18 NOTE — Progress Notes (Signed)
INPATIENT Weekly Management Note Current Dose: 61 Gy  Projected Dose:70 Gy   Narrative:  The patient presents for routine under treatment assessment.  CBCT/MVCT images/Port film x-rays were reviewed.  The chart was checked. The pt was admitted to [facility] for wound care and swallowing difficulties. A peg tube was placed and his nutritional status has improved. He reports he would like to try swallowing.  Physical Findings:  Hoarse voice noted. Moist desquamation noted over the anterior neck. Lying down in his hospital bed.  Vitals:  There were no vitals filed for this visit. Weight:  Wt Readings from Last 3 Encounters:  10/18/14 147 lb 12.8 oz (67.042 kg)  10/10/14 146 lb (66.225 kg)  10/10/14 146 lb 14.4 oz (66.633 kg)   Lab Results  Component Value Date   WBC 6.2 10/17/2014   HGB 9.4* 10/17/2014   HCT 29.1* 10/17/2014   MCV 90.1 10/17/2014   PLT 408* 10/17/2014   Lab Results  Component Value Date   CREATININE 0.62 10/18/2014   BUN 6 10/17/2014   NA 138 10/17/2014   K 4.0 10/17/2014   CL 103 10/17/2014   CO2 28 10/17/2014    Impression:  The patient is tolerating radiation.  Plan:  Continue treatment as planned. I will contact the primary team to request speech pathology consultation tomorrow for evaluation.  This document serves as a record of services personally performed by [name], MD. It was created on her behalf by Darcus Austin, a trained medical scribe. The creation of this record is based on the scribe's personal observations and the provider's statements to them. This document has been checked and approved by the attending provider.

## 2014-10-18 NOTE — Progress Notes (Signed)
[facility] PROGRESS NOTE  [name] OVZ:[id number] DOB: 05/08/1955 DOA: 10/11/2014 PCP: Imelda Pillow, NP  Brief Summary  [name] is a 59 y.o. 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 Has been receiving radiation to his neck, nearly finished with radiation course. He was intolerant to chemotherapy (anaphylaxis) so that was discontinued. Of late, the patient has been having increasing dysphagia progressing from solids now to liquids as well. He does complain of moderate throat pain, especially when talking or swallowing. Two days prior to presentation to ED, he began having fevers up to 101 and a productive cough, was given augmentin by oncologist. Patient unable to swallow pills or tolerate PO intake so was admitted to the hospital.   Upon admission, chest x-ray reveals R side infiltrates, possibly d/t aspiration given severe dysphagia. Also of note, patient has severe scabbing and ulceration of his neck secondary to radiation therapy. There is now purulent discharge coming from wound bed and the area is causing patient significant discomfort. Patient denies CP, SOB, abdominal pain, or urinary complaints.   Assessment/Plan  Probable Aspiration Pneumonia: Patient has remained afebrile since admission. Blood cultures negative so far. Initially treated with broad coverage IV abx have been started (levaquin, flagyl, and vancomycin). He was transitioned to Augmentin per tube on 10/14/2014, remains nontoxic andafebrile. Last CXR done on 10/14/2014 remains stable. -  Completed 7 days of antibiotics.  Will d/c today.    Acute Radiation Dermatitis: Patient nearly finished with radiation course for laryngeal cancer, presenting with severe scabbing and ulceration of the neck secondary to this. Wound care has been consulted due to purlulent discharge coming from  wound. Recommending twice daily and PRN cleansing of the tissue with NS and gently patting dry to soften and eventually remove the dried serum (scabbing). Wound exudate will be managed with absorption and provision of wound contact with an antimicrobial textile (Dermatherapy) Wound culture grew staph aureus, which is covered by antibiotics given for PNA. Continue pain management with fentanyl patch and roxanol as needed. Would is healing well, improving daily.   Dysphagia: He presents with dysphagia having history of laryngeal cancer. NG tube was placed on 10/12/2014. 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   Hypokalemia: Resolved. Likely secondary to severe dysphagia and minimal PO intake the past week. Potassium improving to 3.9 after replacement.   Hypomagnesemia. Resolved with repletion  Hypophosphatemia. His phosphorus improved to 2.9 after replacement  Severe Protein Calorie Malnutrition: Evidenced by having wt of 68.1 Kg. Will continue to replace electrolytes and provide nutrition through tube feeds.   Anxiety: Stable on ativan PRN, continue  Anemia of Chronic Disease: Continue close monitoring of CBC, Hg remains stable since admission. No indication for transfusion at this time.   Diet:  NPO, starting tube feeds Access:  PEG tube, port IVF:  yes Proph:  lovenox  Code Status: full code Family Communication: patient alone Disposition Plan: pending speech therapy assessment tomorrow, advanced tube feeds, likely d/c in 1-2 days to home.  PT recommending to follow up.  Consultants:  Interventional radiology  Oncology  Pulmonology  Procedures:  NG Tube Placement 10/12/2014  Antibiotics:  Levaquin 10/11/2014 >> 10/14/2014  Flagyl 10/11/2014 >> 10/14/2014  Vancomycin 10/11/2014  >>  10/14/2014  Augmentin 10/14/2014 >>  HPI/Subjective:  Continues to have hoarse voice.  Has pain in the area where his PEG tube was inserted and pain medication does not last very long.  Denies nausea, vomiting, diarrhea.      Objective: Filed Vitals:   10/17/14 2103 10/18/14 0450 10/18/14 0806 10/18/14 1436  BP: 139/71 128/66  108/61  Pulse: 88 91  86  Temp: 98.3 F (36.8 C) 98.2 F (36.8 C)  98.2 F (36.8 C)  TempSrc: Oral Oral  Oral  Resp: 20 16  18   Height:      Weight:   67.042 kg (147 lb 12.8 oz)   SpO2: 98% 96%  98%    Intake/Output Summary (Last 24 hours) at 10/18/14 1440 Last data filed at 10/18/14 0810  Gross per 24 hour  Intake      0 ml  Output   1100 ml  Net  -1100 ml   Filed Weights   10/16/14 0516 10/17/14 0454 10/18/14 0806  Weight: 68.1 kg (150 lb 2.1 oz) 69.4 kg (153 lb) 67.042 kg (147 lb 12.8 oz)   Body mass index is 23.14 kg/(m^2).  Exam:   General:  Adult male, No acute distress, hoarse voice and erythema of the anterior neck and upper chest  HEENT:  NCAT, MMM  Cardiovascular:  RRR, nl S1, S2 no mrg, 2+ pulses, warm extremities  Respiratory:  CTAB, no increased WOB  Abdomen:   NABS, soft, NT/ND.  PEG tube without surrounding erythema or induraction on the left abdomen.  Small amount of dark drying blood around tube site.    MSK:   Normal tone and bulk, no LEE  Neuro:  Grossly intact  Data Reviewed: Basic Metabolic Panel:  Recent Labs Lab 10/13/14 0544 10/14/14 0508 10/15/14 0430 10/15/14 0750 10/16/14 0605 10/17/14 0515 10/18/14 0535  NA 139 139  --  136 138 138  --   K 3.1* 3.2*  --  3.7 3.9 4.0  --   CL 105 105  --  107 103 103  --   CO2 29 27  --  28 29 28   --   GLUCOSE 126* 144*  --  120* 113* 109*  --   BUN 5* <5*  --  <5* <5* 6  --   CREATININE 0.71 0.62  --  0.58* 0.52* 0.62 0.62  CALCIUM 7.9* 7.8*  --  7.9* 8.2* 8.5*  --   MG 1.6* 1.8  --   --  1.9  --   --   PHOS 1.7* 2.5 2.9  --   --  3.8  --    Liver Function  Tests:  Recent Labs Lab 10/13/14 0544  AST 22  ALT 11*  ALKPHOS 86  BILITOT 0.5  PROT 5.3*  ALBUMIN 2.4*   No results for input(s): LIPASE, AMYLASE in the last 168 hours. No results for input(s): AMMONIA in the last 168 hours. CBC:  Recent Labs Lab 10/11/14 1612 10/12/14 1140 10/13/14 0544 10/16/14 0605 10/17/14 0515  WBC 7.7 4.9 4.5 6.1 6.2  HGB 9.2* 9.1* 9.3* 9.1* 9.4*  HCT 28.6* 27.9* 29.2* 28.7* 29.1*  MCV 92.9 92.4 91.8 89.7 90.1  PLT 273 270 290 385 408*    Recent Results (from the past 240 hour(s))  Culture, blood (routine x 2)     Status: None   Collection Time: 10/11/14  4:22 PM  Result Value Ref Range Status   Specimen Description BLOOD LEFT HAND  Final   Special  Requests BOTTLES DRAWN AEROBIC AND ANAEROBIC 5 CC EA  Final   Culture   Final    NO GROWTH 5 DAYS Performed at [facility]    Report Status 10/16/2014 FINAL  Final  Culture, blood (routine x 2)     Status: None   Collection Time: 10/11/14  4:25 PM  Result Value Ref Range Status   Specimen Description BLOOD RIGHT HAND  Final   Special Requests BOTTLES DRAWN AEROBIC AND ANAEROBIC 5 CC EA  Final   Culture   Final    NO GROWTH 5 DAYS Performed at [facility]    Report Status 10/16/2014 FINAL  Final  Wound culture     Status: None   Collection Time: 10/12/14 11:59 AM  Result Value Ref Range Status   Specimen Description WOUND NECK  Final   Special Requests NONE  Final   Gram Stain   Final    RARE WBC PRESENT, PREDOMINANTLY PMN NO SQUAMOUS EPITHELIAL CELLS SEEN RARE GRAM POSITIVE COCCI IN PAIRS Performed at [facility]    Culture   Final    MODERATE STAPHYLOCOCCUS AUREUS Note: RIFAMPIN AND GENTAMICIN SHOULD NOT BE USED AS SINGLE DRUGS FOR TREATMENT OF STAPH INFECTIONS. Performed at [facility]    Report Status 10/15/2014 FINAL  Final   Organism ID, Bacteria STAPHYLOCOCCUS AUREUS  Final      Susceptibility   Staphylococcus aureus - MIC*     CLINDAMYCIN <=0.25 SENSITIVE Sensitive     ERYTHROMYCIN <=0.25 SENSITIVE Sensitive     GENTAMICIN <=0.5 SENSITIVE Sensitive     LEVOFLOXACIN <=0.12 SENSITIVE Sensitive     OXACILLIN <=0.25 SENSITIVE Sensitive     RIFAMPIN <=0.5 SENSITIVE Sensitive     TRIMETH/SULFA <=10 SENSITIVE Sensitive     VANCOMYCIN <=0.5 SENSITIVE Sensitive     TETRACYCLINE <=1 SENSITIVE Sensitive     MOXIFLOXACIN <=0.25 SENSITIVE Sensitive     * MODERATE STAPHYLOCOCCUS AUREUS     Studies: Ir Gastrostomy Tube Mod Sed  10/17/2014   INDICATION: History of laryngeal cancer, now with dysphagia. Please perform percutaneous gastrostomy tube placement for enteric nutrition supplementation  EXAM: PUSH GASTROSTOMY TUBE PLACEMENT  COMPARISON:  CT abdomen pelvis - 08/22/2014  MEDICATIONS: Ancef 2 g IV.  Antibiotics were administered within 1 hour of the procedure.  CONTRAST:  20 mL of Isovue 300 administered into the gastric lumen.  ANESTHESIA/SEDATION: Versed 2 mg IV; Fentanyl 50 mcg IV.  Sedation time  16 minutes  FLUOROSCOPY TIME:  5 minutes 12 seconds (89 mGy)  COMPLICATIONS: None immediate  PROCEDURE: Informed written consent was obtained from the patient following explanation of the procedure, risks, benefits and alternatives. A time out was performed prior to the initiation of the procedure. Ultrasound scanning was performed to demarcate the edge of the left lobe of the liver. Maximal barrier sterile technique utilized including caps, mask, sterile gowns, sterile gloves, large sterile drape, hand hygiene and Betadine prep.  The left upper quadrant was sterilely prepped and draped. Existing nasogastric feeding tube was utilized for gastric insufflation. The left costal margin and air opacified transverse colon were identified and avoided. Air was injected into the stomach for insufflation and visualization under fluoroscopy. Under sterile conditions and local anesthesia, 3 T tacks were utilized to pexy the anterior aspect of the  stomach against the ventral abdominal wall. Contrast injection confirmed appropriate positioning of each of the T tacks. An incision was made between the T tacks and a 17 gauge trocar needle was  utilized to access the stomach. Needle position was confirmed within the stomach with aspiration of air and injection of a small amount of contrast. A stiff Glidewire was advanced into the gastric lumen and under intermittent fluoroscopic guidance, the access needle was exchanged for a Kumpe catheter. With the use of the Kumpe catheter, a stiff Glidewire was advanced into the horizontal segment of the duodenum. Under intermittent fluoroscopic guidance, the Kumpe catheter was exchanged for a telescoping peel-away sheath, ultimately allowing placement of a 20-French balloon retention gastrostomy tube. The retention balloon was insufflated with a mixture of dilute saline and contrast and pulled taut against the anterior wall of the stomach. The external disc was cinched. Contrast injection confirms positioning within the stomach. Several spot radiographic images were obtained in various obliquities for documentation. The patient tolerated procedure well without immediate post procedural complication.  FINDINGS: After successful fluoroscopic guided placement, the gastrostomy tube is appropriately positioned with internal retention balloon against the ventral aspect of the gastric lumen.  IMPRESSION: Successful fluoroscopic insertion of an 53 French balloon retention gastrostomy tube.  The gastrostomy may be used immediately for medication administration and in 24 hrs for the initiation of feeds.   Electronically Signed   By: Sandi Mariscal M.D.   On: 10/17/2014 16:14    Scheduled Meds: . amoxicillin-clavulanate  500 mg Per Tube 3 times per day  . antiseptic oral rinse  7 mL Mouth Rinse BID  . enoxaparin (LOVENOX) injection  40 mg Subcutaneous Q24H  . fentaNYL  100 mcg Transdermal Q72H  . free water  100 mL Per Tube 4 times  per day  . LORazepam  0.25 mg Intravenous TID  . metoprolol  2.5 mg Intravenous Q12H  . pantoprazole (PROTONIX) IV  40 mg Intravenous Q24H  . scopolamine  1 patch Transdermal Q72H  . sennosides  10 mL Oral BID  . silver sulfADIAZINE   Topical Daily   Continuous Infusions: . sodium chloride 75 mL/hr at 10/17/14 1338  . feeding supplement (OSMOLITE 1.5 CAL) 1,000 mL (10/18/14 1106)    Principal Problem:   Fever in adult Active Problems:   CAD-hx of CABG '06, DES LCX 01/2013, DES SVG-LAD 5/21/5 at [facility]   Ischemic cardiomyopathy, EF 45%   Fever, possible aspiration-treated with antibiotics   Squamous cell carcinoma of larynx (HCC)   Cancer associated pain   Dysphagia   Laryngeal cancer (HCC)   Protein-calorie malnutrition, severe ([place])   Palliative care encounter   DNR (do not resuscitate) discussion   Pain in throat   Increased oropharyngeal secretions   Aspiration into respiratory tract    Time spent: 30 min    Emaya Preston, [facility] Pager [phone]. If 7PM-7AM, please contact night-coverage at [email or web address], password [facility] 10/18/2014, 2:40 PM  LOS: 7 days

## 2014-10-18 NOTE — Progress Notes (Signed)
[name]   DOB:07-Aug-1955   IH#:[id number]    Subjective: He tolerated procedure well. NG tube was removed and he had feeding tube placed by interventional radiologist yesterday. Denies any cough. No reported confusions. He had persistent neck pain, stable.  Objective:  Filed Vitals:   10/18/14 0450  BP: 128/66  Pulse: 91  Temp: 98.2 F (36.8 C)  Resp: 16     Intake/Output Summary (Last 24 hours) at 10/18/14 0926 Last data filed at 10/18/14 0810  Gross per 24 hour  Intake      0 ml  Output   1550 ml  Net  -1550 ml    GENERAL:alert, no distress and comfortable SKIN: Persistent skin rash around his neck, stable. EYES: normal, Conjunctiva are pink and non-injected, sclera clear OROPHARYNX:no exudate, no erythema and lips, buccal mucosa, and tongue normal . Joint mucous membrane is noted ABDOMEN:abdomen soft, non-tender and normal bowel sounds. Feeding tube site looks okay. Colostomy site looks okay Musculoskeletal:no cyanosis of digits and no clubbing  NEURO: alert & oriented x 3 with fluent speech, no focal motor/sensory deficits   Labs:  Lab Results  Component Value Date   WBC 6.2 10/17/2014   HGB 9.4* 10/17/2014   HCT 29.1* 10/17/2014   MCV 90.1 10/17/2014   PLT 408* 10/17/2014   NEUTROABS 3.4 09/25/2014    Lab Results  Component Value Date   NA 138 10/17/2014   K 4.0 10/17/2014   CL 103 10/17/2014   CO2 28 10/17/2014    Studies:  Ir Gastrostomy Tube Mod Sed  10/17/2014   INDICATION: History of laryngeal cancer, now with dysphagia. Please perform percutaneous gastrostomy tube placement for enteric nutrition supplementation  EXAM: PUSH GASTROSTOMY TUBE PLACEMENT  COMPARISON:  CT abdomen pelvis - 08/22/2014  MEDICATIONS: Ancef 2 g IV.  Antibiotics were administered within 1 hour of the procedure.  CONTRAST:  20 mL of Isovue 300 administered into the gastric lumen.  ANESTHESIA/SEDATION: Versed 2 mg IV; Fentanyl 50 mcg IV.  Sedation time  16 minutes  FLUOROSCOPY  TIME:  5 minutes 12 seconds (89 mGy)  COMPLICATIONS: None immediate  PROCEDURE: Informed written consent was obtained from the patient following explanation of the procedure, risks, benefits and alternatives. A time out was performed prior to the initiation of the procedure. Ultrasound scanning was performed to demarcate the edge of the left lobe of the liver. Maximal barrier sterile technique utilized including caps, mask, sterile gowns, sterile gloves, large sterile drape, hand hygiene and Betadine prep.  The left upper quadrant was sterilely prepped and draped. Existing nasogastric feeding tube was utilized for gastric insufflation. The left costal margin and air opacified transverse colon were identified and avoided. Air was injected into the stomach for insufflation and visualization under fluoroscopy. Under sterile conditions and local anesthesia, 3 T tacks were utilized to pexy the anterior aspect of the stomach against the ventral abdominal wall. Contrast injection confirmed appropriate positioning of each of the T tacks. An incision was made between the T tacks and a 17 gauge trocar needle was utilized to access the stomach. Needle position was confirmed within the stomach with aspiration of air and injection of a small amount of contrast. A stiff Glidewire was advanced into the gastric lumen and under intermittent fluoroscopic guidance, the access needle was exchanged for a Kumpe catheter. With the use of the Kumpe catheter, a stiff Glidewire was advanced into the horizontal segment of the duodenum. Under intermittent fluoroscopic guidance, the Kumpe catheter was exchanged for  a telescoping peel-away sheath, ultimately allowing placement of a 20-French balloon retention gastrostomy tube. The retention balloon was insufflated with a mixture of dilute saline and contrast and pulled taut against the anterior wall of the stomach. The external disc was cinched. Contrast injection confirms positioning within the  stomach. Several spot radiographic images were obtained in various obliquities for documentation. The patient tolerated procedure well without immediate post procedural complication.  FINDINGS: After successful fluoroscopic guided placement, the gastrostomy tube is appropriately positioned with internal retention balloon against the ventral aspect of the gastric lumen.  IMPRESSION: Successful fluoroscopic insertion of an 41 French balloon retention gastrostomy tube.  The gastrostomy may be used immediately for medication administration and in 24 hrs for the initiation of feeds.   Electronically Signed   By: [name] M.D.   On: 10/17/2014 16:14    Assessment & Plan:   Squamous cell carcinoma of larynx I will defer to Dr. Pablo [name] to manage his radiation treatment while hospitalized Continue supportive care.  Infection, respiratory tract, possible RLL pneumonia secondary to aspiration The patient had unresolved upper respiratory tract infection and possible skin infection. Continue IV antibiotics Overall, he is improving.  Skin ulceration He has significant skin ulceration, improving with wound care Continue the same  Protein calorie malnutrition The patient is not able to tolerate any form of oral intake. Speech therapist felt that he probably would not be able to swallow. He has placement of feeding tube. Hopefully we can resume tube feeds today.  Uncontrollable mucositis and neck pain He is on fentanyl patch and intermittent doses of liquid morphine. He would need aggressive pain management and I agree with involvement of palliative care service for assistance in pain management and other symptomatic management.  Anemia of chronic disease He is not symptomatic. No need for blood transfusion  Intermittent confusion & delirium, resolved I would recommend family members involvement to orient the patient and reduce the risk of delirium and falls  Recent non-STEMI Clinically, he  is not symptomatic. There is no signs and symptoms of congestive heart failure. Continue medical management  Discharge planning He had a lot of ongoing medical issues. We will get a physical therapy assessment in the near future to determine whether it would be safe for the patient to be discharged home safely. I anticipate possibly discharge at the end of the week. Will follow   Celada, Sobieski, MD 10/18/2014  9:26 AM

## 2014-10-18 NOTE — Progress Notes (Signed)
Nutrition Follow-up  DOCUMENTATION CODES:   Severe malnutrition in context of acute illness/injury  INTERVENTION:   Initiate Osmolite 1.5 @ 60 ml/hr via PEG. Tube feeding regimen provides 2160 kcal (100% of needs), 90 grams of protein, and 1097 ml of H2O.  Continue 100 mL free water QID to add additional 400 mL free water.  RD to continue to monitor for TF initiation and tolerance.  NUTRITION DIAGNOSIS:   Increased nutrient needs related to catabolic illness, cancer and cancer related treatments as evidenced by estimated needs.  Ongoing.  GOAL:   Patient will meet greater than or equal to 90% of their needs  Not meeting.  MONITOR:   TF tolerance, Weight trends, Labs, Skin, I & O's  REASON FOR ASSESSMENT:   Consult Enteral/tube feeding initiation and management  ASSESSMENT:   59 y.o. 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 The patient has been receiving radiation to his neck and he is towards the end of his course of radiation. He has received chemotherapy in the past however developed anaphylaxis from it and therefore chemotherapy was discontinued. Of late the patient has been having increasing dysphagia. This was initially to solids and then to liquids. It is gotten to a point where he states that when he tries to swallow liquid, it comes up right away. He has some pain in his throat but this is not severe.  Pt reports some pain where PEG tube was placed. However, he seems eager to start feedings and is still deciding about his radiation treatments.  Feedings to resume today with Osmolite 1.5 @ goal of 60 ml/hr. Will monitor for tolerance.  Labs reviewed.  Diet Order:  Diet NPO time specified  Skin:  Wound (see comment) (severe neck burn due to radiation treatment)  Last BM:  10/5  Height:    Ht Readings from Last 1 Encounters:  10/12/14 5\' 7"  (1.702 m)    Weight:   Wt Readings from Last 1 Encounters:  10/18/14 147 lb 12.8 oz (67.042 kg)    Ideal Body Weight:  67.27 kg (kg)  BMI:  Body mass index is 23.14 kg/(m^2).  Estimated Nutritional Needs:   Kcal:  2100-2350  Protein:  90-105g  Fluid:  >/=2.2L/day  EDUCATION NEEDS:   No education needs identified at this time  Clayton Bibles, MS, RD, LDN Pager: [phone] After Hours Pager: [phone]

## 2014-10-19 ENCOUNTER — Encounter: Payer: Self-pay | Admitting: *Deleted

## 2014-10-19 ENCOUNTER — Ambulatory Visit
Admission: RE | Admit: 2014-10-19 | Discharge: 2014-10-19 | Disposition: A | Discharge status: Home / Self Care | Payer: Medicaid Other | Source: Ambulatory Visit | Attending: Radiation Oncology | Admitting: Radiation Oncology

## 2014-10-19 ENCOUNTER — Ambulatory Visit: Payer: Self-pay | Admitting: Hematology and Oncology

## 2014-10-19 ENCOUNTER — Ambulatory Visit: Payer: Self-pay

## 2014-10-19 ENCOUNTER — Ambulatory Visit: Payer: Medicaid Other

## 2014-10-19 DIAGNOSIS — Z510 Encounter for antineoplastic radiation therapy: Secondary | ICD-10-CM | POA: Diagnosis not present

## 2014-10-19 LAB — GLUCOSE, CAPILLARY
GLUCOSE-CAPILLARY: 108 mg/dL — AB (ref 65–99)
GLUCOSE-CAPILLARY: 114 mg/dL — AB (ref 65–99)
Glucose-Capillary: 131 mg/dL — ABNORMAL HIGH (ref 65–99)
Glucose-Capillary: 104 mg/dL — ABNORMAL HIGH (ref 65–99)
Glucose-Capillary: 118 mg/dL — ABNORMAL HIGH (ref 65–99)
Glucose-Capillary: 112 mg/dL — ABNORMAL HIGH (ref 65–99)

## 2014-10-19 NOTE — Progress Notes (Signed)
[facility] PROGRESS NOTE  BENIGNO CHECK KDT:[id number] DOB: 1955/11/20 DOA: 10/11/2014 PCP: Imelda Pillow, NP  Brief Summary  [name] is a 59 y.o. 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 Has been receiving radiation to his neck, nearly finished with radiation course. He was intolerant to chemotherapy (anaphylaxis) so that was discontinued. Of late, the patient has been having increasing dysphagia progressing from solids now to liquids as well. He does complain of moderate throat pain, especially when talking or swallowing. Two days prior to presentation to ED, he began having fevers up to 101 and a productive cough, was given augmentin by oncologist. Patient unable to swallow pills or tolerate PO intake so was admitted to the hospital.   Upon admission, chest x-ray reveals R side infiltrates, possibly d/t aspiration given severe dysphagia. Also of note, patient has severe scabbing and ulceration of his neck secondary to radiation therapy. There is now purulent discharge coming from wound bed and the area is causing patient significant discomfort. Patient denies CP, SOB, abdominal pain, or urinary complaints.   Assessment/Plan  Probable Aspiration Pneumonia: Patient has remained afebrile since admission. Blood cultures negative so far. Initially treated with broad coverage IV abx have been started (levaquin, flagyl, and vancomycin). He was transitioned to Augmentin per tube on 10/14/2014, remains nontoxic andafebrile. Last CXR done on 10/14/2014 remains stable. -  Completed 7 days of antibiotics  Acute Radiation Dermatitis: Patient nearly finished with radiation course for laryngeal cancer, presenting with severe scabbing and ulceration of the neck secondary to this. Wound care has been consulted due to purlulent discharge coming from wound. Recommending  twice daily and PRN cleansing of the tissue with NS and gently patting dry to soften and eventually remove the dried serum (scabbing). Wound exudate will be managed with absorption and provision of wound contact with an antimicrobial textile (Dermatherapy) Wound culture grew staph aureus, which is covered by antibiotics given for PNA. Continue pain management with fentanyl patch and roxanol as needed. Would is healing well, improving daily.   Dysphagia: He presents with dysphagia having history of laryngeal cancer. NG tube was placed on 10/12/2014. - PEG tube placed 10/4  - tolerating tube feeds - Swallow evaluation today  Squamous Cell Carcinoma of Larynx: Radiation oncology is following -  Has double, carefully timed dose of radiation on 10/7 that radiology is requesting he remain inpatient for, then d/c home tomorrow evening.  Hypokalemia: Resolved. Likely secondary to severe dysphagia and minimal PO intake the past week. Potassium improving to 3.9 after replacement.   Hypomagnesemia. Resolved with repletion  Hypophosphatemia. His phosphorus improved to 2.9 after replacement  Severe Protein Calorie Malnutrition: Evidenced by having wt of 68.1 Kg. Will continue to replace electrolytes and provide nutrition through tube feeds.  -  Repeat BMP with mag and phos in AM  Anxiety: Stable on ativan PRN, continue  Anemia of Chronic Disease: Continue close monitoring of CBC, Hg remains stable since admission. No indication for transfusion at this time.   Diet:  NPO, starting tube feeds Access:  PEG tube, port IVF:  off Proph:  lovenox  Code Status: full code Family Communication: patient alone Disposition Plan: home tomorrow with [facility] RN to assist with tube feeds after second dose of XRT   Consultants:  Interventional radiology  Oncology  Pulmonology  Procedures:  NG Tube Placement 10/12/2014  Antibiotics:  Levaquin 10/11/2014 >> 10/14/2014  Flagyl 10/11/2014 >>  10/14/2014  Vancomycin 10/11/2014 >>10/14/2014  Augmentin 10/14/2014 >> 10/5  HPI/Subjective:  Pain in the area where his PEG tube has improved and he is tolerating tube feeds.  Denies nausea, vomiting, diarrhea.      Objective: Filed Vitals:   10/18/14 0806 10/18/14 1436 10/18/14 2017 10/19/14 0458  BP:  108/61 100/58 112/54  Pulse:  86 103 87  Temp:  98.2 F (36.8 C) 98.2 F (36.8 C) 98.1 F (36.7 C)  TempSrc:  Oral Oral Oral  Resp:  18 20 20   Height:      Weight: 67.042 kg (147 lb 12.8 oz)     SpO2:  98% 98%     Intake/Output Summary (Last 24 hours) at 10/19/14 1402 Last data filed at 10/19/14 1257  Gross per 24 hour  Intake      0 ml  Output   1500 ml  Net  -1500 ml   Filed Weights   10/16/14 0516 10/17/14 0454 10/18/14 0806  Weight: 68.1 kg (150 lb 2.1 oz) 69.4 kg (153 lb) 67.042 kg (147 lb 12.8 oz)   Body mass index is 23.14 kg/(m^2).  Exam:   General:  Adult male, No acute distress, hoarse voice and erythema of the anterior neck and upper chest  HEENT:  NCAT, MMM  Cardiovascular:  RRR, nl S1, S2 no mrg, 2+ pulses, warm extremities  Respiratory:  CTAB, no increased WOB  Abdomen:   NABS, soft, NT/ND.  PEG tube without surrounding erythema or induraction on the left abdomen.  Small amount of dark drying blood around tube site.  Ostomy bag full of soft liquid brown-yellow stool  MSK:   Normal tone and bulk, no LEE  Neuro:  Grossly intact  Data Reviewed: Basic Metabolic Panel:  Recent Labs Lab 10/13/14 0544 10/14/14 0508 10/15/14 0430 10/15/14 0750 10/16/14 0605 10/17/14 0515 10/18/14 0535  NA 139 139  --  136 138 138  --   K 3.1* 3.2*  --  3.7 3.9 4.0  --   CL 105 105  --  107 103 103  --   CO2 29 27  --  28 29 28   --   GLUCOSE 126* 144*  --  120* 113* 109*  --   BUN 5* <5*  --  <5* <5* 6  --   CREATININE 0.71 0.62  --  0.58* 0.52* 0.62 0.62  CALCIUM 7.9* 7.8*  --  7.9* 8.2* 8.5*  --   MG 1.6* 1.8  --   --  1.9  --   --   PHOS 1.7* 2.5 2.9   --   --  3.8  --    Liver Function Tests:  Recent Labs Lab 10/13/14 0544  AST 22  ALT 11*  ALKPHOS 86  BILITOT 0.5  PROT 5.3*  ALBUMIN 2.4*   No results for input(s): LIPASE, AMYLASE in the last 168 hours. No results for input(s): AMMONIA in the last 168 hours. CBC:  Recent Labs Lab 10/13/14 0544 10/16/14 0605 10/17/14 0515  WBC 4.5 6.1 6.2  HGB 9.3* 9.1* 9.4*  HCT 29.2* 28.7* 29.1*  MCV 91.8 89.7 90.1  PLT 290 385 408*    Recent Results (from the past 240 hour(s))  Culture, blood (routine x 2)     Status: None   Collection Time: 10/11/14  4:22 PM  Result Value Ref Range Status   Specimen Description BLOOD LEFT HAND  Final   Special Requests BOTTLES DRAWN AEROBIC AND ANAEROBIC 5  CC EA  Final   Culture   Final    NO GROWTH 5 DAYS Performed at [facility]    Report Status 10/16/2014 FINAL  Final  Culture, blood (routine x 2)     Status: None   Collection Time: 10/11/14  4:25 PM  Result Value Ref Range Status   Specimen Description BLOOD RIGHT HAND  Final   Special Requests BOTTLES DRAWN AEROBIC AND ANAEROBIC 5 CC EA  Final   Culture   Final    NO GROWTH 5 DAYS Performed at [facility]    Report Status 10/16/2014 FINAL  Final  Wound culture     Status: None   Collection Time: 10/12/14 11:59 AM  Result Value Ref Range Status   Specimen Description WOUND NECK  Final   Special Requests NONE  Final   Gram Stain   Final    RARE WBC PRESENT, PREDOMINANTLY PMN NO SQUAMOUS EPITHELIAL CELLS SEEN RARE GRAM POSITIVE COCCI IN PAIRS Performed at [facility]    Culture   Final    MODERATE STAPHYLOCOCCUS AUREUS Note: RIFAMPIN AND GENTAMICIN SHOULD NOT BE USED AS SINGLE DRUGS FOR TREATMENT OF STAPH INFECTIONS. Performed at [facility]    Report Status 10/15/2014 FINAL  Final   Organism ID, Bacteria STAPHYLOCOCCUS AUREUS  Final      Susceptibility   Staphylococcus aureus - MIC*    CLINDAMYCIN <=0.25 SENSITIVE Sensitive      ERYTHROMYCIN <=0.25 SENSITIVE Sensitive     GENTAMICIN <=0.5 SENSITIVE Sensitive     LEVOFLOXACIN <=0.12 SENSITIVE Sensitive     OXACILLIN <=0.25 SENSITIVE Sensitive     RIFAMPIN <=0.5 SENSITIVE Sensitive     TRIMETH/SULFA <=10 SENSITIVE Sensitive     VANCOMYCIN <=0.5 SENSITIVE Sensitive     TETRACYCLINE <=1 SENSITIVE Sensitive     MOXIFLOXACIN <=0.25 SENSITIVE Sensitive     * MODERATE STAPHYLOCOCCUS AUREUS     Studies: Ir Gastrostomy Tube Mod Sed  10/17/2014   INDICATION: History of laryngeal cancer, now with dysphagia. Please perform percutaneous gastrostomy tube placement for enteric nutrition supplementation  EXAM: PUSH GASTROSTOMY TUBE PLACEMENT  COMPARISON:  CT abdomen pelvis - 08/22/2014  MEDICATIONS: Ancef 2 g IV.  Antibiotics were administered within 1 hour of the procedure.  CONTRAST:  20 mL of Isovue 300 administered into the gastric lumen.  ANESTHESIA/SEDATION: Versed 2 mg IV; Fentanyl 50 mcg IV.  Sedation time  16 minutes  FLUOROSCOPY TIME:  5 minutes 12 seconds (89 mGy)  COMPLICATIONS: None immediate  PROCEDURE: Informed written consent was obtained from the patient following explanation of the procedure, risks, benefits and alternatives. A time out was performed prior to the initiation of the procedure. Ultrasound scanning was performed to demarcate the edge of the left lobe of the liver. Maximal barrier sterile technique utilized including caps, mask, sterile gowns, sterile gloves, large sterile drape, hand hygiene and Betadine prep.  The left upper quadrant was sterilely prepped and draped. Existing nasogastric feeding tube was utilized for gastric insufflation. The left costal margin and air opacified transverse colon were identified and avoided. Air was injected into the stomach for insufflation and visualization under fluoroscopy. Under sterile conditions and local anesthesia, 3 T tacks were utilized to pexy the anterior aspect of the stomach against the ventral abdominal wall.  Contrast injection confirmed appropriate positioning of each of the T tacks. An incision was made between the T tacks and a 17 gauge trocar needle was utilized to access the stomach. Needle position  was confirmed within the stomach with aspiration of air and injection of a small amount of contrast. A stiff Glidewire was advanced into the gastric lumen and under intermittent fluoroscopic guidance, the access needle was exchanged for a Kumpe catheter. With the use of the Kumpe catheter, a stiff Glidewire was advanced into the horizontal segment of the duodenum. Under intermittent fluoroscopic guidance, the Kumpe catheter was exchanged for a telescoping peel-away sheath, ultimately allowing placement of a 20-French balloon retention gastrostomy tube. The retention balloon was insufflated with a mixture of dilute saline and contrast and pulled taut against the anterior wall of the stomach. The external disc was cinched. Contrast injection confirms positioning within the stomach. Several spot radiographic images were obtained in various obliquities for documentation. The patient tolerated procedure well without immediate post procedural complication.  FINDINGS: After successful fluoroscopic guided placement, the gastrostomy tube is appropriately positioned with internal retention balloon against the ventral aspect of the gastric lumen.  IMPRESSION: Successful fluoroscopic insertion of an 9 French balloon retention gastrostomy tube.  The gastrostomy may be used immediately for medication administration and in 24 hrs for the initiation of feeds.   Electronically Signed   By: Sandi Mariscal M.D.   On: 10/17/2014 16:14    Scheduled Meds: . antiseptic oral rinse  7 mL Mouth Rinse BID  . enoxaparin (LOVENOX) injection  40 mg Subcutaneous Q24H  . fentaNYL  100 mcg Transdermal Q72H  . free water  100 mL Per Tube 4 times per day  . LORazepam  0.25 mg Intravenous TID  . metoprolol  2.5 mg Intravenous Q12H  . pantoprazole  (PROTONIX) IV  40 mg Intravenous Q24H  . scopolamine  1 patch Transdermal Q72H  . sennosides  10 mL Oral BID  . silver sulfADIAZINE   Topical Daily   Continuous Infusions: . sodium chloride 75 mL/hr at 10/19/14 0432  . feeding supplement (OSMOLITE 1.5 CAL) 1,000 mL (10/19/14 0432)    Principal Problem:   Fever in adult Active Problems:   CAD-hx of CABG '06, DES LCX 01/2013, DES SV[facility]LAD 5/21/5 [facility]   Ischemic cardiomyopathy, EF 45%   Fever, possible aspiration-treated with antibiotics   Squamous cell carcinoma of larynx (HCC)   Cancer associated pain   Dysphagia   Laryngeal cancer (HCC)   Protein-calorie malnutrition, severe ([place])   Palliative care encounter   DNR (do not resuscitate) discussion   Pain in throat   Increased oropharyngeal secretions   Aspiration into respiratory tract    Time spent: 30 min    Jesslynn Kruck, [place] [facility] Pager [phone]. If 7PM-7AM, please contact night-coverage at [email or web address], password [facility] 10/19/2014, 2:02 PM  LOS: 8 days

## 2014-10-19 NOTE — Progress Notes (Signed)
Nutrition Follow-up  DOCUMENTATION CODES:   Severe malnutrition in context of acute illness/injury  INTERVENTION:   -Provided Home Tube Feeding booklet -Continue Osmolite 1.5 @ 60 ml/hr via PEG. -Tube feeding regimen provides 2160 kcal (100% of needs), 90 grams of protein, and 1097 ml of H2O.  -Recommend 200 mL free water QID to add additional 800 mL free water.  NUTRITION DIAGNOSIS:   Increased nutrient needs related to catabolic illness, cancer and cancer related treatments as evidenced by estimated needs.  Ongoing.  GOAL:   Patient will meet greater than or equal to 90% of their needs  Meeting.  MONITOR:   TF tolerance, Weight trends, Labs, Skin, I & O's  ASSESSMENT:   59 y.o. 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 The patient has been receiving radiation to his neck and he is towards the end of his course of radiation. He has received chemotherapy in the past however developed anaphylaxis from it and therefore chemotherapy was discontinued. Of late the patient has been having increasing dysphagia. This was initially to solids and then to liquids. It is gotten to a point where he states that when he tries to swallow liquid, it comes up right away. He has some pain in his throat but this is not severe.  Pt in room waiting to be taken to radiation. Pt reports tolerating Osmolite 1.5 @ goal rate of 60 with no issues. Pt may discharge home today. Pt to be provided a pump for tube feedings at home. Provided recommendations to patient and home tube feeding booklet to patient. Pt with few questions at this time.  Labs reviewed.  Diet Order:  Diet NPO time specified  Skin:  Wound (see comment) (severe neck burn due to radiation treatment)  Last BM:  10/5  Height:   Ht Readings from Last 1 Encounters:   10/12/14 5\' 7"  (1.702 m)    Weight:   Wt Readings from Last 1 Encounters:  10/18/14 147 lb 12.8 oz (67.042 kg)    Ideal Body Weight:  67.27 kg (kg)  BMI:  Body mass index is 23.14 kg/(m^2).  Estimated Nutritional Needs:   Kcal:  2100-2350  Protein:  90-105g  Fluid:  >/=2.2L/day  EDUCATION NEEDS:   No education needs identified at this time  Clayton Bibles, MS, RD, LDN Pager: [phone] After Hours Pager: [phone]

## 2014-10-19 NOTE — Care Management Note (Signed)
Case Management Note  Patient Details  Name: [name] MRN: [id number] Date of Birth: 22-Feb-1955  Subjective/Objective:     59 yo admitted with Fever. Hx of Laryngeal CA               Action/Plan: From home with mother in law.  Expected Discharge Date:   (unknown)               Expected Discharge Plan:  [place]  In-House Referral:     Discharge planning Services  CM Consult  Post Acute Care Choice:  Home Health Choice offered to:  Patient  DME Arranged:  Tube feeding pump, Tube feeding DME Agency:  [place]:  RN [facility] Agency:     Status of Service:  In process, will continue to follow  Medicare Important Message Given:    Date Medicare IM Given:    Medicare IM give by:    Date Additional Medicare IM Given:    Additional Medicare Important Message give by:     If discussed at [place] of Stay Meetings, dates discussed:    Additional Comments: Pt is active with [facility] for HHRN/PT. Pt has new peg tube and colostomy. This CM met with pt at bedside to discuss DC planning. Pt understands he will need to go home with TF and is willing to learn how to administer it himself with help of HHRN. Pt states mother in law can also help. [facility] aware of new TF and ostomy and will continue services at DC. Will need MD order for [facility]. [facility] DME rep aware of order for tube feeding pump for home. Lynnell Catalan, RN 10/19/2014, 10:59 AM

## 2014-10-19 NOTE — Progress Notes (Signed)
Nutrition Brief Note  RD contacted by case manager regarding need to switch tube feedings from continuous to bolus feeds.  Bolus feed recommendations: Provide 1 can (231ml) of Osmolite 1.5, 6 cans daily. (0600, 0900, 1200, 1500, 1800, 2100) Recommend free water flushes of 240 ml QID.  Tube feeding regimen provides 2130 kcal (100% of needs), 90g protein, and 2046 ml free water.  If nutrition issues/questions arise, please contact RD.   Clayton Bibles, MS, RD, LDN Pager: [phone] After Hours Pager: [phone]

## 2014-10-19 NOTE — Progress Notes (Signed)
[facility] Therapy Treatment Record Phone [phone]   Radiation Therapy was administered to [name] on: 10/19/2014  1:55 PM and was treatment # 32 out of a planned course of 35 treatments.

## 2014-10-19 NOTE — Progress Notes (Signed)
PT Cancellation Note  Patient Details Name: [name] MRN: [id number] DOB: [birth year]   Cancelled Treatment:     Pt stated he walked earlier and now requests to rest.    Nathanial Rancher 10/19/2014, 3:54 PM

## 2014-10-20 ENCOUNTER — Ambulatory Visit
Admission: RE | Admit: 2014-10-20 | Discharge: 2014-10-20 | Disposition: A | Discharge status: Home / Self Care | Payer: Medicaid Other | Source: Ambulatory Visit | Attending: Radiation Oncology | Admitting: Radiation Oncology

## 2014-10-20 ENCOUNTER — Ambulatory Visit: Payer: Self-pay

## 2014-10-20 ENCOUNTER — Telehealth: Payer: Self-pay | Admitting: Hematology and Oncology

## 2014-10-20 ENCOUNTER — Other Ambulatory Visit: Payer: Self-pay | Admitting: Hematology and Oncology

## 2014-10-20 ENCOUNTER — Ambulatory Visit: Payer: Medicaid Other

## 2014-10-20 ENCOUNTER — Encounter: Payer: Self-pay | Admitting: *Deleted

## 2014-10-20 DIAGNOSIS — I2510 Atherosclerotic heart disease of native coronary artery without angina pectoris: Secondary | ICD-10-CM

## 2014-10-20 DIAGNOSIS — Z510 Encounter for antineoplastic radiation therapy: Secondary | ICD-10-CM | POA: Diagnosis not present

## 2014-10-20 DIAGNOSIS — I255 Ischemic cardiomyopathy: Secondary | ICD-10-CM

## 2014-10-20 DIAGNOSIS — T17908D Unspecified foreign body in respiratory tract, part unspecified causing other injury, subsequent encounter: Secondary | ICD-10-CM

## 2014-10-20 LAB — CBC
HEMATOCRIT: 28 % — AB (ref 39.0–52.0)
Hemoglobin: 9.1 g/dL — ABNORMAL LOW (ref 13.0–17.0)
MCH: 29.6 pg (ref 26.0–34.0)
MCHC: 32.5 g/dL (ref 30.0–36.0)
MCV: 91.2 fL (ref 78.0–100.0)
Platelets: 436 10*3/uL — ABNORMAL HIGH (ref 150–400)
RBC: 3.07 MIL/uL — AB (ref 4.22–5.81)
RDW: 16.3 % — ABNORMAL HIGH (ref 11.5–15.5)
WBC: 5.9 10*3/uL (ref 4.0–10.5)

## 2014-10-20 LAB — GLUCOSE, CAPILLARY
GLUCOSE-CAPILLARY: 101 mg/dL — AB (ref 65–99)
GLUCOSE-CAPILLARY: 114 mg/dL — AB (ref 65–99)
Glucose-Capillary: 120 mg/dL — ABNORMAL HIGH (ref 65–99)
Glucose-Capillary: 113 mg/dL — ABNORMAL HIGH (ref 65–99)
Glucose-Capillary: 92 mg/dL (ref 65–99)

## 2014-10-20 LAB — RENAL FUNCTION PANEL
ALBUMIN: 2.6 g/dL — AB (ref 3.5–5.0)
ANION GAP: 5 (ref 5–15)
BUN: 11 mg/dL (ref 6–20)
CALCIUM: 8.4 mg/dL — AB (ref 8.9–10.3)
CO2: 28 mmol/L (ref 22–32)
Chloride: 103 mmol/L (ref 101–111)
Creatinine, Ser: 0.58 mg/dL — ABNORMAL LOW (ref 0.61–1.24)
GFR calc non Af Amer: >60 mL/min (ref >60)
GLUCOSE: 120 mg/dL — AB (ref 65–99)
PHOSPHORUS: 3.2 mg/dL (ref 2.5–4.6)
POTASSIUM: 4 mmol/L (ref 3.5–5.1)
SODIUM: 136 mmol/L (ref 135–145)

## 2014-10-20 LAB — MAGNESIUM: Magnesium: 2.1 mg/dL (ref 1.7–2.4)

## 2014-10-20 MED ORDER — PROCHLORPERAZINE MALEATE 10 MG PO TABS: 10.0000 mg | ORAL_TABLET | Freq: Four times a day (QID) | ORAL | Status: DC | PRN

## 2014-10-20 MED ORDER — SENNOSIDES 8.8 MG/5ML PO SYRP: 10.0000 mL | ORAL_SOLUTION | Freq: Two times a day (BID) | ORAL | Status: DC

## 2014-10-20 MED ORDER — SILVER SULFADIAZINE 1 % EX CREA: TOPICAL_CREAM | Freq: Every day | CUTANEOUS | Status: DC

## 2014-10-20 MED ORDER — OSMOLITE 1.5 CAL PO LIQD
237.0000 mL | ORAL | Status: DC
  Administered 2014-10-20: 237 mL
  Filled 2014-10-20 (×4): qty 237

## 2014-10-20 MED ORDER — ASPIRIN 81 MG PO CHEW: 81.0000 mg | CHEWABLE_TABLET | Freq: Every day | ORAL | Status: DC

## 2014-10-20 MED ORDER — FENTANYL 100 MCG/HR TD PT72: 100.0000 ug | MEDICATED_PATCH | TRANSDERMAL | Status: DC

## 2014-10-20 MED ORDER — BUPROPION HCL 100 MG PO TABS: 150.0000 mg | ORAL_TABLET | Freq: Two times a day (BID) | ORAL | Status: DC

## 2014-10-20 MED ORDER — OSMOLITE 1.5 CAL PO LIQD: 237.0000 mL | ORAL | Status: DC

## 2014-10-20 MED ORDER — CARVEDILOL 12.5 MG PO TABS: 6.2500 mg | ORAL_TABLET | Freq: Two times a day (BID) | ORAL | Status: DC

## 2014-10-20 MED ORDER — FREE WATER: 240.0000 mL | Freq: Four times a day (QID) | Status: DC

## 2014-10-20 MED ORDER — SCOPOLAMINE 1 MG/3DAYS TD PT72: 1.0000 | MEDICATED_PATCH | TRANSDERMAL | Status: DC

## 2014-10-20 MED ORDER — HEPARIN SOD (PORK) LOCK FLUSH 100 UNIT/ML IV SOLN
500.0000 [IU] | INTRAVENOUS | Status: AC | PRN
  Administered 2014-10-20: 500 [IU]
  Filled 2014-10-20: qty 5

## 2014-10-20 MED ORDER — ALPRAZOLAM 0.5 MG PO TABS: 0.5000 mg | ORAL_TABLET | Freq: Three times a day (TID) | ORAL | Status: DC | PRN

## 2014-10-20 MED ORDER — PANTOPRAZOLE SODIUM 40 MG PO PACK: 40.0000 mg | PACK | Freq: Every day | ORAL | Status: DC

## 2014-10-20 NOTE — Progress Notes (Signed)
  Oncology Nurse Navigator Documentation     Patient Visit Type: Inpatient (10/19/14 1030)   Barriers/Navigation Needs: Education (10/19/14 1030)   Interventions: Education Method (10/19/14 1030)     Education Method: Teach-back (10/19/14 1030)       Met with patient [facility] to check on wellbeing, provide support and encouragement, provide additional PEG instruction. I addressed his concerns re timing of DC, swallow evaluation ordered by Dr. Pablo Ledger, changing of ostomy bag; spoke with his RN Jenny Reichmann about same, later spoke with Dr. Sheran Fava re DC tomorrow s/p afternoon RT which she supported. Using Teach-back, educated [name] on use of PEG tube, he provided accurate return demonstration.  He understands I will meet with him and his m-in-law Vivien Rota tomorrow to reinforce education.  [name], RN, BSN, [place] [facility] [place] [phone]    Time Spent with Patient: 45 (10/19/14 1030)

## 2014-10-20 NOTE — Telephone Encounter (Signed)
lvm for pt regarding to OCT appt.....mailed pt appt sched and aletter

## 2014-10-20 NOTE — Progress Notes (Signed)
Pt to DC home with bolus feeds today after radiation. Due to pt not having insurance bolus feeds are much more cost effective.  [facility] to send RN to pt home tonight for teaching with bolus TF. Staff RN on floor to do teaching today prior to DC. No other CM needs communicated. Marney Doctor RN,BSN,NCM [phone]

## 2014-10-20 NOTE — Discharge Summary (Signed)
Physician Discharge Summary  [name] HYW:[id number] DOB: December 22, 1955 DOA: 10/11/2014  PCP: Imelda Pillow, NP  Admit date: 10/11/2014 Discharge date: 10/20/2014  Recommendations for Outpatient Follow-up:  1. Outpatient speech therapy assessment after completion of XRT treatments 2. Follow up with medical oncology Dr. Alvy Bimler in 1-2 weeks 3. Dr. Raquel James per schedule given to patient  Discharge Diagnoses:  Principal Problem:   Fever in adult Active Problems:   CAD-hx of CABG '06, DES LCX 01/2013, DES SVG-LAD 5/21/5 at [facility]   Ischemic cardiomyopathy, EF 45%   Fever, possible aspiration-treated with antibiotics   Squamous cell carcinoma of larynx (HCC)   Cancer associated pain   Dysphagia   Laryngeal cancer ([place])   Protein-calorie malnutrition, severe ([place])   Palliative care encounter   DNR (do not resuscitate) discussion   Pain in throat   Increased oropharyngeal secretions   Aspiration into respiratory tract   Discharge Condition: stable, improved  Diet recommendation:  1 can (240ml) of Osmolite 1.5, 6 cans daily. (0600, 0900, 1200, 1500, 1800, 2100) Free water flushes of 240 ml four times daily  Tube feeding regimen provides 2130 kcal (100% of needs), 90g protein, and 2046 ml free water.  Wt Readings from Last 3 Encounters:  10/19/14 67.586 kg (149 lb)  10/10/14 66.225 kg (146 lb)  10/10/14 66.633 kg (146 lb 14.4 oz)    History of present illness:   [name] is a 59 y.o. male with laryngeal cancer, recent colostomy d/t perforated diverticulitis, CAD s/p CABG, NSTEMI subsequent to colostomy, chronic systolic HF, and hx of EtOH abuse. Has been receiving radiation to his neck, nearly finished with radiation course. He was intolerant to chemotherapy (anaphylaxis) so that was discontinued.  Of late, the patient has been having increasing dysphagia progressing from solids now to liquids as well. He does complain of moderate throat pain, especially when  talking or swallowing. Two days prior to presentation to ED, he began having fevers up to 101 and a productive cough, was given augmentin by oncologist.  Patient unable to swallow pills or tolerate PO intake so was admitted to the hospital.   Upon admission, chest x-ray revealed R sided infiltrates, possibly d/t aspiration given severe dysphagia. He also had severe scabbing and ulceration of his neck secondary to radiation therapy with purulent discharge coming from wound bed.  Hospital Course:   Probable Aspiration Pneumonia:  He was initially treated with levaquin, flagyl, and vancomycin, then transitioned to Augmentin per tube on 10/14/2014.  He remained nontoxic andafebrile.  CXR done on 10/14/2014 remained stable and he completed 7-days of antibiotics in hospital.  Blood cultures remained negative.    Acute Radiation Dermatitis with severe scabbing and ulceration of the neck secondary to this.  Purlulent discharge coming from wound grew MSSA and he completed 7 days of antibiotics.  Wound care recommended twice daily and PRN cleansing of the tissue with NS and gently patting dry to soften and eventually remove the dried scab.  Wound exudate treated with an antimicrobial textile (Dermatherapy).  Continue pain management with fentanyl patch and roxanol as needed. Would is healing well, improving daily.   Dysphagia: He presented with dysphagia due to laryngeal cancer and radiation.  NG tube was placed on 10/12/2014 and he had a PEG tube placed 10/4.  He will do bolus tube feeds at home as above.  He should have a repeat swallow evaluation once he completes radiation therapy per his preference.  Dr. Pablo Ledger from Radiology Oncology stated that  she felt he could undergo repeat swallow evaluation at any time.    Squamous Cell Carcinoma of Larynx: Radiation oncology continued treatments during hospitalization and he received a double dose on 10/7.  Pain was controlled with morphine and fentanyl patch which  were prescribed at discharge.    Hypokalemia: Resolved. Likely secondary to severe dysphagia and minimal PO intake the past week. Potassium improving to 3.9 after replacement.   Hypomagnesemia. Resolved with repletion  Hypophosphatemia. His phosphorus improved to 2.9 after replacement  Severe Protein Calorie Malnutrition: Evidenced by having wt of 68.1 Kg.  Started on tube feeds and will potentially be able to supplement with oral intake also after repeat swallow evaluation.    Anxiety:  May resume xanax through PEG tube.  Converted Wellbutrin XL to Marianita Botkin acting but crushable bupropion in equivalent dose.  Anemia of Chronic Disease: Continue close monitoring of CBC, Hg remains stable since admission. No indication for transfusion at this time.   CAD s/p PCI and chronic systolic heart failure with EF 45%.  Continued beta blocker and resumed aspirin post procedure.  Asked him to discuss statin medication with cardiologist.  Not on ACEI due to hypotension.  Consultants:  Interventional radiology  Oncology  Pulmonology  Procedures:  NG Tube Placement 10/12/2014  Antibiotics:  Levaquin 10/11/2014 >> 10/14/2014  Flagyl 10/11/2014 >> 10/14/2014  Vancomycin 10/11/2014 >>10/14/2014  Augmentin 10/14/2014 >> 10/5  Discharge Exam: Filed Vitals:   10/20/14 1009  BP: 120/64  Pulse: 79  Temp:   Resp:    Filed Vitals:   10/19/14 1345 10/19/14 1430 10/19/14 2037 10/20/14 1009  BP: 128/58  102/53 120/64  Pulse: 86  84 79  Temp: 98.7 F (37.1 C)  97.3 F (36.3 C)   TempSrc: Oral  Oral   Resp: 18  20   Height:      Weight:  67.586 kg (149 lb)    SpO2: 99%  97% 94%     General: Adult male, No acute distress, hoarse voice and erythema of the anterior neck and upper chest  HEENT: NCAT, MMM  Cardiovascular: RRR, no mrg  Respiratory: CTAB, no increased WOB  Abdomen: NABS, soft, NT/ND. PEG tube without surrounding erythema or induraction on the left abdomen. Ostomy bag  full of soft liquid brown-yellow stool  MSK: Normal tone and bulk, no LEE  Neuro: Grossly intact  Discharge Instructions      Discharge Instructions    (HEART FAILURE PATIENTS) Call MD:  Anytime you have any of the following symptoms: 1) 3 pound weight gain in 24 hours or 5 pounds in 1 week 2) shortness of breath, with or without a dry hacking cough 3) swelling in the hands, feet or stomach 4) if you have to sleep on extra pillows at night in order to breathe.    Complete by:  As directed      Call MD for:  difficulty breathing, headache or visual disturbances    Complete by:  As directed      Call MD for:  extreme fatigue    Complete by:  As directed      Call MD for:  hives    Complete by:  As directed      Call MD for:  persistant dizziness or light-headedness    Complete by:  As directed      Call MD for:  persistant nausea and vomiting    Complete by:  As directed      Call MD for:  redness, tenderness,  or signs of infection (pain, swelling, redness, odor or green/yellow discharge around incision site)    Complete by:  As directed      Call MD for:  severe uncontrolled pain    Complete by:  As directed      Call MD for:  temperature >100.4    Complete by:  As directed      Discharge instructions    Complete by:  As directed   Please take 6 cans of osmolite every day and about 262mL of water four times per day every day.  I have tried to convert most of your medications to the types that go through your PEG tube.  You may continue to take your morphine under your tongue.  The scopolamine patch will help with nausea but can worsen diarrhea.  Xanax, fentanyl, morphine, scopolamine, and compazine are all sedating medications and drastically increase your risk of accidental or unintentional overdose.  Please use these medications sparingly and never take more than what is prescribed.     Increase activity slowly    Complete by:  As directed             Medication List     STOP taking these medications        amoxicillin-clavulanate 875-125 MG tablet  Commonly known as:  AUGMENTIN     aspirin EC 81 MG tablet  Replaced by:  aspirin 81 MG chewable tablet     buPROPion 300 MG 24 hr tablet  Commonly known as:  WELLBUTRIN XL  Replaced by:  buPROPion 100 MG tablet     pantoprazole 40 MG tablet  Commonly known as:  PROTONIX  Replaced by:  pantoprazole sodium 40 mg/20 mL Pack     potassium chloride SA 20 MEQ tablet  Commonly known as:  K-DUR,KLOR-CON      TAKE these medications        acetaminophen 500 MG tablet  Commonly known as:  TYLENOL  Take 1,000 mg by mouth every 6 (six) hours as needed for mild pain, moderate pain or headache.     albuterol 108 (90 BASE) MCG/ACT inhaler  Commonly known as:  PROVENTIL HFA;VENTOLIN HFA  Inhale 2 puffs into the lungs every 6 (six) hours as needed for wheezing or shortness of breath.     ALPRAZolam 0.5 MG tablet  Commonly known as:  XANAX  Place 1 tablet (0.5 mg total) into feeding tube 3 (three) times daily as needed for anxiety.     aspirin 81 MG chewable tablet  Commonly known as:  ASPIRIN CHILDRENS  Place 1 tablet (81 mg total) into feeding tube daily.     buPROPion 100 MG tablet  Commonly known as:  WELLBUTRIN  Place 1.5 tablets (150 mg total) into feeding tube 2 (two) times daily.     carvedilol 12.5 MG tablet  Commonly known as:  COREG  Place 0.5 tablets (6.25 mg total) into feeding tube 2 (two) times daily.     feeding supplement (OSMOLITE 1.5 CAL) Liqd  Place 237 mLs into feeding tube every 3 (three) hours while awake.     fentaNYL 100 MCG/HR  Commonly known as:  DURAGESIC - dosed mcg/hr  Place 1 patch (100 mcg total) onto the skin every 3 (three) days.     free water Soln  Place 240 mLs into feeding tube every 6 (six) hours.     lidocaine-prilocaine cream  Commonly known as:  EMLA  Apply 1 application topically as directed. Apply to affected  area once     morphine CONCENTRATE 10 mg /  0.5 ml concentrated solution  Take 1 mL (20 mg total) by mouth every 2 (two) hours as needed for severe pain.     pantoprazole sodium 40 mg/20 mL Pack  Commonly known as:  PROTONIX  Place 20 mLs (40 mg total) into feeding tube daily.     prochlorperazine 10 MG tablet  Commonly known as:  COMPAZINE  Place 1 tablet (10 mg total) into feeding tube every 6 (six) hours as needed for nausea or vomiting.     scopolamine 1 MG/3DAYS  Commonly known as:  TRANSDERM-SCOP  Place 1 patch (1.5 mg total) onto the skin every 3 (three) days.     sennosides 8.8 MG/5ML syrup  Commonly known as:  SENOKOT  Place 10 mLs into feeding tube 2 (two) times daily.     silver sulfADIAZINE 1 % cream  Commonly known as:  SILVADENE  Apply topically daily.       Follow-up Information    Follow up with Millsaps, Luane School, NP.   Why:  As needed   Contact information:   [facility] [place] [address] [phone]       Follow up with [name], MD.   Specialty:  Radiation Oncology   Contact information:   [place] [address] [phone]       Follow up with [facility], NI, MD In 2 weeks.   Specialty:  Hematology and Oncology   Contact information:   [place] [phone] [phone]        The results of significant diagnostics from this hospitalization (including imaging, microbiology, ancillary and laboratory) are listed below for reference.    Significant Diagnostic Studies: Dg Chest 2 View  10/14/2014   CLINICAL DATA:  Aspiration.  EXAM: CHEST  2 VIEW  COMPARISON:  Radiograph 10/11/2014  FINDINGS: Sternotomy wires overlie normal cardiac silhouette. Endotracheal tube extends the stomach. RIGHT port in place. Lungs are hyperinflated. Small bilateral pleural effusions.  IMPRESSION: 1. Support apparatus appears in good position. 2. Small bilateral pleural effusions.   Electronically Signed   By: Suzy Bouchard  M.D.   On: 10/14/2014 09:51   Dg Chest 2 View  10/11/2014   CLINICAL DATA:  Cough. Laryngeal cancer. Recent perforated diverticulitis. Recent fever. Dysphagia.  EXAM: CHEST  2 VIEW  COMPARISON:  10/09/2014 chest radiograph.  FINDINGS: Right internal jugular Port-A-Cath terminates at the cavoatrial junction. Median sternotomy wires are stable in configuration and appear intact. Stable cardiomediastinal silhouette with normal heart size. No pneumothorax. No pleural effusion. No pulmonary edema. There is new patchy opacity in the basilar right middle lobe and right lower lobe. There are healing subacute fractures of the anterior left fifth and sixth ribs.  IMPRESSION: 1. New patchy lung opacity in the basilar right middle lobe and basilar right lower lobe, suspicious for pneumonia. Recommend follow-up post treatment PA and lateral chest radiographs in 6-8 weeks. 2. Healing nondisplaced anterior left 5th and 6th rib fractures.   Electronically Signed   By: Ilona Sorrel M.D.   On: 10/11/2014 17:43   Dg Chest 2 View  10/09/2014   CLINICAL DATA:  Productive cough.  EXAM: CHEST  2 VIEW  COMPARISON:  August 06, 2014.  FINDINGS: The heart size and mediastinal contours are within normal limits. Both lungs are clear. Sternotomy wires are noted. No pneumothorax or pleural effusion is noted. Right internal jugular  Port-A-Cath is noted with distal tip overlying expected position of the SVC. The visualized skeletal structures are unremarkable.  IMPRESSION: No active cardiopulmonary disease.   Electronically Signed   By: Marijo Conception, M.D.   On: 10/09/2014 13:18   Ir Gastrostomy Tube Mod Sed  10/17/2014   INDICATION: History of laryngeal cancer, now with dysphagia. Please perform percutaneous gastrostomy tube placement for enteric nutrition supplementation  EXAM: PUSH GASTROSTOMY TUBE PLACEMENT  COMPARISON:  CT abdomen pelvis - 08/22/2014  MEDICATIONS: Ancef 2 g IV.  Antibiotics were administered within 1 hour of the  procedure.  CONTRAST:  20 mL of Isovue 300 administered into the gastric lumen.  ANESTHESIA/SEDATION: Versed 2 mg IV; Fentanyl 50 mcg IV.  Sedation time  16 minutes  FLUOROSCOPY TIME:  5 minutes 12 seconds (89 mGy)  COMPLICATIONS: None immediate  PROCEDURE: Informed written consent was obtained from the patient following explanation of the procedure, risks, benefits and alternatives. A time out was performed prior to the initiation of the procedure. Ultrasound scanning was performed to demarcate the edge of the left lobe of the liver. Maximal barrier sterile technique utilized including caps, mask, sterile gowns, sterile gloves, large sterile drape, hand hygiene and Betadine prep.  The left upper quadrant was sterilely prepped and draped. Existing nasogastric feeding tube was utilized for gastric insufflation. The left costal margin and air opacified transverse colon were identified and avoided. Air was injected into the stomach for insufflation and visualization under fluoroscopy. Under sterile conditions and local anesthesia, 3 T tacks were utilized to pexy the anterior aspect of the stomach against the ventral abdominal wall. Contrast injection confirmed appropriate positioning of each of the T tacks. An incision was made between the T tacks and a 17 gauge trocar needle was utilized to access the stomach. Needle position was confirmed within the stomach with aspiration of air and injection of a small amount of contrast. A stiff Glidewire was advanced into the gastric lumen and under intermittent fluoroscopic guidance, the access needle was exchanged for a Kumpe catheter. With the use of the Kumpe catheter, a stiff Glidewire was advanced into the horizontal segment of the duodenum. Under intermittent fluoroscopic guidance, the Kumpe catheter was exchanged for a telescoping peel-away sheath, ultimately allowing placement of a 20-French balloon retention gastrostomy tube. The retention balloon was insufflated with a  mixture of dilute saline and contrast and pulled taut against the anterior wall of the stomach. The external disc was cinched. Contrast injection confirms positioning within the stomach. Several spot radiographic images were obtained in various obliquities for documentation. The patient tolerated procedure well without immediate post procedural complication.  FINDINGS: After successful fluoroscopic guided placement, the gastrostomy tube is appropriately positioned with internal retention balloon against the ventral aspect of the gastric lumen.  IMPRESSION: Successful fluoroscopic insertion of an 9 French balloon retention gastrostomy tube.  The gastrostomy may be used immediately for medication administration and in 24 hrs for the initiation of feeds.   Electronically Signed   By: Sandi Mariscal M.D.   On: 10/17/2014 16:14   Dg Loyce Dys Tube Plc W/fl W/rad  10/12/2014   CLINICAL DATA:  Laryngeal cancer. Unsuccessful nasogastric tube attempts x2 on the inpatient floor. Requested fluoroscopy guided-nasogastric tube placement .  EXAM: NASO G TUBE PLACEMENT WITH FL AND WITH RAD  CONTRAST:  None.  FLUOROSCOPY TIME:  Fluoroscopy Time (in minutes and seconds): 1 minutes 30 seconds  Number of Acquired Images: 0 fluoroscopy exposure images. (1 image capture acquired)  COMPARISON:  10/11/2014 chest radiograph and 08/04/2014 chest CT.  FINDINGS: Under real-time fluoroscopic guidance, a 86 French nasogastric tube was placed through the left nostril into the proximal stomach, as verified by fluoroscopy. The nasogastric tube was externally secured in place by the technologist. The procedure was well tolerated, with no immediate complication.  IMPRESSION: Successful fluoroscopically guided nasogastric tube placement. The nasogastric tube is ready for use on the inpatient floor.   Electronically Signed   By: Ilona Sorrel M.D.   On: 10/12/2014 14:55    Microbiology: Recent Results (from the past 240 hour(s))  Culture, blood  (routine x 2)     Status: None   Collection Time: 10/11/14  4:22 PM  Result Value Ref Range Status   Specimen Description BLOOD LEFT HAND  Final   Special Requests BOTTLES DRAWN AEROBIC AND ANAEROBIC 5 CC EA  Final   Culture   Final    NO GROWTH 5 DAYS Performed at [facility]    Report Status 10/16/2014 FINAL  Final  Culture, blood (routine x 2)     Status: None   Collection Time: 10/11/14  4:25 PM  Result Value Ref Range Status   Specimen Description BLOOD RIGHT HAND  Final   Special Requests BOTTLES DRAWN AEROBIC AND ANAEROBIC 5 CC EA  Final   Culture   Final    NO GROWTH 5 DAYS Performed at [facility]    Report Status 10/16/2014 FINAL  Final  Wound culture     Status: None   Collection Time: 10/12/14 11:59 AM  Result Value Ref Range Status   Specimen Description WOUND NECK  Final   Special Requests NONE  Final   Gram Stain   Final    RARE WBC PRESENT, PREDOMINANTLY PMN NO SQUAMOUS EPITHELIAL CELLS SEEN RARE GRAM POSITIVE COCCI IN PAIRS Performed at [facility]    Culture   Final    MODERATE STAPHYLOCOCCUS AUREUS Note: RIFAMPIN AND GENTAMICIN SHOULD NOT BE USED AS SINGLE DRUGS FOR TREATMENT OF STAPH INFECTIONS. Performed at [facility]    Report Status 10/15/2014 FINAL  Final   Organism ID, Bacteria STAPHYLOCOCCUS AUREUS  Final      Susceptibility   Staphylococcus aureus - MIC*    CLINDAMYCIN <=0.25 SENSITIVE Sensitive     ERYTHROMYCIN <=0.25 SENSITIVE Sensitive     GENTAMICIN <=0.5 SENSITIVE Sensitive     LEVOFLOXACIN <=0.12 SENSITIVE Sensitive     OXACILLIN <=0.25 SENSITIVE Sensitive     RIFAMPIN <=0.5 SENSITIVE Sensitive     TRIMETH/SULFA <=10 SENSITIVE Sensitive     VANCOMYCIN <=0.5 SENSITIVE Sensitive     TETRACYCLINE <=1 SENSITIVE Sensitive     MOXIFLOXACIN <=0.25 SENSITIVE Sensitive     * MODERATE STAPHYLOCOCCUS AUREUS     Labs: Basic Metabolic Panel:  Recent Labs Lab 10/14/14 0508 10/15/14 0430 10/15/14 0750  10/16/14 0605 10/17/14 0515 10/18/14 0535 10/20/14 0540  NA 139  --  136 138 138  --  136  K 3.2*  --  3.7 3.9 4.0  --  4.0  CL 105  --  107 103 103  --  103  CO2 27  --  28 29 28   --  28  GLUCOSE 144*  --  120* 113* 109*  --  120*  BUN <5*  --  <5* <5* 6  --  11  CREATININE 0.62  --  0.58* 0.52* 0.62 0.62 0.58*  CALCIUM 7.8*  --  7.9* 8.2* 8.5*  --  8.4*  MG 1.8  --   --  1.9  --   --  2.1  PHOS 2.5 2.9  --   --  3.8  --  3.2   Liver Function Tests:  Recent Labs Lab 10/20/14 0540  ALBUMIN 2.6*   No results for input(s): LIPASE, AMYLASE in the last 168 hours. No results for input(s): AMMONIA in the last 168 hours. CBC:  Recent Labs Lab 10/16/14 0605 10/17/14 0515 10/20/14 0540  WBC 6.1 6.2 5.9  HGB 9.1* 9.4* 9.1*  HCT 28.7* 29.1* 28.0*  MCV 89.7 90.1 91.2  PLT 385 408* 436*   Cardiac Enzymes: No results for input(s): CKTOTAL, CKMB, CKMBINDEX, TROPONINI in the last 168 hours. BNP: BNP (last 3 results)  Recent Labs  08/05/14 0115  BNP 780.2*    ProBNP (last 3 results) No results for input(s): PROBNP in the last 8760 hours.  CBG:  Recent Labs Lab 10/19/14 2035 10/20/14 0009 10/20/14 0427 10/20/14 0728 10/20/14 1136  GLUCAP 112* 114* 101* 120* 113*    Time coordinating discharge: 35 minutes  Signed:  Ashelyn Mccravy  [facility] 10/20/2014, 12:56 PM

## 2014-10-20 NOTE — Progress Notes (Signed)
[facility] Therapy Treatment Record Phone [phone]   Radiation Therapy was administered to [name] on: 10/20/2014  3:50 PM and was treatment # 34 out of a planned course of 35 treatments.

## 2014-10-20 NOTE — Progress Notes (Signed)
NUTRITION NOTE  Pt seen for quick follow-up. RD saw pt for full follow-up yesterday and provided all information at that time concerning home TF regimen. Asked pt and visitor at bedside if they had any questions or concerns concerning home TF. They report that pt is to go to radiation around 3 PM today and that TF to be transitioned to bolus, from continuous, prior to d/c.   Home TF regimen will be: 6 cans Osmolite 1.5 with 240 mL free water QID. This will provide 2130 kcal, 90 grams protein, and 2046 mL free water  Estimated nutrition needs:  2100-2350 kcal 90-105 grams protein >/= 2.2 L/day fluid   Advised pt and visitor to have RN contact RD should they have questions prior to d/c.    Jarome Matin, RD, LDN Inpatient Clinical Dietitian Pager # [phone] After hours/weekend pager # [phone]

## 2014-10-20 NOTE — Progress Notes (Signed)
10/20/14  1700 Reviewed discharge instructions with patient. Patient verbalized understanding of discharge instructions. Copy of prescriptions and discharge instructions given to patient.

## 2014-10-20 NOTE — Progress Notes (Signed)
[facility] Therapy Treatment Record Phone [phone]   Radiation Therapy was administered to [name] on: 10/20/2014 @933am   and was treatment # 33 out of a planned course of 35 treatments.

## 2014-10-20 NOTE — Progress Notes (Signed)
[name]   DOB:10-26-1955   ZO#:[id number]   I have seen the patient, examined him and edited the notes as follows  Subjective:  Events since 10/5 noted.  He reports feeling better this morning. He is afebrile. Denies any cough. He had persistent neck pain, stable. He denies any shortness of breath or chest pain. He denies any leg swelling. He denies any abdominal pain, nausea or vomiting. He is tolerating tube feeds well. Denies any bleeding issues such as epistaxis, hemoptysis, hematuria or hematochezia. Wants to increase ambulation. No confusion is reported.   Scheduled Meds: . antiseptic oral rinse  7 mL Mouth Rinse BID  . enoxaparin (LOVENOX) injection  40 mg Subcutaneous Q24H  . fentaNYL  100 mcg Transdermal Q72H  . free water  100 mL Per Tube 4 times per day  . LORazepam  0.25 mg Intravenous TID  . metoprolol  2.5 mg Intravenous Q12H  . pantoprazole (PROTONIX) IV  40 mg Intravenous Q24H  . scopolamine  1 patch Transdermal Q72H  . sennosides  10 mL Oral BID  . silver sulfADIAZINE   Topical Daily   Continuous Infusions: . sodium chloride 75 mL/hr at 10/19/14 1818  . feeding supplement (OSMOLITE 1.5 CAL) 1,000 mL (10/20/14 0108)   PRN Meds:.[DISCONTINUED] acetaminophen **OR** acetaminophen, albuterol, morphine CONCENTRATE, ondansetron **OR** ondansetron (ZOFRAN) IV  Objective:  Filed Vitals:   10/19/14 2037  BP: 102/53  Pulse: 84  Temp: 97.3 F (36.3 C)  Resp: 20     Intake/Output Summary (Last 24 hours) at 10/20/14 0834 Last data filed at 10/20/14 0800  Gross per 24 hour  Intake      0 ml  Output   1425 ml  Net  -1425 ml    GENERAL:alert, no distress and comfortable SKIN: Persistent skin rash around his neck, stable. EYES: normal, Conjunctiva are pink and non-injected, sclera clear OROPHARYNX:no exudate, no erythema and lips, buccal mucosa, and tongue normal . Joint mucous membrane is noted ABDOMEN:abdomen soft, non-tender and normal bowel sounds. Feeding tube  site looks normal. Colostomy site looks normal Musculoskeletal: no cyanosis of digits and no clubbing  NEURO: alert & oriented x 3 with fluent speech, no focal motor/sensory deficits   Labs:  Lab Results  Component Value Date   WBC 5.9 10/20/2014   HGB 9.1* 10/20/2014   HCT 28.0* 10/20/2014   MCV 91.2 10/20/2014   PLT 436* 10/20/2014   NEUTROABS 3.4 09/25/2014    Lab Results  Component Value Date   NA 136 10/20/2014   K 4.0 10/20/2014   CL 103 10/20/2014   CO2 28 10/20/2014    Studies:  No results found.  Assessment & Plan:   Squamous cell carcinoma of larynx Will defer to Dr. Pablo Ledger to manage his radiation treatment while hospitalized Continue supportive care.  Infection, respiratory tract, possible RLL pneumonia secondary to aspiration The patient had unresolved upper respiratory tract infection and possible skin infection on admission, This improved with IV antibiotics then transitioned to antibiotics per G tube. He has completed antibiotic course  Skin ulceration He has significant skin ulceration, improving with wound care Continue present care  Protein calorie malnutrition The patient is not able to tolerate any form of oral intake. Speech therapist felt that he probably would not be able to swallow. He has placement of feeding tube. He is tolerating boluses tube feedings well  Uncontrollable mucositis and neck pain, improved He is on fentanyl patch and intermittent doses of liquid morphine. He would need aggressive  pain management Appreciate involvement of palliative care service for assistance in pain management and other symptomatic management.  Anemia of chronic disease He is not symptomatic. No need for blood transfusion  Intermittent confusion & delirium, resolved Recommend family members involvement to orient the patient and reduce the risk of delirium and falls  Recent non-STEMI Clinically, he is not symptomatic. There is no signs and  symptoms of congestive heart failure. Continue medical management  DVT prophylaxis On Lovenox  Full Code  Discharge planning Likely to home today with home health nursing, as per primary team's discretion We will sign off. I have made him an appointment to see me on 11/06/2014. Rondel Jumbo, PA-C 10/20/2014  8:34 AM  Mariamawit Depaoli, MD 10/20/2014

## 2014-10-20 NOTE — Progress Notes (Signed)
PT Cancellation Note  Patient Details Name: [name] MRN: [id number] DOB: 13-Dec-1955   Cancelled Treatment:     attempted to see twice today however unable.  Has 2 Radiation Tx's today and resting between.    Nathanial Rancher 10/20/2014, 2:23 PM

## 2014-10-21 ENCOUNTER — Ambulatory Visit: Payer: Self-pay

## 2014-10-23 ENCOUNTER — Encounter: Payer: Self-pay | Admitting: *Deleted

## 2014-10-23 ENCOUNTER — Ambulatory Visit
Admission: RE | Admit: 2014-10-23 | Discharge: 2014-10-23 | Disposition: A | Discharge status: Home / Self Care | Payer: Medicaid Other | Source: Ambulatory Visit | Attending: Radiation Oncology | Admitting: Radiation Oncology

## 2014-10-23 ENCOUNTER — Encounter: Payer: Self-pay | Admitting: Radiation Oncology

## 2014-10-23 ENCOUNTER — Ambulatory Visit: Payer: Medicaid Other

## 2014-10-23 DIAGNOSIS — Z510 Encounter for antineoplastic radiation therapy: Secondary | ICD-10-CM | POA: Diagnosis not present

## 2014-10-23 DIAGNOSIS — C329 Malignant neoplasm of larynx, unspecified: Secondary | ICD-10-CM | POA: Diagnosis not present

## 2014-10-23 NOTE — Progress Notes (Signed)
  Oncology Nurse Navigator Documentation     Patient Visit Type: Inpatient (10/20/14 1330)   Barriers/Navigation Needs: Education (10/20/14 1330)   Interventions: Education Method (10/20/14 1330)     Education Method: Teach-back (10/20/14 1330)      Met with patient [facility] 1322 to provide additional PEG feeding/care instruction, used PEG teaching device.  Richardson Landry preferred I discuss with Vivien Rota, his m-in-law. I reinforced previous instruction re bolus feedings including free-water flushes.  She provided accurate return demonstration with, verbalization of the importance of flushes.  We discussed suggested schedule/amounts for feedings to meet 6 can/day goal. I discussed PEG care, including showering, demonstrated site cleaning and dsg change, options for securing tube (tape, mesh brief).  She verbalized understanding. I explained supplies delivered by [facility]. I later provided additional care supplies:  Small bottle of saline, applicators, 2x2 gauze, pair of mesh briefs.  They understand I will be available on Monday for additional guidance as needed.  [name], RN, BSN, [place] at [place] [phone]    Time Spent with Patient: 30 (10/20/14 1330)

## 2014-10-23 NOTE — Progress Notes (Signed)
Patient presented to the clinic following final treatment. Patient without complaints. Patient understands to continue current skin care regimen for the next two weeks and call with any worsening symptoms. One month follow up appointment card given.

## 2014-10-24 ENCOUNTER — Ambulatory Visit: Payer: Self-pay

## 2014-10-24 NOTE — Progress Notes (Signed)
  Oncology Nurse Navigator Documentation     Patient Visit Type: Radonc (10/23/14 1400) Treatment Phase: Final Radiation Tx (10/23/14 1400)     Met with pt during final RT to offer support and to celebrate end of radiation treatment.  He was accompanied by his m-in-law, Tonie. He reported success with bolus feedings since being DCed home last Friday. I provided Tonie with a Certificate of Recognition. I explained my role as navigator will continue for several more months and that I will be calling and/or joining him during follow-up visits.   I encouraged them to call me with needs/concerns; they verbalized understanding.  [name], RN, BSN, [place] at [place] [phone]                  Time Spent with Patient: 45 (10/23/14 1400)

## 2014-10-25 ENCOUNTER — Ambulatory Visit: Payer: Self-pay

## 2014-10-26 ENCOUNTER — Ambulatory Visit: Payer: Self-pay

## 2014-10-27 ENCOUNTER — Ambulatory Visit: Payer: Self-pay

## 2014-10-28 ENCOUNTER — Ambulatory Visit: Payer: Self-pay

## 2014-10-29 NOTE — Progress Notes (Signed)
  Radiation Oncology         [phone] ________________________________  Name: [name] MRN: [id number]  Date: 10/23/2014  DOB: 1955/09/06  End of Treatment Note  Diagnosis:   Squamous cell carcinoma of larynx (HCC)   Staging form: Larynx - Supraglottis, AJCC 7th Edition     Clinical stage from 07/20/2014: Stage III (T3, N0, M0) - Signed by [name], MD on 08/01/2014     Indication for treatment:  Curative       Radiation treatment dates:   09/05/2014-10/23/2014  Site/dose:   Larynx 70 Gy at 2 Gy per fractions for 35 fractions with at risk lymph nodes receiving 63 Gy at 1.8 Gy per fraction and 56 Gy at 1.6 Gy per fraction. He was treated 6 fractions per week with radiation alone.   Beams/energy:   IMRT with helical tomotherapy and 6 MV photons.    Narrative: The patient tolerated radiation treatment relatively well after beginning radiation.  He was re-planned during treatment due to weight loss. He also underwent several treatment breaks due to hospitalizations, malnutrition, dehydration and skin issues.  He was ultimately able to complete treatment in 48 days.    Plan: The patient has completed radiation treatment. The patient will return to radiation oncology clinic for routine followup in one month. I advised them to call or return sooner if they have any questions or concerns related to their recovery or treatment.  ------------------------------------------------  [name], MD

## 2014-10-30 ENCOUNTER — Telehealth: Payer: Self-pay | Admitting: *Deleted

## 2014-10-30 NOTE — Telephone Encounter (Signed)
  Oncology Nurse Navigator Documentation     Patient Visit Type: Follow-up (10/30/14 1416) Treatment Phase: Final Radiation Tx;Other (10/30/14 1416)     Called patient to check on wellbeing s/p final RT on 10/23/14 LVMM asking him to return my call.  [name], RN, BSN, [place] at [place] [phone]                 Time Spent with Patient: 15 (10/30/14 1416)

## 2014-10-31 ENCOUNTER — Other Ambulatory Visit: Payer: Self-pay | Admitting: *Deleted

## 2014-10-31 MED ORDER — METOCLOPRAMIDE HCL 5 MG/5ML PO SOLN: 10.0000 mg | Freq: Three times a day (TID) | ORAL | Status: DC

## 2014-11-02 ENCOUNTER — Telehealth: Payer: Self-pay | Admitting: *Deleted

## 2014-11-02 ENCOUNTER — Encounter: Payer: Self-pay | Admitting: Radiation Oncology

## 2014-11-02 NOTE — Telephone Encounter (Signed)
  Oncology Nurse Navigator Documentation     Patient Visit Type: Follow-up (11/02/14 1452) Treatment Phase: Final Radiation Tx (11/02/14 1452)     In absence of returned calls from patient, called his m-in-law to check on his well being. She reported:  Doing well.  Has been able to reduce PRN liquid morphine to twice daily.  Still using Fentanyl patch.  Instilling 4 cans nutritional supplement daily along with water.   She confirmed understanding of 12:30 lab and 1300 appt with Dr. Alvy Bimler next week.   [name], RN, BSN, [place] at [place] [phone]                 Time Spent with Patient: 15 (11/02/14 1452)

## 2014-11-02 NOTE — Telephone Encounter (Signed)
  Oncology Nurse Navigator Documentation     Patient Visit Type: Follow-up (11/02/14 1040) Treatment Phase: Final Radiation Tx (11/02/14 1040)     Called patient to check on well being since completing RT last week.  LVMM asking him to return my call.  [name], RN, BSN, [place] at [place] [phone]                 Time Spent with Patient: 15 (11/02/14 1040)

## 2014-11-06 ENCOUNTER — Ambulatory Visit (HOSPITAL_BASED_OUTPATIENT_CLINIC_OR_DEPARTMENT_OTHER): Payer: Medicaid Other | Admitting: Hematology and Oncology

## 2014-11-06 ENCOUNTER — Telehealth: Payer: Self-pay | Admitting: Hematology and Oncology

## 2014-11-06 ENCOUNTER — Other Ambulatory Visit: Payer: Self-pay | Admitting: Hematology and Oncology

## 2014-11-06 ENCOUNTER — Other Ambulatory Visit (HOSPITAL_BASED_OUTPATIENT_CLINIC_OR_DEPARTMENT_OTHER): Payer: Medicaid Other

## 2014-11-06 VITALS — BP 111/84 | HR 82 | Temp 98.0°F | Resp 16 | Ht 67.0 in | Wt 147.8 lb

## 2014-11-06 DIAGNOSIS — C329 Malignant neoplasm of larynx, unspecified: Secondary | ICD-10-CM

## 2014-11-06 DIAGNOSIS — E46 Unspecified protein-calorie malnutrition: Secondary | ICD-10-CM

## 2014-11-06 DIAGNOSIS — D638 Anemia in other chronic diseases classified elsewhere: Secondary | ICD-10-CM | POA: Diagnosis not present

## 2014-11-06 DIAGNOSIS — R1310 Dysphagia, unspecified: Secondary | ICD-10-CM

## 2014-11-06 DIAGNOSIS — R070 Pain in throat: Secondary | ICD-10-CM

## 2014-11-06 LAB — COMPREHENSIVE METABOLIC PANEL (CC13)
ALBUMIN: 3.3 g/dL — AB (ref 3.5–5.0)
ALT: 34 U/L (ref 0–55)
AST: 29 U/L (ref 5–34)
Alkaline Phosphatase: 138 U/L (ref 40–150)
Anion Gap: 7 mEq/L (ref 3–11)
BUN: 16.7 mg/dL (ref 7.0–26.0)
CALCIUM: 9.9 mg/dL (ref 8.4–10.4)
CHLORIDE: 103 meq/L (ref 98–109)
CO2: 29 meq/L (ref 22–29)
CREATININE: 0.9 mg/dL (ref 0.7–1.3)
EGFR: 87 mL/min/{1.73_m2} — ABNORMAL LOW (ref >90)
GLUCOSE: 122 mg/dL (ref 70–140)
POTASSIUM: 4.2 meq/L (ref 3.5–5.1)
Sodium: 139 mEq/L (ref 136–145)
TOTAL PROTEIN: 6.9 g/dL (ref 6.4–8.3)
Total Bilirubin: <0.3 mg/dL (ref 0.20–1.20)

## 2014-11-06 LAB — CBC WITH DIFFERENTIAL/PLATELET
BASO%: 0.4 % (ref 0.0–2.0)
Basophils Absolute: 0 10*3/uL (ref 0.0–0.1)
EOS ABS: 0.1 10*3/uL (ref 0.0–0.5)
EOS%: 1.5 % (ref 0.0–7.0)
HCT: 36 % — ABNORMAL LOW (ref 38.4–49.9)
HEMOGLOBIN: 11.6 g/dL — AB (ref 13.0–17.1)
LYMPH%: 10.1 % — AB (ref 14.0–49.0)
MCH: 29.2 pg (ref 27.2–33.4)
MCHC: 32.2 g/dL (ref 32.0–36.0)
MCV: 90.6 fL (ref 79.3–98.0)
MONO#: 0.7 10*3/uL (ref 0.1–0.9)
MONO%: 9 % (ref 0.0–14.0)
NEUT%: 79 % — ABNORMAL HIGH (ref 39.0–75.0)
NEUTROS ABS: 6.4 10*3/uL (ref 1.5–6.5)
PLATELETS: 266 10*3/uL (ref 140–400)
RBC: 3.97 10*6/uL — ABNORMAL LOW (ref 4.20–5.82)
RDW: 16.6 % — AB (ref 11.0–14.6)
WBC: 8.1 10*3/uL (ref 4.0–10.3)
lymph#: 0.8 10*3/uL — ABNORMAL LOW (ref 0.9–3.3)

## 2014-11-06 MED ORDER — FENTANYL 50 MCG/HR TD PT72: 50.0000 ug | MEDICATED_PATCH | TRANSDERMAL | Status: DC

## 2014-11-06 NOTE — Telephone Encounter (Signed)
Gave adn printed appt sched and avs for pt for NOv

## 2014-11-07 ENCOUNTER — Encounter: Payer: Self-pay | Admitting: Hematology and Oncology

## 2014-11-07 NOTE — Assessment & Plan Note (Addendum)
He has almost completely recovered from side effects of treatment. Continue aggressive supportive care. We will order repeat imaging study in a few months.

## 2014-11-07 NOTE — Assessment & Plan Note (Signed)
This is likely anemia of chronic disease. The patient denies recent history of bleeding such as epistaxis, hematuria or hematochezia. He is asymptomatic from the anemia. We will observe for now.  

## 2014-11-07 NOTE — Progress Notes (Signed)
[place] OFFICE PROGRESS NOTE  Patient Care Team: [name], NP as PCP - General [name], RN as Oncology Nurse Navigator [name], MD as Consulting Physician (Radiation Oncology) [name], MD as Consulting Physician (Hematology and Oncology) [name], RD as Dietitian (Nutrition)  SUMMARY OF ONCOLOGIC HISTORY: Oncology History   Squamous cell carcinoma of larynx   Staging form: Larynx - Supraglottis, AJCC 7th Edition     Clinical stage from 07/20/2014: Stage III (T3, N0, M0) - Signed by [name], MD on 08/01/2014       Squamous cell carcinoma of larynx ([place])   06/22/2014 Imaging CT scan showed greater than 2 cm supraglottic squamous cell carcinoma of the larynx with contralateral extension. No regional pathologic adenopathy   07/12/2014 Pathology Results Accession: [id number] biopsy of larynx show squamous cell carcinoma   07/12/2014 Surgery Laryngoscopy revealed a large exophytic tumor arising from the left supraglottic larynx, aryepiglottic fold. It extended from the anterior commissure and the epiglottis anteriorly, to the arytenoid posteriorly. The entire aryepiglottic fold was involved   07/27/2014 Imaging Staging PET:  1. Intense metabolic activity associated with the left laryngeal mass.  2. Single mildly hypermetabolic small left level 2 lymph node.  No additional evidence of metastatic adenopathy in neck.  3. No evidence of thoracic metastasis.   07/28/2014 Procedure Port-a-cath placed.   08/02/2014 - 08/04/2014 Hospital Admission Admitted to ICU following allergic rxt to Cetuximab resulting in cardiac arrest   08/02/2014 Adverse Reaction The patient received a small amount of cetuximab resulting in cardiac arrest. The medication was permanently discontinued   08/04/2014 Imaging CT scan of abdomen showed neumoperitoneum. 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    08/22/2014 - 10/23/2014 Radiation Therapy He received radiation treatment   10/11/2014 - 10/20/2014 Hospital Admission He was hospitalized for fever, aspiration pneumonia and skin ulceration   10/17/2014 Procedure PEG placed.    INTERVAL HISTORY: Please see below for problem oriented charting. He returns for further follow-up. He is slowly improving. He has stopped losing weight and is able to tolerate tube feeds well. Denies recent infection, fevers or chills. He has productive phlegm but they are less. His pain is under excellent control. He has not use any breakthrough pain medicine for a while. He continues to have dysphagia and has not started on any oral diet. His skin has healed REVIEW OF SYSTEMS:   Constitutional: Denies fevers, chills or abnormal weight loss Eyes: Denies blurriness of vision Cardiovascular: Denies palpitation, chest discomfort or lower extremity swelling Gastrointestinal:  Denies nausea, heartburn or change in bowel habits Skin: Denies abnormal skin rashes Lymphatics: Denies new lymphadenopathy or easy bruising Neurological:Denies numbness, tingling or new weaknesses Behavioral/Psych: Mood is stable, no new changes  All other systems were reviewed with the patient and are negative.  I have reviewed the past medical history, past surgical history, social history and family history with the patient and they are unchanged from previous note.  ALLERGIES:  is allergic to cetuximab.  MEDICATIONS:  Current Outpatient Prescriptions  Medication Sig Dispense Refill  . acetaminophen (TYLENOL) 500 MG tablet Take 1,000 mg by mouth every 6 (six) hours as needed for  mild  pain, moderate pain or headache.    . albuterol (PROVENTIL HFA;VENTOLIN HFA) 108 (90 BASE) MCG/ACT inhaler Inhale 2 puffs into the lungs every 6 (six) hours as needed for wheezing or shortness of breath. 1 Inhaler 0  . ALPRAZolam (XANAX) 0.5 MG tablet Place 1 tablet (0.5 mg total) into feeding tube 3 (three) times daily as needed for anxiety.  0  . aspirin (ASPIRIN CHILDRENS) 81 MG chewable tablet Place 1 tablet (81 mg total) into feeding tube daily. 30 tablet 0  . buPROPion (WELLBUTRIN) 100 MG tablet Place 1.5 tablets (150 mg total) into feeding tube 2 (two) times daily. 90 tablet 0  . carvedilol (COREG) 12.5 MG tablet Place 0.5 tablets (6.25 mg total) into feeding tube 2 (two) times daily. 60 tablet 0  . fentaNYL (DURAGESIC - DOSED MCG/HR) 50 MCG/HR Place 1 patch (50 mcg total) onto the skin every 3 (three) days. 5 patch 0  . lidocaine-prilocaine (EMLA) cream Apply 1 application topically as directed. Apply to affected area once 30 g 3  . Morphine Sulfate (MORPHINE CONCENTRATE) 10 mg / 0.5 ml concentrated solution Take 1 mL (20 mg total) by mouth every 2 (two) hours as needed for severe pain. 240 mL 0  . Nutritional Supplements (FEEDING SUPPLEMENT, OSMOLITE 1.5 CAL,) LIQD Place 237 mLs into feeding tube every 3 (three) hours while awake. 180 Bottle 0  . Water For Irrigation, Sterile (FREE WATER) SOLN Place 240 mLs into feeding tube every 6 (six) hours.     No current facility-administered medications for this visit.    PHYSICAL EXAMINATION: ECOG PERFORMANCE STATUS: 1 - Symptomatic but completely ambulatory  Filed Vitals:   11/06/14 1300  BP: 111/84  Pulse: 82  Temp: 98 F (36.7 C)  Resp: 16   Filed Weights   11/06/14 1300  Weight: 147 lb 12.8 oz (67.042 kg)    GENERAL:alert, no distress and comfortable SKIN: skin color, texture, turgor are normal, no rashes or significant lesions. His skin has healed EYES: normal, Conjunctiva are pink and non-injected, sclera clear OROPHARYNX:no  exudate, no erythema and lips, buccal mucosa, and tongue normal  NECK: supple, thyroid normal size, non-tender, without nodularity LYMPH:  no palpable lymphadenopathy in the cervical, axillary or inguinal LUNGS: clear to auscultation and percussion with normal breathing effort HEART: regular rate & rhythm and no murmurs and no lower extremity edema ABDOMEN:abdomen soft, non-tender and normal bowel sounds. Feeding tube site and stoma site looks okay Musculoskeletal:no cyanosis of digits and no clubbing  NEURO: alert & oriented x 3 with fluent speech, no focal motor/sensory deficits  LABORATORY DATA:  I have reviewed the data as listed    Component Value Date/Time   NA 139 11/06/2014 1249   NA 136 10/20/2014 0540   K 4.2 11/06/2014 1249   K 4.0 10/20/2014 0540   CL 103 10/20/2014 0540   CO2 29 11/06/2014 1249   CO2 28 10/20/2014 0540   GLUCOSE 122 11/06/2014 1249   GLUCOSE 120* 10/20/2014 0540   BUN 16.7 11/06/2014 1249   BUN 11 10/20/2014 0540   CREATININE 0.9 11/06/2014 1249   CREATININE 0.58* 10/20/2014 0540   CREATININE 1.29 01/11/2014 0907   CALCIUM 9.9 11/06/2014 1249   CALCIUM 8.4* 10/20/2014 0540   PROT 6.9 11/06/2014 1249   PROT 5.3* 10/13/2014 0544   ALBUMIN 3.3* 11/06/2014 1249   ALBUMIN 2.6* 10/20/2014 0540   AST 29 11/06/2014 1249   AST 22 10/13/2014 0544   ALT 34 11/06/2014 1249  ALT 11* 10/13/2014 0544   ALKPHOS 138 11/06/2014 1249   ALKPHOS 86 10/13/2014 0544   BILITOT <0.30 11/06/2014 1249   BILITOT 0.5 10/13/2014 0544   GFRNONAA >60 10/20/2014 0540   GFRAA >60 10/20/2014 0540    No results found for: SPEP, UPEP  Lab Results  Component Value Date   WBC 8.1 11/06/2014   NEUTROABS 6.4 11/06/2014   HGB 11.6* 11/06/2014   HCT 36.0* 11/06/2014   MCV 90.6 11/06/2014   PLT 266 11/06/2014      Chemistry      Component Value Date/Time   NA 139 11/06/2014 1249   NA 136 10/20/2014 0540   K 4.2 11/06/2014 1249   K 4.0 10/20/2014 0540   CL 103  10/20/2014 0540   CO2 29 11/06/2014 1249   CO2 28 10/20/2014 0540   BUN 16.7 11/06/2014 1249   BUN 11 10/20/2014 0540   CREATININE 0.9 11/06/2014 1249   CREATININE 0.58* 10/20/2014 0540   CREATININE 1.29 01/11/2014 0907      Component Value Date/Time   CALCIUM 9.9 11/06/2014 1249   CALCIUM 8.4* 10/20/2014 0540   ALKPHOS 138 11/06/2014 1249   ALKPHOS 86 10/13/2014 0544   AST 29 11/06/2014 1249   AST 22 10/13/2014 0544   ALT 34 11/06/2014 1249   ALT 11* 10/13/2014 0544   BILITOT <0.30 11/06/2014 1249   BILITOT 0.5 10/13/2014 0544      ASSESSMENT & PLAN:  Squamous cell carcinoma of larynx He has almost completely recovered from side effects of treatment. Continue aggressive supportive care. We will order repeat imaging study in a few months.  Throat pain in adult His pain is better controlled now that he is healing. I will reduce a patch to 50 g and I recommend he takes breakthrough pain medicine as needed.  Protein calorie malnutrition The patient is not able to tolerate any form of oral intake. The dietitian is concerned he may be at risk of aspiration. We will hold off oral feeding. He will need formal swallow assessment in the future    Anemia in chronic illness This is likely anemia of chronic disease. The patient denies recent history of bleeding such as epistaxis, hematuria or hematochezia. He is asymptomatic from the anemia. We will observe for now.    Orders Placed This Encounter  Procedures  . Ambulatory Referral to Speech Therapy  (specifically to Garald Balding)    Referral Priority:  Routine    Referral Type:  Speech Therapy    Referral Reason:  Specialty Services Required    Requested Specialty:  Speech Pathology    Number of Visits Requested:  1   All questions were answered. The patient knows to call the clinic with any problems, questions or concerns. No barriers to learning was detected. I spent 20 minutes counseling the patient face to face. The  total time spent in the appointment was 25 minutes and more than 50% was on counseling and review of test results     [facility], [place], MD 11/07/2014 9:33 AM

## 2014-11-07 NOTE — Assessment & Plan Note (Signed)
His pain is better controlled now that he is healing. I will reduce a patch to 50 g and I recommend he takes breakthrough pain medicine as needed.

## 2014-11-07 NOTE — Assessment & Plan Note (Signed)
The patient is not able to tolerate any form of oral intake. The dietitian is concerned he may be at risk of aspiration. We will hold off oral feeding. He will need formal swallow assessment in the future

## 2014-11-15 ENCOUNTER — Telehealth: Payer: Self-pay | Admitting: Hematology and Oncology

## 2014-11-15 NOTE — Telephone Encounter (Signed)
returned call and fwd pt to Dr. Gar Ponto office.Marland KitchenMarland Kitchen

## 2014-11-16 ENCOUNTER — Telehealth: Payer: Self-pay | Admitting: *Deleted

## 2014-11-16 ENCOUNTER — Telehealth: Payer: Self-pay

## 2014-11-16 ENCOUNTER — Telehealth: Payer: Self-pay | Admitting: Hematology and Oncology

## 2014-11-16 NOTE — Telephone Encounter (Signed)
[name] called asking when speech study was scheduled for. In basket to [facility] referring her to [facility] dated 10/24. LVM with [name] that scheduler and RN were notified it is not scheduled yet.

## 2014-11-16 NOTE — Telephone Encounter (Signed)
+    Oncology Nurse Navigator Documentation     Patient Visit Type: Follow-up (11/16/14 1342)       Interventions: Coordination of Care (11/16/14 1342)     Returned patient caregiver Tonie's VM in which she indicated they have not received call for swallowing evaluation per their recent visit with Dr. Alvy Bimler.  I noted that I confirmed her referral for Richardson Landry to see Garald Balding, that I called his office, spoke with Almyra Free who confirmed referral receipt and indicated she would call her (Tonie)  to arrange appt.  I provided Carl's phone number, encouraged her to call if she doesn't hear from them by late afternoon.  She verbalized understanding.  [name], RN, BSN, [place] at [place] [phone]           Time Spent with Patient: 15 (11/16/14 1342)

## 2014-11-16 NOTE — Telephone Encounter (Signed)
sw pt again today and f.w him to Dr. Noelle Penner office to sched appt. He was contacted with an actual per at the speech therapist office.

## 2014-11-20 ENCOUNTER — Ambulatory Visit: Payer: Medicaid Other | Attending: Hematology and Oncology

## 2014-11-20 ENCOUNTER — Ambulatory Visit: Payer: Self-pay

## 2014-11-20 DIAGNOSIS — R1313 Dysphagia, pharyngeal phase: Secondary | ICD-10-CM | POA: Insufficient documentation

## 2014-11-20 NOTE — Patient Instructions (Signed)
SWALLOWING EXERCISES Do these until Mother's Day, then 2-3 times per week afterwards   1. Effortful Swallows - Squeeze hard with the muscles in your neck while you swallow your  saliva or a SMALL sip of water - Repeat 20 times, 2-3 times a day, and use whenever you eat or drink  2. Masako Swallow - swallow with your tongue sticking out - Stick tongue out past your teeth and gently bite tongue with your teeth - Swallow, while holding your tongue with your teeth - Repeat 20 times, 2-3 times a day *use a wet spoon if your mouth gets dry*  3. Mendelsohn Maneuver - "half swallow" exercise - Start to swallow, and keep your Adam's apple up by squeezing hard with the muscles of the throat - Hold the squeeze for 5-7 seconds and then relax - Repeat 20 times, 2-3 times a day *use a wet spoon if your mouth gets dry*  4. Breath Hold - Say "HUH!" loudly, then hold your breath for 3 seconds at your voice box - Repeat 20 times, 2-3 times a day  5. Chin pushback - Open your mouth  - Place your fist UNDER your chin near your neck, and push back with your fist for 5 seconds - Repeat 10 times, 2-3 times a day

## 2014-11-21 NOTE — Therapy (Signed)
[facility] [address] [place], [place], [address] Phone: [phone]   Fax:  [phone]  Speech Language Pathology Evaluation  Patient Details  Name: [name] MRN: [id number] Date of Birth: 1955-03-19 Referring Provider: [name]  Encounter Date: 11/20/2014      End of Session - 11/20/14 1649    Visit Number 1   Number of Visits 4   Date for SLP Re-Evaluation 03/13/15   Authorization Type medicaid   SLP Start Time 1450   SLP Stop Time  1530   SLP Time Calculation (min) 40 min   Activity Tolerance Patient tolerated treatment well      Past Medical History  Diagnosis Date  . Carotid stenosis   . Coronary artery disease     s/p CABG 2006; stenting to vein graft 05-2013; NSTEMI 06-2013 in setting of PEA arrest  . Subclavian artery stenosis, left     s/p stenting 2006  . Hypertension   . Hyperlipidemia   . Ischemic cardiomyopathy     echo 06/2013 EF 10-15% (normal 01/2013)  . At risk for sudden cardiac death, has lifevest at discharge 06/20/2013  . Shock liver 06/20/2013  . Acute MI, troponin > 20, no obvious culprit vessel 06/20/2013  . Hypothermia, induced, post arrest 06/20/2013  . Acute encephalopathy, improved 06/20/2013  . Dizziness 06/20/2013  . Weakness due to cardiac arrest 06/20/2013  . Fever, possible aspiration-treated with antibiotics 06/20/2013  . Tobacco abuse     >100 pack year history   . Dyslipidemia 10/09/2013    [place] [facility] atorvastatin -eIRB # [id number] tablet Take 40 mg by mouth daily.   Marland Kitchen PAD (peripheral artery disease) ([place]) 10/09/2013    Occluded left common carotid and moderate right internal carotid artery stenosis. Previous stent to the left subclavian artery. Right iliac stent. Bilateral 60-99% renal artery stenosis    . Heart failure, acute on chronic, systolic and diastolic ([place]) 13/24/4010  . Anxiety   . GERD (gastroesophageal reflux disease)   . 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)  07/14/2014    Past Surgical History  Procedure Laterality Date  . Angioplasty  05/2013    stent to SVG - LAD vein graft at [facility]  . Coronary artery bypass graft  09/2004    SVG to LAD (at [facility])  . Nm myocar perf wall motion  06/2008    persantine myoview - normal pattern of systolic thickening/wall motion/perfusion;  . Lower extremity arterial doppler  08/2008    right and left ABI - mild arterial insufficiency at rest; R CIA/stent with 50-69% narrowing, L CIA with increased velocities (50-69% diameter reduction)  . Renal artery doppler  05/2008    right and left prox renal arteries with narrowing and increased velocities (60-99% diameter reduction)  . Iliac artery stent  07/24/2008    8x6 Smart nitinol self-expanding stent to origin of ilaci down into external iliac crossing the hypogastric artery (Dr. Adora Fridge)  . Subclavian angiogram Left 08/20/2004    8x30mm Genesis balloon mounted stent  . Left heart catheterization with coronary/graft angiogram N/A 06/15/2013    Procedure: LEFT HEART CATHETERIZATION WITH Beatrix Fetters;  Surgeon: Sinclair Grooms, MD;  Location: [facility] CATH LAB;  Service: Cardiovascular;  Laterality: N/A;  . Cardiac catheterization    . Direct laryngoscopy N/A 07/06/2014    Procedure: DIRECT LARYNGOSCOPY AND ESOPAGOSCOPY WITH BIOPSY;  Surgeon: [name], MD;  Location: [facility];  Service: ENT;  Laterality: N/A;  . Laparotomy  N/A 08/05/2014    Procedure: EXPLORATORY LAPAROTOMY WITH SIGMOIDOTOMY WITH COLECTOMY & COLOSTOMY;  Surgeon: Coralie Keens, MD;  Location: [facility];  Service: General;  Laterality: N/A;    There were no vitals filed for this visit.  Visit Diagnosis: Pharyngeal dysphagia      Subjective Assessment - 11/20/14 1454    Subjective Pt reports small sips (approx 1 teaspoon - 1 1/2 teaspoons) do not elicit cough response, 2-3 teaspoon sips produce immediate cough.   Patient is accompained by: Family member            SLP Evaluation [facility] - 11/20/14  1455    SLP Visit Information   SLP Received On 11/20/14   Referring Provider [name]   Medical Diagnosis SCCA of the larynx   General Information   Other Pertinent Information Finished radiation approx 10-23-14.   Prior Functional Status   Cognitive/Linguistic Baseline Within functional limits   Cognition   Overall Cognitive Status Within Functional Limits for tasks assessed   Auditory Comprehension   Overall Auditory Comprehension Appears within functional limits for tasks assessed   Verbal Expression   Overall Verbal Expression Appears within functional limits for tasks assessed   Oral Motor/Sensory Function   Overall Oral Motor/Sensory Function Appears within functional limits for tasks assessed   Labial ROM Within Functional Limits   Labial Strength Within Functional Limits   Labial Coordination WFL   Lingual Symmetry Within Functional Limits   Lingual Strength Within Functional Limits   Lingual Coordination WFL   Velum Within Functional Limits   Motor Speech   Phonation Hoarse  mod harsh   Phonation --      Pt currently tolerates approx 1tsp sips of H2O do not produce signs/symptoms aspiration (cough), whereas 2-3 tsp sips immediately result in coughing. Pt has attempted to take sips H2O small enough to not initiate coughing. Pt is PEG dependent, and finished rad tx 10-23-14.   POs: Pt drank approx 1 tsp sips x3, and ice chips x2 without overt s/s aspiration. Thyroid elevation appeared adequate, and swallows appeared timely. Oral residue noted as WNL. SLP suggested for pt to cont with POs as he has been in the recent past (small sips and ice chips), encouraging him that more swallowing, safely, will assist him in recovering swallow skills more rapidly than not attempting any POs whatsoever.   Do not believe pt is appropriate at this time for modified barium swallow assessment/objective swallowing eval due to limited PO tolerance at this time. Pt likely more of a candidate  in approx 4 weeks.  Because data states the risk for dysphagia during and after radiation treatment is high due to undergoing radiation tx, SLP taught pt about the possibility of reduced/limited ability for PO intake after rad tx. SLP educated pt re: changes to swallowing musculature after rad tx, and why adherence to dysphagia HEP provided today and PO consumption as directed above was necessary to reduce muscular disuse atrophy and to hinder/reduce muscle fibrosis following rad tx. Pt demonstrated understanding of these things to SLP.    After eval tasks, SLP then developed a HEP for pt and pt was instructed how to perform exercises involving lingual, vocal, and pharyngeal strengthening. SLP performed each exercise and pt return demonstrated each exercise. SLP ensured pt performance was correct prior to moving on to next exercise. Pt was instructed to complete this program 2-3 times a day, for approximately 8 weeks, then x2-3 a week after that.  Pt would benefit from skilled  ST as described below in "Clinical Impression Statement".                    SLP Education - 11/20/14 1648    Education provided Yes   Education Details HEP, muscle fibrosis, PO safety (small sips or ice chips with hard swallows for now)   Person(s) Educated Patient;Caregiver(s)   Methods Explanation;Demonstration;Verbal cues;Handout   Comprehension Verbalized understanding;Returned demonstration;Verbal cues required          SLP Short Term Goals - 11/20/14 1655    SLP SHORT TERM GOAL #1   Title --   Baseline --   Time --   Period --   Status --   SLP SHORT TERM GOAL #2   Title --   Baseline --   Time --   Period --   Status --          SLP Long Term Goals - 11/20/14 1658    SLP LONG TERM GOAL #1   Title pt will complete HEP with rare min A over 3 sessions   Baseline total A   Time 3   Period --  visits   Status New   SLP LONG TERM GOAL #2   Title pt will tell SLP why he is  completing HEP   Baseline total A   Time 3   Period --  visits   Status New   SLP LONG TERM GOAL #3   Title pt will tell SLP three s/s aspiration PNA   Baseline not provided yet   Time 3   Period --  visits   Status New          Plan - 11/20/14 1650    Clinical Impression Statement Pt presents today with pharyngeal dysphagia (R13.13) likely due to inadequate airway closure from laryngeal edema resulting from radiation to the larynx (C32.9) and rt neck ending 10-23-14. Skilled ST needed to assess pt's success with procedure of HEP for swallowing musculature as well as to cont assess safety with POs.   Speech Therapy Frequency --  approx every 4 weeks   Duration --  6 visits, however three requested to [facility]   Treatment/Interventions Aspiration precaution training;Pharyngeal strengthening exercises;Diet toleration management by SLP;Trials of upgraded texture/liquids;Oral motor exercises;Compensatory techniques;SLP instruction and feedback   Potential to Achieve Goals Good   SLP Home Exercise Plan provided today   Consulted and Agree with Plan of Care Patient        Problem List Patient Active Problem List   Diagnosis Date Noted  . Aspiration into respiratory tract   . Protein-calorie malnutrition, severe ([place]) 10/13/2014  . Palliative care encounter 10/13/2014  . DNR (do not resuscitate) discussion 10/13/2014  . Pain in throat 10/13/2014  . Increased oropharyngeal secretions   . Laryngeal cancer ([place])   . Skin ulceration ([place]) 10/11/2014  . Fever in adult 10/11/2014  . Dysphagia 10/11/2014  . Infection, respiratory tract 10/10/2014  . Skin sore 10/10/2014  . Protein calorie malnutrition ([place]) 09/21/2014  . Hypotension due to drugs 09/13/2014  . Throat pain in adult 09/06/2014  . Other emphysema ([place])   . Cardiac ischemia   . Perforated bowel ([place])   . Alcohol abuse   . Peritonitis ([place])   . Hypokalemia   . Hypomagnesemia   . Troponin level elevated   .  Bowel perforation ([place]) 08/05/2014  . NSTEMI (non-ST elevated myocardial infarction) ([place]) 08/05/2014  . Drug reaction   . CAD in native artery   .  Sore throat 08/01/2014  . Cancer associated pain 08/01/2014  . 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 Heart failure, acute on chronic, systolic and diastolic ([place]) 19/37/9024  . CAD (coronary artery disease), autologous vein bypass graft 10/09/2013  . Dyslipidemia 10/09/2013  . PAD (peripheral artery disease) ([place]) 10/09/2013  . At risk for sudden cardiac death, has lifevest at discharge 06/20/2013  . Ischemic cardiomyopathy, EF 45% 06/20/2013  . Shock liver 06/20/2013  . Acute MI, troponin > 20, no obvious culprit vessel, NSTEMI anterior wall 06/20/2013  . Hypothermia, induced, post arrest, initially then stopped 06/20/2013  . Acute encephalopathy, improved 06/20/2013  . Dizziness 06/20/2013  . Weakness due to cardiac arrest 06/20/2013  . Fever, possible aspiration-treated with antibiotics 06/20/2013  . Anemia in chronic illness 06/17/2013  . NSVT (nonsustained ventricular tachycardia) ([place]) 06/17/2013  . CAD-hx of CABG '06, DES LCX 01/2013, DES SVG[facility]LAD 5/21/5 at [facility] 06/17/2013  . Acute respiratory failure with hypoxia, intubated on admit, self-extubatedn 06/13/13 06/10/2013  . Cardiogenic shock ([place]) 06/10/2013  . Cardiogenic pulmonary edema, improved 06/10/2013  . Cardiac arrest ([place]) 06/09/2013    [address] , [place], [place]  11/21/2014, 2:52 PM  [facility] [address] [place], [place], [address] Phone: [phone]   Fax:  [phone]  Name: [name] MRN: [id number] Date of Birth: 15-Dec-1955

## 2014-11-23 ENCOUNTER — Ambulatory Visit
Admission: RE | Admit: 2014-11-23 | Discharge: 2014-11-23 | Disposition: A | Discharge status: Home / Self Care | Payer: Medicaid Other | Source: Ambulatory Visit | Attending: Radiation Oncology | Admitting: Radiation Oncology

## 2014-11-23 VITALS — BP 128/74 | HR 91 | Temp 98.0°F | Resp 18 | Wt 149.8 lb

## 2014-11-23 DIAGNOSIS — C329 Malignant neoplasm of larynx, unspecified: Secondary | ICD-10-CM

## 2014-11-23 NOTE — Progress Notes (Signed)
   Department of Radiation Oncology  Phone:  [phone] Fax:        [phone]   Name: [name] MRN: [id number]  DOB: 02/02/1955  Date: 11/23/2014  Follow Up Visit Note  Diagnosis: Squamous cell carcinoma of larynx [facility])   Staging form: Larynx - Supraglottis, AJCC 7th Edition     Clinical stage from 07/20/2014: Stage III (T3, N0, M0) - Signed by [name], MD on 08/01/2014   Summary and Interval since last radiation: Radiation completed 10/23/14.  Interval History: [name] presents today for routine followup.  He is improving. He is still using his peg tube for most of his nutrition. He saw Dr. Alvy [name] recently who reduced his Duragesic patch to 50 micrograms. She was concerned about aspiration and referred him to speech therapy. He does his neck exercises and takes small sips of water. Follow up out patient therapy was recommended. He is scheduled with a follow up with Dr. Alvy [name] on Monday. He will be due for a PET scan in January. Feels like he has a chunk of meat in his throat and has a dry mouth. Pain well managed with Fetanyl patch. He has some depression due to not being able to work.  Physical Exam:  Filed Vitals:   11/23/14 1500  BP: 128/74  Pulse: 91  Temp: 98 F (36.7 C)  Resp: 18  Weight: 149 lb 12.8 oz (67.949 kg)  SpO2: 99%    IMPRESSION: [name] is a 59 y.o. 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  PLAN: He will follow up with speech pathologist for speech exercises. He will be referred to [facility] program to increase stamina. He will be scheduled for a PET scan in January and follow up with me to review this scan. He will follow up with Dr. Constance [name] in December for first laryngoscopy and follow up with [name] for speech therapy. He will hopefully be able to do 2 more speech therapy sessions before the end of the year and will do 3 more sessions after the new year because that is all Medicaid allows  a year.   Edward Silversmith, MD    This document serves as a record of services personally performed by Edward Silversmith, MD. It was created on her behalf by  [name], a trained medical scribe. The creation of this record is based on the scribe's personal observations and the provider's statements to them. This document has been checked and approved by the attending provider.

## 2014-11-23 NOTE — Progress Notes (Signed)
NURSING EVALUATION  Radiation Oncology: Dr. [name]  Reason for visit: Follow-up for squamous cell carcinoma of the larynx  Last Radiation Treatment: 10/23/14-IMRT; Larynx 70 Gy at 2 Gy per fractions for 35 fractions with at risk lymph nodes receiving 63 Gy at 1.8 Gy per fraction and 56 Gy at 1.6 Gy per fraction. He was treated 6 fractions per week with radiation alone.   Most recent ENT visit: None yet (Dr. [name] is his ENT)  Most recent TSH: No baseline value available for review  Signs/Symptoms:   Dry mouth: Yes, "all the time"   Weight changes, if any: Gain  Dysphagia, if any: Yes; "I feel like there is a chunk of meat stuck in my throat."  He reports pain with swallowing. Is able to tolerate small sips of water.   Lymphedema, if any: No  Pain issues, if any: Well-managed with Fentanyl patch 50 mcg  Nutrition/G-tube: Osmolite 1.5 formula; 1.5 cans 3 x/day.  Concerned that 3 of the "buttons" from the G-tube have fallen off.    Mood/affect: Endorses depression from not being able to work.    Fatigue: Decreased stamina/endurance/exercise tolerance.   Tobacco/ETOH use: Reports that he is not currently smoking. Is not consuming alcohol.  Encouraged continued cessation from both.   Vitals:  Filed Vitals:   11/23/14 1500  BP: 128/74  Pulse: 91  Temp: 98 F (36.7 C)  Resp: 18   Weight:  Filed Weights   11/23/14 1500  Weight: 149 lb 12.8 oz (67.949 kg)    Current complaints/concerns: Mr. Kreger is here for routine follow-up. Reassured Mr. Dorsey that the "buttons" coming off of his G-tube is common and to be expected; the G-tube is held in place by an internal balloon.  He did not want to take the dressing off of the G-tube site to have me take a look at it today because "it took me an hour to get this dressing on."  Reports needing more of the Medi-pore tape, which was provided to him today.  Encouraged patient to begin to increase his stamina/exercise tolerance  with walking (as he reports having to walk long distances for his job, when he is able to return to work).  Dr. Pablo Ledger will talk to him about the [facility] program; a brochure was given.   He will have PET scan in 01/2015 and a visit with Dr. Pablo Ledger to review results.  He will likely need to see Dr. [name] in 12/2014 for exam and laryngoscopy.    Mike Craze, NP 11/23/14

## 2014-11-24 ENCOUNTER — Telehealth: Payer: Self-pay | Admitting: *Deleted

## 2014-11-24 NOTE — Telephone Encounter (Signed)
CALLED PATIENT TO INFORM OF PET SCAN ON 01/31/15  @ 10 AM @ [facility] AND HIS APPT. WITH DR. [name] ON 12-19-14 - ARRIVAL TIME - 1 PM, LVM FOR A RETURN CALL

## 2014-11-27 ENCOUNTER — Encounter: Payer: Self-pay | Admitting: Hematology and Oncology

## 2014-11-27 ENCOUNTER — Ambulatory Visit (HOSPITAL_BASED_OUTPATIENT_CLINIC_OR_DEPARTMENT_OTHER): Payer: Medicaid Other | Admitting: Hematology and Oncology

## 2014-11-27 ENCOUNTER — Telehealth: Payer: Self-pay | Admitting: Hematology and Oncology

## 2014-11-27 ENCOUNTER — Encounter: Payer: Self-pay | Admitting: *Deleted

## 2014-11-27 VITALS — BP 109/62 | HR 80 | Temp 97.7°F | Resp 18 | Ht 67.0 in | Wt 149.1 lb

## 2014-11-27 DIAGNOSIS — C321 Malignant neoplasm of supraglottis: Secondary | ICD-10-CM | POA: Diagnosis not present

## 2014-11-27 DIAGNOSIS — R070 Pain in throat: Secondary | ICD-10-CM | POA: Diagnosis not present

## 2014-11-27 DIAGNOSIS — E46 Unspecified protein-calorie malnutrition: Secondary | ICD-10-CM | POA: Diagnosis not present

## 2014-11-27 DIAGNOSIS — C329 Malignant neoplasm of larynx, unspecified: Secondary | ICD-10-CM

## 2014-11-27 MED ORDER — FENTANYL 12 MCG/HR TD PT72: 12.0000 ug | MEDICATED_PATCH | TRANSDERMAL | Status: DC

## 2014-11-27 NOTE — Progress Notes (Signed)
  Oncology Nurse Navigator Documentation   Navigator Encounter Type: Clinic/MDC (11/27/14 1210) Patient Visit Type: Medonc (11/27/14 1210) Treatment Phase: Other (11/27/14 1210)     To provide support and encouragement, care continuity and to assess for needs, met with patient during est pt appt with Dr. Alvy Bimler.  He was accompanied by caregiver Vivien Rota. He reported:  Has gained 4 lbs.  Using PEG exclusively for nutrition and water.  Not eating by mouth for fear of aspiration.  He was encouraged by this navigator and Dr. Alvy Bimler to try small amounts of soft foods eg eggs, yogurt, mashed potatoes and gravy, cottage cheese.  Met with Garald Balding recently, was provided oral and written instructions for swallowing exercises.  He indicated he has not been doing them, I encouraged him to do so.  Walking daily.  Wants to return to work starting next Monday.  Dr. Alvy Bimler provided medical release letter. He understands he can contact me with needs/concerns.  [name], RN, BSN, [place] [facility] [place] [phone]                Time Spent with Patient: 30 (11/27/14 1210)

## 2014-11-27 NOTE — Telephone Encounter (Signed)
Gave and printed appts ched and avs for pt for DEC  °

## 2014-11-27 NOTE — Assessment & Plan Note (Signed)
His pain is better controlled now that he is healing. I will reduce a patch to 12 g and I recommend he takes breakthrough pain medicine as needed.

## 2014-11-27 NOTE — Progress Notes (Signed)
[place] OFFICE PROGRESS NOTE  Patient Care Team: [name], NP as PCP - General [name], RN as Oncology Nurse Navigator [name], MD as Consulting Physician (Radiation Oncology) [name], MD as Consulting Physician (Hematology and Oncology) [name], RD as Dietitian (Nutrition)  SUMMARY OF ONCOLOGIC HISTORY: Oncology History   Squamous cell carcinoma of larynx   Staging form: Larynx - Supraglottis, AJCC 7th Edition     Clinical stage from 07/20/2014: Stage III (T3, N0, M0) - Signed by [name], MD on 08/01/2014       Squamous cell carcinoma of larynx ([place])   06/22/2014 Imaging CT scan showed greater than 2 cm supraglottic squamous cell carcinoma of the larynx with contralateral extension. No regional pathologic adenopathy   07/12/2014 Pathology Results Accession: [id number] biopsy of larynx show squamous cell carcinoma   07/12/2014 Surgery Laryngoscopy revealed a large exophytic tumor arising from the left supraglottic larynx, aryepiglottic fold. It extended from the anterior commissure and the epiglottis anteriorly, to the arytenoid posteriorly. The entire aryepiglottic fold was involved   07/27/2014 Imaging Staging PET:  1. Intense metabolic activity associated with the left laryngeal mass.  2. Single mildly hypermetabolic small left level 2 lymph node.  No additional evidence of metastatic adenopathy in neck.  3. No evidence of thoracic metastasis.   07/28/2014 Procedure Port-a-cath placed.   08/02/2014 - 08/04/2014 Hospital Admission Admitted to ICU following allergic rxt to Cetuximab resulting in cardiac arrest   08/02/2014 Adverse Reaction The patient received a small amount of cetuximab resulting in cardiac arrest. The medication was permanently discontinued   08/04/2014 Imaging CT scan of abdomen showed neumoperitoneum. 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    08/22/2014 - 10/23/2014 Radiation Therapy He received radiation treatment   10/11/2014 - 10/20/2014 Hospital Admission He was hospitalized for fever, aspiration pneumonia and skin ulceration   10/17/2014 Procedure PEG placed.    INTERVAL HISTORY: Please see below for problem oriented charting. He is seen for further follow-up. He is doing well. He has not tried any oral diet. He tolerated 4 cans of nutritional supplement well. He wants to go back to work. He denies pain.  REVIEW OF SYSTEMS:   Constitutional: Denies fevers, chills or abnormal weight loss Eyes: Denies blurriness of vision Ears, nose, mouth, throat, and face: Denies mucositis or sore throat Respiratory: Denies cough, dyspnea or wheezes Cardiovascular: Denies palpitation, chest discomfort or lower extremity swelling Gastrointestinal:  Denies nausea, heartburn or change in bowel habits Skin: Denies abnormal skin rashes Lymphatics: Denies new lymphadenopathy or easy bruising Neurological:Denies numbness, tingling or new weaknesses Behavioral/Psych: Mood is stable, no new changes  All other systems were reviewed with the patient and are negative.  I have reviewed the past medical history, past surgical history, social history and family history with the patient and they are unchanged from previous note.  ALLERGIES:  is allergic to cetuximab.  MEDICATIONS:  Current Outpatient Prescriptions  Medication Sig Dispense Refill  . acetaminophen (TYLENOL) 500 MG tablet Take 1,000 mg by mouth every 6 (six) hours as needed for mild pain, moderate pain or headache.    . albuterol (PROVENTIL HFA;VENTOLIN HFA) 108 (90  BASE) MCG/ACT  inhaler Inhale 2 puffs into the lungs every 6 (six) hours as needed for wheezing or shortness of breath. 1 Inhaler 0  . ALPRAZolam (XANAX) 0.5 MG tablet Place 1 tablet (0.5 mg total) into feeding tube 3 (three) times daily as needed for anxiety.  0  . aspirin (ASPIRIN CHILDRENS) 81 MG chewable tablet Place 1 tablet (81 mg total) into feeding tube daily. 30 tablet 0  . buPROPion (WELLBUTRIN) 100 MG tablet Place 1.5 tablets (150 mg total) into feeding tube 2 (two) times daily. 90 tablet 0  . carvedilol (COREG) 12.5 MG tablet Place 0.5 tablets (6.25 mg total) into feeding tube 2 (two) times daily. 60 tablet 0  . lidocaine-prilocaine (EMLA) cream Apply 1 application topically as directed. Apply to affected area once 30 g 3  . Nutritional Supplements (FEEDING SUPPLEMENT, OSMOLITE 1.5 CAL,) LIQD Place 237 mLs into feeding tube every 3 (three) hours while awake. 180 Bottle 0  . Water For Irrigation, Sterile (FREE WATER) SOLN Place 240 mLs into feeding tube every 6 (six) hours.    . fentaNYL (DURAGESIC - DOSED MCG/HR) 12 MCG/HR Place 1 patch (12.5 mcg total) onto the skin every 3 (three) days. 5 patch 0  . Morphine Sulfate (MORPHINE CONCENTRATE) 10 mg / 0.5 ml concentrated solution Take 1 mL (20 mg total) by mouth every 2 (two) hours as needed for severe pain. (Patient not taking: Reported on 11/27/2014) 240 mL 0   No current facility-administered medications for this visit.    PHYSICAL EXAMINATION: ECOG PERFORMANCE STATUS: 0 - Asymptomatic  Filed Vitals:   11/27/14 1207  BP: 109/62  Pulse: 80  Temp: 97.7 F (36.5 C)  Resp: 18   Filed Weights   11/27/14 1207  Weight: 149 lb 1.6 oz (67.631 kg)    GENERAL:alert, no distress and comfortable SKIN: skin color, texture, turgor are normal, no rashes or significant lesions EYES: normal, Conjunctiva are pink and non-injected, sclera clear OROPHARYNX:no exudate, no erythema and lips, buccal mucosa, and tongue normal  NECK: supple, thyroid normal  size, non-tender, without nodularity LYMPH:  no palpable lymphadenopathy in the cervical, axillary or inguinal LUNGS: clear to auscultation and percussion with normal breathing effort HEART: regular rate & rhythm and no murmurs and no lower extremity edema ABDOMEN:abdomen soft, non-tender and normal bowel sounds. Feeding tube site looks okay. Stoma looks okay. Musculoskeletal:no cyanosis of digits and no clubbing  NEURO: alert & oriented x 3 with fluent speech, no focal motor/sensory deficits  LABORATORY DATA:  I have reviewed the data as listed    Component Value Date/Time   NA 139 11/06/2014 1249   NA 136 10/20/2014 0540   K 4.2 11/06/2014 1249   K 4.0 10/20/2014 0540   CL 103 10/20/2014 0540   CO2 29 11/06/2014 1249   CO2 28 10/20/2014 0540   GLUCOSE 122 11/06/2014 1249   GLUCOSE 120* 10/20/2014 0540   BUN 16.7 11/06/2014 1249   BUN 11 10/20/2014 0540   CREATININE 0.9 11/06/2014 1249   CREATININE 0.58* 10/20/2014 0540   CREATININE 1.29 01/11/2014 0907   CALCIUM 9.9 11/06/2014 1249   CALCIUM 8.4* 10/20/2014 0540   PROT 6.9 11/06/2014 1249   PROT 5.3* 10/13/2014 0544   ALBUMIN 3.3* 11/06/2014 1249   ALBUMIN 2.6* 10/20/2014 0540   AST 29 11/06/2014 1249   AST 22 10/13/2014 0544   ALT 34 11/06/2014 1249   ALT 11* 10/13/2014 0544   ALKPHOS 138 11/06/2014 1249   ALKPHOS 86 10/13/2014 0544  BILITOT <0.30 11/06/2014 1249   BILITOT 0.5 10/13/2014 0544   GFRNONAA >60 10/20/2014 0540   GFRAA >60 10/20/2014 0540    No results found for: SPEP, UPEP  Lab Results  Component Value Date   WBC 8.1 11/06/2014   NEUTROABS 6.4 11/06/2014   HGB 11.6* 11/06/2014   HCT 36.0* 11/06/2014   MCV 90.6 11/06/2014   PLT 266 11/06/2014      Chemistry      Component Value Date/Time   NA 139 11/06/2014 1249   NA 136 10/20/2014 0540   K 4.2 11/06/2014 1249   K 4.0 10/20/2014 0540   CL 103 10/20/2014 0540   CO2 29 11/06/2014 1249   CO2 28 10/20/2014 0540   BUN 16.7 11/06/2014 1249    BUN 11 10/20/2014 0540   CREATININE 0.9 11/06/2014 1249   CREATININE 0.58* 10/20/2014 0540   CREATININE 1.29 01/11/2014 0907      Component Value Date/Time   CALCIUM 9.9 11/06/2014 1249   CALCIUM 8.4* 10/20/2014 0540   ALKPHOS 138 11/06/2014 1249   ALKPHOS 86 10/13/2014 0544   AST 29 11/06/2014 1249   AST 22 10/13/2014 0544   ALT 34 11/06/2014 1249   ALT 11* 10/13/2014 0544   BILITOT <0.30 11/06/2014 1249   BILITOT 0.5 10/13/2014 0544      ASSESSMENT & PLAN:  Squamous cell carcinoma of larynx He is doing very well and is gaining weight. I encouraged the patient to try oral diet. I am weaning him off fentanyl patch. The patient wants to go back to work and I have given him a work release to return back to work as tolerated. I will see him back a month from now for further supportive care.  Protein calorie malnutrition The patient has not tried any form of oral intake. He had recent formal swallow assessment with another follow-up in the future I encouraged him to try semisolid diet as tolerated.  Pain in throat His pain is better controlled now that he is healing. I will reduce a patch to 12 g and I recommend he takes breakthrough pain medicine as needed.    No orders of the defined types were placed in this encounter.   All questions were answered. The patient knows to call the clinic with any problems, questions or concerns. No barriers to learning was detected. I spent 15 minutes counseling the patient face to face. The total time spent in the appointment was 20 minutes and more than 50% was on counseling and review of test results     [facility], [employer], MD 11/27/2014 12:35 PM

## 2014-11-27 NOTE — Assessment & Plan Note (Signed)
He is doing very well and is gaining weight. I encouraged the patient to try oral diet. I am weaning him off fentanyl patch. The patient wants to go back to work and I have given him a work release to return back to work as tolerated. I will see him back a month from now for further supportive care.

## 2014-11-27 NOTE — Assessment & Plan Note (Addendum)
The patient has not tried any form of oral intake. He had recent formal swallow assessment with another follow-up in the future I encouraged him to try semisolid diet as tolerated.

## 2014-11-28 ENCOUNTER — Other Ambulatory Visit: Payer: Self-pay | Admitting: Surgery

## 2014-12-01 ENCOUNTER — Other Ambulatory Visit: Payer: Self-pay | Admitting: Hematology and Oncology

## 2014-12-02 ENCOUNTER — Other Ambulatory Visit: Payer: Self-pay | Admitting: Cardiovascular Disease

## 2015-01-01 ENCOUNTER — Ambulatory Visit: Payer: Medicaid Other | Attending: Hematology and Oncology

## 2015-01-01 ENCOUNTER — Ambulatory Visit: Payer: Medicaid Other

## 2015-01-01 ENCOUNTER — Ambulatory Visit (HOSPITAL_BASED_OUTPATIENT_CLINIC_OR_DEPARTMENT_OTHER): Payer: Medicaid Other | Admitting: Hematology and Oncology

## 2015-01-01 ENCOUNTER — Encounter: Payer: Self-pay | Admitting: Hematology and Oncology

## 2015-01-01 VITALS — BP 120/69 | HR 83 | Temp 97.7°F | Resp 18 | Ht 67.0 in | Wt 160.5 lb

## 2015-01-01 DIAGNOSIS — C321 Malignant neoplasm of supraglottis: Secondary | ICD-10-CM | POA: Diagnosis not present

## 2015-01-01 DIAGNOSIS — C329 Malignant neoplasm of larynx, unspecified: Secondary | ICD-10-CM

## 2015-01-01 NOTE — Assessment & Plan Note (Signed)
He is doing very well and is gaining weight.  I recommend he increase oral diet intake and try to wean off nutritional supplements. He has self discontinued oral pain medicine  The patient had PET CT scan scheduled next month. Clinically, he had no evidence of disease. He will see ENT today for further follow-up. After PET/CT scan result is available, if he has no evidence of disease, he will proceed for port removal, feeding tube removal and reversal of temporary colostomy. In the future, I will get him to follow with cancer survivorship clinic in 3 months.

## 2015-01-01 NOTE — Progress Notes (Signed)
[place] OFFICE PROGRESS NOTE  Patient Care Team: [name], NP as PCP - General [name], RN as Oncology Nurse Navigator [name], MD as Consulting Physician (Radiation Oncology) [name], MD as Consulting Physician (Hematology and Oncology) [name], RD as Dietitian (Nutrition) Coralie Keens, MD as Consulting Physician (General Surgery) Holley Bouche, NP as Nurse Practitioner (Nurse Practitioner)  SUMMARY OF ONCOLOGIC HISTORY: Oncology History   Squamous cell carcinoma of larynx   Staging form: Larynx - Supraglottis, AJCC 7th Edition     Clinical stage from 07/20/2014: Stage III (T3, N0, M0) - Signed by [name], MD on 08/01/2014       Squamous cell carcinoma of larynx ([place])   06/22/2014 Imaging CT scan showed greater than 2 cm supraglottic squamous cell carcinoma of the larynx with contralateral extension. No regional pathologic adenopathy   07/12/2014 Pathology Results Accession: [id number] biopsy of larynx show squamous cell carcinoma   07/12/2014 Surgery Laryngoscopy revealed a large exophytic tumor arising from the left supraglottic larynx, aryepiglottic fold. It extended from the anterior commissure and the epiglottis anteriorly, to the arytenoid posteriorly. The entire aryepiglottic fold was involved   07/27/2014 Imaging Staging PET:  1. Intense metabolic activity associated with the left laryngeal mass.  2. Single mildly hypermetabolic small left level 2 lymph node.  No additional evidence of metastatic adenopathy in neck.  3. No evidence of thoracic metastasis.   07/28/2014 Procedure Port-a-cath placed.   08/02/2014 - 08/04/2014 Hospital Admission Admitted to ICU following allergic rxt to Cetuximab resulting in cardiac arrest   08/02/2014 Adverse Reaction The patient received a small amount of cetuximab resulting in cardiac arrest. The medication was permanently discontinued   08/04/2014 Imaging CT scan of abdomen showed  neumoperitoneum. 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    08/22/2014 - 10/23/2014 Radiation Therapy Rec'd IMRT with helical tomotherapy: Larynx 70 Gy at 2 Gy per fractions for 35 fractions with at risk lymph nodes receiving 63 Gy at 1.8 Gy per fraction and 56 Gy at 1.6 Gy per fraction. He was treated 6 fractions per week with radiation alone.     10/11/2014 - 10/20/2014 Hospital Admission He was hospitalized for fever, aspiration pneumonia and skin ulceration   10/17/2014 Procedure PEG placed.    INTERVAL HISTORY: Please see below for problem oriented charting.  he returns for further follow-up. He has gained almost 12 pounds of weight. He has been swallowing soft diet. He is still using 6 cans of nutritional supplement per day. He denies pain and has self discontinued all pain medicine.  he denies recent dysphagia.  REVIEW OF SYSTEMS:   Constitutional: Denies fevers, chills or abnormal weight loss Eyes: Denies blurriness of vision Ears, nose, mouth, throat, and face: Denies mucositis or sore throat Respiratory: Denies cough, dyspnea or wheezes Cardiovascular: Denies palpitation, chest discomfort or lower extremity swelling Gastrointestinal:  Denies nausea, heartburn or change in bowel habits Skin: Denies abnormal skin rashes Lymphatics: Denies new lymphadenopathy or easy bruising Neurological:Denies numbness, tingling or new weaknesses Behavioral/Psych: Mood is stable, no new changes  All other systems were reviewed with the patient and are negative.  I have reviewed the past  medical history, past surgical  history, social history and family history with the patient and they are unchanged from previous note.  ALLERGIES:  is allergic to cetuximab.  MEDICATIONS:  Current Outpatient Prescriptions  Medication Sig Dispense Refill  . ALPRAZolam (XANAX) 0.5 MG tablet Place 1 tablet (0.5 mg total) into feeding tube 3 (three) times daily as needed for anxiety.  0  . aspirin (ASPIRIN CHILDRENS) 81 MG chewable tablet Place 1 tablet (81 mg total) into feeding tube daily. 30 tablet 0  . buPROPion (WELLBUTRIN) 100 MG tablet Place 1.5 tablets (150 mg total) into feeding tube 2 (two) times daily. 90 tablet 0  . carvedilol (COREG) 12.5 MG tablet Place 0.5 tablets (6.25 mg total) into feeding tube 2 (two) times daily. 60 tablet 0  . lidocaine-prilocaine (EMLA) cream Apply 1 application topically as directed. Apply to affected area once 30 g 3  . Nutritional Supplements (FEEDING SUPPLEMENT, OSMOLITE 1.5 CAL,) LIQD Place 237 mLs into feeding tube every 3 (three) hours while awake. 180 Bottle 0  . Water For Irrigation, Sterile (FREE WATER) SOLN Place 240 mLs into feeding tube every 6 (six) hours.     No current facility-administered medications for this visit.    PHYSICAL EXAMINATION: ECOG PERFORMANCE STATUS: 0 - Asymptomatic  Filed Vitals:   01/01/15 1216  BP: 120/69  Pulse: 83  Temp: 97.7 F (36.5 C)  Resp: 18   Filed Weights   01/01/15 1216  Weight: 160 lb 8 oz (72.802 kg)    GENERAL:alert, no distress and comfortable SKIN: skin color, texture, turgor are normal, no rashes or significant lesions EYES: normal, Conjunctiva are pink and non-injected, sclera clear OROPHARYNX:no exudate, no erythema and lips, buccal mucosa, and tongue normal  Musculoskeletal:no cyanosis of digits and no clubbing  NEURO: alert & oriented x 3 with fluent speech, no focal motor/sensory deficits  LABORATORY DATA:  I have reviewed the data as listed    Component Value Date/Time   NA 139 11/06/2014 1249   NA 136  10/20/2014 0540   K 4.2 11/06/2014 1249   K 4.0 10/20/2014 0540   CL 103 10/20/2014 0540   CO2 29 11/06/2014 1249   CO2 28 10/20/2014 0540   GLUCOSE 122 11/06/2014 1249   GLUCOSE 120* 10/20/2014 0540   BUN 16.7 11/06/2014 1249   BUN 11 10/20/2014 0540   CREATININE 0.9 11/06/2014 1249   CREATININE 0.58* 10/20/2014 0540   CREATININE 1.29 01/11/2014 0907   CALCIUM 9.9 11/06/2014 1249   CALCIUM 8.4* 10/20/2014 0540   PROT 6.9 11/06/2014 1249   PROT 5.3* 10/13/2014 0544   ALBUMIN 3.3* 11/06/2014 1249   ALBUMIN 2.6* 10/20/2014 0540   AST 29 11/06/2014 1249   AST 22 10/13/2014 0544   ALT 34 11/06/2014 1249   ALT 11* 10/13/2014 0544   ALKPHOS 138 11/06/2014 1249   ALKPHOS 86 10/13/2014 0544   BILITOT <0.30 11/06/2014 1249   BILITOT 0.5 10/13/2014 0544   GFRNONAA >60 10/20/2014 0540   GFRAA >60 10/20/2014 0540    No results found for: SPEP, UPEP  Lab Results  Component Value Date   WBC 8.1 11/06/2014   NEUTROABS 6.4 11/06/2014   HGB 11.6* 11/06/2014   HCT 36.0* 11/06/2014   MCV 90.6 11/06/2014   PLT 266 11/06/2014      Chemistry      Component Value Date/Time   NA 139 11/06/2014 1249   NA 136 10/20/2014 0540   K 4.2 11/06/2014 1249   K 4.0 10/20/2014 0540  CL 103 10/20/2014 0540   CO2 29 11/06/2014 1249   CO2 28 10/20/2014 0540   BUN 16.7 11/06/2014 1249   BUN 11 10/20/2014 0540   CREATININE 0.9 11/06/2014 1249   CREATININE 0.58* 10/20/2014 0540   CREATININE 1.29 01/11/2014 0907      Component Value Date/Time   CALCIUM 9.9 11/06/2014 1249   CALCIUM 8.4* 10/20/2014 0540   ALKPHOS 138 11/06/2014 1249   ALKPHOS 86 10/13/2014 0544   AST 29 11/06/2014 1249   AST 22 10/13/2014 0544   ALT 34 11/06/2014 1249   ALT 11* 10/13/2014 0544   BILITOT <0.30 11/06/2014 1249   BILITOT 0.5 10/13/2014 0544      ASSESSMENT & PLAN:  Squamous cell carcinoma of larynx He is doing very well and is gaining weight.  I recommend he increase oral diet intake and try to wean off  nutritional supplements. He has self discontinued oral pain medicine  The patient had PET CT scan scheduled next month. Clinically, he had no evidence of disease. He will see ENT today for further follow-up. After PET/CT scan result is available, if he has no evidence of disease, he will proceed for port removal, feeding tube removal and reversal of temporary colostomy. In the future, I will get him to follow with cancer survivorship clinic in 3 months.   Orders Placed This Encounter  Procedures  . Amb Referral to Survivorship Program    Referral Priority:  Routine    Referral Type:  Consultation    Referred to Provider:  Holley Bouche, NP    Number of Visits Requested:  1   All questions were answered. The patient knows to call the clinic with any problems, questions or concerns. No barriers to learning was detected. I spent 15 minutes counseling the patient face to face. The total time spent in the appointment was 20 minutes and more than 50% was on counseling and review of test results     [facility], [place], MD 01/01/2015 1:01 PM

## 2015-01-02 ENCOUNTER — Telehealth: Payer: Self-pay | Admitting: Hematology and Oncology

## 2015-01-02 NOTE — Telephone Encounter (Signed)
perp of to sch pt appt-sent Gretchen email to sch for surviorship

## 2015-01-03 ENCOUNTER — Ambulatory Visit (INDEPENDENT_AMBULATORY_CARE_PROVIDER_SITE_OTHER): Payer: Medicaid Other | Admitting: Cardiovascular Disease

## 2015-01-03 ENCOUNTER — Encounter: Payer: Self-pay | Admitting: Cardiovascular Disease

## 2015-01-03 VITALS — BP 106/74 | HR 82 | Resp 16 | Ht 67.0 in | Wt 161.5 lb

## 2015-01-03 DIAGNOSIS — I25810 Atherosclerosis of coronary artery bypass graft(s) without angina pectoris: Secondary | ICD-10-CM

## 2015-01-03 DIAGNOSIS — I5042 Chronic combined systolic (congestive) and diastolic (congestive) heart failure: Secondary | ICD-10-CM

## 2015-01-03 DIAGNOSIS — E785 Hyperlipidemia, unspecified: Secondary | ICD-10-CM

## 2015-01-03 DIAGNOSIS — I2510 Atherosclerotic heart disease of native coronary artery without angina pectoris: Secondary | ICD-10-CM

## 2015-01-03 DIAGNOSIS — C329 Malignant neoplasm of larynx, unspecified: Secondary | ICD-10-CM

## 2015-01-03 DIAGNOSIS — I255 Ischemic cardiomyopathy: Secondary | ICD-10-CM

## 2015-01-03 DIAGNOSIS — Z79899 Other long term (current) drug therapy: Secondary | ICD-10-CM

## 2015-01-03 MED ORDER — CARVEDILOL 6.25 MG PO TABS: 6.2500 mg | ORAL_TABLET | Freq: Two times a day (BID) | ORAL | Status: DC

## 2015-01-03 NOTE — Patient Instructions (Signed)
A NEW RX HAS BEEN SENT TO YOUR PHARMACY FOR CARVEDILOL 6.25 MG TABLETS.  Your physician recommends that you return for lab work in: [place] [facility].  Dr. Sallyanne Kuster recommends that you schedule a follow-up appointment in: [place] THAT ACCEPT MEDICAID.

## 2015-01-05 ENCOUNTER — Encounter: Payer: Self-pay | Admitting: Cardiovascular Disease

## 2015-01-05 NOTE — Progress Notes (Signed)
Patient ID: [name], male   DOB: [birth year], 59 y.o.   MRN: [id number]    Cardiology Office Note    Date:  01/05/2015   ID:  [name], DOB 1955-03-12, MRN [id number]  PCP:  Imelda Pillow, NP  Cardiologist:   Sanda Klein, MD   Chief Complaint  Patient presents with  . Follow-up    no chest pain,  no shortness of breath, no edema, no pain or cramping in legs, has after bending over- lightheaded or dizziness    History of Present Illness:  [name] is a 59 y.o. 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 Additional problems include treated hypertension and hyperlipidemia and peripheral artery disease status post stent of the left subclavian artery in 2006, occluded left common carotid artery, moderate right internal carotid artery stenosis, history of right iliac artery stent, bilateral moderate to severe renal artery stenosis. Used to be a heavy smoker with more than 100-pack-year history and has had problems with prolonged intubation following surgery..  Over the last few months his health problems have centered around the new diagnosis ofsquamous  cell carcinoma of the  larynx and he has undergone surgery, radiation and chemotherapy. He had expected problems with swallowing and lost tremendous weight, down to 140 pounds. he has a gastrostomy tube through which she administers nutritional supplements, but is starting to swallow soft foods as well. He has gained back some of the weight and now is at a healthy 161 pounds. Blood pressure medications were curtailed after his marked weight loss. He is no longer receiving an ACE inhibitor and is on  a lower dose of beta blocker than in the past. 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 Just before that he had suffered a cardiac arrest after initiation of cetuximab therapy   Remarkably, after cheating death at least 3 times in the last 2 years, [name] is back full-time at work.   Multiple events related to CAD in 2015:  - CAD S/P CABG in '06 (SVG-LAD).  - January 2015 acute MI, proximal circumflex artery drug-eluting 2.5x16 mm Xience stent at [facility]  - May 2015 he went to his PCP for shoulder pain and his Troponin was elevated. He had stenosis in the SVG-LAD and a DES placed on 06/02/13 at [facility]. He was discharged on Plavix.  - 06/09/13 he was found unresponsive at home by his son. CPR was started and EMS called. He was noted to be in VF. He was transported to [facility]. He had shock, respiratory failure, shock liver, and acute renal insufficiency. Cath revealed widely patent saphenous vein graft to the mid LAD (stented with no evidence of thrombus or restenosis). Widely patent proximal to mid circumflex stent, with haziness in the circumflex proximal to the stent but no significant obstruction. Mild to moderate distal left main stenosis (30-40%) with 80-90% ostial LAD, threatening a small to moderate diagonal territory in the distribution of the wall  motion abnormality. The mid LAD off the retrograde segment from the graft insertion site was occluded. Widely patent RCA. A Myoview showed no ischemia. Echo showed left ventricular ejection fraction of 10-15%. His P2Y12 was elevated and he was taken off Plavix and put on Brilinita. Discharged 06/20/13 with a Life Vest.  - 09/27/13 ECHO showed remarkable  improvement with LVEF of 45-50%. Wall motion abnormalities were seen in the proximal LAD artery territory as well as the apical septum.  He also has extensive peripheral arterial disease. Occluded left common carotid and moderate right internal carotid artery stenosis. Previous stent the left subclavian artery. Right iliac stent. Bilateral 60-99% renal artery stenosis    Past Medical History  Diagnosis Date  . Carotid stenosis   . Coronary artery disease     s/p CABG 2006; stenting to vein graft 05-2013; NSTEMI 06-2013 in setting of PEA arrest  . Subclavian artery stenosis, left     s/p stenting 2006  . Hypertension   . Hyperlipidemia   . Ischemic cardiomyopathy     echo 06/2013 EF 10-15% (normal 01/2013)  . At risk for sudden cardiac death, has lifevest at discharge 06/20/2013  . Shock liver 06/20/2013  . Acute MI, troponin > 20, no obvious culprit vessel 06/20/2013  . Hypothermia, induced, post arrest 06/20/2013  . Acute encephalopathy, improved 06/20/2013  . Dizziness 06/20/2013  . Weakness due to cardiac arrest 06/20/2013  . Fever, possible aspiration-treated with antibiotics 06/20/2013  . Tobacco abuse     >100 pack year history   . Dyslipidemia 10/09/2013    [place] Study atorvastatin -eIRB # [id number] tablet Take 40 mg by mouth daily.   Marland Kitchen PAD (peripheral artery disease) ([place]) 10/09/2013    Occluded left common carotid and moderate right internal carotid artery stenosis. Previous stent to the left subclavian artery. Right iliac stent. Bilateral 60-99% renal artery stenosis    . Heart failure, acute on chronic, systolic and diastolic ([place]) [id number]  . Anxiety   . GERD (gastroesophageal reflux disease)   . Squamous cell carcinoma of larynx ([place]) 07/14/2014    Past Surgical History  Procedure Laterality Date  . Angioplasty  05/2013    stent to SVG - LAD vein graft at [facility]  . Coronary artery bypass graft  09/2004    SVG to LAD (at [facility])  . Nm myocar perf wall motion  06/2008    persantine myoview - normal  pattern of systolic thickening/wall motion/perfusion;  . Lower extremity arterial doppler  08/2008    right and left ABI - mild arterial insufficiency at rest; R CIA/stent with 50-69% narrowing, L CIA with increased velocities (50-69% diameter reduction)  . Renal artery doppler  05/2008    right and left prox renal arteries with narrowing and increased velocities (60-99% diameter reduction)  . Iliac artery stent  07/24/2008    8x6 Smart nitinol self-expanding stent to origin of ilaci down into external iliac crossing the hypogastric artery (Dr. Adora Fridge)  . Subclavian angiogram Left 08/20/2004    8x69mm Genesis balloon mounted stent  . Left heart catheterization with coronary/graft angiogram N/A 06/15/2013    Procedure: LEFT HEART CATHETERIZATION WITH Beatrix Fetters;  Surgeon: Sinclair Grooms, MD;  Location: [facility] CATH LAB;  Service: Cardiovascular;  Laterality: N/A;  . Cardiac catheterization    . Direct laryngoscopy N/A 07/06/2014    Procedure: DIRECT LARYNGOSCOPY AND ESOPAGOSCOPY WITH BIOPSY;  Surgeon: [name], MD;  Location: [place];  Service: ENT;  Laterality: N/A;  . Laparotomy  N/A 08/05/2014    Procedure: EXPLORATORY LAPAROTOMY WITH SIGMOIDOTOMY WITH COLECTOMY & COLOSTOMY;  Surgeon: Coralie Keens, MD;  Location: [place];  Service: General;  Laterality: N/A;    Current Outpatient Prescriptions  Medication Sig Dispense Refill  . ALPRAZolam (XANAX) 0.5 MG tablet Place 1 tablet (0.5 mg total) into feeding tube 3 (three) times daily as needed for anxiety.  0  . aspirin (ASPIRIN CHILDRENS) 81 MG chewable tablet Place 1 tablet (81 mg total) into feeding tube daily. 30 tablet 0  . buPROPion (WELLBUTRIN) 100 MG tablet Place 1.5 tablets (150 mg total) into feeding tube 2 (two) times daily. 90 tablet 0  . carvedilol (COREG) 6.25 MG tablet Place 1 tablet (6.25 mg total) into feeding tube 2 (two) times daily. 90 tablet 3  . lidocaine-prilocaine (EMLA) cream Apply 1 application topically as  directed. Apply to affected area once 30 g 3  . Nutritional Supplements (FEEDING SUPPLEMENT, OSMOLITE 1.5 CAL,) LIQD Place 237 mLs into feeding tube every 3 (three) hours while awake. 180 Bottle 0  . Water For Irrigation, Sterile (FREE WATER) SOLN Place 240 mLs into feeding tube every 6 (six) hours.     No current facility-administered medications for this visit.    Allergies:   Cetuximab   Social History   Social History  . Marital Status: Married    Spouse Name: N/A  . Number of Children: 46  . Years of Education: 10   Occupational History  . carpenter    Social History Main Topics  . Smoking status: Former Smoker -- 4.00 packs/day for 45 years    Types: Cigarettes    Quit date: 06/09/2013  . Smokeless tobacco: Former Systems developer     Comment: tried for a few weeks  . Alcohol Use: No     Comment: 3-6 12 oz cans daily   . Drug Use: No  . Sexual Activity: No   Other Topics Concern  . None   Social History Narrative     Family History:  The patient's family history includes Cancer in his father; Heart Problems in his sister; Heart attack in his mother; Heart disease in his maternal aunt; Stroke in his mother and sister.   ROS:   Please see the history of present illness.    Review of Systems  Constitution: Positive for decreased appetite and weight loss. Negative for chills and fever.  HENT: Positive for hoarse voice and odynophagia.   Eyes: Negative for visual disturbance.  Cardiovascular: Negative for chest pain, claudication, dyspnea on exertion, irregular heartbeat, leg swelling, palpitations, paroxysmal nocturnal dyspnea and syncope.  Respiratory: Positive for cough. Negative for hemoptysis, sputum production and wheezing.   Endocrine: Negative for cold intolerance and heat intolerance.  Hematologic/Lymphatic: Negative for bleeding problem. Does not bruise/bleed easily.  Skin: Negative.   Musculoskeletal: Positive for muscle cramps and neck pain.  Gastrointestinal:  Positive for dysphagia.  Genitourinary: Negative.   Neurological: Negative.   Psychiatric/Behavioral: Negative.   Allergic/Immunologic: Negative.    All other systems reviewed and are negative.   PHYSICAL EXAM:   VS:  BP 106/74 mmHg  Pulse 82  Resp 16  Ht 5\' 7"  (1.702 m)  Wt 161 lb 8 oz (73.256 kg)  BMI 25.29 kg/m2   GEN: Well nourished, well developed, in no acute distress HEENT: normal Neck: no JVD, carotid bruits, or masses Cardiac: RRR; no murmurs, rubs, or gallops,no edema  Respiratory:  clear to auscultation bilaterally, normal work of breathing GI: soft, nontender, nondistended, + BS  MS: no deformity or atrophy Skin: warm and dry, no rash Neuro:  Alert and Oriented x 3, Strength and sensation are intact Psych: euthymic mood, full affect  Wt Readings from Last 3 Encounters:  01/03/15 161 lb 8 oz (73.256 kg)  01/01/15 160 lb 8 oz (72.802 kg)  11/27/14 149 lb 1.6 oz (67.631 kg)      Studies/Labs Reviewed:   EKG:  EKG is not ordered today.    Recent Labs: 08/05/2014: B Natriuretic Peptide 780.2* 10/20/2014: Magnesium 2.1 11/06/2014: ALT 34; BUN 16.7; Creatinine 0.9; HGB 11.6*; Platelets 266; Potassium 4.2; Sodium 139   Lipid Panel No results found for: CHOL, TRIG, HDL, CHOLHDL, VLDL, LDLCALC, LDLDIRECT  Additional studies/ records that were reviewed today include:  multiple records from hospitalizations over the summer and from the oncology clinic   ASSESSMENT:    1. Coronary artery disease involving native coronary artery of native heart without angina pectoris   2. Coronary artery disease involving autologous vein coronary bypass graft without angina pectoris   3. Chronic combined systolic and diastolic heart failure ([place])   4. Ischemic cardiomyopathy, EF 45%   5. Hyperlipidemia   6. Medication management   7. Squamous cell carcinoma of larynx (HCC)      PLAN:  In order of problems listed above: 1. CAD: No symptoms of coronary insufficiency at this  time 2. Most recent revascularization procedure was in May 2015 currently on aspirin monotherapy  3. CHF: clinically euvolemic, NYHA functional class I, not requiring diuretic therapy. Most recent LVEF was 50-55 percent by echocardiography, probably higher than average due to hyperadrenergic state.  he has symptomatic orthostatic hypotension and we will reduce his carvedilol dose back to 6.25 mg twice a day. There is no room for ACE inhibitor/ARB at this time.    4. No significant perfusion abnormalities and no reversible ischemia on nuclear stress test June 2016  5. Need to reevaluate his lipid profile after substantial weight loss and poor nutrition over the last few months. Currently he is not on statin therapy, but I suspect we will have to restart this.   edication Adjustments/Labs and Tests Ordered: Current medicines are reviewed at length with the patient today.  Concerns regarding medicines are outlined above.  Medication changes, Labs and Tests ordered today are listed below. Patient Instructions  A NEW RX HAS BEEN SENT TO YOUR PHARMACY FOR CARVEDILOL 6.25 MG TABLETS.  Your physician recommends that you return for lab work in: [place] [facility] AT [facility].  Dr. Sallyanne Kuster recommends that you schedule a follow-up appointment in: [place] THAT ACCEPT MEDICAID.      Mikael Spray, MD  01/05/2015 2:52 PM    [facility] [address], [place], [place]  [address] Phone: [phone]; Fax: [phone]

## 2015-01-23 ENCOUNTER — Other Ambulatory Visit: Payer: Self-pay | Admitting: Radiation Oncology

## 2015-01-23 DIAGNOSIS — C329 Malignant neoplasm of larynx, unspecified: Secondary | ICD-10-CM

## 2015-01-26 ENCOUNTER — Telehealth: Payer: Self-pay | Admitting: *Deleted

## 2015-01-26 NOTE — Telephone Encounter (Signed)
Called patient to inform of  CT for 01-29-15- arrival time - 7:45 am  @ [facility], spoke with patient and he is aware of this test

## 2015-01-29 ENCOUNTER — Ambulatory Visit (HOSPITAL_COMMUNITY)
Admission: RE | Admit: 2015-01-29 | Discharge: 2015-01-29 | Disposition: A | Discharge status: Home / Self Care | Payer: Medicaid Other | Source: Ambulatory Visit | Attending: Radiation Oncology | Admitting: Radiation Oncology

## 2015-01-29 ENCOUNTER — Encounter (HOSPITAL_COMMUNITY): Payer: Self-pay

## 2015-01-29 DIAGNOSIS — R1310 Dysphagia, unspecified: Secondary | ICD-10-CM | POA: Insufficient documentation

## 2015-01-29 DIAGNOSIS — Z923 Personal history of irradiation: Secondary | ICD-10-CM | POA: Diagnosis not present

## 2015-01-29 DIAGNOSIS — C329 Malignant neoplasm of larynx, unspecified: Secondary | ICD-10-CM | POA: Insufficient documentation

## 2015-01-29 DIAGNOSIS — R490 Dysphonia: Secondary | ICD-10-CM | POA: Insufficient documentation

## 2015-01-29 LAB — POCT I-STAT, CHEM 8
BUN: 11 mg/dL (ref 6–20)
CALCIUM ION: 1.24 mmol/L (ref 1.13–1.30)
CHLORIDE: 102 mmol/L (ref 101–111)
Creatinine, Ser: 1.1 mg/dL (ref 0.61–1.24)
GLUCOSE: 103 mg/dL — AB (ref 65–99)
HCT: 41 % (ref 39.0–52.0)
HEMOGLOBIN: 13.9 g/dL (ref 13.0–17.0)
Potassium: 4.1 mmol/L (ref 3.5–5.1)
Sodium: 140 mmol/L (ref 135–145)
TCO2: 27 mmol/L (ref 0–100)

## 2015-01-29 MED ORDER — IOHEXOL 300 MG/ML  SOLN
75.0000 mL | Freq: Once | INTRAMUSCULAR | Status: AC | PRN
  Administered 2015-01-29: 75 mL via INTRAVENOUS

## 2015-01-30 ENCOUNTER — Telehealth: Payer: Self-pay | Admitting: *Deleted

## 2015-01-30 ENCOUNTER — Other Ambulatory Visit: Payer: Self-pay | Admitting: Hematology and Oncology

## 2015-01-30 NOTE — Telephone Encounter (Signed)
  Oncology Nurse Navigator Documentation  Navigator Location: [facility] (01/30/15 1507) Navigator Encounter Type: Telephone (01/30/15 1507) Telephone: [name] Call;Appt Confirmation/Clarification (01/30/15 1507)           Treatment Phase: Post-Tx Follow-up (01/30/15 1507)       Spoke with Toniann Ket, she explained previously schedule PET was denied by [facility] but CT Neck approved.  Dr. Pablo Ledger aware and involved in approval discussion.  She understands, then, not to come tomorrow for a PET.  [name], RN, BSN, [place] at [place] [phone]                         Time Spent with Patient: 15 (01/30/15 1507)

## 2015-01-30 NOTE — Telephone Encounter (Signed)
  Oncology Nurse Navigator Documentation Navigator Location: [facility] (01/30/15 1327) Navigator Encounter Type: Telephone (01/30/15 1327) Telephone: Outgoing Call;Patient Update (01/30/15 1327)           Treatment Phase: Post-Tx Follow-up (01/30/15 1327)     Interventions: Coordination of Care (01/30/15 1327)            Acuity: Level 1 (01/30/15 1327)     Spoke with Mr Gwinner's m-in-law, tonie, to check on his well-being. She reported:  He had PEG removed this morning by Dr. [name] (general surgeon) who is following him for his colostomy.  He is eating/drinking at his baseline.  Sense of taste has returned for the most part.  Maintaining his weight at around 164 lbs.  Occasional issues with thickened saliva but this has pretty much resolved.  Contending with dry mouth, constantly drinking water to manage. I reviewed upcoming appts for this week, including Thursday's 8:30 f/u with Dr Pablo Ledger.  In response to her inquiry about tomorrow's PET, I determined it had been cancelled b/c not approved.  I told her I would investigate and follow-up with her.  I later LVMM with Toniann Ket, [place], to call me in this regard.  [name], RN, BSN, [place] at [place] [phone]          Time Spent with Patient: 15 (01/30/15 1327)

## 2015-01-31 ENCOUNTER — Other Ambulatory Visit (HOSPITAL_COMMUNITY): Payer: Medicaid Other

## 2015-02-01 ENCOUNTER — Ambulatory Visit
Admission: RE | Admit: 2015-02-01 | Discharge: 2015-02-01 | Disposition: A | Discharge status: Home / Self Care | Payer: Medicaid Other | Source: Ambulatory Visit | Attending: Radiation Oncology | Admitting: Radiation Oncology

## 2015-02-01 ENCOUNTER — Telehealth: Payer: Self-pay | Admitting: *Deleted

## 2015-02-01 ENCOUNTER — Encounter: Payer: Self-pay | Admitting: Radiation Oncology

## 2015-02-01 ENCOUNTER — Telehealth: Payer: Self-pay | Admitting: Nutrition

## 2015-02-01 VITALS — BP 134/72 | HR 67 | Temp 98.0°F | Resp 12 | Wt 161.9 lb

## 2015-02-01 DIAGNOSIS — C329 Malignant neoplasm of larynx, unspecified: Secondary | ICD-10-CM

## 2015-02-01 NOTE — Telephone Encounter (Signed)
Called patient to inform of PET Scan for 02-14-15 @ 8 am @ [facility], spoke with patient and he is aware of this test.

## 2015-02-01 NOTE — Progress Notes (Signed)
   Department of Radiation Oncology  Phone:  [phone] Fax:        [phone]   Name: [name] MRN: [id number]  DOB: [birth year]  Date: 02/01/2015  Follow Up Visit Note  Diagnosis: Squamous cell carcinoma of larynx [facility])   Staging form: Larynx - Supraglottis, AJCC 7th Edition     Clinical stage from 07/20/2014: Stage III (T3, N0, M0) - Signed by [name], MD on 08/01/2014  Summary and Interval since last radiation: 3 months. Completed 09/05/2014 - 10/23/2014 to the larynx with 70 Gy at 2 Gy for 35 fractions with at risk lymph nodes receiving 63 Gy at 1.8 Gy per fraction and 56 Gy at 1.6 Gy per fraction.  Interval History: [name] presents today for routine followup. He reports for a follow up today. He is now 3 months post radiation. Medicaid denies a PET scan so instead we had to order a CT scan of the neck. This was performed on 1/16 and showed an excellent response to treatment with a residual 1 cm mass in the left larynx. Edema was noted throughout the supraglottic and subglottic larynx. He is continuing to follow with speech therapy and has an appointment next week. He saw his surgeon next week and had his feeding tube removed. He is maintaining his weight and his sense of taste has mostly returned. 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 He can swallow liquids and small pieces of food. He has trouble with food larger than a marble. Overall, I think he is doing well.He is accompanied by his mother in law.   Physical Exam:  Filed Vitals:   02/01/15 0826  BP: 134/72  Pulse: 67  Temp: 98 F (36.7 C)  TempSrc: Oral  Resp: 12  Weight: 161 lb 14.4 oz (73.437 kg)  SpO2: 99%    IMPRESSION: [name] is a 60 y.o. male with T3 N0 supraglottic larynx cancer. Status post definitive radiation with resolving the acute effects of treatment but residual mass seen on recent CT scan.  PLAN: He will be scheduled for a PET scan in the next few weeks. I will follow up with him in 4 months. He has  an appointment with Dr. [name] in March to get a laryngoscopy. I encouraged him to attend head and neck FYNN class which will be starting soon. He will follow with general surgery to discuss colostomy reversal. I encouraged him to continue follow up with speech and also to continue his swallowing exercises.   ------------------------------------------------  [name], MD    This document serves as a record of services personally performed by [name], MD. It was created on her behalf by  Lendon Collar, a trained medical scribe. The creation of this record is based on the scribe's personal observations and the provider's statements to them. This document has been checked and approved by the attending provider.

## 2015-02-01 NOTE — Telephone Encounter (Addendum)
A user error has taken place: encounter opened in error, closed for administrative reasons.

## 2015-02-01 NOTE — Telephone Encounter (Signed)
Cancelled nutrition follow up.  States patient is doing great and weight is stable.  Feeding tube d/c.

## 2015-02-01 NOTE — Progress Notes (Signed)
Pain Status: He is currently in no pain. BP 134/72 mmHg  Pulse 67  Temp(Src) 98 F (36.7 C) (Oral)  Resp 12  Wt 161 lb 14.4 oz (73.437 kg)  SpO2 99%  Nutritional Status a) intake: PO Diet: soft diet, difficulty swallowing solids b) using a feeding tube?: 01/30/15 c) weight changes, if any:  Wt Readings from Last 3 Encounters:  02/01/15 161 lb 14.4 oz (73.437 kg)  01/03/15 161 lb 8 oz (73.256 kg)  01/01/15 160 lb 8 oz (72.802 kg)    Swallowing Status: Pt has had dysphagia for solids. Difficulty even swallowing pills  Smoking or chewing tobacco? no    Using fluoride trays daily? no   Dr. Valentino Saxon: 11/21/14  Next: 02/05/15- he is not going back.  He reports he is still doing the swallowing exercises. Dory Peru: 02/05/15  Appointment   PET scan cancelled Medicaid would not pay for it.

## 2015-02-03 ENCOUNTER — Telehealth: Payer: Self-pay | Admitting: Hematology and Oncology

## 2015-02-03 NOTE — Telephone Encounter (Signed)
Spoke with patient and he states this is not something he has time for right now as he does not want to miss anymore work.  Please mail him some information. thanks   The above message was sent to gretchen dawson regarding survivorship

## 2015-02-05 ENCOUNTER — Ambulatory Visit: Payer: Medicaid Other

## 2015-02-05 ENCOUNTER — Telehealth: Payer: Self-pay | Admitting: *Deleted

## 2015-02-05 ENCOUNTER — Ambulatory Visit: Payer: Self-pay

## 2015-02-05 ENCOUNTER — Encounter: Payer: Medicaid Other | Admitting: Nutrition

## 2015-02-05 NOTE — Telephone Encounter (Signed)
  Oncology Nurse Navigator Documentation Navigator Location: [facility] (02/05/15 1129) Navigator Encounter Type: Telephone (02/05/15 1129) Telephone: [place] Call (02/05/15 1129)           Treatment Phase: Post-Tx Follow-up (02/05/15 1129)     Interventions: Coordination of Care (02/05/15 1129)       Per Dr Calton Dach guidance, I called [facility] to arrange removal of [name]'s port by Dr. [name].  I spoke with RN Sunday Spillers who confirmed Dr. [name] is performing a colostomy reversal on 1/31.  I requested that per Dr. Alvy Bimler, his port-a-cath be removed at this time as well.  She indicated she would forward a note to Dr. [name] to this effect.  I provided Dr. Calton Dach and my phone # for follow-up if needed.  [name], RN, BSN, [place] at [place] [phone]                   Time Spent with Patient: 15 (02/05/15 1129)

## 2015-02-14 ENCOUNTER — Encounter (HOSPITAL_COMMUNITY)
Admission: RE | Admit: 2015-02-14 | Discharge: 2015-02-14 | Disposition: A | Discharge status: Home / Self Care | Payer: Medicaid Other | Source: Ambulatory Visit | Attending: Radiation Oncology | Admitting: Radiation Oncology

## 2015-02-14 DIAGNOSIS — C329 Malignant neoplasm of larynx, unspecified: Secondary | ICD-10-CM | POA: Diagnosis not present

## 2015-02-14 LAB — GLUCOSE, CAPILLARY: Glucose-Capillary: 100 mg/dL — ABNORMAL HIGH (ref 65–99)

## 2015-02-14 MED ORDER — FLUDEOXYGLUCOSE F - 18 (FDG) INJECTION
8.0000 | Freq: Once | INTRAVENOUS | Status: AC | PRN
  Administered 2015-02-14: 8 via INTRAVENOUS

## 2015-02-15 ENCOUNTER — Telehealth: Payer: Self-pay | Admitting: *Deleted

## 2015-02-15 NOTE — Telephone Encounter (Signed)
Thanks I am transitioning her care to [facility]

## 2015-02-15 NOTE — Telephone Encounter (Signed)
  Oncology Nurse Navigator Documentation  Navigator Location: [facility] (02/15/15 1102) Navigator Encounter Type: Telephone (02/15/15 1102) Telephone: Outgoing Call;Diagnostic Results (02/15/15 1102)           Treatment Phase: Post-Tx Follow-up (02/15/15 1102)       Per Dr Unknown Jim guidance, I called patient to inform him of favorable results, he expressed appreciation for information.  He informed also that colostomy reversal is going to be scheduled after his 03/05/15 colonoscopy.  He indicated Dr [name] will also be removing his port at that time.  [name], RN, BSN, [place] at [place] [phone]                         Time Spent with Patient: 15 (02/15/15 1102)

## 2015-02-15 NOTE — Telephone Encounter (Signed)
  Oncology Nurse Navigator Documentation  Navigator Location: [facility] (02/15/15 1359) Navigator Encounter Type: Telephone (02/15/15 1359) Telephone: Outgoing Call (02/15/15 1359)                 Interventions: Coordination of Care (02/15/15 1359)      Per Dr. Unknown Jim guidance, called [facility] to arrange follow-up visit for Mr Nitzel.  Spoke with Kalman Shan, requested that patient be contacted and appt arranged to see Dr. [name] ca mid-March.   She verbalized understanding.  [name], RN, BSN, [place] at [place] [phone]                    Time Spent with Patient: 15 (02/15/15 1359)

## 2015-03-07 ENCOUNTER — Telehealth: Payer: Self-pay | Admitting: *Deleted

## 2015-03-07 NOTE — Telephone Encounter (Signed)
  Oncology Nurse Navigator Documentation  Navigator Location: [facility] (03/07/15 1434) Navigator Encounter Type: Telephone (03/07/15 1434) Telephone: [place] Cro[place] Call;Patient Update (03/07/15 1434)         Called [name] for update on his colostomy reversal and PAC removal which was pending completion of a colonoscopy.  He reported colonoscopy was conducted this week Monday, results favorable.  He is waiting to hear from Dr. Trevor Mace office for date to have colostomy reversal/PAC removal, agreed to notify me when scheduled.  [name], RN, BSN, [name] at [place] [phone]                                   Time Spent with Patient: 15 (03/07/15 1434)

## 2015-03-15 ENCOUNTER — Telehealth: Payer: Self-pay | Admitting: *Deleted

## 2015-03-15 NOTE — Telephone Encounter (Signed)
  Oncology Nurse Navigator Documentation  Navigator Location: [facility] (03/15/15 1023) Navigator Encounter Type: Telephone (03/15/15 1023) Telephone: Outgoing Call (03/15/15 1023)       Returned patient's caregiver Tonie's VMM in which she requested name of his ENT.  I provided Dr. Janeice Robinson name, noted that I called his office on 02/15/15 per Dr. Pablo Ledger to arrange an appt in March.  She thanked me for the information.  [name], RN, BSN, [place] at [place] [phone]                                    Time Spent with Patient: 15 (03/15/15 1023)

## 2015-03-16 ENCOUNTER — Other Ambulatory Visit: Payer: Self-pay | Admitting: Surgery

## 2015-04-04 ENCOUNTER — Telehealth: Payer: Self-pay | Admitting: *Deleted

## 2015-04-04 NOTE — Telephone Encounter (Signed)
  Oncology Nurse Navigator Documentation  Navigator Location: [facility] (04/04/15 1630) Navigator Encounter Type: Telephone (04/04/15 1630) Telephone: Incoming Call;Patient Update (04/04/15 1630)       Received VMM 03/29/15 from Mr. Maietta's caregiver, Vivien Rota.  She informed his colostomy reversal and port-a-cath removal are scheduled for 04/25/15.  [name], RN, BSN, [place] [facility] [phone]                                     Time Spent with Patient: 15 (04/04/15 1630)

## 2015-04-05 ENCOUNTER — Ambulatory Visit: Payer: Medicaid Other | Admitting: Cardiovascular Disease

## 2015-04-05 ENCOUNTER — Encounter: Payer: Self-pay | Admitting: Cardiovascular Disease

## 2015-04-05 ENCOUNTER — Ambulatory Visit (INDEPENDENT_AMBULATORY_CARE_PROVIDER_SITE_OTHER): Payer: Medicaid Other | Admitting: Cardiovascular Disease

## 2015-04-05 VITALS — BP 118/58 | HR 64 | Ht 67.0 in | Wt 165.0 lb

## 2015-04-05 DIAGNOSIS — I2510 Atherosclerotic heart disease of native coronary artery without angina pectoris: Secondary | ICD-10-CM | POA: Diagnosis not present

## 2015-04-05 DIAGNOSIS — Z923 Personal history of irradiation: Secondary | ICD-10-CM | POA: Insufficient documentation

## 2015-04-05 DIAGNOSIS — I5042 Chronic combined systolic (congestive) and diastolic (congestive) heart failure: Secondary | ICD-10-CM | POA: Diagnosis not present

## 2015-04-05 DIAGNOSIS — E785 Hyperlipidemia, unspecified: Secondary | ICD-10-CM

## 2015-04-05 DIAGNOSIS — I25810 Atherosclerosis of coronary artery bypass graft(s) without angina pectoris: Secondary | ICD-10-CM | POA: Diagnosis not present

## 2015-04-05 DIAGNOSIS — Z01810 Encounter for preprocedural cardiovascular examination: Secondary | ICD-10-CM

## 2015-04-05 DIAGNOSIS — Z79899 Other long term (current) drug therapy: Secondary | ICD-10-CM

## 2015-04-05 NOTE — Progress Notes (Signed)
Patient ID: [name], male   DOB: 04/09/1955, 60 y.o.   MRN: [id number]    Cardiology Office Note    Date:  04/05/2015   ID:  [name], DOB 03-10-1955, MRN [id number]  PCP:  Imelda Pillow, NP  Cardiologist:   Sanda Klein, MD   Chief Complaint  Patient presents with  . 3 MONTH FOLLOW UP    Patient has no complaints.    History of Present Illness:  [name] is a 60 y.o. 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   He denies angina and dyspnea with usual exertion. He is at work full time. He denies dizziness and syncope, palpitations, edema or claudication. He still has swallowing difficulties and has a gastrostomy tube. He can eat food if he chews very thoroughly.  Additional problems include treated hypertension and hyperlipidemia and peripheral artery disease status post stent of the left subclavian artery in 2006, occluded left common carotid artery, moderate right internal carotid artery stenosis, history of right iliac artery stent, bilateral moderate to severe renal artery stenosis. Used to be a heavy smoker with more than 100-pack-year history and has had problems with prolonged intubation following surgery. Has a colostomy following diverticulitis and colonic perforation with plan for takedown with Dr. Philipp Ovens April 2017 and in Oct 2016 completed chemo and XRT for squamous cell carcinoma of the larynx.  Last echo 08/07/2014  - Left ventricle: The cavity size was normal. Wall thickness was  normal. Systolic function was normal. The estimated ejection  fraction was in the range of 50% to 55%. There is hypokinesis of  the  mid-apicalanteroseptal myocardium  Last nuclear study 07/04/2014 - low risk, no ischemia. Not gated.  Cath history  - CAD S/P CABG in '06 (SVG-LAD).  - January 2015 acute MI, proximal circumflex artery drug-eluting 2.5x16 mm Xience stent at [facility]  - May 2015 he went to his PCP for shoulder pain and his Troponin was elevated. He had stenosis in the SVG-LAD and [facility] He was discharged on Plavix.  - 06/09/13 he had VF arrest, shock, respiratory failure, shock liver, and acute renal insufficiency. Cath revealed widely patent saphenous vein graft to the mid LAD (stented with no evidence of thrombus or restenosis). Widely patent proximal to mid circumflex stent, with haziness in the circumflex proximal to the stent but no significant obstruction. Mild to moderate distal left main stenosis (30-40%) with 80-90% ostial LAD, threatening a small to moderate diagonal territory in the distribution of the wall motion abnormality. The mid LAD off the retrograde segment from the graft insertion site was occluded. Widely patent RCA. A Myoview showed no ischemia. Echo showed left ventricular ejection fraction of 10-15%. His P2Y12 was elevated and he was taken off Plavix and put on Brilinita. Discharged 06/20/13 with a Life Vest.  - 09/27/13 ECHO showed remarkable improvement with LVEF of 45-50%. Wall motion abnormalities were seen in the proximal LAD artery territory as well as the apical septum. EF 50-55% July 2016. No ischemia on nuke June 2016.  He also has extensive peripheral arterial disease. Occluded left common carotid and moderate right internal carotid artery stenosis. Previous stent the left subclavian artery. Right iliac stent.  Bilateral 60-99% renal artery stenosis    Past Medical History  Diagnosis Date  . Carotid stenosis   . Coronary artery disease     s/p CABG 2006; stenting to vein graft 05-2013; NSTEMI 06-2013 in setting of PEA arrest  . Subclavian artery stenosis,  left     s/p stenting 2006  . Hypertension   . Hyperlipidemia   . Ischemic cardiomyopathy     echo 06/2013 EF 10-15% (normal 01/2013)  . At risk for sudden cardiac death, has lifevest at discharge 06/20/2013  . Shock liver 06/20/2013  . Acute MI, troponin > 20, no obvious culprit vessel 06/20/2013  . Hypothermia, induced, post arrest 06/20/2013  . Acute encephalopathy, improved 06/20/2013  . Dizziness 06/20/2013  . Weakness due to cardiac arrest 06/20/2013  . Fever, possible aspiration-treated with antibiotics 06/20/2013  . Tobacco abuse     >100 pack year history   . Dyslipidemia 10/09/2013    [place] Study atorvastatin -eIRB # [id number] tablet Take 40 mg by mouth daily.   Marland Kitchen PAD (peripheral artery disease) ([place]) 10/09/2013    Occluded left common carotid and moderate right internal carotid artery stenosis. Previous stent to the left subclavian artery. Right iliac stent. Bilateral 60-99% renal artery stenosis    . Heart failure, acute on chronic, systolic and diastolic ([place]) [id number]  . Anxiety   . GERD (gastroesophageal reflux disease)   . Squamous cell carcinoma of larynx ([place]) 07/14/2014    Past Surgical History  Procedure Laterality Date  . Angioplasty  05/2013    stent to SVG - LAD vein graft at [facility]  . Coronary artery bypass graft  09/2004    SVG to LAD (at [facility])  . Nm myocar perf wall motion  06/2008    persantine myoview - normal pattern of systolic thickening/wall motion/perfusion;  . Lower extremity arterial doppler  08/2008    right and left ABI - mild arterial insufficiency at rest; R CIA/stent with 50-69% narrowing, L CIA with increased velocities (50-69% diameter reduction)  . Renal artery doppler  05/2008    right and left prox renal arteries with narrowing and increased velocities (60-99% diameter reduction)  . Iliac artery stent  07/24/2008    8x6 Smart nitinol self-expanding stent to origin of ilaci down into external iliac crossing the hypogastric artery (Dr. Adora Fridge)  . Subclavian  angiogram Left 08/20/2004    8x78mm Genesis balloon mounted stent  . Left heart catheterization with coronary/graft angiogram N/A 06/15/2013    Procedure: LEFT HEART CATHETERIZATION WITH Beatrix Fetters;  Surgeon: Sinclair Grooms, MD;  Location: [facility] CATH LAB;  Service: Cardiovascular;  Laterality: N/A;  . Cardiac catheterization    . Direct laryngoscopy N/A 07/06/2014    Procedure: DIRECT LARYNGOSCOPY AND ESOPAGOSCOPY WITH BIOPSY;  Surgeon: [name], MD;  Location: [place];  Service: ENT;  Laterality: N/A;  . Laparotomy N/A 08/05/2014    Procedure: EXPLORATORY LAPAROTOMY WITH SIGMOIDOTOMY WITH COLECTOMY & COLOSTOMY;  Surgeon: Coralie Keens, MD;  Location: [place];  Service: General;  Laterality: N/A;    Outpatient Prescriptions Prior to Visit  Medication Sig Dispense Refill  . ALPRAZolam (XANAX) 0.5 MG tablet Place 1 tablet (0.5 mg total) into feeding tube 3 (three) times daily as needed for anxiety.  0  . aspirin (ASPIRIN CHILDRENS) 81 MG chewable tablet Place 1 tablet (81 mg total) into feeding tube daily. 30 tablet 0  . buPROPion (WELLBUTRIN) 100 MG tablet Place 1.5 tablets (150 mg total) into feeding tube 2 (two)  times daily. 90 tablet 0  . carvedilol (COREG) 6.25 MG tablet Place 1 tablet (6.25 mg total) into feeding tube 2 (two) times daily. 90 tablet 3  . Water For Irrigation, Sterile (FREE WATER) SOLN Place 240 mLs into feeding tube every 6 (six) hours.    . lidocaine-prilocaine (EMLA) cream Apply 1 application topically as directed. Apply to affected area once 30 g 3  . Nutritional Supplements (FEEDING SUPPLEMENT, OSMOLITE 1.5 CAL,) LIQD Place 237 mLs into feeding tube every 3 (three) hours while awake. (Patient not taking: Reported on 02/01/2015) 180 Bottle 0   No facility-administered medications prior to visit.     Allergies:   Cetuximab   Social History   Social History  . Marital Status: Married    Spouse Name: N/A  . Number of Children: 62  . Years of Education: 10     Occupational History  . carpenter    Social History Main Topics  . Smoking status: Former Smoker -- 4.00 packs/day for 45 years    Types: Cigarettes    Quit date: 06/09/2013  . Smokeless tobacco: Former Systems developer     Comment: tried for a few weeks  . Alcohol Use: No     Comment: 3-6 12 oz cans daily   . Drug Use: No  . Sexual Activity: No   Other Topics Concern  . None   Social History Narrative     Family History:  The patient's family history includes Cancer in his father; Heart Problems in his sister; Heart attack in his mother; Heart disease in his maternal aunt; Stroke in his mother and sister.   ROS:   Please see the history of present illness.    ROS All other systems reviewed and are negative.   PHYSICAL EXAM:   VS:  BP 118/58 mmHg  Pulse 64  Ht 5\' 7"  (1.702 m)  Wt 74.844 kg (165 lb)  BMI 25.84 kg/m2   GEN: Well nourished, well developed, in no acute distress HEENT: normal Neck: no JVD, carotid bruits, or masses Cardiac: RRR; no murmurs, rubs, or gallops,no edema  Respiratory:  clear to auscultation bilaterally, normal work of breathing, Portacath R subclavian GI: soft, nontender, nondistended, + BS MS: no deformity or atrophy Skin: warm and dry, no rash Neuro:  Alert and Oriented x 3, Strength and sensation are intact Psych: euthymic mood, full affect  Wt Readings from Last 3 Encounters:  04/05/15 74.844 kg (165 lb)  02/01/15 73.437 kg (161 lb 14.4 oz)  01/03/15 73.256 kg (161 lb 8 oz)      Studies/Labs Reviewed:   EKG:  EKG is not ordered today.   Recent Labs: 08/05/2014: B Natriuretic Peptide 780.2* 10/20/2014: Magnesium 2.1 11/06/2014: ALT 34; Platelets 266 01/29/2015: BUN 11; Creatinine, Ser 1.10; Hemoglobin 13.9; Potassium 4.1; Sodium 140    ASSESSMENT:    1. Chronic combined systolic and diastolic heart failure ([place])   2. CAD in native artery   3. Coronary artery disease involving autologous vein coronary bypass graft without angina  pectoris   4. Dyslipidemia   5. Preoperative cardiovascular examination   6. Medication management      PLAN:  In order of problems listed above:  1. CHF: appears to be euvolemic, NYHA functional class II. No changes made to current medications. He does not require diuretic therapy. His diastolic blood pressure is relatively low and there does not appear to be room for RAAS inhibitors. Since his EF is minimally depressed, these agents are not immediately  necessary. 2. CAD: he does not have angina pectoris. A low risk nuclear stress test within the last 9 months. 3. S/p SVG acute stent thrombosis May 2015: at this point he is no longer on antiplatelet agents other than aspirin and we should be well beyond the typical range for in-stent restenosis. 4. HLP:  Recheck his lipid profile. He has not been receiving statins since he lost so much weight and had so many serious noncardiac health problems in the last year. 5. Preop risk evaluation: simply by virtue of the extent of his previous cardiac problems, Mordechai's risk for major cardiovascular complications is low to moderate, however I believe that at this point he is optimized hemodynamically and there is no need for additional testing or treatment before the planned colostomy takedown surgery.    Medication Adjustments/Labs and Tests Ordered: Current medicines are reviewed at length with the patient today.  Concerns regarding medicines are outlined above.  Medication changes, Labs and Tests ordered today are listed in the Patient Instructions below. There are no Patient Instructions on file for this visit.     Mikael Spray, MD  04/05/2015 8:32 AM    [facility] [address], [place], [place]  [address] Phone: [phone]; Fax: [phone]

## 2015-04-05 NOTE — Patient Instructions (Signed)
Your physician recommends that you return for lab work at your earliest [place].  Dr Sallyanne Kuster recommends that you schedule a follow-up appointment in 6 months. You will receive a reminder letter in the mail two months in advance. If you don't receive a letter, please call our office to schedule the follow-up appointment.  If you need a refill on your cardiac medications before your next appointment, please call your pharmacy.

## 2015-04-16 NOTE — Pre-Procedure Instructions (Signed)
[name]  04/16/2015     Your procedure is scheduled on : Wednesday April 25, 2015 at 9:45 AM.  Report to [facility] Admitting at 7:45 AM.  Call this number if you have problems the morning of surgery: [phone]    Remember:  Do not eat food or drink liquids after midnight. Please follow your surgerons bowel prep orders.   Take these medicines the morning of surgery with a SIP OF WATER:  Alprazolam (Xanax) if needed, Bupropion (Wellbutrin), Carvedilol (Coreg)   Stop taking any aspirin, vitamins, herbal medications/supplements, NSAIDs, Ibuprofen, Advil, Motrin, Aleve,etc on Friday April 7th   Do not wear jewelry.  Do not wear lotions, powders, or cologne.    Men may shave face and neck.  Do not bring valuables to the hospital.  [facility] is not responsible for any belongings or valuables.  Contacts, dentures or bridgework may not be worn into surgery.  Leave your suitcase in the car.  After surgery it may be brought to your room.  For patients admitted to the hospital, discharge time will be determined by your treatment team.  Patients discharged the day of surgery will not be allowed to drive home.   Name and phone number of your driver:    Special instructions:  Shower using CHG soap the night before and the morning of your surgery  Please read over the following fact sheets that you were given. Pain Booklet, Coughing and Deep Breathing and Surgical Site Infection Prevention

## 2015-04-17 ENCOUNTER — Encounter (HOSPITAL_COMMUNITY): Payer: Self-pay

## 2015-04-17 ENCOUNTER — Encounter (HOSPITAL_COMMUNITY)
Admission: RE | Admit: 2015-04-17 | Discharge: 2015-04-17 | Disposition: A | Discharge status: Home / Self Care | Payer: Medicaid Other | Source: Ambulatory Visit | Attending: Surgery | Admitting: Surgery

## 2015-04-17 ENCOUNTER — Encounter (HOSPITAL_COMMUNITY): Payer: Self-pay | Admitting: Vascular Surgery

## 2015-04-17 ENCOUNTER — Other Ambulatory Visit: Payer: Self-pay | Admitting: Surgery

## 2015-04-17 DIAGNOSIS — Z01812 Encounter for preprocedural laboratory examination: Secondary | ICD-10-CM | POA: Insufficient documentation

## 2015-04-17 HISTORY — DX: Depression, unspecified: F32.A

## 2015-04-17 HISTORY — DX: Dysphagia, unspecified: R13.10

## 2015-04-17 HISTORY — DX: Major depressive disorder, single episode, unspecified: F32.9

## 2015-04-17 HISTORY — DX: Unspecified osteoarthritis, unspecified site: M19.90

## 2015-04-17 HISTORY — DX: Reserved for inherently not codable concepts without codable children: IMO0001

## 2015-04-17 LAB — CBC
HCT: 39 % (ref 39.0–52.0)
HEMOGLOBIN: 12.7 g/dL — AB (ref 13.0–17.0)
MCH: 30.8 pg (ref 26.0–34.0)
MCHC: 32.6 g/dL (ref 30.0–36.0)
MCV: 94.7 fL (ref 78.0–100.0)
PLATELETS: 254 10*3/uL (ref 150–400)
RBC: 4.12 MIL/uL — AB (ref 4.22–5.81)
RDW: 14 % (ref 11.5–15.5)
WBC: 6.9 10*3/uL (ref 4.0–10.5)

## 2015-04-17 LAB — COMPREHENSIVE METABOLIC PANEL
ALT: 18 U/L (ref 17–63)
AST: 23 U/L (ref 15–41)
Albumin: 3.8 g/dL (ref 3.5–5.0)
Alkaline Phosphatase: 93 U/L (ref 38–126)
Anion gap: 9 (ref 5–15)
BUN: 10 mg/dL (ref 6–20)
CHLORIDE: 104 mmol/L (ref 101–111)
CO2: 25 mmol/L (ref 22–32)
CREATININE: 1.03 mg/dL (ref 0.61–1.24)
Calcium: 9.4 mg/dL (ref 8.9–10.3)
GFR calc Af Amer: >60 mL/min (ref >60)
Glucose, Bld: 103 mg/dL — ABNORMAL HIGH (ref 65–99)
Potassium: 4 mmol/L (ref 3.5–5.1)
Sodium: 138 mmol/L (ref 135–145)
Total Bilirubin: 1 mg/dL (ref 0.3–1.2)
Total Protein: 7.3 g/dL (ref 6.5–8.1)

## 2015-04-17 NOTE — Progress Notes (Signed)
   04/17/15 1225  OBSTRUCTIVE SLEEP APNEA  Have you ever been diagnosed with sleep apnea through a sleep study? No  Do you snore loudly (loud enough to be heard through closed doors)?  1  Do you often feel tired, fatigued, or sleepy during the daytime (such as falling asleep during driving or talking to someone)? 0  Has anyone observed you stop breathing during your sleep? 1  Do you have, or are you being treated for high blood pressure? 1  BMI more than 35 kg/m2? 0  Age > 50 (1-yes) 1  Neck circumference greater than:Male 16 inches or larger, Male 17inches or larger? 0  Male Gender (Yes=1) 1  Obstructive Sleep Apnea Score 5   This patient has screened at risk for sleep apnea using the STOP Bang tool used during a pre-surgical visit. A score of 4 or greater is at risk for sleep apnea.

## 2015-04-17 NOTE — Progress Notes (Signed)
PCP is [name], NP  Cardiologist is Engineer, building services  Patient denied having any acute cardiac or pulmonary issues  Patient informed Nurse that he has difficulty swallowing solid foods and pills due to having throat cancer last year, and having radiation. Patient stated "I have to chew my food really good, and I have to dissolve all my medications in water and then swallow them down. Anything bigger than a baby aspirin I can't swallow, and a baby aspirin is pushing it...Marland KitchenMarland Kitchenthey told me it may take a year before the swelling in my throat goes down."  Nurse called Ebony Hail, [place] and informed her of this.   After patient signed his consent form, patient informed Nurse that Dr. [name] was also supposed to be removing his port a cath, and perform a hernia repair. Nurse called Dr. Trevor Mace office and requested to speak with Dr. Trevor Mace Nurse, however the Nurse was at lunch, and receptionist stated Nurse would return call. Direct call back number left.

## 2015-04-17 NOTE — Progress Notes (Addendum)
Anesthesia PAT Evaluation: [name] is a 60 year old male scheduled for colostomy takedown on 04/25/15 by Dr. Coralie Keens. ([name] also thinks he is suppose to undergo Port-a-cath removal and hernia repair at that same time. Dr. Trevor Mace office notified, and staff to clarify with surgeon and update consent as indicated.)  History includes CAD s/p CABG (SVG-LAD) 09/2004, NSTEMI 01/19/13 s/p DES CX [facility]), NSTEMI 06/01/13 (severe mid SVG-LAD stenosis) s/p DES to LAD [id number] (complicated by thrombus embolization to mid SVG s/p retrieval) [facility]), VF cardiac arrest 06/09/13 (unwitnessed; received at least 15 min CPR and Epi X 3, hypothermia protocol, hospital course complicated by cardiogenic shock, liver shock, respiratory failure, and ARI; p2y12 was elevated and Plavix changed to Brilinta; hospitalized thru 06/20/13), ischemic cardiomyopathy (required life vest 06/2013- 09/2013), chronic combined systolic and diastolic CHF, HTN, HLD, PAD with left SCA stent '06 and right iliac artery stent '10, moderate to severe renal artery stenosis, carotid artery stenosis (occluded LCCA, moderate RICA stenosis '06), perforated sigmoid diverticulitis s/p exploratory laparotomy with sigmoid colectomy and colostomy 08/05/14, 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 OSA screening score was 5. PCP is Dr. [name]. RAD-ONC is Dr. [name].  Cardiologist is Dr. Sanda Klein, last visit 04/05/15 for follow-up and pre-operative clearance. His note states, "Preop risk evaluation: simply by virtue of the extent of his previous cardiac problems, Steward's risk for major cardiovascular complications is low to moderate, however I believe that at this point he is optimized hemodynamically and there is no  need for additional testing or treatment before the planned colostomy takedown surgery."  ENT is Dr. [name], last visit 04/05/15. He noted that indirect exam of the larynx revealed mobile cords with diffuse supraglottic edema. [name] reported at PAT that his anterior neck always feels tight since undergoing radiation. He reports that Dr. [name] said this could take 1-2 years to improve. He sleeps on his side for comfort. His voice is hoarse. He has issues with dysphagia (as discussed above).  Meds include Xanax, ASA 81 mg, Wellbutrin, Coreg.  01/03/15 EKG: NSR, septal infarct (age undetermined), T wave abnormality, consider anterior ischemia.  08/07/14 Echo: Study Conclusions - Left ventricle: The cavity size was normal. Wall thickness was normal. Systolic function was normal. The estimated ejection fraction was in the range of 50% to 55%. There is hypokinesis of the mid-apicalanteroseptal myocardium. Left ventricular diastolic function parameters were normal. - Mild tricuspid regurgitation. (Comparison EF: 45-50% 09/27/13, 10-15% 06/10/13.)  07/04/14 Nuclear stress test: 1. There was no ST segment deviation noted during stress. Normal myocardial perfusion (2 day protocol). Gating not accurate secondary to technical difficulties.  06/15/13 Cardiac Cath (Dr. Daneen Schick): IMPRESSIONS:  1. Widely patent saphenous vein graft to the mid LAD. This vessel is stented and there is no evidence of ISR. 2. Widely patent proximal to mid circumflex stent. There is haziness in the circumflex proximal to the stent but no significant obstruction. 3. Mild to moderate distal left main stenosis in the 30-40% range. 4. 80-90% ostial LAD threatening a small to moderate diagonal territory in the distribution of the wall motion abnormality. The mid LAD off the retrograde segment from the graft insertion site is also occluded. 5. Widely patent RCA. 6. LVEF 40%. Basal to mid anterior wall moderate  hypokinesis. RECOMMENDATION: The [name]  is a fixed basal to mid anterior wall motion abnormality. This is in the distribution of proximal LAD and diagonal. This raises the question of whether or not the ostial LAD/diagonal could be a source of ischemia that cause arrhythmia. Intervention in this area would be difficult due to heavy calcification and will involve the left main which was then threatened the circumflex. If recurrent angina, the only area that would explain this symptom at this time would be the ostial LAD diagonal territory. At some point consideration of a myocardial perfusion study to look for ischemia/problem myocardium in that region would be reasonable. Consideration of AICD implantation.  02/14/15 Post-radiation PET scan: IMPRESSION: Mild irregularity of the left supraglottic larynx, likely reflecting radiation changes. No findings specific for residual tumor or metastatic disease.  01/29/15 CT soft tissue neck: IMPRESSION: 1. Pharynx and larynx: There is new, moderate diffuse edema involving the larynx related to interval radiation therapy. This is greatest in the supraglottic region although slightly extends into the subglottic larynx. The epiglottis is edematous. Changes of interval radiation therapy. Residual, asymmetric left supraglottic enhancement measures 1 cm in thickness, greatly improved from the mass present on the pretreatment study. 2. No enlarged cervical lymph nodes. 3. Vascular: Prominent vascular atherosclerosis the again noted with incompletely evaluated proximal right ICA stenosis. Chronically occluded left common and left cervical internal carotid arteries with partially visualized reconstitution of the ICA intracranially. Left subclavian artery stent.  I don't see any designated carotid studies since 2006. [name] cannot recall when they were last evaluated. CT of the neck only incompletely imaged his RICA.   10/14/14 CXR (in the setting of VDRF): IMPRESSION: 1.  Support apparatus appears in good position. 2. Small bilateral pleural effusions.  Preoperative labs noted. Glucose 103. Cr 1.03. H/H 12.7/39.0.  Last ENT records reviewed with anesthesiologist Dr. Conrad Bland including supraglottic and epiglottic edema noted on post-radiation CT and PET scans. Glidescope used 08/05/14. [name] has undergone neck radiation since. Definitive anesthesia plan following anesthesiologist evaluation on the day of surgery. He does have good mouth opening on exam. I also reviewed carotid stenosis history with anesthesiologist Dr. Lissa Hoard. With known left CCA occlusion and moderate RICA stenosis in 2006 and now s/p neck radiation would recommend getting a pre-operative carotid duplex. [name] is concerned about stroke risk, so would like to proceed with further evaluation as well. I have discussed with [facility] triage nurse Ovid Curd. He will discuss with Dr. Sallyanne Kuster for recommendations.  [facility] Short Stay Center/Anesthesiology Phone [phone] 04/18/2015 11:15 AM  Addendum: Carotid duplex done on 04/20/15. I don't see final report yet, but telephone communication in [place] indicated that he has chronic left carotid occlusion but new findings of significant RICA stenosis (velocity 314/1015). He will not be given clearance for general surgery until he has been seen by vascular surgery and their recommendations discussed. Dr. Victorino December office has notified Raquel Sarna at the surgeon's office.  [facility] Short Stay Center/Anesthesiology Phone [phone] 04/23/2015 12:02 PM

## 2015-04-17 NOTE — Progress Notes (Signed)
Dr. Trevor Mace office staff returned Nurse's call. Nurse informed Dr. Trevor Mace Nurse of patient having difficulty swallowing prescribed antibiotics, patient stating he was supposed to have a hernia repair and port a cath removal during this surgery (which was not listed on consent form), and Nurse's concern about Entereg that was ordered for DOS. Dr. Trevor Mace Nurse stated she would inform Dr. [name] of this, and that she would call the patient and inform him of Dr. Trevor Mace plans.

## 2015-04-18 ENCOUNTER — Telehealth: Payer: Self-pay | Admitting: Cardiovascular Disease

## 2015-04-18 DIAGNOSIS — I6523 Occlusion and stenosis of bilateral carotid arteries: Secondary | ICD-10-CM

## 2015-04-18 LAB — HEMOGLOBIN A1C
HEMOGLOBIN A1C: 5.7 % — AB (ref 4.8–5.6)
MEAN PLASMA GLUCOSE: 117 mg/dL

## 2015-04-18 NOTE — Telephone Encounter (Signed)
Received call from [facility], anesthesia PA. She notes pt is scheduled on 4/12 for colostomy takedown and cleared by Dr. Loletha Grayer. Reviewed that he has known carotid stenosis, and wanted to make sure this was followed - notes the anesthesiologist was concerned since colostomy takedown technically elective procedure - asking if we can do carotid US before his procedure. They had raised this concern w/ patient and he noted he would prefer his carotid stenosis to be re-evaluated if needed.  Will review w/ Dr. Sallyanne Kuster.

## 2015-04-18 NOTE — Telephone Encounter (Signed)
Okay, I don't think it's urgent but please schedule for carotid ultrasound as soon as possible

## 2015-04-18 NOTE — Telephone Encounter (Signed)
Noted, returned call to [facility] to inform of recommendation & that order is entered. Called pt, informed of recommendations & that scheduler will call to set up.

## 2015-04-20 ENCOUNTER — Ambulatory Visit (HOSPITAL_COMMUNITY)
Admission: RE | Admit: 2015-04-20 | Discharge: 2015-04-20 | Disposition: A | Discharge status: Home / Self Care | Payer: Medicaid Other | Source: Ambulatory Visit | Attending: Internal Medicine | Admitting: Internal Medicine

## 2015-04-20 DIAGNOSIS — I255 Ischemic cardiomyopathy: Secondary | ICD-10-CM | POA: Insufficient documentation

## 2015-04-20 DIAGNOSIS — I5043 Acute on chronic combined systolic (congestive) and diastolic (congestive) heart failure: Secondary | ICD-10-CM | POA: Diagnosis not present

## 2015-04-20 DIAGNOSIS — E785 Hyperlipidemia, unspecified: Secondary | ICD-10-CM | POA: Diagnosis not present

## 2015-04-20 DIAGNOSIS — I110 Hypertensive heart disease with heart failure: Secondary | ICD-10-CM | POA: Diagnosis not present

## 2015-04-20 DIAGNOSIS — K219 Gastro-esophageal reflux disease without esophagitis: Secondary | ICD-10-CM | POA: Insufficient documentation

## 2015-04-20 DIAGNOSIS — I6523 Occlusion and stenosis of bilateral carotid arteries: Secondary | ICD-10-CM | POA: Diagnosis present

## 2015-04-23 ENCOUNTER — Telehealth: Payer: Self-pay | Admitting: Cardiovascular Disease

## 2015-04-23 DIAGNOSIS — I6523 Occlusion and stenosis of bilateral carotid arteries: Secondary | ICD-10-CM

## 2015-04-23 NOTE — Telephone Encounter (Signed)
New message      Calling to get carotid doppler results and to find out what to do next

## 2015-04-23 NOTE — Telephone Encounter (Signed)
Pt anxious for results on carotid US performed Fri - this is related to clearance for a colostomy reversal procedure scheduled Wed  Aware results still in process & I am sending request to Dr. Sallyanne Kuster for review.

## 2015-04-23 NOTE — Telephone Encounter (Signed)
Spoke with patient and communicated preliminary carotid doppler results provided to Dr. Debara Pickett in Dr. Lurline Del absence. See below:   FW: prelim Carotid  Received: 3 days ago    Pixie Casino, MD  Fidel Levy, RN Cc: Sanda Klein, MD           Refer to vascular surgery next week. Please contact surgeon to advise patient is not cleared for surgery until he is seen by vascular surgery.   Dr. Marlyn Corporal, MD; Pixie Casino, MD; Lorretta Harp, MD Cc: Fidel Levy, RN           Patient had Carotid duplex Friday. He is pre-op (next Wednesday) colonoscopy take-down. He is a vasculopath, has had XRT for larynx cancer. Chronic left carotid occlusion, with new finding of significant right ICA stenosis (velocity 314/105).   He denies any cerebrovascular symptoms.       Patient voiced understanding of results and agreed with plan. Referral to vascular surgery placed. Surgeon's office notified of need to cancel procedure - spoke with Raquel Sarna in triage.

## 2015-04-24 ENCOUNTER — Telehealth: Payer: Self-pay | Admitting: Adult Health

## 2015-04-24 NOTE — Telephone Encounter (Signed)
I called and spoke with Mr. [name] to check on him and offer to see him for survivorship when he returns to see Dr. Pablo Ledger on 05/31/15 for routine f/u.  He agreed with this plan.   He tells me that he is awaiting surgery for his colostomy reversal, which was postponed because of carotid artery occlusions; he is now having to see a vascular specialist in order to get cleared for surgery.  He tells me that he is not sure when his new surgery date will be, but is hopeful that it will not affect his f/u at the cancer center.  He states that he will call us if he is going to be unable to make it on 05/31/15 for his visits with Dr. Pablo Ledger and myself.  He agrees with this plan and I look forward to participating in his care.   Mike Craze, NP Survivorship Program [facility] [phone]  I will route this message to Dr. Pablo Ledger to make her aware.

## 2015-04-25 ENCOUNTER — Encounter (HOSPITAL_COMMUNITY): Admission: RE | Payer: Self-pay | Source: Ambulatory Visit

## 2015-04-25 ENCOUNTER — Inpatient Hospital Stay (HOSPITAL_COMMUNITY): Admission: RE | Admit: 2015-04-25 | Payer: Medicaid Other | Source: Ambulatory Visit | Admitting: Surgery

## 2015-04-25 SURGERY — CLOSURE, COLOSTOMY
Anesthesia: General

## 2015-04-26 ENCOUNTER — Encounter: Payer: Self-pay | Admitting: Surgery

## 2015-05-01 ENCOUNTER — Encounter: Payer: Self-pay | Admitting: Surgery

## 2015-05-01 ENCOUNTER — Other Ambulatory Visit: Payer: Self-pay

## 2015-05-01 ENCOUNTER — Ambulatory Visit (INDEPENDENT_AMBULATORY_CARE_PROVIDER_SITE_OTHER): Payer: Medicaid Other | Admitting: Surgery

## 2015-05-01 VITALS — BP 133/79 | HR 78 | Temp 98.1°F | Resp 16 | Ht 67.0 in | Wt 163.0 lb

## 2015-05-01 DIAGNOSIS — I6523 Occlusion and stenosis of bilateral carotid arteries: Secondary | ICD-10-CM

## 2015-05-01 NOTE — Progress Notes (Signed)
Vascular and Vein Specialist of [facility]  Patient name: [name] MRN: [id number] DOB: [birth year] Sex: male  REASON FOR CONSULT: carotid stenosis Referring Physician:  Dr. Sallyanne Kuster HPI: [name] is a 60 y.o. male, who is referred for evaluation of carotid occlusive disease.  The patient has a long-standing history of coronary artery disease.  He is status post CABG approximately 10 years ago.  He is also undergone percutaneous stenting as well.  He has had multiple heart attacks.  The patient has a history of peripheral vascular disease, having undergone iliac stenting in the past.  He has a history of left I amaurosis fugax.  This was many years ago.  He was found to have occlusion of the left common carotid artery.  His vision returned.  The patient is a long history of smoking, over 100 pack years.  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  He also had a colostomy following colon perforation secondary to diverticulitis.  He is scheduled to have his colostomy takedown, however his recent carotid duplex which showed a left carotid occlusion and 60-79% right carotid stenosis needed to be further evaluated.  The patient continues to be asymptomatic.  He denies numbness or weakness in either extremity.  He denies slurred speech.  He denies amaurosis fugax.  Past Medical History  Diagnosis Date  . Carotid stenosis   . Coronary artery disease     s/p CABG 2006; stenting to vein graft 05-2013; NSTEMI 06-2013 in setting of PEA arrest  . Subclavian artery stenosis, left     s/p stenting 2006  . Hypertension   . Hyperlipidemia   . Ischemic cardiomyopathy     echo 06/2013 EF 10-15% (normal 01/2013)  . At risk for sudden cardiac death, has lifevest at discharge 06/20/2013  . Shock liver 06/20/2013  . Hypothermia, induced, post arrest 06/20/2013  . Acute encephalopathy, improved 06/20/2013  . Dizziness 06/20/2013  . Weakness due  to cardiac arrest 06/20/2013  . Fever, possible aspiration-treated with antibiotics 06/20/2013  . Tobacco abuse     >100 pack year history   . Dyslipidemia 10/09/2013    [place] Study atorvastatin -eIRB # [id number] tablet Take 40 mg by mouth daily.   Marland Kitchen PAD (peripheral artery disease) ([place]) 10/09/2013    Occluded left common carotid and moderate right internal carotid artery stenosis. Previous stent to the left subclavian artery. Right iliac stent. Bilateral 60-99% renal artery stenosis    . Heart failure, acute on chronic, systolic and diastolic ([place]) [id number]  . Anxiety   . GERD (gastroesophageal reflux disease)   . Acute MI, troponin > 20, no obvious culprit vessel 06/20/2013  . Difficulty swallowing solids     Pt unable to swallow solid foods  . Shortness of breath dyspnea     with exertion  . Depression   . Arthritis   . Squamous cell carcinoma of larynx ([place]) 07/14/2014    Family History  Problem Relation Age of Onset  . Heart disease Maternal Aunt   . Heart attack Mother   . Stroke Mother   . Cancer Father   . Stroke Sister   . Heart Problems Sister     SOCIAL HISTORY: Social History   Social History  . Marital Status: Married    Spouse Name: N/A  . Number of Children: 82  . Years of Education: 10   Occupational History  . carpenter    Social History Main  Topics  . Smoking status: Former Smoker -- 4.00 packs/day for 45 years    Types: Cigarettes    Quit date: 06/09/2013  . Smokeless tobacco: Former Systems developer     Comment: tried for a few weeks  . Alcohol Use: 1.8 - 2.4 oz/week    3-4 Cans of beer per week     Comment: 3-6 12 oz cans daily   . Drug Use: No  . Sexual Activity: No   Other Topics Concern  . Not on file   Social History Narrative    Allergies  Allergen Reactions  . Cetuximab Anaphylaxis    Cardiac arrest 08/02/2014    Current Outpatient Prescriptions  Medication Sig Dispense Refill  . ALPRAZolam (XANAX) 0.5 MG tablet Place 1 tablet (0.5 mg total) into  feeding tube 3 (three) times daily as needed for anxiety. (Patient taking differently: 0.5 mg by Other route 3 (three) times daily as needed for anxiety (dissolve in water, pt can't swallow pills). )  0  . aspirin (ASPIRIN CHILDRENS) 81 MG chewable tablet Place 1 tablet (81 mg total) into feeding tube daily. (Patient taking differently: 81 mg by Other route daily. Dissolve in water) 30 tablet 0  . buPROPion (WELLBUTRIN) 100 MG tablet Place 1.5 tablets (150 mg total) into feeding tube 2 (two) times daily. (Patient taking differently: 150 mg by Other route 2 (two) times daily. Dissolve in water) 90 tablet 0  . carvedilol (COREG) 6.25 MG tablet Place 1 tablet (6.25 mg total) into feeding tube 2 (two) times daily. (Patient taking differently: 6.25 mg by Other route 2 (two) times daily. Dissolve in water) 90 tablet 3   No current facility-administered medications for this visit.    REVIEW OF SYSTEMS:  [X]  denotes positive finding, [ ]  denotes negative finding Cardiac  Comments:  Chest pain or chest pressure:    Shortness of breath upon exertion: x   Short of breath when lying flat:    Irregular heart rhythm:        Vascular    Pain in calf, thigh, or hip brought on by ambulation:    Pain in feet at night that wakes you up from your sleep:     Blood clot in your veins:    Leg swelling:         Pulmonary    Oxygen at home:    Productive cough:     Wheezing:         Neurologic    Sudden weakness in arms or legs:     Sudden numbness in arms or legs:     Sudden onset of difficulty speaking or slurred speech:    Temporary loss of vision in one eye:     Problems with dizziness:         Gastrointestinal    Blood in stool:     Vomited blood:         Genitourinary    Burning when urinating:     Blood in urine:        Psychiatric    Major depression:         Hematologic    Bleeding problems:    Problems with blood clotting too easily:        Skin    Rashes or ulcers:          Constitutional    Fever or chills:      PHYSICAL EXAM: Filed Vitals:   05/01/15 1323 05/01/15 1326  BP: 122/74 133/79  Pulse: 76 78  Temp:  98.1 F (36.7 C)  TempSrc:  Oral  Resp:  16  Height:  5\' 7"  (1.702 m)  Weight:  163 lb (73.936 kg)  SpO2:  98%    GENERAL: The patient is a well-nourished male, in no acute distress. The vital signs are documented above. CARDIAC: There is a regular rate and rhythm.  VASCULAR: No carotid bruits.   PULMONARY: There is good air exchange bilaterally without wheezing or rales. ABDOMEN: Soft and non-tender with normal pitched bowel sounds.  MUSCULOSKELETAL: There are no major deformities or cyanosis. NEUROLOGIC: No focal weakness or paresthesias are detected. SKIN: There are no ulcers or rashes noted. PSYCHIATRIC: The patient has a normal affect.  DATA:  I have reviewed his carotid ultrasound.  This shows an occluded left carotid artery with 60-79% right carotid stenosis  MEDICAL ISSUES: I discussed with the patient that ultrasound can be misleading in the setting of bilateral carotid occlusion.  In order to get him cleared to proceed with his colostomy takedown, I have recommended carotid angiography to evaluate the stenosis within the right internal carotid artery.  If the stenosis is less than 80%, he would be cleared for colostomy takedown.  If it is greater than 80%, I would recommend carotid stenting to treat the lesion, as he has a recent history of radiation to the neck.  He understands if he needs carotid stenting, this would not be done at this setting.  His Angie Phillip Heal has been scheduled for Tuesday, April 25   Annamarie Major Vascular and Vein Specialists of [place]: [phone]

## 2015-05-08 ENCOUNTER — Other Ambulatory Visit: Payer: Self-pay | Admitting: *Deleted

## 2015-05-08 ENCOUNTER — Encounter (HOSPITAL_COMMUNITY)
Admission: RE | Disposition: A | Discharge status: Home / Self Care | Payer: Self-pay | Source: Ambulatory Visit | Attending: Surgery

## 2015-05-08 ENCOUNTER — Ambulatory Visit (HOSPITAL_COMMUNITY)
Admission: RE | Admit: 2015-05-08 | Discharge: 2015-05-08 | Disposition: A | Discharge status: Home / Self Care | Payer: Medicaid Other | Source: Ambulatory Visit | Attending: Surgery | Admitting: Surgery

## 2015-05-08 DIAGNOSIS — Z933 Colostomy status: Secondary | ICD-10-CM | POA: Insufficient documentation

## 2015-05-08 DIAGNOSIS — M1990 Unspecified osteoarthritis, unspecified site: Secondary | ICD-10-CM | POA: Insufficient documentation

## 2015-05-08 DIAGNOSIS — Z8249 Family history of ischemic heart disease and other diseases of the circulatory system: Secondary | ICD-10-CM | POA: Diagnosis not present

## 2015-05-08 DIAGNOSIS — Z7982 Long term (current) use of aspirin: Secondary | ICD-10-CM | POA: Diagnosis not present

## 2015-05-08 DIAGNOSIS — K219 Gastro-esophageal reflux disease without esophagitis: Secondary | ICD-10-CM | POA: Insufficient documentation

## 2015-05-08 DIAGNOSIS — E785 Hyperlipidemia, unspecified: Secondary | ICD-10-CM | POA: Diagnosis not present

## 2015-05-08 DIAGNOSIS — F329 Major depressive disorder, single episode, unspecified: Secondary | ICD-10-CM | POA: Diagnosis not present

## 2015-05-08 DIAGNOSIS — I252 Old myocardial infarction: Secondary | ICD-10-CM | POA: Diagnosis not present

## 2015-05-08 DIAGNOSIS — Z8521 Personal history of malignant neoplasm of larynx: Secondary | ICD-10-CM | POA: Diagnosis not present

## 2015-05-08 DIAGNOSIS — Z87891 Personal history of nicotine dependence: Secondary | ICD-10-CM | POA: Insufficient documentation

## 2015-05-08 DIAGNOSIS — I739 Peripheral vascular disease, unspecified: Secondary | ICD-10-CM | POA: Insufficient documentation

## 2015-05-08 DIAGNOSIS — I110 Hypertensive heart disease with heart failure: Secondary | ICD-10-CM | POA: Insufficient documentation

## 2015-05-08 DIAGNOSIS — I2510 Atherosclerotic heart disease of native coronary artery without angina pectoris: Secondary | ICD-10-CM | POA: Insufficient documentation

## 2015-05-08 DIAGNOSIS — F419 Anxiety disorder, unspecified: Secondary | ICD-10-CM | POA: Diagnosis not present

## 2015-05-08 DIAGNOSIS — I6523 Occlusion and stenosis of bilateral carotid arteries: Secondary | ICD-10-CM | POA: Insufficient documentation

## 2015-05-08 DIAGNOSIS — I6521 Occlusion and stenosis of right carotid artery: Secondary | ICD-10-CM | POA: Diagnosis present

## 2015-05-08 DIAGNOSIS — I5042 Chronic combined systolic (congestive) and diastolic (congestive) heart failure: Secondary | ICD-10-CM | POA: Insufficient documentation

## 2015-05-08 DIAGNOSIS — Z923 Personal history of irradiation: Secondary | ICD-10-CM | POA: Diagnosis not present

## 2015-05-08 DIAGNOSIS — I255 Ischemic cardiomyopathy: Secondary | ICD-10-CM | POA: Insufficient documentation

## 2015-05-08 DIAGNOSIS — Z951 Presence of aortocoronary bypass graft: Secondary | ICD-10-CM | POA: Insufficient documentation

## 2015-05-08 HISTORY — PX: PERIPHERAL VASCULAR CATHETERIZATION: SHX172C

## 2015-05-08 LAB — POCT I-STAT, CHEM 8
BUN: 14 mg/dL (ref 6–20)
CALCIUM ION: 1.08 mmol/L — AB (ref 1.13–1.30)
CHLORIDE: 105 mmol/L (ref 101–111)
Creatinine, Ser: 1 mg/dL (ref 0.61–1.24)
GLUCOSE: 96 mg/dL (ref 65–99)
HCT: 44 % (ref 39.0–52.0)
Hemoglobin: 15 g/dL (ref 13.0–17.0)
Potassium: 5.1 mmol/L (ref 3.5–5.1)
Sodium: 139 mmol/L (ref 135–145)
TCO2: 25 mmol/L (ref 0–100)

## 2015-05-08 SURGERY — AORTIC ARCH ANGIOGRAPHY
Anesthesia: LOCAL

## 2015-05-08 MED ORDER — LIDOCAINE HCL (PF) 1 % IJ SOLN
INTRAMUSCULAR | Status: DC | PRN
  Administered 2015-05-08: 12 mL

## 2015-05-08 MED ORDER — ACETAMINOPHEN 325 MG RE SUPP
325.0000 mg | RECTAL | Status: DC | PRN
  Filled 2015-05-08: qty 2

## 2015-05-08 MED ORDER — OXYCODONE HCL 5 MG PO TABS
5.0000 mg | ORAL_TABLET | ORAL | Status: DC | PRN
  Filled 2015-05-08: qty 2

## 2015-05-08 MED ORDER — PHENOL 1.4 % MT LIQD
1.0000 | OROMUCOSAL | Status: DC | PRN
  Filled 2015-05-08: qty 177

## 2015-05-08 MED ORDER — ONDANSETRON HCL 4 MG/2ML IJ SOLN: 4.0000 mg | Freq: Four times a day (QID) | INTRAMUSCULAR | Status: DC | PRN

## 2015-05-08 MED ORDER — METOPROLOL TARTRATE 5 MG/5ML IV SOLN: 2.0000 mg | INTRAVENOUS | Status: DC | PRN

## 2015-05-08 MED ORDER — GUAIFENESIN-DM 100-10 MG/5ML PO SYRP
15.0000 mL | ORAL_SOLUTION | ORAL | Status: DC | PRN
  Filled 2015-05-08: qty 15

## 2015-05-08 MED ORDER — HYDRALAZINE HCL 20 MG/ML IJ SOLN: 5.0000 mg | INTRAMUSCULAR | Status: DC | PRN

## 2015-05-08 MED ORDER — HEPARIN (PORCINE) IN NACL 2-0.9 UNIT/ML-% IJ SOLN
INTRAMUSCULAR | Status: DC | PRN
  Administered 2015-05-08: 1000 mL via INTRA_ARTERIAL

## 2015-05-08 MED ORDER — SODIUM CHLORIDE 0.9 % IV SOLN: 1.0000 mL/kg/h | INTRAVENOUS | Status: DC

## 2015-05-08 MED ORDER — ACETAMINOPHEN 325 MG PO TABS
325.0000 mg | ORAL_TABLET | ORAL | Status: DC | PRN
  Filled 2015-05-08: qty 2

## 2015-05-08 MED ORDER — HEPARIN (PORCINE) IN NACL 2-0.9 UNIT/ML-% IJ SOLN
INTRAMUSCULAR | Status: AC
  Filled 2015-05-08: qty 1000

## 2015-05-08 MED ORDER — LABETALOL HCL 5 MG/ML IV SOLN: 10.0000 mg | INTRAVENOUS | Status: DC | PRN

## 2015-05-08 MED ORDER — LIDOCAINE HCL (PF) 1 % IJ SOLN
INTRAMUSCULAR | Status: AC
  Filled 2015-05-08: qty 30

## 2015-05-08 MED ORDER — DOCUSATE SODIUM 100 MG PO CAPS: 100.0000 mg | ORAL_CAPSULE | Freq: Every day | ORAL | Status: DC

## 2015-05-08 MED ORDER — ALUM & MAG HYDROXIDE-SIMETH 200-200-20 MG/5ML PO SUSP
15.0000 mL | ORAL | Status: DC | PRN
  Filled 2015-05-08: qty 30

## 2015-05-08 MED ORDER — SODIUM CHLORIDE 0.9 % IV SOLN
INTRAVENOUS | Status: DC
  Administered 2015-05-08: 10:00:00 via INTRAVENOUS

## 2015-05-08 MED ORDER — MORPHINE SULFATE (PF) 10 MG/ML IV SOLN: 2.0000 mg | INTRAVENOUS | Status: DC | PRN

## 2015-05-08 SURGICAL SUPPLY — 12 items
CATH ANGIO 5F BER2 100CM (CATHETERS) ×2 IMPLANT
CATH ANGIO 5F PIGTAIL 100CM (CATHETERS) ×2 IMPLANT
COVER PRB 48X5XTLSCP FOLD TPE (BAG) ×1 IMPLANT
COVER PROBE 5X48 (BAG) ×1
KIT MICROINTRODUCER STIFF 5F (SHEATH) ×2 IMPLANT
KIT PV (KITS) ×2 IMPLANT
SHEATH PINNACLE 5F 10CM (SHEATH) ×2 IMPLANT
SHIELD RADPAD SCOOP 12X17 (MISCELLANEOUS) ×2 IMPLANT
SYR MEDRAD MARK V 150ML (SYRINGE) ×2 IMPLANT
TRANSDUCER W/STOPCOCK (MISCELLANEOUS) ×2 IMPLANT
TRAY PV CATH (CUSTOM PROCEDURE TRAY) ×2 IMPLANT
WIRE BENTSON .035X145CM (WIRE) ×2 IMPLANT

## 2015-05-08 NOTE — Discharge Instructions (Signed)

## 2015-05-08 NOTE — Op Note (Signed)
    Patient name: [name] MRN: [id number] DOB: [birth year] Sex: male  05/08/2015 Pre-operative Diagnosis: Right carotid stenosis Post-operative diagnosis:  Same Surgeon:  Annamarie Major Procedure Performed:  1.  Utrasound-guided access, right femoral artery  2.  Aortic arch angiogram  3.  Second order catheterization (see his right common carotid artery)  4.  Right carotid angiogram   Indications:  The patient is being evaluated for intra-abdominal surgery.  Carotid ultrasound showed 60-79% stenosis in the setting of an occluded left carotid artery.  He comes in for intraoperative evaluation  Procedure:  The patient was identified in the holding area and taken to room 8.  The patient was then placed supine on the table and prepped and draped in the usual sterile fashion.  A time out was called.  Ultrasound was used to evaluate the right common femoral artery.  It was patent .  A digital ultrasound image was acquired.  A micropuncture needle was used to access the right common femoral artery under ultrasound guidance.  An 018 wire was advanced without resistance and a micropuncture sheath was placed.  The 018 wire was removed and a benson wire was placed.  The micropuncture sheath was exchanged for a 5 french sheath.a pigtail catheter was advanced into the ascending aorta and an aortic arch Joetta Manners was performed.  Next, a Berenstein 2 catheter was used to select the innominate artery and advanced over the wire into the right common carotid artery and a right carotid antrum and images  Were acquired.  The intracranial images will be separately dictated by neuroradiology.  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  No significant innominate or right subclavian stenosis is identified.  The left common carotid artery is occluded at its origin.  A stent within the left subclavian artery is widely patent  Right carotid artery:  approximate 70% stenosis is identified  within the right internal carotid artery.   Intervention:  none  Impression:  #1  Approximate 70% stenosis within the right internal carotid artery  #2  Occluded left common carotid artery at its origin  #3  Patent left subclavian artery stent    V. Annamarie Major, M.D. [facility] Specialists of [place] Office: [phone] Pager:  [phone]

## 2015-05-08 NOTE — Interval H&P Note (Signed)
History and Physical Interval Note:  05/08/2015 11:15 AM  [name]  has presented today for surgery, with the diagnosis of right carotid stenosis  The various methods of treatment have been discussed with the patient and family. After consideration of risks, benefits and other options for treatment, the patient has consented to  Procedure(s): Aortic Arch Angiography (N/A) Carotid Angiography (N/A) as a surgical intervention .  The patient's history has been reviewed, patient examined, no change in status, stable for surgery.  I have reviewed the patient's chart and labs.  Questions were answered to the patient's satisfaction.     Annamarie Major

## 2015-05-08 NOTE — H&P (View-Only) (Signed)
Vascular and Vein Specialist of [facility]  Patient name: [name] MRN: [id number] DOB: [birth year] Sex: male  REASON FOR CONSULT: carotid stenosis Referring Physician:  Dr. Sallyanne Kuster HPI: [name] is a 60 y.o. male, who is referred for evaluation of carotid occlusive disease.  The patient has a long-standing history of coronary artery disease.  He is status post CABG approximately 10 years ago.  He is also undergone percutaneous stenting as well.  He has had multiple heart attacks.  The patient has a history of peripheral vascular disease, having undergone iliac stenting in the past.  He has a history of left I amaurosis fugax.  This was many years ago.  He was found to have occlusion of the left common carotid artery.  His vision returned.  The patient is a long history of smoking, over 100 pack years.  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  He also had a colostomy following colon perforation secondary to diverticulitis.  He is scheduled to have his colostomy takedown, however his recent carotid duplex which showed a left carotid occlusion and 60-79% right carotid stenosis needed to be further evaluated.  The patient continues to be asymptomatic.  He denies numbness or weakness in either extremity.  He denies slurred speech.  He denies amaurosis fugax.  Past Medical History  Diagnosis Date  . Carotid stenosis   . Coronary artery disease     s/p CABG 2006; stenting to vein graft 05-2013; NSTEMI 06-2013 in setting of PEA arrest  . Subclavian artery stenosis, left     s/p stenting 2006  . Hypertension   . Hyperlipidemia   . Ischemic cardiomyopathy     echo 06/2013 EF 10-15% (normal 01/2013)  . At risk for sudden cardiac death, has lifevest at discharge 06/20/2013  . Shock liver 06/20/2013  . Hypothermia, induced, post arrest 06/20/2013  . Acute encephalopathy, improved 06/20/2013  . Dizziness 06/20/2013  . Weakness due  to cardiac arrest 06/20/2013  . Fever, possible aspiration-treated with antibiotics 06/20/2013  . Tobacco abuse     >100 pack year history   . Dyslipidemia 10/09/2013    [place] Study atorvastatin -eIRB # [id number] tablet Take 40 mg by mouth daily.   Marland Kitchen PAD (peripheral artery disease) ([place]) 10/09/2013    Occluded left common carotid and moderate right internal carotid artery stenosis. Previous stent to the left subclavian artery. Right iliac stent. Bilateral 60-99% renal artery stenosis    . Heart failure, acute on chronic, systolic and diastolic ([place]) [id number]  . Anxiety   . GERD (gastroesophageal reflux disease)   . Acute MI, troponin > 20, no obvious culprit vessel 06/20/2013  . Difficulty swallowing solids     Pt unable to swallow solid foods  . Shortness of breath dyspnea     with exertion  . Depression   . Arthritis   . Squamous cell carcinoma of larynx ([place]) 07/14/2014    Family History  Problem Relation Age of Onset  . Heart disease Maternal Aunt   . Heart attack Mother   . Stroke Mother   . Cancer Father   . Stroke Sister   . Heart Problems Sister     SOCIAL HISTORY: Social History   Social History  . Marital Status: Married    Spouse Name: N/A  . Number of Children: 36  . Years of Education: 10   Occupational History  . carpenter    Social History Main  Topics  . Smoking status: Former Smoker -- 4.00 packs/day for 45 years    Types: Cigarettes    Quit date: 06/09/2013  . Smokeless tobacco: Former Systems developer     Comment: tried for a few weeks  . Alcohol Use: 1.8 - 2.4 oz/week    3-4 Cans of beer per week     Comment: 3-6 12 oz cans daily   . Drug Use: No  . Sexual Activity: No   Other Topics Concern  . Not on file   Social History Narrative    Allergies  Allergen Reactions  . Cetuximab Anaphylaxis    Cardiac arrest 08/02/2014    Current Outpatient Prescriptions  Medication Sig Dispense Refill  . ALPRAZolam (XANAX) 0.5 MG tablet Place 1 tablet (0.5 mg total) into  feeding tube 3 (three) times daily as needed for anxiety. (Patient taking differently: 0.5 mg by Other route 3 (three) times daily as needed for anxiety (dissolve in water, pt can't swallow pills). )  0  . aspirin (ASPIRIN CHILDRENS) 81 MG chewable tablet Place 1 tablet (81 mg total) into feeding tube daily. (Patient taking differently: 81 mg by Other route daily. Dissolve in water) 30 tablet 0  . buPROPion (WELLBUTRIN) 100 MG tablet Place 1.5 tablets (150 mg total) into feeding tube 2 (two) times daily. (Patient taking differently: 150 mg by Other route 2 (two) times daily. Dissolve in water) 90 tablet 0  . carvedilol (COREG) 6.25 MG tablet Place 1 tablet (6.25 mg total) into feeding tube 2 (two) times daily. (Patient taking differently: 6.25 mg by Other route 2 (two) times daily. Dissolve in water) 90 tablet 3   No current facility-administered medications for this visit.    REVIEW OF SYSTEMS:  [X]  denotes positive finding, [ ]  denotes negative finding Cardiac  Comments:  Chest pain or chest pressure:    Shortness of breath upon exertion: x   Short of breath when lying flat:    Irregular heart rhythm:        Vascular    Pain in calf, thigh, or hip brought on by ambulation:    Pain in feet at night that wakes you up from your sleep:     Blood clot in your veins:    Leg swelling:         Pulmonary    Oxygen at home:    Productive cough:     Wheezing:         Neurologic    Sudden weakness in arms or legs:     Sudden numbness in arms or legs:     Sudden onset of difficulty speaking or slurred speech:    Temporary loss of vision in one eye:     Problems with dizziness:         Gastrointestinal    Blood in stool:     Vomited blood:         Genitourinary    Burning when urinating:     Blood in urine:        Psychiatric    Major depression:         Hematologic    Bleeding problems:    Problems with blood clotting too easily:        Skin    Rashes or ulcers:          Constitutional    Fever or chills:      PHYSICAL EXAM: Filed Vitals:   05/01/15 1323 05/01/15 1326  BP: 122/74 133/79  Pulse: 76 78  Temp:  98.1 F (36.7 C)  TempSrc:  Oral  Resp:  16  Height:  5\' 7"  (1.702 m)  Weight:  163 lb (73.936 kg)  SpO2:  98%    GENERAL: The patient is a well-nourished male, in no acute distress. The vital signs are documented above. CARDIAC: There is a regular rate and rhythm.  VASCULAR: No carotid bruits.   PULMONARY: There is good air exchange bilaterally without wheezing or rales. ABDOMEN: Soft and non-tender with normal pitched bowel sounds.  MUSCULOSKELETAL: There are no major deformities or cyanosis. NEUROLOGIC: No focal weakness or paresthesias are detected. SKIN: There are no ulcers or rashes noted. PSYCHIATRIC: The patient has a normal affect.  DATA:  I have reviewed his carotid ultrasound.  This shows an occluded left carotid artery with 60-79% right carotid stenosis  MEDICAL ISSUES: I discussed with the patient that ultrasound can be misleading in the setting of bilateral carotid occlusion.  In order to get him cleared to proceed with his colostomy takedown, I have recommended carotid angiography to evaluate the stenosis within the right internal carotid artery.  If the stenosis is less than 80%, he would be cleared for colostomy takedown.  If it is greater than 80%, I would recommend carotid stenting to treat the lesion, as he has a recent history of radiation to the neck.  He understands if he needs carotid stenting, this would not be done at this setting.  His Angie Phillip Heal has been scheduled for Tuesday, April 25   Annamarie Major [facility] of [place]: [phone]

## 2015-05-09 ENCOUNTER — Encounter (HOSPITAL_COMMUNITY): Payer: Self-pay | Admitting: Surgery

## 2015-05-15 ENCOUNTER — Encounter: Payer: Medicaid Other | Admitting: Vascular Surgery

## 2015-05-22 ENCOUNTER — Other Ambulatory Visit: Payer: Self-pay | Admitting: Surgery

## 2015-05-22 ENCOUNTER — Telehealth: Payer: Self-pay | Admitting: Surgery

## 2015-05-22 NOTE — Telephone Encounter (Signed)
-----   Message from Mena Goes, RN sent at 05/08/2015  2:54 PM EDT ----- Regarding: schedule   ----- Message -----    From: Serafina Mitchell, MD    Sent: 05/08/2015  12:28 PM      To: V[facility] Charge Pool  05/08/2015:  Surgeon:  Annamarie Major Procedure Performed:  1.  Utrasound-guided access, right femoral artery  2.  Aortic arch angiogram  3.  Second order catheterization (see his right common carotid artery)  4.  Right carotid angiogram   Follow-up 6 months with carotid ultrasound

## 2015-05-22 NOTE — Telephone Encounter (Signed)
sched appt 10/30; lab at 2:30 and MD at 3:15. Mailed appt letter to inform pt of appt.

## 2015-05-28 ENCOUNTER — Telehealth: Payer: Self-pay | Admitting: Adult Health

## 2015-05-28 NOTE — Telephone Encounter (Signed)
I spoke with [name] letting him know that I needed to cancel his survivorship appt with me due to a recent family emergency and my need to be out of the office.  I reminded him of his appt with Dr. Pablo Ledger this Thursday, 05/31/15 at 1:30 pm, which he states he did not know about.  I gave him the [facility] number to call back if he has conflicts with that appt to see Dr. Pablo Ledger.    In lieu of rescheduling his survivorship visit, he agreed to have me mail him his survivorship care plan and resources instead.  He has upcoming GI surgery and will be busy recovering from that, making rescheduling appts after that time difficult.  He agreed with this plan. I encouraged him to call me with any questions or concerns.   Mike Craze, NP [place] [phone]

## 2015-05-29 ENCOUNTER — Telehealth: Payer: Self-pay | Admitting: *Deleted

## 2015-05-29 NOTE — Telephone Encounter (Signed)
  Oncology Nurse Navigator Documentation  Navigator Location: [facility] (05/29/15 0924) Navigator Encounter Type: Telephone (05/29/15 0924) Telephone: Incoming Call (05/29/15 [id number]) Abnormal Finding Date: 06/22/14 (05/29/15 [id number]) Confirmed Diagnosis Date: 07/06/14 (05/29/15 [id number])   Treatment Initiated Date: 09/05/14 (adverse rxt to 08/02/14 Cetuximab, hospitalized; RT delayed) (05/29/15 0924)   Treatment Phase: Post-Tx Follow-up (05/29/15 [id number])                  Acuity: Level 1 (05/29/15 0924)       Patient's m-in-law called to provide update and confirmation of appt adjustment to see Dr. Pablo Ledger:  [name] is now scheduled for colostomy reversal and PAC removal on 06/13/15.  Follow-up appt with Dr. Pablo Ledger has been rescheduled for 6/14 from 5/18.  I indicated I would arrange rescheduling of his 5/18 Survivorship appt for 6/14 as well. She understands I can be contacted with needs/concerns.  Edward Orem, RN, BSN, [place] at [place] [phone]        Time Spent with Patient: 15 (05/29/15 [id number])

## 2015-05-31 ENCOUNTER — Encounter: Payer: Medicaid Other | Admitting: Adult Health

## 2015-05-31 ENCOUNTER — Ambulatory Visit: Payer: Self-pay | Admitting: Radiation Oncology

## 2015-06-06 ENCOUNTER — Encounter (HOSPITAL_COMMUNITY): Payer: Self-pay

## 2015-06-06 ENCOUNTER — Other Ambulatory Visit: Payer: Self-pay | Admitting: Surgery

## 2015-06-06 ENCOUNTER — Encounter (HOSPITAL_COMMUNITY)
Admission: RE | Admit: 2015-06-06 | Discharge: 2015-06-06 | Disposition: A | Discharge status: Home / Self Care | Payer: Medicaid Other | Source: Ambulatory Visit | Attending: Surgery | Admitting: Surgery

## 2015-06-06 DIAGNOSIS — I110 Hypertensive heart disease with heart failure: Secondary | ICD-10-CM | POA: Insufficient documentation

## 2015-06-06 DIAGNOSIS — Z8521 Personal history of malignant neoplasm of larynx: Secondary | ICD-10-CM | POA: Insufficient documentation

## 2015-06-06 DIAGNOSIS — I2510 Atherosclerotic heart disease of native coronary artery without angina pectoris: Secondary | ICD-10-CM | POA: Insufficient documentation

## 2015-06-06 DIAGNOSIS — Z87891 Personal history of nicotine dependence: Secondary | ICD-10-CM | POA: Diagnosis not present

## 2015-06-06 DIAGNOSIS — F419 Anxiety disorder, unspecified: Secondary | ICD-10-CM | POA: Diagnosis not present

## 2015-06-06 DIAGNOSIS — K219 Gastro-esophageal reflux disease without esophagitis: Secondary | ICD-10-CM | POA: Insufficient documentation

## 2015-06-06 DIAGNOSIS — Z01812 Encounter for preprocedural laboratory examination: Secondary | ICD-10-CM | POA: Insufficient documentation

## 2015-06-06 DIAGNOSIS — I5042 Chronic combined systolic (congestive) and diastolic (congestive) heart failure: Secondary | ICD-10-CM | POA: Insufficient documentation

## 2015-06-06 DIAGNOSIS — Z79899 Other long term (current) drug therapy: Secondary | ICD-10-CM | POA: Insufficient documentation

## 2015-06-06 DIAGNOSIS — E785 Hyperlipidemia, unspecified: Secondary | ICD-10-CM | POA: Diagnosis not present

## 2015-06-06 DIAGNOSIS — Z01818 Encounter for other preprocedural examination: Secondary | ICD-10-CM | POA: Insufficient documentation

## 2015-06-06 DIAGNOSIS — Z933 Colostomy status: Secondary | ICD-10-CM | POA: Insufficient documentation

## 2015-06-06 DIAGNOSIS — Z923 Personal history of irradiation: Secondary | ICD-10-CM | POA: Insufficient documentation

## 2015-06-06 DIAGNOSIS — Z8674 Personal history of sudden cardiac arrest: Secondary | ICD-10-CM | POA: Diagnosis not present

## 2015-06-06 DIAGNOSIS — Z951 Presence of aortocoronary bypass graft: Secondary | ICD-10-CM | POA: Diagnosis not present

## 2015-06-06 DIAGNOSIS — I252 Old myocardial infarction: Secondary | ICD-10-CM | POA: Diagnosis not present

## 2015-06-06 DIAGNOSIS — Z955 Presence of coronary angioplasty implant and graft: Secondary | ICD-10-CM | POA: Insufficient documentation

## 2015-06-06 DIAGNOSIS — I739 Peripheral vascular disease, unspecified: Secondary | ICD-10-CM | POA: Diagnosis not present

## 2015-06-06 DIAGNOSIS — Z7982 Long term (current) use of aspirin: Secondary | ICD-10-CM | POA: Insufficient documentation

## 2015-06-06 HISTORY — DX: Incisional hernia without obstruction or gangrene: K43.2

## 2015-06-06 HISTORY — DX: Presence of spectacles and contact lenses: Z97.3

## 2015-06-06 LAB — COMPREHENSIVE METABOLIC PANEL
ALK PHOS: 90 U/L (ref 38–126)
ALT: 18 U/L (ref 17–63)
ANION GAP: 7 (ref 5–15)
AST: 20 U/L (ref 15–41)
Albumin: 3.9 g/dL (ref 3.5–5.0)
BILIRUBIN TOTAL: 0.6 mg/dL (ref 0.3–1.2)
BUN: 13 mg/dL (ref 6–20)
CALCIUM: 10 mg/dL (ref 8.9–10.3)
CO2: 27 mmol/L (ref 22–32)
Chloride: 104 mmol/L (ref 101–111)
Creatinine, Ser: 1.13 mg/dL (ref 0.61–1.24)
GFR calc non Af Amer: >60 mL/min (ref >60)
GLUCOSE: 96 mg/dL (ref 65–99)
Potassium: 4.2 mmol/L (ref 3.5–5.1)
Sodium: 138 mmol/L (ref 135–145)
TOTAL PROTEIN: 7.2 g/dL (ref 6.5–8.1)

## 2015-06-06 LAB — CBC
HEMATOCRIT: 37.2 % — AB (ref 39.0–52.0)
Hemoglobin: 12 g/dL — ABNORMAL LOW (ref 13.0–17.0)
MCH: 30.6 pg (ref 26.0–34.0)
MCHC: 32.3 g/dL (ref 30.0–36.0)
MCV: 94.9 fL (ref 78.0–100.0)
Platelets: 289 10*3/uL (ref 150–400)
RBC: 3.92 MIL/uL — AB (ref 4.22–5.81)
RDW: 14.8 % (ref 11.5–15.5)
WBC: 6.1 10*3/uL (ref 4.0–10.5)

## 2015-06-06 NOTE — Progress Notes (Signed)
Pt denies any acute cardiopulmonary issues. Pt stated that both surgeon and cardiologist agreed that he stop taking Aspirin 5 days prior to procedure. Pt chart forwarded to anesthesia for clearance update.

## 2015-06-06 NOTE — Progress Notes (Addendum)
Anesthesia Note: Patient is a 60 year old male scheduled for colostomy takedown on 05/16/15 by Dr. Coralie Keens. Procedure was initially scheduled for 04/25/15, but was postponed for vascular surgery evaluation due to carotid artery stenosis. I evaluated patient at his last PAT visit on 04/18/15. He is for updated labs today.   History includes CAD s/p CABG (SVG-LAD) 09/2004, NSTEMI 01/19/13 s/p DES CX [facility]), NSTEMI 06/01/13 (severe mid SVG-LAD stenosis) s/p DES to LAD [id number] (complicated by thrombus embolization to mid SVG s/p retrieval) [facility]), VF cardiac arrest 06/09/13 (unwitnessed; received at least 15 min CPR and Epi X 3, hypothermia protocol, hospital course complicated by cardiogenic shock, liver shock, respiratory failure, and ARI; p2y12 was elevated and Plavix changed to Brilinta; hospitalized thru 06/20/13), ischemic cardiomyopathy (required life vest 06/2013- 09/2013), chronic combined systolic and diastolic CHF, HTN, HLD, PAD with left SCA stent '06 and right iliac artery stent '10, moderate to severe renal artery stenosis, carotid artery stenosis (occluded LCCA, moderate 70% RICA stenosis '17), perforated sigmoid diverticulitis s/p exploratory laparotomy with sigmoid colectomy and colostomy 08/05/14, 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 OSA screening score was 5 in April.   - PCP is Dr. [name].  - RAD-ONC is Dr. [name]. HEM-ONC is Dr. [name].  - Cardiologist is Dr. Sanda Klein, last visit 04/05/15 for follow-up and pre-operative clearance. His note states, "Preop risk evaluation: simply by virtue of the extent of his previous cardiac problems, Yonis's risk for major cardiovascular complications is low to moderate, however I believe that at this  point he is optimized hemodynamically and there is no need for additional testing or treatment before the planned colostomy takedown surgery." Following this visit, patient had a follow-up carotid duplex, Dr. Sallyanne Kuster did recommend vascular surgery evaluation prior to surgery.  - Vascular surgeon is Dr. Theotis Burrow, last seen on 05/01/15 with aortic arch and right carotid angiogram recommended. If RICA stenosis was < 80% then he would clear him to proceed with his colostomy takedown, but if > 80% he would recommend proceeding with carotid stenting. Angiogram showed 70% RICA stenosis, so six month follow-up with carotid duplex planned.  - ENT is Dr. [name], last visit 04/05/15. He noted that indirect exam of the larynx revealed mobile cords with diffuse supraglottic edema. In April, patient reported at PAT that his anterior neck always feels tight since undergoing radiation. Dr. [name] had said this could take 1-2 years to improve. He sleeps on his side for comfort. His voice is hoarse. He has issues with dysphagia (as discussed above).  Meds include Xanax, ASA 81 mg, Wellbutrin, Coreg, ranitidine.  01/03/15 EKG: NSR, septal infarct (age undetermined), T wave abnormality, consider anterior ischemia.  08/07/14 Echo: Study Conclusions - Left ventricle: The cavity size was normal. Wall thickness was normal. Systolic function was normal. The estimated ejection fraction was in the range of 50% to 55%. There is hypokinesis of the mid-apicalanteroseptal myocardium. Left ventricular diastolic function parameters were normal. - Mild tricuspid regurgitation. (Comparison EF: 45-50% 09/27/13, 10-15% 06/10/13.)  07/04/14 Nuclear stress test: 1. There was no ST segment deviation noted during stress. Normal myocardial perfusion (2 day protocol). Gating not accurate secondary to technical difficulties.  06/15/13 Cardiac Cath (Dr. Daneen Schick): IMPRESSIONS:  1. Widely patent saphenous vein graft to the  mid  LAD. This vessel is stented and there is no evidence of ISR. 2. Widely patent proximal to mid circumflex stent. There is haziness in the circumflex proximal to the stent but no significant obstruction. 3. Mild to moderate distal left main stenosis in the 30-40% range. 4. 80-90% ostial LAD threatening a small to moderate diagonal territory in the distribution of the wall motion abnormality. The mid LAD off the retrograde segment from the graft insertion site is also occluded. 5. Widely patent RCA. 6. LVEF 40%. Basal to mid anterior wall moderate hypokinesis. RECOMMENDATION: The patient is a fixed basal to mid anterior wall motion abnormality. This is in the distribution of proximal LAD and diagonal. This raises the question of whether or not the ostial LAD/diagonal could be a source of ischemia that cause arrhythmia. Intervention in this area would be difficult due to heavy calcification and will involve the left main which was then threatened the circumflex. If recurrent angina, the only area that would explain this symptom at this time would be the ostial LAD diagonal territory. At some point consideration of a myocardial perfusion study to look for ischemia/problem myocardium in that region would be reasonable. Consideration of AICD implantation.  04/20/15 Carotid U/S: Impressions: 1. Significantly elevated RICA velocities (314/105), top end of the 60-79% range, with post-stenotic turbulence and dilatation. 2. Chronic left carotid occlusion (CCA, ICA and ECA). 3. Patent vertebral arteries with antegrade flow. 4. Normal subclavian arteries, bilaterally.  05/08/15 Aortic arch and right carotid angiogram: Impression: 1. Approximate 70% stenosis within the right internal carotid artery 2. Occluded left common carotid artery at its origin 3. Patent left subclavian artery stent  02/14/15 Post-radiation PET scan: IMPRESSION: Mild irregularity of the left supraglottic larynx, likely reflecting radiation  changes. No findings specific for residual tumor or metastatic disease.  01/29/15 CT soft tissue neck: IMPRESSION: 1. Pharynx and larynx: There is new, moderate diffuse edema involving the larynx related to interval radiation therapy. This is greatest in the supraglottic region although slightly extends into the subglottic larynx. The epiglottis is edematous. Changes of interval radiation therapy. Residual, asymmetric left supraglottic enhancement measures 1 cm in thickness, greatly improved from the mass present on the pretreatment study. 2. No enlarged cervical lymph nodes. 3. Vascular: Prominent vascular atherosclerosis the again noted with incompletely evaluated proximal right ICA stenosis. Chronically occluded left common and left cervical internal carotid arteries with partially visualized reconstitution of the ICA intracranially. Left subclavian artery stent.  10/14/14 CXR (in the setting of VDRF): IMPRESSION: 1. Support apparatus appears in good position. 2. Small bilateral pleural effusions.  Preoperative labs PENDING his PAT appointment.   Last ENT records including supraglottic and epiglottic edema noted on post-radiation CT and PET scans reviewed with anesthesiologist Dr. Conrad Choptank back in [place]. Patient had good mouth opening on exam at that time. Glidescope used 08/05/14. Patient has undergone neck radiation since. Definitive anesthesia plan following anesthesiologist evaluation on the day of surgery.   Will leave chart for follow-up lab results. (Update 06/07/15 9:34: Preoperative labs noted. Anticipate that he can proceed as planned if no acute changes.)  George Hugh [facility] Short Stay Center/Anesthesiology Phone [phone] 06/06/2015 1:18 PM

## 2015-06-06 NOTE — Progress Notes (Signed)
   06/06/15 1335  OBSTRUCTIVE SLEEP APNEA  Have you ever been diagnosed with sleep apnea through a sleep study? No  Do you snore loudly (loud enough to be heard through closed doors)?  1  Do you often feel tired, fatigued, or sleepy during the daytime (such as falling asleep during driving or talking to someone)? 0  Has anyone observed you stop breathing during your sleep? 1  Do you have, or are you being treated for high blood pressure? 1  BMI more than 35 kg/m2? 0  Age > 50 (1-yes) 1  Neck circumference greater than:Male 16 inches or larger, Male 17inches or larger? 0  Male Gender (Yes=1) 1  Obstructive Sleep Apnea Score 5

## 2015-06-06 NOTE — Pre-Procedure Instructions (Signed)
[name]  06/06/2015      [facility] - [place], [place] [name] [place] [address] Phone: [phone] Fax: [phone]  [facility] - [place], [place] - [address] AT [facility] [address] [place] [address] Phone: [phone] Fax: [phone]  [id number] [name], [place] [id number] [place] [id number] Phone: [phone] Fax: [place], [place] [address] Phone: [phone] Fax: [phone]    Your procedure is scheduled on Wednesday, Jun 13, 2015  Report to [facility] Admitting at 10:30 A.M.  Call this number if you have problems the morning of surgery:  [phone]   Remember: Please follow your surgeons bowel prep instructions.  Do not eat food or drink liquids after midnight Tuesday, Jun 12, 2015  Take these medicines the morning of surgery with A SIP OF WATER : buPROPion (WELLBUTRIN), carvedilol (COREG), if needed: ALPRAZolam Duanne Moron) for anxiety, RaNITidine for acid reflux Stop taking Aspirin, vitamins, fish oil and herbal medications. Do not take any NSAIDs ie: Ibuprofen, Advil, Naproxen, BC and Goody Powder or any medication containing Aspirin; stop now.  Do not wear jewelry, make-up or nail polish.  Do not wear lotions, powders, or perfumes.  You may not wear deodorant.  Do not shave 48 hours prior to surgery.  Men may shave face and neck.  Do not bring valuables to the hospital.  [facility] is not responsible for any belongings or valuables.  Contacts, dentures or bridgework may not be worn into surgery.  Leave your suitcase in the car.  After surgery it may be brought to your room.  For patients admitted to the hospital,  discharge time will be determined by your treatment team.  Patients discharged the day of surgery will not be allowed to drive home.   Name and phone number of your driver:   Special instructions:  [place] - Preparing for Surgery  Before surgery, you can play an important role.  Because skin is not sterile, your skin needs to be as free of germs as possible.  You can reduce the number of germs on you skin by washing with CHG (chlorahexidine gluconate) soap before surgery.  CHG is an antiseptic cleaner which kills germs and bonds with the skin to continue killing germs even after washing.  Please DO NOT use if you have an allergy to CHG or antibacterial soaps.  If your skin becomes reddened/irritated stop using the CHG and inform your nurse when you arrive at Short Stay.  Do not shave (including legs and underarms) for at least 48 hours prior to the first CHG shower.  You may shave your face.  Please follow these instructions carefully:   1.  Shower with CHG Soap the night before surgery and the morning of Surgery.  2.  If you choose to wash your hair, wash your hair first as usual with your normal shampoo.  3.  After you shampoo, rinse your hair and body thoroughly to remove the Shampoo.  4.  Use CHG as you would any other liquid soap.  You can apply chg directly  to the skin and wash gently with scrungie or a clean washcloth.  5.  Apply the CHG Soap to your body ONLY FROM THE NECK DOWN.  Do not use on open wounds or open sores.  Avoid contact with your eyes, ears, mouth and genitals (private parts).  Wash genitals (private parts) with your normal soap.  6.  Wash thoroughly, paying special attention to the area where your surgery will be performed.  7.  Thoroughly rinse your body with warm water from the neck down.  8.  DO NOT shower/wash with your normal soap after using and rinsing off the CHG Soap.  9.  Pat yourself dry with a clean towel.            10.  Wear clean pajamas.             11.  Place clean sheets on your bed the night of your first shower and do not sleep with pets.  Day of Surgery  Do not apply any lotions/deodorants the morning of surgery.  Please wear clean clothes to the hospital/surgery center.  Please read over the following fact sheets that you were given. Pain Booklet, Coughing and Deep Breathing and Surgical Site Infection Prevention

## 2015-06-12 MED ORDER — METRONIDAZOLE IN NACL 5-0.79 MG/ML-% IV SOLN
500.0000 mg | INTRAVENOUS | Status: AC
  Administered 2015-06-13: 500 mg via INTRAVENOUS
  Filled 2015-06-12: qty 100

## 2015-06-12 MED ORDER — CEFAZOLIN SODIUM-DEXTROSE 2-4 GM/100ML-% IV SOLN
2.0000 g | INTRAVENOUS | Status: AC
  Administered 2015-06-13: 2 g via INTRAVENOUS
  Filled 2015-06-12: qty 100

## 2015-06-12 NOTE — H&P (Signed)
[name] is an 60 y.o. male.   Chief Complaint: colostomy in place HPI: he is here today for reversal of his colostomy. This was performed for perforated diverticulitis which occurred during his treatment for laryngeal cancer.  He has now been doing well and is ready for reversal.  He has no complaints.  Past Medical History  Diagnosis Date  . Carotid stenosis   . Coronary artery disease     s/p CABG 2006; stenting to vein graft 05-2013; NSTEMI 06-2013 in setting of PEA arrest  . Subclavian artery stenosis, left     s/p stenting 2006  . Hypertension   . Hyperlipidemia   . Ischemic cardiomyopathy     echo 06/2013 EF 10-15% (normal 01/2013)  . At risk for sudden cardiac death, has lifevest at discharge 06/20/2013  . Shock liver 06/20/2013  . Hypothermia, induced, post arrest 06/20/2013  . Acute encephalopathy, improved 06/20/2013  . Dizziness 06/20/2013  . Weakness due to cardiac arrest 06/20/2013  . Fever, possible aspiration-treated with antibiotics 06/20/2013  . Tobacco abuse     >100 pack year history   . Dyslipidemia 10/09/2013    [place] Study atorvastatin -eIRB # [id number] tablet Take 40 mg by mouth daily.   Marland Kitchen PAD (peripheral artery disease) ([place]) 10/09/2013    Occluded left common carotid and moderate right internal carotid artery stenosis. Previous stent to the left subclavian artery. Right iliac stent. Bilateral 60-99% renal artery stenosis    . Heart failure, acute on chronic, systolic and diastolic ([place]) [id number]  . Anxiety   . GERD (gastroesophageal reflux disease)   . Acute MI, troponin > 20, no obvious culprit vessel 06/20/2013  . Difficulty swallowing solids     Pt unable to swallow solid foods  . Shortness of breath dyspnea     with exertion  . Depression   . Arthritis   . 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 Wears glasses   . Incisional hernia     Past Surgical History  Procedure Laterality Date  . Angioplasty  05/2013    stent to SVG - LAD vein graft at [facility]   . Nm myocar perf wall motion  06/2008    persantine myoview - normal pattern of systolic thickening/wall motion/perfusion;  . Lower extremity arterial doppler  08/2008    right and left ABI - mild arterial insufficiency at rest; R CIA/stent with 50-69% narrowing, L CIA with increased velocities (50-69% diameter reduction)  . Renal artery doppler  05/2008    right and left prox renal arteries with narrowing and increased velocities (60-99% diameter reduction)  . Iliac artery stent  07/24/2008    8x6 Smart nitinol self-expanding stent to origin of ilaci down into external iliac crossing the hypogastric artery (Dr. Adora Fridge)  . Subclavian angiogram Left 08/20/2004    8x69mm Genesis balloon mounted stent  . Left heart catheterization with coronary/graft angiogram N/A 06/15/2013    Procedure: LEFT HEART CATHETERIZATION WITH Beatrix Fetters;  Surgeon: Sinclair Grooms, MD;  Location: [facility] CATH LAB;  Service: Cardiovascular;  Laterality: N/A;  . Cardiac catheterization    . Direct laryngoscopy N/A 07/06/2014    Procedure: DIRECT LARYNGOSCOPY AND ESOPAGOSCOPY WITH BIOPSY;  Surgeon: [name], MD;  Location: [place];  Service: ENT;  Laterality: N/A;  . Laparotomy N/A 08/05/2014    Procedure: EXPLORATORY LAPAROTOMY WITH SIGMOIDOTOMY WITH COLECTOMY & COLOSTOMY;  Surgeon: Coralie Keens, MD;  Location: [place];  Service: General;  Laterality: N/A;  .  Coronary artery bypass graft  09/2004    SVG to LAD (at [facility])  . Colonoscopy    . Colostomy    . Peg tube removal    . Peripheral vascular catheterization N/A 05/08/2015    Procedure: Aortic Arch Angiography;  Surgeon: Serafina Mitchell, MD;  Location: [place] CV LAB;  Service: Cardiovascular;  Laterality: N/A;  . Peripheral vascular catheterization N/A 05/08/2015    Procedure: Carotid Angiography;  Surgeon: Serafina Mitchell, MD;  Location: [place] CV LAB;  Service: Cardiovascular;  Laterality: N/A;    Family History  Problem Relation Age of Onset  .  Heart disease Maternal Aunt   . Heart attack Mother   . Stroke Mother   . Cancer Father   . Stroke Sister   . Heart Problems Sister    Social History:  reports that he quit smoking about 2 years ago. His smoking use included Cigarettes. He has a 180 pack-year smoking history. He has quit using smokeless tobacco. He reports that he drinks about 1.8 - 2.4 oz of alcohol per week. He reports that he does not use illicit drugs.  Allergies:  Allergies  Allergen Reactions  . Cetuximab Anaphylaxis    Cardiac arrest 08/02/2014    No prescriptions prior to admission    No results found for this or any previous visit (from the past 48 hour(s)). No results found.  Review of Systems  All other systems reviewed and are negative.   There were no vitals taken for this visit. Physical Exam  Constitutional: He is oriented to person, place, and time. He appears well-developed and well-nourished.  HENT:  Head: Normocephalic and atraumatic.  Right Ear: External ear normal.  Left Ear: External ear normal.  Nose: Nose normal.  Mouth/Throat: No oropharyngeal exudate.  Eyes: Conjunctivae are normal. Pupils are equal, round, and reactive to light.  Neck: Normal range of motion. Neck supple. No tracheal deviation present.  Cardiovascular: Normal rate, regular rhythm, normal heart sounds and intact distal pulses.   Respiratory: Effort normal and breath sounds normal.  GI: Soft. Bowel sounds are normal. He exhibits no distension.  Ostomy is pink and well perfused  Musculoskeletal: He exhibits no edema.  Lymphadenopathy:    He has no cervical adenopathy.  Neurological: He is alert and oriented to person, place, and time.  Skin: Skin is warm and dry. He is not diaphoretic. No erythema.  Psychiatric: His behavior is normal.     Assessment/Plan colostomy in place  We will now proceed with colostomy takedown with colon reanastomosis.  Preoperative bowel prep and antibiotics will be given.  I  discussed the risk of procedure in detail.  These risks include but are not limited to bleeding, infection, injury to surrounding structures,anastomotic leak, anastomotic breakdown, need for further surgery, cardiopulmonary consultations, DVT, etc.  He understands and wishes to proceed with surgery  Damarco Keysor A, MD 06/12/2015, 4:25 PM

## 2015-06-13 ENCOUNTER — Encounter (HOSPITAL_COMMUNITY): Payer: Self-pay | Admitting: Certified Registered Nurse Anesthetist

## 2015-06-13 ENCOUNTER — Inpatient Hospital Stay (HOSPITAL_COMMUNITY): Payer: Medicaid Other | Admitting: Vascular Surgery

## 2015-06-13 ENCOUNTER — Encounter (HOSPITAL_COMMUNITY)
Admission: RE | Disposition: A | Discharge status: Home / Self Care | Payer: Self-pay | Source: Ambulatory Visit | Attending: Surgery

## 2015-06-13 ENCOUNTER — Inpatient Hospital Stay (HOSPITAL_COMMUNITY): Payer: Medicaid Other | Admitting: Certified Registered Nurse Anesthetist

## 2015-06-13 ENCOUNTER — Inpatient Hospital Stay (HOSPITAL_COMMUNITY)
Admission: RE | Admit: 2015-06-13 | Discharge: 2015-06-19 | DRG: 330 | Disposition: A | Discharge status: Home / Self Care | Payer: Medicaid Other | Source: Ambulatory Visit | Attending: Surgery | Admitting: Surgery

## 2015-06-13 DIAGNOSIS — I255 Ischemic cardiomyopathy: Secondary | ICD-10-CM | POA: Diagnosis present

## 2015-06-13 DIAGNOSIS — I5042 Chronic combined systolic (congestive) and diastolic (congestive) heart failure: Secondary | ICD-10-CM | POA: Diagnosis present

## 2015-06-13 DIAGNOSIS — Z95828 Presence of other vascular implants and grafts: Secondary | ICD-10-CM

## 2015-06-13 DIAGNOSIS — I2510 Atherosclerotic heart disease of native coronary artery without angina pectoris: Secondary | ICD-10-CM | POA: Diagnosis present

## 2015-06-13 DIAGNOSIS — Y842 Radiological procedure and radiotherapy as the cause of abnormal reaction of the patient, or of later complication, without mention of misadventure at the time of the procedure: Secondary | ICD-10-CM | POA: Diagnosis present

## 2015-06-13 DIAGNOSIS — K219 Gastro-esophageal reflux disease without esophagitis: Secondary | ICD-10-CM | POA: Diagnosis present

## 2015-06-13 DIAGNOSIS — I739 Peripheral vascular disease, unspecified: Secondary | ICD-10-CM | POA: Diagnosis present

## 2015-06-13 DIAGNOSIS — Z9889 Other specified postprocedural states: Secondary | ICD-10-CM

## 2015-06-13 DIAGNOSIS — C329 Malignant neoplasm of larynx, unspecified: Secondary | ICD-10-CM | POA: Diagnosis present

## 2015-06-13 DIAGNOSIS — Z8249 Family history of ischemic heart disease and other diseases of the circulatory system: Secondary | ICD-10-CM | POA: Diagnosis not present

## 2015-06-13 DIAGNOSIS — R682 Dry mouth, unspecified: Secondary | ICD-10-CM | POA: Diagnosis present

## 2015-06-13 DIAGNOSIS — Z955 Presence of coronary angioplasty implant and graft: Secondary | ICD-10-CM | POA: Diagnosis not present

## 2015-06-13 DIAGNOSIS — Z809 Family history of malignant neoplasm, unspecified: Secondary | ICD-10-CM | POA: Diagnosis not present

## 2015-06-13 DIAGNOSIS — Z951 Presence of aortocoronary bypass graft: Secondary | ICD-10-CM | POA: Diagnosis not present

## 2015-06-13 DIAGNOSIS — R338 Other retention of urine: Secondary | ICD-10-CM | POA: Diagnosis not present

## 2015-06-13 DIAGNOSIS — E785 Hyperlipidemia, unspecified: Secondary | ICD-10-CM | POA: Diagnosis present

## 2015-06-13 DIAGNOSIS — N9989 Other postprocedural complications and disorders of genitourinary system: Secondary | ICD-10-CM | POA: Diagnosis not present

## 2015-06-13 DIAGNOSIS — I252 Old myocardial infarction: Secondary | ICD-10-CM | POA: Diagnosis not present

## 2015-06-13 DIAGNOSIS — Z8674 Personal history of sudden cardiac arrest: Secondary | ICD-10-CM

## 2015-06-13 DIAGNOSIS — Z433 Encounter for attention to colostomy: Principal | ICD-10-CM

## 2015-06-13 DIAGNOSIS — Z87891 Personal history of nicotine dependence: Secondary | ICD-10-CM | POA: Diagnosis not present

## 2015-06-13 DIAGNOSIS — I110 Hypertensive heart disease with heart failure: Secondary | ICD-10-CM | POA: Diagnosis present

## 2015-06-13 DIAGNOSIS — K432 Incisional hernia without obstruction or gangrene: Secondary | ICD-10-CM | POA: Diagnosis present

## 2015-06-13 DIAGNOSIS — Z888 Allergy status to other drugs, medicaments and biological substances status: Secondary | ICD-10-CM

## 2015-06-13 HISTORY — PX: INCISIONAL HERNIA REPAIR: SHX193

## 2015-06-13 HISTORY — PX: COLOSTOMY TAKEDOWN: SHX5783

## 2015-06-13 HISTORY — PX: PERIPHERAL VASCULAR CATHETERIZATION: SHX172C

## 2015-06-13 HISTORY — PX: PORT-A-CATH REMOVAL: SHX5289

## 2015-06-13 SURGERY — CLOSURE, COLOSTOMY
Anesthesia: General | Site: Chest

## 2015-06-13 MED ORDER — CHLORHEXIDINE GLUCONATE CLOTH 2 % EX PADS: 6.0000 | MEDICATED_PAD | Freq: Once | CUTANEOUS | Status: DC

## 2015-06-13 MED ORDER — PROPOFOL 10 MG/ML IV BOLUS
INTRAVENOUS | Status: AC
  Filled 2015-06-13: qty 20

## 2015-06-13 MED ORDER — ROCURONIUM BROMIDE 50 MG/5ML IV SOLN
INTRAVENOUS | Status: AC
  Filled 2015-06-13: qty 1

## 2015-06-13 MED ORDER — SUCCINYLCHOLINE CHLORIDE 200 MG/10ML IV SOSY
PREFILLED_SYRINGE | INTRAVENOUS | Status: AC
  Filled 2015-06-13: qty 10

## 2015-06-13 MED ORDER — BUPROPION HCL 75 MG PO TABS
150.0000 mg | ORAL_TABLET | Freq: Two times a day (BID) | ORAL | Status: DC
  Administered 2015-06-13 – 2015-06-18 (×11): 150 mg via ORAL
  Filled 2015-06-13 (×11): qty 2

## 2015-06-13 MED ORDER — ETOMIDATE 2 MG/ML IV SOLN
INTRAVENOUS | Status: DC | PRN
  Administered 2015-06-13: 10 mg via INTRAVENOUS

## 2015-06-13 MED ORDER — PHENYLEPHRINE HCL 10 MG/ML IJ SOLN
10.0000 mg | INTRAMUSCULAR | Status: DC | PRN
  Administered 2015-06-13: 25 ug/min via INTRAVENOUS

## 2015-06-13 MED ORDER — ONDANSETRON HCL 4 MG/2ML IJ SOLN: 4.0000 mg | Freq: Four times a day (QID) | INTRAMUSCULAR | Status: DC | PRN

## 2015-06-13 MED ORDER — NALOXONE HCL 0.4 MG/ML IJ SOLN: 0.4000 mg | INTRAMUSCULAR | Status: DC | PRN

## 2015-06-13 MED ORDER — SUCCINYLCHOLINE CHLORIDE 20 MG/ML IJ SOLN
INTRAMUSCULAR | Status: DC | PRN
  Administered 2015-06-13: 100 mg via INTRAVENOUS

## 2015-06-13 MED ORDER — DIPHENHYDRAMINE HCL 12.5 MG/5ML PO ELIX: 12.5000 mg | ORAL_SOLUTION | Freq: Four times a day (QID) | ORAL | Status: DC | PRN

## 2015-06-13 MED ORDER — DEXAMETHASONE SODIUM PHOSPHATE 10 MG/ML IJ SOLN
INTRAMUSCULAR | Status: AC
  Filled 2015-06-13: qty 1

## 2015-06-13 MED ORDER — GLYCOPYRROLATE 0.2 MG/ML IJ SOLN
INTRAMUSCULAR | Status: DC | PRN
  Administered 2015-06-13: 0.6 mg via INTRAVENOUS

## 2015-06-13 MED ORDER — MORPHINE SULFATE 2 MG/ML IV SOLN
INTRAVENOUS | Status: AC
  Filled 2015-06-13: qty 25

## 2015-06-13 MED ORDER — FENTANYL CITRATE (PF) 250 MCG/5ML IJ SOLN
INTRAMUSCULAR | Status: AC
  Filled 2015-06-13: qty 5

## 2015-06-13 MED ORDER — MIDAZOLAM HCL 2 MG/2ML IJ SOLN
INTRAMUSCULAR | Status: AC
  Filled 2015-06-13: qty 2

## 2015-06-13 MED ORDER — LIDOCAINE HCL (CARDIAC) 20 MG/ML IV SOLN
INTRAVENOUS | Status: DC | PRN
  Administered 2015-06-13: 100 mg via INTRAVENOUS

## 2015-06-13 MED ORDER — ONDANSETRON HCL 4 MG/2ML IJ SOLN
INTRAMUSCULAR | Status: DC | PRN
  Administered 2015-06-13: 4 mg via INTRAVENOUS

## 2015-06-13 MED ORDER — MIDAZOLAM HCL 5 MG/5ML IJ SOLN
INTRAMUSCULAR | Status: DC | PRN
  Administered 2015-06-13: 2 mg via INTRAVENOUS

## 2015-06-13 MED ORDER — PROPOFOL 10 MG/ML IV BOLUS
INTRAVENOUS | Status: DC | PRN
  Administered 2015-06-13: 30 mg via INTRAVENOUS

## 2015-06-13 MED ORDER — ONDANSETRON HCL 4 MG/2ML IJ SOLN
INTRAMUSCULAR | Status: AC
  Filled 2015-06-13: qty 2

## 2015-06-13 MED ORDER — ONDANSETRON HCL 4 MG/2ML IJ SOLN
4.0000 mg | Freq: Four times a day (QID) | INTRAMUSCULAR | Status: DC | PRN
  Administered 2015-06-15: 4 mg via INTRAVENOUS
  Filled 2015-06-13: qty 2

## 2015-06-13 MED ORDER — 0.9 % SODIUM CHLORIDE (POUR BTL) OPTIME
TOPICAL | Status: DC | PRN
  Administered 2015-06-13: 3000 mL

## 2015-06-13 MED ORDER — ALVIMOPAN 12 MG PO CAPS
12.0000 mg | ORAL_CAPSULE | Freq: Once | ORAL | Status: AC
  Administered 2015-06-13: 12 mg via ORAL
  Filled 2015-06-13: qty 1

## 2015-06-13 MED ORDER — ENOXAPARIN SODIUM 40 MG/0.4ML ~~LOC~~ SOLN
40.0000 mg | SUBCUTANEOUS | Status: DC
  Administered 2015-06-14 – 2015-06-19 (×6): 40 mg via SUBCUTANEOUS
  Filled 2015-06-13 (×6): qty 0.4

## 2015-06-13 MED ORDER — PROCHLORPERAZINE EDISYLATE 5 MG/ML IJ SOLN: 10.0000 mg | Freq: Once | INTRAMUSCULAR | Status: DC

## 2015-06-13 MED ORDER — POTASSIUM CHLORIDE IN NACL 20-0.9 MEQ/L-% IV SOLN
INTRAVENOUS | Status: DC
Start: 2015-06-13 — End: 2015-06-18
  Administered 2015-06-13 – 2015-06-17 (×4): via INTRAVENOUS
  Filled 2015-06-13 (×9): qty 1000

## 2015-06-13 MED ORDER — ROCURONIUM BROMIDE 100 MG/10ML IV SOLN
INTRAVENOUS | Status: DC | PRN
  Administered 2015-06-13: 50 mg via INTRAVENOUS
  Administered 2015-06-13: 10 mg via INTRAVENOUS

## 2015-06-13 MED ORDER — FENTANYL CITRATE (PF) 100 MCG/2ML IJ SOLN
INTRAMUSCULAR | Status: DC | PRN
  Administered 2015-06-13 (×5): 50 ug via INTRAVENOUS

## 2015-06-13 MED ORDER — LIDOCAINE 2% (20 MG/ML) 5 ML SYRINGE
INTRAMUSCULAR | Status: AC
  Filled 2015-06-13: qty 5

## 2015-06-13 MED ORDER — ETOMIDATE 2 MG/ML IV SOLN
INTRAVENOUS | Status: AC
  Filled 2015-06-13: qty 10

## 2015-06-13 MED ORDER — SODIUM CHLORIDE 0.9% FLUSH: 9.0000 mL | INTRAVENOUS | Status: DC | PRN

## 2015-06-13 MED ORDER — DEXAMETHASONE SODIUM PHOSPHATE 10 MG/ML IJ SOLN
INTRAMUSCULAR | Status: DC | PRN
  Administered 2015-06-13: 10 mg via INTRAVENOUS

## 2015-06-13 MED ORDER — GLYCOPYRROLATE 0.2 MG/ML IV SOSY
PREFILLED_SYRINGE | INTRAVENOUS | Status: AC
  Filled 2015-06-13: qty 3

## 2015-06-13 MED ORDER — DIPHENHYDRAMINE HCL 50 MG/ML IJ SOLN: 12.5000 mg | Freq: Four times a day (QID) | INTRAMUSCULAR | Status: DC | PRN

## 2015-06-13 MED ORDER — MORPHINE SULFATE 2 MG/ML IV SOLN
INTRAVENOUS | Status: DC
  Administered 2015-06-13: 4.5 mg via INTRAVENOUS
  Administered 2015-06-13: 14:00:00 via INTRAVENOUS
  Administered 2015-06-14: 15 mg via INTRAVENOUS
  Administered 2015-06-14: 10 mg via INTRAVENOUS
  Administered 2015-06-14: 13:00:00 via INTRAVENOUS
  Administered 2015-06-14: 4.5 mg via INTRAVENOUS
  Administered 2015-06-14: 1.5 mg via INTRAVENOUS
  Administered 2015-06-14 (×2): 4.5 mg via INTRAVENOUS
  Administered 2015-06-15: 9 mg via INTRAVENOUS
  Administered 2015-06-15 (×3): 1.5 mg via INTRAVENOUS
  Administered 2015-06-15 – 2015-06-16 (×3): 6 mg via INTRAVENOUS
  Administered 2015-06-16: 10.5 mg via INTRAVENOUS
  Administered 2015-06-16: 6 mg via INTRAVENOUS
  Administered 2015-06-16: 7.5 mg via INTRAVENOUS
  Administered 2015-06-16: 3 mg via INTRAVENOUS
  Administered 2015-06-16: 1.5 mg via INTRAVENOUS
  Administered 2015-06-17: 4.5 mg via INTRAVENOUS
  Administered 2015-06-17: 3 mg via INTRAVENOUS
  Administered 2015-06-17: 7.5 mg via INTRAVENOUS
  Filled 2015-06-13 (×2): qty 25

## 2015-06-13 MED ORDER — CARVEDILOL 6.25 MG PO TABS
6.2500 mg | ORAL_TABLET | Freq: Two times a day (BID) | ORAL | Status: DC
  Administered 2015-06-13 – 2015-06-19 (×12): 6.25 mg via ORAL
  Filled 2015-06-13 (×12): qty 1

## 2015-06-13 MED ORDER — HYDROMORPHONE HCL 1 MG/ML IJ SOLN
0.2500 mg | INTRAMUSCULAR | Status: DC | PRN
  Administered 2015-06-13: 0.5 mg via INTRAVENOUS
  Administered 2015-06-13 (×2): 0.25 mg via INTRAVENOUS

## 2015-06-13 MED ORDER — NEOSTIGMINE METHYLSULFATE 10 MG/10ML IV SOLN
INTRAVENOUS | Status: DC | PRN
  Administered 2015-06-13: 4 mg via INTRAVENOUS

## 2015-06-13 MED ORDER — PHENYLEPHRINE 40 MCG/ML (10ML) SYRINGE FOR IV PUSH (FOR BLOOD PRESSURE SUPPORT)
PREFILLED_SYRINGE | INTRAVENOUS | Status: AC
  Filled 2015-06-13: qty 10

## 2015-06-13 MED ORDER — ALVIMOPAN 12 MG PO CAPS
12.0000 mg | ORAL_CAPSULE | Freq: Two times a day (BID) | ORAL | Status: DC
  Administered 2015-06-14 – 2015-06-15 (×4): 12 mg via ORAL
  Filled 2015-06-13 (×4): qty 1

## 2015-06-13 MED ORDER — ONDANSETRON HCL 4 MG PO TABS: 4.0000 mg | ORAL_TABLET | Freq: Four times a day (QID) | ORAL | Status: DC | PRN

## 2015-06-13 MED ORDER — LACTATED RINGERS IV SOLN
INTRAVENOUS | Status: DC
  Administered 2015-06-13 (×2): via INTRAVENOUS

## 2015-06-13 MED ORDER — NEOSTIGMINE METHYLSULFATE 5 MG/5ML IV SOSY
PREFILLED_SYRINGE | INTRAVENOUS | Status: AC
  Filled 2015-06-13: qty 5

## 2015-06-13 MED ORDER — HYDROMORPHONE HCL 1 MG/ML IJ SOLN
INTRAMUSCULAR | Status: AC
  Filled 2015-06-13: qty 1

## 2015-06-13 SURGICAL SUPPLY — 65 items
BLADE SURG 15 STRL LF DISP TIS (BLADE) ×2 IMPLANT
BLADE SURG 15 STRL SS (BLADE) ×2
BLADE SURG ROTATE 9660 (MISCELLANEOUS) IMPLANT
CANISTER SUCTION 2500CC (MISCELLANEOUS) ×4 IMPLANT
CHLORAPREP W/TINT 26ML (MISCELLANEOUS) ×4 IMPLANT
COVER MAYO STAND STRL (DRAPES) ×8 IMPLANT
COVER SURGICAL LIGHT HANDLE (MISCELLANEOUS) ×4 IMPLANT
DRAPE LAPAROSCOPIC ABDOMINAL (DRAPES) ×4 IMPLANT
DRAPE PROXIMA HALF (DRAPES) ×8 IMPLANT
DRAPE UTILITY XL STRL (DRAPES) ×8 IMPLANT
DRAPE WARM FLUID 44X44 (DRAPE) ×4 IMPLANT
DRSG MEPILEX BORDER 4X12 (GAUZE/BANDAGES/DRESSINGS) IMPLANT
DRSG MEPILEX BORDER 4X8 (GAUZE/BANDAGES/DRESSINGS) IMPLANT
DRSG OPSITE POSTOP 4X10 (GAUZE/BANDAGES/DRESSINGS) ×4 IMPLANT
DRSG OPSITE POSTOP 4X6 (GAUZE/BANDAGES/DRESSINGS) ×4 IMPLANT
DRSG OPSITE POSTOP 4X8 (GAUZE/BANDAGES/DRESSINGS) ×4 IMPLANT
ELECT BLADE 6.5 EXT (BLADE) ×4 IMPLANT
ELECT CAUTERY BLADE 6.4 (BLADE) ×8 IMPLANT
ELECT REM PT RETURN 9FT ADLT (ELECTROSURGICAL) ×4
ELECTRODE REM PT RTRN 9FT ADLT (ELECTROSURGICAL) ×2 IMPLANT
GLOVE BIO SURGEON STRL SZ7 (GLOVE) ×4 IMPLANT
GLOVE BIOGEL PI IND STRL 7.5 (GLOVE) ×2 IMPLANT
GLOVE BIOGEL PI INDICATOR 7.5 (GLOVE) ×2
GLOVE ECLIPSE 7.5 STRL STRAW (GLOVE) ×4 IMPLANT
GLOVE SURG SIGNA 7.5 PF LTX (GLOVE) ×12 IMPLANT
GOWN STRL REUS W/ TWL LRG LVL3 (GOWN DISPOSABLE) ×8 IMPLANT
GOWN STRL REUS W/ TWL XL LVL3 (GOWN DISPOSABLE) ×4 IMPLANT
GOWN STRL REUS W/TWL LRG LVL3 (GOWN DISPOSABLE) ×8
GOWN STRL REUS W/TWL XL LVL3 (GOWN DISPOSABLE) ×4
KIT BASIN OR (CUSTOM PROCEDURE TRAY) ×4 IMPLANT
KIT ROOM TURNOVER OR (KITS) ×4 IMPLANT
LEGGING LITHOTOMY PAIR STRL (DRAPES) IMPLANT
LIGASURE IMPACT 36 18CM CVD LR (INSTRUMENTS) ×4 IMPLANT
LIQUID BAND (GAUZE/BANDAGES/DRESSINGS) ×4 IMPLANT
NS IRRIG 1000ML POUR BTL (IV SOLUTION) ×8 IMPLANT
PACK GENERAL/GYN (CUSTOM PROCEDURE TRAY) ×4 IMPLANT
PAD ARMBOARD 7.5X6 YLW CONV (MISCELLANEOUS) ×4 IMPLANT
PENCIL BUTTON HOLSTER BLD 10FT (ELECTRODE) ×4 IMPLANT
SPECIMEN JAR MEDIUM (MISCELLANEOUS) ×4 IMPLANT
SPONGE LAP 18X18 X RAY DECT (DISPOSABLE) IMPLANT
STAPLER CIRC CVD 29MM 37CM (STAPLE) ×4 IMPLANT
STAPLER VISISTAT 35W (STAPLE) ×4 IMPLANT
SUCTION POOLE TIP (SUCTIONS) ×4 IMPLANT
SURGILUBE 2OZ TUBE FLIPTOP (MISCELLANEOUS) IMPLANT
SUT MNCRL AB 4-0 PS2 18 (SUTURE) ×4 IMPLANT
SUT NOVA 1 T20/GS 25DT (SUTURE) ×8 IMPLANT
SUT PDS AB 1 TP1 96 (SUTURE) ×8 IMPLANT
SUT PROLENE 2 0 CT2 30 (SUTURE) IMPLANT
SUT PROLENE 2 0 KS (SUTURE) IMPLANT
SUT PROLENE 3 0 SH 48 (SUTURE) ×4 IMPLANT
SUT SILK 2 0 SH CR/8 (SUTURE) ×4 IMPLANT
SUT SILK 2 0 TIES 10X30 (SUTURE) ×4 IMPLANT
SUT SILK 3 0 SH CR/8 (SUTURE) ×8 IMPLANT
SUT SILK 3 0 TIES 10X30 (SUTURE) ×4 IMPLANT
SUT VIC AB 2-0 SH 27 (SUTURE) ×2
SUT VIC AB 2-0 SH 27X BRD (SUTURE) ×2 IMPLANT
SUT VIC AB 3-0 SH 8-18 (SUTURE) IMPLANT
SYR BULB IRRIGATION 50ML (SYRINGE) ×4 IMPLANT
TOWEL OR 17X26 10 PK STRL BLUE (TOWEL DISPOSABLE) ×8 IMPLANT
TRAY FOLEY CATH 14FRSI W/METER (CATHETERS) IMPLANT
TRAY PROCTOSCOPIC FIBER OPTIC (SET/KITS/TRAYS/PACK) IMPLANT
TUBE CONNECTING 12'X1/4 (SUCTIONS) ×1
TUBE CONNECTING 12X1/4 (SUCTIONS) ×3 IMPLANT
WATER STERILE IRR 1000ML POUR (IV SOLUTION) IMPLANT
YANKAUER SUCT BULB TIP NO VENT (SUCTIONS) ×4 IMPLANT

## 2015-06-13 NOTE — Progress Notes (Signed)
Pt admitted to 6N30 via stretcher from PACU.  Pt AAO X 4.  Pt on 2L O2 via [place].  Pt has MLA incision and incision to the the with staples, honeycomb, and transparent dressing.  Pt has 18G to rt hand with fluids infusing and full dose Morphine PCA.  Foley intact draining clear yellow urine.  SCDs in place.  Report rcvd from [place], [place].  Family waiting in the waiting area on the floor to see the pt.

## 2015-06-13 NOTE — Anesthesia Preprocedure Evaluation (Addendum)
Anesthesia Evaluation  Patient identified by MRN, date of birth, ID band Patient awake    Reviewed: Allergy & Precautions, NPO status , Patient's Chart, lab work & pertinent test results  History of Anesthesia Complications (+) DIFFICULT AIRWAY  Airway Mallampati: III  TM Distance: <3 FB Neck ROM: Full    Dental no notable dental hx.    Pulmonary neg pulmonary ROS, former smoker,    Pulmonary exam normal breath sounds clear to auscultation       Cardiovascular hypertension, + CAD, + Past MI, + Cardiac Stents, + CABG and + Peripheral Vascular Disease  Normal cardiovascular exam Rhythm:Regular Rate:Normal  Left ventricle: The cavity size was normal. Wall thickness was normal. Systolic function was normal. The estimated ejection fraction was in the range of 50% to 55%. Regional wall motion abnormalities: There is hypokinesis of the mid-apicalanteroseptal myocardium. The transmitral flow pattern was normal. The deceleration time of the early transmitral flow velocity was normal. The pulmonary vein flow pattern was normal. The tissue Doppler parameters were normal. Left ventricular diastolic function parameters were normal.  ------------------------------------------------------------------- Aortic valve: Structurally normal valve. Cusp separation was normal. Doppler: Transvalvular velocity was within the normal range. There was no stenosis. There was no regurgitation.  ------------------------------------------------------------------- Aorta: The aorta was normal, not dilated, and non-diseased.  ------------------------------------------------------------------- Mitral valve: Structurally normal valve. Leaflet separation was normal. Doppler: Transvalvular velocity was within the normal range. There was no evidence for stenosis. There was no regurgitation. Peak gradient (D): 3 mm Hg.    Neuro/Psych negative neurological ROS  negative psych ROS   GI/Hepatic negative GI ROS, Neg liver ROS,   Endo/Other  negative endocrine ROS  Renal/GU negative Renal ROS  negative genitourinary   Musculoskeletal negative musculoskeletal ROS (+)   Abdominal   Peds negative pediatric ROS (+)  Hematology negative hematology ROS (+)   Anesthesia Other Findings   Reproductive/Obstetrics negative OB ROS                            Anesthesia Physical Anesthesia Plan  ASA: IV  Anesthesia Plan: General   Post-op Pain Management:    Induction: Intravenous  Airway Management Planned: Oral ETT and Video Laryngoscope Planned  Additional Equipment: Arterial line  Intra-op Plan:   Post-operative Plan: Extubation in OR  Informed Consent: I have reviewed the patients History and Physical, chart, labs and discussed the procedure including the risks, benefits and alternatives for the proposed anesthesia with the patient or authorized representative who has indicated his/her understanding and acceptance.   Dental advisory given  Plan Discussed with: CRNA and Surgeon  Anesthesia Plan Comments:         Anesthesia Quick Evaluation

## 2015-06-13 NOTE — Anesthesia Postprocedure Evaluation (Signed)
Anesthesia Post Note  Patient: [name]  Procedure(s) Performed: Procedure(s) (LRB): COLOSTOMY TAKEDOWN (N/A) PORTA CATH REMOVAL  Patient location during evaluation: PACU Anesthesia Type: General Level of consciousness: awake and alert Pain management: pain level controlled Vital Signs Assessment: post-procedure vital signs reviewed and stable Respiratory status: spontaneous breathing, nonlabored ventilation, respiratory function stable and patient connected to nasal cannula oxygen Cardiovascular status: blood pressure returned to baseline and stable Postop Assessment: no signs of nausea or vomiting Anesthetic complications: no    Last Vitals:  Filed Vitals:   06/13/15 1400 06/13/15 1415  BP: 138/81 144/80  Pulse: 63 61  Temp:    Resp: 5 8    Last Pain:  Filed Vitals:   06/13/15 1428  PainSc: Asleep                 Tashena Ibach S

## 2015-06-13 NOTE — Anesthesia Procedure Notes (Signed)
Procedure Name: Intubation Date/Time: 06/13/2015 11:40 AM Performed by: Judeth Cornfield T Pre-anesthesia Checklist: Patient identified, Emergency Drugs available, Suction available, Patient being monitored and Timeout performed Patient Re-evaluated:Patient Re-evaluated prior to inductionOxygen Delivery Method: Circle system utilized Preoxygenation: Pre-oxygenation with 100% oxygen Intubation Type: IV induction Ventilation: Mask ventilation without difficulty Laryngoscope Size: Glidescope and 3 Grade View: Grade I Tube type: Oral Tube size: 7.5 mm Number of attempts: 1 Airway Equipment and Method: Stylet and Video-laryngoscopy Placement Confirmation: ETT inserted through vocal cords under direct vision,  positive ETCO2 and breath sounds checked- equal and bilateral Secured at: 22 cm Tube secured with: Tape Dental Injury: Teeth and Oropharynx as per pre-operative assessment

## 2015-06-13 NOTE — Op Note (Signed)
NAME:  [name] NO.:  [id number]  MEDICAL RECORD NO.:  [id number]  LOCATION:  [facility]                         FACILITY:  [facility]  PHYSICIAN:  Coralie Keens, M.D. DATE OF BIRTH:  Mar 18, 1955  DATE OF PROCEDURE:  06/13/2015 DATE OF DISCHARGE:                              OPERATIVE REPORT   PREOPERATIVE DIAGNOSES: 1. Colostomy in place. 2. Incisional hernia. 3. Port-A-Cath in place.  POSTOPERATIVE DIAGNOSIS: 1. Colostomy in place. 2. Incisional hernia. 3. Port-A-Cath in place.  PROCEDURES: 1. Colostomy takedown. 2. Repair of incisional hernia. 3. Port-A-Cath removal.  SURGEON:  Coralie Keens, M.D.  ASSISTANT:  Judyann Munson, RNFA.  ANESTHESIA:  General endotracheal anesthesia.  ESTIMATED BLOOD LOSS:  Minimal.  INDICATIONS:  This is a 60 year old gentleman who presented with perforated diverticulitis last year.  This was while he was undergoing treatment for laryngeal cancer.  He had to undergo a resection with an emergent colostomy.  He now presents for colostomy takedown and colon reanastomosis.  He has developed an incisional hernia around the ostomy which will be repaired and his Port-A-Cath is no longer needed so this will be removed.  PROCEDURE IN DETAIL:  The patient was brought to the operating room, identified as [name].  He was placed supine on the operating room table and general anesthesia was induced.  A Foley catheter was inserted.  I closed the ostomy site with a running interlocked 3-0 silk suture.  The patient was then placed in lithotomy position.  His abdomen was then prepped and draped in usual sterile fashion.  I excised the previous scar with a scalpel.  I took this down to the fascia and then completely excised the remaining scar.  I then opened the fascia to the entire length of the incision.  The patient had omentum stuck to the peritoneum circumferentially around the incision but this easily came down  with electrocautery.  The patient had minimal intra-abdominal adhesions other than the omentum.  I was able to easily identify the rectal stump with the Prolene suture that had been placed there which I removed.  There was a good length of the rectal stump.  The ostomy was completely freed up circumferentially as well intra-abdominally.  At this point, I made an elliptical incision around the ostomy at the skin and took this down to the fascia circumferentially around the ostomy.  I did this with a cautery.  I was then able to reduce the ostomy back into the abdominal cavity.  I then excised the ostomy by taking down the mesentery with clamps and silk ties.  I then transected the colon proximal to the ostomy with the cautery.  I grasped the open end with Allis clamps.  I then used sizers to determine that a 29 EEA size stapling device would work well.  A 29 EEA stapler was brought onto the field.  I placed the anvil in the open end of the proximal colon.  I then placed a 3-0 Prolene pursestring suture around this.  I then tied it in place cinching the colon up around the ends of the stapling device.  Next, the rectal stump had to be manipulated.  I was able to pass  sizers up the anus to the end of the staple line.  I then brought the 29 EEA stapler to the proximal end and placed into the anus and manipulated up toward the staple line.  At this point, there was a significant tear in the serosa of the colon several inches distal to the staple line.  At this point, I decided to bring out the ends of the stapling device at this serosal tear which was on the anterior surface. At this point, the spike was brought out under direct vision right through this area.  I then attached the anvil to the spike and slowly closed the stapling device bringing the proximal colon down to the rectum.  The stapling device was then fired creating an end to anterior side anastomosis.  I then checked the donuts and  both were found to be intact.  At this point, I decided to go ahead and reinforce the staple line as the bowel seemed thin circumferentially with interrupted 3-0 silk sutures.  I then placed a bowel clamp.  The proctoscope was inserted into the anal canal and insufflation was instituted insufflating the anastomosis.  The abdomen had been filled with saline. I saw no evidence of leak as the bowel distended well.  We then relieved the distention and removed the clamp.  Again, the anastomosis appeared pink and well perfused.  I brought a piece of floppy epiploica over the top of the anastomosis which lay there well without any suturing.  At this point, I irrigated the abdomen with several liters of normal saline.  Hemostasis appeared to be achieved.  We then removed the surrounding towels and placed new sterile towels.  I then closed the patient's incisional hernia and ostomy site incision with several figure- of-eight #1 Novafil sutures in 2 layers.  Good closure of the ostomy site and incisional hernia appeared to be achieved.  I then was able to close the patient's midline incision with a running #1 looped PDS suture.  Both incisions were then irrigated and closed with skin staples.  A gauze was then placed over this.  The patient's right upper chest then prepped over the Port-A-Cath site.  I made an incision with a scalpel through the previous scar and took this down to the port.  I easily removed the sutures and was able to dissect around the cuff of the port and then remove the port and the catheter in its entirety from the central venous system.  I then used a figure-of-eight 2-0 Vicryl suture to close the tract in the skin to the vein.  I then closed the subcutaneous tissue with interrupted 3-0 Vicryl sutures and closed the skin with a running 4-0 Monocryl.  Skin glue was then applied.  The patient tolerated the procedure well.  All sponge, needle, and instrument counts were correct  at the end of the procedure.  The patient was then extubated in the operating room and taken in a stable condition to the recovery room.     Coralie Keens, M.D.     DB/MEDQ  D:  06/13/2015  T:  06/13/2015  Job:  [id number]

## 2015-06-13 NOTE — Op Note (Signed)
COLOSTOMY TAKEDOWN, PORTA CATH REMOVAL  Procedure Note  [name] 06/13/2015   Pre-op Diagnosis: Colostomy in place , incisional hernia, port-a-cath in place     Post-op Diagnosis: same  Procedure(s): COLOSTOMY TAKEDOWN REPAIR INCISIONAL HERNIA PORTA CATH REMOVAL  Surgeon(s): Coralie Keens, MD  Anesthesia: General  Staff:  Circulator: Milas Kocher, RN Relief Circulator: Candie Mile, RN Scrub Person: Rolan Bucco Circulator Assistant: Hessie Dibble, RN RN First Assistant: Cyd Silence, RN  Estimated Blood Loss: Minimal               Specimens: ostomy sent to path          [facility] A   Date: 06/13/2015  Time: 1:34 PM

## 2015-06-13 NOTE — Interval H&P Note (Signed)
History and Physical Interval Note: no change in H and P.  Will also remove his port-[name]-cath at his and oncology's request.  His ventral hernia will be primarily repaired  06/13/2015 11:18 AM  [name]  has presented today for surgery, with the diagnosis of Colostomy in place   The various methods of treatment have been discussed with the patient and family. After consideration of risks, benefits and other options for treatment, the patient has consented to  Procedure(s): COLOSTOMY TAKEDOWN (N/[name]) as [name] surgical intervention .  The patient's history has been reviewed, patient examined, no change in status, stable for surgery.  I have reviewed the patient's chart and labs.  Questions were answered to the patient's satisfaction.     [name]

## 2015-06-13 NOTE — Transfer of Care (Signed)
Immediate Anesthesia Transfer of Care Note  Patient: [name]  Procedure(s) Performed: Procedure(s): COLOSTOMY TAKEDOWN (N/A) PORTA CATH REMOVAL  Patient Location: PACU  Anesthesia Type:General  Level of Consciousness: awake, patient cooperative and responds to stimulation  Airway & Oxygen Therapy: Patient Spontanous Breathing and Patient connected to nasal cannula oxygen  Post-op Assessment: Report given to RN and Post -op Vital signs reviewed and stable  Post vital signs: Reviewed and stable  Last Vitals:  Filed Vitals:   06/13/15 1024 06/13/15 1345  BP: 118/82   Pulse: 70 77  Temp: 36.5 C 36.4 C  Resp: 20 13    Last Pain: There were no vitals filed for this visit.    Patients Stated Pain Goal: 3 ([id number])  Complications: No apparent anesthesia complications

## 2015-06-14 ENCOUNTER — Encounter (HOSPITAL_COMMUNITY): Payer: Self-pay | Admitting: Surgery

## 2015-06-14 LAB — BASIC METABOLIC PANEL
ANION GAP: 4 — AB (ref 5–15)
BUN: 10 mg/dL (ref 6–20)
CALCIUM: 8.6 mg/dL — AB (ref 8.9–10.3)
CO2: 26 mmol/L (ref 22–32)
Chloride: 105 mmol/L (ref 101–111)
Creatinine, Ser: 1.11 mg/dL (ref 0.61–1.24)
GLUCOSE: 165 mg/dL — AB (ref 65–99)
Potassium: 4.8 mmol/L (ref 3.5–5.1)
SODIUM: 135 mmol/L (ref 135–145)

## 2015-06-14 LAB — CBC
HCT: 36 % — ABNORMAL LOW (ref 39.0–52.0)
HEMOGLOBIN: 11.5 g/dL — AB (ref 13.0–17.0)
MCH: 30.4 pg (ref 26.0–34.0)
MCHC: 31.9 g/dL (ref 30.0–36.0)
MCV: 95.2 fL (ref 78.0–100.0)
Platelets: 254 10*3/uL (ref 150–400)
RBC: 3.78 MIL/uL — ABNORMAL LOW (ref 4.22–5.81)
RDW: 14.9 % (ref 11.5–15.5)
WBC: 22.5 10*3/uL — AB (ref 4.0–10.5)

## 2015-06-14 MED ORDER — ALPRAZOLAM 0.5 MG PO TABS
0.5000 mg | ORAL_TABLET | Freq: Three times a day (TID) | ORAL | Status: DC | PRN
  Administered 2015-06-14 – 2015-06-19 (×8): 0.5 mg via ORAL
  Filled 2015-06-14 (×8): qty 1

## 2015-06-14 NOTE — Progress Notes (Signed)
Pt. Stated he did not have an appetite for lunch and therefore did not really eat any clear liquids. He had a few sips of drink. Earlier in the day, the nurse tech said he said he felt dizzy and did not want to walk around in hall. At 1600, I strongly encourage pt. To walk. He tolerated well, only some weakness and soreness with walking. He did not want to walk very much or far.

## 2015-06-14 NOTE — Progress Notes (Signed)
1 Day Post-Op  Subjective: Moderately sore but doing ok  Objective: Vital signs in last 24 hours: Temp:  [97.6 F (36.4 C)-98.4 F (36.9 C)] 98.4 F (36.9 C) (06/01 0945) Pulse Rate:  [59-100] 93 (06/01 0945) Resp:  [5-20] 16 (06/01 0945) BP: (100-161)/(59-93) 131/72 mmHg (06/01 0945) SpO2:  [95 %-100 %] 98 % (06/01 0945) Arterial Line BP: (157-167)/(72-75) 164/74 mmHg (05/31 1430) Weight:  [75.297 kg (166 lb)] 75.297 kg (166 lb) (05/31 1024)    Intake/Output from previous day: 05/31 0701 - 06/01 0700 In: 3274.3 [P.O.:90; I.V.:3184.3] Out: 890 [Urine:880; Blood:10] Intake/Output this shift:    Lungs clear Abdomen soft, minimally full, dressing dry  Lab Results:   Recent Labs  06/14/15 0245  WBC 22.5*  HGB 11.5*  HCT 36.0*  PLT 254   BMET  Recent Labs  06/14/15 0245  NA 135  K 4.8  CL 105  CO2 26  GLUCOSE 165*  BUN 10  CREATININE 1.11  CALCIUM 8.6*   PT/INR No results for input(s): LABPROT, INR in the last 72 hours. ABG No results for input(s): PHART, HCO3 in the last 72 hours.  Invalid input(s): PCO2, PO2  Studies/Results: No results found.  Anti-infectives: Anti-infectives    Start     Dose/Rate Route Frequency Ordered Stop   06/13/15 1130  metroNIDAZOLE (FLAGYL) IVPB 500 mg     500 mg 100 mL/hr over 60 Minutes Intravenous To ShortStay Surgical 06/12/15 1426 06/13/15 1155   06/13/15 1100  ceFAZolin (ANCEF) IVPB 2g/100 mL premix     2 g 200 mL/hr over 30 Minutes Intravenous To ShortStay Surgical 06/12/15 1426 06/13/15 1210      Assessment/Plan: s/p Procedure(s): COLOSTOMY TAKEDOWN (N/A) PORTA CATH REMOVAL  Start clear liquids Ambulate Foley removed  LOS: 1 day    [name] A 06/14/2015

## 2015-06-15 MED ORDER — PANTOPRAZOLE SODIUM 40 MG PO TBEC
40.0000 mg | DELAYED_RELEASE_TABLET | Freq: Every day | ORAL | Status: DC
  Administered 2015-06-15: 40 mg via ORAL
  Filled 2015-06-15: qty 1

## 2015-06-15 NOTE — Progress Notes (Signed)
Pt. Requesting heartburn medication. Notified Dr. Redmond Pulling. Protonix ordered daily.

## 2015-06-15 NOTE — Progress Notes (Signed)
2 Days Post-Op  Subjective: Comfortable Tolerating clears No flatus Denies nausea Foley replaced for urinary retention  Objective: Vital signs in last 24 hours: Temp:  [97.7 F (36.5 C)-98.7 F (37.1 C)] 98.1 F (36.7 C) (06/02 0509) Pulse Rate:  [72-93] 76 (06/02 0509) Resp:  [16-20] 16 (06/02 0509) BP: (109-132)/(59-72) 132/65 mmHg (06/02 0509) SpO2:  [94 %-99 %] 94 % (06/02 0509) Weight:  [75.297 kg (166 lb)] 75.297 kg (166 lb) (06/01 2115)    Intake/Output from previous day: 06/01 0701 - 06/02 0700 In: 1590 [P.O.:540; I.V.:1050] Out: 1325 [Urine:1325] Intake/Output this shift: Total I/O In: 1110 [P.O.:60; I.V.:1050] Out: 750 [Urine:750]  Lungs clear CV RRR Abd soft, +BS  Lab Results:   Recent Labs  06/14/15 0245  WBC 22.5*  HGB 11.5*  HCT 36.0*  PLT 254   BMET  Recent Labs  06/14/15 0245  NA 135  K 4.8  CL 105  CO2 26  GLUCOSE 165*  BUN 10  CREATININE 1.11  CALCIUM 8.6*   PT/INR No results for input(s): LABPROT, INR in the last 72 hours. ABG No results for input(s): PHART, HCO3 in the last 72 hours.  Invalid input(s): PCO2, PO2  Studies/Results: No results found.  Anti-infectives: Anti-infectives    Start     Dose/Rate Route Frequency Ordered Stop   06/13/15 1130  metroNIDAZOLE (FLAGYL) IVPB 500 mg     500 mg 100 mL/hr over 60 Minutes Intravenous To ShortStay Surgical 06/12/15 1426 06/13/15 1155   06/13/15 1100  ceFAZolin (ANCEF) IVPB 2g/100 mL premix     2 g 200 mL/hr over 30 Minutes Intravenous To ShortStay Surgical 06/12/15 1426 06/13/15 1210      Assessment/Plan: s/p Procedure(s): COLOSTOMY TAKEDOWN (N/A) PORTA CATH REMOVAL  Postop urinary retention.  Foley replaced.  Will leave until tomorrow Keep of clear liquids Decrease IVF  LOS: 2 days    Trystyn Sitts A 06/15/2015

## 2015-06-16 MED ORDER — BIOTENE DRY MOUTH MT LIQD: 15.0000 mL | OROMUCOSAL | Status: DC | PRN

## 2015-06-16 MED ORDER — PANTOPRAZOLE SODIUM 40 MG PO PACK
40.0000 mg | PACK | Freq: Every day | ORAL | Status: DC
  Administered 2015-06-16 – 2015-06-18 (×3): 40 mg
  Filled 2015-06-16 (×4): qty 20

## 2015-06-16 MED ORDER — CETYLPYRIDINIUM CHLORIDE 0.05 % MT LIQD
7.0000 mL | Freq: Two times a day (BID) | OROMUCOSAL | Status: DC
  Administered 2015-06-16 – 2015-06-18 (×6): 7 mL via OROMUCOSAL

## 2015-06-16 MED ORDER — BIOTENE DRY MOUTH MT LIQD
15.0000 mL | OROMUCOSAL | Status: DC | PRN
  Administered 2015-06-16 – 2015-06-17 (×2): 15 mL via OROMUCOSAL
  Filled 2015-06-16 (×3): qty 15

## 2015-06-16 NOTE — Progress Notes (Signed)
3 Days Post-Op  Subjective: Feels better. Passing flatus and had a stool. Was nauseated yesterday but not now Pain well controlled Afebrile.  Excellent urine output.  IV at 75 mL per hour.  Foley catheter reinserted yesterday for urinary retention  Objective: Vital signs in last 24 hours: Temp:  [98.2 F (36.8 C)-98.6 F (37 C)] 98.4 F (36.9 C) (06/03 0504) Pulse Rate:  [80-96] 84 (06/03 0504) Resp:  [12-18] 12 (06/03 0504) BP: (110-151)/(62-79) 137/62 mmHg (06/03 0504) SpO2:  [93 %-98 %] 93 % (06/03 0504)    Intake/Output from previous day: 06/02 0701 - 06/03 0700 In: 2280 [P.O.:1280; I.V.:1000] Out: 2350 [Urine:2350] Intake/Output this shift: Total I/O In: 1440 [P.O.:440; I.V.:1000] Out: 1450 [Urine:1450]  General appearance: Alert.  Cooperative.  No distress. Resp: clear to auscultation bilaterally GI: Soft.  Bowel sounds present.  Wounds clean.  Borderline distended.  Minimal tenderness.  Lab Results:  No results found for this or any previous visit (from the past 24 hour(s)).   Studies/Results: No results found.  Marland Kitchen alvimopan  12 mg Oral BID  . buPROPion  150 mg Oral BID  . carvedilol  6.25 mg Oral BID  . enoxaparin (LOVENOX) injection  40 mg Subcutaneous Q24H  . morphine   Intravenous Q4H  . pantoprazole  40 mg Oral Daily     Assessment/Plan: s/p Procedure(s): COLOSTOMY TAKEDOWN PORTA CATH REMOVAL   POD #3.  Colostomy takedown, repair ventral hernia, removal Port-A-Cath Ileus starting to resolve Full liquids as tolerated Entereg Lovenox for DVT prophylaxis Ambulate more  Pathology confirms benign colostomy.  Postop urinary retention.  No prior voiding problems.  Foley out tomorrow  Dry mouth secondary to radiation therapy for laryngeal cancer. Biotene ordered.  @PROBHOSP @  LOS: 3 days    Gilverto Dileonardo M 06/16/2015  . .prob

## 2015-06-16 NOTE — Progress Notes (Signed)
Pt with dry mouth with his history of throat cancer and uses biotene at home.  Called and spoke with Modena Jansky, [place] and got order for biotene.

## 2015-06-17 LAB — CBC
HEMATOCRIT: 31.8 % — AB (ref 39.0–52.0)
HEMOGLOBIN: 10.1 g/dL — AB (ref 13.0–17.0)
MCH: 30.1 pg (ref 26.0–34.0)
MCHC: 31.8 g/dL (ref 30.0–36.0)
MCV: 94.9 fL (ref 78.0–100.0)
Platelets: 248 10*3/uL (ref 150–400)
RBC: 3.35 MIL/uL — ABNORMAL LOW (ref 4.22–5.81)
RDW: 14.5 % (ref 11.5–15.5)
WBC: 6.9 10*3/uL (ref 4.0–10.5)

## 2015-06-17 LAB — BASIC METABOLIC PANEL
ANION GAP: 9 (ref 5–15)
BUN: 9 mg/dL (ref 6–20)
CHLORIDE: 100 mmol/L — AB (ref 101–111)
CO2: 25 mmol/L (ref 22–32)
Calcium: 8.4 mg/dL — ABNORMAL LOW (ref 8.9–10.3)
Creatinine, Ser: 0.95 mg/dL (ref 0.61–1.24)
GFR calc non Af Amer: >60 mL/min (ref >60)
Glucose, Bld: 84 mg/dL (ref 65–99)
POTASSIUM: 4 mmol/L (ref 3.5–5.1)
SODIUM: 134 mmol/L — AB (ref 135–145)

## 2015-06-17 MED ORDER — HYDROCODONE-ACETAMINOPHEN 7.5-325 MG/15ML PO SOLN
10.0000 mL | ORAL | Status: DC | PRN
Start: 2015-06-17 — End: 2015-06-19
  Administered 2015-06-17 – 2015-06-19 (×6): 10 mL via ORAL
  Filled 2015-06-17 (×6): qty 15

## 2015-06-17 MED ORDER — MORPHINE SULFATE (PF) 2 MG/ML IV SOLN
1.0000 mg | INTRAVENOUS | Status: DC | PRN
  Administered 2015-06-17 – 2015-06-18 (×3): 2 mg via INTRAVENOUS
  Filled 2015-06-17 (×3): qty 1

## 2015-06-17 NOTE — Progress Notes (Signed)
4 Days Post-Op  Subjective: Comfortable No nausea but a little bloating Had bm  Objective: Vital signs in last 24 hours: Temp:  [98 F (36.7 C)-98.8 F (37.1 C)] 98.7 F (37.1 C) (06/04 0511) Pulse Rate:  [74-98] 80 (06/04 0511) Resp:  [8-15] 12 (06/04 0800) BP: (122-149)/(58-71) 129/71 mmHg (06/04 0511) SpO2:  [93 %-98 %] 98 % (06/04 0800) Last BM Date: 06/16/15  Intake/Output from previous day: 06/03 0701 - 06/04 0700 In: 2620 [P.O.:820; I.V.:1800] Out: 3750 [Urine:3750] Intake/Output this shift:    Abdomen soft, mildly full, minimally tender  Lab Results:   Recent Labs  06/17/15 0323  WBC 6.9  HGB 10.1*  HCT 31.8*  PLT 248   BMET  Recent Labs  06/17/15 0323  NA 134*  K 4.0  CL 100*  CO2 25  GLUCOSE 84  BUN 9  CREATININE 0.95  CALCIUM 8.4*   PT/INR No results for input(s): LABPROT, INR in the last 72 hours. ABG No results for input(s): PHART, HCO3 in the last 72 hours.  Invalid input(s): PCO2, PO2  Studies/Results: No results found.  Anti-infectives: Anti-infectives    Start     Dose/Rate Route Frequency Ordered Stop   06/13/15 1130  metroNIDAZOLE (FLAGYL) IVPB 500 mg     500 mg 100 mL/hr over 60 Minutes Intravenous To ShortStay Surgical 06/12/15 1426 06/13/15 1155   06/13/15 1100  ceFAZolin (ANCEF) IVPB 2g/100 mL premix     2 g 200 mL/hr over 30 Minutes Intravenous To ShortStay Surgical 06/12/15 1426 06/13/15 1210      Assessment/Plan: s/p Procedure(s): COLOSTOMY TAKEDOWN (N/A) PORTA CATH REMOVAL  D/c foley D/c PCA Keep on fulls  LOS: 4 days    Layal Javid A 06/17/2015

## 2015-06-18 NOTE — Progress Notes (Signed)
5 Days Post-Op  Subjective: Voiding better Tolerating po Still needing some IV pain meds  Objective: Vital signs in last 24 hours: Temp:  [98.1 F (36.7 C)-98.7 F (37.1 C)] 98.4 F (36.9 C) (06/05 0619) Pulse Rate:  [77-94] 80 (06/05 0619) Resp:  [16-18] 18 (06/05 0619) BP: (104-150)/(57-94) 150/84 mmHg (06/05 0619) SpO2:  [95 %-96 %] 95 % (06/05 0619) Last BM Date: 06/16/15  Intake/Output from previous day: 06/04 0701 - 06/05 0700 In: 1957.5 [P.O.:1000; I.V.:957.5] Out: 2451 [Urine:2450; Stool:1] Intake/Output this shift:    Abdomen soft Bandage removed, incisions clean  Lab Results:   Recent Labs  06/17/15 0323  WBC 6.9  HGB 10.1*  HCT 31.8*  PLT 248   BMET  Recent Labs  06/17/15 0323  NA 134*  K 4.0  CL 100*  CO2 25  GLUCOSE 84  BUN 9  CREATININE 0.95  CALCIUM 8.4*   PT/INR No results for input(s): LABPROT, INR in the last 72 hours. ABG No results for input(s): PHART, HCO3 in the last 72 hours.  Invalid input(s): PCO2, PO2  Studies/Results: No results found.  Anti-infectives: Anti-infectives    Start     Dose/Rate Route Frequency Ordered Stop   06/13/15 1130  metroNIDAZOLE (FLAGYL) IVPB 500 mg     500 mg 100 mL/hr over 60 Minutes Intravenous To ShortStay Surgical 06/12/15 1426 06/13/15 1155   06/13/15 1100  ceFAZolin (ANCEF) IVPB 2g/100 mL premix     2 g 200 mL/hr over 30 Minutes Intravenous To ShortStay Surgical 06/12/15 1426 06/13/15 1210      Assessment/Plan: s/p Procedure(s): COLOSTOMY TAKEDOWN (N/A) PORTA CATH REMOVAL  Saline lock IV Pt may shower Plan discharge tomorrow if no longer needing IV pain meds  LOS: 5 days    [name] A 06/18/2015

## 2015-06-19 MED ORDER — HYDROCODONE-ACETAMINOPHEN 7.5-325 MG/15ML PO SOLN: 10.0000 mL | ORAL | Status: DC | PRN

## 2015-06-19 NOTE — Progress Notes (Signed)
Discharge papers gone over with pt. Prescription given to pt. NO questions/complaints. IV was already out. Pt. D/c'd successfully via w/c.

## 2015-06-19 NOTE — Discharge Instructions (Signed)
[facility], [place] [phone]  OPEN ABDOMINAL SURGERY: POST OP INSTRUCTIONS  Always review your discharge instruction sheet given to you by the facility where your surgery was performed.  IF YOU HAVE DISABILITY OR FAMILY LEAVE FORMS, YOU MUST BRING THEM TO THE OFFICE FOR PROCESSING.  PLEASE DO NOT GIVE THEM TO YOUR DOCTOR.  1. A prescription for pain medication may be given to you upon discharge.  Take your pain medication as prescribed, if needed.  If narcotic pain medicine is not needed, then you may take acetaminophen (Tylenol) or ibuprofen (Advil) as needed. 2. Take your usually prescribed medications unless otherwise directed. 3. If you need a refill on your pain medication, please contact your pharmacy. They will contact our office to request authorization.  Prescriptions will not be filled after 5pm or on week-ends. 4. You should follow a light diet the first few days after arrival home, such as soup and crackers, pudding, etc.unless your doctor has advised otherwise. A high-fiber, low fat diet can be resumed as tolerated.   Be sure to include lots of fluids daily. Most patients will experience some swelling and bruising on the chest and neck area.  Ice packs will help.  Swelling and bruising can take several days to resolve 5. Most patients will experience some swelling and bruising in the area of the incision. Ice pack will help. Swelling and bruising can take several days to resolve..  6. It is common to experience some constipation if taking pain medication after surgery.  Increasing fluid intake and taking a stool softener will usually help or prevent this problem from occurring.  A mild laxative (Milk of Magnesia or Miralax) should be taken according to package directions if there are no bowel movements after 48 hours. 7.  You may have steri-strips (small skin tapes) in place directly over the incision.  These strips should be left on the skin for 7-10 days.  If your  surgeon used skin glue on the incision, you may shower in 24 hours.  The glue will flake off over the next 2-3 weeks.  Any sutures or staples will be removed at the office during your follow-up visit. You may find that a light gauze bandage over your incision may keep your staples from being rubbed or pulled. You may shower and replace the bandage daily. 8. ACTIVITIES:  You may resume regular (light) daily activities beginning the next day--such as daily self-care, walking, climbing stairs--gradually increasing activities as tolerated.  You may have sexual intercourse when it is comfortable.  Refrain from any heavy lifting or straining until approved by your doctor. a. You may drive when you no longer are taking prescription pain medication, you can comfortably wear a seatbelt, and you can safely maneuver your car and apply brakes b. Return to Work: ___________________________________ 13. You should see your doctor in the office for a follow-up appointment approximately two weeks after your surgery.  Make sure that you call for this appointment within a day or two after you arrive home to insure a convenient appointment time. OTHER INSTRUCTIONS: NO LIFTING MORE THAN 15 TO 20 POUNDS FOR 5 MORE WEEKS _____________________________________________________________ _____________________________________________________________  WHEN TO CALL YOUR DOCTOR: 1. Fever over 101.0 2. Inability to urinate 3. Nausea and/or vomiting 4. Extreme swelling or bruising 5. Continued bleeding from incision. 6. Increased pain, redness, or drainage from the incision. 7. Difficulty swallowing or breathing 8. Muscle cramping or spasms. 9. Numbness or tingling in hands or  feet or around lips.  The clinic staff is available to answer your questions during regular business hours.  Please dont hesitate to call and ask to speak to one of the nurses if you have concerns.  For further questions, please visit  [email or web address]

## 2015-06-19 NOTE — Discharge Summary (Signed)
Physician Discharge Summary  Patient ID: [name] MRN: [id number] DOB/AGE: 1955/06/03 60 y.o.  Admit date: 06/13/2015 Discharge date: 06/19/2015  Admission Diagnoses:  Discharge Diagnoses:  Active Problems:   S/P colostomy takedown   Discharged Condition: good  Hospital Course: uneventful post op recovery s/p colostomy takedown.  On Entereg.  Quick return of bowel function.  Mild post op urinary retention.  On day on d/c, tolerating diet, having BM's, and voiding well.  Consults: None  Significant Diagnostic Studies:   Treatments: surgery: colostomy takedown  Discharge Exam: Blood pressure 125/78, pulse 72, temperature 98.4 F (36.9 C), temperature source Oral, resp. rate 20, height 5\' 8"  (1.727 m), weight 75.297 kg (166 lb), SpO2 98 %. General appearance: alert, cooperative and no distress Resp: clear to auscultation bilaterally Cardio: regular rate and rhythm, S1, S2 normal, no murmur, click, rub or gallop Incision/Wound:abdomen soft, non-tender, incisions clean  Disposition: 01-Home or Self Care     Medication List    TAKE these medications        ACID REDUCER PO  Take 1 tablet by mouth daily as needed. For acid reflux     ALPRAZolam 0.5 MG tablet  Commonly known as:  XANAX  Place 1 tablet (0.5 mg total) into feeding tube 3 (three) times daily as needed for anxiety.     aspirin 81 MG chewable tablet  Commonly known as:  ASPIRIN CHILDRENS  Place 1 tablet (81 mg total) into feeding tube daily.     buPROPion 100 MG tablet  Commonly known as:  WELLBUTRIN  Place 1.5 tablets (150 mg total) into feeding tube 2 (two) times daily.     carvedilol 6.25 MG tablet  Commonly known as:  COREG  Place 1 tablet (6.25 mg total) into feeding tube 2 (two) times daily.     HYDROcodone-acetaminophen 7.5-325 mg/15 ml solution  Commonly known as:  HYCET  Take 10 mLs by mouth every 4 (four) hours as needed for moderate pain.           Follow-up Information    Follow  up with [facility], MD. Schedule an appointment as soon as possible for a visit in 1 week.   Specialty:  General Surgery   Why:  For suture removal   Contact information:   [address] [place] [address] [phone]       Signed: Harl Bowie 06/19/2015, 7:09 AM

## 2015-06-19 NOTE — Progress Notes (Signed)
Patient ID: [name], male   DOB: 18-Feb-1955, 60 y.o.   MRN: [id number]   Feeling well Pain controlled Abdomen soft  Plan: Discharge home

## 2015-06-21 NOTE — Progress Notes (Addendum)
Mr. Edward Gabrys. 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    Seen by Dr. [name] April 05, 2015 no documentation of a laryngoscopy during the visit.  Colostomy takedown 06-13-15 Encouraged to go back with speech and swallowing pathology but he does not want to do that per Dr. Janeice Robinson note from 06-05-15.  Laryngoscopy done today.   Pain issues, if any:No Using a feeding tube?: No Weight changes, if any:  Wt Readings from Last 3 Encounters:  06/27/15 158 lb 4.8 oz (71.804 kg)  06/14/15 166 lb (75.297 kg)  06/06/15 166 lb 4 oz (75.411 kg)   Swallowing issues, if any: Difficulty swallowing when he eats. Smoking or chewing tobacco?: Quit 06-09-13  40 years cigarette 2 packs/day Using fluoride trays daily? No Last ENT visit was on: 06-05-15 Dr. [name] Other notable issues, if any: None Skin:Normal color Fatigue:Having fatigue most of the day.

## 2015-06-27 ENCOUNTER — Ambulatory Visit
Admission: RE | Admit: 2015-06-27 | Discharge: 2015-06-27 | Disposition: A | Discharge status: Home / Self Care | Payer: Medicaid Other | Source: Ambulatory Visit | Attending: Radiation Oncology | Admitting: Radiation Oncology

## 2015-06-27 ENCOUNTER — Encounter: Payer: Self-pay | Admitting: Radiation Oncology

## 2015-06-27 ENCOUNTER — Ambulatory Visit: Payer: Self-pay | Admitting: Radiation Oncology

## 2015-06-27 VITALS — BP 135/77 | HR 89 | Temp 98.2°F | Resp 18 | Ht 68.0 in | Wt 158.3 lb

## 2015-06-27 DIAGNOSIS — R1310 Dysphagia, unspecified: Secondary | ICD-10-CM | POA: Insufficient documentation

## 2015-06-27 DIAGNOSIS — R601 Generalized edema: Secondary | ICD-10-CM | POA: Diagnosis not present

## 2015-06-27 DIAGNOSIS — R5383 Other fatigue: Secondary | ICD-10-CM | POA: Insufficient documentation

## 2015-06-27 DIAGNOSIS — C329 Malignant neoplasm of larynx, unspecified: Secondary | ICD-10-CM | POA: Diagnosis present

## 2015-06-27 DIAGNOSIS — Z923 Personal history of irradiation: Secondary | ICD-10-CM | POA: Insufficient documentation

## 2015-06-27 DIAGNOSIS — L538 Other specified erythematous conditions: Secondary | ICD-10-CM | POA: Insufficient documentation

## 2015-06-27 LAB — TSH: TSH: 3.772 m(IU)/L (ref 0.320–4.118)

## 2015-06-27 MED ORDER — LARYNGOSCOPY SOLUTION RAD-ONC: 15.0000 mL | Freq: Once | TOPICAL | Status: DC

## 2015-06-27 NOTE — Addendum Note (Signed)
Encounter addended by: Malena Edman, RN on: 06/27/2015  2:18 PM<BR>     Documentation filed: Charges VN, Visit Diagnoses, Dx Association, Orders

## 2015-06-27 NOTE — Addendum Note (Signed)
Encounter addended by: Malena Edman, RN on: 06/27/2015  2:32 PM<BR>     Documentation filed: Dx Association, Charges VN, Orders

## 2015-06-27 NOTE — Progress Notes (Signed)
Department of Radiation Oncology  Phone:  [phone] Fax:        [phone]   Name: [name] MRN: [id number]  DOB: 1955/03/22  Date: 06/27/2015  Follow Up Visit Note  Diagnosis: Squamous cell carcinoma of larynx [facility])   Staging form: Larynx - Supraglottis, AJCC 7th Edition     Clinical stage from 07/20/2014: Stage III (T3, N0, M0) - Signed by [name], MD on 08/01/2014  Summary and Interval since last radiation: 8 months. Completed 09/05/2014 - 10/23/2014 to the larynx with 70 Gy at 2 Gy for 35 fractions with at risk lymph nodes receiving 63 Gy at 1.8 Gy per fraction and 56 Gy at 1.6 Gy per fraction.  Interval History: [name] presents today for routine followup. He denies pain in his throat. He is not using a feeding tube. He has lost about 8 pounds the last two weeks. He mentions he has some difficulty swallowing when he eats if the food is larger than a peanut. He is not using fluoride trays daily. He saw Dr. [name] 06/05/2015. He mentions that he has fatigue most of the day.   Physical Exam:  Filed Vitals:   06/27/15 1254  BP: 135/77  Pulse: 89  Temp: 98.2 F (36.8 C)  TempSrc: Oral  Resp: 18  Height: 5\' 8"  (1.727 m)  Weight: 158 lb 4.8 oz (71.804 kg)  SpO2: 97%   PROCEDURE NOTE: After anesthetizing the nasal cavity with topical lidocaine and phenylephrine, the flexible endoscope was introduced and passed through the nasal cavity.  A lot of erythema in his supraglottic larynx. Chords are mobile with some erythema and edema on the right false vocal cord. Vocal cords approximate in line, no tumor.   IMPRESSION: [name] is a 60 y.o. male with T3 N0 supraglottic larynx cancer. Status post definitive radiation with resolving the acute effects of treatment but residual mass seen on recent CT scan.  PLAN: 1) Head and Neck Cancer Status: No signs of recurrence.  2) Nutritional Status: Good. He is able to eat and swallow. He has lost 8 pounds in the last 2 weeks after his  recent surgery. PEG tube: No  3) Risk Factors: The patient has been educated about risk factors including alcohol and tobacco abuse; they understand that avoidance of alcohol and tobacco is important to prevent recurrences as well as other cancers  4) Swallowing: He still has difficulty swallowing, especially if the food is bigger than a peanut. He has been doing his swallowing exercises and doesn't feel he needs to be referred to speech therapy. We encouraged him to discuss possible esophageal dilatation with Dr. [name].   5) Dental: Encouraged to continue regular followup with dentistry, and dental hygiene including fluoride rinses. I told him if his dentist is planning on pulling any teeth, he needs to speak with me prior.  6) Thyroid function: No results found for: TSH  He is going to lab today to get his TSH checked and will then have it checked annually by his primary care.  7) He will have a CT of the chest for lung cancer screening in February.  8) He will follow up with Dr. [name] in July.  9) Follow-up in 4 months. The patient was encouraged to call with any issues or questions before then.  ------------------------------------------------  [name], MD    This document serves as a record of services personally performed by [name], MD. It was created on her behalf by  Lendon Collar, a  trained medical scribe. The creation of this record is based on the scribe's personal observations and the provider's statements to them. This document has been checked and approved by the attending provider.

## 2015-07-05 ENCOUNTER — Telehealth: Payer: Self-pay | Admitting: *Deleted

## 2015-07-05 NOTE — Progress Notes (Signed)
Quick Note:  Please call patient with normal result.  Thanks. MM ______ 

## 2015-07-05 NOTE — Telephone Encounter (Signed)
Called message left to call back and ask for nurse Tonie at [phone] to give results from [facility] lab results per Dr. Tammi Klippel.

## 2015-07-06 ENCOUNTER — Telehealth: Payer: Self-pay | Admitting: *Deleted

## 2015-07-06 NOTE — Telephone Encounter (Signed)
Second message left for [name] to call back and ask for [facility] nurse for Dr. Pablo Ledger in reference to his TSH lab results which were reviewed by Dr. Tammi Klippel.

## 2015-07-08 ENCOUNTER — Other Ambulatory Visit: Payer: Self-pay | Admitting: Cardiovascular Disease

## 2015-07-09 ENCOUNTER — Other Ambulatory Visit: Payer: Self-pay | Admitting: Cardiovascular Disease

## 2015-07-09 NOTE — Telephone Encounter (Signed)
Rx(s) sent to pharmacy electronically.  

## 2015-10-09 ENCOUNTER — Other Ambulatory Visit (HOSPITAL_COMMUNITY): Payer: Self-pay | Admitting: Otolaryngology

## 2015-10-09 DIAGNOSIS — R1310 Dysphagia, unspecified: Secondary | ICD-10-CM

## 2015-10-09 DIAGNOSIS — C329 Malignant neoplasm of larynx, unspecified: Secondary | ICD-10-CM

## 2015-10-12 ENCOUNTER — Ambulatory Visit (HOSPITAL_COMMUNITY)
Admission: RE | Admit: 2015-10-12 | Discharge: 2015-10-12 | Disposition: A | Discharge status: Home / Self Care | Payer: Medicaid Other | Source: Ambulatory Visit | Attending: Otolaryngology | Admitting: Otolaryngology

## 2015-10-12 DIAGNOSIS — C329 Malignant neoplasm of larynx, unspecified: Secondary | ICD-10-CM | POA: Insufficient documentation

## 2015-10-12 DIAGNOSIS — R1310 Dysphagia, unspecified: Secondary | ICD-10-CM

## 2015-10-12 DIAGNOSIS — R1314 Dysphagia, pharyngoesophageal phase: Secondary | ICD-10-CM | POA: Insufficient documentation

## 2015-10-15 ENCOUNTER — Encounter: Payer: Self-pay | Admitting: Cardiovascular Disease

## 2015-10-15 ENCOUNTER — Telehealth: Payer: Self-pay | Admitting: Cardiovascular Disease

## 2015-10-15 NOTE — Telephone Encounter (Signed)
No longer on Brilinta per my records. Letter sent via Epic

## 2015-10-15 NOTE — Telephone Encounter (Signed)
New message       Request for surgical clearance:  What type of surgery is being performed?   esophagoscopy 1. When is this surgery scheduled? Pending clearance  Are there any medications that need to be held prior to surgery and how long? Hold aspirin and brilinta?  If yes, when to stop prior to procedure and when to restart medication? Also, need cardiac clearance Name of physician performing surgery?  Dr [name] 2. What is your office phone and fax number? Fax [phone]

## 2015-10-15 NOTE — Telephone Encounter (Signed)
Will forward to Dr Croitoru for review  

## 2015-10-16 NOTE — Telephone Encounter (Signed)
Called message on secure voicemail- letter sent for clearance. Will re-route letter. May call call back if any question are needed to be asked

## 2015-10-17 ENCOUNTER — Encounter: Payer: Self-pay | Admitting: Oncology

## 2015-10-18 ENCOUNTER — Encounter: Payer: Self-pay | Admitting: Radiation Oncology

## 2015-10-18 ENCOUNTER — Ambulatory Visit
Admission: RE | Admit: 2015-10-18 | Discharge: 2015-10-18 | Disposition: A | Discharge status: Home / Self Care | Payer: Medicaid Other | Source: Ambulatory Visit | Attending: Radiation Oncology | Admitting: Radiation Oncology

## 2015-10-18 VITALS — BP 148/90 | HR 68 | Temp 98.1°F | Ht 68.0 in | Wt 158.0 lb

## 2015-10-18 DIAGNOSIS — Z888 Allergy status to other drugs, medicaments and biological substances status: Secondary | ICD-10-CM | POA: Insufficient documentation

## 2015-10-18 DIAGNOSIS — Z923 Personal history of irradiation: Secondary | ICD-10-CM | POA: Insufficient documentation

## 2015-10-18 DIAGNOSIS — Z7982 Long term (current) use of aspirin: Secondary | ICD-10-CM | POA: Insufficient documentation

## 2015-10-18 DIAGNOSIS — R682 Dry mouth, unspecified: Secondary | ICD-10-CM | POA: Insufficient documentation

## 2015-10-18 DIAGNOSIS — C329 Malignant neoplasm of larynx, unspecified: Secondary | ICD-10-CM | POA: Insufficient documentation

## 2015-10-18 NOTE — Progress Notes (Signed)
Radiation Oncology         [phone] ________________________________  Name: [name] MRN: [id number]  Date: 10/18/2015  DOB: May 08, 1955  Follow-Up Visit Note  CC: Imelda Pillow, NP  [name], MD    ICD-9-CM ICD-10-CM   1. Squamous cell carcinoma of larynx (HCC) 161.9 C32.9     Diagnosis:   Squamous cell carcinoma of larynx (HCC)   Staging form: Larynx - Supraglottis, AJCC 7th Edition     Clinical stage from 07/20/2014: Stage III (T3, N0, M0) -   Interval Since Last Radiation: 1 year. Completed 09/05/2014 - 10/23/2014 to the larynx with 70 Gy at 2 Gy for 35 fractions with at risk lymph nodes receiving 63 Gy at 1.8 Gy per fraction and 56 Gy at 1.6 Gy per fraction. The patient was treated by Dr. Pablo Ledger.  Narrative:  The patient returns today for routine follow-up after treatment to his larynx. He reports having pain in his throat, and is rating it at a 6/10 today. He is eating softer foods and is not taking pain medication. He reports he can not take pill medications. So, he disolves them in water and he drinks them. He reports his esophagus will be stretched by Dr. [name] around 10/31/15. He reports a dry mouth. He also reports having problems with his teeth in his left upper jaw and would like to see Dr. Enrique Sack.                             ALLERGIES:  is allergic to cetuximab.  Meds: Current Outpatient Prescriptions  Medication Sig Dispense Refill  . ALPRAZolam (XANAX) 0.5 MG tablet Place 1 tablet (0.5 mg total) into feeding tube 3 (three) times daily as needed for anxiety. (Patient taking differently: 0.5 mg by Other route 3 (three) times daily as needed for anxiety. Dissolve in water)  0  . aspirin (ASPIRIN CHILDRENS) 81 MG chewable tablet Place 1 tablet (81 mg total) into feeding tube daily. (Patient taking differently: 81 mg by Other route daily. Dissolve in water) 30 tablet 0  . buPROPion (WELLBUTRIN) 100 MG tablet Place 1.5 tablets (150 mg total) into  feeding tube 2 (two) times daily. (Patient taking differently: 150 mg by Other route 2 (two) times daily. Dissolve in water) 90 tablet 0  . carvedilol (COREG) 6.25 MG tablet PLACE ONE TABLET INTO THE FEEDING TUBE TWICE DAILY 60 tablet 6  . RaNITidine HCl (ACID REDUCER PO) Take 1 tablet by mouth daily as needed. For acid reflux    . HYDROcodone-acetaminophen (HYCET) 7.5-325 mg/15 ml solution Take 10 mLs by mouth every 4 (four) hours as needed for moderate pain. (Patient not taking: Reported on 10/18/2015) 120 mL 0   No current facility-administered medications for this encounter.     Physical Findings: The patient is in no acute distress. Patient is alert and oriented.  height is 5\' 8"  (1.727 m) and weight is 158 lb (71.7 kg). His oral temperature is 98.1 F (36.7 C). His blood pressure is 148/90 (abnormal) and his pulse is 68. His oxygen saturation is 100%. .  No significant changes.  Lungs are clear to auscultation bilaterally. Heart has regular rate and rhythm. No palpable cervical, supraclavicular, or axillary adenopathy. Abdomen soft, non-tender, normal bowel sounds.  No palpable adenopathy in the neck or axillary region. Patient has some radiation changes in the neck and some edema in the interior neck region. Oral cavity  reveals mucosal  to be dry no mucosal lesions. Patient has poor dentitian with several teeth missing along the maxilla. Patient is noted to be hoarse, according to patient and family, this has been stable.   Indirect Laryngoscopy: Patient to proceeded to undergo indirect mirror examination through oral cavity. He continues to have edema in the supraglottic region with no obvious tumor. Vocal cords moved well. I was unable to see the entire extent on vocal cords on exam. Palpation on base of the  tongue regions reveals no palpable signs of recurrence.   Lab Findings: Lab Results  Component Value Date   WBC 6.9 06/17/2015   HGB 10.1 (L) 06/17/2015   HCT 31.8 (L)  06/17/2015   MCV 94.9 06/17/2015   PLT 248 06/17/2015    Radiographic Findings: Dg Esophagus  Result Date: 10/12/2015 CLINICAL DATA:  History of laryngeal carcinoma status post radiation therapy. Can not swallow food larger than a peanut EXAM: ESOPHOGRAM/BARIUM SWALLOW TECHNIQUE: Single contrast examination was performed using  thin barium. FLUOROSCOPY TIME:  Fluoroscopy Time:  0.6 minutes Radiation Exposure Index (if provided by the fluoroscopic device): 1.8 mGyair kerma Number of Acquired Spot Images: 0 COMPARISON:  PET CT 02/14/2015 FINDINGS: Lateral pharyngeal imaging shows deep and persistent laryngeal penetration in the setting of postoperative epiglottic thickening and poor turnover. There was supraglottic larynx stasis and eventually aspiration - that was silent. 3 mm minimum channel like esophageal narrowing at the level of C5-6 that has smooth margins. Poor distension of the thoracic esophagus due to limited flow at the stricture. No mucosal lesion or stricture seen in the chest. No hiatal hernia. These results will be called to the ordering clinician or representative by the Radiologist Assistant, and communication documented in the PACS or zVision Dashboard. IMPRESSION: 1. 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 Margins are smooth and this is likely radiation related. 2. Postoperative thickening of the epiglottis with poor turnover that leads to supraglottic laryngeal stasis and silent aspiration. Electronically Signed   By: Monte Fantasia M.D.   On: 10/12/2015 09:10    Impression:  The patient is recovering from the effects of radiation. No evidence of recurrence on clinical exam today.  TSH was normal in June of this year  Plan:  Patient will proceed with dilation of esophagus in the near future by Dr. [name]. Patient will be referred to Dr. Enrique Sack in regards to two cracked teeth in maxillary region and pain in this region.Patient will follow up with myself in 4  months.  ____________________________________  This document serves as a record of services personally performed by Gery Pray, MD. It was created on his behalf by Bethann Humble, a trained medical scribe. The creation of this record is based on the scribe's personal observations and the provider's statements to them. This document has been checked and approved by the attending provider.

## 2015-10-18 NOTE — Progress Notes (Signed)
[name] is here for follow up after treatment to his larynx.  He reports having pain in his throat and is rating it at a 6/10 today.  He is eating softer foods and is not taking pain medication.  He reports his esophagus will be stretched by Dr. [name] around 10/18.  He reports his mouth is dry.  He also reports having problems with his teeth in his left upper jaw and would like to see Dr. Enrique Sack.  BP (!) 148/90 (BP Location: Right Arm, Patient Position: Sitting)   Pulse 68   Temp 98.1 F (36.7 C) (Oral)   Ht 5\' 8"  (1.727 m)   Wt 158 lb (71.7 kg)   SpO2 100%   BMI 24.02 kg/m    Wt Readings from Last 3 Encounters:  10/18/15 158 lb (71.7 kg)  06/27/15 158 lb 4.8 oz (71.8 kg)  06/14/15 166 lb (75.3 kg)

## 2015-10-28 ENCOUNTER — Ambulatory Visit: Payer: Self-pay | Admitting: Otolaryngology

## 2015-10-28 NOTE — H&P (Signed)
  History:  1 year posttreatment, still having trouble swallowing. He has to chew his food aggressively in order to swallow. He is not smoking. He has horrible dentition and has had dental infections. He does not have a dentist.  Physical exam:  There are no palpable masses in the neck.  There is diffuse fibrotic edema.  Oral cavity and pharynx are clear with healthy mucosa, but very dry, and upper dentition in serious disrepair. Indirect laryngoscopy reveals glottic edema without any obvious mucosal lesions identified.  Cords appear to move well.  Assessment/Pln:  One-year posttreatment, no evidence of recurrent disease. Continues to have trouble swallowing. Recommend a barium swallow. He may need dilatation of the esophagus. Also recommend we get him in to see Dr. Enrique Sack for his teeth.

## 2015-10-29 ENCOUNTER — Encounter (HOSPITAL_COMMUNITY): Payer: Self-pay

## 2015-10-29 ENCOUNTER — Encounter (HOSPITAL_COMMUNITY)
Admission: RE | Admit: 2015-10-29 | Discharge: 2015-10-29 | Disposition: A | Discharge status: Home / Self Care | Payer: Medicaid Other | Source: Ambulatory Visit | Attending: Otolaryngology | Admitting: Otolaryngology

## 2015-10-29 DIAGNOSIS — Z01818 Encounter for other preprocedural examination: Secondary | ICD-10-CM | POA: Insufficient documentation

## 2015-10-29 HISTORY — DX: Esophageal obstruction: K22.2

## 2015-10-29 HISTORY — DX: Cardiac murmur, unspecified: R01.1

## 2015-10-29 LAB — COMPREHENSIVE METABOLIC PANEL
ALBUMIN: 3.8 g/dL (ref 3.5–5.0)
ALK PHOS: 100 U/L (ref 38–126)
ALT: 20 U/L (ref 17–63)
ANION GAP: 6 (ref 5–15)
AST: 26 U/L (ref 15–41)
BILIRUBIN TOTAL: 0.6 mg/dL (ref 0.3–1.2)
BUN: 9 mg/dL (ref 6–20)
CALCIUM: 9.8 mg/dL (ref 8.9–10.3)
CO2: 27 mmol/L (ref 22–32)
CREATININE: 1.18 mg/dL (ref 0.61–1.24)
Chloride: 105 mmol/L (ref 101–111)
GFR calc Af Amer: >60 mL/min (ref >60)
GFR calc non Af Amer: >60 mL/min (ref >60)
GLUCOSE: 121 mg/dL — AB (ref 65–99)
Potassium: 4 mmol/L (ref 3.5–5.1)
Sodium: 138 mmol/L (ref 135–145)
TOTAL PROTEIN: 7.1 g/dL (ref 6.5–8.1)

## 2015-10-29 LAB — CBC
HEMATOCRIT: 38.2 % — AB (ref 39.0–52.0)
HEMOGLOBIN: 12.6 g/dL — AB (ref 13.0–17.0)
MCH: 30.1 pg (ref 26.0–34.0)
MCHC: 33 g/dL (ref 30.0–36.0)
MCV: 91.4 fL (ref 78.0–100.0)
Platelets: 292 10*3/uL (ref 150–400)
RBC: 4.18 MIL/uL — AB (ref 4.22–5.81)
RDW: 15 % (ref 11.5–15.5)
WBC: 5.4 10*3/uL (ref 4.0–10.5)

## 2015-10-29 NOTE — Progress Notes (Signed)
Anesthesia Chart Review: Patient is a 60 year old male scheduled for esophagoscopy with dilation on 10/31/15 by Dr. [name].  History includes CAD s/p CABG (SVG-LAD) 09/2004, NSTEMI 01/19/13 s/p DES CX [facility]), NSTEMI 06/01/13 (severe mid SVG-LAD stenosis) s/p DES to LAD [id number] (complicated by thrombus embolization to mid SVG s/p retrieval) [facility]), VF cardiac arrest 06/09/13 (unwitnessed; received at least 15 min CPR and Epi X 3, hypothermia protocol, hospital course complicated by cardiogenic shock, liver shock, respiratory failure, and ARI; p2y12 was elevated and Plavix changed to Brilinta; hospitalized thru 06/20/13), ischemic cardiomyopathy (required life vest 06/2013- 09/2013), chronic combined systolic and diastolic CHF, HTN, HLD, PAD with left SCA stent '06 and right iliac artery stent '10, moderate to severe renal artery stenosis, carotid artery stenosis (occluded LCCA, moderate 70% RICA stenosis '17), perforated sigmoid diverticulitis s/p exploratory laparotomy with sigmoid colectomy and colostomy 08/05/14 s/p colostomy takedown 06/13/15, 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 OSA screening score was 5 in April.   - PCP is [name], NP.  - RAD-ONC is Dr. [name], last visit on 10/18/15 with Dr. Sondra Come. Dental referral to Dr. Enrique Sack recommended. - HEM-ONC is Dr. [name]. - Vascular surgeon is Dr. Trula Slade. Next visit 11/12/15. - Cardiologist is Dr. Sanda Klein, last visit 04/05/15. He sent a letter for cardiac clearance, low risk. He gave permission to hold ASA 5 days if needed.   Meds include Xanax, ASA 81 mg, Wellbutrin, Coreg, ranitidine.  BP 130/63   Pulse 73   Temp 36.7 C   Resp 20   Ht 5\' 7"  (1.702 m)   Wt 156 lb 11.2 oz (71.1 kg)   SpO2 98%    BMI 24.54 kg/m   01/03/15 EKG: NSR, septal infarct (age undetermined), T wave abnormality, consider anterior ischemia.  08/07/14 Echo: Study Conclusions - Left ventricle: The cavity size was normal. Wall thickness was normal. Systolic function was normal. The estimated ejection fraction was in the range of 50% to 55%. There is hypokinesis of the mid-apicalanteroseptal myocardium. Left ventricular diastolic function parameters were normal. - Mild tricuspid regurgitation. (Comparison EF: 45-50% 09/27/13, 10-15% 06/10/13.)  07/04/14 Nuclear stress test: There was no ST segment deviation noted during stress. Normal myocardial perfusion (2 day protocol). Gating not accurate secondary to technical difficulties.  06/15/13 Cardiac Cath (Dr. Daneen Schick): IMPRESSIONS:  1. Widely patent saphenous vein graft to the mid LAD. This vessel is stented and there is no evidence of ISR. 2. Widely patent proximal to mid circumflex stent. There is haziness in the circumflex proximal to the stent but no significant obstruction. 3. Mild to moderate distal left main stenosis in the 30-40% range. 4. 80-90% ostial LAD threatening a small to moderate diagonal territory in the distribution of the wall motion abnormality. The mid LAD off the retrograde segment from the graft insertion site is also occluded. 5. Widely patent RCA. 6. LVEF 40%. Basal to mid anterior wall moderate hypokinesis. RECOMMENDATION: The patient is a fixed basal to mid anterior wall motion abnormality. This is in the distribution of proximal LAD and diagonal. This raises the question of whether or not the ostial LAD/diagonal could be a source of ischemia that cause arrhythmia. Intervention in this area would be difficult due to heavy calcification and will involve the left main which was then  threatened the circumflex. If recurrent angina, the only area that would explain this symptom at this time would be the ostial LAD diagonal territory. At some  point consideration of a myocardial perfusion study to look for ischemia/problem myocardium in that region would be reasonable. Consideration of AICD implantation.  04/20/15 Carotid U/S: Impressions: 1. Significantly elevated RICA velocities (314/105), top end of the 60-79% range, with post-stenotic turbulence and dilatation. 2. Chronic left carotid occlusion (CCA, ICA and ECA). 3. Patent vertebral arteries with antegrade flow. 4. Normal subclavian arteries, bilaterally.  05/08/15 Aortic arch and right carotid angiogram: Impression: 1. Approximate 70% stenosis within the right internal carotid artery 2. Occluded left common carotid artery at its origin 3. Patent left subclavian artery stent (Dr. Trula Slade will consider carotid stenting once RICA stenosis is > 80%.)  02/14/15 Post-radiation PET scan: IMPRESSION: Mild irregularity of the left supraglottic larynx, likely reflecting radiation changes. No findings specific for residual tumor or metastatic disease.  01/29/15 CT soft tissue neck: IMPRESSION: 1. Pharynx and larynx: There is new, moderate diffuse edema involving the larynx related to interval radiation therapy. This is greatest in the supraglottic region although slightly extends into the subglottic larynx. The epiglottis is edematous. Changes of interval radiation therapy. Residual, asymmetric left supraglottic enhancement measures 1 cm in thickness, greatly improved from the mass present on the pretreatment study. 2. No enlarged cervical lymph nodes. 3. Vascular: Prominent vascular atherosclerosis the again noted with incompletely evaluated proximal right ICA stenosis. Chronically occluded left common and left cervical internal carotid arteries with partially visualized reconstitution of the ICA intracranially. Left subclavian artery stent.  Preoperative labs noted. Cr 1.18, glucose 121. H/H 12.6/38.2. TSH 3.772 on 06/27/15.    If no acute changes then I would anticipate that he can  proceed as planned. With his 06/13/15 surgery, he had IV induction, easy mask ventilation, glidescope and 3 used to place a 7.5 mm ETT on 1 attempt.   [facility] Short Stay Center/Anesthesiology Phone [phone] 10/29/2015 4:44 PM

## 2015-10-29 NOTE — Pre-Procedure Instructions (Addendum)
[name]  10/29/2015      [id number] - [name], [name] [address] Phone: [phone] Fax: [phone]  [facility] [place], [place] - [place] [address] [place] [address] Phone: [phone] Fax: [phone]  [id number] - [place], [place] [id number] [place] [id number] Phone: [phone] Fax: [phone]  [place], [place] - [address] [place] [address] Phone: [phone] Fax: [phone]    Your procedure is scheduled on 10/31/15  Report to [name] at 1230 A.M.  Call this number if you have problems the morning of surgery:  [phone]   Remember:  Do not eat food or drink liquids after midnight.  Take these medicines the morning of surgery with A SIP OF WATER xanax if needed,buproprion(wellbutrin),carvedilol(coreg)  hydocodone if needed,ranitidine  STOP all herbel meds, nsaids (aleve,naproxen,advil,ibuprofen) Starting TODAY including aspirin, any vitamins   Do not wear jewelry, make-up or nail polish.  Do not wear lotions, powders, or perfumes, or deoderant.  Do not shave 48 hours prior to surgery.  Men may shave face and neck.  Do not bring valuables to the hospital.  [facility] is not responsible for any belongings or valuables.  Contacts, dentures or bridgework may not be worn into surgery.  Leave your suitcase in the car.  After surgery it may be brought to your room.  For patients admitted to the hospital, discharge time will be determined by your treatment team.  Patients discharged the day of surgery will not be allowed to drive home.   Name and phone number of your driver:   Special instructions:   Special Instructions: [place] - Preparing for Surgery  Before surgery, you can play an important role.  Because skin is not sterile, your skin needs to be as free of germs as possible.  You can reduce the number of germs on you skin by washing with CHG (chlorahexidine gluconate) soap before surgery.  CHG is an antiseptic cleaner which kills germs and bonds with the skin to continue killing germs even after washing.  Please DO NOT use if you have an allergy to CHG or antibacterial soaps.  If your skin becomes reddened/irritated stop using the CHG and inform your nurse when you arrive at Short Stay.  Do not shave (including legs and underarms) for at least 48 hours prior to the first CHG shower.  You may shave your face.  Please follow these instructions carefully:   1.  Shower with CHG Soap the night before surgery and the morning of Surgery.  2.  If you choose to wash your hair, wash your hair first as usual with your normal shampoo.  3.  After you shampoo, rinse your hair and body thoroughly to remove the Shampoo.  4.  Use CHG as you would any other liquid soap.  You can apply chg directly  to the skin and wash gently with scrungie or a clean washcloth.  5.  Apply the CHG Soap to your body ONLY FROM THE NECK DOWN.  Do not use on open wounds or open sores.  Avoid contact with your eyes ears, mouth and genitals (private parts).  Wash genitals (  private parts)       with your normal soap.  6.  Wash thoroughly, paying special attention to the area where your surgery will be performed.  7.  Thoroughly rinse your body with warm water from the neck down.  8.  DO NOT shower/wash with your normal soap after using and rinsing off the CHG Soap.  9.  Pat yourself dry with a clean towel.            10.  Wear clean pajamas.            11.  Place clean sheets on your bed the night of your first shower and do not sleep with pets.  Day of Surgery  Do not apply any lotions/deodorants the morning of  surgery.  Please wear clean clothes to the hospital/surgery center.  Please read over the  fact sheets that you were given.

## 2015-10-31 ENCOUNTER — Encounter (HOSPITAL_COMMUNITY)
Admission: RE | Disposition: A | Discharge status: Home / Self Care | Payer: Self-pay | Source: Ambulatory Visit | Attending: Otolaryngology

## 2015-10-31 ENCOUNTER — Ambulatory Visit (HOSPITAL_COMMUNITY): Payer: Self-pay | Admitting: Emergency Medicine

## 2015-10-31 ENCOUNTER — Ambulatory Visit (HOSPITAL_COMMUNITY)
Admission: RE | Admit: 2015-10-31 | Discharge: 2015-10-31 | Disposition: A | Discharge status: Home / Self Care | Payer: Self-pay | Source: Ambulatory Visit | Attending: Otolaryngology | Admitting: Otolaryngology

## 2015-10-31 ENCOUNTER — Ambulatory Visit (HOSPITAL_COMMUNITY): Payer: Self-pay | Admitting: Anesthesiology

## 2015-10-31 DIAGNOSIS — I252 Old myocardial infarction: Secondary | ICD-10-CM | POA: Insufficient documentation

## 2015-10-31 DIAGNOSIS — I10 Essential (primary) hypertension: Secondary | ICD-10-CM | POA: Insufficient documentation

## 2015-10-31 DIAGNOSIS — I739 Peripheral vascular disease, unspecified: Secondary | ICD-10-CM | POA: Insufficient documentation

## 2015-10-31 DIAGNOSIS — I2510 Atherosclerotic heart disease of native coronary artery without angina pectoris: Secondary | ICD-10-CM | POA: Insufficient documentation

## 2015-10-31 DIAGNOSIS — Z87891 Personal history of nicotine dependence: Secondary | ICD-10-CM | POA: Insufficient documentation

## 2015-10-31 DIAGNOSIS — J449 Chronic obstructive pulmonary disease, unspecified: Secondary | ICD-10-CM | POA: Insufficient documentation

## 2015-10-31 DIAGNOSIS — K222 Esophageal obstruction: Secondary | ICD-10-CM | POA: Insufficient documentation

## 2015-10-31 DIAGNOSIS — Z951 Presence of aortocoronary bypass graft: Secondary | ICD-10-CM | POA: Insufficient documentation

## 2015-10-31 HISTORY — PX: ESOPHAGOSCOPY WITH DILITATION: SHX5618

## 2015-10-31 SURGERY — ESOPHAGOSCOPY, WITH DILATION
Anesthesia: General | Site: Mouth

## 2015-10-31 MED ORDER — DEXAMETHASONE SODIUM PHOSPHATE 10 MG/ML IJ SOLN
INTRAMUSCULAR | Status: AC
  Filled 2015-10-31: qty 2

## 2015-10-31 MED ORDER — EPINEPHRINE HCL (NASAL) 0.1 % NA SOLN
NASAL | Status: AC
  Filled 2015-10-31: qty 30

## 2015-10-31 MED ORDER — SUCCINYLCHOLINE CHLORIDE 20 MG/ML IJ SOLN
INTRAMUSCULAR | Status: DC | PRN
  Administered 2015-10-31: 100 mg via INTRAVENOUS

## 2015-10-31 MED ORDER — FENTANYL CITRATE (PF) 100 MCG/2ML IJ SOLN
INTRAMUSCULAR | Status: DC | PRN
  Administered 2015-10-31: 50 ug via INTRAVENOUS
  Administered 2015-10-31: 100 ug via INTRAVENOUS

## 2015-10-31 MED ORDER — ROCURONIUM BROMIDE 10 MG/ML (PF) SYRINGE
PREFILLED_SYRINGE | INTRAVENOUS | Status: AC
  Filled 2015-10-31: qty 20

## 2015-10-31 MED ORDER — 0.9 % SODIUM CHLORIDE (POUR BTL) OPTIME
TOPICAL | Status: DC | PRN
  Administered 2015-10-31: 1000 mL

## 2015-10-31 MED ORDER — PROPOFOL 10 MG/ML IV BOLUS
INTRAVENOUS | Status: AC
  Filled 2015-10-31: qty 20

## 2015-10-31 MED ORDER — PROPOFOL 10 MG/ML IV BOLUS
INTRAVENOUS | Status: DC | PRN
  Administered 2015-10-31: 40 mg via INTRAVENOUS
  Administered 2015-10-31: 160 mg via INTRAVENOUS

## 2015-10-31 MED ORDER — MIDAZOLAM HCL 2 MG/2ML IJ SOLN
INTRAMUSCULAR | Status: AC
  Filled 2015-10-31: qty 2

## 2015-10-31 MED ORDER — MIDAZOLAM HCL 5 MG/5ML IJ SOLN
INTRAMUSCULAR | Status: DC | PRN
  Administered 2015-10-31: 2 mg via INTRAVENOUS

## 2015-10-31 MED ORDER — FENTANYL CITRATE (PF) 100 MCG/2ML IJ SOLN
INTRAMUSCULAR | Status: AC
  Filled 2015-10-31: qty 2

## 2015-10-31 MED ORDER — GLYCOPYRROLATE 0.2 MG/ML IJ SOLN
INTRAMUSCULAR | Status: DC | PRN
  Administered 2015-10-31: 0.2 mg via INTRAVENOUS

## 2015-10-31 MED ORDER — LACTATED RINGERS IV SOLN
INTRAVENOUS | Status: DC
  Administered 2015-10-31 (×2): via INTRAVENOUS

## 2015-10-31 MED ORDER — ONDANSETRON HCL 4 MG/2ML IJ SOLN
INTRAMUSCULAR | Status: DC | PRN
  Administered 2015-10-31: 4 mg via INTRAVENOUS

## 2015-10-31 MED ORDER — LIDOCAINE 2% (20 MG/ML) 5 ML SYRINGE
INTRAMUSCULAR | Status: AC
  Filled 2015-10-31: qty 5

## 2015-10-31 MED ORDER — PHENYLEPHRINE HCL 10 MG/ML IJ SOLN
INTRAMUSCULAR | Status: DC | PRN
  Administered 2015-10-31: 120 ug via INTRAVENOUS
  Administered 2015-10-31: 80 ug via INTRAVENOUS

## 2015-10-31 MED ORDER — ONDANSETRON HCL 4 MG/2ML IJ SOLN
INTRAMUSCULAR | Status: AC
  Filled 2015-10-31: qty 2

## 2015-10-31 MED ORDER — EPHEDRINE SULFATE 50 MG/ML IJ SOLN
INTRAMUSCULAR | Status: DC | PRN
  Administered 2015-10-31 (×2): 10 mg via INTRAVENOUS

## 2015-10-31 MED ORDER — FENTANYL CITRATE (PF) 100 MCG/2ML IJ SOLN
INTRAMUSCULAR | Status: AC
  Filled 2015-10-31: qty 4

## 2015-10-31 MED ORDER — LIDOCAINE HCL (CARDIAC) 20 MG/ML IV SOLN
INTRAVENOUS | Status: DC | PRN
  Administered 2015-10-31: 100 mg via INTRAVENOUS

## 2015-10-31 MED ORDER — SUCCINYLCHOLINE CHLORIDE 200 MG/10ML IV SOSY
PREFILLED_SYRINGE | INTRAVENOUS | Status: AC
  Filled 2015-10-31: qty 30

## 2015-10-31 SURGICAL SUPPLY — 34 items
BALLN PULM 15 16.5 18 X 75CM (BALLOONS) ×1
BALLN PULM 15 16.5 18X75 (BALLOONS) ×2
BALLOON PULM 15 16.5 18X75 (BALLOONS) ×1 IMPLANT
BLADE SURG 15 STRL LF DISP TIS (BLADE) IMPLANT
BLADE SURG 15 STRL SS (BLADE)
CANISTER SUCTION 2500CC (MISCELLANEOUS) ×3 IMPLANT
CONT SPEC 4OZ CLIKSEAL STRL BL (MISCELLANEOUS) IMPLANT
COVER TABLE BACK 60X90 (DRAPES) ×3 IMPLANT
CRADLE DONUT ADULT HEAD (MISCELLANEOUS) IMPLANT
DRAPE PROXIMA HALF (DRAPES) ×3 IMPLANT
GAUZE SPONGE 4X4 12PLY STRL (GAUZE/BANDAGES/DRESSINGS) ×3 IMPLANT
GAUZE SPONGE 4X4 16PLY XRAY LF (GAUZE/BANDAGES/DRESSINGS) ×3 IMPLANT
GLOVE BIO SURGEON STRL SZ7.5 (GLOVE) ×3 IMPLANT
GLOVE BIO SURGEON STRL SZ8 (GLOVE) ×3 IMPLANT
GLOVE SURG SS PI 8.0 STRL IVOR (GLOVE) ×3 IMPLANT
GOWN STRL REUS W/ TWL LRG LVL3 (GOWN DISPOSABLE) IMPLANT
GOWN STRL REUS W/ TWL XL LVL3 (GOWN DISPOSABLE) ×1 IMPLANT
GOWN STRL REUS W/TWL LRG LVL3 (GOWN DISPOSABLE)
GOWN STRL REUS W/TWL XL LVL3 (GOWN DISPOSABLE) ×2
GUARD TEETH (MISCELLANEOUS) ×3 IMPLANT
KIT BASIN OR (CUSTOM PROCEDURE TRAY) ×3 IMPLANT
KIT ROOM TURNOVER OR (KITS) ×3 IMPLANT
MARKER SKIN DUAL TIP RULER LAB (MISCELLANEOUS) IMPLANT
NEEDLE 27GAX1X1/2 (NEEDLE) IMPLANT
NS IRRIG 1000ML POUR BTL (IV SOLUTION) ×3 IMPLANT
PAD ARMBOARD 7.5X6 YLW CONV (MISCELLANEOUS) ×6 IMPLANT
PATTIES SURGICAL .5 X3 (DISPOSABLE) IMPLANT
SOLUTION ANTI FOG 6CC (MISCELLANEOUS) ×3 IMPLANT
SURGILUBE 2OZ TUBE FLIPTOP (MISCELLANEOUS) ×3 IMPLANT
SYR INFLATE BILIARY GAUGE (MISCELLANEOUS) ×3 IMPLANT
TOWEL OR 17X24 6PK STRL BLUE (TOWEL DISPOSABLE) ×3 IMPLANT
TUBE CONNECTING 12'X1/4 (SUCTIONS) ×1
TUBE CONNECTING 12X1/4 (SUCTIONS) ×2 IMPLANT
WATER STERILE IRR 1000ML POUR (IV SOLUTION) IMPLANT

## 2015-10-31 NOTE — Anesthesia Postprocedure Evaluation (Signed)
Anesthesia Post Note  Patient: [name]  Procedure(s) Performed: Procedure(s) (LRB): ESOPHAGOSCOPY WITH DILITATION (N/A)  Patient location during evaluation: PACU Anesthesia Type: General Level of consciousness: awake and alert Pain management: pain level controlled Vital Signs Assessment: post-procedure vital signs reviewed and stable Respiratory status: spontaneous breathing, nonlabored ventilation, respiratory function stable and patient connected to nasal cannula oxygen Cardiovascular status: blood pressure returned to baseline and stable Postop Assessment: no signs of nausea or vomiting Anesthetic complications: no    Last Vitals:  Vitals:   10/31/15 1600 10/31/15 1607  BP:  131/75  Pulse:  84  Resp: 19 12  Temp:  36.4 C    Last Pain: There were no vitals filed for this visit.               Kiosha Buchan Minda Meo

## 2015-10-31 NOTE — Op Note (Addendum)
OPERATIVE REPORT  DATE OF SURGERY: 10/31/2015  PATIENT:  [name],  60 y.o. male  PRE-OPERATIVE DIAGNOSIS:  DYSPHAGIA  POST-OPERATIVE DIAGNOSIS:  Esophageal stricture  PROCEDURE:  Procedure(s): ESOPHAGOSCOPY WITH DILITATION  SURGEON:  Beckie Salts, MD  ASSISTANTS: none  ANESTHESIA:   General   EBL:  0 ml  DRAINS: none  LOCAL MEDICATIONS USED:  None  SPECIMEN:  None  COUNTS:  Correct  PROCEDURE DETAILS: The patient was taken to the operating room and placed on the operating table in the supine position. Following induction of general endotracheal anesthesia, the table was turned 90 and the patient was draped in a standard fashion.  A maxillary tooth protector was used throughout the case. A Jako laryngoscope was used to examine the larynx and the postcricoid area. With this in place, [facility] dilators were placed starting at 23 [place] in working up serially towards [place]. There is no signs of bleeding and there is no difficulty in passing these. After the 36 [place], a rigid cervical esophagoscope was then passed into the upper esophagus and passed all the way up through the cervical portion of the esophagus beyond the obstructed area. There is no signs of tearing. The scope was removed. The patient was then awakened extubated and transferred to recovery in stable condition.    PATIENT DISPOSITION:  To PACU, stable

## 2015-10-31 NOTE — Discharge Instructions (Signed)
Start with liquids initially and then advance diet as tolerated.

## 2015-10-31 NOTE — Transfer of Care (Signed)
Immediate Anesthesia Transfer of Care Note  Patient: [name]  Procedure(s) Performed: Procedure(s): ESOPHAGOSCOPY WITH DILITATION (N/A)  Patient Location: PACU  Anesthesia Type:General  Level of Consciousness: awake, alert , oriented and patient cooperative  Airway & Oxygen Therapy: Patient Spontanous Breathing and Patient connected to nasal cannula oxygen  Post-op Assessment: Report given to RN and Post -op Vital signs reviewed and stable  Post vital signs: Reviewed and stable  Last Vitals:  Vitals:   10/31/15 1248 10/31/15 1515  BP: 117/77 133/86  Pulse: 75 97  Resp: 16 16  Temp: 36.7 C 36.4 C    Last Pain: There were no vitals filed for this visit.       Complications: No apparent anesthesia complications

## 2015-10-31 NOTE — Anesthesia Preprocedure Evaluation (Addendum)
Anesthesia Evaluation  Patient identified by MRN, date of birth, ID band Patient awake    Reviewed: Allergy & Precautions, NPO status , Patient's Chart, lab work & pertinent test results  History of Anesthesia Complications (+) DIFFICULT AIRWAY  Airway Mallampati: III  TM Distance: <3 FB Neck ROM: Full    Dental no notable dental hx. (+) Edentulous Upper, Dental Advidsory Given   Pulmonary neg pulmonary ROS, shortness of breath, COPD, former smoker,    Pulmonary exam normal breath sounds clear to auscultation       Cardiovascular hypertension, + CAD, + Past MI, + Cardiac Stents, + CABG and + Peripheral Vascular Disease  Normal cardiovascular exam Rhythm:Regular Rate:Normal  Left ventricle: The cavity size was normal. Wall thickness was normal. Systolic function was normal. The estimated ejection fraction was in the range of 50% to 55%. Regional wall motion abnormalities: There is hypokinesis of the mid-apicalanteroseptal myocardium. The transmitral flow pattern was normal. The deceleration time of the early transmitral flow velocity was normal. The pulmonary vein flow pattern was normal. The tissue Doppler parameters were normal. Left ventricular diastolic function parameters were normal.  ------------------------------------------------------------------- Aortic valve: Structurally normal valve. Cusp separation was normal. Doppler: Transvalvular velocity was within the normal range. There was no stenosis. There was no regurgitation.  ------------------------------------------------------------------- Aorta: The aorta was normal, not dilated, and non-diseased.  ------------------------------------------------------------------- Mitral valve: Structurally normal valve. Leaflet separation was normal. Doppler: Transvalvular velocity was within the normal range. There was no evidence for stenosis. There was  no regurgitation. Peak gradient (D): 3 mm Hg.   Neuro/Psych negative neurological ROS  negative psych ROS   GI/Hepatic negative GI ROS, Neg liver ROS, GERD  ,(+) Hepatitis -  Endo/Other  negative endocrine ROS  Renal/GU negative Renal ROS  negative genitourinary   Musculoskeletal negative musculoskeletal ROS (+)   Abdominal   Peds negative pediatric ROS (+)  Hematology negative hematology ROS (+) anemia ,   Anesthesia Other Findings   Reproductive/Obstetrics negative OB ROS                            Anesthesia Physical  Anesthesia Plan  ASA: IV  Anesthesia Plan: General   Post-op Pain Management:    Induction: Intravenous  Airway Management Planned: Oral ETT and Video Laryngoscope Planned  Additional Equipment: Arterial line  Intra-op Plan:   Post-operative Plan: Extubation in OR  Informed Consent: I have reviewed the patients History and Physical, chart, labs and discussed the procedure including the risks, benefits and alternatives for the proposed anesthesia with the patient or authorized representative who has indicated his/her understanding and acceptance.   Dental advisory given and Dental Advisory Given  Plan Discussed with: CRNA, Surgeon and Anesthesiologist  Anesthesia Plan Comments:        Anesthesia Quick Evaluation

## 2015-10-31 NOTE — Interval H&P Note (Signed)
History and Physical Interval Note:  10/31/2015 1:27 PM  [name]  has presented today for surgery, with the diagnosis of DYSPHAGIA  The various methods of treatment have been discussed with the patient and family. After consideration of risks, benefits and other options for treatment, the patient has consented to  Procedure(s): ESOPHAGOSCOPY WITH DILITATION (N/A) as a surgical intervention .  The patient's history has been reviewed, patient examined, no change in status, stable for surgery.  I have reviewed the patient's chart and labs.  Questions were answered to the patient's satisfaction.     [name]

## 2015-10-31 NOTE — H&P (View-Only) (Signed)
  History:  1 year posttreatment, still having trouble swallowing. He has to chew his food aggressively in order to swallow. He is not smoking. He has horrible dentition and has had dental infections. He does not have a dentist.  Physical exam:  There are no palpable masses in the neck.  There is diffuse fibrotic edema.  Oral cavity and pharynx are clear with healthy mucosa, but very dry, and upper dentition in serious disrepair. Indirect laryngoscopy reveals glottic edema without any obvious mucosal lesions identified.  Cords appear to move well.  Assessment/Pln:  One-year posttreatment, no evidence of recurrent disease. Continues to have trouble swallowing. Recommend a barium swallow. He may need dilatation of the esophagus. Also recommend we get him in to see Dr. Enrique Sack for his teeth.

## 2015-11-01 ENCOUNTER — Encounter (HOSPITAL_COMMUNITY): Payer: Self-pay | Admitting: Otolaryngology

## 2015-11-01 ENCOUNTER — Telehealth (HOSPITAL_COMMUNITY): Payer: Self-pay | Admitting: Dentistry

## 2015-11-01 NOTE — Telephone Encounter (Signed)
11/01/2015  Patient:            [name] Date of Birth:  April 26, 1955 MRN:                [id number]   [name] was contacted on his home phone. Patient was offered multiple dental appointment times but did not schedule appointment. Patient will call back to schedule Dental Consult appointment in 2-3 weeks per patient report.  Lattie Haw Ingram/Dr. Enrique Sack

## 2015-11-08 ENCOUNTER — Encounter: Payer: Self-pay | Admitting: Surgery

## 2015-11-12 ENCOUNTER — Ambulatory Visit (HOSPITAL_COMMUNITY)
Admission: RE | Admit: 2015-11-12 | Discharge: 2015-11-12 | Disposition: A | Discharge status: Home / Self Care | Payer: Medicaid Other | Source: Ambulatory Visit | Attending: Surgery | Admitting: Surgery

## 2015-11-12 ENCOUNTER — Ambulatory Visit: Payer: Medicaid Other | Admitting: Surgery

## 2015-11-12 DIAGNOSIS — I6523 Occlusion and stenosis of bilateral carotid arteries: Secondary | ICD-10-CM | POA: Insufficient documentation

## 2015-11-26 ENCOUNTER — Other Ambulatory Visit: Payer: Self-pay | Admitting: *Deleted

## 2015-11-26 ENCOUNTER — Encounter: Payer: Self-pay | Admitting: *Deleted

## 2015-11-26 DIAGNOSIS — I6523 Occlusion and stenosis of bilateral carotid arteries: Secondary | ICD-10-CM

## 2015-11-26 DIAGNOSIS — I739 Peripheral vascular disease, unspecified: Secondary | ICD-10-CM

## 2015-12-21 NOTE — Therapy (Signed)
[facility] [address] [place], [place], [address] Phone: [phone]   Fax:  [phone]  Patient Details  Name: [name] MRN: [id number] Date of Birth: December 01, 1955 Referring Provider:  No ref. provider found  Encounter Date: 12/21/2015  SPEECH THERAPY DISCHARGE SUMMARY  Visits from Start of Care: 1 (eval)  Current functional level related to goals / functional outcomes: Pt was seen for one session, despite being contacted for follow up appointments. SLP goals were as follows. This will serve as pt's formal d/c summary. Pt has not been seen since 11-20-14, and further details re: goals set should be viewed via that document.    Remaining deficits: Unknown as pt has not been seen since November 2016   Education / Equipment: HEP, late effects head/neck radiation on swallowing.  Plan: Patient agrees to discharge.  Patient goals were not met. Patient is being discharged due to not returning since the last visit.  ?????      [place] ,MS, CCC-SLP  12/21/2015, 4:14 PM  [facility] [address] [place], [place], [address] Phone: [phone]   Fax:  [phone]

## 2016-01-24 ENCOUNTER — Other Ambulatory Visit: Payer: Self-pay

## 2016-01-24 ENCOUNTER — Other Ambulatory Visit: Payer: Self-pay | Admitting: *Deleted

## 2016-02-01 ENCOUNTER — Telehealth: Payer: Self-pay

## 2016-02-01 NOTE — Telephone Encounter (Signed)
LVM for pt to return call to set up next appt.  Appt needed for 6 month f/u wirh Dr. Trula Slade and Carotid u/s.

## 2016-02-02 ENCOUNTER — Other Ambulatory Visit: Payer: Self-pay | Admitting: Cardiovascular Disease

## 2016-02-13 ENCOUNTER — Other Ambulatory Visit: Payer: Self-pay | Admitting: *Deleted

## 2016-02-13 DIAGNOSIS — I739 Peripheral vascular disease, unspecified: Secondary | ICD-10-CM

## 2016-02-21 ENCOUNTER — Ambulatory Visit: Payer: MEDICAID | Admitting: Radiation Oncology

## 2016-03-03 ENCOUNTER — Other Ambulatory Visit: Payer: Self-pay | Admitting: Cardiovascular Disease

## 2016-03-04 NOTE — Telephone Encounter (Signed)
Rx(s) sent to pharmacy electronically.  

## 2016-03-20 ENCOUNTER — Encounter: Payer: Self-pay | Admitting: Radiation Oncology

## 2016-03-20 ENCOUNTER — Ambulatory Visit
Admission: RE | Admit: 2016-03-20 | Discharge: 2016-03-20 | Disposition: A | Discharge status: Home / Self Care | Payer: Self-pay | Source: Ambulatory Visit | Attending: Radiation Oncology | Admitting: Radiation Oncology

## 2016-03-20 VITALS — BP 147/86 | HR 72 | Temp 98.2°F | Resp 18 | Wt 160.2 lb

## 2016-03-20 DIAGNOSIS — Z888 Allergy status to other drugs, medicaments and biological substances status: Secondary | ICD-10-CM | POA: Insufficient documentation

## 2016-03-20 DIAGNOSIS — R5383 Other fatigue: Secondary | ICD-10-CM | POA: Insufficient documentation

## 2016-03-20 DIAGNOSIS — Z7982 Long term (current) use of aspirin: Secondary | ICD-10-CM | POA: Insufficient documentation

## 2016-03-20 DIAGNOSIS — R490 Dysphonia: Secondary | ICD-10-CM | POA: Insufficient documentation

## 2016-03-20 DIAGNOSIS — C329 Malignant neoplasm of larynx, unspecified: Secondary | ICD-10-CM | POA: Insufficient documentation

## 2016-03-20 NOTE — Progress Notes (Signed)
[name] here today for 5 month follow up after treatment to larynx.  Patient denies any pain.  Reports having some fatigue but still works daily.   Reports having a slight cough in the mornings that is not productive.  No difficulties swallowing since having his throat stretched.  PEG tube removed.    Appetite is sufficient.  Denies any mouth sores.  Taste has returned to normal.   Vitals:   03/20/16 1111  BP: (!) 147/86  Pulse: 72  Resp: 18  Temp: 98.2 F (36.8 C)  TempSrc: Oral  SpO2: 100%  Weight: 160 lb 3.2 oz (72.7 kg)    Wt Readings from Last 3 Encounters:  03/20/16 160 lb 3.2 oz (72.7 kg)  10/31/15 156 lb 5 oz (70.9 kg)  10/29/15 156 lb 11.2 oz (71.1 kg)

## 2016-03-20 NOTE — Progress Notes (Signed)
Radiation Oncology         [phone] ________________________________  Name: [name] MRN: [id number]  Date: 03/20/2016  DOB: 09/01/1955  Follow-Up Visit Note  CC: Millsaps, Luane School, NP  [name], MD    ICD-9-CM ICD-10-CM   1. 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 Completed 09/05/2014 - 10/23/2014 to the larynx with 70 Gy at 2 Gy for 35 fractions with at risk lymph nodes receiving 63 Gy at 1.8 Gy per fraction and 56 Gy at 1.6 Gy per fraction. The patient was treated by Dr. Pablo Ledger.  Narrative:  The patient returns today for routine follow-up after treatment to his larynx. He denies pain at this time. He reports some fatigue but he continues to work daily. He reports a slight non-productive cough in the morning. He denies any difficulty with swallowing since having his throat stretched. His PEG tube is removed. He notes an adequate appetite and notes his taste has returned to normal. He denies mouth sores.                       ALLERGIES:  is allergic to cetuximab.  Meds: Current Outpatient Prescriptions  Medication Sig Dispense Refill  . ALPRAZolam (XANAX) 0.5 MG tablet Place 1 tablet (0.5 mg total) into feeding tube 3 (three) times daily as needed for anxiety. (Patient taking differently: Take 0.5 mg by mouth 3 (three) times daily as needed for anxiety. Dissolve in water)  0  . aspirin (ASPIRIN CHILDRENS) 81 MG chewable tablet Place 1 tablet (81 mg total) into feeding tube daily. (Patient taking differently: Chew 81 mg by mouth daily. Dissolve in water) 30 tablet 0  . buPROPion (WELLBUTRIN) 100 MG tablet Place 1.5 tablets (150 mg total) into feeding tube 2 (two) times daily. (Patient taking  differently: Take 150 mg by mouth 2 (two) times daily. Dissolve in water) 90 tablet 0  . carvedilol (COREG) 6.25 MG tablet PLACE 1 TABLET INTO THE FEEDING TUBE TWICE DAILY 60 tablet 1  . RaNITidine HCl (ACID REDUCER PO) Take 1 tablet by mouth daily. For acid reflux; dissolved in water    . HYDROcodone-acetaminophen (HYCET) 7.5-325 mg/15 ml solution Take 10 mLs by mouth every 4 (four) hours as needed for moderate pain. (Patient not taking: Reported on 10/31/2015) 120 mL 0   No current facility-administered medications for this encounter.     Physical Findings: The patient is in no acute distress. Patient is alert and oriented.  weight is 160 lb 3.2 oz (72.7 kg). His oral temperature is 98.2 F (36.8 C). His blood pressure is 147/86 (abnormal) and his pulse is 72. His respiration is 18 and oxygen saturation is 100%. .  No significant changes.  Lungs are clear to auscultation bilaterally. Heart has regular rate and rhythm. No palpable cervical, supraclavicular, or axillary adenopathy. Abdomen soft, non-tender, normal bowel sounds.  No palpable adenopathy in the neck or axillary region. Patient has some radiation changes in the anterior neck and some edema in the interior neck region. Oral cavity  reveals mucosal to be dry no mucosal lesions. Patient has poor dentitian with several teeth missing along the maxilla. Patient  is noted to be hoarse, according to patient and family, this has been stable.   Indirect Laryngoscopy: Patient to proceeded to undergo indirect mirror examination through oral cavity. with no obvious tumor. Vocal cords moved well.  Palpation on base of the  tongue regions reveals no palpable signs of recurrence.   Lab Findings: Lab Results  Component Value Date   WBC 5.4 10/29/2015   HGB 12.6 (L) 10/29/2015   HCT 38.2 (L) 10/29/2015   MCV 91.4 10/29/2015   PLT 292 10/29/2015    Radiographic Findings: No results found.  Impression:   No evidence of recurrence on clinical  exam today. Persistent hoarseness but no change.  Plan:  Patient will follow up with Dr. [name] in July. Patient will follow up with radiation oncology in October.  ____________________________________  This document serves as a record of services personally performed by Gery Pray, MD. It was created on his behalf by Bethann Humble, a trained medical scribe. The creation of this record is based on the scribe's personal observations and the provider's statements to them. This document has been checked and approved by the attending provider.

## 2016-04-15 IMAGING — CT CT ABD-PELV W/ CM
1 of 4 series · 13 of 32 positions shown, 18 images · IV contrast (OMNIPAQUE)
Comparison: 08/04/2014.

CLINICAL DATA: Status post exploratory laparotomy with colectomy
and colostomy due to perforation on 08/05/2014. Fever for 24 hours,
evaluate for abscess.

EXAM:
CT ABDOMEN AND PELVIS WITH CONTRAST
TECHNIQUE: Multidetector CT imaging of the abdomen and pelvis was performed
using the standard protocol following bolus administration of
intravenous contrast.
CONTRAST:  100mL OMNIPAQUE IOHEXOL 300 MG/ML  SOLN

[Series 2: rtn ap with st · axial · 0.81mm/px · z∈[-474,-98]mm · 13 of 85 slices shown, 18 images]
[im 5/85  soft-tissue]
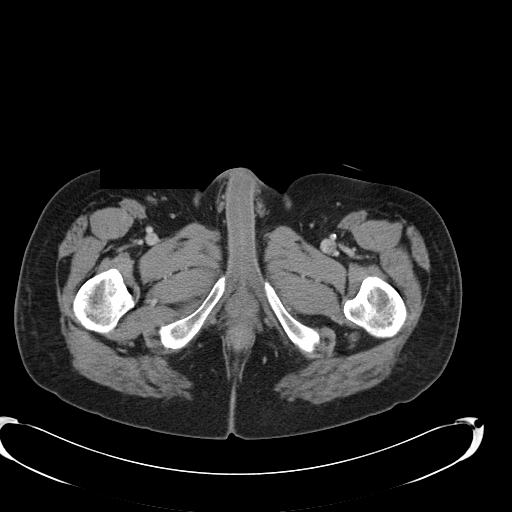
[im 5/85  bone]
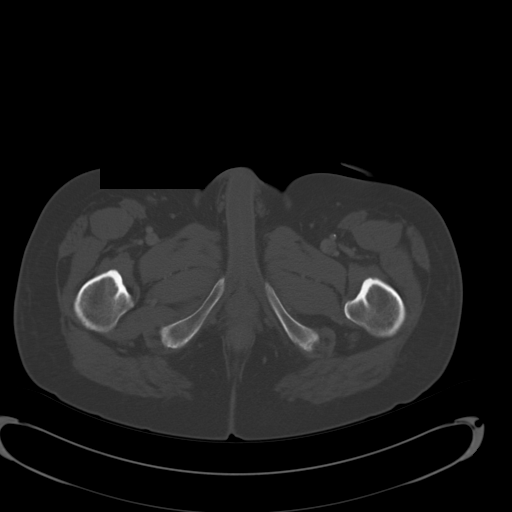
[im 15/85  soft-tissue]
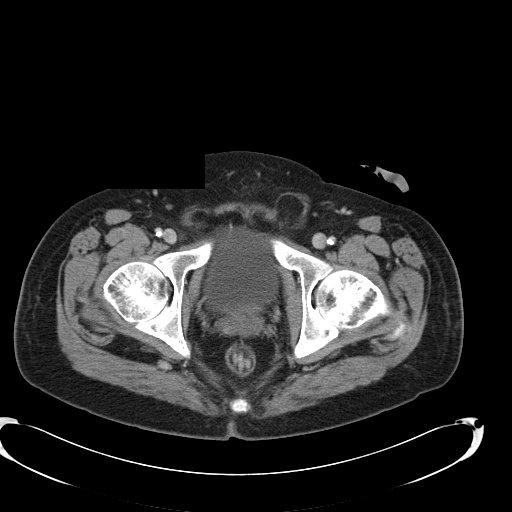
[im 20/85  soft-tissue]
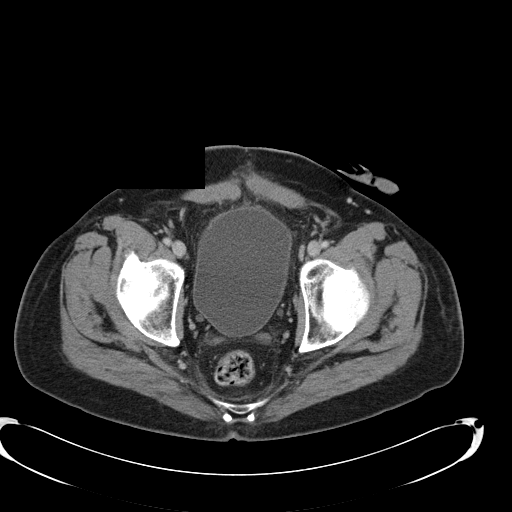
[im 25/85  soft-tissue]
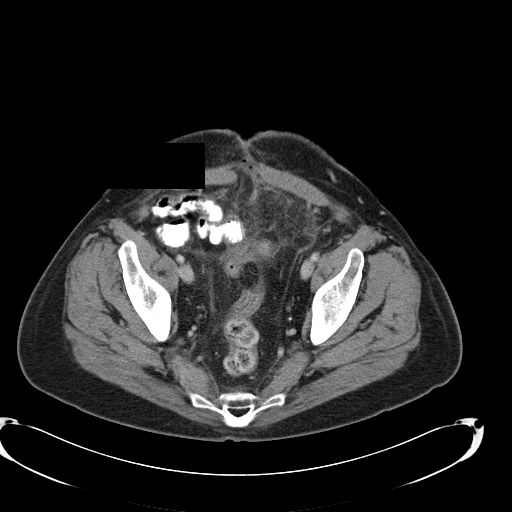
[im 35/85  soft-tissue]
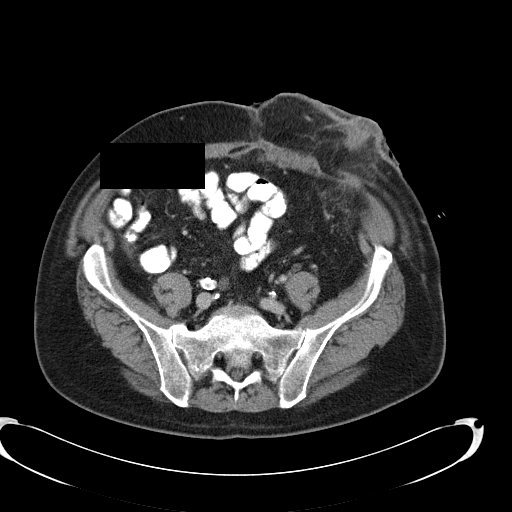
[im 40/85  soft-tissue]
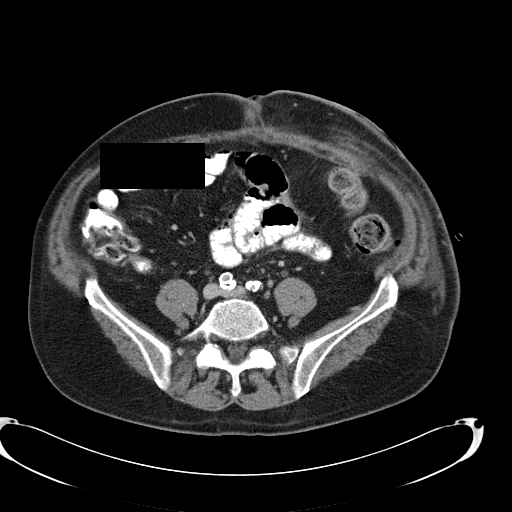
[im 45/85  soft-tissue]
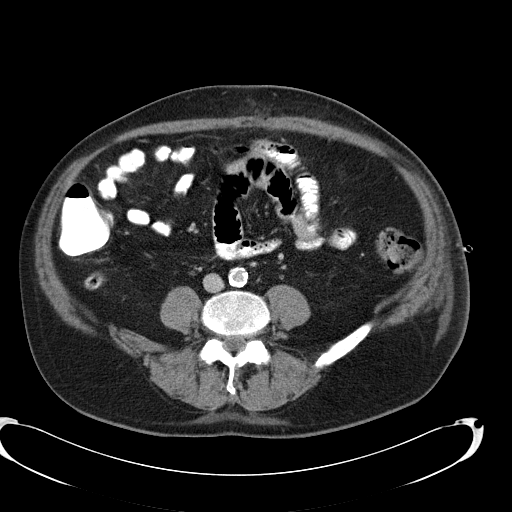
[im 55/85  soft-tissue]
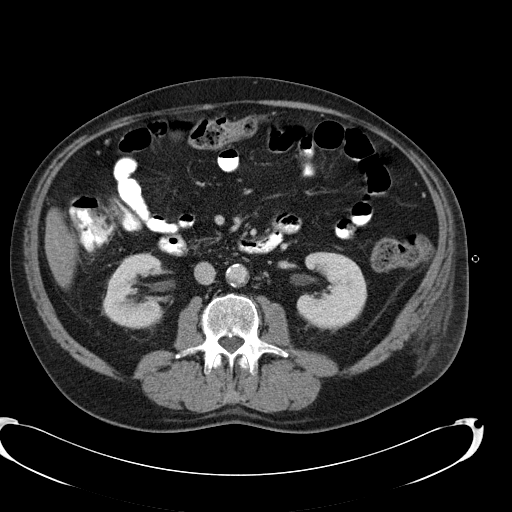
[im 60/85  soft-tissue]
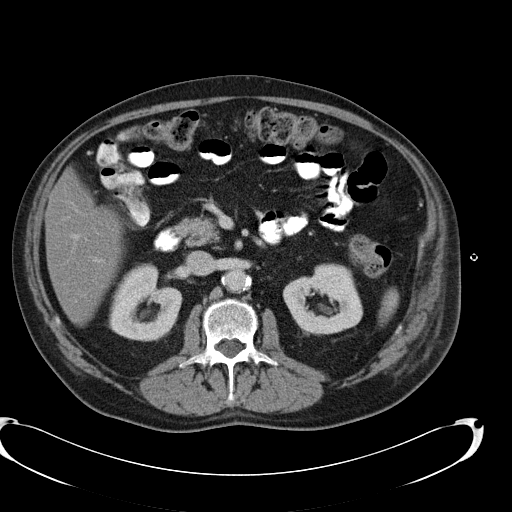
[im 60/85  bone]
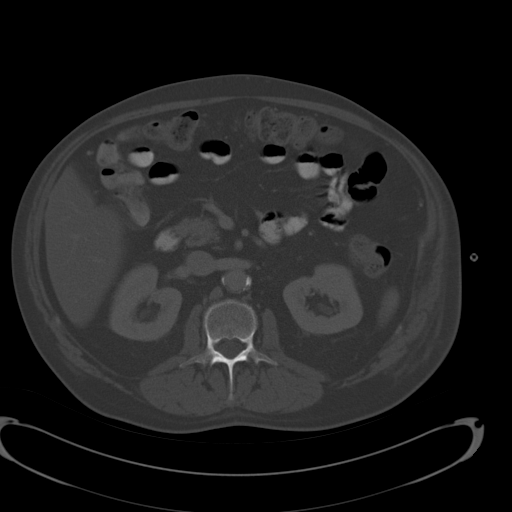
[im 65/85  soft-tissue]
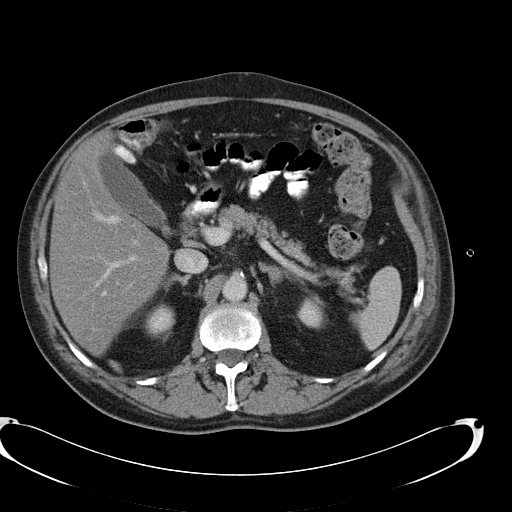
[im 65/85  lung]
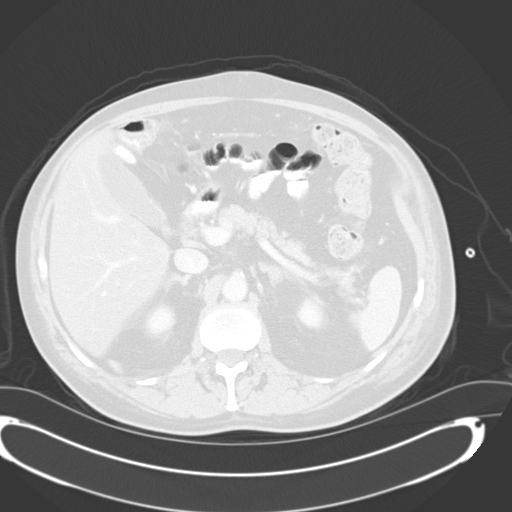
[im 70/85  lung]
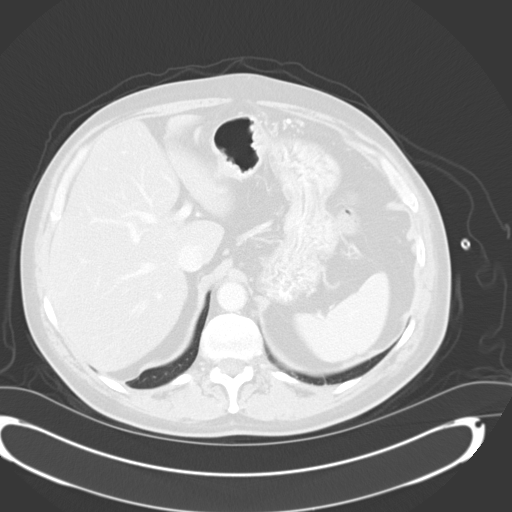
[im 75/85  soft-tissue]
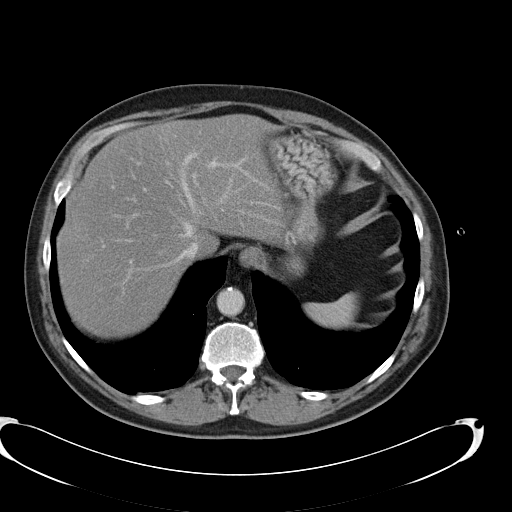
[im 75/85  lung]
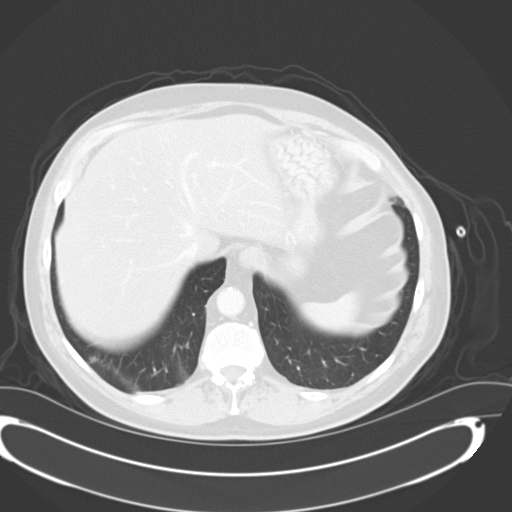
[im 80/85  soft-tissue]
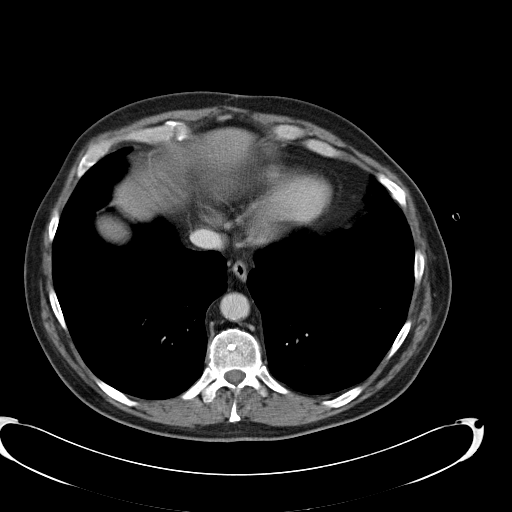
[im 80/85  lung]
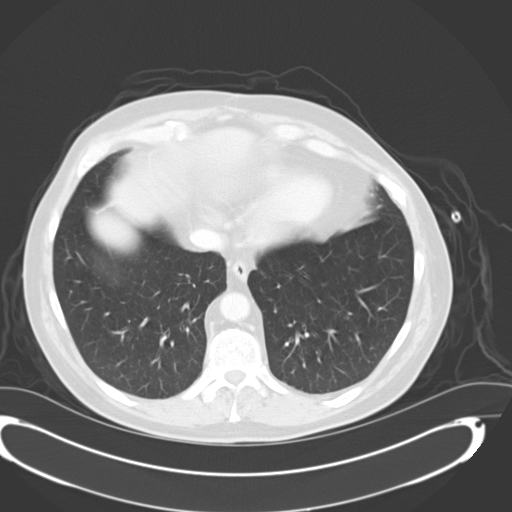

[13 of 32 positions shown; findings below may reference images not displayed]

FINDINGS: Lower chest: Lung bases show scarring in the right lower lobe. Heart
size normal. No pericardial or pleural effusion.

Hepatobiliary: Liver is decreased in attenuation diffusely. Liver
and gallbladder are otherwise unremarkable. No biliary ductal
dilatation.

Pancreas: Negative.

Spleen: Negative.

Adrenals/Urinary Tract: Previously questioned left adrenal nodule is
less well visualized. Adrenal glands and right kidney are
unremarkable. 7 mm low-attenuation lesion in the upper pole left
kidney is too small to characterize. Ureters are decompressed.
Bladder is grossly unremarkable.

Stomach/Bowel: Stomach, small bowel, appendix and proximal colon are
unremarkable. Left lower quadrant colostomy with sigmoid colectomy
and Algent procedure. Faint oral contrast within the rectum is
likely residual examination performed on from 08/04/2014. Mild
residual inflammatory stranding in the anterior left anatomic
pelvis, without discrete fluid collection.

Vascular/Lymphatic: Atherosclerotic calcification of the arterial
vasculature without abdominal aortic aneurysm. Scattered lymph nodes
are not enlarged by CT size criteria.

Reproductive: Prostate is normal in size.

Other: Upper midline ventral abdominal and small bilateral inguinal
hernias contain fat. Ventral midline incisional wound with scattered
subcutaneous air. Again, mild haziness and stranding in the anterior
left anatomic pelvis, likely postoperative in etiology. No organized
fluid collection. Trace pelvic free fluid is seen dependently.
Minimal presacral edema.

Musculoskeletal: No worrisome lytic or sclerotic lesions. There may
be mild changes of avascular necrosis in the left femoral head.
IMPRESSION: 1. Postoperative changes of left lower quadrant colostomy, sigmoid
resection and Lacarra procedure without abscess. Mild stranding
and haziness in the anterior left anatomic pelvis likely
postoperative in etiology.
2. Hepatic steatosis.
3. Upper midline ventral abdominal and small bilateral inguinal
hernias contain fat.
4. Question mild changes of avascular necrosis in the left femoral
head.

## 2016-05-03 ENCOUNTER — Other Ambulatory Visit: Payer: Self-pay | Admitting: Cardiovascular Disease

## 2016-05-05 NOTE — Telephone Encounter (Signed)
Rx(s) sent to pharmacy electronically.  

## 2016-05-19 ENCOUNTER — Ambulatory Visit (HOSPITAL_COMMUNITY): Payer: Self-pay

## 2016-05-19 ENCOUNTER — Ambulatory Visit: Payer: Self-pay | Admitting: Surgery

## 2016-05-21 ENCOUNTER — Other Ambulatory Visit: Payer: Self-pay | Admitting: Surgery

## 2016-05-31 ENCOUNTER — Other Ambulatory Visit: Payer: Self-pay | Admitting: Cardiovascular Disease

## 2016-06-02 NOTE — Telephone Encounter (Signed)
Rx request sent to pharmacy.  

## 2016-06-17 DIAGNOSIS — K5792 Diverticulitis of intestine, part unspecified, without perforation or abscess without bleeding: Secondary | ICD-10-CM | POA: Insufficient documentation

## 2016-06-18 ENCOUNTER — Encounter: Payer: Self-pay | Admitting: Surgery

## 2016-06-23 ENCOUNTER — Ambulatory Visit (INDEPENDENT_AMBULATORY_CARE_PROVIDER_SITE_OTHER): Payer: Self-pay | Admitting: Surgery

## 2016-06-23 ENCOUNTER — Encounter: Payer: Self-pay | Admitting: Surgery

## 2016-06-23 ENCOUNTER — Ambulatory Visit (HOSPITAL_COMMUNITY)
Admission: RE | Admit: 2016-06-23 | Discharge: 2016-06-23 | Disposition: A | Discharge status: Home / Self Care | Payer: Self-pay | Source: Ambulatory Visit | Attending: Family | Admitting: Family

## 2016-06-23 VITALS — BP 125/81 | HR 79 | Temp 97.6°F | Resp 16 | Ht 67.0 in | Wt 166.0 lb

## 2016-06-23 DIAGNOSIS — I6523 Occlusion and stenosis of bilateral carotid arteries: Secondary | ICD-10-CM | POA: Insufficient documentation

## 2016-06-23 DIAGNOSIS — I6521 Occlusion and stenosis of right carotid artery: Secondary | ICD-10-CM

## 2016-06-23 LAB — VAS US CAROTID
LEFT VERTEBRAL DIAS: 10 cm/s
RCCADSYS: -85 cm/s
RCCAPDIAS: 22 cm/s
RIGHT CCA MID DIAS: -25 cm/s
RIGHT ECA DIAS: 50 cm/s
RIGHT VERTEBRAL DIAS: -27 cm/s
Right CCA prox sys: 78 cm/s

## 2016-06-23 MED ORDER — CLOPIDOGREL BISULFATE 75 MG PO TABS: 75.0000 mg | ORAL_TABLET | Freq: Every day | ORAL | 6 refills | Status: DC

## 2016-06-23 NOTE — Progress Notes (Signed)
Vascular and Vein Specialist of [facility]  Patient name: [name] MRN: [id number] DOB: 20-Jul-1955 Sex: male   REASON FOR VISIT:    Carotid stenosis  HISOTRY OF PRESENT ILLNESS:    [name] is a 61 y.o. male who I met in 2017 for clearance for colostomy takedown.  The patient has a history of a left carotid artery occlusion.  When it occluded he had amaurosis fugax, which did resolve.  His ultrasound showed 60-79% right carotid stenosis.  In order to clear him for his surgery, I felt angiography was indicated given the possibility of overestimation of disease secondary to his carotid occlusion.  At angiography he was found to have 70% right carotid stenosis.  The patient has a long-standing history of coronary artery disease.  He is status post CABG approximately 10 years ago.  He is also undergone percutaneous stenting secondary to multiple heart attacks.  He also has a history of peripheral vascular disease and has undergone iliac stenting.  He has a greater than 100-pack-year smoking history.  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  He denies any numbness or weakness in either extremity.  Denies slurred speech.   PAST MEDICAL HISTORY:   Past Medical History:  Diagnosis Date  . Acute encephalopathy, improved 06/20/2013  . Acute MI, troponin > 20, no obvious culprit vessel 06/20/2013  . Anxiety   . Arthritis   . At risk for sudden cardiac death, has lifevest at discharge 06/20/2013  . Carotid stenosis   . Coronary artery disease    s/p CABG 2006; stenting to vein graft 05-2013; NSTEMI 06-2013 in setting of PEA arrest  . Depression   . Difficulty swallowing solids    Pt unable to swallow solid foods  . Dizziness 06/20/2013  . Dyslipidemia 10/09/2013   [place] Study atorvastatin -eIRB # [id number] tablet Take 40 mg by mouth daily.   . Fever, possible aspiration-treated with antibiotics  06/20/2013  . GERD (gastroesophageal reflux disease)   . Heart failure, acute on chronic, systolic and diastolic ([place]) 91/69/4503  . Heart murmur   . History of radiation therapy 09/05/14-10/23/14   larynx 70 Gy  . Hyperlipidemia   . Hypertension   . Hypothermia, induced, post arrest 06/20/2013  . Incisional hernia   . Ischemic cardiomyopathy    echo 06/2013 EF 10-15% (normal 01/2013)  . PAD (peripheral artery disease) ([place]) 10/09/2013   Occluded left common carotid and moderate right internal carotid artery stenosis. Previous stent to the left subclavian artery. Right iliac stent. Bilateral 60-99% renal artery stenosis    . Radiation-induced esophageal stricture   . Shock liver 06/20/2013  . Shortness of breath dyspnea    with exertion  . Squamous cell carcinoma of larynx ([place]) 07/14/2014  . Subclavian artery stenosis, left (HCC)    s/p stenting 2006  . Tobacco abuse    >100 pack year history   . Weakness due to cardiac arrest 06/20/2013  . Wears glasses      FAMILY HISTORY:   Family History  Problem Relation Age of Onset  . Heart disease Maternal Aunt   . Heart attack Mother   . Stroke Mother   . Cancer Father   . Stroke Sister   . Heart Problems Sister     SOCIAL HISTORY:   Social History  Substance Use Topics  . Smoking status: Former Smoker    Packs/day: 4.00    Years: 45.00  Types: Cigarettes    Quit date: 06/09/2013  . Smokeless tobacco: Former Systems developer  . Alcohol use 12.6 oz/week    21 Cans of beer per week     ALLERGIES:   Allergies  Allergen Reactions  . Cetuximab Anaphylaxis    Cardiac arrest 08/02/2014     CURRENT MEDICATIONS:   Current Outpatient Prescriptions  Medication Sig Dispense Refill  . ALPRAZolam (XANAX) 0.5 MG tablet Place 1 tablet (0.5 mg total) into feeding tube 3 (three) times daily as needed for anxiety. (Patient taking differently: Take 0.5 mg by mouth 3 (three) times daily as needed for anxiety. Dissolve in water)  0  . aspirin (ASPIRIN  CHILDRENS) 81 MG chewable tablet Place 1 tablet (81 mg total) into feeding tube daily. (Patient taking differently: Chew 81 mg by mouth daily. Dissolve in water) 30 tablet 0  . buPROPion (WELLBUTRIN) 100 MG tablet Place 1.5 tablets (150 mg total) into feeding tube 2 (two) times daily. (Patient taking differently: Take 150 mg by mouth 2 (two) times daily. Dissolve in water) 90 tablet 0  . carvedilol (COREG) 6.25 MG tablet Take 1 tablet (6.25 mg total) by mouth 2 (two) times daily with a meal. Please schedule appointment for refills. 60 tablet 0  . RaNITidine HCl (ACID REDUCER PO) Take 1 tablet by mouth daily. For acid reflux; dissolved in water    . clopidogrel (PLAVIX) 75 MG tablet Take 1 tablet (75 mg total) by mouth daily. 30 tablet 6   No current facility-administered medications for this visit.     REVIEW OF SYSTEMS:   _0  denotes positive finding, _1  denotes negative finding Cardiac  Comments:  Chest pain or chest pressure:    Shortness of breath upon exertion:    Short of breath when lying flat:    Irregular heart rhythm:        Vascular    Pain in calf, thigh, or hip brought on by ambulation:    Pain in feet at night that wakes you up from your sleep:     Blood clot in your veins:    Leg swelling:         Pulmonary    Oxygen at home:    Productive cough:     Wheezing:         Neurologic    Sudden weakness in arms or legs:     Sudden numbness in arms or legs:     Sudden onset of difficulty speaking or slurred speech:    Temporary loss of vision in one eye:     Problems with dizziness:         Gastrointestinal    Blood in stool:     Vomited blood:         Genitourinary    Burning when urinating:     Blood in urine:        Psychiatric    Major depression:         Hematologic    Bleeding problems:    Problems with blood clotting too easily:        Skin    Rashes or ulcers:        Constitutional    Fever or chills:      PHYSICAL EXAM:   Vitals:    06/23/16 1603 06/23/16 1606  BP: 120/77 125/81  Pulse: 79   Resp: 16   Temp: 97.6 F (36.4 C)   TempSrc: Oral   SpO2: 99%   Weight: 166 lb (  75.3 kg)   Height: _0  (1.702 m)     GENERAL: The patient is a well-nourished male, in no acute distress. The vital signs are documented above. CARDIAC: There is a regular rate and rhythm.  PULMONARY: Non-labored respirations MUSCULOSKELETAL: There are no major deformities or cyanosis. NEUROLOGIC: No focal weakness or paresthesias are detected. SKIN: There are no ulcers or rashes noted. PSYCHIATRIC: The patient has a normal affect.  STUDIES:   I have reviewed his carotid duplex.  There is been progression of the right sided carotid stenosis.  He now is in the 80-99 percent category with known left carotid occlusion  MEDICAL ISSUES:   Asymptomatic right carotid stenosis, greater than 80% in a patient with a history of radiation for throat cancer.  I do not think the patient is a good candidate for endarterectomy because of his neck radiation.  He is best treated with stenting.  I have discussed the risks and benefits of the procedure with the patient including the risk of stroke and access site complications.  I am starting him on Plavix.  He will continue his aspirin.  His procedure will be scheduled in the month of June.    Annamarie Major, MD Vascular and Vein Specialists of [facility] [phone] Pager [phone]

## 2016-06-27 ENCOUNTER — Other Ambulatory Visit: Payer: Self-pay

## 2016-06-30 ENCOUNTER — Other Ambulatory Visit: Payer: Self-pay | Admitting: Cardiovascular Disease

## 2016-07-07 ENCOUNTER — Encounter: Payer: Self-pay | Admitting: Cardiology

## 2016-07-07 ENCOUNTER — Ambulatory Visit (INDEPENDENT_AMBULATORY_CARE_PROVIDER_SITE_OTHER): Payer: Self-pay | Admitting: Cardiology

## 2016-07-07 VITALS — BP 132/80 | HR 66 | Ht 67.0 in | Wt 156.2 lb

## 2016-07-07 DIAGNOSIS — I739 Peripheral vascular disease, unspecified: Secondary | ICD-10-CM

## 2016-07-07 DIAGNOSIS — Z951 Presence of aortocoronary bypass graft: Secondary | ICD-10-CM

## 2016-07-07 DIAGNOSIS — Z9861 Coronary angioplasty status: Secondary | ICD-10-CM

## 2016-07-07 DIAGNOSIS — I2510 Atherosclerotic heart disease of native coronary artery without angina pectoris: Secondary | ICD-10-CM

## 2016-07-07 DIAGNOSIS — C329 Malignant neoplasm of larynx, unspecified: Secondary | ICD-10-CM

## 2016-07-07 DIAGNOSIS — E785 Hyperlipidemia, unspecified: Secondary | ICD-10-CM

## 2016-07-07 DIAGNOSIS — Z01810 Encounter for preprocedural cardiovascular examination: Secondary | ICD-10-CM

## 2016-07-07 DIAGNOSIS — Z8674 Personal history of sudden cardiac arrest: Secondary | ICD-10-CM

## 2016-07-07 DIAGNOSIS — I25810 Atherosclerosis of coronary artery bypass graft(s) without angina pectoris: Secondary | ICD-10-CM

## 2016-07-07 DIAGNOSIS — Z8719 Personal history of other diseases of the digestive system: Secondary | ICD-10-CM

## 2016-07-07 LAB — LIPID PANEL
Chol/HDL Ratio: 3.4 ratio (ref 0.0–5.0)
Cholesterol, Total: 223 mg/dL — ABNORMAL HIGH (ref 100–199)
HDL: 65 mg/dL (ref >39)
LDL Calculated: 141 mg/dL — ABNORMAL HIGH (ref 0–99)
Triglycerides: 86 mg/dL (ref 0–149)
VLDL Cholesterol Cal: 17 mg/dL (ref 5–40)

## 2016-07-07 NOTE — Assessment & Plan Note (Signed)
S/P CFX PCI after his CABG at [place]- S/P SVG-LAD stent 06/02/13 at [facility]- cardiac arrest one week later

## 2016-07-07 NOTE — Assessment & Plan Note (Signed)
Occluded left common carotid and moderate-severe right internal carotid artery stenosis. Previous stent to the left subclavian artery. Right iliac stent. Bilateral 60-99% renal artery stenosis. He now need RICA intervention-stenting

## 2016-07-07 NOTE — Assessment & Plan Note (Signed)
S/P radiation Rx

## 2016-07-07 NOTE — Progress Notes (Signed)
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 He has a history of CAD and is S/P CABG in '06 x 1. He apparently had a subsequent CFX stent in Jan 2015 at [facility]. In May 2015 he went to his Primary Care Provider for shoulder pain and his Troponin was elevated. He was sent to the ER at [facility]. He was admitted and had a cath and subsequent SVG-LAD DES was placed on 06/02/13. He was discharged on Plavix. On 06/09/13 he was found down at home by his son. CPR was started and EMS called. He was noted to be in VF. He was transported to [facility]. He had shock, respiratory failure, shock liver, and acute renal insufficiency. He underwent cath 6/3//15 by Dr Tamala Julian which revealed widely patent saphenous vein graft to the mid LAD with no evidence of ISR. He also had a widely patent proximal to mid circumflex stent.  There was mild to moderate distal left main stenosis in the 30-40% range with 80-90% ostial LAD threatening a small to moderate diagonal territory in the distribution of the wall motion abnormality on LV gram. The mid LAD off the retrograde segment from the graft insertion site was also occluded. He had a widely patent RCA. A Myoview showed no ischemia. His P2Y12 was elevated and he was taken off Plavix and put on Brilinita. He was discharged 06/20/13 with a Armed forces training and education officer. He did well from a cardiac standpoint since.  He had to have a colostomy in July 2016. He had a takedown procedure in May 2017. He has a history of laryngeal CA and received radiation Rx He has known PVD with an occluded LCCA, prior iliac stent, and moderate RICA that Dr Trula Slade has been following.  He is noted to have significant progression in the RICA and Dr Trula Slade has the pt scheduled for carotid stenting this month.   The pt denies chest pain or NTG use. His last functional study was a Myoview in  June 2016 and this did not show any reversible defects according to dr Croitoru's office note 07/13/14. Echo July 2016 showed an EF of 50-55%.    Current Outpatient Prescriptions  Medication Sig Dispense Refill  . ALPRAZolam (XANAX) 0.5 MG tablet Take 0.5 mg by mouth 3 (three) times daily as needed for anxiety.    Marland Kitchen aspirin EC 81 MG tablet Take 81 mg by mouth daily.    Marland Kitchen buPROPion (WELLBUTRIN) 100 MG tablet Take 100 mg by mouth 2 (two) times daily.    . carvedilol (COREG) 6.25 MG tablet TAKE 1 TABLET(6.25 MG) BY MOUTH TWICE DAILY WITH A MEAL 60 tablet 0  . clopidogrel (PLAVIX) 75 MG tablet Take 1 tablet (75 mg total) by mouth daily. 30 tablet 6  . omeprazole (PRILOSEC OTC) 20 MG tablet Take 20 mg by mouth daily.     No current facility-administered medications for this visit.     Allergies  Allergen Reactions  . Cetuximab Anaphylaxis    Cardiac arrest 08/02/2014    Past Medical History:  Diagnosis Date  . Acute encephalopathy, improved 06/20/2013  . Acute MI, troponin > 20, no obvious culprit vessel 06/20/2013  . Anxiety   . Arthritis   . At risk for sudden cardiac death, has lifevest at discharge 06/20/2013  . Carotid stenosis   . Coronary artery disease    s/p  CABG 2006; stenting to vein graft 05-2013; NSTEMI 06-2013 in setting of PEA arrest  . Depression   . Difficulty swallowing solids    Pt unable to swallow solid foods  . Dizziness 06/20/2013  . Dyslipidemia 10/09/2013   [place] Study atorvastatin -eIRB # [id number] tablet Take 40 mg by mouth daily.   . Fever, possible aspiration-treated with antibiotics 06/20/2013  . GERD (gastroesophageal reflux disease)   . Heart failure, acute on chronic, systolic and diastolic ([place]) 29/93/7169  . Heart murmur   . History of radiation therapy 09/05/14-10/23/14   larynx 70 Gy  . Hyperlipidemia   . Hypertension   . Hypothermia, induced, post arrest 06/20/2013  . Incisional hernia   . Ischemic cardiomyopathy    echo 06/2013 EF 10-15% (normal 01/2013)  .  PAD (peripheral artery disease) ([place]) 10/09/2013   Occluded left common carotid and moderate right internal carotid artery stenosis. Previous stent to the left subclavian artery. Right iliac stent. Bilateral 60-99% renal artery stenosis    . Radiation-induced esophageal stricture   . Shock liver 06/20/2013  . Shortness of breath dyspnea    with exertion  . Squamous cell carcinoma of larynx ([place]) 07/14/2014  . Subclavian artery stenosis, left (HCC)    s/p stenting 2006  . Tobacco abuse    >100 pack year history   . Weakness due to cardiac arrest 06/20/2013  . Wears glasses     Social History   Social History  . Marital status: Divorced    Spouse name: N/A  . Number of children: 78  . Years of education: 10   Occupational History  . carpenter    Social History Main Topics  . Smoking status: Former Smoker    Packs/day: 4.00    Years: 45.00    Types: Cigarettes    Quit date: 06/09/2013  . Smokeless tobacco: Former Systems developer  . Alcohol use 12.6 oz/week    21 Cans of beer per week  . Drug use: No  . Sexual activity: No   Other Topics Concern  . Not on file   Social History Narrative  . No narrative on file     Family History  Problem Relation Age of Onset  . Heart disease Maternal Aunt   . Heart attack Mother   . Stroke Mother   . Cancer Father   . Stroke Sister   . Heart Problems Sister      Review of Systems: General: negative for chills, fever, night sweats or weight changes.  Cardiovascular: negative for chest pain, dyspnea on exertion, edema, orthopnea, palpitations, paroxysmal nocturnal dyspnea or shortness of breath Dermatological: negative for rash Respiratory: negative for cough or wheezing Urologic: negative for hematuria Abdominal: negative for nausea, vomiting, diarrhea, bright red blood per rectum, melena, or hematemesis Neurologic: negative for visual changes, syncope, or dizziness All other systems reviewed and are otherwise negative except as noted  above.    Blood pressure 132/80, pulse 66, height 5\' 7"  (1.702 m), weight 156 lb 3.2 oz (70.9 kg).  General appearance: alert, cooperative, appears older than stated age and no distress Neck: no JVD and RCA bruit Lungs: decreased breath sounds Heart: regular rate and rhythm Abdomen: multiple surgical scars, small incisional hernia Extremities: extremities normal, atraumatic, no cyanosis or edema Skin: Skin color, texture, turgor normal. No rashes or lesions Neurologic: Grossly normal  EKG NSR, septal MI  ASSESSMENT AND PLAN:   Pre-operative cardiovascular examination Pt seen today prior to scheduled RCA stenting  Hx of CABG x  1 '06 SVG-LAD  CAD S/P percutaneous coronary angioplasty S/P CFX PCI after his CABG at [facility]- S/P SVG-LAD stent 06/02/13 at [facility]- cardiac arrest one week later  History of cardiac arrest-2015 Cardiac arrest one week post PCI-06/09/16- Cath 06/15/13 by Dr Tamala Julian showed widely patent SVG-LAD stent and widely patent CFX stent. The ostial LAD had high grade disease. Plan was fo medical Rx and he has done well. Low risk Stress in 2016 and preserved LVF  History of small bowel obstruction Pt had colostomy 7/16 with subsequent take down May 2017  PAD (peripheral artery disease) (HCC) Occluded left common carotid and moderate-severe right internal carotid artery stenosis. Previous stent to the left subclavian artery. Right iliac stent. Bilateral 60-99% renal artery stenosis. He now need RICA intervention-stenting  Laryngeal cancer S/P radiation Rx   PLAN Pt has been stable from a cardiac standpoint- no recent chest pain. He should be an acceptable risk for proposed carotid stenting. I did order a lipid panel today, he is not sure why statin Rx was dropped off.   Kerin Ransom PA-C 07/07/2016 11:18 AM

## 2016-07-07 NOTE — Assessment & Plan Note (Signed)
Pt had colostomy 7/16 with subsequent take down May 2017

## 2016-07-07 NOTE — Assessment & Plan Note (Signed)
Pt seen today prior to scheduled RCA stenting

## 2016-07-07 NOTE — Assessment & Plan Note (Signed)
SVG-LAD

## 2016-07-07 NOTE — Patient Instructions (Signed)
Medication Instructions:  Your physician recommends that you continue on your current medications as directed. Please refer to the Current Medication list given to you today.  Labwork: Please have FASTING labs today (lipids)  Testing/Procedures: NONE  Follow-Up: Your physician wants you to follow-up in: 6 MONTHS with Dr. Sallyanne Kuster. You will receive a reminder letter in the mail two months in advance. If you don't receive a letter, please call our office to schedule the follow-up appointment.   Any Other Special Instructions Will Be Listed Below (If Applicable).     If you need a refill on your cardiac medications before your next appointment, please call your pharmacy.

## 2016-07-07 NOTE — Assessment & Plan Note (Addendum)
Cardiac arrest one week post PCI-06/09/16- Cath 06/15/13 by Dr Tamala Julian showed widely patent SVG-LAD stent and widely patent CFX stent. The ostial LAD had high grade disease. Plan was fo medical Rx and he has done well. Low risk Stress in 2016 and preserved LVF

## 2016-07-08 ENCOUNTER — Encounter: Payer: Self-pay | Admitting: Cardiology

## 2016-07-08 ENCOUNTER — Telehealth: Payer: Self-pay | Admitting: Cardiology

## 2016-07-08 DIAGNOSIS — E785 Hyperlipidemia, unspecified: Secondary | ICD-10-CM

## 2016-07-08 DIAGNOSIS — Z79899 Other long term (current) drug therapy: Secondary | ICD-10-CM

## 2016-07-08 MED ORDER — ATORVASTATIN CALCIUM 80 MG PO TABS: 80.0000 mg | ORAL_TABLET | Freq: Every day | ORAL | 3 refills | Status: DC

## 2016-07-08 NOTE — Telephone Encounter (Signed)
Erlene Quan, PA-C  Theodore Demark, RN        LDL goal is 70 or less- start Lipitor 80 mg daily, check lipids and CMET in 3 months.   Kerin Ransom PA-C  07/07/2016  5:08 PM    Patient called. Agrees w/plan. Med ordered. Repeat labs ordered + lab slips/copy of results mailed to patient.

## 2016-07-10 ENCOUNTER — Inpatient Hospital Stay (HOSPITAL_COMMUNITY)
Admission: AD | Admit: 2016-07-10 | Discharge: 2016-07-13 | DRG: 039 | Disposition: A | Discharge status: Home / Self Care | Payer: Self-pay | Source: Ambulatory Visit | Attending: Surgery | Admitting: Surgery

## 2016-07-10 ENCOUNTER — Inpatient Hospital Stay (HOSPITAL_COMMUNITY)
Admission: AD | Disposition: A | Discharge status: Home / Self Care | Payer: Self-pay | Source: Ambulatory Visit | Attending: Surgery

## 2016-07-10 DIAGNOSIS — R001 Bradycardia, unspecified: Secondary | ICD-10-CM | POA: Diagnosis present

## 2016-07-10 DIAGNOSIS — Z951 Presence of aortocoronary bypass graft: Secondary | ICD-10-CM

## 2016-07-10 DIAGNOSIS — Z87891 Personal history of nicotine dependence: Secondary | ICD-10-CM

## 2016-07-10 DIAGNOSIS — I6529 Occlusion and stenosis of unspecified carotid artery: Secondary | ICD-10-CM | POA: Diagnosis present

## 2016-07-10 DIAGNOSIS — G453 Amaurosis fugax: Secondary | ICD-10-CM

## 2016-07-10 DIAGNOSIS — Z923 Personal history of irradiation: Secondary | ICD-10-CM

## 2016-07-10 DIAGNOSIS — I10 Essential (primary) hypertension: Secondary | ICD-10-CM | POA: Diagnosis present

## 2016-07-10 DIAGNOSIS — Z8521 Personal history of malignant neoplasm of larynx: Secondary | ICD-10-CM

## 2016-07-10 DIAGNOSIS — I252 Old myocardial infarction: Secondary | ICD-10-CM

## 2016-07-10 DIAGNOSIS — I739 Peripheral vascular disease, unspecified: Secondary | ICD-10-CM | POA: Diagnosis present

## 2016-07-10 DIAGNOSIS — Z885 Allergy status to narcotic agent status: Secondary | ICD-10-CM

## 2016-07-10 DIAGNOSIS — Z955 Presence of coronary angioplasty implant and graft: Secondary | ICD-10-CM

## 2016-07-10 DIAGNOSIS — Z8674 Personal history of sudden cardiac arrest: Secondary | ICD-10-CM

## 2016-07-10 DIAGNOSIS — I2510 Atherosclerotic heart disease of native coronary artery without angina pectoris: Secondary | ICD-10-CM | POA: Diagnosis present

## 2016-07-10 DIAGNOSIS — I6521 Occlusion and stenosis of right carotid artery: Secondary | ICD-10-CM

## 2016-07-10 DIAGNOSIS — Z7982 Long term (current) use of aspirin: Secondary | ICD-10-CM

## 2016-07-10 DIAGNOSIS — I9581 Postprocedural hypotension: Secondary | ICD-10-CM | POA: Diagnosis not present

## 2016-07-10 DIAGNOSIS — K219 Gastro-esophageal reflux disease without esophagitis: Secondary | ICD-10-CM | POA: Diagnosis present

## 2016-07-10 DIAGNOSIS — Z7902 Long term (current) use of antithrombotics/antiplatelets: Secondary | ICD-10-CM

## 2016-07-10 DIAGNOSIS — E785 Hyperlipidemia, unspecified: Secondary | ICD-10-CM | POA: Diagnosis present

## 2016-07-10 DIAGNOSIS — Z79899 Other long term (current) drug therapy: Secondary | ICD-10-CM

## 2016-07-10 DIAGNOSIS — F17210 Nicotine dependence, cigarettes, uncomplicated: Secondary | ICD-10-CM | POA: Diagnosis present

## 2016-07-10 HISTORY — PX: CAROTID PTA/STENT INTERVENTION: CATH118231

## 2016-07-10 HISTORY — PX: CAROTID ANGIOGRAPHY: CATH118230

## 2016-07-10 LAB — POCT I-STAT, CHEM 8
BUN: 17 mg/dL (ref 6–20)
CHLORIDE: 100 mmol/L — AB (ref 101–111)
Calcium, Ion: 1.18 mmol/L (ref 1.15–1.40)
Creatinine, Ser: 1.2 mg/dL (ref 0.61–1.24)
Glucose, Bld: 95 mg/dL (ref 65–99)
HCT: 36 % — ABNORMAL LOW (ref 39.0–52.0)
HEMOGLOBIN: 12.2 g/dL — AB (ref 13.0–17.0)
POTASSIUM: 4.4 mmol/L (ref 3.5–5.1)
SODIUM: 137 mmol/L (ref 135–145)
TCO2: 26 mmol/L (ref 0–100)

## 2016-07-10 LAB — POCT ACTIVATED CLOTTING TIME: Activated Clotting Time: 400 seconds

## 2016-07-10 SURGERY — CAROTID PTA/STENT INTERVENTION
Anesthesia: LOCAL

## 2016-07-10 MED ORDER — OXYCODONE HCL 5 MG PO TABS: 5.0000 mg | ORAL_TABLET | ORAL | Status: DC | PRN

## 2016-07-10 MED ORDER — GUAIFENESIN-DM 100-10 MG/5ML PO SYRP: 15.0000 mL | ORAL_SOLUTION | ORAL | Status: DC | PRN

## 2016-07-10 MED ORDER — ALPRAZOLAM 0.5 MG PO TABS
0.5000 mg | ORAL_TABLET | Freq: Three times a day (TID) | ORAL | Status: DC
  Administered 2016-07-10 – 2016-07-13 (×8): 0.5 mg via ORAL
  Filled 2016-07-10 (×8): qty 1

## 2016-07-10 MED ORDER — ATROPINE SULFATE 1 MG/10ML IJ SOSY
PREFILLED_SYRINGE | INTRAMUSCULAR | Status: AC
  Filled 2016-07-10: qty 10

## 2016-07-10 MED ORDER — DOPAMINE-DEXTROSE 3.2-5 MG/ML-% IV SOLN
INTRAVENOUS | Status: AC
  Filled 2016-07-10: qty 250

## 2016-07-10 MED ORDER — ACETAMINOPHEN 325 MG RE SUPP
325.0000 mg | RECTAL | Status: DC | PRN
  Filled 2016-07-10: qty 2

## 2016-07-10 MED ORDER — DOCUSATE SODIUM 100 MG PO CAPS
100.0000 mg | ORAL_CAPSULE | Freq: Every day | ORAL | Status: DC
  Administered 2016-07-11 – 2016-07-13 (×3): 100 mg via ORAL
  Filled 2016-07-10 (×3): qty 1

## 2016-07-10 MED ORDER — PHENOL 1.4 % MT LIQD: 1.0000 | OROMUCOSAL | Status: DC | PRN

## 2016-07-10 MED ORDER — FAMOTIDINE 20 MG PO TABS
20.0000 mg | ORAL_TABLET | Freq: Every day | ORAL | Status: DC
  Administered 2016-07-11 – 2016-07-13 (×3): 20 mg via ORAL
  Filled 2016-07-10 (×3): qty 1

## 2016-07-10 MED ORDER — IODIXANOL 320 MG/ML IV SOLN
INTRAVENOUS | Status: DC | PRN
  Administered 2016-07-10: 105 mL via INTRA_ARTERIAL

## 2016-07-10 MED ORDER — LABETALOL HCL 5 MG/ML IV SOLN: 10.0000 mg | INTRAVENOUS | Status: DC | PRN

## 2016-07-10 MED ORDER — ONDANSETRON HCL 4 MG/2ML IJ SOLN
INTRAMUSCULAR | Status: AC
  Filled 2016-07-10: qty 2

## 2016-07-10 MED ORDER — ASPIRIN EC 81 MG PO TBEC
81.0000 mg | DELAYED_RELEASE_TABLET | Freq: Every day | ORAL | Status: DC
  Administered 2016-07-11 – 2016-07-13 (×3): 81 mg via ORAL
  Filled 2016-07-10 (×3): qty 1

## 2016-07-10 MED ORDER — ALUM & MAG HYDROXIDE-SIMETH 200-200-20 MG/5ML PO SUSP: 15.0000 mL | ORAL | Status: DC | PRN

## 2016-07-10 MED ORDER — CLOPIDOGREL BISULFATE 75 MG PO TABS
75.0000 mg | ORAL_TABLET | Freq: Every day | ORAL | Status: DC
  Administered 2016-07-11 – 2016-07-13 (×3): 75 mg via ORAL
  Filled 2016-07-10 (×3): qty 1

## 2016-07-10 MED ORDER — METOPROLOL TARTRATE 5 MG/5ML IV SOLN: 2.0000 mg | INTRAVENOUS | Status: DC | PRN

## 2016-07-10 MED ORDER — SODIUM CHLORIDE 0.9 % IV SOLN
INTRAVENOUS | Status: DC | PRN
  Administered 2016-07-10: 1.75 mg/kg/h via INTRAVENOUS

## 2016-07-10 MED ORDER — HYDRALAZINE HCL 20 MG/ML IJ SOLN: 5.0000 mg | INTRAMUSCULAR | Status: DC | PRN

## 2016-07-10 MED ORDER — SODIUM CHLORIDE 0.9 % IV SOLN
INTRAVENOUS | Status: DC
  Administered 2016-07-10: 75 mL/h via INTRAVENOUS
  Administered 2016-07-11: 15:00:00 via INTRAVENOUS

## 2016-07-10 MED ORDER — NOREPINEPHRINE BITARTRATE 1 MG/ML IV SOLN
INTRAVENOUS | Status: AC
  Filled 2016-07-10: qty 4

## 2016-07-10 MED ORDER — DOPAMINE-DEXTROSE 3.2-5 MG/ML-% IV SOLN
5.0000 ug/kg/min | INTRAVENOUS | Status: DC
  Administered 2016-07-10: 2 ug/kg/min via INTRAVENOUS
  Administered 2016-07-11: 5 ug/kg/min via INTRAVENOUS
  Filled 2016-07-10: qty 250

## 2016-07-10 MED ORDER — DOPAMINE-DEXTROSE 3.2-5 MG/ML-% IV SOLN
0.0000 ug/kg/min | Freq: Once | INTRAVENOUS | Status: AC
Start: 2016-07-10 — End: 2016-07-10
  Administered 2016-07-10: 5 ug/kg/min via INTRAVENOUS

## 2016-07-10 MED ORDER — MORPHINE SULFATE (PF) 10 MG/ML IV SOLN: 2.0000 mg | INTRAVENOUS | Status: DC | PRN

## 2016-07-10 MED ORDER — BIVALIRUDIN TRIFLUOROACETATE 250 MG IV SOLR
INTRAVENOUS | Status: AC
  Filled 2016-07-10: qty 250

## 2016-07-10 MED ORDER — ATORVASTATIN CALCIUM 80 MG PO TABS
80.0000 mg | ORAL_TABLET | Freq: Every day | ORAL | Status: DC
  Administered 2016-07-11 – 2016-07-13 (×3): 80 mg via ORAL
  Filled 2016-07-10 (×3): qty 1

## 2016-07-10 MED ORDER — ATROPINE SULFATE 1 MG/10ML IJ SOSY
PREFILLED_SYRINGE | INTRAMUSCULAR | Status: DC | PRN
  Administered 2016-07-10 (×3): 0.5 mg via INTRAVENOUS

## 2016-07-10 MED ORDER — HEPARIN (PORCINE) IN NACL 2-0.9 UNIT/ML-% IJ SOLN
INTRAMUSCULAR | Status: AC
  Filled 2016-07-10: qty 1000

## 2016-07-10 MED ORDER — SODIUM CHLORIDE 0.9 % IV SOLN
INTRAVENOUS | Status: DC
  Administered 2016-07-10 (×2): via INTRAVENOUS

## 2016-07-10 MED ORDER — MORPHINE SULFATE (PF) 2 MG/ML IV SOLN: 2.0000 mg | INTRAVENOUS | Status: DC | PRN

## 2016-07-10 MED ORDER — LIDOCAINE HCL (PF) 1 % IJ SOLN
INTRAMUSCULAR | Status: DC | PRN
  Administered 2016-07-10: 18 mL

## 2016-07-10 MED ORDER — LIDOCAINE HCL (PF) 1 % IJ SOLN
INTRAMUSCULAR | Status: AC
  Filled 2016-07-10: qty 30

## 2016-07-10 MED ORDER — HEPARIN (PORCINE) IN NACL 2-0.9 UNIT/ML-% IJ SOLN
INTRAMUSCULAR | Status: AC | PRN
  Administered 2016-07-10: 1000 mL

## 2016-07-10 MED ORDER — BIVALIRUDIN BOLUS VIA INFUSION - CUPID
INTRAVENOUS | Status: DC | PRN
  Administered 2016-07-10: 53.4 mg via INTRAVENOUS

## 2016-07-10 MED ORDER — ONDANSETRON HCL 4 MG/2ML IJ SOLN
4.0000 mg | Freq: Four times a day (QID) | INTRAMUSCULAR | Status: DC | PRN
  Administered 2016-07-10: 4 mg via INTRAVENOUS

## 2016-07-10 MED ORDER — ACETAMINOPHEN 325 MG PO TABS: 325.0000 mg | ORAL_TABLET | ORAL | Status: DC | PRN

## 2016-07-10 MED ORDER — BUPROPION HCL 100 MG PO TABS
100.0000 mg | ORAL_TABLET | Freq: Three times a day (TID) | ORAL | Status: DC
  Administered 2016-07-10 – 2016-07-13 (×8): 100 mg via ORAL
  Filled 2016-07-10 (×8): qty 1

## 2016-07-10 MED ORDER — SODIUM CHLORIDE 0.9 % IV SOLN: 500.0000 mL | Freq: Once | INTRAVENOUS | Status: DC | PRN

## 2016-07-10 SURGICAL SUPPLY — 23 items
BALLN SAPPHIRE 3.0X20 (BALLOONS) ×3
BALLN VIATRAC 5X20X135 (BALLOONS) ×3
BALLOON SAPPHIRE 3.0X20 (BALLOONS) ×2 IMPLANT
BALLOON VIATRAC 5X20X135 (BALLOONS) ×2 IMPLANT
CATH ANGIO 5F BER2 100CM (CATHETERS) ×3 IMPLANT
CATH ANGIO 5F PIGTAIL 100CM (CATHETERS) ×3 IMPLANT
COVER PRB 48X5XTLSCP FOLD TPE (BAG) ×2 IMPLANT
COVER PROBE 5X48 (BAG) ×1
DEVICE EMBOSHIELD NAV6 4.0-7.0 (WIRE) ×3 IMPLANT
KIT ENCORE 26 ADVANTAGE (KITS) ×3 IMPLANT
KIT MICROINTRODUCER STIFF 5F (SHEATH) ×3 IMPLANT
KIT PV (KITS) ×3 IMPLANT
SHEATH PINNACLE 6F 10CM (SHEATH) ×3 IMPLANT
SHEATH SHUTTLE SELECT 6F (SHEATH) ×3 IMPLANT
SHIELD RADPAD SCOOP 12X17 (MISCELLANEOUS) ×6 IMPLANT
STENT XACT CAR 9-7X30X136 (Permanent Stent) ×3 IMPLANT
STOPCOCK MORSE 400PSI 3WAY (MISCELLANEOUS) ×3 IMPLANT
SYRINGE MEDRAD AVANTA MACH 7 (SYRINGE) ×3 IMPLANT
TRANSDUCER W/STOPCOCK (MISCELLANEOUS) ×3 IMPLANT
TRAY PV CATH (CUSTOM PROCEDURE TRAY) ×3 IMPLANT
TUBING CIL FLEX 10 FLL-RA (TUBING) ×3 IMPLANT
WIRE AMPLATZ SSTIFF .035X260CM (WIRE) ×3 IMPLANT
WIRE BENTSON .035X145CM (WIRE) ×3 IMPLANT

## 2016-07-10 NOTE — CV Procedure (Signed)
Order for sheath removal, procedure explained to patient and Rt femoral artery access site assessed: level 0, palpable dorsalis pedis. 6French Sheath removed and manual pressure applied for 20 minutes. Pre, peri, & post procedural vitals: HR 58, RR 12, O2 Sat upper 99, BP 90/54/ fluid bolus was given prior to sheath pull, Pain 0. BP remained stable thru out pull. Distal pulses remained intact after sheath removal. Access site level 0 and dressed with 4X4 gauze and tegaderm.  Suella Broad, RN confirmed condition of site. Post procedural instructions discussed and return demonstration from patient. A additional fluid bolus was given for post  BP pressure going down to systolic of 75. Patient without pain.

## 2016-07-10 NOTE — Op Note (Signed)
    Patient name: [name] MRN: [id number] DOB: 1955/02/12 Sex: male  07/10/2016 Pre-operative Diagnosis: asymptomatic right carotid stenosis Post-operative diagnosis:  Same Surgeon:  Milderd Meager Procedure Performed:  1.  U/s guided access right femoral artery  2.  Aortic arch angiogram  3.  Right carotid stenting with distal embolic protection   Indications:  The patient has a known left carotid occlusion.  He has undergone head and neck radiation therapy for cancer.  He comes in today for progressive right carotid stenosis  Procedure:  The patient was identified in the holding area and taken to room 8.  The patient was then placed supine on the table and prepped and draped in the usual sterile fashion.  A time out was called.  Ultrasound was used to evaluate the right common femoral artery.  It was patent .  A digital ultrasound image was acquired.  A micropuncture needle was used to access the right common femoral artery under ultrasound guidance.  An 018 wire was advanced without resistance and a micropuncture sheath was placed.  The 018 wire was removed and a benson wire was placed.  The micropuncture sheath was exchanged for a 5 french sheath.  A pigtail catheter was advanced into the ascending aorta and an aortic arch angiogram was performed.  Next a Berenstein 2 catheter was used to select the innominate artery and then advanced into the right common carotid artery and right carotid antrum was performed with angiogram imaging.  Intracranial imaging will be separately dictated by the neuroradiology area  Findings:   Aortogram:  Type I aortic arch is identified.  The innominate artery is widely patent.  There is occlusion of the left common carotid artery.  There is a stent within the left subclavian artery which is widely patent.  Right carotid artery:  The right common carotid artery is widely patent.  The origin to the external carotid artery is widely patent.   Just beyond the carotid bifurcation there is a 90% stenosis within the internal carotid artery.  The remaining portion of the extracranial internal carotid artery is widely patent.  Left carotid artery:  Occluded  Intervention:  Please see Dr. Kennon Holter notes for details of the intervention.  Impression:  #1  90% right carotid stenosis successfully treated with carotid stenting with distal embolic protection.  Residual stenosis was less than 15%.  The lesion was very calcified.  A 9 x 7 XACT was utilized   [name], M.D. [facility] of [place] Office: [phone] Pager:  [phone]

## 2016-07-10 NOTE — Interval H&P Note (Signed)
History and Physical Interval Note:  07/10/2016 8:32 AM  [name]  has presented today for surgery, with the diagnosis of carotid stenosis right  The various methods of treatment have been discussed with the patient and family. After consideration of risks, benefits and other options for treatment, the patient has consented to  Procedure(s): Carotid PTA/Stent Intervention (N/A) as a surgical intervention .  The patient's history has been reviewed, patient examined, no change in status, stable for surgery.  I have reviewed the patient's chart and labs.  Questions were answered to the patient's satisfaction.     Annamarie Major

## 2016-07-10 NOTE — H&P (View-Only) (Signed)
 [facility] [place]  Patient name: [name] MRN: [id number] DOB: 10/03/1955 Sex: male   REASON FOR VISIT:    Carotid stenosis  HISOTRY OF PRESENT ILLNESS:    [name] is a 61 y.o. male who I met in 2017 for clearance for colostomy takedown.  The patient has a history of a left carotid artery occlusion.  When it occluded he had amaurosis fugax, which did resolve.  His ultrasound showed 60-79% right carotid stenosis.  In order to clear him for his surgery, I felt angiography was indicated given the possibility of overestimation of disease secondary to his carotid occlusion.  At angiography he was found to have 70% right carotid stenosis.  The patient has a long-standing history of coronary artery disease.  He is status post CABG approximately 10 years ago.  He is also undergone percutaneous stenting secondary to multiple heart attacks.  He also has a history of peripheral vascular disease and has undergone iliac stenting.  He has a greater than 100-pack-year smoking history.  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  He denies any numbness or weakness in either extremity.  Denies slurred speech.   PAST MEDICAL HISTORY:   Past Medical History:  Diagnosis Date  . Acute encephalopathy, improved 06/20/2013  . Acute MI, troponin > 20, no obvious culprit vessel 06/20/2013  . Anxiety   . Arthritis   . At risk for sudden cardiac death, has lifevest at discharge 06/20/2013  . Carotid stenosis   . Coronary artery disease    s/p CABG 2006; stenting to vein graft 05-2013; NSTEMI 06-2013 in setting of PEA arrest  . Depression   . Difficulty swallowing solids    Pt unable to swallow solid foods  . Dizziness 06/20/2013  . Dyslipidemia 10/09/2013   [place] Study atorvastatin -eIRB # [id number] tablet Take 40 mg by mouth daily.   . Fever, possible aspiration-treated with antibiotics  06/20/2013  . GERD (gastroesophageal reflux disease)   . Heart failure, acute on chronic, systolic and diastolic (HCC) 12/26/2013  . Heart murmur   . History of radiation therapy 09/05/14-10/23/14   larynx 70 Gy  . Hyperlipidemia   . Hypertension   . Hypothermia, induced, post arrest 06/20/2013  . Incisional hernia   . Ischemic cardiomyopathy    echo 06/2013 EF 10-15% (normal 01/2013)  . PAD (peripheral artery disease) (HCC) 10/09/2013   Occluded left common carotid and moderate right internal carotid artery stenosis. Previous stent to the left subclavian artery. Right iliac stent. Bilateral 60-99% renal artery stenosis    . Radiation-induced esophageal stricture   . Shock liver 06/20/2013  . Shortness of breath dyspnea    with exertion  . Squamous cell carcinoma of larynx (HCC) 07/14/2014  . Subclavian artery stenosis, left (HCC)    s/p stenting 2006  . Tobacco abuse    >100 pack year history   . Weakness due to cardiac arrest 06/20/2013  . Wears glasses      FAMILY HISTORY:   Family History  Problem Relation Age of Onset  . Heart disease Maternal Aunt   . Heart attack Mother   . Stroke Mother   . Cancer Father   . Stroke Sister   . Heart Problems Sister     SOCIAL HISTORY:   Social History  Substance Use Topics  . Smoking status: Former Smoker    Packs/day: 4.00    Years: 45.00      Types: Cigarettes    Quit date: 06/09/2013  . Smokeless tobacco: Former User  . Alcohol use 12.6 oz/week    21 Cans of beer per week     ALLERGIES:   Allergies  Allergen Reactions  . Cetuximab Anaphylaxis    Cardiac arrest 08/02/2014     CURRENT MEDICATIONS:   Current Outpatient Prescriptions  Medication Sig Dispense Refill  . ALPRAZolam (XANAX) 0.5 MG tablet Place 1 tablet (0.5 mg total) into feeding tube 3 (three) times daily as needed for anxiety. (Patient taking differently: Take 0.5 mg by mouth 3 (three) times daily as needed for anxiety. Dissolve in water)  0  . aspirin (ASPIRIN  CHILDRENS) 81 MG chewable tablet Place 1 tablet (81 mg total) into feeding tube daily. (Patient taking differently: Chew 81 mg by mouth daily. Dissolve in water) 30 tablet 0  . buPROPion (WELLBUTRIN) 100 MG tablet Place 1.5 tablets (150 mg total) into feeding tube 2 (two) times daily. (Patient taking differently: Take 150 mg by mouth 2 (two) times daily. Dissolve in water) 90 tablet 0  . carvedilol (COREG) 6.25 MG tablet Take 1 tablet (6.25 mg total) by mouth 2 (two) times daily with a meal. Please schedule appointment for refills. 60 tablet 0  . RaNITidine HCl (ACID REDUCER PO) Take 1 tablet by mouth daily. For acid reflux; dissolved in water    . clopidogrel (PLAVIX) 75 MG tablet Take 1 tablet (75 mg total) by mouth daily. 30 tablet 6   No current facility-administered medications for this visit.     REVIEW OF SYSTEMS:   [X] denotes positive finding, [ ] denotes negative finding Cardiac  Comments:  Chest pain or chest pressure:    Shortness of breath upon exertion:    Short of breath when lying flat:    Irregular heart rhythm:        Vascular    Pain in calf, thigh, or hip brought on by ambulation:    Pain in feet at night that wakes you up from your sleep:     Blood clot in your veins:    Leg swelling:         Pulmonary    Oxygen at home:    Productive cough:     Wheezing:         Neurologic    Sudden weakness in arms or legs:     Sudden numbness in arms or legs:     Sudden onset of difficulty speaking or slurred speech:    Temporary loss of vision in one eye:     Problems with dizziness:         Gastrointestinal    Blood in stool:     Vomited blood:         Genitourinary    Burning when urinating:     Blood in urine:        Psychiatric    Major depression:         Hematologic    Bleeding problems:    Problems with blood clotting too easily:        Skin    Rashes or ulcers:        Constitutional    Fever or chills:      PHYSICAL EXAM:   Vitals:    06/23/16 1603 06/23/16 1606  BP: 120/77 125/81  Pulse: 79   Resp: 16   Temp: 97.6 F (36.4 C)   TempSrc: Oral   SpO2: 99%   Weight: 166 lb (  75.3 kg)   Height: 5' 7" (1.702 m)     GENERAL: The patient is a well-nourished male, in no acute distress. The vital signs are documented above. CARDIAC: There is a regular rate and rhythm.  PULMONARY: Non-labored respirations MUSCULOSKELETAL: There are no major deformities or cyanosis. NEUROLOGIC: No focal weakness or paresthesias are detected. SKIN: There are no ulcers or rashes noted. PSYCHIATRIC: The patient has a normal affect.  STUDIES:   I have reviewed his carotid duplex.  There is been progression of the right sided carotid stenosis.  He now is in the 80-99 percent category with known left carotid occlusion  MEDICAL ISSUES:   Asymptomatic right carotid stenosis, greater than 80% in a patient with a history of radiation for throat cancer.  I do not think the patient is a good candidate for endarterectomy because of his neck radiation.  He is best treated with stenting.  I have discussed the risks and benefits of the procedure with the patient including the risk of stroke and access site complications.  I am starting him on Plavix.  He will continue his aspirin.  His procedure will be scheduled in the month of June.    Wells Brabham, MD [facility]  Tel [phone] Pager [phone] 

## 2016-07-11 ENCOUNTER — Encounter (HOSPITAL_COMMUNITY): Payer: Self-pay

## 2016-07-11 LAB — BASIC METABOLIC PANEL
ANION GAP: 6 (ref 5–15)
BUN: 8 mg/dL (ref 6–20)
CALCIUM: 8.3 mg/dL — AB (ref 8.9–10.3)
CO2: 26 mmol/L (ref 22–32)
CREATININE: 0.92 mg/dL (ref 0.61–1.24)
Chloride: 104 mmol/L (ref 101–111)
GFR calc non Af Amer: >60 mL/min (ref >60)
Glucose, Bld: 135 mg/dL — ABNORMAL HIGH (ref 65–99)
Potassium: 3.8 mmol/L (ref 3.5–5.1)
SODIUM: 136 mmol/L (ref 135–145)

## 2016-07-11 LAB — CBC
HCT: 32.6 % — ABNORMAL LOW (ref 39.0–52.0)
HEMOGLOBIN: 10.4 g/dL — AB (ref 13.0–17.0)
MCH: 29.8 pg (ref 26.0–34.0)
MCHC: 31.9 g/dL (ref 30.0–36.0)
MCV: 93.4 fL (ref 78.0–100.0)
PLATELETS: 251 10*3/uL (ref 150–400)
RBC: 3.49 MIL/uL — AB (ref 4.22–5.81)
RDW: 16 % — ABNORMAL HIGH (ref 11.5–15.5)
WBC: 10.5 10*3/uL (ref 4.0–10.5)

## 2016-07-11 MED FILL — Norepinephrine Bitartrate IV Soln 1 MG/ML (Base Equivalent): INTRAVENOUS | Qty: 4 | Status: AC

## 2016-07-11 NOTE — Progress Notes (Signed)
    Subjective  - POD #1, s/p L carotid stent  C/o blurry vision Ambulated today Started on Dopamine for hypotension yesterday   Physical Exam:  No focal motor or sensory deficits abd soft CV:  bradycardic       Assessment/Plan:  POD #1  S/p right CAS:  Patient with bilateral blurry vision since procedure, however, he just let me know about it.  He does not describe classic amaurosis, however, I am going to get a MRI to further evaluate this  Hypotension:  Still requiring dopamine, con't to wean as tolerated  Dispo:  Will likely need to stay another day  Continue dual anti-platelet therapy  Has appointment for f/u in 1 month with carotid duplex  Janita Camberos, [place] 07/11/2016 7:39 AM --  Vitals:   07/11/16 0600 07/11/16 0727  BP: 127/60 (!) 104/50  Pulse: (!) 53 (!) 46  Resp: 17 16  Temp:  98.6 F (37 C)    Intake/Output Summary (Last 24 hours) at 07/11/16 0739 Last data filed at 07/11/16 0727  Gross per 24 hour  Intake         10015.52 ml  Output             1800 ml  Net          8215.52 ml     Laboratory CBC    Component Value Date/Time   WBC 10.5 07/11/2016 0326   HGB 10.4 (L) 07/11/2016 0326   HGB 11.6 (L) 11/06/2014 1249   HCT 32.6 (L) 07/11/2016 0326   HCT 36.0 (L) 11/06/2014 1249   PLT 251 07/11/2016 0326   PLT 266 11/06/2014 1249    BMET    Component Value Date/Time   NA 136 07/11/2016 0326   NA 139 11/06/2014 1249   K 3.8 07/11/2016 0326   K 4.2 11/06/2014 1249   CL 104 07/11/2016 0326   CO2 26 07/11/2016 0326   CO2 29 11/06/2014 1249   GLUCOSE 135 (H) 07/11/2016 0326   GLUCOSE 122 11/06/2014 1249   BUN 8 07/11/2016 0326   BUN 16.7 11/06/2014 1249   CREATININE 0.92 07/11/2016 0326   CREATININE 0.9 11/06/2014 1249   CALCIUM 8.3 (L) 07/11/2016 0326   CALCIUM 9.9 11/06/2014 1249   GFRNONAA >60 07/11/2016 0326   GFRAA >60 07/11/2016 0326    COAG Lab Results  Component Value Date   INR 1.00 10/17/2014   INR 0.94 07/28/2014   INR 0.99 06/22/2014   No results found for: PTT  Antibiotics Anti-infectives    None       V. Leia Alf, M.D. [facility] [place] Office: [phone] Pager:  [phone]

## 2016-07-11 NOTE — Plan of Care (Signed)
Problem: Education: Goal: Knowledge of [facility] Education information/materials will improve Outcome: Progressing Explained unit rountine vital signs, lab draws

## 2016-07-12 NOTE — Plan of Care (Signed)
Problem: Pain Managment: Goal: General experience of comfort will improve Outcome: Progressing Pt denies pain at this time   

## 2016-07-12 NOTE — Progress Notes (Addendum)
  Progress Note    07/12/2016 8:17 AM 2 Days Post-Op  Subjective:  Ready to get out of here.  My hands are puffy  afebrile HR 40's-60's SB 62'I-948'N systolic 46% RA  Gtts:  Dopamine 69mcg  Vitals:   07/12/16 0600 07/12/16 0751  BP: 113/65 124/64  Pulse: (!) 52   Resp: 10   Temp:  98.7 F (37.1 C)     Physical Exam: Neuro:  In tact; tongue is midline Lungs:  Non labored Incision:  Right groin is soft without hematoma.  CBC    Component Value Date/Time   WBC 10.5 07/11/2016 0326   RBC 3.49 (L) 07/11/2016 0326   HGB 10.4 (L) 07/11/2016 0326   HGB 11.6 (L) 11/06/2014 1249   HCT 32.6 (L) 07/11/2016 0326   HCT 36.0 (L) 11/06/2014 1249   PLT 251 07/11/2016 0326   PLT 266 11/06/2014 1249   MCV 93.4 07/11/2016 0326   MCV 90.6 11/06/2014 1249   MCH 29.8 07/11/2016 0326   MCHC 31.9 07/11/2016 0326   RDW 16.0 (H) 07/11/2016 0326   RDW 16.6 (H) 11/06/2014 1249   LYMPHSABS 0.8 (L) 11/06/2014 1249   MONOABS 0.7 11/06/2014 1249   EOSABS 0.1 11/06/2014 1249   BASOSABS 0.0 11/06/2014 1249    BMET    Component Value Date/Time   NA 136 07/11/2016 0326   NA 139 11/06/2014 1249   K 3.8 07/11/2016 0326   K 4.2 11/06/2014 1249   CL 104 07/11/2016 0326   CO2 26 07/11/2016 0326   CO2 29 11/06/2014 1249   GLUCOSE 135 (H) 07/11/2016 0326   GLUCOSE 122 11/06/2014 1249   BUN 8 07/11/2016 0326   BUN 16.7 11/06/2014 1249   CREATININE 0.92 07/11/2016 0326   CREATININE 0.9 11/06/2014 1249   CALCIUM 8.3 (L) 07/11/2016 0326   CALCIUM 9.9 11/06/2014 1249   GFRNONAA >60 07/11/2016 0326   GFRAA >60 07/11/2016 0326     Intake/Output Summary (Last 24 hours) at 07/12/16 0817 Last data filed at 07/12/16 0754  Gross per 24 hour  Intake          2236.99 ml  Output             1600 ml  Net           636.99 ml     Assessment/Plan:  This is a 61 y.o. male who is s/p left carotid stent 2 Days Post-Op  -pt is doing well this am, but still requiring Dopamine.  His systolic pressure  drops to the 80's when turning off Dopamine.  His blurry vision only happens when his pressure is low.  Spoke with Dr. Trula Slade and will hold on MRA given his sx are correlating with hypotension.  Continue to try to wean dopamine today. -d/c IVF to [facility] -pt neuro exam is in tact -pt has ambulated, but not today     Leontine Locket, PA-C [facility] [phone]  I have interviewed the patient and examined the patient. I agree with the findings by the PA. Almost off dopamine. Home tomorrow if able to get off of dopamine.  Gae Gallop, MD [phone]

## 2016-07-13 NOTE — Discharge Summary (Signed)
Discharge Summary     [name] 1955/11/12 61 y.o. male  [id number]  Admission Date: 07/10/2016  Discharge Date: 07/13/16  Physician: Serafina Mitchell, MD  Admission Diagnosis: Carotid stenosis [I65.29]   HPI:   This is a 61 y.o. male who I met in 2017 for clearance for colostomy takedown.  The patient has a history of a left carotid artery occlusion.  When it occluded he had amaurosis fugax, which did resolve.  His ultrasound showed 60-79% right carotid stenosis.  In order to clear him for his surgery, I felt angiography was indicated given the possibility of overestimation of disease secondary to his carotid occlusion.  At angiography he was found to have 70% right carotid stenosis.  The patient has a long-standing history of coronary artery disease.  He is status post CABG approximately 10 years ago.  He is also undergone percutaneous stenting secondary to multiple heart attacks.  He also has a history of peripheral vascular disease and has undergone iliac stenting.  He has a greater than 100-pack-year smoking history.  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  He denies any numbness or weakness in either extremity.  Denies slurred speech.  Hospital Course:  The patient was admitted to the hospital and taken to the [facility] lab on 07/10/2016 and underwent left carotid stenting.  The pt tolerated the procedure well and was transported to the PACU in good condition.   By POD 1, the pt neuro status was in tact.  He did have some blurry vision and an MRA of the brain was ordered stat.  This was not done.   He was on dopamine for blood pressure.  By POD 2, he remained on dopamine.  Pt states his vision only gets blurry when his blood pressure drops.  Since the MRA did not get done and his sx were related to hypotension, the MRA was cancelled.   He was weaned off the dopamine successfully.    By POD 3, he has not had any further  issues with his vision.  He is on a low dose of Coreg at home.  He is asked to not restart this until his systolic BP is > 342 and HR > 65 as his systolic BP on admission was 120.    The remainder of the hospital course consisted of increasing mobilization and increasing intake of solids without difficulty.    Recent Labs  07/11/16 0326  NA 136  K 3.8  CL 104  CO2 26  GLUCOSE 135*  BUN 8  CALCIUM 8.3*    Recent Labs  07/11/16 0326  WBC 10.5  HGB 10.4*  HCT 32.6*  PLT 251   No results for input(s): INR in the last 72 hours.   Discharge Instructions    CAROTID [place]: Call MD for difficulty swallowing or speaking; weakness in arms or legs that is a new symtom; severe headache.  If you have increased swelling in the neck and/or  are having difficulty breathing, CALL 911    Complete by:  As directed    Call MD for:  redness, tenderness, or signs of infection (pain, swelling, bleeding, redness, odor or green/yellow discharge around incision site)    Complete by:  As directed    Call MD for:  severe or increased pain, loss or decreased feeling  in affected limb(s)    Complete by:  As directed    Call MD for:  temperature >100.5  Complete by:  As directed    Discharge instructions    Complete by:  As directed    Resume Coreg when the top number on your blood pressure is over 120 and HR over 65.  Keep a record of your blood pressure morning and evening.  If you have any more visual symptoms, please contact our office.   Discharge wound care:    Complete by:  As directed    Shower daily with soap and water starting 07/13/16   Driving Restrictions    Complete by:  As directed    No driving for 57-26 hours   Lifting restrictions    Complete by:  As directed    No heavy lifting for 2 weeks   Resume previous diet    Complete by:  As directed       Discharge Diagnosis:  Carotid stenosis [I65.29]  Secondary Diagnosis: Patient Active Problem List   Diagnosis Date Noted  .  Carotid stenosis 07/10/2016  . CAD S/P percutaneous coronary angioplasty 07/07/2016  . S/P colostomy takedown 06/13/2015  . Aspiration into respiratory tract   . Protein-calorie malnutrition, severe ([place]) 10/13/2014  . Palliative care encounter 10/13/2014  . Pre-operative cardiovascular examination 10/13/2014  . Pain in throat 10/13/2014  . Increased oropharyngeal secretions   . Laryngeal cancer ([place])   . Skin ulceration ([place]) 10/11/2014  . Fever in adult 10/11/2014  . Dysphagia 10/11/2014  . Infection, respiratory tract 10/10/2014  . Skin sore 10/10/2014  . Protein calorie malnutrition ([place]) 09/21/2014  . Hypotension due to drugs 09/13/2014  . Throat pain in adult 09/06/2014  . Other emphysema ([place])   . Cardiac ischemia   . Perforated bowel ([place])   . Alcohol abuse   . Peritonitis ([place])   . Hypokalemia   . Hypomagnesemia   . Troponin level elevated   . History of small bowel obstruction 08/05/2014  . NSTEMI (non-ST elevated myocardial infarction) ([place]) 08/05/2014  . Drug reaction   . Hx of CABG x 1 '06   . Sore throat 08/01/2014  . Cancer associated pain 08/01/2014  . Squamous cell carcinoma of larynx ([place]) 07/14/2014  . Chronic combined systolic and diastolic heart failure ([place]) 12/26/2013  . Dyslipidemia 10/09/2013  . PAD (peripheral artery disease) ([place]) 10/09/2013  . At risk for sudden cardiac death, has lifevest at discharge 06/20/2013  . Ischemic cardiomyopathy, EF 45% 06/20/2013  . Shock liver 06/20/2013  . Acute MI, troponin > 20, no obvious culprit vessel, NSTEMI anterior wall 06/20/2013  . Hypothermia, induced, post arrest, initially then stopped 06/20/2013  . Acute encephalopathy, improved 06/20/2013  . Dizziness 06/20/2013  . Weakness due to cardiac arrest 06/20/2013  . Fever, possible aspiration-treated with antibiotics 06/20/2013  . Anemia in chronic illness 06/17/2013  . NSVT (nonsustained ventricular tachycardia) ([place]) 06/17/2013  . CAD-hx of CABG '06,  DES LCX 01/2013, DES SVG-LAD 5/21/5 at [facility] 06/17/2013  . Acute respiratory failure with hypoxia, intubated on admit, self-extubatedn 06/13/13 06/10/2013  . Cardiogenic shock ([place]) 06/10/2013  . Cardiogenic pulmonary edema, improved 06/10/2013  . History of cardiac arrest-2015 06/09/2013   Past Medical History:  Diagnosis Date  . Acute encephalopathy, improved 06/20/2013  . Acute MI, troponin > 20, no obvious culprit vessel 06/20/2013  . Anxiety   . Arthritis   . At risk for sudden cardiac death, has lifevest at discharge 06/20/2013  . Carotid stenosis   . Coronary artery disease    s/p CABG 2006; stenting to vein graft 05-2013; NSTEMI 06-2013  in setting of PEA arrest  . Depression   . Difficulty swallowing solids    Pt unable to swallow solid foods  . Dizziness 06/20/2013  . Dyslipidemia 10/09/2013   [place] Study atorvastatin -eIRB # [id number] tablet Take 40 mg by mouth daily.   . Fever, possible aspiration-treated with antibiotics 06/20/2013  . GERD (gastroesophageal reflux disease)   . Heart failure, acute on chronic, systolic and diastolic ([place]) 03/00/9233  . Heart murmur   . History of radiation therapy 09/05/14-10/23/14   larynx 70 Gy  . Hyperlipidemia   . Hypertension   . Hypothermia, induced, post arrest 06/20/2013  . Incisional hernia   . Ischemic cardiomyopathy    echo 06/2013 EF 10-15% (normal 01/2013)  . PAD (peripheral artery disease) ([place]) 10/09/2013   Occluded left common carotid and moderate right internal carotid artery stenosis. Previous stent to the left subclavian artery. Right iliac stent. Bilateral 60-99% renal artery stenosis    . Radiation-induced esophageal stricture   . Shock liver 06/20/2013  . Shortness of breath dyspnea    with exertion  . Squamous cell carcinoma of larynx ([place]) 07/14/2014  . Subclavian artery stenosis, left (HCC)    s/p stenting 2006  . Tobacco abuse    >100 pack year history   . Weakness due to cardiac arrest 06/20/2013  . Wears glasses     Allergies as  of 07/13/2016      Reactions   Cetuximab Anaphylaxis   Cardiac arrest 08/02/2014      Medication List    TAKE these medications   ALPRAZolam 0.5 MG tablet Commonly known as:  XANAX Take 0.5 mg by mouth 3 (three) times daily.   aspirin EC 81 MG tablet Take 81 mg by mouth daily.   atorvastatin 80 MG tablet Commonly known as:  LIPITOR Take 1 tablet (80 mg total) by mouth daily.   buPROPion 100 MG tablet Commonly known as:  WELLBUTRIN Take 100 mg by mouth 3 (three) times daily.   carvedilol 6.25 MG tablet Commonly known as:  COREG TAKE 1 TABLET(6.25 MG) BY MOUTH TWICE DAILY WITH A MEAL   clopidogrel 75 MG tablet Commonly known as:  PLAVIX Take 1 tablet (75 mg total) by mouth daily.   famotidine 20 MG tablet Commonly known as:  PEPCID Take 20 mg by mouth daily.       Prescriptions given: none  Instructions: 1.  No driving for 00-76 hours 2.  No heavy lifting x 2 weeks 3.  Shower daily with soap and water starting 07/13/16 4.  Call our office with any more visual changes.  Disposition: home  Patient's condition: is Good  Follow up: 1. Dr. Trula Slade in 4 weeks with a carotid duplex.   Leontine Locket, PA-C [facility] [phone]   --- For [facility] use ---   Modified Rankin score at D/C (0-6): 0  IV medication needed for:  1. Hypertension: No 2. Hypotension: Yes  Post-op Complications: No  1. Post-op CVA or TIA: No  If yes: Event classification (right eye, left eye, right cortical, left cortical, verterobasilar, other): n/a  If yes: Timing of event (intra-op, <6 hrs post-op, >=6 hrs post-op, unknown): n/a  2. CN injury: No  If yes: CN n/a injuried   3. Myocardial infarction: No  If yes: Dx by (EKG or clinical, Troponin): n/a  4.  CHF: No  5.  Dysrhythmia (new): No  6. Wound infection: No  7. Reperfusion symptoms: No  8. Return to OR:  No  If yes: return to OR for (bleeding, neurologic, other CEA incision, other):  n/a  Discharge medications: Statin use:  Yes   If No:   ASA use:  Yes  If No:   Beta blocker use:  Yes ACE-Inhibitor use:  No  ARB use:  No CCB use: No P2Y12 Antagonist use: Yes, [ x] Plavix, [ ]  Plasugrel, [ ]  Ticlopinine, [ ]  Ticagrelor, [ ]  Other, [ ]  No for medical reason, [ ]  Non-compliant, [ ]  Not-indicated Anti-coagulant use:  No, [ ]  Warfarin, [ ]  Rivaroxaban, [ ]  Dabigatran,

## 2016-07-13 NOTE — Progress Notes (Signed)
Patient discharged to home with VSS and all belongings. Discharge instructions reviewed with patient and he has verbalized understanding.

## 2016-07-13 NOTE — Care Management Note (Signed)
Case Management Note  Patient Details  Name: [name] MRN: [id number] Date of Birth: 13-Jun-1955  Subjective/Objective:                 Patient with order to DC to home. Chart reviewed, uninsured, PCP [facility], KIMBERLY , no changes to home meds noted on AVS. No CM needs identified.    Action/Plan:  DC to home Expected Discharge Date:  07/13/16               Expected Discharge Plan:  Home/Self Care  In-House Referral:     Discharge planning Services  CM Consult  Post Acute Care Choice:    Choice offered to:     DME Arranged:    DME Agency:     HH Arranged:    HH Agency:     Status of Service:  Completed, signed off  If discussed at [employer] of Stay Meetings, dates discussed:    Additional Comments:  Carles Collet, RN 07/13/2016, 8:21 AM

## 2016-07-13 NOTE — Progress Notes (Signed)
  Progress Note    07/13/2016 7:29 AM 3 Days Post-Op  Subjective:  No complaints; no further issues with difficulty in vision; blood pressure is good even after walking.  Afebrile HR  49-60's SB/NSR  18'H-631'S systolic 97% RA  Gtts: off dopamine last evening    Vitals:   07/13/16 0600 07/13/16 0615  BP: 130/68 (!) 121/58  Pulse: (!) 52 68  Resp: 19 (!) 21  Temp:      Physical Exam: Cardiac:  regular Lungs:  Non labored   CBC    Component Value Date/Time   WBC 10.5 07/11/2016 0326   RBC 3.49 (L) 07/11/2016 0326   HGB 10.4 (L) 07/11/2016 0326   HGB 11.6 (L) 11/06/2014 1249   HCT 32.6 (L) 07/11/2016 0326   HCT 36.0 (L) 11/06/2014 1249   PLT 251 07/11/2016 0326   PLT 266 11/06/2014 1249   MCV 93.4 07/11/2016 0326   MCV 90.6 11/06/2014 1249   MCH 29.8 07/11/2016 0326   MCHC 31.9 07/11/2016 0326   RDW 16.0 (H) 07/11/2016 0326   RDW 16.6 (H) 11/06/2014 1249   LYMPHSABS 0.8 (L) 11/06/2014 1249   MONOABS 0.7 11/06/2014 1249   EOSABS 0.1 11/06/2014 1249   BASOSABS 0.0 11/06/2014 1249    BMET    Component Value Date/Time   NA 136 07/11/2016 0326   NA 139 11/06/2014 1249   K 3.8 07/11/2016 0326   K 4.2 11/06/2014 1249   CL 104 07/11/2016 0326   CO2 26 07/11/2016 0326   CO2 29 11/06/2014 1249   GLUCOSE 135 (H) 07/11/2016 0326   GLUCOSE 122 11/06/2014 1249   BUN 8 07/11/2016 0326   BUN 16.7 11/06/2014 1249   CREATININE 0.92 07/11/2016 0326   CREATININE 0.9 11/06/2014 1249   CALCIUM 8.3 (L) 07/11/2016 0326   CALCIUM 9.9 11/06/2014 1249   GFRNONAA >60 07/11/2016 0326   GFRAA >60 07/11/2016 0326    INR    Component Value Date/Time   INR 1.00 10/17/2014 0515     Intake/Output Summary (Last 24 hours) at 07/13/16 0729 Last data filed at 07/13/16 0500  Gross per 24 hour  Intake          1903.78 ml  Output             2075 ml  Net          -171.22 ml     Assessment:  61 y.o. male is s/p:  Left carotid stent  3 Days Post-Op  Plan: -pt is off  dopamine and doing well.  Blood pressure is good without any further issues with blurry vision. -pt has ambulated without difficulty. -d/c home today & f/u with Dr. Trula Slade in 4 weeks with carotid duplex.   Leontine Locket, PA-C [facility] [phone] 07/13/2016 7:29 AM

## 2016-07-15 ENCOUNTER — Telehealth: Payer: Self-pay | Admitting: Surgery

## 2016-07-15 NOTE — Telephone Encounter (Signed)
-----   Message from Mena Goes, RN sent at 07/10/2016 10:35 AM EDT ----- Regarding: 1 month postop w/ duplex   ----- Message ----- From: Serafina Mitchell, MD Sent: 07/10/2016  10:24 AM To: [facility] Charge Pool  07/02/2016:  Surgeon:  Milderd Meager Procedure Performed:  1.  U/s guided access right femoral artery  2.  Aortic arch angiogram  3.  Right carotid stenting with distal embolic protection   Dr. Gwenlyn Found will Bil for the stenting portion.  I will be billing for diagnostic and access.  The patient is to follow up in one month with a carotid duplex.

## 2016-07-15 NOTE — Telephone Encounter (Signed)
Sched appt 08/18/16; lab at 11:00 and MD 12:15. Spoke to pt.

## 2016-07-28 NOTE — Consult Note (Signed)
NAME:  KYRIN, GRATZ                  ACCOUNT NO.:  MEDICAL RECORD NO.:  [id number]  LOCATION:                                 FACILITY:  PHYSICIAN:  Kattleya Kuhnert K. Jabarie Pop, M.D.DATE OF BIRTH:  06-06-1955  DATE OF CONSULTATION: DATE OF DISCHARGE:                                CONSULTATION   CLINICAL HISTORY:  Patient with bilateral  Common carotid artery stenosis/occlusion.  EXAMINATION:  Intracranial interpretation of the right common carotid arteriogram pre and post-stenting.  The pre-stenting common carotid arteriogram demonstrates the right internal carotid artery in the distal cervical and petrous segments to be widely patent.  The petrous, the cavernous and supraclinoid segments demonstrates wide patency.  The right middle cerebral artery opacifies into the capillary and the venous phases.  The right anterior cerebral artery at its origin has a high-grade focal stenosis with poststenotic dilatation of the right A1 segment. Opacification is noted into the capillary and venous phases.  Also demonstrated is prompt opacification via the anterior communicating artery of the left anterior cerebral artery distribution, and also flash opacification of the left middle cerebral artery distribution.  The postangioplasty and stenting right  common carotid arteriogram demonstrates brisk flow into the anterior circulation.  There is brisk opacification via the anterior communicating artery of the left anterior and left middle cerebral artery distribution.  There continues to be severe high-grade stenosis of the origin of the right anterior cerebral artery A1 segment.  Impression   Poststenting of the right common carotid artery proximally demonstrates significantly improved hemodynamic flow of contrast into the right anterior and also the left anterior cerebral hemispheres.  Severe high-grade stenosis of the right anterior cerebral artery A1 segment proximally.  No gross  angiographic evidence of occlusions or intraluminal filling defect seen.          ______________________________ Fritz Pickerel Estanislado Pandy, M.D.     SKD/MEDQ  D:  07/26/2016  T:  07/27/2016  Job:  [id number]

## 2016-08-04 ENCOUNTER — Encounter: Payer: Self-pay | Admitting: Surgery

## 2016-08-08 NOTE — Addendum Note (Signed)
Addended by: Lianne Cure A on: 08/08/2016 03:36 PM   Modules accepted: Orders

## 2016-08-18 ENCOUNTER — Ambulatory Visit (HOSPITAL_COMMUNITY)
Admit: 2016-08-18 | Discharge: 2016-08-18 | Disposition: A | Discharge status: Home / Self Care | Payer: Self-pay | Attending: Surgery | Admitting: Surgery

## 2016-08-18 ENCOUNTER — Ambulatory Visit (INDEPENDENT_AMBULATORY_CARE_PROVIDER_SITE_OTHER): Payer: Self-pay | Admitting: Surgery

## 2016-08-18 VITALS — BP 141/90 | HR 73 | Temp 96.9°F | Resp 16 | Ht 67.0 in | Wt 155.0 lb

## 2016-08-18 DIAGNOSIS — I6521 Occlusion and stenosis of right carotid artery: Secondary | ICD-10-CM | POA: Insufficient documentation

## 2016-08-18 DIAGNOSIS — Z9889 Other specified postprocedural states: Secondary | ICD-10-CM | POA: Insufficient documentation

## 2016-08-18 LAB — VAS US CAROTID
RCCADSYS: -89 cm/s
RCCAPDIAS: 27 cm/s
RIGHT CCA MID DIAS: -25 cm/s
RIGHT ECA DIAS: -42 cm/s
Right CCA prox sys: 86 cm/s

## 2016-08-18 NOTE — Progress Notes (Signed)
Patient name: [name] MRN: [id number] DOB: 1955-01-28 Sex: male  REASON FOR VISIT:     post op  HISTORY OF PRESENT ILLNESS:   [name] is a 61 y.o. male who is status post right carotid stenting for an asymptomatic lesion on 07/10/2016.  A 90% stenosis was found at angiography..  He has a history of throat cancer and radiation therefore he was not a good candidate for endarterectomy.  He has a known left carotid occlusion  He denies any symptoms since his procedure.  He does not have any numbness or weakness.  His groin site does not bother him.  The patient has a long-standing history of coronary artery disease.  He is status post CABG approximately 10 years ago.  He is also undergone percutaneous stenting secondary to multiple heart attacks.  He also has a history of peripheral vascular disease and has undergone iliac stenting.  He has a greater than 100-pack-year smoking history.  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  CURRENT MEDICATIONS:    Current Outpatient Prescriptions  Medication Sig Dispense Refill  . ALPRAZolam (XANAX) 0.5 MG tablet Take 0.5 mg by mouth 3 (three) times daily.     Marland Kitchen aspirin EC 81 MG tablet Take 81 mg by mouth daily.    Marland Kitchen atorvastatin (LIPITOR) 80 MG tablet Take 1 tablet (80 mg total) by mouth daily. 90 tablet 3  . buPROPion (WELLBUTRIN) 100 MG tablet Take 100 mg by mouth 3 (three) times daily.     . carvedilol (COREG) 6.25 MG tablet TAKE 1 TABLET(6.25 MG) BY MOUTH TWICE DAILY WITH A MEAL 60 tablet 0  . clopidogrel (PLAVIX) 75 MG tablet Take 1 tablet (75 mg total) by mouth daily. 30 tablet 6  . famotidine (PEPCID) 20 MG tablet Take 20 mg by mouth daily.     No current facility-administered medications for this visit.     REVIEW OF SYSTEMS:   [X]  denotes positive finding, [ ]  denotes negative finding Cardiac  Comments:  Chest pain or chest  pressure:    Shortness of breath upon exertion:    Short of breath when lying flat:    Irregular heart rhythm:    Constitutional    Fever or chills:      PHYSICAL EXAM:   Vitals:   08/18/16 1141 08/18/16 1143 08/18/16 1145  BP: 139/83 (!) 144/91 (!) 141/90  Pulse: 73 73 73  Resp: 16    Temp: (!) 96.9 F (36.1 C)    SpO2: 99%    Weight: 155 lb (70.3 kg)    Height: 5\' 7"  (1.702 m)      GENERAL: The patient is a well-nourished male, in no acute distress. The vital signs are documented above. CARDIOVASCULAR: There is a regular rate and rhythm. PULMONARY: Non-labored respirations   STUDIES:   Carotid duplex shows widely patent right carotid stent   MEDICAL ISSUES:   Status post right carotid stenting for asymptomatic stenosis in the setting of a known left carotid occlusion and a history of head and neck cancer, status post radiation.  The patient has not had any neurologic symptoms since his procedure.  He does complain of easy bleeding and bruising with his medication.  He is now greater than one month out from his procedure.  I have agreed to stop his Plavix.  I'm telling him to take a full 325 mg aspirin daily.  He will follow-up with me in  6 months with repeat carotid duplex.  Annamarie Major, MD [facility] [phone] Pager [phone]

## 2016-09-03 NOTE — Addendum Note (Signed)
Addended by: Lianne Cure A on: 09/03/2016 02:55 PM   Modules accepted: Orders

## 2016-10-23 ENCOUNTER — Ambulatory Visit
Admission: RE | Admit: 2016-10-23 | Discharge: 2016-10-23 | Disposition: A | Discharge status: Home / Self Care | Payer: Self-pay | Source: Ambulatory Visit | Attending: Radiation Oncology | Admitting: Radiation Oncology

## 2016-10-23 ENCOUNTER — Encounter: Payer: Self-pay | Admitting: Radiation Oncology

## 2016-10-23 VITALS — BP 146/83 | HR 72 | Temp 98.4°F | Ht 67.0 in | Wt 155.6 lb

## 2016-10-23 DIAGNOSIS — Z923 Personal history of irradiation: Secondary | ICD-10-CM | POA: Insufficient documentation

## 2016-10-23 DIAGNOSIS — C329 Malignant neoplasm of larynx, unspecified: Secondary | ICD-10-CM | POA: Insufficient documentation

## 2016-10-23 DIAGNOSIS — Z79899 Other long term (current) drug therapy: Secondary | ICD-10-CM | POA: Insufficient documentation

## 2016-10-23 DIAGNOSIS — Z888 Allergy status to other drugs, medicaments and biological substances status: Secondary | ICD-10-CM | POA: Insufficient documentation

## 2016-10-23 DIAGNOSIS — Z7982 Long term (current) use of aspirin: Secondary | ICD-10-CM | POA: Insufficient documentation

## 2016-10-23 NOTE — Progress Notes (Signed)
Radiation Oncology         [phone] ________________________________  Name: [name] MRN: [id number]  Date: 10/23/2016  DOB: 1955-08-30  Follow-Up Visit Note  CC: Edward Beals, NP  Edward Gala, MD    ICD-10-CM   1. Laryngeal cancer [name]) C32.9     Diagnosis:   Squamous cell carcinoma of larynx ([place])   Staging form: Larynx - Supraglottis, AJCC 7th Edition     Clinical stage from 07/20/2014: Stage III (T3, N0, M0) -   Interval Since Last Radiation: 2 years. Completed 09/05/2014 - 10/23/2014 to the larynx with 70 Gy at 2 Gy for 35 fractions with at risk lymph nodes receiving 63 Gy at 1.8 Gy per fraction and 56 Gy at 1.6 Gy per fraction. The patient was treated by Dr. Pablo [name].  Narrative:  The patient returns today for routine follow-up after treatment to his larynx. He presents with his wife. He states he was seen by Dr. Constance [name] since his last visit and is to follow up in two months in December. He had NED at that time. Pt states he had a swallowing test done at that time with no issues to report. He states his appetite is good and he has been eating without difficulty. He states that the radiation has made his teeth brittle and reports two left maxillary teeth have fractured but do not cause him any pain.  He denies any neck or ear pain. He denies cough, vomiting or difficulty swallowing.                  ALLERGIES:  is allergic to cetuximab.  Meds: Current Outpatient Prescriptions  Medication Sig Dispense Refill  . ALPRAZolam (XANAX) 0.5 MG tablet Take 0.5 mg by mouth 3 (three) times daily.     Marland Kitchen aspirin 325 MG tablet Take 325 mg by mouth daily.    Marland Kitchen atorvastatin (LIPITOR) 40 MG tablet Take 40 mg by mouth.    Marland Kitchen buPROPion (WELLBUTRIN) 100 MG tablet Take 100 mg by mouth 3 (three) times daily.     . carvedilol (COREG) 6.25 MG tablet TAKE 1 TABLET(6.25 MG) BY MOUTH TWICE DAILY WITH A MEAL 60 tablet 0  . famotidine (PEPCID) 20 MG tablet Take 20 mg by mouth daily.    Marland Kitchen  atorvastatin (LIPITOR) 80 MG tablet Take 1 tablet (80 mg total) by mouth daily. 90 tablet 3  . ranitidine (ZANTAC) 75 MG tablet Take 25 mg by mouth.     No current facility-administered medications for this encounter.     Physical Findings: The patient is in no acute distress. Patient is alert and oriented.  height is 5\' 7"  (1.702 m) and weight is 155 lb 9.6 oz (70.6 kg). His oral temperature is 98.4 F (36.9 C). His blood pressure is 146/83 (abnormal) and his pulse is 72. His oxygen saturation is 100%. .  No significant changes.  Lungs are clear to auscultation bilaterally. Heart has regular rate and rhythm. No palpable cervical, supraclavicular, or axillary adenopathy. Abdomen soft, non-tender, normal bowel sounds.  No palpable adenopathy in the neck or axillary region. Patient has some radiation changes in the anterior neck and some edema in the interior neck region. Oral cavity reveals mucosal to be dry no mucosal lesions. Patient has poor dentition with several teeth missing along the maxilla. Patient is noted to be hoarse, according to patient and family, this has been stable.  Indirect Laryngoscopy: Patient to proceeded to undergo indirect mirror examination through oral cavity  with no obvious tumor. Vocal cords moved well.   Palpation on base of the  tongue regions reveals no palpable signs of recurrence.   Lab Findings: Lab Results  Component Value Date   WBC 10.5 07/11/2016   HGB 10.4 (L) 07/11/2016   HCT 32.6 (L) 07/11/2016   MCV 93.4 07/11/2016   PLT 251 07/11/2016    Radiographic Findings: No results found.  Impression: No evidence of recurrence on clinical exam today. Persistent hoarseness but no change.  Plan: Patient will follow up with Dr. Constance [name] in December. Patient will follow up with radiation oncology in February 2019.  ____________________________________ -----------------------------------  Edward Promise, PhD, MD    This document serves as a record  of services personally performed by Edward Pray, MD. It was created on her behalf by [name], a trained medical scribe. The creation of this record is based on the scribe's personal observations and the provider's statements to them. This document has been checked and approved by the attending provider.

## 2016-10-23 NOTE — Progress Notes (Signed)
Khylen is here for follow up after treatment for to his larynx.  He denies having pain, trouble swallowing or a dry mouth.  He reports having a good appetite.  He also said he has fatigue.  He continues to have a hoarse voice.  He will see Dr. [name] on 12/19/16.  BP (!) 146/83 (BP Location: Right Arm, Patient Position: Sitting)   Pulse 72   Temp 98.4 F (36.9 C) (Oral)   Ht 5\' 7"  (1.702 m)   Wt 155 lb 9.6 oz (70.6 kg)   SpO2 100%   BMI 24.37 kg/m    Wt Readings from Last 3 Encounters:  10/23/16 155 lb 9.6 oz (70.6 kg)  08/18/16 155 lb (70.3 kg)  07/10/16 159 lb 1.6 oz (72.2 kg)

## 2017-02-05 ENCOUNTER — Ambulatory Visit (INDEPENDENT_AMBULATORY_CARE_PROVIDER_SITE_OTHER): Payer: Self-pay | Admitting: Cardiovascular Disease

## 2017-02-05 ENCOUNTER — Encounter: Payer: Self-pay | Admitting: Cardiovascular Disease

## 2017-02-05 VITALS — BP 130/74 | HR 78 | Ht 67.0 in | Wt 161.8 lb

## 2017-02-05 DIAGNOSIS — I6522 Occlusion and stenosis of left carotid artery: Secondary | ICD-10-CM | POA: Insufficient documentation

## 2017-02-05 DIAGNOSIS — I25810 Atherosclerosis of coronary artery bypass graft(s) without angina pectoris: Secondary | ICD-10-CM

## 2017-02-05 DIAGNOSIS — I5042 Chronic combined systolic (congestive) and diastolic (congestive) heart failure: Secondary | ICD-10-CM

## 2017-02-05 DIAGNOSIS — I739 Peripheral vascular disease, unspecified: Secondary | ICD-10-CM

## 2017-02-05 DIAGNOSIS — E785 Hyperlipidemia, unspecified: Secondary | ICD-10-CM

## 2017-02-05 MED ORDER — ASPIRIN 81 MG PO TBEC: 81.0000 mg | DELAYED_RELEASE_TABLET | Freq: Every day | ORAL | 12 refills | Status: DC

## 2017-02-05 NOTE — Patient Instructions (Signed)
Medication Instructions: Dr Croitoru has recommended making the following medication changes: 1. DECREASE Aspirin to 81 mg daily  Labwork: Your physician recommends that you return for lab work at your convenience - FASTING.  Testing/Procedures: NONE ORDERED  Follow-up: Dr Croitoru recommends that you schedule a follow-up appointment in 12 months. You will receive a reminder letter in the mail two months in advance. If you don't receive a letter, please call our office to schedule the follow-up appointment.  If you need a refill on your cardiac medications before your next appointment, please call your pharmacy. 

## 2017-02-05 NOTE — Progress Notes (Signed)
Patient ID: [name], male   DOB: 1955-07-06, 62 y.o.   MRN: [id number]    Cardiology Office Note    Date:  02/05/2017   ID:  BARRE AYDELOTT, DOB 17-Jan-1955, MRN [id number]  PCP:  [name], NP  Cardiologist:   Sanda Klein, MD   Chief Complaint  Patient presents with  . 6 month f/u visit    pt states no Sx.    History of Present Illness:  [name] is a 63 y.o. 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 He also has peripheral arterial disease involving the carotid system, subclavian arteries and the lower extremities.  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 During treatment he lost down to 140 pounds but has regained some of that weight and now has BMI of 25. He is able to put in a full day's work without dyspnea. He is sometimes limited by claudication but only a few blocks are very long distances without pause. He can climb 2 flights of stairs without stopping urine he has not had any angina pectoris.  He is on a full 325 mg dose of aspirin and often has GI upset when he takes it. He bleeds freely if he cuts himself. He has not had any focal neurological problems.  His lipid profile last summer showed his LDL had increased to 141 and he was restarted on statin therapy. He has not had a repeat lipid profile since then.  Additional problems include treated hypertension, hyperlipidemia and peripheral artery disease status post stent of the left subclavian artery in 2006, occluded left common carotid artery, stent of the right  internal carotid artery, history of right iliac artery stent, bilateral moderate to severe renal artery stenosis. Used to be a heavy smoker with more than 100-pack-year history and has had problems with prolonged intubation following surgery. Has a colostomy following diverticulitis and colonic perforation with plan for takedown with Dr. Philipp Ovens April 2017 and in Oct 2016 completed chemo and XRT for squamous cell carcinoma of the larynx. For a while he had a gastrostomy tube due to inability to swallow.  Last echo 08/07/2014  - Left ventricle: The cavity size was normal. Wall thickness was  normal. Systolic function was normal. The estimated ejection  fraction was in the range of 50% to 55%. There is hypokinesis of  the mid-apicalanteroseptal myocardium  Last nuclear study 07/04/2014 - low risk, no ischemia. Not gated.  Cath history  - CAD S/P CABG in '06 (SVG-LAD).  - January 2015 acute MI, proximal circumflex artery drug-eluting 2.5x16 mm Xience stent at [facility]  - May 2015 he went to his PCP for shoulder pain and his Troponin was elevated. He had stenosis in the SVG-LAD and a DES placed on 06/02/13 at [facility]. He was discharged on Plavix.  - 06/09/13 he had VF arrest, shock, respiratory failure, shock liver, and acute renal insufficiency. Cath revealed widely patent saphenous vein graft to the mid LAD (stented with no evidence of thrombus or restenosis). Widely patent proximal to mid circumflex stent, with haziness in the circumflex proximal to the stent but no significant obstruction.  Mild to moderate distal left main stenosis (30-40%) with 80-90% ostial LAD, threatening a small to moderate diagonal territory in the distribution of the wall motion abnormality. The mid LAD off the retrograde segment from the graft insertion site was occluded. Widely patent RCA. A Myoview showed no ischemia. Echo showed left ventricular ejection fraction of 10-15%. His P2Y12 was elevated and he was taken off  Plavix and put on Brilinita. Discharged 06/20/13 with a Life Vest.  - 09/27/13 ECHO showed remarkable improvement with LVEF of 45-50%. Wall motion abnormalities were seen in the proximal LAD artery territory as well as the apical septum. EF 50-55% July 2016. No ischemia on nuke June 2016.  He also has extensive peripheral arterial disease. Occluded left common carotid and 07/02/2016 stent to the right internal carotid artery. Previous stent in the left subclavian artery. Right iliac stent. Bilateral 60-99% renal artery stenosis    Past Medical History:  Diagnosis Date  . Acute encephalopathy, improved 06/20/2013  . Acute MI, troponin > 20, no obvious culprit vessel 06/20/2013  . Anxiety   . Arthritis   . At risk for sudden cardiac death, has lifevest at discharge 06/20/2013  . Carotid stenosis   . Coronary artery disease    s/p CABG 2006; stenting to vein graft 05-2013; NSTEMI 06-2013 in setting of PEA arrest  . Depression   . Difficulty swallowing solids    Pt unable to swallow solid foods  . Dizziness 06/20/2013  . Dyslipidemia 10/09/2013   [place] Study atorvastatin -eIRB # [id number] tablet Take 40 mg by mouth daily.   . Fever, possible aspiration-treated with antibiotics 06/20/2013  . GERD (gastroesophageal reflux disease)   . Heart failure, acute on chronic, systolic and diastolic ([place]) 51/88/4166  . Heart murmur   . History of radiation therapy 09/05/14-10/23/14   larynx 70 Gy  . Hyperlipidemia   . Hypertension   . Hypothermia, induced, post arrest 06/20/2013  . Incisional hernia   . Ischemic cardiomyopathy    echo 06/2013 EF 10-15% (normal 01/2013)  . PAD (peripheral artery disease) ([place]) 10/09/2013   Occluded left common carotid and moderate right internal carotid artery stenosis. Previous stent to the left subclavian artery. Right iliac stent. Bilateral 60-99% renal artery stenosis    . Radiation-induced esophageal stricture   . Shock liver 06/20/2013  . Shortness of breath dyspnea    with exertion   . Squamous cell carcinoma of larynx ([place]) 07/14/2014  . Subclavian artery stenosis, left (HCC)    s/p stenting 2006  . Tobacco abuse    >100 pack year history   . Weakness due to cardiac arrest 06/20/2013  . Wears glasses     Past Surgical History:  Procedure Laterality Date  . CARDIAC CATHETERIZATION    . CAROTID ANGIOGRAPHY  07/10/2016   Procedure: Carotid Angiography;  Surgeon: Lorretta Harp, MD;  Location: [place] [facility];  Service: Cardiovascular;;  Rt. Carotid  . CAROTID PTA/STENT INTERVENTION N/A 07/10/2016   Procedure: Carotid PTA/Stent Intervention;  Surgeon: Lorretta Harp, MD;  Location: [place] [facility];  Service: Cardiovascular;  Laterality: N/A;  Rt. External Carotid  . COLON SURGERY    . COLONOSCOPY    . COLOSTOMY    . COLOSTOMY TAKEDOWN  06/13/2015  . COLOSTOMY TAKEDOWN N/A 06/13/2015   Procedure: COLOSTOMY TAKEDOWN;  Surgeon: Coralie Keens, MD;  Location: [place];  Service: General;  Laterality: N/A;  . CORONARY ARTERY BYPASS GRAFT  09/2004   SVG to LAD (at [facility])  .  DIRECT LARYNGOSCOPY N/A 07/06/2014   Procedure: DIRECT LARYNGOSCOPY AND ESOPAGOSCOPY WITH BIOPSY;  Surgeon: [name], MD;  Location: [place];  Service: ENT;  Laterality: N/A;  . ESOPHAGOSCOPY WITH DILITATION N/A 10/31/2015   Procedure: ESOPHAGOSCOPY WITH DILITATION;  Surgeon: [name], MD;  Location: [place];  Service: ENT;  Laterality: N/A;  . ILIAC ARTERY STENT  07/24/2008   8x6 Smart nitinol self-expanding stent to origin of ilaci down into external iliac crossing the hypogastric artery (Dr. Adora Fridge)  . [place] REPAIR  06/13/2015  . LAPAROTOMY N/A 08/05/2014   Procedure: EXPLORATORY LAPAROTOMY WITH SIGMOIDOTOMY WITH COLECTOMY & COLOSTOMY;  Surgeon: Coralie Keens, MD;  Location: [place];  Service: General;  Laterality: N/A;  . LEFT HEART CATHETERIZATION WITH CORONARY/GRAFT ANGIOGRAM N/A 06/15/2013   Procedure: LEFT HEART CATHETERIZATION WITH Beatrix Fetters;  Surgeon: Sinclair Grooms, MD;  Location: [facility];  Service: Cardiovascular;  Laterality: N/A;  . LOWER EXTREMITY ARTERIAL DOPPLER  08/2008   right and left ABI - mild arterial insufficiency at rest; R CIA/stent with 50-69% narrowing, L CIA with increased velocities (50-69% diameter reduction)  . NM MYOCAR PERF WALL MOTION  06/2008   persantine myoview - normal pattern of systolic thickening/wall motion/perfusion;  . PEG TUBE REMOVAL    . PERIPHERAL VASCULAR CATHETERIZATION  05/2013   stent to SVG - LAD vein [facility]  . PERIPHERAL VASCULAR CATHETERIZATION N/A 05/08/2015   Procedure: Aortic Arch Angiography;  Surgeon: Serafina Mitchell, MD;  Location: [place] [facility];  Service: Cardiovascular;  Laterality: N/A;  . PERIPHERAL VASCULAR CATHETERIZATION N/A 05/08/2015   Procedure: Carotid Angiography;  Surgeon: Serafina Mitchell, MD;  Location: [place] [facility];  Service: Cardiovascular;  Laterality: N/A;  . PERIPHERAL VASCULAR CATHETERIZATION  06/13/2015   Procedure: PORTA CATH REMOVAL;  Surgeon: Coralie Keens, MD;  Location: [place];  Service: General;;  . PORT-A-CATH REMOVAL  06/13/2015  . RENAL ARTERY DOPPLER  05/2008   right and left prox renal arteries with narrowing and increased velocities (60-99% diameter reduction)  . SUBCLAVIAN ANGIOGRAM Left 08/20/2004   8x20mm Genesis balloon mounted stent    Outpatient Medications Prior to Visit  Medication Sig Dispense Refill  . ALPRAZolam (XANAX) 0.5 MG tablet Take 0.5 mg by mouth 3 (three) times daily.     Marland Kitchen buPROPion (WELLBUTRIN) 100 MG tablet Take 100 mg by mouth 3 (three) times daily.     . carvedilol (COREG) 6.25 MG tablet TAKE 1 TABLET(6.25 MG) BY MOUTH TWICE DAILY WITH A MEAL 60 tablet 0  . ranitidine (ZANTAC) 75 MG tablet Take 25 mg by mouth.    Marland Kitchen aspirin 325 MG tablet Take 325 mg by mouth daily.    Marland Kitchen atorvastatin (LIPITOR) 80 MG tablet Take 1 tablet (80 mg total) by mouth daily. 90 tablet 3  . atorvastatin (LIPITOR) 40 MG tablet Take 40 mg by mouth.     . famotidine (PEPCID) 20 MG tablet Take 20 mg by mouth daily.     No facility-administered medications prior to visit.      Allergies:   Cetuximab   Social History   Socioeconomic History  . Marital status: Divorced    Spouse name: None  . Number of children: 9  . Years of education: 10  . Highest education level: None  Social Needs  . Financial resource strain: None  . Food insecurity - worry: None  . Food insecurity - inability: None  . Transportation needs - medical: None  .  Transportation needs - non-medical: None  Occupational History  . Occupation: Games developer  Tobacco Use  . Smoking status: Former Smoker    Packs/day: 4.00    Years: 45.00    Pack years: 180.00    Types: Cigarettes    Last attempt to quit: 06/09/2013    Years since quitting: 3.6  . Smokeless tobacco: Former Network engineer and Sexual Activity  . Alcohol use: Yes    Alcohol/week: 12.6 oz    Types: 21 Cans of beer per week  . Drug use: No  . Sexual activity: No  Other Topics Concern  . None  Social History Narrative  . None     Family History:  The patient's family history includes Cancer in his father; Heart Problems in his sister; Heart attack in his mother; Heart disease in his maternal aunt; Stroke in his mother and sister.   ROS:   Please see the history of present illness.    ROS All other systems reviewed and are negative.   PHYSICAL EXAM:   VS:  BP 130/74 (BP Location: Left Arm, Patient Position: Sitting, Cuff Size: Normal)   Pulse 78   Ht 5\' 7"  (1.702 m)   Wt 161 lb 12.8 oz (73.4 kg)   BMI 25.34 kg/m     General: Alert, oriented x3, no distress, appears well Head: no evidence of trauma, PERRL, EOMI, no exophtalmos or lid lag, no myxedema, no xanthelasma; normal ears, nose and oropharynx Neck: Extensive scarring from previous surgery and radiation therapy, normal jugular venous pulsations and no hepatojugular reflux; brisk carotid pulses without delay and no carotid  bruits Chest: clear to auscultation, no signs of consolidation by percussion or palpation, normal fremitus, symmetrical and full respiratory excursions Cardiovascular: normal position and quality of the apical impulse, regular rhythm, normal first and second heart sounds, no murmurs, rubs or gallops Abdomen: no tenderness or distention, no masses by palpation, no abnormal pulsatility or arterial bruits, normal bowel sounds, no hepatosplenomegaly Extremities: no clubbing, cyanosis or edema; 2+ radial, ulnar and brachial pulses bilaterally; 2+ right femoral, posterior tibial and dorsalis pedis pulses; 2+ left femoral, posterior tibial and dorsalis pedis pulses; no subclavian or femoral bruits Neurological: grossly nonfocal Psych: Normal mood and affect   Wt Readings from Last 3 Encounters:  02/05/17 161 lb 12.8 oz (73.4 kg)  10/23/16 155 lb 9.6 oz (70.6 kg)  08/18/16 155 lb (70.3 kg)      Studies/Labs Reviewed:   EKG:  EKG is not ordered today.   Recent Labs: 07/11/2016: BUN 8; Creatinine, Ser 0.92; Hemoglobin 10.4; Platelets 251; Potassium 3.8; Sodium 136    ASSESSMENT:    1. Chronic combined systolic and diastolic heart failure ([place])   2. Coronary artery disease involving coronary bypass graft of native heart without angina pectoris   3. Dyslipidemia   4. PAD (peripheral artery disease) ([place])   5. Occlusion of left carotid artery      PLAN:  In order of problems listed above:  1. CHF: He does not need diuretics, appears euvolemic, NYHA functional class I-II. Not on RAAS inhibitors, but his EF is minimally depressed and these agents are not immediately necessary. 2. CAD: he does not have angina pectoris. He is on a beta blocker. He can reduce the dose of aspirin to 81 mg daily, especially since he is having GI side effects. Recheck lipids now that he is back on statin.. 3. HLP:  Recheck his lipid profile. Back on statin for about 6 months.  Time to recheck profile. Target LDL  under 70. 4. PAD: Able to walk as much as 10-12 miles a day without frequent claudication, but does have claudication when he walks a long distance without pauses. Discussed the benefit of walking to the limit of claudication to promote development of collaterals. He is not smoking, quit 2 years ago.  5. Carotid stenosis: No recent neurological complaints; follow-up with Dr. Trula Slade on February 11, now roughly 7 months following right carotid artery stent (known chronic total occlusion of the left common carotid).   Medication Adjustments/Labs and Tests Ordered: Current medicines are reviewed at length with the patient today.  Concerns regarding medicines are outlined above.  Medication changes, Labs and Tests ordered today are listed in the Patient Instructions below. Patient Instructions  Medication Instructions: Dr Sallyanne Kuster has recommended making the following medication changes: 1. DECREASE Aspirin to 81 mg daily  Labwork: Your physician recommends that you return for lab work at your convenience - FASTING.  Testing/Procedures: NONE ORDERED  Follow-up: Dr Sallyanne Kuster recommends that you schedule a follow-up appointment in 12 months. You will receive a reminder letter in the mail two months in advance. If you don't receive a letter, please call our office to schedule the follow-up appointment.  If you need a refill on your cardiac medications before your next appointment, please call your pharmacy.      Signed, Sanda Klein, MD  02/05/2017 5:31 PM    [address], [place], [place]  [address] Phone: [phone]; Fax: [phone]

## 2017-02-23 ENCOUNTER — Ambulatory Visit (INDEPENDENT_AMBULATORY_CARE_PROVIDER_SITE_OTHER): Payer: Self-pay | Admitting: Surgery

## 2017-02-23 ENCOUNTER — Ambulatory Visit (HOSPITAL_COMMUNITY)
Admission: RE | Admit: 2017-02-23 | Discharge: 2017-02-23 | Disposition: A | Discharge status: Home / Self Care | Payer: Self-pay | Source: Ambulatory Visit | Attending: Surgery | Admitting: Surgery

## 2017-02-23 ENCOUNTER — Encounter: Payer: Self-pay | Admitting: Surgery

## 2017-02-23 VITALS — BP 131/79 | HR 68 | Resp 20 | Ht 67.0 in | Wt 162.0 lb

## 2017-02-23 DIAGNOSIS — I6521 Occlusion and stenosis of right carotid artery: Secondary | ICD-10-CM

## 2017-02-23 DIAGNOSIS — I6523 Occlusion and stenosis of bilateral carotid arteries: Secondary | ICD-10-CM | POA: Insufficient documentation

## 2017-02-23 LAB — VAS US CAROTID
LEFT ECA DIAS: -16 cm/s
RCCADSYS: -124 cm/s
RIGHT CCA MID DIAS: 20 cm/s
RIGHT ECA DIAS: -27 cm/s
RIGHT VERTEBRAL DIAS: 24 cm/s
Right CCA prox dias: 22 cm/s
Right CCA prox sys: 68 cm/s

## 2017-02-23 NOTE — Progress Notes (Signed)
[facility]  Patient name: [name] MRN: [id number] DOB: February 19, 1955 Sex: male   REASON FOR VISIT:    Follow up  HISOTRY OF PRESENT ILLNESS:     [name] is a 62 y.o. male who is status post right carotid stenting for an asymptomatic lesion on 07/10/2016.  A 90% stenosis was found at angiography..  He has a history of throat cancer and radiation therefore he was not a good candidate for endarterectomy.  He has a known left carotid occlusion  He denies any symptoms since his procedure.  He does not have any numbness or weakness.  His groin site does not bother him.  The patient has a long-standing history of coronary artery disease. He is status post CABG approximately 10 years ago. He is also undergone percutaneous stenting secondary to multiple heart attacks. He also has a history of peripheral vascular disease and has undergone iliac stenting. He has a greater than 100-pack-year smoking history. 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  He reports no interval change.  He denies any symptoms such as numbness or weakness in either extremity, slurred speech, or amaurosis fugax.  PAST MEDICAL HISTORY:   Past Medical History:  Diagnosis Date  . Acute encephalopathy, improved 06/20/2013  . Acute MI, troponin > 20, no obvious culprit vessel 06/20/2013  . Anxiety   . Arthritis   . At risk for sudden cardiac death, has lifevest at discharge 06/20/2013  . Carotid stenosis   . Coronary artery disease    s/p CABG 2006; stenting to vein graft 05-2013; NSTEMI 06-2013 in setting of PEA arrest  . Depression   . Difficulty swallowing solids    Pt unable to swallow solid foods  . Dizziness 06/20/2013  . Dyslipidemia 10/09/2013   [place] Study atorvastatin -eIRB # [id number] tablet Take 40 mg by mouth daily.   . Fever, possible aspiration-treated with antibiotics 06/20/2013   . GERD (gastroesophageal reflux disease)   . Heart failure, acute on chronic, systolic and diastolic ([place]) 42/59/5638  . Heart murmur   . History of radiation therapy 09/05/14-10/23/14   larynx 70 Gy  . Hyperlipidemia   . Hypertension   . Hypothermia, induced, post arrest 06/20/2013  . Incisional hernia   . Ischemic cardiomyopathy    echo 06/2013 EF 10-15% (normal 01/2013)  . PAD (peripheral artery disease) ([place]) 10/09/2013   Occluded left common carotid and moderate right internal carotid artery stenosis. Previous stent to the left subclavian artery. Right iliac stent. Bilateral 60-99% renal artery stenosis    . Radiation-induced esophageal stricture   . Shock liver 06/20/2013  . Shortness of breath dyspnea    with exertion  . Squamous cell carcinoma of larynx ([place]) 07/14/2014  . Subclavian artery stenosis, left (HCC)    s/p stenting 2006  . Tobacco abuse    >100 pack year history   . Weakness due to cardiac arrest 06/20/2013  . Wears glasses      FAMILY HISTORY:   Family History  Problem Relation Age of Onset  . Heart disease Maternal Aunt   . Heart attack Mother   . Stroke Mother   . Cancer Father   . Stroke Sister   . Heart Problems Sister     SOCIAL HISTORY:   Social History   Tobacco Use  . Smoking status: Former Smoker    Packs/day: 4.00    Years: 45.00    Pack  years: 180.00    Types: Cigarettes    Last attempt to quit: 06/09/2013    Years since quitting: 3.7  . Smokeless tobacco: Former Network engineer Use Topics  . Alcohol use: Yes    Alcohol/week: 12.6 oz    Types: 21 Cans of beer per week     ALLERGIES:   Allergies  Allergen Reactions  . Cetuximab Anaphylaxis    Cardiac arrest 08/02/2014     CURRENT MEDICATIONS:   Current Outpatient Medications  Medication Sig Dispense Refill  . ALPRAZolam (XANAX) 0.5 MG tablet Take 0.5 mg by mouth 3 (three) times daily.     Marland Kitchen aspirin 81 MG EC tablet Take 1 tablet (81 mg total) by mouth daily. 30 tablet 12  .  buPROPion (WELLBUTRIN) 100 MG tablet Take 100 mg by mouth 3 (three) times daily.     . carvedilol (COREG) 6.25 MG tablet TAKE 1 TABLET(6.25 MG) BY MOUTH TWICE DAILY WITH A MEAL 60 tablet 0  . ranitidine (ZANTAC) 75 MG tablet Take 25 mg by mouth.    Marland Kitchen atorvastatin (LIPITOR) 80 MG tablet Take 1 tablet (80 mg total) by mouth daily. 90 tablet 3   No current facility-administered medications for this visit.     REVIEW OF SYSTEMS:   [X]  denotes positive finding, [ ]  denotes negative finding Cardiac  Comments:  Chest pain or chest pressure:    Shortness of breath upon exertion:    Short of breath when lying flat:    Irregular heart rhythm:        Vascular    Pain in calf, thigh, or hip brought on by ambulation:    Pain in feet at night that wakes you up from your sleep:     Blood clot in your veins:    Leg swelling:         Pulmonary    Oxygen at home:    Productive cough:     Wheezing:         Neurologic    Sudden weakness in arms or legs:     Sudden numbness in arms or legs:     Sudden onset of difficulty speaking or slurred speech:    Temporary loss of vision in one eye:     Problems with dizziness:         Gastrointestinal    Blood in stool:     Vomited blood:         Genitourinary    Burning when urinating:     Blood in urine:        Psychiatric    Major depression:         Hematologic    Bleeding problems:    Problems with blood clotting too easily:        Skin    Rashes or ulcers:        Constitutional    Fever or chills:      PHYSICAL EXAM:   Vitals:   02/23/17 1220 02/23/17 1221  BP: 133/80 131/79  Pulse: 68   Resp: 20   SpO2: 99%   Weight: 162 lb (73.5 kg)   Height: 5\' 7"  (1.702 m)     GENERAL: The patient is a well-nourished male, in no acute distress. The vital signs are documented above. CARDIAC: There is a regular rate and rhythm.  PULMONARY: Non-labored respirations MUSCULOSKELETAL: There are no major deformities or  cyanosis. NEUROLOGIC: No focal weakness or paresthesias are detected. SKIN: There are no ulcers  or rashes noted. PSYCHIATRIC: The patient has a normal affect.  STUDIES:   I have ordered and reviewed his duplex.  This shows elevated velocities within the right carotid stent, in the 60-79% category.  Left carotid is known to be totally occluded  MEDICAL ISSUES:   Right carotid stenosis: I discussed the ultrasound findings today with the patient.  No intervention is recommended.  He will continue with maximal medical therapy.  He will have a follow-up duplex in 6 months.    Annamarie Major, MD [facility] [phone] Pager [phone]

## 2017-02-24 ENCOUNTER — Other Ambulatory Visit: Payer: Self-pay

## 2017-02-24 DIAGNOSIS — I6529 Occlusion and stenosis of unspecified carotid artery: Secondary | ICD-10-CM

## 2017-03-02 ENCOUNTER — Ambulatory Visit: Payer: Self-pay | Admitting: Radiation Oncology

## 2017-03-30 ENCOUNTER — Other Ambulatory Visit: Payer: Self-pay

## 2017-03-30 ENCOUNTER — Ambulatory Visit
Admission: RE | Admit: 2017-03-30 | Discharge: 2017-03-30 | Disposition: A | Discharge status: Home / Self Care | Payer: Self-pay | Source: Ambulatory Visit | Attending: Radiation Oncology | Admitting: Radiation Oncology

## 2017-03-30 ENCOUNTER — Encounter: Payer: Self-pay | Admitting: Radiation Oncology

## 2017-03-30 VITALS — BP 149/85 | HR 82 | Temp 98.0°F | Ht 67.0 in | Wt 164.8 lb

## 2017-03-30 DIAGNOSIS — C329 Malignant neoplasm of larynx, unspecified: Secondary | ICD-10-CM | POA: Insufficient documentation

## 2017-03-30 NOTE — Progress Notes (Signed)
Garvey is here for follow up.  He denies having any pain, dry mouth, sore throat or trouble swallowing.  He did mention he has trouble swallowing large pills like a multivitamin.  He reports having fatigue.  He saw Dr. [name] in February and he will see him again in June.  BP (!) 149/85 (BP Location: Right Arm, Patient Position: Sitting)   Pulse 82   Temp 98 F (36.7 C) (Oral)   Ht 5\' 7"  (1.702 m)   Wt 164 lb 12.8 oz (74.8 kg)   SpO2 100%   BMI 25.81 kg/m    Wt Readings from Last 3 Encounters:  03/30/17 164 lb 12.8 oz (74.8 kg)  02/23/17 162 lb (73.5 kg)  02/05/17 161 lb 12.8 oz (73.4 kg)

## 2017-03-31 NOTE — Addendum Note (Signed)
Encounter addended by: Hayden Pedro, PA-C on: 03/31/2017 6:40 AM  Actions taken: Sign clinical note, LOS modified, Follow-up modified

## 2017-03-31 NOTE — Progress Notes (Signed)
The patient was recently examined by Dr. [name] and after discussing that the patient did not have any concerns with new symptoms, his appointment was cancelled and rescheduled for a more appropriate interval for surveillance.

## 2017-07-01 ENCOUNTER — Other Ambulatory Visit: Payer: Self-pay | Admitting: Cardiology

## 2017-08-24 ENCOUNTER — Encounter (HOSPITAL_COMMUNITY): Payer: Self-pay

## 2017-08-24 ENCOUNTER — Ambulatory Visit: Payer: Self-pay | Admitting: Surgery

## 2017-08-31 ENCOUNTER — Other Ambulatory Visit: Payer: Self-pay

## 2017-08-31 ENCOUNTER — Encounter: Payer: Self-pay | Admitting: Surgery

## 2017-08-31 ENCOUNTER — Ambulatory Visit (INDEPENDENT_AMBULATORY_CARE_PROVIDER_SITE_OTHER): Payer: Self-pay | Admitting: Surgery

## 2017-08-31 ENCOUNTER — Ambulatory Visit (HOSPITAL_COMMUNITY)
Admission: RE | Admit: 2017-08-31 | Discharge: 2017-08-31 | Disposition: A | Discharge status: Home / Self Care | Payer: Self-pay | Source: Ambulatory Visit | Attending: Surgery | Admitting: Surgery

## 2017-08-31 VITALS — BP 152/89 | HR 81 | Temp 97.4°F | Resp 16 | Ht 67.0 in | Wt 167.0 lb

## 2017-08-31 DIAGNOSIS — I6529 Occlusion and stenosis of unspecified carotid artery: Secondary | ICD-10-CM

## 2017-08-31 DIAGNOSIS — Z87891 Personal history of nicotine dependence: Secondary | ICD-10-CM | POA: Insufficient documentation

## 2017-08-31 DIAGNOSIS — I2510 Atherosclerotic heart disease of native coronary artery without angina pectoris: Secondary | ICD-10-CM | POA: Insufficient documentation

## 2017-08-31 DIAGNOSIS — I252 Old myocardial infarction: Secondary | ICD-10-CM | POA: Insufficient documentation

## 2017-08-31 DIAGNOSIS — I6523 Occlusion and stenosis of bilateral carotid arteries: Secondary | ICD-10-CM | POA: Insufficient documentation

## 2017-08-31 DIAGNOSIS — I6521 Occlusion and stenosis of right carotid artery: Secondary | ICD-10-CM

## 2017-08-31 MED ORDER — CLOPIDOGREL BISULFATE 75 MG PO TABS: 75.0000 mg | ORAL_TABLET | Freq: Every day | ORAL | 6 refills | Status: DC

## 2017-08-31 NOTE — Progress Notes (Signed)
Vascular and Vein Specialist of [facility]  Patient name: [name] MRN: [id number] DOB: 1955/11/28 Sex: male   REASON FOR VISIT:    Follow up  HISOTRY OF PRESENT ILLNESS:   [name] is a 62 y.o. male who is status post right carotid stenting for an asymptomatic lesion on 07/10/2016.  A 90% stenosis was found at angiography..  He has a history of throat cancer and radiation therefore he was not a good candidate for endarterectomy.  He has a known left carotid occlusion  He has not had any neurologic symptoms  The patient has a long-standing history of coronary artery disease. He is status post CABG approximately 10 years ago. He is also undergone percutaneous stenting secondary to multiple heart attacks. He also has a history of peripheral vascular disease and has undergone iliac stenting. He has a greater than 100-pack-year smoking history. 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   PAST MEDICAL HISTORY:   Past Medical History:  Diagnosis Date  . Acute encephalopathy, improved 06/20/2013  . Acute MI, troponin > 20, no obvious culprit vessel 06/20/2013  . Anxiety   . Arthritis   . At risk for sudden cardiac death, has lifevest at discharge 06/20/2013  . Carotid stenosis   . Coronary artery disease    s/p CABG 2006; stenting to vein graft 05-2013; NSTEMI 06-2013 in setting of PEA arrest  . Depression   . Difficulty swallowing solids    Pt unable to swallow solid foods  . Dizziness 06/20/2013  . Dyslipidemia 10/09/2013   [place] Study atorvastatin -eIRB # [id number] tablet Take 40 mg by mouth daily.   . Fever, possible aspiration-treated with antibiotics 06/20/2013  . GERD (gastroesophageal reflux disease)   . Heart failure, acute on chronic, systolic and diastolic ([place]) 15/17/6160  . Heart murmur   . History of radiation therapy 09/05/14-10/23/14   larynx 70 Gy  . Hyperlipidemia   .  Hypertension   . Hypothermia, induced, post arrest 06/20/2013  . Incisional hernia   . Ischemic cardiomyopathy    echo 06/2013 EF 10-15% (normal 01/2013)  . PAD (peripheral artery disease) ([place]) 10/09/2013   Occluded left common carotid and moderate right internal carotid artery stenosis. Previous stent to the left subclavian artery. Right iliac stent. Bilateral 60-99% renal artery stenosis    . Radiation-induced esophageal stricture   . Shock liver 06/20/2013  . Shortness of breath dyspnea    with exertion  . Squamous cell carcinoma of larynx ([place]) 07/14/2014  . Subclavian artery stenosis, left (HCC)    s/p stenting 2006  . Tobacco abuse    >100 pack year history   . Weakness due to cardiac arrest 06/20/2013  . Wears glasses      FAMILY HISTORY:   Family History  Problem Relation Age of Onset  . Heart disease Maternal Aunt   . Heart attack Mother   . Stroke Mother   . Cancer Father   . Stroke Sister   . Heart Problems Sister     SOCIAL HISTORY:   Social History   Tobacco Use  . Smoking status: Former Smoker    Packs/day: 4.00    Years: 45.00    Pack years: 180.00    Types: Cigarettes    Last attempt to quit: 06/09/2013    Years since quitting: 4.2  . Smokeless tobacco: Former Network engineer Use Topics  . Alcohol use: Yes    Alcohol/week: 21.0  standard drinks    Types: 21 Cans of beer per week     ALLERGIES:   Allergies  Allergen Reactions  . Cetuximab Anaphylaxis    Cardiac arrest 08/02/2014     CURRENT MEDICATIONS:   Current Outpatient Medications  Medication Sig Dispense Refill  . ALPRAZolam (XANAX) 0.5 MG tablet Take 0.5 mg by mouth 3 (three) times daily.     Marland Kitchen aspirin 81 MG EC tablet Take 1 tablet (81 mg total) by mouth daily. 30 tablet 12  . atorvastatin (LIPITOR) 80 MG tablet TAKE 1 TABLET(80 MG) BY MOUTH DAILY 90 tablet 1  . buPROPion (WELLBUTRIN) 100 MG tablet Take 100 mg by mouth 3 (three) times daily.     . carvedilol (COREG) 6.25 MG tablet TAKE 1  TABLET(6.25 MG) BY MOUTH TWICE DAILY WITH A MEAL 60 tablet 0  . ranitidine (ZANTAC) 75 MG tablet Take 25 mg by mouth.     No current facility-administered medications for this visit.     REVIEW OF SYSTEMS:   [X]  denotes positive finding, [ ]  denotes negative finding Cardiac  Comments:  Chest pain or chest pressure:    Shortness of breath upon exertion:    Short of breath when lying flat:    Irregular heart rhythm:        Vascular    Pain in calf, thigh, or hip brought on by ambulation:    Pain in feet at night that wakes you up from your sleep:     Blood clot in your veins:    Leg swelling:         Pulmonary    Oxygen at home:    Productive cough:     Wheezing:         Neurologic    Sudden weakness in arms or legs:     Sudden numbness in arms or legs:     Sudden onset of difficulty speaking or slurred speech:    Temporary loss of vision in one eye:     Problems with dizziness:         Gastrointestinal    Blood in stool:     Vomited blood:         Genitourinary    Burning when urinating:     Blood in urine:        Psychiatric    Major depression:         Hematologic    Bleeding problems:    Problems with blood clotting too easily:        Skin    Rashes or ulcers:        Constitutional    Fever or chills:      PHYSICAL EXAM:   There were no vitals filed for this visit.  GENERAL: The patient is a well-nourished male, in no acute distress. The vital signs are documented above. CARDIAC: There is a regular rate and rhythm.  PULMONARY: Non-labored respirations MUSCULOSKELETAL: There are no major deformities or cyanosis. NEUROLOGIC: No focal weakness or paresthesias are detected. SKIN: There are no ulcers or rashes noted. PSYCHIATRIC: The patient has a normal affect.  STUDIES:   I have reviewed his vascular lab studies with the following findings: Right carotid: Velocities are consistent with a 80-99% stenosis however no hemodynamically significant  stenosis was identified.  Left carotid artery is known to be occluded.  MEDICAL ISSUES:   Carotid stenosis: Ultrasound today suggest that his stenosis has progressed to greater than 80%.  Because he has a  known carotid occlusion on the left, I think this needs to be addressed.  I am starting him back on Plavix.  He is currently taking a baby aspirin.  He will be scheduled for right carotid angiography and possible stenting in September.    Annamarie Major, MD Vascular and Vein Specialists of [facility] [phone] Pager [phone]

## 2017-09-08 ENCOUNTER — Other Ambulatory Visit: Payer: Self-pay | Admitting: *Deleted

## 2017-09-08 NOTE — Progress Notes (Signed)
Instructed to be at [facility] admitting department at 5:30 am on 10/01/17 for procedure. NPO past MN night prior and take the following medications with sips of water am of: Coreg, Plavix and ASA. Plan to stay overnight. To call this office if any questions. Verbalized understanding.

## 2017-10-01 ENCOUNTER — Ambulatory Visit (HOSPITAL_COMMUNITY)
Admission: RE | Admit: 2017-10-01 | Discharge: 2017-10-01 | Disposition: A | Discharge status: Home / Self Care | Payer: Self-pay | Source: Ambulatory Visit | Attending: Surgery | Admitting: Surgery

## 2017-10-01 ENCOUNTER — Encounter (HOSPITAL_COMMUNITY)
Admission: RE | Disposition: A | Discharge status: Home / Self Care | Payer: Self-pay | Source: Ambulatory Visit | Attending: Surgery

## 2017-10-01 ENCOUNTER — Other Ambulatory Visit: Payer: Self-pay

## 2017-10-01 DIAGNOSIS — Z951 Presence of aortocoronary bypass graft: Secondary | ICD-10-CM | POA: Insufficient documentation

## 2017-10-01 DIAGNOSIS — E785 Hyperlipidemia, unspecified: Secondary | ICD-10-CM | POA: Insufficient documentation

## 2017-10-01 DIAGNOSIS — I2510 Atherosclerotic heart disease of native coronary artery without angina pectoris: Secondary | ICD-10-CM | POA: Insufficient documentation

## 2017-10-01 DIAGNOSIS — I6523 Occlusion and stenosis of bilateral carotid arteries: Secondary | ICD-10-CM | POA: Insufficient documentation

## 2017-10-01 DIAGNOSIS — Z79899 Other long term (current) drug therapy: Secondary | ICD-10-CM | POA: Insufficient documentation

## 2017-10-01 DIAGNOSIS — Y831 Surgical operation with implant of artificial internal device as the cause of abnormal reaction of the patient, or of later complication, without mention of misadventure at the time of the procedure: Secondary | ICD-10-CM | POA: Insufficient documentation

## 2017-10-01 DIAGNOSIS — I110 Hypertensive heart disease with heart failure: Secondary | ICD-10-CM | POA: Insufficient documentation

## 2017-10-01 DIAGNOSIS — K219 Gastro-esophageal reflux disease without esophagitis: Secondary | ICD-10-CM | POA: Insufficient documentation

## 2017-10-01 DIAGNOSIS — Z8249 Family history of ischemic heart disease and other diseases of the circulatory system: Secondary | ICD-10-CM | POA: Insufficient documentation

## 2017-10-01 DIAGNOSIS — I6521 Occlusion and stenosis of right carotid artery: Secondary | ICD-10-CM

## 2017-10-01 DIAGNOSIS — Z7902 Long term (current) use of antithrombotics/antiplatelets: Secondary | ICD-10-CM | POA: Insufficient documentation

## 2017-10-01 DIAGNOSIS — Z7982 Long term (current) use of aspirin: Secondary | ICD-10-CM | POA: Insufficient documentation

## 2017-10-01 DIAGNOSIS — Z809 Family history of malignant neoplasm, unspecified: Secondary | ICD-10-CM | POA: Insufficient documentation

## 2017-10-01 DIAGNOSIS — I255 Ischemic cardiomyopathy: Secondary | ICD-10-CM | POA: Insufficient documentation

## 2017-10-01 DIAGNOSIS — Z888 Allergy status to other drugs, medicaments and biological substances status: Secondary | ICD-10-CM | POA: Insufficient documentation

## 2017-10-01 DIAGNOSIS — Z8521 Personal history of malignant neoplasm of larynx: Secondary | ICD-10-CM | POA: Insufficient documentation

## 2017-10-01 DIAGNOSIS — F329 Major depressive disorder, single episode, unspecified: Secondary | ICD-10-CM | POA: Insufficient documentation

## 2017-10-01 DIAGNOSIS — Z823 Family history of stroke: Secondary | ICD-10-CM | POA: Insufficient documentation

## 2017-10-01 DIAGNOSIS — I252 Old myocardial infarction: Secondary | ICD-10-CM | POA: Insufficient documentation

## 2017-10-01 DIAGNOSIS — Z87891 Personal history of nicotine dependence: Secondary | ICD-10-CM | POA: Insufficient documentation

## 2017-10-01 DIAGNOSIS — M1990 Unspecified osteoarthritis, unspecified site: Secondary | ICD-10-CM | POA: Insufficient documentation

## 2017-10-01 DIAGNOSIS — I739 Peripheral vascular disease, unspecified: Secondary | ICD-10-CM | POA: Diagnosis present

## 2017-10-01 DIAGNOSIS — Z8674 Personal history of sudden cardiac arrest: Secondary | ICD-10-CM | POA: Insufficient documentation

## 2017-10-01 DIAGNOSIS — T82855A Stenosis of coronary artery stent, initial encounter: Secondary | ICD-10-CM | POA: Insufficient documentation

## 2017-10-01 DIAGNOSIS — F419 Anxiety disorder, unspecified: Secondary | ICD-10-CM | POA: Insufficient documentation

## 2017-10-01 LAB — POCT I-STAT, CHEM 8
BUN: 18 mg/dL (ref 8–23)
CALCIUM ION: 1.17 mmol/L (ref 1.15–1.40)
CHLORIDE: 102 mmol/L (ref 98–111)
Creatinine, Ser: 1.1 mg/dL (ref 0.61–1.24)
GLUCOSE: 107 mg/dL — AB (ref 70–99)
HCT: 34 % — ABNORMAL LOW (ref 39.0–52.0)
HEMOGLOBIN: 11.6 g/dL — AB (ref 13.0–17.0)
Potassium: 4.7 mmol/L (ref 3.5–5.1)
SODIUM: 138 mmol/L (ref 135–145)
TCO2: 30 mmol/L (ref 22–32)

## 2017-10-01 SURGERY — CAROTID ANGIOGRAM

## 2017-10-01 MED ORDER — SODIUM CHLORIDE 0.9 % WEIGHT BASED INFUSION: 1.0000 mL/kg/h | INTRAVENOUS | Status: DC

## 2017-10-01 MED ORDER — SODIUM CHLORIDE 0.9 % IV SOLN: 250.0000 mL | INTRAVENOUS | Status: DC | PRN

## 2017-10-01 MED ORDER — SODIUM CHLORIDE 0.9 % IV SOLN
INTRAVENOUS | Status: DC
  Administered 2017-10-01: 06:00:00 via INTRAVENOUS

## 2017-10-01 MED ORDER — ONDANSETRON HCL 4 MG/2ML IJ SOLN: 4.0000 mg | Freq: Four times a day (QID) | INTRAMUSCULAR | Status: DC | PRN

## 2017-10-01 MED ORDER — IODIXANOL 320 MG/ML IV SOLN
INTRAVENOUS | Status: DC | PRN
  Administered 2017-10-01: 55 mL via INTRAVENOUS

## 2017-10-01 MED ORDER — SODIUM CHLORIDE 0.9% FLUSH: 3.0000 mL | Freq: Two times a day (BID) | INTRAVENOUS | Status: DC

## 2017-10-01 MED ORDER — LABETALOL HCL 5 MG/ML IV SOLN: 10.0000 mg | INTRAVENOUS | Status: DC | PRN

## 2017-10-01 MED ORDER — LIDOCAINE HCL (PF) 1 % IJ SOLN
INTRAMUSCULAR | Status: AC
  Filled 2017-10-01: qty 30

## 2017-10-01 MED ORDER — ACETAMINOPHEN 325 MG PO TABS: 650.0000 mg | ORAL_TABLET | ORAL | Status: DC | PRN

## 2017-10-01 MED ORDER — HYDRALAZINE HCL 20 MG/ML IJ SOLN: 5.0000 mg | INTRAMUSCULAR | Status: DC | PRN

## 2017-10-01 MED ORDER — LIDOCAINE HCL (PF) 1 % IJ SOLN
INTRAMUSCULAR | Status: DC | PRN
  Administered 2017-10-01: 15 mL

## 2017-10-01 MED ORDER — HEPARIN (PORCINE) IN NACL 1000-0.9 UT/500ML-% IV SOLN
INTRAVENOUS | Status: DC | PRN
  Administered 2017-10-01 (×2): 500 mL

## 2017-10-01 MED ORDER — SODIUM CHLORIDE 0.9% FLUSH: 3.0000 mL | INTRAVENOUS | Status: DC | PRN

## 2017-10-01 MED ORDER — HEPARIN (PORCINE) IN NACL 1000-0.9 UT/500ML-% IV SOLN
INTRAVENOUS | Status: AC
  Filled 2017-10-01: qty 1000

## 2017-10-01 MED ORDER — ASPIRIN EC 81 MG PO TBEC: 81.0000 mg | DELAYED_RELEASE_TABLET | Freq: Every day | ORAL | Status: DC

## 2017-10-01 SURGICAL SUPPLY — 13 items
CATH ANGIO 5F BER2 100CM (CATHETERS) ×4 IMPLANT
CATH ANGIO 5F PIGTAIL 100CM (CATHETERS) ×4 IMPLANT
CATH TEMPO AQUA 5F 100CM (CATHETERS) ×8 IMPLANT
DEVICE CLOSURE MYNXGRIP 5F (Vascular Products) ×4 IMPLANT
KIT MICROPUNCTURE NIT STIFF (SHEATH) ×4 IMPLANT
KIT PV (KITS) ×4 IMPLANT
SHEATH PINNACLE 5F 10CM (SHEATH) ×4 IMPLANT
SHEATH PROBE COVER 6X72 (BAG) ×4 IMPLANT
SYR MEDRAD MARK V 150ML (SYRINGE) ×4 IMPLANT
TRANSDUCER W/STOPCOCK (MISCELLANEOUS) ×4 IMPLANT
TRAY PV CATH (CUSTOM PROCEDURE TRAY) ×4 IMPLANT
WIRE BENTSON .035X145CM (WIRE) ×4 IMPLANT
WIRE HI TORQ VERSACORE J 260CM (WIRE) ×4 IMPLANT

## 2017-10-01 NOTE — Op Note (Signed)
    Patient name: [name] MRN: [id number] DOB: 02-May-1955 Sex: male  10/01/2017 Pre-operative Diagnosis: right carotid stenosis Post-operative diagnosis:  Same Surgeon:  Annamarie Major Procedure Performed:  1.  U/s guided access, right femoral artery  2.  Right carotid angiogram  3.  Aortic arch angiogram  4.  Second-order catheterization (right carotid artery)  5.  Closure device (Mynx)     Indications: Patient has previously undergone right carotid stenting.  He has a known to the left carotid artery.  He has had neck radiation.  His most recent Doppler studies have suggested progression of the stenosis, now greater than 80%.  He comes in today for further evaluation and possible intervention.  Procedure:  The patient was identified in the holding area and taken to room 8.  The patient was then placed supine on the table and prepped and draped in the usual sterile fashion.  A time out was called.  Ultrasound was used to evaluate the right common femoral artery.  It was patent .  A digital ultrasound image was acquired.  A micropuncture needle was used to access the right common femoral artery under ultrasound guidance.  An 018 wire was advanced without resistance and a micropuncture sheath was placed.  The 018 wire was removed and a benson wire was placed.  The micropuncture sheath was exchanged for a 5 french sheath.  A pigtail catheter was advanced into the aortic arch and aortic arch angiogram was performed.  Next using a Berenstein 2 catheter, the right common carotid artery was selected and a right carotid angiogram with intracranial images was obtained.  Intracranial imaging will be separately dictated by neuroradiology.  Findings:   Aortic arch: A type I aortic arch is identified.  There are calcific changes to the proximal innominate artery without significant stenosis.  The left carotid artery is occluded.  There is a stent within the origin of the left subclavian artery which  is widely patent.  Dominant right vertebral artery.  Right : The right common carotid artery is widely patent.  The right external carotid artery remains patent without stenosis.  A stent is visualized in the internal carotid artery with in-stent stenosis approximately 30-40%.   Intervention: None  Impression:  #1 approximately 30-40% in-stent stenosis of the right carotid stent  #2 dominant right vertebral artery  #3 occluded left carotid artery   V. Annamarie Major, M.D. Vascular and Vein Specialists of [place] Office: [phone] Pager:  [phone]

## 2017-10-01 NOTE — Progress Notes (Signed)
Pt asking if he still needs to take Plavix, call placed to Dr Trula Slade. Dr Etta Quill states that he can stop taking Plavix. Mr Flury informed.

## 2017-10-01 NOTE — H&P (Signed)
Vascular and Vein Specialist of [facility]  Patient name: [name]       MRN: [id number]        DOB: [birth year]            Sex: male   REASON FOR VISIT:    Follow up  HISOTRY OF PRESENT ILLNESS:   Edward D Crawfordis a 62 y.o.malewho is status post right carotid stenting for an asymptomatic lesion on 07/10/2016. A 90% stenosis was found at angiography.. He has a history of throat cancer and radiation therefore he was not a good candidate for endarterectomy. He has a known left carotid occlusion  He has not had any neurologic symptoms  The patient has a long-standing history of coronary artery disease. He is status post CABG approximately 10 years ago. He is also undergone percutaneous stenting secondary to multiple heart attacks. He also has a history of peripheral vascular disease and has undergone iliac stenting. He has a greater than 100-pack-year smoking history. 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   PAST MEDICAL HISTORY:       Past Medical History:  Diagnosis Date  . Acute encephalopathy, improved 06/20/2013  . Acute MI, troponin > 20, no obvious culprit vessel 06/20/2013  . Anxiety   . Arthritis   . At risk for sudden cardiac death, has lifevest at discharge 06/20/2013  . Carotid stenosis   . Coronary artery disease    s/p CABG 2006; stenting to vein graft 05-2013; NSTEMI 06-2013 in setting of PEA arrest  . Depression   . Difficulty swallowing solids    Pt unable to swallow solid foods  . Dizziness 06/20/2013  . Dyslipidemia 10/09/2013   [place] Study atorvastatin -eIRB # [id number] tablet Take 40 mg by mouth daily.   . Fever, possible aspiration-treated with antibiotics 06/20/2013  . GERD (gastroesophageal reflux disease)   . Heart failure, acute on chronic, systolic and diastolic ([place]) 25/05/3974  . Heart murmur   . History of radiation therapy 09/05/14-10/23/14   larynx 70  Gy  . Hyperlipidemia   . Hypertension   . Hypothermia, induced, post arrest 06/20/2013  . Incisional hernia   . Ischemic cardiomyopathy    echo 06/2013 EF 10-15% (normal 01/2013)  . PAD (peripheral artery disease) ([place]) 10/09/2013   Occluded left common carotid and moderate right internal carotid artery stenosis. Previous stent to the left subclavian artery. Right iliac stent. Bilateral 60-99% renal artery stenosis    . Radiation-induced esophageal stricture   . Shock liver 06/20/2013  . Shortness of breath dyspnea    with exertion  . Squamous cell carcinoma of larynx ([place]) 07/14/2014  . Subclavian artery stenosis, left (HCC)    s/p stenting 2006  . Tobacco abuse    >100 pack year history   . Weakness due to cardiac arrest 06/20/2013  . Wears glasses      FAMILY HISTORY:        Family History  Problem Relation Age of Onset  . Heart disease Maternal Aunt   . Heart attack Mother   . Stroke Mother   . Cancer Father   . Stroke Sister   . Heart Problems Sister     SOCIAL HISTORY:   Social History        Tobacco Use  . Smoking status: Former Smoker    Packs/day: 4.00    Years: 45.00    Pack years: 180.00    Types: Cigarettes  Last attempt to quit: 06/09/2013    Years since quitting: 4.2  . Smokeless tobacco: Former Network engineer Use Topics  . Alcohol use: Yes    Alcohol/week: 21.0 standard drinks    Types: 21 Cans of beer per week     ALLERGIES:        Allergies  Allergen Reactions  . Cetuximab Anaphylaxis    Cardiac arrest 08/02/2014     CURRENT MEDICATIONS:         Current Outpatient Medications  Medication Sig Dispense Refill  . ALPRAZolam (XANAX) 0.5 MG tablet Take 0.5 mg by mouth 3 (three) times daily.     Marland Kitchen aspirin 81 MG EC tablet Take 1 tablet (81 mg total) by mouth daily. 30 tablet 12  . atorvastatin (LIPITOR) 80 MG tablet TAKE 1 TABLET(80 MG) BY MOUTH DAILY 90 tablet 1  . buPROPion  (WELLBUTRIN) 100 MG tablet Take 100 mg by mouth 3 (three) times daily.     . carvedilol (COREG) 6.25 MG tablet TAKE 1 TABLET(6.25 MG) BY MOUTH TWICE DAILY WITH A MEAL 60 tablet 0  . ranitidine (ZANTAC) 75 MG tablet Take 25 mg by mouth.     No current facility-administered medications for this visit.     REVIEW OF SYSTEMS:   [X]  denotes positive finding, [ ]  denotes negative finding Cardiac  Comments:  Chest pain or chest pressure:    Shortness of breath upon exertion:    Short of breath when lying flat:    Irregular heart rhythm:        Vascular    Pain in calf, thigh, or hip brought on by ambulation:    Pain in feet at night that wakes you up from your sleep:     Blood clot in your veins:    Leg swelling:         Pulmonary    Oxygen at home:    Productive cough:     Wheezing:         Neurologic    Sudden weakness in arms or legs:     Sudden numbness in arms or legs:     Sudden onset of difficulty speaking or slurred speech:    Temporary loss of vision in one eye:     Problems with dizziness:         Gastrointestinal    Blood in stool:     Vomited blood:         Genitourinary    Burning when urinating:     Blood in urine:        Psychiatric    Major depression:         Hematologic    Bleeding problems:    Problems with blood clotting too easily:        Skin    Rashes or ulcers:        Constitutional    Fever or chills:      PHYSICAL EXAM:   There were no vitals filed for this visit.  GENERAL: The patient is a well-nourished male, in no acute distress. The vital signs are documented above. CARDIAC: There is a regular rate and rhythm.  PULMONARY: Non-labored respirations MUSCULOSKELETAL: There are no major deformities or cyanosis. NEUROLOGIC: No focal weakness or paresthesias are detected. SKIN: There are no ulcers or rashes noted. PSYCHIATRIC:  The patient has a normal affect.  STUDIES:   I have reviewed his vascular lab studies with the following findings: Right carotid: Velocities are consistent with  a 80-99% stenosis however no hemodynamically significant stenosis was identified.  Left carotid artery is known to be occluded.  MEDICAL ISSUES:   Carotid stenosis: Ultrasound today suggest that his stenosis has progressed to greater than 80%.  Because he has a known carotid occlusion on the left, I think this needs to be addressed.  I am starting him back on Plavix.  He is currently taking a baby aspirin.  He will be scheduled for right carotid angiography and possible stenting in September.    Annamarie Major, MD Vascular and Vein Specialists of [facility] [phone] Pager [phone]   No neurological symptoms since last visit PE: CV:RRR Pulm:CTA Neuro Intact  Plan carotid angio and possible stent.  All questions answered.  WElls Fernando Stoiber

## 2017-10-01 NOTE — Discharge Instructions (Signed)
Femoral Site Care Refer to this sheet in the next few weeks. These instructions provide you with information about caring for yourself after your procedure. Your health care provider may also give you more specific instructions. Your treatment has been planned according to current medical practices, but problems sometimes occur. Call your health care provider if you have any problems or questions after your procedure. What can I expect after the procedure? After your procedure, it is typical to have the following:  Bruising at the site that usually fades within 1-2 weeks.  Blood collecting in the tissue (hematoma) that may be painful to the touch. It should usually decrease in size and tenderness within 1-2 weeks.  Follow these instructions at home:  Take medicines only as directed by your health care provider.  You may shower 24-48 hours after the procedure or as directed by your health care provider. Remove the bandage (dressing) and gently wash the site with plain soap and water. Pat the area dry with a clean towel. Do not rub the site, because this may cause bleeding.  Do not take baths, swim, or use a hot tub until your health care provider approves.  Check your insertion site every day for redness, swelling, or drainage.  Do not apply powder or lotion to the site.  Limit use of stairs to twice a day for the first 2-3 days or as directed by your health care provider.  Do not squat for the first 2-3 days or as directed by your health care provider.  Do not lift over 10 lb (4.5 kg) for 5 days after your procedure or as directed by your health care provider.  Ask your health care provider when it is okay to: ? Return to work or school. ? Resume usual physical activities or sports. ? Resume sexual activity.  Do not drive home if you are discharged the same day as the procedure. Have someone else drive you.  You may drive 24 hours after the procedure unless otherwise instructed by  your health care provider.  Do not operate machinery or power tools for 24 hours after the procedure or as directed by your health care provider.  If your procedure was done as an outpatient procedure, which means that you went home the same day as your procedure, a responsible adult should be with you for the first 24 hours after you arrive home.  Keep all follow-up visits as directed by your health care provider. This is important. Contact a health care provider if:  You have a fever.  You have chills.  You have increased bleeding from the site. Hold pressure on the site. Get help right away if:  You have unusual pain at the site.  You have redness, warmth, or swelling at the site.  You have drainage (other than a small amount of blood on the dressing) from the site.  The site is bleeding, and the bleeding does not stop after 30 minutes of holding steady pressure on the site.  Your leg or foot becomes pale, cool, tingly, or numb. This information is not intended to replace advice given to you by your health care provider. Make sure you discuss any questions you have with your health care provider. Document Released: 09/02/2013 Document Revised: 06/07/2015 Document Reviewed: 07/19/2013 Elsevier Interactive Patient Education  2018 Refugio sitio femoral (Femoral Site Care) Siga estas instrucciones durante las prximas semanas. Estas indicaciones le proporcionan informacin acerca de cmo deber cuidarse despus del procedimiento. El  mdico tambin podr darle instrucciones ms especficas. El tratamiento ha sido planificado segn las prcticas mdicas actuales, pero en algunos casos pueden ocurrir problemas. Comunquese con el mdico si tiene algn problema o tiene dudas despus del procedimiento. QU ESPERAR DESPUS DEL PROCEDIMIENTO Despus del procedimiento, es normal tener lo siguiente:  Hematomas en el sitio que suelen desaparecer en el trmino de 1 o  2semanas.  Acumulacin de sangre en el tejido (hematoma) que puede ser dolorosa al tacto. Generalmente, en 1 o 2 semanas deben disminuir su tamao y el dolor a la palpacin. [place] los medicamentos solamente como se lo haya indicado el mdico.  Puede ducharse 24a 48horas despus del procedimiento o como se lo haya indicado el mdico. Retire el vendaje (apsito) y limpie suavemente el lugar de la insercin con agua y jabn comn. Seque bien el rea con una toalla limpia dando golpecitos. No frote el lugar, ya que Therapist, art.  No tome baos de inmersin, no nade ni use el jacuzzi hasta que el mdico lo autorice.  Revise diariamente el lugar de la insercin para ver si aparece enrojecimiento, hinchazn o secrecin.  No se aplique talcos ni lociones en el lugar.  Limite el uso de las escaleras a dos veces por da [employer] primeros 2 o 3das o como se lo haya indicado el mdico.  No se ponga en cuclillas durante los primeros 2 o 3das o como se lo haya indicado el mdico.  No levante objetos que pesen ms de 10libras (4,5kg) durante los 5das posteriores al procedimiento o como se lo haya indicado el mdico.  Pregntele al mdico cundo puede hacer lo siguiente: ? Regresar a la escuela o al [employer]. ? Reanudar las actividades fsicas o los deportes que practica habitualmente. ? Reanudar la actividad sexual.  No conduzca el automvil por sus propios medios si le dan el alta el mismo da del procedimiento. Pdale a otra persona que lo lleve.  Puede conducir 24horas despus del procedimiento, a menos que el mdico le haya indicado lo contrario.  No opere maquinaria ni herramientas elctricas durante 24horas despus del procedimiento o como lo haya indicado el mdico.  Si el procedimiento se hizo de IT trainer, lo que significa que regres a su casa el mismo da de su realizacin, un adulto responsable debe acompaarlo durante  las primeras 24horas.  Concurra a todas las visitas de control como se lo haya indicado el mdico. Esto es importante. SOLICITE ATENCIN MDICA SI:  Jaclynn Guarneri.  Tiene escalofros.  Aumenta el sangrado en el sitio. Haga presin Mudlogger. SOLICITE ATENCIN MDICA DE INMEDIATO SI:  Tiene un dolor que no es habitual en el sitio.  Observa que el sitio est enrojecida, caliente o hinchada.  Tiene secrecin (que no es una pequea cantidad de sangre en el vendaje) en el sitio.  El sitio est sangrando, y el sangrado no se detiene despus de 44minutos de Oceanographer una presin constante.  La pierna o el pie se le ponen plidos, fros o siente hormigueo o adormecimiento. Esta informacin no tiene Marine scientist el consejo del mdico. Asegrese de hacerle al mdico cualquier pregunta que tenga. Document Released: 10/14/2013 Document Revised: 04/23/2015 Document Reviewed: 07/19/2013 Elsevier Interactive Patient Education  2018 [employer].

## 2017-12-24 ENCOUNTER — Other Ambulatory Visit: Payer: Self-pay | Admitting: Cardiovascular Disease

## 2017-12-25 NOTE — Telephone Encounter (Signed)
Rx request sent to pharmacy.  

## 2018-02-26 ENCOUNTER — Ambulatory Visit: Payer: Self-pay | Admitting: Cardiovascular Disease

## 2018-03-01 ENCOUNTER — Ambulatory Visit: Payer: Self-pay | Attending: Radiation Oncology | Admitting: Radiation Oncology

## 2018-04-15 ENCOUNTER — Telehealth: Payer: Self-pay

## 2018-04-15 NOTE — Telephone Encounter (Signed)
Virtual Visit Pre-Appointment Phone Call  Steps For Call: Verbal consent by phone call  1. Confirm consent - "In the setting of the current Covid19 crisis, you are scheduled for a (phone or video) visit with your provider on (date) at (time).  Just as we do with many in-office visits, in order for you to participate in this visit, we must obtain consent.  If you'd like, I can send this to your mychart (if signed up) or email for you to review.  Otherwise, I can obtain your verbal consent now.  All virtual visits are billed to your insurance company just like a normal visit would be.  By agreeing to a virtual visit, we'd like you to understand that the technology does not allow for your provider to perform an examination, and thus may limit your provider's ability to fully assess your condition.  Finally, though the technology is pretty good, we cannot assure that it will always work on either your or our end, and in the setting of a video visit, we may have to convert it to a phone-only visit.  In either situation, we cannot ensure that we have a secure connection.  Are you willing to proceed?"  2. Give patient instructions for WebEx download to smartphone as below if video visit  3. Advise patient to be prepared with any vital sign or heart rhythm information, their current medicines, and a piece of paper and pen handy for any instructions they may receive the day of their visit  4. Inform patient they will receive a phone call 15 minutes prior to their appointment time (may be from unknown caller ID) so they should be prepared to answer  5. Confirm that appointment type is correct in Epic appointment notes (video vs telephone)    TELEPHONE CALL NOTE  [name] has been deemed a candidate for a follow-up tele-health visit to limit community exposure during the Covid-19 pandemic. I spoke with the patient via phone to ensure availability of phone/video source, confirm preferred email &  phone number, and discuss instructions and expectations.  I reminded [name] to be prepared with any vital sign and/or heart rhythm information that could potentially be obtained via home monitoring, at the time of his visit. I reminded [name] to expect a phone call at the time of his visit if his visit.  Did the patient verbally acknowledge consent to treatment?  [place] 04/15/2018 12:12 PM   DOWNLOADING THE [employer]  - If Apple, go to [employer] and type in WebEx in the search bar. [place] [employer], the blue/Bunnie Rehberg circle. The app is free but as with any other app downloads, their phone may require them to verify saved payment information or Apple password. The patient does NOT have to create an account.  - If Android, ask patient to go to [employer] and type in WebEx in the search bar. [place] [employer], the blue/Mingo Siegert circle. The app is free but as with any other app downloads, their phone may require them to verify saved payment information or Android password. The patient does NOT have to create an account.   CONSENT FOR TELE-HEALTH VISIT - PLEASE REVIEW  I hereby voluntarily request, consent and authorize [facility] and its employed or contracted physicians, physician assistants, nurse practitioners or other licensed health care professionals (the Practitioner), to provide me with telemedicine health care services (the "Services") as deemed necessary  by the treating Practitioner. I acknowledge and consent to receive the Services by the Practitioner via telemedicine. I understand that the telemedicine visit will involve communicating with the Practitioner through live audiovisual communication technology and the disclosure of certain medical information by electronic transmission. I acknowledge that I have been given the opportunity to request an in-person assessment or other available alternative prior to the  telemedicine visit and am voluntarily participating in the telemedicine visit.  I understand that I have the right to withhold or withdraw my consent to the use of telemedicine in the course of my care at any time, without affecting my right to future care or treatment, and that the Practitioner or I may terminate the telemedicine visit at any time. I understand that I have the right to inspect all information obtained and/or recorded in the course of the telemedicine visit and may receive copies of available information for a reasonable fee.  I understand that some of the potential risks of receiving the Services via telemedicine include:  Marland Kitchen Delay or interruption in medical evaluation due to technological equipment failure or disruption; . Information transmitted may not be sufficient (e.g. poor resolution of images) to allow for appropriate medical decision making by the Practitioner; and/or  . In rare instances, security protocols could fail, causing a breach of personal health information.  Furthermore, I acknowledge that it is my responsibility to provide information about my medical history, conditions and care that is complete and accurate to the best of my ability. I acknowledge that Practitioner's advice, recommendations, and/or decision may be based on factors not within their control, such as incomplete or inaccurate data provided by me or distortions of diagnostic images or specimens that may result from electronic transmissions. I understand that the practice of medicine is not an exact science and that Practitioner makes no warranties or guarantees regarding treatment outcomes. I acknowledge that I will receive a copy of this consent concurrently upon execution via email to the email address I last provided but may also request a printed copy by calling the office of [name].    I understand that my insurance will be billed for this visit.   I have read or had this consent read to me.  . I understand the contents of this consent, which adequately explains the benefits and risks of the Services being provided via telemedicine.  . I have been provided ample opportunity to ask questions regarding this consent and the Services and have had my questions answered to my satisfaction. . I give my informed consent for the services to be provided through the use of telemedicine in my medical care  By participating in this telemedicine visit I agree to the above.

## 2018-04-20 ENCOUNTER — Telehealth: Payer: Self-pay | Admitting: Cardiovascular Disease

## 2018-04-20 ENCOUNTER — Other Ambulatory Visit: Payer: Self-pay

## 2018-04-20 ENCOUNTER — Encounter

## 2018-04-20 ENCOUNTER — Telehealth: Payer: Self-pay | Admitting: *Deleted

## 2018-04-20 NOTE — Telephone Encounter (Signed)
.  hccv

## 2018-04-20 NOTE — Telephone Encounter (Signed)
   Cardiac Questionnaire:    Since your last visit or hospitalization:    1. Have you been having new or worsening chest pain? NO   2. Have you been having new or worsening shortness of breath? NO 3. Have you been having new or worsening leg swelling, wt gain, or increase in abdominal girth (pants fitting more tightly)? NO   4. Have you had any passing out spells? NO    *A YES to any of these questions would result in the appointment being kept. *If all the answers to these questions are NO, we should indicate that given the current situation regarding the worldwide coronarvirus pandemic, at the recommendation of the CDC, we are looking to limit gatherings in our waiting area, and thus will reschedule their appointment beyond four weeks from today.   _____________   IFOYD-74 Pre-Screening Questions:  . Do you currently have a fever? NO Have you recently travelled on a cruise, internationally, or to [place], [place], [place], [place], [place], or [place], [place] [employer])? NO Have you been in contact with someone that is currently pending confirmation of Covid19 testing or has been confirmed to have the [place] virus? NO Are you currently experiencing fatigue or cough? NO     Due to this Covid19 crisis patient wanted to reschedule his appointment. He would like to keep the visit as a telephone visit.

## 2018-08-02 ENCOUNTER — Telehealth: Payer: Self-pay

## 2018-08-02 MED ORDER — ATORVASTATIN CALCIUM 80 MG PO TABS: 80.0000 mg | ORAL_TABLET | Freq: Every day | ORAL | 1 refills | Status: DC

## 2018-08-02 NOTE — Telephone Encounter (Signed)
Rx for Atorvastatin 80 mg sent to [facility].

## 2018-09-13 ENCOUNTER — Other Ambulatory Visit: Payer: Self-pay | Admitting: Surgery

## 2018-09-25 ENCOUNTER — Other Ambulatory Visit: Payer: Self-pay | Admitting: Cardiovascular Disease

## 2018-09-30 ENCOUNTER — Encounter: Payer: Self-pay | Admitting: Podiatry

## 2018-09-30 ENCOUNTER — Ambulatory Visit (INDEPENDENT_AMBULATORY_CARE_PROVIDER_SITE_OTHER): Payer: Self-pay | Admitting: Podiatry

## 2018-09-30 ENCOUNTER — Ambulatory Visit: Payer: Self-pay

## 2018-09-30 ENCOUNTER — Other Ambulatory Visit: Payer: Self-pay

## 2018-09-30 DIAGNOSIS — E785 Hyperlipidemia, unspecified: Secondary | ICD-10-CM | POA: Insufficient documentation

## 2018-09-30 DIAGNOSIS — M722 Plantar fascial fibromatosis: Secondary | ICD-10-CM | POA: Insufficient documentation

## 2018-09-30 DIAGNOSIS — F419 Anxiety disorder, unspecified: Secondary | ICD-10-CM | POA: Insufficient documentation

## 2018-09-30 DIAGNOSIS — I10 Essential (primary) hypertension: Secondary | ICD-10-CM | POA: Insufficient documentation

## 2018-09-30 DIAGNOSIS — M779 Enthesopathy, unspecified: Secondary | ICD-10-CM

## 2018-09-30 DIAGNOSIS — M79673 Pain in unspecified foot: Secondary | ICD-10-CM | POA: Insufficient documentation

## 2018-09-30 DIAGNOSIS — Z720 Tobacco use: Secondary | ICD-10-CM | POA: Insufficient documentation

## 2018-09-30 DIAGNOSIS — I771 Stricture of artery: Secondary | ICD-10-CM | POA: Insufficient documentation

## 2018-09-30 DIAGNOSIS — M79672 Pain in left foot: Secondary | ICD-10-CM

## 2018-09-30 NOTE — Progress Notes (Signed)
Subjective:   Patient ID: [name], male   DOB: 63 y.o.   MRN: [id number]   HPI Patient presents stating that he has quite a bit of discomfort in the left foot in the joint and states he is been given a tentative diagnosis of having bad circulation.  Patient states it is been present for several months   Review of Systems  All other systems reviewed and are negative.       Objective:  Physical Exam Vitals signs and nursing note reviewed.  Constitutional:      Appearance: He is well-developed.  Pulmonary:     Effort: Pulmonary effort is normal.  Musculoskeletal: Normal range of motion.  Skin:    General: Skin is warm.  Neurological:     Mental Status: He is alert.     Neurovascular status intact muscle strength found to be adequate range of motion within normal limits with patient noted to have inflammation pain of the second MPJ left with fluid buildup.  I did note good digital perfusion and patient is well oriented     Assessment:  Inflammatory capsulitis second MPJ left with inflammation fluid buildup     Plan:  H&P conditions reviewed and I did do a sterile prep of the area aspirated the joint getting out a small amount of clear fluid and injected 1/4 cc dexamethasone Kenalog and applied thick padding to reduce pressure on the joint surface.  Advised on rigid bottom shoes and reappoint for [place] to recheck as needed     c

## 2018-10-21 ENCOUNTER — Ambulatory Visit (INDEPENDENT_AMBULATORY_CARE_PROVIDER_SITE_OTHER): Payer: Self-pay | Admitting: Podiatry

## 2018-10-21 ENCOUNTER — Other Ambulatory Visit: Payer: Self-pay

## 2018-10-21 DIAGNOSIS — M779 Enthesopathy, unspecified: Secondary | ICD-10-CM

## 2018-10-22 ENCOUNTER — Other Ambulatory Visit: Payer: Self-pay | Admitting: Cardiovascular Disease

## 2018-10-22 NOTE — Telephone Encounter (Signed)
Pt overdue for 12 month f/u. °Please contact pt for future appointment. °

## 2018-10-22 NOTE — Telephone Encounter (Signed)
Please see note below. 

## 2018-10-25 NOTE — Progress Notes (Signed)
Subjective:   Patient ID: [name], male   DOB: 63 y.o.   MRN: [id number]   HPI Patient presents stating he still having pain in his forefoot is not sure if it might not be the joint next to the last 1   ROS      Objective:  Physical Exam  Neurovascular status intact with third MPJ which was worked on doing better but the second MPJ is inflamed and painful     Assessment:  Appears to be improving therapy but does have discomfort more medial than where it was previously     Plan:  Anesthetized 60 mg like Marcaine mixture in the second MPJ was aspirated with clear fluid and 1/4 cc dexamethasone Kenalog administered and reappoint 4 weeks to see results and decide if anything else may be appropriate

## 2018-11-19 ENCOUNTER — Other Ambulatory Visit: Payer: Self-pay | Admitting: Cardiovascular Disease

## 2018-12-19 ENCOUNTER — Other Ambulatory Visit: Payer: Self-pay | Admitting: Cardiovascular Disease

## 2018-12-31 ENCOUNTER — Ambulatory Visit: Payer: Self-pay | Admitting: Cardiovascular Disease

## 2019-02-22 ENCOUNTER — Other Ambulatory Visit: Payer: Self-pay

## 2019-02-22 ENCOUNTER — Ambulatory Visit: Payer: Self-pay | Admitting: Cardiovascular Disease

## 2019-02-22 MED ORDER — ATORVASTATIN CALCIUM 80 MG PO TABS: ORAL_TABLET | ORAL | 1 refills | Status: DC

## 2019-03-09 ENCOUNTER — Inpatient Hospital Stay (HOSPITAL_COMMUNITY)
Admission: EM | Admit: 2019-03-09 | Discharge: 2019-03-12 | DRG: 377 | Disposition: A | Discharge status: Home / Self Care | Payer: Medicare Other | Attending: Internal Medicine | Admitting: Internal Medicine

## 2019-03-09 ENCOUNTER — Ambulatory Visit: Payer: Self-pay | Admitting: Cardiovascular Disease

## 2019-03-09 ENCOUNTER — Encounter (HOSPITAL_COMMUNITY): Payer: Self-pay

## 2019-03-09 ENCOUNTER — Emergency Department (HOSPITAL_COMMUNITY): Payer: Medicare Other

## 2019-03-09 ENCOUNTER — Other Ambulatory Visit: Payer: Self-pay

## 2019-03-09 DIAGNOSIS — R7401 Elevation of levels of liver transaminase levels: Secondary | ICD-10-CM

## 2019-03-09 DIAGNOSIS — G893 Neoplasm related pain (acute) (chronic): Secondary | ICD-10-CM | POA: Diagnosis present

## 2019-03-09 DIAGNOSIS — F419 Anxiety disorder, unspecified: Secondary | ICD-10-CM | POA: Diagnosis present

## 2019-03-09 DIAGNOSIS — Z9049 Acquired absence of other specified parts of digestive tract: Secondary | ICD-10-CM

## 2019-03-09 DIAGNOSIS — I739 Peripheral vascular disease, unspecified: Secondary | ICD-10-CM | POA: Diagnosis present

## 2019-03-09 DIAGNOSIS — I6522 Occlusion and stenosis of left carotid artery: Secondary | ICD-10-CM | POA: Diagnosis present

## 2019-03-09 DIAGNOSIS — K922 Gastrointestinal hemorrhage, unspecified: Secondary | ICD-10-CM | POA: Diagnosis present

## 2019-03-09 DIAGNOSIS — Z923 Personal history of irradiation: Secondary | ICD-10-CM | POA: Diagnosis not present

## 2019-03-09 DIAGNOSIS — E871 Hypo-osmolality and hyponatremia: Secondary | ICD-10-CM | POA: Diagnosis present

## 2019-03-09 DIAGNOSIS — I5042 Chronic combined systolic (congestive) and diastolic (congestive) heart failure: Secondary | ICD-10-CM | POA: Diagnosis present

## 2019-03-09 DIAGNOSIS — I10 Essential (primary) hypertension: Secondary | ICD-10-CM

## 2019-03-09 DIAGNOSIS — E785 Hyperlipidemia, unspecified: Secondary | ICD-10-CM | POA: Diagnosis present

## 2019-03-09 DIAGNOSIS — Z20822 Contact with and (suspected) exposure to covid-19: Secondary | ICD-10-CM | POA: Diagnosis present

## 2019-03-09 DIAGNOSIS — I25810 Atherosclerosis of coronary artery bypass graft(s) without angina pectoris: Secondary | ICD-10-CM | POA: Diagnosis present

## 2019-03-09 DIAGNOSIS — Z6823 Body mass index (BMI) 23.0-23.9, adult: Secondary | ICD-10-CM | POA: Diagnosis not present

## 2019-03-09 DIAGNOSIS — I2510 Atherosclerotic heart disease of native coronary artery without angina pectoris: Secondary | ICD-10-CM | POA: Diagnosis present

## 2019-03-09 DIAGNOSIS — Z823 Family history of stroke: Secondary | ICD-10-CM

## 2019-03-09 DIAGNOSIS — D630 Anemia in neoplastic disease: Secondary | ICD-10-CM | POA: Diagnosis present

## 2019-03-09 DIAGNOSIS — D6489 Other specified anemias: Secondary | ICD-10-CM | POA: Diagnosis present

## 2019-03-09 DIAGNOSIS — C787 Secondary malignant neoplasm of liver and intrahepatic bile duct: Secondary | ICD-10-CM | POA: Diagnosis present

## 2019-03-09 DIAGNOSIS — Z7982 Long term (current) use of aspirin: Secondary | ICD-10-CM

## 2019-03-09 DIAGNOSIS — C329 Malignant neoplasm of larynx, unspecified: Secondary | ICD-10-CM | POA: Diagnosis present

## 2019-03-09 DIAGNOSIS — E43 Unspecified severe protein-calorie malnutrition: Secondary | ICD-10-CM | POA: Diagnosis present

## 2019-03-09 DIAGNOSIS — I255 Ischemic cardiomyopathy: Secondary | ICD-10-CM | POA: Diagnosis present

## 2019-03-09 DIAGNOSIS — K219 Gastro-esophageal reflux disease without esophagitis: Secondary | ICD-10-CM | POA: Diagnosis present

## 2019-03-09 DIAGNOSIS — K222 Esophageal obstruction: Secondary | ICD-10-CM | POA: Diagnosis present

## 2019-03-09 DIAGNOSIS — Z8249 Family history of ischemic heart disease and other diseases of the circulatory system: Secondary | ICD-10-CM

## 2019-03-09 DIAGNOSIS — K31811 Angiodysplasia of stomach and duodenum with bleeding: Secondary | ICD-10-CM | POA: Diagnosis present

## 2019-03-09 DIAGNOSIS — Z7902 Long term (current) use of antithrombotics/antiplatelets: Secondary | ICD-10-CM | POA: Diagnosis not present

## 2019-03-09 DIAGNOSIS — E46 Unspecified protein-calorie malnutrition: Secondary | ICD-10-CM | POA: Diagnosis present

## 2019-03-09 DIAGNOSIS — I252 Old myocardial infarction: Secondary | ICD-10-CM

## 2019-03-09 DIAGNOSIS — Z8674 Personal history of sudden cardiac arrest: Secondary | ICD-10-CM

## 2019-03-09 DIAGNOSIS — I110 Hypertensive heart disease with heart failure: Secondary | ICD-10-CM | POA: Diagnosis present

## 2019-03-09 DIAGNOSIS — Z720 Tobacco use: Secondary | ICD-10-CM

## 2019-03-09 DIAGNOSIS — R7989 Other specified abnormal findings of blood chemistry: Secondary | ICD-10-CM

## 2019-03-09 LAB — HEPATITIS PANEL, ACUTE
HCV Ab: NONREACTIVE
Hep A IgM: NONREACTIVE
Hep B C IgM: NONREACTIVE
Hepatitis B Surface Ag: NONREACTIVE

## 2019-03-09 LAB — COMPREHENSIVE METABOLIC PANEL
ALT: 205 U/L — ABNORMAL HIGH (ref 0–44)
AST: 453 U/L — ABNORMAL HIGH (ref 15–41)
Albumin: 2.6 g/dL — ABNORMAL LOW (ref 3.5–5.0)
Alkaline Phosphatase: 802 U/L — ABNORMAL HIGH (ref 38–126)
Anion gap: 16 — ABNORMAL HIGH (ref 5–15)
BUN: 11 mg/dL (ref 8–23)
CO2: 24 mmol/L (ref 22–32)
Calcium: 8.6 mg/dL — ABNORMAL LOW (ref 8.9–10.3)
Chloride: 91 mmol/L — ABNORMAL LOW (ref 98–111)
Creatinine, Ser: 1.17 mg/dL (ref 0.61–1.24)
GFR calc Af Amer: >60 mL/min (ref >60)
GFR calc non Af Amer: >60 mL/min (ref >60)
Glucose, Bld: 97 mg/dL (ref 70–99)
Potassium: 4.8 mmol/L (ref 3.5–5.1)
Sodium: 131 mmol/L — ABNORMAL LOW (ref 135–145)
Total Bilirubin: 2 mg/dL — ABNORMAL HIGH (ref 0.3–1.2)
Total Protein: 5.8 g/dL — ABNORMAL LOW (ref 6.5–8.1)

## 2019-03-09 LAB — CBC
HCT: 33.8 % — ABNORMAL LOW (ref 39.0–52.0)
HCT: 30.6 % — ABNORMAL LOW (ref 39.0–52.0)
Hemoglobin: 11.3 g/dL — ABNORMAL LOW (ref 13.0–17.0)
Hemoglobin: 10.4 g/dL — ABNORMAL LOW (ref 13.0–17.0)
MCH: 30.7 pg (ref 26.0–34.0)
MCH: 30.7 pg (ref 26.0–34.0)
MCHC: 33.4 g/dL (ref 30.0–36.0)
MCHC: 34 g/dL (ref 30.0–36.0)
MCV: 91.8 fL (ref 80.0–100.0)
MCV: 90.3 fL (ref 80.0–100.0)
Platelets: 321 10*3/uL (ref 150–400)
Platelets: 285 10*3/uL (ref 150–400)
RBC: 3.68 MIL/uL — ABNORMAL LOW (ref 4.22–5.81)
RBC: 3.39 MIL/uL — ABNORMAL LOW (ref 4.22–5.81)
RDW: 16.3 % — ABNORMAL HIGH (ref 11.5–15.5)
RDW: 16.2 % — ABNORMAL HIGH (ref 11.5–15.5)
WBC: 12.6 10*3/uL — ABNORMAL HIGH (ref 4.0–10.5)
WBC: 12.7 10*3/uL — ABNORMAL HIGH (ref 4.0–10.5)
nRBC: 0 % (ref 0.0–0.2)
nRBC: 0 % (ref 0.0–0.2)

## 2019-03-09 LAB — TYPE AND SCREEN
ABO/RH(D): O POS
Antibody Screen: NEGATIVE

## 2019-03-09 LAB — PROTIME-INR
INR: 1.2 (ref 0.8–1.2)
Prothrombin Time: 15.2 seconds (ref 11.4–15.2)

## 2019-03-09 LAB — ACETAMINOPHEN LEVEL: Acetaminophen (Tylenol), Serum: <10 ug/mL — ABNORMAL LOW (ref 10–30)

## 2019-03-09 LAB — SARS CORONAVIRUS 2 (TAT 6-24 HRS): SARS Coronavirus 2: NEGATIVE

## 2019-03-09 LAB — POC OCCULT BLOOD, ED: Fecal Occult Bld: POSITIVE — AB

## 2019-03-09 MED ORDER — PANTOPRAZOLE SODIUM 40 MG IV SOLR
40.0000 mg | Freq: Once | INTRAVENOUS | Status: AC
  Administered 2019-03-09: 40 mg via INTRAVENOUS
  Filled 2019-03-09: qty 40

## 2019-03-09 MED ORDER — ONDANSETRON HCL 4 MG/2ML IJ SOLN: 4.0000 mg | Freq: Four times a day (QID) | INTRAMUSCULAR | Status: DC | PRN

## 2019-03-09 MED ORDER — BUPROPION HCL 100 MG PO TABS
100.0000 mg | ORAL_TABLET | Freq: Three times a day (TID) | ORAL | Status: DC
  Administered 2019-03-10 – 2019-03-12 (×8): 100 mg via ORAL
  Filled 2019-03-09 (×11): qty 1

## 2019-03-09 MED ORDER — ATORVASTATIN CALCIUM 80 MG PO TABS
80.0000 mg | ORAL_TABLET | Freq: Every day | ORAL | Status: DC
  Administered 2019-03-10 – 2019-03-12 (×3): 80 mg via ORAL
  Filled 2019-03-09 (×3): qty 1

## 2019-03-09 MED ORDER — SODIUM CHLORIDE 0.9 % IV SOLN: INTRAVENOUS | Status: AC

## 2019-03-09 MED ORDER — HYDROCODONE-ACETAMINOPHEN 5-325 MG PO TABS
1.0000 | ORAL_TABLET | ORAL | Status: DC | PRN
  Administered 2019-03-09: 1 via ORAL
  Administered 2019-03-10: 2 via ORAL
  Administered 2019-03-11: 1 via ORAL
  Administered 2019-03-12: 2 via ORAL
  Filled 2019-03-09 (×2): qty 2
  Filled 2019-03-09 (×2): qty 1

## 2019-03-09 MED ORDER — PANTOPRAZOLE SODIUM 40 MG IV SOLR
40.0000 mg | Freq: Once | INTRAVENOUS | Status: AC
  Administered 2019-03-09: 22:00:00 40 mg via INTRAVENOUS
  Filled 2019-03-09: qty 40

## 2019-03-09 MED ORDER — MORPHINE SULFATE (PF) 4 MG/ML IV SOLN
4.0000 mg | Freq: Once | INTRAVENOUS | Status: AC
  Administered 2019-03-09: 4 mg via INTRAVENOUS
  Filled 2019-03-09: qty 1

## 2019-03-09 MED ORDER — ONDANSETRON HCL 4 MG PO TABS: 4.0000 mg | ORAL_TABLET | Freq: Four times a day (QID) | ORAL | Status: DC | PRN

## 2019-03-09 MED ORDER — SODIUM CHLORIDE 0.9 % IV BOLUS
1000.0000 mL | Freq: Once | INTRAVENOUS | Status: AC
  Administered 2019-03-09: 13:00:00 1000 mL via INTRAVENOUS

## 2019-03-09 MED ORDER — ACETAMINOPHEN 650 MG RE SUPP: 650.0000 mg | Freq: Four times a day (QID) | RECTAL | Status: DC | PRN

## 2019-03-09 MED ORDER — ALPRAZOLAM 0.5 MG PO TABS
0.5000 mg | ORAL_TABLET | Freq: Two times a day (BID) | ORAL | Status: DC
  Administered 2019-03-09 – 2019-03-12 (×6): 0.5 mg via ORAL
  Filled 2019-03-09 (×6): qty 1

## 2019-03-09 MED ORDER — THIAMINE HCL 100 MG/ML IJ SOLN
100.0000 mg | Freq: Every day | INTRAMUSCULAR | Status: DC
  Administered 2019-03-09 – 2019-03-12 (×4): 100 mg via INTRAVENOUS
  Filled 2019-03-09 (×4): qty 2

## 2019-03-09 MED ORDER — IOHEXOL 300 MG/ML  SOLN
100.0000 mL | Freq: Once | INTRAMUSCULAR | Status: AC | PRN
  Administered 2019-03-09: 17:00:00 100 mL via INTRAVENOUS

## 2019-03-09 MED ORDER — ACETAMINOPHEN 325 MG PO TABS: 650.0000 mg | ORAL_TABLET | Freq: Four times a day (QID) | ORAL | Status: DC | PRN

## 2019-03-09 MED ORDER — ONDANSETRON HCL 4 MG/2ML IJ SOLN
4.0000 mg | Freq: Once | INTRAMUSCULAR | Status: AC
  Administered 2019-03-09: 13:00:00 4 mg via INTRAVENOUS
  Filled 2019-03-09: qty 2

## 2019-03-09 MED ORDER — ADULT MULTIVITAMIN W/MINERALS CH
1.0000 | ORAL_TABLET | Freq: Every day | ORAL | Status: DC
  Administered 2019-03-09 – 2019-03-12 (×4): 1 via ORAL
  Filled 2019-03-09 (×4): qty 1

## 2019-03-09 MED ORDER — SODIUM CHLORIDE 0.9 % IV SOLN
8.0000 mg/h | INTRAVENOUS | Status: DC
  Administered 2019-03-09 – 2019-03-12 (×7): 8 mg/h via INTRAVENOUS
  Filled 2019-03-09 (×10): qty 80

## 2019-03-09 MED ORDER — PANTOPRAZOLE SODIUM 40 MG IV SOLR: 40.0000 mg | Freq: Two times a day (BID) | INTRAVENOUS | Status: DC

## 2019-03-09 NOTE — ED Notes (Signed)
Pt went to c-t 

## 2019-03-09 NOTE — ED Triage Notes (Addendum)
Pt reports right sided flank pain 2 weeks ago, pt seen at UC at the time and was told he was constipated. Pt given generac to take. Pt then noticed black stool last week. Pt also reports some urinary retention with abd swelling. Yesterday pt had multiple loose black stools with vomiting "jet black" colored emesis. Pt taking plavix. Pt arrives alert and oriented, no active vomiting at this time.

## 2019-03-09 NOTE — ED Notes (Signed)
Pt o2 dropping into the 70's up sleeping. Pt laced on 2L o2.

## 2019-03-09 NOTE — ED Provider Notes (Signed)
[facility] EMERGENCY DEPARTMENT Provider Note   CSN: [id number] Arrival date & time: 03/09/19  1040     History Chief Complaint  Patient presents with  . GI Bleeding  . Abdominal Pain    [name] is a 64 y.o. male.  The history is provided by the patient and medical records. No language interpreter was used.  Abdominal Pain    64 year old male with significant history of cardiac disease, alcohol abuse, GERD, depression, currently on Plavix, presenting for evaluation of abdominal pain.  Patient report 2 weeks ago he developed pain to the right side of his abdomen.  Pain is sharp stabbing.  He went to urgent care and states that an x-ray was obtained subsequently patient was told that he is very constipated.  Felt he was not constipated and that he has had normal bowel movement daily.  However he was giving a laxative and was told to drink plenty of fluid as well as taking a laxative.  Despite follow instruction, his pain persist.  He report having decrease in appetite but able to eat and drink.  States he drinks plenty of fluid without improvement.  Yesterday he had a large bowel movement and with dark black stools.  As well as vomiting up jet black-colored emesis.  He also endorsed feeling weak and lightheadedness.  Pain is still as a 5 out of 10.  No associated fever chills no chest pain shortness of breath productive cough no dysuria.  He denies NSAID use.  Denies alcohol use.  He is still on Plavix for his multiple stents.  Past Medical History:  Diagnosis Date  . Acute encephalopathy, improved 06/20/2013  . Acute MI, troponin > 20, no obvious culprit vessel 06/20/2013  . Anxiety   . Arthritis   . At risk for sudden cardiac death, has lifevest at discharge 06/20/2013  . Carotid stenosis   . Coronary artery disease    s/p CABG 2006; stenting to vein graft 05-2013; NSTEMI 06-2013 in setting of PEA arrest  . Depression   . Difficulty swallowing solids    Pt  unable to swallow solid foods  . Dizziness 06/20/2013  . Dyslipidemia 10/09/2013   [place] Study atorvastatin -eIRB # [id number] tablet Take 40 mg by mouth daily.   . Fever, possible aspiration-treated with antibiotics 06/20/2013  . GERD (gastroesophageal reflux disease)   . Heart failure, acute on chronic, systolic and diastolic ([place]) 37/04/8887  . Heart murmur   . History of radiation therapy 09/05/14-10/23/14   larynx 70 Gy  . Hyperlipidemia   . Hypertension   . Hypothermia, induced, post arrest 06/20/2013  . Incisional hernia   . Ischemic cardiomyopathy    echo 06/2013 EF 10-15% (normal 01/2013)  . PAD (peripheral artery disease) ([place]) 10/09/2013   Occluded left common carotid and moderate right internal carotid artery stenosis. Previous stent to the left subclavian artery. Right iliac stent. Bilateral 60-99% renal artery stenosis    . Radiation-induced esophageal stricture   . Shock liver 06/20/2013  . Shortness of breath dyspnea    with exertion  . 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 Subclavian artery stenosis, left (HCC)    s/p stenting 2006  . Tobacco abuse    >100 pack year history   . Weakness due to cardiac arrest 06/20/2013  . Wears glasses     Patient Active Problem List   Diagnosis Date Noted  . Anxiety 09/30/2018  . Foot pain 09/30/2018  . HLD (hyperlipidemia)  09/30/2018  . Hypertension 09/30/2018  . Plantar fasciitis 09/30/2018  . Subclavian artery stenosis, left (HCC) 09/30/2018  . Tobacco abuse 09/30/2018  . Occlusion of left carotid artery 02/05/2017  . Coronary artery disease involving coronary bypass graft of native heart without angina pectoris 02/05/2017  . Carotid stenosis 07/10/2016  . CAD S/P percutaneous coronary angioplasty 07/07/2016  . Diverticulitis 06/17/2016  . S/P colostomy takedown 06/13/2015  . History of external beam radiation therapy 04/05/2015  . Aspiration into respiratory tract   . Protein-calorie malnutrition, severe (HCC) 10/13/2014    . Palliative care encounter 10/13/2014  . Pre-operative cardiovascular examination 10/13/2014  . Pain in throat 10/13/2014  . Increased oropharyngeal secretions   . Laryngeal cancer (HCC)   . Skin ulceration (HCC) 10/11/2014  . Fever in adult 10/11/2014  . Dysphagia 10/11/2014  . Infection, respiratory tract 10/10/2014  . Skin sore 10/10/2014  . Protein calorie malnutrition (HCC) 09/21/2014  . Hypotension due to drugs 09/13/2014  . Throat pain in adult 09/06/2014  . Other emphysema (HCC)   . Cardiac ischemia   . Perforated bowel (HCC)   . Alcohol abuse   . Peritonitis (HCC)   . Hypokalemia   . Hypomagnesemia   . Troponin level elevated   . History of small bowel obstruction 08/05/2014  . NSTEMI (non-ST elevated myocardial infarction) (HCC) 08/05/2014  . Drug reaction   . Hx of CABG x 1 '06   . Sore throat 08/01/2014  . Cancer associated pain 08/01/2014  . Squamous cell carcinoma of larynx (HCC) 07/14/2014  . Chronic combined systolic and diastolic heart failure (HCC) 12/26/2013  . Dyslipidemia 10/09/2013  . PAD (peripheral artery disease) (HCC) 10/09/2013  . At risk for sudden cardiac death, has lifevest at discharge 06/20/2013  . Ischemic cardiomyopathy, EF 45% 06/20/2013  . Shock liver 06/20/2013  . Acute MI, troponin > 20, no obvious culprit vessel, NSTEMI anterior wall 06/20/2013  . Hypothermia, induced, post arrest, initially then stopped 06/20/2013  . Acute encephalopathy, improved 06/20/2013  . Dizziness 06/20/2013  . Weakness due to cardiac arrest 06/20/2013  . Fever, possible aspiration-treated with antibiotics 06/20/2013  . Anemia in chronic illness 06/17/2013  . NSVT (nonsustained ventricular tachycardia) (HCC) 06/17/2013  . CAD-hx of CABG '06, DES LCX 01/2013, DES SVG-LAD 5/21/5 at [facility] 06/17/2013  . Acute respiratory failure with hypoxia, intubated on admit, self-extubatedn 06/13/13 06/10/2013  . Cardiogenic shock (HCC) 06/10/2013  . Cardiogenic pulmonary  edema, improved 06/10/2013  . History of cardiac arrest-2015 06/09/2013    Past Surgical History:  Procedure Laterality Date  . CARDIAC CATHETERIZATION    . CAROTID ANGIOGRAPHY  07/10/2016   Procedure: Carotid Angiography;  Surgeon: Berry, Jonathan J, MD;  Location: [facility] INVASIVE CV [facility];  Service: Cardiovascular;;  Rt. Carotid  . CAROTID PTA/STENT INTERVENTION N/A 07/10/2016   Procedure: Carotid PTA/Stent Intervention;  Surgeon: Berry, Jonathan J, MD;  Location: [facility] IN[facility]AS[facility] CV [facility];  Service: Cardiovascular;  Laterality: N/A;  Rt. External Carotid  . COLON SURGERY    . COLONOSCOPY    . COLOSTOMY    . COLOSTOMY TAKEDOWN  06/13/2015  . COLOSTOMY TAKEDOWN N/A 06/13/2015   Procedure: COLOSTOMY TAKEDOWN;  Surgeon: Douglas Blackman, MD;  Location: [facility] OR;  Service: General;  Laterality: N/A;  . CORONARY ARTERY BYPASS GRAFT  09/2004   SVG to LAD (at [facility])  . DIRECT LARYNGOSCOPY N/A 07/06/2014   Procedure: DIRECT LARYNGOSCOPY AND ESOPAGOSCOPY WITH BIOPSY;  Surgeon: Jefry Rosen, MD;  Location: [facility] OR;  Service: ENT;  Laterality:   N/A;  . ESOPHAGOSCOPY WITH DILITATION N/A 10/31/2015   Procedure: ESOPHAGOSCOPY WITH DILITATION;  Surgeon: Jefry Rosen, MD;  Location: [facility] OR;  Service: ENT;  Laterality: N/A;  . ILIAC ARTERY STENT  07/24/2008   8x6 Smart nitinol self-expanding stent to origin of ilaci down into external iliac crossing the hypogastric artery (Dr. J. Berry)  . INCISIONAL HERNIA REPAIR  06/13/2015  . LAPAROTOMY N/A 08/05/2014   Procedure: EXPLORATORY LAPAROTOMY WITH SIGMOIDOTOMY WITH COLECTOMY & COLOSTOMY;  Surgeon: Douglas Blackman, MD;  Location: [facility] OR;  Service: General;  Laterality: N/A;  . LEFT HEART CATHETERIZATION WITH CORONARY/GRAFT ANGIOGRAM N/A 06/15/2013   Procedure: LEFT HEART CATHETERIZATION WITH CORONARY/GRAFT ANGIOGRAM;  Surgeon: Henry W Smith III, MD;  Location: [facility] CATH [facility];  Service: Cardiovascular;  Laterality: N/A;  . LOWER EXTREMITY ARTERIAL DOPPLER  08/2008   right and left ABI - mild  arterial insufficiency at rest; R CIA/stent with 50-69% narrowing, L CIA with increased velocities (50-69% diameter reduction)  . NM MYOCAR PERF WALL MOTION  06/2008   persantine myoview - normal pattern of systolic thickening/wall motion/perfusion;  . PEG TUBE REMOVAL    . PERIPHERAL VASCULAR CATHETERIZATION  05/2013   stent to SVG - LAD vein graft at [facility]  . PERIPHERAL VASCULAR CATHETERIZATION N/A 05/08/2015   Procedure: Aortic Arch Angiography;  Surgeon: Vance W Brabham, MD;  Location: [facility];  Service: Cardiovascular;  Laterality: N/A;  . PERIPHERAL VASCULAR CATHETERIZATION N/A 05/08/2015   Procedure: Carotid Angiography;  Surgeon: Vance W Brabham, MD;  Location: [facility];  Service: Cardiovascular;  Laterality: N/A;  . PERIPHERAL VASCULAR CATHETERIZATION  06/13/2015   Procedure: PORTA CATH REMOVAL;  Surgeon: Douglas Blackman, MD;  Location: [facility] OR;  Service: General;;  . PORT-A-CATH REMOVAL  06/13/2015  . RENAL ARTERY DOPPLER  05/2008   right and left prox renal arteries with narrowing and increased velocities (60-99% diameter reduction)  . SUBCLAVIAN ANGIOGRAM Left 08/20/2004   8x29mm Genesis balloon mounted stent       Family History  Problem Relation Age of Onset  . Heart disease Maternal Aunt   . Heart attack Mother   . Stroke Mother   . Cancer Father   . Stroke Sister   . Heart Problems Sister     Social History   Tobacco Use  . Smoking status: Former Smoker    Packs/day: 4.00    Years: 45.00    Pack years: 180.00    Types: Cigarettes    Quit date: 06/09/2013    Years since quitting: 5.7  . Smokeless tobacco: Former User  Substance Use Topics  . Alcohol use: Yes    Alcohol/week: 21.0 standard drinks    Types: 21 Cans of beer per week  . Drug use: No    Home Medications Prior to Admission medications   Medication Sig Start Date End Date Taking? Authorizing Provider  ALPRAZolam (XANAX) 0.5 MG tablet Take 0.5 mg by mouth 2 (two) times daily.      [provider]  aspirin 81 MG EC tablet Take 1 tablet (81 mg total) by mouth daily. 02/05/17   Croitoru, Mihai, MD  atorvastatin (LIPITOR) 80 MG tablet TAKE 1 TABLET BY MOUTH DAILY AT 6:00 PM 02/22/19   Croitoru, Mihai, MD  buPROPion (WELLBUTRIN) 100 MG tablet Take 100 mg by mouth daily.     [provider]  carvedilol (COREG) 6.25 MG tablet TAKE 1 TABLET(6.25 MG) BY MOUTH TWICE DAILY WITH A MEAL Patient taking differently: Take 6.25   mg by mouth 2 (two) times daily with a meal.  07/02/16   Croitoru, Mihai, MD  clopidogrel (PLAVIX) 75 MG tablet TAKE 1 TABLET(75 MG) BY MOUTH DAILY 09/13/18   Dickson, Christopher S, MD    Allergies    Cetuximab  Review of Systems   Review of Systems  Gastrointestinal: Positive for abdominal pain.  All other systems reviewed and are negative.   Physical Exam Updated Vital Signs BP 112/79 (BP Location: Right Arm)   Pulse 93   Temp 98.1 F (36.7 C) (Oral)   Resp 16   Ht 5' 7" (1.702 m)   Wt 68 kg   SpO2 99%   BMI 23.49 kg/m   Physical Exam Vitals and nursing note reviewed.  Constitutional:      General: He is not in acute distress.    Comments: Chronically ill-appearing male appears to be in no acute distress.  HENT:     Head: Atraumatic.  Eyes:     Conjunctiva/sclera: Conjunctivae normal.  Cardiovascular:     Rate and Rhythm: Normal rate and regular rhythm.  Pulmonary:     Effort: Pulmonary effort is normal.     Breath sounds: Normal breath sounds. No wheezing, rhonchi or rales.  Abdominal:     General: Abdomen is flat.     Palpations: Abdomen is soft.     Tenderness: There is abdominal tenderness (Mild tenderness to right lower abdomen on palpation without guarding rebound tenderness.  No significant epigastric tenderness.  Negative Murphy sign, no pain at McBurney's point.  Bowel sounds present.).  Genitourinary:    Comments: Chaperone present during exam.  Melanotic stool noted, no obvious mass, no frank bleeding, normal  rectal tone. Musculoskeletal:     Cervical back: Neck supple.  Skin:    Findings: No rash.  Neurological:     Mental Status: He is alert.     ED Results / Procedures / Treatments   Labs (all labs ordered are listed, but only abnormal results are displayed) Labs Reviewed  COMPREHENSIVE METABOLIC PANEL - Abnormal; Notable for the following components:      Result Value   Sodium 131 (*)    Chloride 91 (*)    Calcium 8.6 (*)    Total Protein 5.8 (*)    Albumin 2.6 (*)    AST 453 (*)    ALT 205 (*)    Alkaline Phosphatase 802 (*)    Total Bilirubin 2.0 (*)    Anion gap 16 (*)    All other components within normal limits  CBC - Abnormal; Notable for the following components:   WBC 12.6 (*)    RBC 3.68 (*)    Hemoglobin 11.3 (*)    HCT 33.8 (*)    RDW 16.3 (*)    All other components within normal limits  ACETAMINOPHEN LEVEL - Abnormal; Notable for the following components:   Acetaminophen (Tylenol), Serum <10 (*)    All other components within normal limits  POC OCCULT BLOOD, ED - Abnormal; Notable for the following components:   Fecal Occult Bld POSITIVE (*)    All other components within normal limits  SARS CORONAVIRUS 2 (TAT 6-24 HRS)  PROTIME-INR  HEPATITIS PANEL, ACUTE  TYPE AND SCREEN    EKG None  Radiology CT ABDOMEN PELVIS W CONTRAST  Result Date: 03/09/2019 CLINICAL DATA:  Abdominal pain. EXAM: CT ABDOMEN AND PELVIS WITH CONTRAST TECHNIQUE: Multidetector CT imaging of the abdomen and pelvis was performed using the standard protocol following bolus administration   of intravenous contrast. CONTRAST:  100mL OMNIPAQUE IOHEXOL 300 MG/ML  SOLN COMPARISON:  08/22/2014 FINDINGS: Lower chest: Mild atelectasis in the lung bases. No pleural effusion. Three-vessel coronary atherosclerosis. Hepatobiliary: New marked heterogeneous enhancement throughout the liver which is now diffusely enlarged and nodular. Unremarkable gallbladder. No biliary dilatation. Pancreas:  Unremarkable. Spleen: Unremarkable. Adrenals/Urinary Tract: Unremarkable adrenal glands. Excreted contrast in the renal collecting systems limiting assessment for calculi. No hydronephrosis. Subcentimeter hypodensity in the upper pole of the left kidney, unchanged and too small to fully characterize. Excreted contrast in the ureters and bladder. Moderate bladder distension. Stomach/Bowel: The stomach is unremarkable. Sequelae of previous sigmoid colectomy are again identified with interval colostomy takedown and reanastomosis. There is no evidence of bowel obstruction. There is submucosal fat deposition in the ascending colon and hepatic flexure likely reflecting the sequelae of prior inflammation. The appendix is unremarkable. Vascular/Lymphatic: Diffuse abdominal aortic atherosclerosis without aneurysm. No enlarged lymph nodes. Reproductive: Unremarkable prostate. Other: Fat containing left lower quadrant ventral hernia at the site of the prior colostomy. Small fat containing epigastric and left inguinal hernias. Trace perihepatic free fluid. Musculoskeletal: No acute osseous abnormality or suspicious osseous lesion. IMPRESSION: 1. New marked heterogeneity and enlargement of the liver consistent with diffuse metastases. 2. Fat containing left lower quadrant hernia at the site of a previous colostomy. 3. Small fat containing epigastric and left inguinal hernias. 4. Aortic Atherosclerosis (ICD10-I70.0). Electronically Signed   By: Allen  Grady M.D.   On: 03/09/2019 17:37    Procedures Procedures (including critical care time)  Medications Ordered in ED Medications  pantoprazole (PROTONIX) injection 40 mg (has no administration in time range)  sodium chloride 0.9 % bolus 1,000 mL (1,000 mLs Intravenous New Bag/Given 03/09/19 1310)  morphine 4 MG/ML injection 4 mg (4 mg Intravenous Given 03/09/19 1316)  ondansetron (ZOFRAN) injection 4 mg (4 mg Intravenous Given 03/09/19 1316)  iohexol (OMNIPAQUE) 300 MG/ML  solution 100 mL (100 mLs Intravenous Contrast Given 03/09/19 1711)    ED Course  I have reviewed the triage vital signs and the nursing notes.  Pertinent labs & imaging results that were available during my care of the patient were reviewed by me and considered in my medical decision making (see chart for details).    MDM Rules/Calculators/A&P                      BP 127/74   Pulse 89   Temp 98.1 F (36.7 C) (Oral)   Resp 17   Ht 5' 7" (1.702 m)   Wt 68 kg   SpO2 100%   BMI 23.49 kg/m   Final Clinical Impression(s) / ED Diagnoses Final diagnoses:  Gastrointestinal hemorrhage, unspecified gastrointestinal hemorrhage type  Liver metastases (HCC)  Transaminitis    Rx / DC Orders ED Discharge Orders    None     12:50 PM Patient here with abdominal pain as well as having black tarry stool and reportedly vomited up some jet black emesis.  He is currently on Plavix which may increase risk of GI bleed.  He will benefit from an abdominal pelvic CT scan for further evaluation.  He does have melanotic stool on exam, fecal occult blood test is currently pending.  Initial hemoglobin is 11.3.  Mild elevated white count of 12.6.  6:04 PM Fecal occult blood test is positive.  Hemoglobin is 11.3.  Mildly elevated white count of 12.6.  Significant transaminitis with AST 453, ALT 205, alk phos 802 and total bili of   2.  Normal INR.  Hepatitis panel was normal.  Tylenol level was normal.  Covid test is negative.  CT of the abdomen and pelvis demonstrate new marked heterogenicity and enlargement of the liver consistent with diffuse metastasis.  6:08 PM Patient does have history of squamous cells carcinoma of the larynx.  Suspect that is the primary source with mets to the liver.  7:19 PM I have reached out to pt's family per request.  Daughter request to be at bedside prior to [place] given pt his cancer diagnosis.    I also appreciate consultation from GI specialist Dr. Collene Mares who request IV  protonix, clear liquid diet and anticipate endoscopy tomorrow.  Will consult for admission.    8:30 PM Appreciate consultation from [facility] Dr. Roel Cluck who agrees to see and admit pt for further care.    [name] was evaluated in Emergency Department on 03/09/2019 for the symptoms described in the history of present illness. He was evaluated in the context of the global COVID-19 pandemic, which necessitated consideration that the patient might be at risk for infection with the SARS-CoV-2 virus that causes COVID-19. Institutional protocols and algorithms that pertain to the evaluation of patients at risk for COVID-19 are in a state of rapid change based on information released by regulatory bodies including the CDC and federal and state organizations. These policies and algorithms were followed during the patient's care in the ED.    Domenic Moras, PA-C 03/09/19 2031    Charlesetta Shanks, MD 03/11/19 [phone]

## 2019-03-09 NOTE — H&P (Signed)
[name] [id number] DOB: 1955-02-27 DOA: 03/09/2019     PCP: [name], NP   Outpatient Specialists:    [id number] [facility]  ENT Dr. [name] Vascular Dr. Trula Slade Patient arrived to ER on 03/09/19 at 1040  Patient coming from: home Lives  With family    Chief Complaint:   Chief Complaint  Patient presents with  . GI Bleeding  . Abdominal Pain    HPI: [name] is a 64 y.o. male with medical history significant of laryngeal cancer tobacco and alcohol abuse, anxiety, carotid artery stenosis, remote hx of CAD, cardiac arrest 2015    Presented with  3 day of black vomit and black stools. RUQ pain he reports that he initially was evaluated at urgent care and was told he is probably constipated was told to take some laxative and drink water and see if that helps it did not seem to help his abdominal pain but there after he started to developed nausea vomiting and black stools nausea productive of jet black-colored emesis.  decreased PO inteke loss of weight has been fatigued  Pt is on aspirin and Plavix for hx of carotid artery disease followed by Dr. Scot Dock  Her records it seems that he can be taken off of Plavix but patient chose to stay on  Last time he had stents was few year ago  Patient states that he stopped smoking for the past few years And says stop drinking he only been drinking 3-4 beers a day never drink any liquor and has decreased that and stoped. He has in general decreased amount of p.o. intake due to lack of appetite. Also reports has sometimes trouble swallowing but mainly if he chews his food good he does okay   iInfectious risk factors:  Reports N/V/ abdominal pain,   fatigue     In  ER  COVID TEST  NEGATIVE     Lab Results  Component Value Date   [place] NEGATIVE 03/09/2019     Regarding pertinent Chronic problems:    Hyperlipidemia - on statins Lipitor   HTN on Coreg   History of carotid artery stenosis status  post stenting had a 90% stenosis in 2018 found incidentally while working out throat cancer.   Status post right carotid stenting.  But had progression of his stenosis he has undergone angiogram by Dr. Trula Slade in September 2019 no intervention needed at a time showing 30 to 40% in-stent stenosis of the right carotid stent It was deemed safe for patient to come off of Plavix at that time Patient resumed his Plavix because he stated that he felt better when he was taking it   History of cardiac arrest and NSVT in 2015 last echogram in 2016 showing EF 50-55%.  Has been doing well since then.  OSA -supposed to be using  CPAP but has not been arranged yet   While in ER: Hyponatremia elevated LFTs total bilirubin up to 2.0 CT scan consistent with new metastasis to the liver Hemoccult positive Started on Protonix   Hemoglobin stable at 11.3 but somewhat hypotensive  ER Provider Called: GI     Dr.Mann  They Recommend admit to medicine   Will see in AM    The following Work up has been ordered so far:  Orders Placed This Encounter  Procedures  . SARS CORONAVIRUS 2 (TAT 6-24 HRS) Nasopharyngeal Nasopharyngeal Swab  . CT ABDOMEN PELVIS W CONTRAST  . Comprehensive metabolic panel  . CBC  .  Protime-INR - (order if Patient is taking Coumadin / Warfarin)  . Acetaminophen level  . Hepatitis panel, acute  . Diet clear liquid Room service appropriate? Yes; Fluid consistency: Thin  . Initiate Carrier Fluid Protocol  . Consult to gastroenterology  ALL PATIENTS BEING ADMITTED/HAVING PROCEDURES NEED COVID-19 SCREENING  . Consult for [place] Admission  ALL PATIENTS BEING ADMITTED/HAVING PROCEDURES NEED COVID-19 SCREENING  . Pulse oximetry, continuous  . POC occult blood, ED  . Type and screen [place]   Following Medications were ordered in ER: Medications  pantoprazole (PROTONIX) injection 40 mg (has no administration in time range)  sodium chloride 0.9 % bolus 1,000  mL (1,000 mLs Intravenous New Bag/Given 03/09/19 1310)  morphine 4 MG/ML injection 4 mg (4 mg Intravenous Given 03/09/19 1316)  ondansetron (ZOFRAN) injection 4 mg (4 mg Intravenous Given 03/09/19 1316)  iohexol (OMNIPAQUE) 300 MG/ML solution 100 mL (100 mLs Intravenous Contrast Given 03/09/19 1711)     Significant initial  Findings: Abnormal Labs Reviewed  COMPREHENSIVE METABOLIC PANEL - Abnormal; Notable for the following components:      Result Value   Sodium 131 (*)    Chloride 91 (*)    Calcium 8.6 (*)    Total Protein 5.8 (*)    Albumin 2.6 (*)    AST 453 (*)    ALT 205 (*)    Alkaline Phosphatase 802 (*)    Total Bilirubin 2.0 (*)    Anion gap 16 (*)    All other components within normal limits  CBC - Abnormal; Notable for the following components:   WBC 12.6 (*)    RBC 3.68 (*)    Hemoglobin 11.3 (*)    HCT 33.8 (*)    RDW 16.3 (*)    All other components within normal limits  ACETAMINOPHEN LEVEL - Abnormal; Notable for the following components:   Acetaminophen (Tylenol), Serum <10 (*)    All other components within normal limits  POC OCCULT BLOOD, ED - Abnormal; Notable for the following components:   Fecal Occult Bld POSITIVE (*)    All other components within normal limits    Otherwise labs showing:    Recent Labs  Lab 03/09/19 1123  NA 131*  K 4.8  CO2 24  GLUCOSE 97  BUN 11  CREATININE 1.17  CALCIUM 8.6*    Cr   stable,   Lab Results  Component Value Date   CREATININE 1.17 03/09/2019   CREATININE 1.10 10/01/2017   CREATININE 0.92 07/11/2016    Recent Labs  Lab 03/09/19 1123  AST 453*  ALT 205*  ALKPHOS 802*  BILITOT 2.0*  PROT 5.8*  ALBUMIN 2.6*   Lab Results  Component Value Date   CALCIUM 8.6 (L) 03/09/2019   PHOS 3.2 10/20/2014   WBC        Component Value Date/Time   WBC 12.6 (H) 03/09/2019 1123   ANC    Component Value Date/Time   NEUTROABS 6.4 11/06/2014 1249   ALC No components found for: LYMPHAB    Plt: Lab  Results  Component Value Date   PLT 321 03/09/2019     Lactic Acid, Venous    Component Value Date/Time   LATICACIDVEN 1.21 10/09/2014 1249    COVID-19 Labs  No results for input(s): DDIMER, FERRITIN, LDH, CRP in the last 72 hours.  Lab Results  Component Value Date   SARSCOV2NAA NEGATIVE 03/09/2019   HG/HCT  stable,       Component Value  Date/Time   HGB 11.3 (L) 03/09/2019 1123   HGB 11.6 (L) 11/06/2014 1249   HCT 33.8 (L) 03/09/2019 1123   HCT 36.0 (L) 11/06/2014 1249    No results for input(s): LIPASE, AMYLASE in the last 168 hours. No results for input(s): AMMONIA in the last 168 hours.  No components found for: LABALBU   ECG: not  Ordered     UA  not ordered     Ordered    CTabd/pelvis -new metastatic spread to the liver      ED Triage Vitals  Enc Vitals Group     BP 03/09/19 1047 112/79     Pulse Rate 03/09/19 1047 93     Resp 03/09/19 1047 16     Temp 03/09/19 1047 98.1 F (36.7 C)     Temp Source 03/09/19 1047 Oral     SpO2 03/09/19 1047 99 %     Weight 03/09/19 1057 150 lb (68 kg)     Height 03/09/19 1057 5\' 7"  (1.702 m)     Head Circumference --      Peak Flow --      Pain Score 03/09/19 1057 5     Pain Loc --      Pain Edu? --      Excl. in [place]? --   TMAX(24)@       Latest  Blood pressure 127/74, pulse 89, temperature 98.1 F (36.7 C), temperature source Oral, resp. rate 17, height 5\' 7"  (1.702 m), weight 68 kg, SpO2 100 %.     Hospitalist was called for admission for Upper Gi bleed and new finding of metastases likely from prior laryngeal cancer   Review of Systems:    Pertinent positives include: melena, blood in stool, hematemesis  Constitutional:  No weight loss, night sweats, Fevers, chills, fatigue, weight loss  HEENT:  No headaches, Difficulty swallowing,Tooth/dental problems,Sore throat,  No sneezing, itching, ear ache, nasal congestion, post nasal drip,  Cardio-vascular:  No chest pain, Orthopnea, PND, anasarca,  dizziness, palpitations.no Bilateral lower extremity swelling  GI:  No heartburn, indigestion, abdominal pain, nausea, vomiting, diarrhea, change in bowel habits, loss of appetite,  Resp:  no shortness of breath at rest. No dyspnea on exertion, No excess mucus, no productive cough, No non-productive cough, No coughing up of blood.No change in color of mucus.No wheezing. Skin:  no rash or lesions. No jaundice GU:  no dysuria, change in color of urine, no urgency or frequency. No straining to urinate.  No flank pain.  Musculoskeletal:  No joint pain or no joint swelling. No decreased range of motion. No back pain.  Psych:  No change in mood or affect. No depression or anxiety. No memory loss.  Neuro: no localizing neurological complaints, no tingling, no weakness, no double vision, no gait abnormality, no slurred speech, no confusion  All systems reviewed and apart from [place] all are negative  Past Medical History:   Past Medical History:  Diagnosis Date  . Acute encephalopathy, improved 06/20/2013  . Acute MI, troponin > 20, no obvious culprit vessel 06/20/2013  . Anxiety   . Arthritis   . At risk for sudden cardiac death, has lifevest at discharge 06/20/2013  . Carotid stenosis   . Coronary artery disease    s/p CABG 2006; stenting to vein graft 05-2013; NSTEMI 06-2013 in setting of PEA arrest  . Depression   . Difficulty swallowing solids    Pt unable to swallow solid foods  . Dizziness 06/20/2013  .  Dyslipidemia 10/09/2013   [place] Study atorvastatin -eIRB # [id number] tablet Take 40 mg by mouth daily.   . Fever, possible aspiration-treated with antibiotics 06/20/2013  . GERD (gastroesophageal reflux disease)   . Heart failure, acute on chronic, systolic and diastolic ([place]) [id number]  . Heart murmur   . History of radiation therapy 09/05/14-10/23/14   larynx 70 Gy  . Hyperlipidemia   . Hypertension   . Hypothermia, induced, post arrest 06/20/2013  . Incisional hernia   . Ischemic  cardiomyopathy    echo 06/2013 EF 10-15% (normal 01/2013)  . PAD (peripheral artery disease) ([place]) 10/09/2013   Occluded left common carotid and moderate right internal carotid artery stenosis. Previous stent to the left subclavian artery. Right iliac stent. Bilateral 60-99% renal artery stenosis    . Radiation-induced esophageal stricture   . Shock liver 06/20/2013  . Shortness of breath dyspnea    with exertion  . 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 Subclavian artery stenosis, left (HCC)    s/p stenting 2006  . Tobacco abuse    >100 pack year history   . Weakness due to cardiac arrest 06/20/2013  . Wears glasses        Past Surgical History:  Procedure Laterality Date  . CARDIAC CATHETERIZATION    . CAROTID ANGIOGRAPHY  07/10/2016   Procedure: Carotid Angiography;  Surgeon: Lorretta Harp, MD;  Location: [place] CV LAB;  Service: Cardiovascular;;  Rt. Carotid  . CAROTID PTA/STENT INTERVENTION N/A 07/10/2016   Procedure: Carotid PTA/Stent Intervention;  Surgeon: Lorretta Harp, MD;  Location: [place] CV LAB;  Service: Cardiovascular;  Laterality: N/A;  Rt. External Carotid  . COLON SURGERY    . COLONOSCOPY    . COLOSTOMY    . COLOSTOMY TAKEDOWN  06/13/2015  . COLOSTOMY TAKEDOWN N/A 06/13/2015   Procedure: COLOSTOMY TAKEDOWN;  Surgeon: Coralie Keens, MD;  Location: [place];  Service: General;  Laterality: N/A;  . CORONARY ARTERY BYPASS GRAFT  09/2004   SVG to LAD (at [facility])  . DIRECT LARYNGOSCOPY N/A 07/06/2014   Procedure: DIRECT LARYNGOSCOPY AND ESOPAGOSCOPY WITH BIOPSY;  Surgeon: [name], MD;  Location: [place];  Service: ENT;  Laterality: N/A;  . ESOPHAGOSCOPY WITH DILITATION N/A 10/31/2015   Procedure: ESOPHAGOSCOPY WITH DILITATION;  Surgeon: [name], MD;  Location: [place];  Service: ENT;  Laterality: N/A;  . ILIAC ARTERY STENT  07/24/2008   8x6 Smart nitinol self-expanding stent to origin of ilaci down into external iliac crossing the hypogastric  artery (Dr. Adora Fridge)  . [place] REPAIR  06/13/2015  . LAPAROTOMY N/A 08/05/2014   Procedure: EXPLORATORY LAPAROTOMY WITH SIGMOIDOTOMY WITH COLECTOMY & COLOSTOMY;  Surgeon: Coralie Keens, MD;  Location: [place];  Service: General;  Laterality: N/A;  . LEFT HEART CATHETERIZATION WITH CORONARY/GRAFT ANGIOGRAM N/A 06/15/2013   Procedure: LEFT HEART CATHETERIZATION WITH Beatrix Fetters;  Surgeon: Sinclair Grooms, MD;  Location: [facility] LAB;  Service: Cardiovascular;  Laterality: N/A;  . LOWER EXTREMITY ARTERIAL DOPPLER  08/2008   right and left ABI - mild arterial insufficiency at rest; R CIA/stent with 50-69% narrowing, L CIA with increased velocities (50-69% diameter reduction)  . NM MYOCAR PERF WALL MOTION  06/2008   persantine myoview - normal pattern of systolic thickening/wall motion/perfusion;  . PEG TUBE REMOVAL    . PERIPHERAL VASCULAR CATHETERIZATION  05/2013   stent to SVG - LAD vein graft [facility]  . PERIPHERAL VASCULAR CATHETERIZATION N/A 05/08/2015   Procedure:  Aortic Arch Angiography;  Surgeon: Serafina Mitchell, MD;  Location: [place] CV LAB;  Service: Cardiovascular;  Laterality: N/A;  . PERIPHERAL VASCULAR CATHETERIZATION N/A 05/08/2015   Procedure: Carotid Angiography;  Surgeon: Serafina Mitchell, MD;  Location: [place] CV LAB;  Service: Cardiovascular;  Laterality: N/A;  . PERIPHERAL VASCULAR CATHETERIZATION  06/13/2015   Procedure: PORTA CATH REMOVAL;  Surgeon: Coralie Keens, MD;  Location: [place];  Service: General;;  . PORT-A-CATH REMOVAL  06/13/2015  . RENAL ARTERY DOPPLER  05/2008   right and left prox renal arteries with narrowing and increased velocities (60-99% diameter reduction)  . SUBCLAVIAN ANGIOGRAM Left 08/20/2004   8x50mm Genesis balloon mounted stent    Social History:  Ambulatory   Independently      reports that he quit smoking about 5 years ago. His smoking use included cigarettes. He has a 180.00 pack-year smoking history. He has quit  using smokeless tobacco. He reports current alcohol use of about 21.0 standard drinks of alcohol per week. He reports that he does not use drugs.     Family History:   Family History  Problem Relation Age of Onset  . Heart disease Maternal Aunt   . Heart attack Mother   . Stroke Mother   . Cancer Father   . Stroke Sister   . Heart Problems Sister     Allergies: Allergies  Allergen Reactions  . Cetuximab Anaphylaxis    Cardiac arrest 08/02/2014     Prior to Admission medications   Medication Sig Start Date End Date Taking? Authorizing Provider  ALPRAZolam Duanne Moron) 0.5 MG tablet Take 0.5 mg by mouth 2 (two) times daily.    Yes [provider]  aspirin 81 MG EC tablet Take 1 tablet (81 mg total) by mouth daily. 02/05/17  Yes Croitoru, Mihai, MD  atorvastatin (LIPITOR) 80 MG tablet TAKE 1 TABLET BY MOUTH DAILY AT 6:00 PM Patient taking differently: Take 80 mg by mouth daily. TAKE 1 TABLET BY MOUTH DAILY AT 6:00 PM 02/22/19  Yes Croitoru, Mihai, MD  buPROPion (WELLBUTRIN) 100 MG tablet Take 100 mg by mouth 3 (three) times daily.    Yes [provider]  carvedilol (COREG) 6.25 MG tablet TAKE 1 TABLET(6.25 MG) BY MOUTH TWICE DAILY WITH A MEAL Patient taking differently: Take 6.25 mg by mouth 2 (two) times daily with a meal.  07/02/16  Yes Croitoru, Mihai, MD  clopidogrel (PLAVIX) 75 MG tablet TAKE 1 TABLET(75 MG) BY MOUTH DAILY Patient taking differently: Take 75 mg by mouth daily.  09/13/18  Yes Angelia Mould, MD  [facility] 10 GM/15ML SOLN Take 10 g by mouth daily. 02/19/19  Yes [provider]  Vitamins/Minerals TABS Take 1 tablet by mouth daily.   Yes [provider]   Physical Exam: Blood pressure 127/74, pulse 89, temperature 98.1 F (36.7 C), temperature source Oral, resp. rate 17, height 5\' 7"  (1.702 m), weight 68 kg, SpO2 100 %. 1. General:  in No  Acute distress    Chronically ill cachectic -appearing 2. Psychological: Alert and    Oriented 3. Head/ENT:    Dry Mucous Membranes                          Head Non traumatic, neck supple                          Poor Dentition 4. SKIN: * decreased Skin turgor,  Skin clean Dry and intact no rash 5. Heart: Regular rate and rhythm no  Murmur, no Rub or gallop 6. Lungs:  no wheezes or crackles   7. Abdomen: Soft,RUQ tender, Non distended  bowel sounds present 8. Lower extremities: no clubbing, cyanosis, no edema 9. Neurologically Grossly intact, moving all 4 extremities equally   10. MSK: Normal range of motion   All other LABS:     Recent Labs  Lab 03/09/19 1123  WBC 12.6*  HGB 11.3*  HCT 33.8*  MCV 91.8  PLT 321     Recent Labs  Lab 03/09/19 1123  NA 131*  K 4.8  CL 91*  CO2 24  GLUCOSE 97  BUN 11  CREATININE 1.17  CALCIUM 8.6*     Recent Labs  Lab 03/09/19 1123  AST 453*  ALT 205*  ALKPHOS 802*  BILITOT 2.0*  PROT 5.8*  ALBUMIN 2.6*       Cultures:    Component Value Date/Time   SDES WOUND NECK 10/12/2014 1159   SPECREQUEST NONE 10/12/2014 1159   CULT  10/12/2014 1159    MODERATE STAPHYLOCOCCUS AUREUS Note: RIFAMPIN AND GENTAMICIN SHOULD NOT BE USED AS SINGLE DRUGS FOR TREATMENT OF STAPH INFECTIONS. Performed at [place] 10/15/2014 FINAL 10/12/2014 1159     Radiological Exams on Admission: CT ABDOMEN PELVIS W CONTRAST  Result Date: 03/09/2019 CLINICAL DATA:  Abdominal pain. EXAM: CT ABDOMEN AND PELVIS WITH CONTRAST TECHNIQUE: Multidetector CT imaging of the abdomen and pelvis was performed using the standard protocol following bolus administration of intravenous contrast. CONTRAST:  148mL OMNIPAQUE IOHEXOL 300 MG/ML  SOLN COMPARISON:  08/22/2014 FINDINGS: Lower chest: Mild atelectasis in the lung bases. No pleural effusion. Three-vessel coronary atherosclerosis. Hepatobiliary: New marked heterogeneous enhancement throughout the liver which is now diffusely enlarged and nodular. Unremarkable gallbladder. No  biliary dilatation. Pancreas: Unremarkable. Spleen: Unremarkable. Adrenals/Urinary Tract: Unremarkable adrenal glands. Excreted contrast in the renal collecting systems limiting assessment for calculi. No hydronephrosis. Subcentimeter hypodensity in the upper pole of the left kidney, unchanged and too small to fully characterize. Excreted contrast in the ureters and bladder. Moderate bladder distension. Stomach/Bowel: The stomach is unremarkable. Sequelae of previous sigmoid colectomy are again identified with interval colostomy takedown and reanastomosis. There is no evidence of bowel obstruction. There is submucosal fat deposition in the ascending colon and hepatic flexure likely reflecting the sequelae of prior inflammation. The appendix is unremarkable. Vascular/Lymphatic: Diffuse abdominal aortic atherosclerosis without aneurysm. No enlarged lymph nodes. Reproductive: Unremarkable prostate. Other: Fat containing left lower quadrant ventral hernia at the site of the prior colostomy. Small fat containing epigastric and left inguinal hernias. Trace perihepatic free fluid. Musculoskeletal: No acute osseous abnormality or suspicious osseous lesion. IMPRESSION: 1. New marked heterogeneity and enlargement of the liver consistent with diffuse metastases. 2. Fat containing left lower quadrant hernia at the site of a previous colostomy. 3. Small fat containing epigastric and left inguinal hernias. 4. Aortic Atherosclerosis (ICD10-I70.0). Electronically Signed   By: Logan Bores M.D.   On: 03/09/2019 17:37    Chart has been reviewed    Assessment/Plan   64 y.o. male with medical history significant of laryngeal cancer tobacco and alcohol abuse, anxiety, carotid artery stenosis history of cardiac arrest in 2015 CAD sp CABG    Admitted for upper GI bleed and new metastatic spread to the liver  Present on Admission: . Upper GI bleed -  - Glasgow [place] score   Hg 99991111  systolic BP <  110    melena       hepatic disease   >1 Justifies admission and aggressive management      Modifying risk factors include:    NSAIDS use  alcohol abuse   liver disease        -    AIMS 65 = Alb <3,     Worrisome       -    hemodynamic instability present        Admit to stepdown given above    -  ER  Provider spoke to gastroenterology (Dr. Collene Mares) they will see patient in a.m. appreciate their consult   - serial CBC.    - Monitor for any recurrence,  evidence of hemodynamic instability or significant blood loss  - Transfuse as needed for hemoglobin below 7 or evidence of life-threatening bleeding  - Establish at least 2 PIV and fluid resuscitate   - clear liquids for tonight keep nothing by mouth post midnight,   -  administer Protonix  drip    . Squamous cell carcinoma of larynx (HCC) -evidence of metastatic spread to the liver suspicious for progression of known malignancy.  Discussed with oncology who will see patient in consult     . Elevated LFTs most likely secondary to liver metastases.  Although patient reports a history of alcohol abuse.  . Liver metastases ([place]) most likely secondary to laryngeal cancer.  Discussed with oncology.  Will need further staging to see if there is any other sources that could be biopsied most safely in the setting of recent Plavix use.  Will order   neck as well chest CT See if other options for biopsy available   . Dyslipidemia chronic stable continue home medications   . Chronic combined systolic and diastolic heart failure (HCC) -currently somewhat on a dry side will rehydrate gently   . Cancer associated pain -pain management as needed   . Protein calorie malnutrition ([place]) -we will need nutritional evaluation   Given some trouble swallowing will have speech pathology evaluate  . Occlusion of left carotid artery -hold off Plavix   . Coronary artery disease involving coronary bypass graft of native heart without angina pectoris -  Hold aspirin and Plavix hold  off of   Coreg given soft blood pressures  . Anxiety -resume home medications  History of alcohol abuse states that he quit drinking weeks ago.  Will monitor for any signs of withdrawal  . Hypertension -given GI bleed will allow permissive hypertension   . Tobacco abuse -in remission    Other plan as per orders.  DVT prophylaxis:  SCD     Code Status:  FULL CODE as per patient   I had personally discussed CODE STATUS with patient   Family Communication:   Family not at  Bedside   Disposition Plan:    To home once workup is complete and patient is stable                      Would benefit from PT/OT eval prior to DC  Ordered                   Swallow eval - SLP ordered                                      Nutrition    consulted  Consults called: Dr. Hoyt Koch,  Dr. Irene Limbo oncology   Admission status:  ED Disposition    ED Disposition Condition Comment   Admit  The patient appears reasonably stabilized for admission considering the current resources, flow, and capabilities available in the ED at this time, and I doubt any other [facility] requiring further screening and/or treatment in the ED prior to admission is  present.          inpatient     Expect 2 midnight stay secondary to severity of patient's current illness including   hemodynamic instability despite optimal treatment ( hypotension )   Severe lab/radiological/exam abnormalities including:   Evidence of new metastases to the liver elevated LFTs and extensive comorbidities including:    liver disease      malignancy, .    That are currently affecting medical management.   I expect  patient to be hospitalized for 2 midnights requiring inpatient medical care.  Patient is at high risk for adverse outcome (such as loss of life or disability) if not treated.  Indication for inpatient stay as follows:    Hemodynamic instability despite maximal medical therapy,    severe pain  requiring acute inpatient management,  inability to maintain oral hydration    Need for operative/procedural  intervention    Need for  IV fluids,IV PPI     Level of care        SDU tele indefinitely please discontinue once patient no longer qualifies   Precautions: admitted as   Covid Negative      PPE: Used by the provider:   P100  eye Goggles,  Gloves    Schyler Butikofer 03/09/2019, 9:50 PM    [facility]     after 2 AM please page floor coverage PA If 7AM-7PM, please contact the day team taking care of the patient using [email or web address]   Patient was evaluated in the context of the global COVID-19 pandemic, which necessitated consideration that the patient might be at risk for infection with the SARS-CoV-2 virus that causes COVID-19. Institutional protocols and algorithms that pertain to the evaluation of patients at risk for COVID-19 are in a state of rapid change based on information released by regulatory bodies including the CDC and federal and state organizations. These policies and algorithms were followed during the patient's care.

## 2019-03-10 ENCOUNTER — Inpatient Hospital Stay (HOSPITAL_COMMUNITY): Payer: Medicare Other

## 2019-03-10 ENCOUNTER — Encounter (HOSPITAL_COMMUNITY)
Admission: EM | Disposition: A | Discharge status: Home / Self Care | Payer: Self-pay | Source: Home / Self Care | Attending: Internal Medicine

## 2019-03-10 ENCOUNTER — Inpatient Hospital Stay (HOSPITAL_COMMUNITY): Payer: Medicare Other | Admitting: Certified Registered"

## 2019-03-10 ENCOUNTER — Encounter (HOSPITAL_COMMUNITY): Payer: Self-pay | Admitting: Internal Medicine

## 2019-03-10 HISTORY — PX: HOT HEMOSTASIS: SHX5433

## 2019-03-10 HISTORY — PX: ESOPHAGOGASTRODUODENOSCOPY (EGD) WITH PROPOFOL: SHX5813

## 2019-03-10 LAB — COMPREHENSIVE METABOLIC PANEL
ALT: 168 U/L — ABNORMAL HIGH (ref 0–44)
AST: 340 U/L — ABNORMAL HIGH (ref 15–41)
Albumin: 2.4 g/dL — ABNORMAL LOW (ref 3.5–5.0)
Alkaline Phosphatase: 659 U/L — ABNORMAL HIGH (ref 38–126)
Anion gap: 13 (ref 5–15)
BUN: 11 mg/dL (ref 8–23)
CO2: 25 mmol/L (ref 22–32)
Calcium: 8.3 mg/dL — ABNORMAL LOW (ref 8.9–10.3)
Chloride: 96 mmol/L — ABNORMAL LOW (ref 98–111)
Creatinine, Ser: 1.01 mg/dL (ref 0.61–1.24)
GFR calc Af Amer: >60 mL/min (ref >60)
GFR calc non Af Amer: >60 mL/min (ref >60)
Glucose, Bld: 75 mg/dL (ref 70–99)
Potassium: 4.5 mmol/L (ref 3.5–5.1)
Sodium: 134 mmol/L — ABNORMAL LOW (ref 135–145)
Total Bilirubin: 1.7 mg/dL — ABNORMAL HIGH (ref 0.3–1.2)
Total Protein: 5.2 g/dL — ABNORMAL LOW (ref 6.5–8.1)

## 2019-03-10 LAB — CBC
HCT: 32.2 % — ABNORMAL LOW (ref 39.0–52.0)
HCT: 31.5 % — ABNORMAL LOW (ref 39.0–52.0)
HCT: 30.7 % — ABNORMAL LOW (ref 39.0–52.0)
HCT: 30.1 % — ABNORMAL LOW (ref 39.0–52.0)
Hemoglobin: 10.8 g/dL — ABNORMAL LOW (ref 13.0–17.0)
Hemoglobin: 10.6 g/dL — ABNORMAL LOW (ref 13.0–17.0)
Hemoglobin: 10.1 g/dL — ABNORMAL LOW (ref 13.0–17.0)
Hemoglobin: 10.3 g/dL — ABNORMAL LOW (ref 13.0–17.0)
MCH: 30.6 pg (ref 26.0–34.0)
MCH: 30.9 pg (ref 26.0–34.0)
MCH: 30.4 pg (ref 26.0–34.0)
MCH: 31.3 pg (ref 26.0–34.0)
MCHC: 33.5 g/dL (ref 30.0–36.0)
MCHC: 33.7 g/dL (ref 30.0–36.0)
MCHC: 32.9 g/dL (ref 30.0–36.0)
MCHC: 34.2 g/dL (ref 30.0–36.0)
MCV: 91.2 fL (ref 80.0–100.0)
MCV: 91.8 fL (ref 80.0–100.0)
MCV: 92.5 fL (ref 80.0–100.0)
MCV: 91.5 fL (ref 80.0–100.0)
Platelets: 307 10*3/uL (ref 150–400)
Platelets: 366 10*3/uL (ref 150–400)
Platelets: 294 10*3/uL (ref 150–400)
Platelets: 288 10*3/uL (ref 150–400)
RBC: 3.53 MIL/uL — ABNORMAL LOW (ref 4.22–5.81)
RBC: 3.43 MIL/uL — ABNORMAL LOW (ref 4.22–5.81)
RBC: 3.32 MIL/uL — ABNORMAL LOW (ref 4.22–5.81)
RBC: 3.29 MIL/uL — ABNORMAL LOW (ref 4.22–5.81)
RDW: 16.5 % — ABNORMAL HIGH (ref 11.5–15.5)
RDW: 16.6 % — ABNORMAL HIGH (ref 11.5–15.5)
RDW: 16.8 % — ABNORMAL HIGH (ref 11.5–15.5)
RDW: 16.8 % — ABNORMAL HIGH (ref 11.5–15.5)
WBC: 13.3 10*3/uL — ABNORMAL HIGH (ref 4.0–10.5)
WBC: 12.9 10*3/uL — ABNORMAL HIGH (ref 4.0–10.5)
WBC: 13.1 10*3/uL — ABNORMAL HIGH (ref 4.0–10.5)
WBC: 12.4 10*3/uL — ABNORMAL HIGH (ref 4.0–10.5)
nRBC: 0 % (ref 0.0–0.2)
nRBC: 0 % (ref 0.0–0.2)
nRBC: 0 % (ref 0.0–0.2)
nRBC: 0 % (ref 0.0–0.2)

## 2019-03-10 LAB — PREALBUMIN: Prealbumin: <5 mg/dL — ABNORMAL LOW (ref 18–38)

## 2019-03-10 LAB — HIV ANTIBODY (ROUTINE TESTING W REFLEX): HIV Screen 4th Generation wRfx: NONREACTIVE

## 2019-03-10 LAB — PHOSPHORUS: Phosphorus: 2.9 mg/dL (ref 2.5–4.6)

## 2019-03-10 LAB — MAGNESIUM: Magnesium: 2 mg/dL (ref 1.7–2.4)

## 2019-03-10 LAB — TSH: TSH: 7.41 u[IU]/mL — ABNORMAL HIGH (ref 0.350–4.500)

## 2019-03-10 SURGERY — ESOPHAGOGASTRODUODENOSCOPY (EGD) WITH PROPOFOL
Anesthesia: Monitor Anesthesia Care

## 2019-03-10 MED ORDER — SODIUM CHLORIDE 0.9 % IV SOLN: INTRAVENOUS | Status: DC

## 2019-03-10 MED ORDER — PROPOFOL 500 MG/50ML IV EMUL
INTRAVENOUS | Status: DC | PRN
  Administered 2019-03-10: 150 ug/kg/min via INTRAVENOUS

## 2019-03-10 MED ORDER — CHLORHEXIDINE GLUCONATE CLOTH 2 % EX PADS
6.0000 | MEDICATED_PAD | Freq: Every day | CUTANEOUS | Status: DC
  Administered 2019-03-11 – 2019-03-12 (×2): 6 via TOPICAL

## 2019-03-10 MED ORDER — IOHEXOL 300 MG/ML  SOLN
75.0000 mL | Freq: Once | INTRAMUSCULAR | Status: AC | PRN
  Administered 2019-03-10: 75 mL via INTRAVENOUS

## 2019-03-10 SURGICAL SUPPLY — 14 items

## 2019-03-10 NOTE — Progress Notes (Signed)
Initial Nutrition Assessment  DOCUMENTATION CODES:   Severe malnutrition in context of acute illness/injury  INTERVENTION:   When on clear liquids, can have Boost Breeze po TID, each supplement provides 250 kcal and 9 grams of protein.  When diet is advanced, add Ensure Enlive po TID, each supplement provides 350 kcal and 20 grams of protein.  NUTRITION DIAGNOSIS:   Severe Malnutrition related to acute illness(acute GI bleed, evidence of metastaic cancer to liver) as evidenced by severe fat depletion, moderate fat depletion, moderate muscle depletion, energy intake < or equal to 50% for > or equal to 5 days, percent weight loss(10% weight loss within 2 weeks).  GOAL:   Patient will meet greater than or equal to 90% of their needs  MONITOR:   Diet advancement, PO intake, Labs  REASON FOR ASSESSMENT:   Consult Assessment of nutrition requirement/status  ASSESSMENT:   64 yo male admitted with GI bleeding and abdominal pain. PMH includes laryngeal cancer, tobacco & alcohol abuse, CAD, HTN, HLD, HF.   Patient reports poor appetite for the past 2 weeks. He has lost ~15 lbs recently. Usual weight 165 lbs. Now down to 149 lbs. 10% weight loss within the past month is significant for the time frame. At home he was eating one sandwich per day. He has minimal difficulties with swallowing. He tries to chew foods thoroughly to help with swallowing. He has been drinking 1 bottle of Pure Protein liquid supplement per day (140 kcal and 30 gm protein) Intake has been meeting < 50% of estimated energy requirement for >/= 5 days.  Patient meets criteria for severe malnutrition in the context of acute illness.  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   Currently NPO; awaiting GI consultation. S/P bedside swallow evaluation this morning with SLP. Patient is chronically at risk for aspiration with history of radiation to larynx. Thickened liquids not required at this time.   Labs  reviewed. Sodium 134 (L), Prealbumin < 5 (L) Medications reviewed and include MVI, thiamine.   NUTRITION - FOCUSED PHYSICAL EXAM:    Most Recent Value  Orbital Region  Severe depletion  Upper Arm Region  Severe depletion  Thoracic and Lumbar Region  Mild depletion  Buccal Region  Moderate depletion  Temple Region  Mild depletion  Clavicle Bone Region  Moderate depletion  Clavicle and Acromion Bone Region  Moderate depletion  Scapular Bone Region  Moderate depletion  Dorsal Hand  Moderate depletion  Patellar Region  Moderate depletion  Anterior Thigh Region  Moderate depletion  Posterior Calf Region  Moderate depletion  Edema (RD Assessment)  None  Hair  Reviewed  Eyes  Reviewed  Mouth  Reviewed  Skin  Reviewed  Nails  Reviewed       Diet Order:  Currently NPO Diet Order            Diet clear liquid Room service appropriate? Yes; Fluid consistency: Thin  Diet effective now              EDUCATION NEEDS:   Not appropriate for education at this time  Skin:  Skin Assessment: Reviewed RN Assessment  Last BM:  2/23  Height:   Ht Readings from Last 1 Encounters:  03/09/19 5\' 7"  (1.702 m)    Weight:   Wt Readings from Last 1 Encounters:  03/09/19 67.6 kg    Ideal Body Weight:  67.3 kg  BMI:  Body mass index is 23.34 kg/m.  Estimated Nutritional Needs:   Kcal:  2000-2200  Protein:  90-110 gm  Fluid:  1.8-2 L    Molli Barrows, RD, LDN, CNSC Please refer to [facility] for contact information.

## 2019-03-10 NOTE — Evaluation (Signed)
Occupational Therapy Evaluation Patient Details Name: [name] MRN: [id number] DOB: November 24, 1955 Today's Date: 03/10/2019    History of Present Illness Pt is a 64 year old man admitted 03/09/19 with 3 days of black vomiting and stools. PMH: laryngeal cancer s/p radiation, CAD, CABG, anxiety, remote hx of tobacco use, alcohol use, colostomy (now reversed).   Clinical Impression   Pt was independent prior to admission. Has not been able to eat in a few weeks and is now experiencing generalized weakness. He is overall functioning at a supervision to min guard assist level in ADL and ADL transfers. Likely to progress well as medical issues resolve. Pt with stable VS on RA throughout session. Will follow acutely.    Follow Up Recommendations  No OT follow up    Equipment Recommendations  None recommended by OT    Recommendations for Other Services       Precautions / Restrictions Precautions Precautions: Fall Restrictions Weight Bearing Restrictions: No      Mobility Bed Mobility Overal bed mobility: Independent                Transfers Overall transfer level: Needs assistance Equipment used: None Transfers: Sit to/from Stand Sit to Stand: Supervision         General transfer comment: slow to rise    Balance Overall balance assessment: Needs assistance   Sitting balance-Leahy Scale: Good       Standing balance-Leahy Scale: Fair                             ADL either performed or assessed with clinical judgement   ADL                                         General ADL Comments: overall functioning at a supervision to min guard assist level, generally weak     Vision Baseline Vision/History: Wears glasses Wears Glasses: At all times Patient Visual Report: No change from baseline       Perception     Praxis      Pertinent Vitals/Pain Pain Assessment: Faces Pain Score: 3  Pain Location: R side of abdomen Pain  Descriptors / Indicators: Aching Pain Intervention(s): Premedicated before session     Hand Dominance Right   Extremity/Trunk Assessment Upper Extremity Assessment Upper Extremity Assessment: Overall WFL for tasks assessed   Lower Extremity Assessment Lower Extremity Assessment: Defer to PT evaluation   Cervical / Trunk Assessment Cervical / Trunk Assessment: Normal   Communication Communication Communication: No difficulties(baseline hoarseness)   Cognition Arousal/Alertness: Awake/alert Behavior During Therapy: WFL for tasks assessed/performed Overall Cognitive Status: Within Functional Limits for tasks assessed                                 General Comments: somewhat slow, pt on pain meds   General Comments       Exercises     Shoulder Instructions      Home Living Family/patient expects to be discharged to:: Private residence Living Arrangements: Children(4 children age 66 to 68) Available Help at Discharge: Family;Available 24 hours/day Type of Home: House Home Access: Stairs to enter [employer] of Steps: 2   Home Layout: One level     Bathroom Shower/Tub: Hospital doctor  Toilet: Standard     Home Equipment: Walker - 2 wheels          Prior Functioning/Environment Level of Independence: Independent        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[employer], Architect        OT Problem List: Decreased strength;Impaired balance (sitting and/or standing);Pain      OT Treatment/Interventions: Self-care/ADL training    OT Goals(Current goals can be found in the care plan section) Acute Rehab OT Goals Patient Stated Goal: be able to eat OT Goal Formulation: With patient Time For Goal Achievement: 03/24/19 Potential to Achieve Goals: Good  OT Frequency: Min 2X/week   Barriers to D/C:            Co-evaluation              AM-PAC OT "6 Clicks" Daily Activity     Outcome Measure Help from another person  eating meals?: None Help from another person taking care of personal grooming?: A Little Help from another person toileting, which includes using toliet, bedpan, or urinal?: A Little Help from another person bathing (including washing, rinsing, drying)?: A Little Help from another person to put on and taking off regular upper body clothing?: None Help from another person to put on and taking off regular lower body clothing?: A Little 6 Click Score: 20   End of Session    Activity Tolerance: Patient tolerated treatment well Patient left: in bed;with call bell/phone within reach;with nursing/sitter in room;with family/visitor present  OT Visit Diagnosis: Pain;Muscle weakness (generalized) (M62.81);Unsteadiness on feet (R26.81)                Time: [id number] OT Time Calculation (min): 28 min Charges:  OT General Charges $OT Visit: 1 Visit OT Evaluation $OT Eval Low Complexity: 1 Low OT Treatments $Self Care/Home Management : 8-22 mins  Nestor Lewandowsky, OTR/L Acute Rehabilitation Services Pager: [phone] Office: [phone]  Malka So 03/10/2019, 12:49 PM

## 2019-03-10 NOTE — Progress Notes (Signed)
PT Cancellation Note  Patient Details Name: [name] MRN: [id number] DOB: 1955-11-19   Cancelled Treatment:    Reason Eval/Treat Not Completed: Patient at procedure or test/unavailable. PT to check back tomorrow.  Thanks,  Verdene Lennert, PT, DPT  Acute Rehabilitation [phone] pager [phone] office  @ [facility]: [phone]     Harvie Heck 03/10/2019, 2:33 PM

## 2019-03-10 NOTE — Evaluation (Signed)
Clinical/Bedside Swallow Evaluation Patient Details  Name: [name] MRN: [id number] Date of Birth: 12/06/1955  Today's Date: 03/10/2019 Time: SLP Start Time (ACUTE ONLY): 1034 SLP Stop Time (ACUTE ONLY): 1057 SLP Time Calculation (min) (ACUTE ONLY): 23 min  Past Medical History:  Past Medical History:  Diagnosis Date  . Acute encephalopathy, improved 06/20/2013  . Acute MI, troponin > 20, no obvious culprit vessel 06/20/2013  . Anxiety   . Arthritis   . At risk for sudden cardiac death, has lifevest at discharge 06/20/2013  . Carotid stenosis   . Coronary artery disease    s/p CABG 2006; stenting to vein graft 05-2013; NSTEMI 06-2013 in setting of PEA arrest  . Depression   . Difficulty swallowing solids    Pt unable to swallow solid foods  . Dizziness 06/20/2013  . Dyslipidemia 10/09/2013   [place] Study atorvastatin -eIRB # [id number] tablet Take 40 mg by mouth daily.   . Fever, possible aspiration-treated with antibiotics 06/20/2013  . GERD (gastroesophageal reflux disease)   . Heart failure, acute on chronic, systolic and diastolic ([place]) [id number]  . Heart murmur   . History of radiation therapy 09/05/14-10/23/14   larynx 70 Gy  . Hyperlipidemia   . Hypertension   . Hypothermia, induced, post arrest 06/20/2013  . Incisional hernia   . Ischemic cardiomyopathy    echo 06/2013 EF 10-15% (normal 01/2013)  . PAD (peripheral artery disease) ([place]) 10/09/2013   Occluded left common carotid and moderate right internal carotid artery stenosis. Previous stent to the left subclavian artery. Right iliac stent. Bilateral 60-99% renal artery stenosis    . Radiation-induced esophageal stricture   . Shock liver 06/20/2013  . Shortness of breath dyspnea    with exertion  . 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 Subclavian artery stenosis, left ([facility])    s/p stenting 2006  . Tobacco abuse    >100 pack year history   . Weakness due to cardiac arrest 06/20/2013  . Wears glasses    Past Surgical  History:  Past Surgical History:  Procedure Laterality Date  . CARDIAC CATHETERIZATION    . CAROTID ANGIOGRAPHY  07/10/2016   Procedure: Carotid Angiography;  Surgeon: Lorretta Harp, MD;  Location: [place] [facility];  Service: Cardiovascular;;  Rt. Carotid  . CAROTID PTA/STENT INTERVENTION N/A 07/10/2016   Procedure: Carotid PTA/Stent Intervention;  Surgeon: Lorretta Harp, MD;  Location: [place] [facility];  Service: Cardiovascular;  Laterality: N/A;  Rt. External Carotid  . COLON SURGERY    . COLONOSCOPY    . COLOSTOMY    . COLOSTOMY TAKEDOWN  06/13/2015  . COLOSTOMY TAKEDOWN N/A 06/13/2015   Procedure: COLOSTOMY TAKEDOWN;  Surgeon: Coralie Keens, MD;  Location: [place];  Service: General;  Laterality: N/A;  . CORONARY ARTERY BYPASS GRAFT  09/2004   SVG to LAD (at [facility])  . DIRECT LARYNGOSCOPY N/A 07/06/2014   Procedure: DIRECT LARYNGOSCOPY AND ESOPAGOSCOPY WITH BIOPSY;  Surgeon: [name], MD;  Location: [place];  Service: ENT;  Laterality: N/A;  . ESOPHAGOSCOPY WITH DILITATION N/A 10/31/2015   Procedure: ESOPHAGOSCOPY WITH DILITATION;  Surgeon: [name], MD;  Location: [place];  Service: ENT;  Laterality: N/A;  . ILIAC ARTERY STENT  07/24/2008   8x6 Smart nitinol self-expanding stent to origin of ilaci down into external iliac crossing the hypogastric artery (Dr. Adora Fridge)  . [place] REPAIR  06/13/2015  . LAPAROTOMY N/A 08/05/2014   Procedure: EXPLORATORY LAPAROTOMY WITH SIGMOIDOTOMY WITH COLECTOMY &  COLOSTOMY;  Surgeon: Coralie Keens, MD;  Location: [place];  Service: General;  Laterality: N/A;  . LEFT HEART CATHETERIZATION WITH CORONARY/GRAFT ANGIOGRAM N/A 06/15/2013   Procedure: LEFT HEART CATHETERIZATION WITH Beatrix Fetters;  Surgeon: Sinclair Grooms, MD;  Location: [facility] CATH LAB;  Service: Cardiovascular;  Laterality: N/A;  . LOWER EXTREMITY ARTERIAL DOPPLER  08/2008   right and left ABI - mild arterial insufficiency at rest; R CIA/stent with 50-69% narrowing, L  CIA with increased velocities (50-69% diameter reduction)  . NM MYOCAR PERF WALL MOTION  06/2008   persantine myoview - normal pattern of systolic thickening/wall motion/perfusion;  . PEG TUBE REMOVAL    . PERIPHERAL VASCULAR CATHETERIZATION  05/2013   stent to SVG - LAD vein graft [facility]  . PERIPHERAL VASCULAR CATHETERIZATION N/A 05/08/2015   Procedure: Aortic Arch Angiography;  Surgeon: Serafina Mitchell, MD;  Location: [place] [facility];  Service: Cardiovascular;  Laterality: N/A;  . PERIPHERAL VASCULAR CATHETERIZATION N/A 05/08/2015   Procedure: Carotid Angiography;  Surgeon: Serafina Mitchell, MD;  Location: [place] [facility];  Service: Cardiovascular;  Laterality: N/A;  . PERIPHERAL VASCULAR CATHETERIZATION  06/13/2015   Procedure: PORTA CATH REMOVAL;  Surgeon: Coralie Keens, MD;  Location: [place];  Service: General;;  . PORT-A-CATH REMOVAL  06/13/2015  . RENAL ARTERY DOPPLER  05/2008   right and left prox renal arteries with narrowing and increased velocities (60-99% diameter reduction)  . SUBCLAVIAN ANGIOGRAM Left 08/20/2004   8x66mm Genesis balloon mounted stent   HPI:  [name] is a 64 y.o. male with medical history significant of laryngeal cancer (2016), radiation 08/2014-10/2014 radiation tx, radiation induced esophageal stricture, GERD, tobacco and alcohol abuse, anxiety, carotid artery stenosis, remote hx of CAD, cardiac arrest 2015. Presented with  3 day of black vomit and black stools. Per chart pt has decreased amount of p.o. intake due to lack of appetite. Also reports has sometimes trouble swallowing but mainly if he chews his food good he does okay. Pt seen for one session with outpatient SLP 11/21/14.    Assessment / Plan / Recommendation Clinical Impression  Pt has history of radiation cancer 08/2014-10/2014 due to laryngeal cancer. His voice is chronically hoarse and mild-moderate neck fibrotic tissue greater on left. He dissolves his pills in water at baseline and reports  no difficulty masticating or difficulty swallowing solids "as long as I chew it up for a longer time into smaller pieces"). He is cognitively able to monitor what and how he eats for safest results. Pt only consumed water and pills dissolved in water with RN due to currentl GI bleed (on clear liquid diet). No changes in vocal quality, no coughing post prandial. SLP reviewed general precautions for pharyngeal and esophageal dysphagia (hx of esophageal stricture). Also educated pt re: history of radiation to larynx, he will chroncially be at risk for aspiration- pt understood. Per outpatient ST documentation from 2016 pt only went to one appointment. Continue thin clear liquids due to GI bleed per MD rec's. MD can upgrade when able. No further ST needed.     SLP Visit Diagnosis: Dysphagia, unspecified (R13.10)    Aspiration Risk  Mild aspiration risk    Diet Recommendation Thin liquid;Other (Comment)(clears per MD GI bleed; MD can progress)   Liquid Administration via: Cup;Straw Medication Administration: Other (Comment)(dissolve in water) Supervision: Patient able to self feed Compensations: Slow rate;Small sips/bites;Clear throat intermittently Postural Changes: Remain upright for at least 30 minutes after po intake;Seated upright  at 90 degrees    Other  Recommendations Oral Care Recommendations: Oral care BID   Follow up Recommendations None      Frequency and Duration            Prognosis        Swallow Study   General HPI: [name] is a 64 y.o. male with medical history significant of laryngeal cancer (2016), radiation 08/2014-10/2014 radiation tx, radiation induced esophageal stricture, GERD, tobacco and alcohol abuse, anxiety, carotid artery stenosis, remote hx of CAD, cardiac arrest 2015. Presented with  3 day of black vomit and black stools. Per chart pt has decreased amount of p.o. intake due to lack of appetite. Also reports has sometimes trouble swallowing but mainly if  he chews his food good he does okay. Pt seen for one session with outpatient SLP 11/21/14.  Type of Study: Bedside Swallow Evaluation Previous Swallow Assessment: (none) Diet Prior to this Study: Thin liquids(clears d/t bleed) Temperature Spikes Noted: No Respiratory Status: Nasal cannula History of Recent Intubation: No Behavior/Cognition: Alert;Cooperative;Pleasant mood Oral Cavity Assessment: Other (comment)("hairy tongue"- brown coloration) Oral Care Completed by SLP: No Oral Cavity - Dentition: Poor condition;Missing dentition Vision: Functional for self-feeding Self-Feeding Abilities: Able to feed self Patient Positioning: Upright in bed Baseline Vocal Quality: Hoarse;Other (comment)(baseline following laryngeal ca) Volitional Cough: Strong Volitional Swallow: Able to elicit    Oral/Motor/Sensory Function Overall Oral Motor/Sensory Function: Within functional limits   Ice Chips Ice chips: Not tested   Thin Liquid Thin Liquid: Within functional limits Presentation: Straw;Cup    Nectar Thick Nectar Thick Liquid: Not tested   Honey Thick Honey Thick Liquid: Not tested   Puree Puree: Not tested   Solid     Solid: Not tested(due to GI bleed)      Houston Siren 03/10/2019,11:15 AM  Orbie Pyo Colvin Caroli.Ed Risk analyst [phone] Office [phone]

## 2019-03-10 NOTE — Progress Notes (Signed)
PROGRESS NOTE                                                                                                                                                                                                             Patient Demographics:    [name], is a 64 y.o. male, DOB - [birth year], DT:[id number]  Admit date - 03/09/2019   Admitting Physician Toy Baker, MD  Outpatient Primary MD for the patient is [name], NP  LOS - 1   Chief Complaint  Patient presents with  . GI Bleeding  . Abdominal Pain       Brief Narrative    64 y.o. male with medical history significant of laryngeal cancer , post radiation, history of tobacco abuse, history of alcohol abuse (drinking 3 weeks ago ) anxiety, carotid artery stenosis, remote hx of CAD, cardiac arrest 2015. -Presents to ED secondary to complaints of black stools, vomiting, with 1 episodes of dark-colored vomiting, keep in ED significant for liver metastasis, admitted for further work-up.   Subjective:    [name] today denies any chest pain, reports some abdominal discomfort, no vomiting since admission.    Assessment  & Plan :    Active Problems:   Dyslipidemia   Chronic combined systolic and diastolic heart failure (HCC)   Squamous cell carcinoma of larynx (HCC)   Cancer associated pain   Protein calorie malnutrition (HCC)   Occlusion of left carotid artery   Coronary artery disease involving coronary bypass graft of native heart without angina pectoris   Anxiety   HLD (hyperlipidemia)   Hypertension   Tobacco abuse   Upper GI bleed   Elevated LFTs   Liver metastases (HCC)   Coffee-ground emesis -Suspicion for upper GI bleed, especially with history of alcohol consumption, unclear if he is having varices or not. -Management Per GI, plan for endoscopy today. -Continue with Protonix drip. -Monitor H&H closely.  Metastatic  liver disease/history of  squamous cell of the larynx -Following with oncology as an outpatient Dr. Shellee Milo regarding his larynx tumor, metastatic liver disease is a new findings, to follow with oncology as an outpatient regarding further work-up.  Transaminitis -Most likely related to liver metastasis  Cancer associated pain  -pain management as needed   Protein calorie malnutrition ([place])  -we will need nutritional evaluation   Given  some trouble swallowing will have speech pathology evaluate  Occlusion of left carotid artery  -hold off Plavix   Coronary artery disease involving coronary bypass graft of native heart without angina pectoris -  - Hold aspirin and Plavix hold off of   Coreg given soft blood pressures  Anxiety -resume home medications  History of alcohol  abuse states that he quit drinking 3 weeks ago.  Will monitor for any signs of withdrawal  Hypertension  -So far blood pressure is acceptable, continue to monitor off medication  COVID-19 Labs  No results for input(s): DDIMER, FERRITIN, LDH, CRP in the last 72 hours.  Lab Results  Component Value Date   [place] NEGATIVE 03/09/2019     Code Status : Full  Family Communication  : None at bedside  Disposition Plan  : Home  Barriers For Discharge : further work up for upper GI bleed  Consults  :  GI, D/W oncology  Procedures  : None  DVT Prophylaxis  :  SCD  Lab Results  Component Value Date   PLT 294 03/10/2019    Antibiotics  :   Anti-infectives (From admission, onward)   None        Objective:   Vitals:   03/10/19 0747 03/10/19 1322 03/10/19 1329 03/10/19 1504  BP: 111/68 128/76  125/83  Pulse: 96   97  Resp: 16 17  16   Temp: 98 F (36.7 C) (!) 97.3 F (36.3 C)  97.7 F (36.5 C)  TempSrc: Oral Tympanic  Temporal  SpO2: 100% 92% (!) 85% 100%  Weight:      Height:        Wt Readings from Last 3 Encounters:  03/09/19 67.6 kg  10/01/17 75.8 kg  08/31/17 75.8 kg     Intake/Output Summary  (Last 24 hours) at 03/10/2019 1519 Last data filed at 03/10/2019 1505 Gross per 24 hour  Intake 120 ml  Output 175 ml  Net -55 ml     Physical Exam  Awake Alert, Oriented X 3, No new F.N deficits, Normal affect, patient with Hoarse Symmetrical Chest wall movement, Good air movement bilaterally, CTAB RRR,No Gallops,Rubs or new Murmurs, No Parasternal Heave +ve B.Sounds, non tender, enlarged liver,  No rebound - guarding or rigidity. No Cyanosis, Clubbing or edema, No new Rash or bruise     Data Review:    CBC Recent Labs  Lab 03/09/19 1123 03/09/19 2217 03/10/19 0242 03/10/19 0527 03/10/19 0821  WBC 12.6* 12.7* 13.3* 12.9* 13.1*  HGB 11.3* 10.4* 10.8* 10.6* 10.1*  HCT 33.8* 30.6* 32.2* 31.5* 30.7*  PLT 321 285 307 366 294  MCV 91.8 90.3 91.2 91.8 92.5  MCH 30.7 30.7 30.6 30.9 30.4  MCHC 33.4 34.0 33.5 33.7 32.9  RDW 16.3* 16.2* 16.5* 16.6* 16.8*    Chemistries  Recent Labs  Lab 03/09/19 1123 03/10/19 0527  NA 131* 134*  K 4.8 4.5  CL 91* 96*  CO2 24 25  GLUCOSE 97 75  BUN 11 11  CREATININE 1.17 1.01  CALCIUM 8.6* 8.3*  MG  --  2.0  AST 453* 340*  ALT 205* 168*  ALKPHOS 802* 659*  BILITOT 2.0* 1.7*   ------------------------------------------------------------------------------------------------------------------ No results for input(s): CHOL, HDL, LDLCALC, TRIG, CHOLHDL, LDLDIRECT in the last 72 hours.  Lab Results  Component Value Date   HGBA1C 5.7 (H) 04/17/2015   ------------------------------------------------------------------------------------------------------------------ Recent Labs    03/10/19 0527  TSH 7.410*   ------------------------------------------------------------------------------------------------------------------ No results for input(s): VITAMINB12, FOLATE, FERRITIN, TIBC,  IRON, RETICCTPCT in the last 72 hours.  Coagulation profile Recent Labs  Lab 03/09/19 1123  INR 1.2    No results for input(s): DDIMER in the last  72 hours.  Cardiac Enzymes No results for input(s): CKMB, TROPONINI, MYOGLOBIN in the last 168 hours.  Invalid input(s): CK ------------------------------------------------------------------------------------------------------------------    Component Value Date/Time   BNP 780.2 (H) 08/05/2014 0115    Inpatient Medications  Scheduled Meds: . [MAR Hold] ALPRAZolam  0.5 mg Oral BID  . [MAR Hold] atorvastatin  80 mg Oral Daily  . [MAR Hold] buPROPion  100 mg Oral TID  . [MAR Hold] multivitamin with minerals  1 tablet Oral Daily  . [MAR Hold] pantoprazole  40 mg Intravenous Q12H  . [MAR Hold] thiamine injection  100 mg Intravenous Daily   Continuous Infusions: . sodium chloride    . pantoprozole (PROTONIX) infusion 8 mg/hr (03/10/19 0847)   PRN Meds:.[MAR Hold] acetaminophen **OR** [MAR Hold] acetaminophen, [MAR Hold] HYDROcodone-acetaminophen, [MAR Hold] ondansetron **OR** [MAR Hold] ondansetron (ZOFRAN) IV  Micro Results Recent Results (from the past 240 hour(s))  SARS CORONAVIRUS 2 (TAT 6-24 HRS) Nasopharyngeal Nasopharyngeal Swab     Status: None   Collection Time: 03/09/19  1:33 PM   Specimen: Nasopharyngeal Swab  Result Value Ref Range Status   SARS Coronavirus 2 NEGATIVE NEGATIVE Final    Comment: (NOTE) SARS-CoV-2 target nucleic acids are NOT DETECTED. The SARS-CoV-2 RNA is generally detectable in upper and lower respiratory specimens during the acute phase of infection. Negative results do not preclude SARS-CoV-2 infection, do not rule out co-infections with other pathogens, and should not be used as the sole basis for treatment or other patient management decisions. Negative results must be combined with clinical observations, patient history, and epidemiological information. The expected result is Negative. Fact Sheet for Patients: SugarRoll.be Fact Sheet for Healthcare Providers: https://www.woods-mathews.com/ This test  is not yet approved or cleared by the [place] FDA and  has been authorized for detection and/or diagnosis of SARS-CoV-2 by FDA under an Emergency Use Authorization (EUA). This EUA will remain  in effect (meaning this test can be used) for the duration of the COVID-19 declaration under Section 56 4(b)(1) of the Act, 21 U.S.C. section 360bbb-3(b)(1), unless the authorization is terminated or revoked sooner. Performed at [facility], [place] [address] [place], [place] [address]     Radiology Reports CT SOFT TISSUE NECK W CONTRAST  Result Date: 03/10/2019 CLINICAL DATA:  History of left laryngeal cancer post treatment. EXAM: CT NECK WITH CONTRAST TECHNIQUE: Multidetector CT imaging of the neck was performed using the standard protocol following the bolus administration of intravenous contrast. CONTRAST:  60mL OMNIPAQUE IOHEXOL 300 MG/ML  SOLN COMPARISON:  CT neck 01/29/2015 FINDINGS: Pharynx and larynx: Diffuse edema in the larynx bilaterally involving the vocal cords and aryepiglottic folds. This is relatively symmetric. No focal mass. This edema was present previously. Tongue and tonsils negative. Salivary glands: Atrophic submandibular glands bilaterally have progressed since 2017 compatible radiation change. Parotid gland negative bilaterally. Thyroid: No focal lesion. Lymph nodes: Negative for lymphadenopathy in the neck. Vascular: Diffuse atherosclerotic disease. Right carotid stent which appears patent. Left common carotid artery is occluded at the origin, chronic and unchanged. Stent in the proximal left subclavian artery which is patent however there is moderate stenosis above the stent. Both vertebral arteries patent with diffuse atherosclerotic calcification. Limited intracranial: Negative Visualized orbits: Not imaged Mastoids and visualized paranasal sinuses: Mild mucosal edema right maxillary sinus. Otherwise clear. Skeleton:  Cervical spondylosis without acute skeletal  abnormality. Median sternotomy. Upper chest: Chest findings reported separately Other: None IMPRESSION: Post treatment edema in the larynx bilaterally. No recurrent tumor or adenopathy in the neck. Severe atherosclerotic disease. Right carotid stent patent. Chronic occlusion left common carotid artery. Electronically Signed   By: Franchot Gallo M.D.   On: 03/10/2019 10:12   CT CHEST W CONTRAST  Result Date: 03/10/2019 CLINICAL DATA:  Head and neck cancer staging, left laryngeal cancer EXAM: CT CHEST WITH CONTRAST TECHNIQUE: Multidetector CT imaging of the chest was performed during intravenous contrast administration. CONTRAST:  7mL OMNIPAQUE IOHEXOL 300 MG/ML  SOLN COMPARISON:  PET-CT, 02/14/2015, CT abdomen pelvis, 03/09/2019 FINDINGS: Cardiovascular: Aortic atherosclerosis. Stent of the left subclavian artery origin. The included portion of the left common carotid artery is occluded from the origin. Normal heart size. Extensive 3 vessel coronary artery calcifications and/or stents. Status post median sternotomy and CABG. No pericardial effusion. Mediastinum/Nodes: New, bulky mediastinal lymph nodes, largest pretracheal node measuring 3.8 x 2.6 cm (series 3, image 60) thyroid gland, trachea, and esophagus demonstrate no significant findings. Lungs/Pleura: Moderate centrilobular emphysema. Diffuse bilateral bronchial wall thickening. Clustered centrilobular and tree-in-bud pulmonary nodules of the posterior right upper lobe (series 4, image 47). Small bilateral pleural effusions and associated atelectasis or consolidation. No pleural effusion or pneumothorax. Upper Abdomen: No acute abnormality. Very bulky, heterogeneous, and nodular appearance of the liver with trace ascites. Musculoskeletal: No chest wall mass or suspicious bone lesions identified. IMPRESSION: 1. New, bulky mediastinal lymph nodes, largest pretracheal node measuring 3.8 x 2.6 cm, consistent with metastatic disease. 2. Very bulky,  heterogeneous, and nodular appearance of the liver with trace ascites, better evaluated by prior CT of the abdomen and pelvis and consistent with metastatic disease. 3. Small bilateral pleural effusions and associated atelectasis or consolidation. 4. Clustered centrilobular and tree-in-bud pulmonary nodules of the posterior right upper lobe, consistent with atypical infection, particularly including atypical mycobacteria. 5.  Emphysema (ICD10-J43.9). 6. Coronary artery disease.  Aortic Atherosclerosis (ICD10-I70.0). Electronically Signed   By: Eddie Candle M.D.   On: 03/10/2019 10:24   CT ABDOMEN PELVIS W CONTRAST  Result Date: 03/09/2019 CLINICAL DATA:  Abdominal pain. EXAM: CT ABDOMEN AND PELVIS WITH CONTRAST TECHNIQUE: Multidetector CT imaging of the abdomen and pelvis was performed using the standard protocol following bolus administration of intravenous contrast. CONTRAST:  127mL OMNIPAQUE IOHEXOL 300 MG/ML  SOLN COMPARISON:  08/22/2014 FINDINGS: Lower chest: Mild atelectasis in the lung bases. No pleural effusion. Three-vessel coronary atherosclerosis. Hepatobiliary: New marked heterogeneous enhancement throughout the liver which is now diffusely enlarged and nodular. Unremarkable gallbladder. No biliary dilatation. Pancreas: Unremarkable. Spleen: Unremarkable. Adrenals/Urinary Tract: Unremarkable adrenal glands. Excreted contrast in the renal collecting systems limiting assessment for calculi. No hydronephrosis. Subcentimeter hypodensity in the upper pole of the left kidney, unchanged and too small to fully characterize. Excreted contrast in the ureters and bladder. Moderate bladder distension. Stomach/Bowel: The stomach is unremarkable. Sequelae of previous sigmoid colectomy are again identified with interval colostomy takedown and reanastomosis. There is no evidence of bowel obstruction. There is submucosal fat deposition in the ascending colon and hepatic flexure likely reflecting the sequelae of prior  inflammation. The appendix is unremarkable. Vascular/Lymphatic: Diffuse abdominal aortic atherosclerosis without aneurysm. No enlarged lymph nodes. Reproductive: Unremarkable prostate. Other: Fat containing left lower quadrant ventral hernia at the site of the prior colostomy. Small fat containing epigastric and left inguinal hernias. Trace perihepatic free fluid. Musculoskeletal: No acute osseous abnormality or suspicious osseous lesion. IMPRESSION:  1. New marked heterogeneity and enlargement of the liver consistent with diffuse metastases. 2. Fat containing left lower quadrant hernia at the site of a previous colostomy. 3. Small fat containing epigastric and left inguinal hernias. 4. Aortic Atherosclerosis (ICD10-I70.0). Electronically Signed   By: Logan Bores M.D.   On: 03/09/2019 17:37      Phillips Climes M.D on 03/10/2019 at 3:19 PM  Between 7am to 7pm - Pager - [phone]  After 7pm go to [email or web address] - password [facility]  [facility] -  Office  [phone]

## 2019-03-10 NOTE — Anesthesia Preprocedure Evaluation (Signed)
Anesthesia Evaluation  Patient identified by MRN, date of birth, ID band Patient awake    Reviewed: Allergy & Precautions, NPO status , Patient's Chart, lab work & pertinent test results  Airway Mallampati: II  TM Distance: >3 FB Neck ROM: Full    Dental  (+) Teeth Intact, Dental Advisory Given,    Pulmonary former smoker,     + decreased breath sounds      Cardiovascular hypertension,  Rhythm:Regular Rate:Normal     Neuro/Psych    GI/Hepatic   Endo/Other    Renal/GU      Musculoskeletal   Abdominal   Peds  Hematology   Anesthesia Other Findings   Reproductive/Obstetrics                             Anesthesia Physical Anesthesia Plan  ASA: III  Anesthesia Plan: MAC   Post-op Pain Management:    Induction: Intravenous  PONV Risk Score and Plan: Ondansetron and Propofol infusion  Airway Management Planned: Natural Airway and Nasal Cannula  Additional Equipment:   Intra-op Plan:   Post-operative Plan: Extubation in OR  Informed Consent: I have reviewed the patients History and Physical, chart, labs and discussed the procedure including the risks, benefits and alternatives for the proposed anesthesia with the patient or authorized representative who has indicated his/her understanding and acceptance.     Dental advisory given  Plan Discussed with: CRNA and Anesthesiologist  Anesthesia Plan Comments: (H/O hematemesis S/P laryngeal Ca, hoarseness, H/O difficult intubation. Plan MAC with glide scope available.  Roberts Gaudy)        Anesthesia Quick Evaluation

## 2019-03-10 NOTE — Anesthesia Procedure Notes (Signed)
Procedure Name: MAC Date/Time: 03/10/2019 2:42 PM Performed by: Imagene Riches, CRNA Pre-anesthesia Checklist: Patient identified, Emergency Drugs available, Suction available, Patient being monitored and Timeout performed Patient Re-evaluated:Patient Re-evaluated prior to induction Oxygen Delivery Method: Nasal cannula

## 2019-03-10 NOTE — Consult Note (Signed)
UNASSIGNED GI CONSULT  Reason for Consult: Coffee-ground emesis Referring Physician: [facility]  [name] HPI: This is a 64 year old male with a PMH of ETOH abuse, s/p laryngeal cancer, carotid artery stenosis and MI in 2015 admitted with coffee-ground emesis.  The patient reports that he was at home and felt nauseous from taking a laxative.  He started to have an acute episode of coffee-ground emesis as well as black stools.  In the ER his HGB was 10.1 g/dL, which is his baseline.  The patient denies any chest pain or SOB.  There is no prior history of this type of issue, but he does report having some problems with GERD.  Past Medical History:  Diagnosis Date  . Acute encephalopathy, improved 06/20/2013  . Acute MI, troponin > 20, no obvious culprit vessel 06/20/2013  . Anxiety   . Arthritis   . At risk for sudden cardiac death, has lifevest at discharge 06/20/2013  . Carotid stenosis   . Coronary artery disease    s/p CABG 2006; stenting to vein graft 05-2013; NSTEMI 06-2013 in setting of PEA arrest  . Depression   . Difficulty swallowing solids    Pt unable to swallow solid foods  . Dizziness 06/20/2013  . Dyslipidemia 10/09/2013   [place] Study atorvastatin -eIRB # [id number] tablet Take 40 mg by mouth daily.   . Fever, possible aspiration-treated with antibiotics 06/20/2013  . GERD (gastroesophageal reflux disease)   . Heart failure, acute on chronic, systolic and diastolic ([place]) [id number]  . Heart murmur   . History of radiation therapy 09/05/14-10/23/14   larynx 70 Gy  . Hyperlipidemia   . Hypertension   . Hypothermia, induced, post arrest 06/20/2013  . Incisional hernia   . Ischemic cardiomyopathy    echo 06/2013 EF 10-15% (normal 01/2013)  . PAD (peripheral artery disease) ([place]) 10/09/2013   Occluded left common carotid and moderate right internal carotid artery stenosis. Previous stent to the left subclavian artery. Right iliac stent. Bilateral 60-99% renal artery stenosis    .  Radiation-induced esophageal stricture   . Shock liver 06/20/2013  . Shortness of breath dyspnea    with exertion  . 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 Subclavian artery stenosis, left ([facility])    s/p stenting 2006  . Tobacco abuse    >100 pack year history   . Weakness due to cardiac arrest 06/20/2013  . Wears glasses     Past Surgical History:  Procedure Laterality Date  . CARDIAC CATHETERIZATION    . CAROTID ANGIOGRAPHY  07/10/2016   Procedure: Carotid Angiography;  Surgeon: Lorretta Harp, MD;  Location: [place] [facility];  Service: Cardiovascular;;  Rt. Carotid  . CAROTID PTA/STENT INTERVENTION N/A 07/10/2016   Procedure: Carotid PTA/Stent Intervention;  Surgeon: Lorretta Harp, MD;  Location: [place] [facility];  Service: Cardiovascular;  Laterality: N/A;  Rt. External Carotid  . COLON SURGERY    . COLONOSCOPY    . COLOSTOMY    . COLOSTOMY TAKEDOWN  06/13/2015  . COLOSTOMY TAKEDOWN N/A 06/13/2015   Procedure: COLOSTOMY TAKEDOWN;  Surgeon: Coralie Keens, MD;  Location: [place];  Service: General;  Laterality: N/A;  . CORONARY ARTERY BYPASS GRAFT  09/2004   SVG to LAD (at [facility])  . DIRECT LARYNGOSCOPY N/A 07/06/2014   Procedure: DIRECT LARYNGOSCOPY AND ESOPAGOSCOPY WITH BIOPSY;  Surgeon: [name], MD;  Location: [place];  Service: ENT;  Laterality: N/A;  . ESOPHAGOSCOPY WITH DILITATION N/A 10/31/2015  Procedure: ESOPHAGOSCOPY WITH DILITATION;  Surgeon: [name], MD;  Location: [place];  Service: ENT;  Laterality: N/A;  . ILIAC ARTERY STENT  07/24/2008   8x6 Smart nitinol self-expanding stent to origin of ilaci down into external iliac crossing the hypogastric artery (Dr. Adora Fridge)  . [place] REPAIR  06/13/2015  . LAPAROTOMY N/A 08/05/2014   Procedure: EXPLORATORY LAPAROTOMY WITH SIGMOIDOTOMY WITH COLECTOMY & COLOSTOMY;  Surgeon: Coralie Keens, MD;  Location: [place];  Service: General;  Laterality: N/A;  . LEFT HEART CATHETERIZATION WITH CORONARY/GRAFT  ANGIOGRAM N/A 06/15/2013   Procedure: LEFT HEART CATHETERIZATION WITH Beatrix Fetters;  Surgeon: Sinclair Grooms, MD;  Location: [facility] C[facility] LAB;  Service: Cardiovascular;  Laterality: N/A;  . LOWER EXTREMITY ARTERIAL DOPPLER  08/2008   right and left ABI - mild arterial insufficiency at rest; R CIA/stent with 50-69% narrowing, L CIA with increased velocities (50-69% diameter reduction)  . NM MYOCAR PERF WALL MOTION  06/2008   persantine myoview - normal pattern of systolic thickening/wall motion/perfusion;  . PEG TUBE REMOVAL    . PERIPHERAL VASCULAR CATHETERIZATION  05/2013   stent to SVG - LAD vein graft at [facility]  . PERIPHERAL VASCULAR CATHETERIZATION N/A 05/08/2015   Procedure: Aortic Arch Angiography;  Surgeon: Serafina Mitchell, MD;  Location: [place] [facility] LAB;  Service: Cardiovascular;  Laterality: N/A;  . PERIPHERAL VASCULAR CATHETERIZATION N/A 05/08/2015   Procedure: Carotid Angiography;  Surgeon: Serafina Mitchell, MD;  Location: [place] CV LAB;  Service: Cardiovascular;  Laterality: N/A;  . PERIPHERAL VASCULAR CATHETERIZATION  06/13/2015   Procedure: PORTA CATH REMOVAL;  Surgeon: Coralie Keens, MD;  Location: [place];  Service: General;;  . PORT-A-CATH REMOVAL  06/13/2015  . RENAL ARTERY DOPPLER  05/2008   right and left prox renal arteries with narrowing and increased velocities (60-99% diameter reduction)  . SUBCLAVIAN ANGIOGRAM Left 08/20/2004   8x69mm Genesis balloon mounted stent    Family History  Problem Relation Age of Onset  . Heart disease Maternal Aunt   . Heart attack Mother   . Stroke Mother   . Cancer Father   . Stroke Sister   . Heart Problems Sister     Social History:  reports that he quit smoking about 5 years ago. His smoking use included cigarettes. He has a 180.00 pack-year smoking history. He has quit using smokeless tobacco. He reports current alcohol use of about 21.0 standard drinks of alcohol per week. He reports that he does not use  drugs.  Allergies:  Allergies  Allergen Reactions  . Cetuximab Anaphylaxis    Cardiac arrest 08/02/2014    Medications:  Scheduled: . ALPRAZolam  0.5 mg Oral BID  . atorvastatin  80 mg Oral Daily  . buPROPion  100 mg Oral TID  . multivitamin with minerals  1 tablet Oral Daily  . [START ON 03/13/2019] pantoprazole  40 mg Intravenous Q12H  . thiamine injection  100 mg Intravenous Daily   Continuous: . pantoprozole (PROTONIX) infusion 8 mg/hr (03/10/19 0847)    Results for orders placed or performed during the hospital encounter of 03/09/19 (from the past 24 hour(s))  CBC     Status: Abnormal   Collection Time: 03/09/19 10:17 PM  Result Value Ref Range   WBC 12.7 (H) 4.0 - 10.5 K/uL   RBC 3.39 (L) 4.22 - 5.81 MIL/uL   Hemoglobin 10.4 (L) 13.0 - 17.0 g/dL   HCT 30.6 (L) 39.0 - 52.0 %   MCV 90.3 80.0 -  100.0 fL   MCH 30.7 26.0 - 34.0 pg   MCHC 34.0 30.0 - 36.0 g/dL   RDW 16.2 (H) 11.5 - 15.5 %   Platelets 285 150 - 400 K/uL   nRBC 0.0 0.0 - 0.2 %  CBC     Status: Abnormal   Collection Time: 03/10/19  2:42 AM  Result Value Ref Range   WBC 13.3 (H) 4.0 - 10.5 K/uL   RBC 3.53 (L) 4.22 - 5.81 MIL/uL   Hemoglobin 10.8 (L) 13.0 - 17.0 g/dL   HCT 32.2 (L) 39.0 - 52.0 %   MCV 91.2 80.0 - 100.0 fL   MCH 30.6 26.0 - 34.0 pg   MCHC 33.5 30.0 - 36.0 g/dL   RDW 16.5 (H) 11.5 - 15.5 %   Platelets 307 150 - 400 K/uL   nRBC 0.0 0.0 - 0.2 %  Magnesium     Status: None   Collection Time: 03/10/19  5:27 AM  Result Value Ref Range   Magnesium 2.0 1.7 - 2.4 mg/dL  Phosphorus     Status: None   Collection Time: 03/10/19  5:27 AM  Result Value Ref Range   Phosphorus 2.9 2.5 - 4.6 mg/dL  TSH     Status: Abnormal   Collection Time: 03/10/19  5:27 AM  Result Value Ref Range   TSH 7.410 (H) 0.350 - 4.500 uIU/mL  Comprehensive metabolic panel     Status: Abnormal   Collection Time: 03/10/19  5:27 AM  Result Value Ref Range   Sodium 134 (L) 135 - 145 mmol/L   Potassium 4.5 3.5 - 5.1  mmol/L   Chloride 96 (L) 98 - 111 mmol/L   CO2 25 22 - 32 mmol/L   Glucose, Bld 75 70 - 99 mg/dL   BUN 11 8 - 23 mg/dL   Creatinine, Ser 1.01 0.61 - 1.24 mg/dL   Calcium 8.3 (L) 8.9 - 10.3 mg/dL   Total Protein 5.2 (L) 6.5 - 8.1 g/dL   Albumin 2.4 (L) 3.5 - 5.0 g/dL   AST 340 (H) 15 - 41 U/L   ALT 168 (H) 0 - 44 U/L   Alkaline Phosphatase 659 (H) 38 - 126 U/L   Total Bilirubin 1.7 (H) 0.3 - 1.2 mg/dL   GFR calc non Af Amer >60 >60 mL/min   GFR calc Af Amer >60 >60 mL/min   Anion gap 13 5 - 15  CBC     Status: Abnormal   Collection Time: 03/10/19  5:27 AM  Result Value Ref Range   WBC 12.9 (H) 4.0 - 10.5 K/uL   RBC 3.43 (L) 4.22 - 5.81 MIL/uL   Hemoglobin 10.6 (L) 13.0 - 17.0 g/dL   HCT 31.5 (L) 39.0 - 52.0 %   MCV 91.8 80.0 - 100.0 fL   MCH 30.9 26.0 - 34.0 pg   MCHC 33.7 30.0 - 36.0 g/dL   RDW 16.6 (H) 11.5 - 15.5 %   Platelets 366 150 - 400 K/uL   nRBC 0.0 0.0 - 0.2 %  HIV Antibody (routine testing w rflx)     Status: None   Collection Time: 03/10/19  5:27 AM  Result Value Ref Range   HIV Screen 4th Generation wRfx NON REACTIVE NON REACTIVE  CBC     Status: Abnormal   Collection Time: 03/10/19  8:21 AM  Result Value Ref Range   WBC 13.1 (H) 4.0 - 10.5 K/uL   RBC 3.32 (L) 4.22 - 5.81 MIL/uL   Hemoglobin 10.1 (  L) 13.0 - 17.0 g/dL   HCT 30.7 (L) 39.0 - 52.0 %   MCV 92.5 80.0 - 100.0 fL   MCH 30.4 26.0 - 34.0 pg   MCHC 32.9 30.0 - 36.0 g/dL   RDW 16.8 (H) 11.5 - 15.5 %   Platelets 294 150 - 400 K/uL   nRBC 0.0 0.0 - 0.2 %  Prealbumin     Status: Abnormal   Collection Time: 03/10/19  8:21 AM  Result Value Ref Range   Prealbumin <5 (L) 18 - 38 mg/dL     CT SOFT TISSUE NECK W CONTRAST  Result Date: 03/10/2019 CLINICAL DATA:  History of left laryngeal cancer post treatment. EXAM: CT NECK WITH CONTRAST TECHNIQUE: Multidetector CT imaging of the neck was performed using the standard protocol following the bolus administration of intravenous contrast. CONTRAST:  15mL  OMNIPAQUE IOHEXOL 300 MG/ML  SOLN COMPARISON:  CT neck 01/29/2015 FINDINGS: Pharynx and larynx: Diffuse edema in the larynx bilaterally involving the vocal cords and aryepiglottic folds. This is relatively symmetric. No focal mass. This edema was present previously. Tongue and tonsils negative. Salivary glands: Atrophic submandibular glands bilaterally have progressed since 2017 compatible radiation change. Parotid gland negative bilaterally. Thyroid: No focal lesion. Lymph nodes: Negative for lymphadenopathy in the neck. Vascular: Diffuse atherosclerotic disease. Right carotid stent which appears patent. Left common carotid artery is occluded at the origin, chronic and unchanged. Stent in the proximal left subclavian artery which is patent however there is moderate stenosis above the stent. Both vertebral arteries patent with diffuse atherosclerotic calcification. Limited intracranial: Negative Visualized orbits: Not imaged Mastoids and visualized paranasal sinuses: Mild mucosal edema right maxillary sinus. Otherwise clear. Skeleton: Cervical spondylosis without acute skeletal abnormality. Median sternotomy. Upper chest: Chest findings reported separately Other: None IMPRESSION: Post treatment edema in the larynx bilaterally. No recurrent tumor or adenopathy in the neck. Severe atherosclerotic disease. Right carotid stent patent. Chronic occlusion left common carotid artery. Electronically Signed   By: Franchot Gallo M.D.   On: 03/10/2019 10:12   CT CHEST W CONTRAST  Result Date: 03/10/2019 CLINICAL DATA:  Head and neck cancer staging, left laryngeal cancer EXAM: CT CHEST WITH CONTRAST TECHNIQUE: Multidetector CT imaging of the chest was performed during intravenous contrast administration. CONTRAST:  10mL OMNIPAQUE IOHEXOL 300 MG/ML  SOLN COMPARISON:  PET-CT, 02/14/2015, CT abdomen pelvis, 03/09/2019 FINDINGS: Cardiovascular: Aortic atherosclerosis. Stent of the left subclavian artery origin. The included  portion of the left common carotid artery is occluded from the origin. Normal heart size. Extensive 3 vessel coronary artery calcifications and/or stents. Status post median sternotomy and CABG. No pericardial effusion. Mediastinum/Nodes: New, bulky mediastinal lymph nodes, largest pretracheal node measuring 3.8 x 2.6 cm (series 3, image 60) thyroid gland, trachea, and esophagus demonstrate no significant findings. Lungs/Pleura: Moderate centrilobular emphysema. Diffuse bilateral bronchial wall thickening. Clustered centrilobular and tree-in-bud pulmonary nodules of the posterior right upper lobe (series 4, image 47). Small bilateral pleural effusions and associated atelectasis or consolidation. No pleural effusion or pneumothorax. Upper Abdomen: No acute abnormality. Very bulky, heterogeneous, and nodular appearance of the liver with trace ascites. Musculoskeletal: No chest wall mass or suspicious bone lesions identified. IMPRESSION: 1. New, bulky mediastinal lymph nodes, largest pretracheal node measuring 3.8 x 2.6 cm, consistent with metastatic disease. 2. Very bulky, heterogeneous, and nodular appearance of the liver with trace ascites, better evaluated by prior CT of the abdomen and pelvis and consistent with metastatic disease. 3. Small bilateral pleural effusions and associated atelectasis or consolidation. 4.  Clustered centrilobular and tree-in-bud pulmonary nodules of the posterior right upper lobe, consistent with atypical infection, particularly including atypical mycobacteria. 5.  Emphysema (ICD10-J43.9). 6. Coronary artery disease.  Aortic Atherosclerosis (ICD10-I70.0). Electronically Signed   By: Eddie Candle M.D.   On: 03/10/2019 10:24   CT ABDOMEN PELVIS W CONTRAST  Result Date: 03/09/2019 CLINICAL DATA:  Abdominal pain. EXAM: CT ABDOMEN AND PELVIS WITH CONTRAST TECHNIQUE: Multidetector CT imaging of the abdomen and pelvis was performed using the standard protocol following bolus administration  of intravenous contrast. CONTRAST:  134mL OMNIPAQUE IOHEXOL 300 MG/ML  SOLN COMPARISON:  08/22/2014 FINDINGS: Lower chest: Mild atelectasis in the lung bases. No pleural effusion. Three-vessel coronary atherosclerosis. Hepatobiliary: New marked heterogeneous enhancement throughout the liver which is now diffusely enlarged and nodular. Unremarkable gallbladder. No biliary dilatation. Pancreas: Unremarkable. Spleen: Unremarkable. Adrenals/Urinary Tract: Unremarkable adrenal glands. Excreted contrast in the renal collecting systems limiting assessment for calculi. No hydronephrosis. Subcentimeter hypodensity in the upper pole of the left kidney, unchanged and too small to fully characterize. Excreted contrast in the ureters and bladder. Moderate bladder distension. Stomach/Bowel: The stomach is unremarkable. Sequelae of previous sigmoid colectomy are again identified with interval colostomy takedown and reanastomosis. There is no evidence of bowel obstruction. There is submucosal fat deposition in the ascending colon and hepatic flexure likely reflecting the sequelae of prior inflammation. The appendix is unremarkable. Vascular/Lymphatic: Diffuse abdominal aortic atherosclerosis without aneurysm. No enlarged lymph nodes. Reproductive: Unremarkable prostate. Other: Fat containing left lower quadrant ventral hernia at the site of the prior colostomy. Small fat containing epigastric and left inguinal hernias. Trace perihepatic free fluid. Musculoskeletal: No acute osseous abnormality or suspicious osseous lesion. IMPRESSION: 1. New marked heterogeneity and enlargement of the liver consistent with diffuse metastases. 2. Fat containing left lower quadrant hernia at the site of a previous colostomy. 3. Small fat containing epigastric and left inguinal hernias. 4. Aortic Atherosclerosis (ICD10-I70.0). Electronically Signed   By: Logan Bores M.D.   On: 03/09/2019 17:37    ROS:  As stated above in the HPI otherwise  negative.  Blood pressure 123/88, pulse 97, temperature 97.7 F (36.5 C), temperature source Temporal, resp. rate 18, height 5\' 7"  (1.702 m), weight 67.6 kg, SpO2 96 %.    PE: Gen: NAD, Alert and Oriented Lungs: CTA Bilaterally CV: RRR without M/G/R ABM: Soft, NTND, +BS Ext: No C/C/E  Assessment/Plan: 1) Coffee-ground emesis. 2) Melena. 3) History of laryngeal cancer.   The patient appears to have an upper GI source of bleeding.  He is stable and he is not hypotensive.    Plan: 1) EGD today.  Sidni Fusco D 03/10/2019, 4:00 PM

## 2019-03-10 NOTE — Op Note (Signed)
[facility] Patient Name: [name] Procedure Date : 03/10/2019 MRN: [id number] Attending MD: Carol Ada , MD Date of Birth: 1955-06-10 CSN: [id number] Age: 64 Admit Type: Inpatient Procedure:                Upper GI endoscopy Indications:              Coffee-ground emesis Providers:                Carol Ada, MD, Vista Lawman, RN, Lina Sar,                            Ivor Messier Huggnins CRNA Referring MD:              Medicines:                Propofol per Anesthesia Complications:            No immediate complications. Estimated Blood Loss:     Estimated blood loss: none. Procedure:                Pre-Anesthesia Assessment:                           - Prior to the procedure, a History and Physical                            was performed, and patient medications and                            allergies were reviewed. The patient's tolerance of                            previous anesthesia was also reviewed. The risks                            and benefits of the procedure and the sedation                            options and risks were discussed with the patient.                            All questions were answered, and informed consent                            was obtained. Prior Anticoagulants: The patient has                            taken no previous anticoagulant or antiplatelet                            agents. ASA Grade Assessment: III - A patient with                            severe systemic disease. After reviewing the risks  and benefits, the patient was deemed in                            satisfactory condition to undergo the procedure.                           - Sedation was administered by an anesthesia                            professional. Deep sedation was attained.                           After obtaining informed consent, the endoscope was                            passed under direct  vision. Throughout the                            procedure, the patient's blood pressure, pulse, and                            oxygen saturations were monitored continuously. The                            [id number]) Olympus gastroscope was                            introduced through the mouth, and advanced to the                            duodenal bulb. The upper GI endoscopy was                            technically difficult and complex due to abnormal                            anatomy. The patient tolerated the procedure well. Scope In: Scope Out: Findings:      One benign-appearing, intrinsic severe (stenosis; an endoscope cannot       pass) stenosis was found at the cricopharyngeus. This stenosis measured       5 mm (inner diameter) x 1 cm (in length). The stenosis was traversed       after downsizing scope.      A single 1 mm angioectasia with bleeding was found in the gastric body.       Coagulation for hemostasis using monopolar probe was successful.       Estimated blood loss: none.      The standard EGD scope was not able pass through the radiation stricture       at the UES. An ultraslim EGD scope was not available. The decision was       made to use a bronchoscope. The bronchoscope was able to traverse the       stenosis easily. In the gastric lumen there was evidence of a clot in       the body of the stomach. Lavage removed the clot  and there was evidence       of active bleeding. The bleeding was isolated to a small bleeding AVM.       APC was employed and it was successful with arresting the bleeding. Impression:               - Benign-appearing esophageal stenosis.                           - A single bleeding angioectasia in the stomach.                            Treated with a monopolar probe.                           - No specimens collected. Recommendation:           - Return patient to hospital ward for ongoing care.                           -  Clear liquid diet.                           - Continue present medications.                           - Follow HGB. Procedure Code(s):        --- Professional ---                           [phone], Esophagogastroduodenoscopy, flexible,                            transoral; with control of bleeding, any method Diagnosis Code(s):        --- Professional ---                           K22.2, Esophageal obstruction                           K31.811, Angiodysplasia of stomach and duodenum                            with bleeding                           K92.0, Hematemesis CPT copyright 2019 [employer]. All rights reserved. The codes documented in this report are preliminary and upon coder review may  be revised to meet current compliance requirements. Carol Ada, MD Carol Ada, MD 03/10/2019 3:29:18 PM This report has been signed electronically. Number of Addenda: 0

## 2019-03-10 NOTE — Transfer of Care (Signed)
Immediate Anesthesia Transfer of Care Note  Patient: [name]  Procedure(s) Performed: ESOPHAGOGASTRODUODENOSCOPY (EGD) WITH PROPOFOL (N/A ) HOT HEMOSTASIS (ARGON PLASMA COAGULATION/BICAP) (N/A )  Patient Location: Endoscopy Unit  Anesthesia Type:MAC  Level of Consciousness: drowsy  Airway & Oxygen Therapy: Patient Spontanous Breathing and Patient connected to nasal cannula oxygen  Post-op Assessment: Report given to RN and Post -op Vital signs reviewed and stable  Post vital signs: Reviewed and stable  Last Vitals:  Vitals Value Taken Time  BP    Temp    Pulse 97 03/10/19 1503  Resp 16 03/10/19 1503  SpO2 100 % 03/10/19 1503  Vitals shown include unvalidated device data.  Last Pain:  Vitals:   03/10/19 1322  TempSrc: Tympanic  PainSc: 4          Complications: No apparent anesthesia complications

## 2019-03-10 NOTE — Anesthesia Postprocedure Evaluation (Signed)
Anesthesia Post Note  Patient: [name]  Procedure(s) Performed: ESOPHAGOGASTRODUODENOSCOPY (EGD) WITH PROPOFOL (N/A ) HOT HEMOSTASIS (ARGON PLASMA COAGULATION/BICAP) (N/A )     Patient location during evaluation: Endoscopy Anesthesia Type: MAC Level of consciousness: awake and alert Pain management: pain level controlled Vital Signs Assessment: post-procedure vital signs reviewed and stable Respiratory status: spontaneous breathing, nonlabored ventilation, respiratory function stable and patient connected to nasal cannula oxygen Cardiovascular status: stable and blood pressure returned to baseline Postop Assessment: no apparent nausea or vomiting Anesthetic complications: no    Last Vitals:  Vitals:   03/10/19 1534 03/10/19 1730  BP: 123/88 123/72  Pulse: 97 99  Resp: 18 16  Temp:  36.7 C  SpO2: 96% 95%    Last Pain:  Vitals:   03/10/19 1730  TempSrc: Oral  PainSc:                  [name]

## 2019-03-11 ENCOUNTER — Telehealth: Payer: Self-pay | Admitting: Hematology and Oncology

## 2019-03-11 LAB — CBC
HCT: 28.5 % — ABNORMAL LOW (ref 39.0–52.0)
Hemoglobin: 9.7 g/dL — ABNORMAL LOW (ref 13.0–17.0)
MCH: 30.9 pg (ref 26.0–34.0)
MCHC: 34 g/dL (ref 30.0–36.0)
MCV: 90.8 fL (ref 80.0–100.0)
Platelets: 259 10*3/uL (ref 150–400)
RBC: 3.14 MIL/uL — ABNORMAL LOW (ref 4.22–5.81)
RDW: 16.9 % — ABNORMAL HIGH (ref 11.5–15.5)
WBC: 11.7 10*3/uL — ABNORMAL HIGH (ref 4.0–10.5)
nRBC: 0 % (ref 0.0–0.2)

## 2019-03-11 LAB — COMPREHENSIVE METABOLIC PANEL WITH GFR
ALT: 151 U/L — ABNORMAL HIGH (ref 0–44)
AST: 313 U/L — ABNORMAL HIGH (ref 15–41)
Albumin: 2.2 g/dL — ABNORMAL LOW (ref 3.5–5.0)
Alkaline Phosphatase: 581 U/L — ABNORMAL HIGH (ref 38–126)
Anion gap: 11 (ref 5–15)
BUN: 11 mg/dL (ref 8–23)
CO2: 25 mmol/L (ref 22–32)
Calcium: 8.3 mg/dL — ABNORMAL LOW (ref 8.9–10.3)
Chloride: 97 mmol/L — ABNORMAL LOW (ref 98–111)
Creatinine, Ser: 1.09 mg/dL (ref 0.61–1.24)
GFR calc Af Amer: >60 mL/min
GFR calc non Af Amer: >60 mL/min
Glucose, Bld: 83 mg/dL (ref 70–99)
Potassium: 4.3 mmol/L (ref 3.5–5.1)
Sodium: 133 mmol/L — ABNORMAL LOW (ref 135–145)
Total Bilirubin: 2 mg/dL — ABNORMAL HIGH (ref 0.3–1.2)
Total Protein: 5.1 g/dL — ABNORMAL LOW (ref 6.5–8.1)

## 2019-03-11 MED ORDER — BOOST / RESOURCE BREEZE PO LIQD CUSTOM
1.0000 | Freq: Three times a day (TID) | ORAL | Status: DC
  Administered 2019-03-11 – 2019-03-12 (×3): 1 via ORAL

## 2019-03-11 NOTE — Progress Notes (Signed)
PROGRESS NOTE                                                                                                                                                                                                             Patient Demographics:    [name], is a 64 y.o. male, DOB - 1955-06-28, [id number]  Admit date - 03/09/2019   Admitting Physician Toy Baker, MD  Outpatient Primary MD for the patient is Edward Beals, NP  LOS - 2   Chief Complaint  Patient presents with  . GI Bleeding  . Abdominal Pain       Brief Narrative    64 y.o. male with medical history significant of laryngeal cancer , post radiation, history of tobacco abuse, history of alcohol abuse (drinking 3 weeks ago ) anxiety, carotid artery stenosis, remote hx of CAD, cardiac arrest 2015. -Presents to ED secondary to complaints of black stools, vomiting, with 1 episodes of dark-colored vomiting, keep in ED significant for liver metastasis, admitted for further work-up.   Subjective:    [name] today denies any chest pain, reports some abdominal discomfort, no vomiting since admission.    Assessment  & Plan :    Active Problems:   Dyslipidemia   Chronic combined systolic and diastolic heart failure (HCC)   Squamous cell carcinoma of larynx (HCC)   Cancer associated pain   Protein calorie malnutrition (HCC)   Occlusion of left carotid artery   Coronary artery disease involving coronary bypass graft of native heart without angina pectoris   Anxiety   HLD (hyperlipidemia)   Hypertension   Tobacco abuse   Upper GI bleed   Elevated LFTs   Liver metastases (HCC)   Upper GI bleed -Patient presents with coffee-ground emesis, GI input greatly appreciated, status post endoscopy 2/25, significant for bleeding gastric AVM, she did require ablation. -Plan to monitor H&H closely per GI recommendation, will need to monitor for next 24  hours. -Management Per GI, plan for endoscopy today. -Continue with Protonix drip. -Monitor H&H closely.  Metastatic  liver disease/history of squamous cell of the larynx -Following with oncology as an outpatient Dr. Alvy Bimler regarding his larynx tumor, metastatic liver disease is a new findings, to follow with oncology as an outpatient regarding further work-up.  Likely he will need PET scan as an outpatient.  Transaminitis -  Most likely related to liver metastasis  Cancer associated pain  -pain management as needed   Severe protein calorie malnutrition ([place])  -continue with supplements  Occlusion of left carotid artery  -hold Plavix   Coronary artery disease involving coronary bypass graft of native heart without angina pectoris -  - Hold aspirin and Plavix hold off of   Coreg given soft blood pressures  Anxiety -resume home medications  History of alcohol  abuse states that he quit drinking 3 weeks ago.  Will monitor for any signs of withdrawal  Hypertension  -So far blood pressure is acceptable, continue to monitor off medication  COVID-19 Labs  No results for input(s): DDIMER, FERRITIN, LDH, CRP in the last 72 hours.  Lab Results  Component Value Date   Prue NEGATIVE 03/09/2019     Code Status : Full  Family Communication  : None at bedside  Disposition Plan  : Home tomorrow if Hgb is stable  Barriers For Discharge : Will need monitoring for next 24 hours to see if Hgb remains stable  Consults  :  GI, D/W oncology  Procedures  : None  DVT Prophylaxis  :  SCD  Lab Results  Component Value Date   PLT 259 03/11/2019    Antibiotics  :   Anti-infectives (From admission, onward)   None        Objective:   Vitals:   03/10/19 1730 03/10/19 2135 03/11/19 0749 03/11/19 1635  BP: 123/72 121/70 118/75 114/72  Pulse: 99 96 97 99  Resp: 16 17 16 19   Temp: 98 F (36.7 C) 98.2 F (36.8 C) 98.1 F (36.7 C) 98.3 F (36.8 C)  TempSrc: Oral  Oral    SpO2: 95% 95% 91% 90%  Weight:      Height:        Wt Readings from Last 3 Encounters:  03/09/19 67.6 kg  10/01/17 75.8 kg  08/31/17 75.8 kg     Intake/Output Summary (Last 24 hours) at 03/11/2019 1716 Last data filed at 03/10/2019 1900 Gross per 24 hour  Intake 240 ml  Output --  Net 240 ml     Physical Exam  Awake Alert, Oriented X 3, No new F.N deficits, Normal affect Symmetrical Chest wall movement, Good air movement bilaterally, CTAB RRR,No Gallops,Rubs or new Murmurs, No Parasternal Heave +ve B.Sounds, Abd Soft, enlarged liver, No rebound - guarding or rigidity. No Cyanosis, Clubbing or edema, No new Rash or bruise       Data Review:    CBC Recent Labs  Lab 03/10/19 0242 03/10/19 0527 03/10/19 0821 03/10/19 1839 03/11/19 0254  WBC 13.3* 12.9* 13.1* 12.4* 11.7*  HGB 10.8* 10.6* 10.1* 10.3* 9.7*  HCT 32.2* 31.5* 30.7* 30.1* 28.5*  PLT 307 366 294 288 259  MCV 91.2 91.8 92.5 91.5 90.8  MCH 30.6 30.9 30.4 31.3 30.9  MCHC 33.5 33.7 32.9 34.2 34.0  RDW 16.5* 16.6* 16.8* 16.8* 16.9*    Chemistries  Recent Labs  Lab 03/09/19 1123 03/10/19 0527 03/11/19 0254  NA 131* 134* 133*  K 4.8 4.5 4.3  CL 91* 96* 97*  CO2 24 25 25   GLUCOSE 97 75 83  BUN 11 11 11   CREATININE 1.17 1.01 1.09  CALCIUM 8.6* 8.3* 8.3*  MG  --  2.0  --   AST 453* 340* 313*  ALT 205* 168* 151*  ALKPHOS 802* 659* 581*  BILITOT 2.0* 1.7* 2.0*   ------------------------------------------------------------------------------------------------------------------ No results for input(s): CHOL, HDL, LDLCALC, TRIG, CHOLHDL,  LDLDIRECT in the last 72 hours.  Lab Results  Component Value Date   HGBA1C 5.7 (H) 04/17/2015   ------------------------------------------------------------------------------------------------------------------ Recent Labs    03/10/19 0527  TSH 7.410*    ------------------------------------------------------------------------------------------------------------------ No results for input(s): VITAMINB12, FOLATE, FERRITIN, TIBC, IRON, RETICCTPCT in the last 72 hours.  Coagulation profile Recent Labs  Lab 03/09/19 1123  INR 1.2    No results for input(s): DDIMER in the last 72 hours.  Cardiac Enzymes No results for input(s): CKMB, TROPONINI, MYOGLOBIN in the last 168 hours.  Invalid input(s): CK ------------------------------------------------------------------------------------------------------------------    Component Value Date/Time   BNP 780.2 (H) 08/05/2014 0115    Inpatient Medications  Scheduled Meds: . ALPRAZolam  0.5 mg Oral BID  . atorvastatin  80 mg Oral Daily  . buPROPion  100 mg Oral TID  . Chlorhexidine Gluconate Cloth  6 each Topical Daily  . feeding supplement  1 Container Oral TID BM  . multivitamin with minerals  1 tablet Oral Daily  . [START ON 03/13/2019] pantoprazole  40 mg Intravenous Q12H  . thiamine injection  100 mg Intravenous Daily   Continuous Infusions: . pantoprozole (PROTONIX) infusion 8 mg/hr (03/11/19 1051)   PRN Meds:.acetaminophen **OR** acetaminophen, HYDROcodone-acetaminophen, ondansetron **OR** ondansetron (ZOFRAN) IV  Micro Results Recent Results (from the past 240 hour(s))  SARS CORONAVIRUS 2 (TAT 6-24 HRS) Nasopharyngeal Nasopharyngeal Swab     Status: None   Collection Time: 03/09/19  1:33 PM   Specimen: Nasopharyngeal Swab  Result Value Ref Range Status   SARS Coronavirus 2 NEGATIVE NEGATIVE Final    Comment: (NOTE) SARS-CoV-2 target nucleic acids are NOT DETECTED. The SARS-CoV-2 RNA is generally detectable in upper and lower respiratory specimens during the acute phase of infection. Negative results do not preclude SARS-CoV-2 infection, do not rule out co-infections with other pathogens, and should not be used as the sole basis for treatment or other patient management  decisions. Negative results must be combined with clinical observations, patient history, and epidemiological information. The expected result is Negative. Fact Sheet for Patients: SugarRoll.be Fact Sheet for Healthcare Providers: https://www.woods-mathews.com/ This test is not yet approved or cleared by the [place] FDA and  has been authorized for detection and/or diagnosis of SARS-CoV-2 by FDA under an Emergency Use Authorization (EUA). This EUA will remain  in effect (meaning this test can be used) for the duration of the COVID-19 declaration under Section 56 4(b)(1) of the Act, 21 U.S.C. section 360bbb-3(b)(1), unless the authorization is terminated or revoked sooner. Performed at [facility], [place] [address] [place], [place] [address]     Radiology Reports CT SOFT TISSUE NECK W CONTRAST  Result Date: 03/10/2019 CLINICAL DATA:  History of left laryngeal cancer post treatment. EXAM: CT NECK WITH CONTRAST TECHNIQUE: Multidetector CT imaging of the neck was performed using the standard protocol following the bolus administration of intravenous contrast. CONTRAST:  70mL OMNIPAQUE IOHEXOL 300 MG/ML  SOLN COMPARISON:  CT neck 01/29/2015 FINDINGS: Pharynx and larynx: Diffuse edema in the larynx bilaterally involving the vocal cords and aryepiglottic folds. This is relatively symmetric. No focal mass. This edema was present previously. Tongue and tonsils negative. Salivary glands: Atrophic submandibular glands bilaterally have progressed since 2017 compatible radiation change. Parotid gland negative bilaterally. Thyroid: No focal lesion. Lymph nodes: Negative for lymphadenopathy in the neck. Vascular: Diffuse atherosclerotic disease. Right carotid stent which appears patent. Left common carotid artery is occluded at the origin, chronic and unchanged. Stent in the proximal left subclavian artery which is  patent however there is moderate stenosis  above the stent. Both vertebral arteries patent with diffuse atherosclerotic calcification. Limited intracranial: Negative Visualized orbits: Not imaged Mastoids and visualized paranasal sinuses: Mild mucosal edema right maxillary sinus. Otherwise clear. Skeleton: Cervical spondylosis without acute skeletal abnormality. Median sternotomy. Upper chest: Chest findings reported separately Other: None IMPRESSION: Post treatment edema in the larynx bilaterally. No recurrent tumor or adenopathy in the neck. Severe atherosclerotic disease. Right carotid stent patent. Chronic occlusion left common carotid artery. Electronically Signed   By: Franchot Gallo M.D.   On: 03/10/2019 10:12   CT CHEST W CONTRAST  Result Date: 03/10/2019 CLINICAL DATA:  Head and neck cancer staging, left laryngeal cancer EXAM: CT CHEST WITH CONTRAST TECHNIQUE: Multidetector CT imaging of the chest was performed during intravenous contrast administration. CONTRAST:  61mL OMNIPAQUE IOHEXOL 300 MG/ML  SOLN COMPARISON:  PET-CT, 02/14/2015, CT abdomen pelvis, 03/09/2019 FINDINGS: Cardiovascular: Aortic atherosclerosis. Stent of the left subclavian artery origin. The included portion of the left common carotid artery is occluded from the origin. Normal heart size. Extensive 3 vessel coronary artery calcifications and/or stents. Status post median sternotomy and CABG. No pericardial effusion. Mediastinum/Nodes: New, bulky mediastinal lymph nodes, largest pretracheal node measuring 3.8 x 2.6 cm (series 3, image 60) thyroid gland, trachea, and esophagus demonstrate no significant findings. Lungs/Pleura: Moderate centrilobular emphysema. Diffuse bilateral bronchial wall thickening. Clustered centrilobular and tree-in-bud pulmonary nodules of the posterior right upper lobe (series 4, image 47). Small bilateral pleural effusions and associated atelectasis or consolidation. No pleural effusion or pneumothorax. Upper Abdomen: No acute abnormality. Very bulky,  heterogeneous, and nodular appearance of the liver with trace ascites. Musculoskeletal: No chest wall mass or suspicious bone lesions identified. IMPRESSION: 1. New, bulky mediastinal lymph nodes, largest pretracheal node measuring 3.8 x 2.6 cm, consistent with metastatic disease. 2. Very bulky, heterogeneous, and nodular appearance of the liver with trace ascites, better evaluated by prior CT of the abdomen and pelvis and consistent with metastatic disease. 3. Small bilateral pleural effusions and associated atelectasis or consolidation. 4. Clustered centrilobular and tree-in-bud pulmonary nodules of the posterior right upper lobe, consistent with atypical infection, particularly including atypical mycobacteria. 5.  Emphysema (ICD10-J43.9). 6. Coronary artery disease.  Aortic Atherosclerosis (ICD10-I70.0). Electronically Signed   By: Eddie Candle M.D.   On: 03/10/2019 10:24   CT ABDOMEN PELVIS W CONTRAST  Result Date: 03/09/2019 CLINICAL DATA:  Abdominal pain. EXAM: CT ABDOMEN AND PELVIS WITH CONTRAST TECHNIQUE: Multidetector CT imaging of the abdomen and pelvis was performed using the standard protocol following bolus administration of intravenous contrast. CONTRAST:  185mL OMNIPAQUE IOHEXOL 300 MG/ML  SOLN COMPARISON:  08/22/2014 FINDINGS: Lower chest: Mild atelectasis in the lung bases. No pleural effusion. Three-vessel coronary atherosclerosis. Hepatobiliary: New marked heterogeneous enhancement throughout the liver which is now diffusely enlarged and nodular. Unremarkable gallbladder. No biliary dilatation. Pancreas: Unremarkable. Spleen: Unremarkable. Adrenals/Urinary Tract: Unremarkable adrenal glands. Excreted contrast in the renal collecting systems limiting assessment for calculi. No hydronephrosis. Subcentimeter hypodensity in the upper pole of the left kidney, unchanged and too small to fully characterize. Excreted contrast in the ureters and bladder. Moderate bladder distension. Stomach/Bowel: The  stomach is unremarkable. Sequelae of previous sigmoid colectomy are again identified with interval colostomy takedown and reanastomosis. There is no evidence of bowel obstruction. There is submucosal fat deposition in the ascending colon and hepatic flexure likely reflecting the sequelae of prior inflammation. The appendix is unremarkable. Vascular/Lymphatic: Diffuse abdominal aortic atherosclerosis without aneurysm. No enlarged lymph nodes. Reproductive: Unremarkable  prostate. Other: Fat containing left lower quadrant ventral hernia at the site of the prior colostomy. Small fat containing epigastric and left inguinal hernias. Trace perihepatic free fluid. Musculoskeletal: No acute osseous abnormality or suspicious osseous lesion. IMPRESSION: 1. New marked heterogeneity and enlargement of the liver consistent with diffuse metastases. 2. Fat containing left lower quadrant hernia at the site of a previous colostomy. 3. Small fat containing epigastric and left inguinal hernias. 4. Aortic Atherosclerosis (ICD10-I70.0). Electronically Signed   By: Logan Bores M.D.   On: 03/09/2019 17:37      Phillips Climes M.D on 03/11/2019 at 5:16 PM  Between 7am to 7pm - Pager - [phone]  After 7pm go to [email or web address] - password [facility]  [facility] -  Office  [phone]

## 2019-03-11 NOTE — Progress Notes (Signed)
Nutrition Follow-up  RD working remotely.  DOCUMENTATION CODES:   Severe malnutrition in context of acute illness/injury  INTERVENTION:   -Boost Breeze po TID, each supplement provides 250 kcal and 9 grams of protein -MVI with minerals daily -RD will follow for diet advancement and adjust supplement regimen as appropriate  NUTRITION DIAGNOSIS:   Severe Malnutrition related to acute illness(acute GI bleed, evidence of metastaic cancer to liver) as evidenced by severe fat depletion, moderate fat depletion, moderate muscle depletion, energy intake < or equal to 50% for > or equal to 5 days, percent weight loss(10% weight loss within 2 weeks).  Ongoing  GOAL:   Patient will meet greater than or equal to 90% of their needs  Progressing   MONITOR:   PO intake, Supplement acceptance, Diet advancement, Labs, Weight trends, Skin, I & O's  REASON FOR ASSESSMENT:   Consult Assessment of nutrition requirement/status  ASSESSMENT:   64 yo male admitted with GI bleeding and abdominal pain. PMH includes laryngeal cancer, tobacco & alcohol abuse, CAD, HTN, HLD, HF.  2/25- s/p upper GI endoscopy- revealed benign appearing esophageal stenosis; single bleeding angioectasia in stomach; advanced to clear liquid diet  Reviewed I/O's: +360 ml x 24 hours and +185 ml since admission  Per GI notes, no coffee ground emesis but pt complains of sore abdomen from coughing. There is a mild drop in Hgb and this will continue to be monitored and will transfuse if necessary.   Medications reviewed and include thiamine.   Labs reviewed: Na: 133. K WDL.   Diet Order:   Diet Order            Diet clear liquid Room service appropriate? Yes; Fluid consistency: Thin  Diet effective now              EDUCATION NEEDS:   Not appropriate for education at this time  Skin:  Skin Assessment: Reviewed RN Assessment  Last BM:  03/08/19  Height:   Ht Readings from Last 1 Encounters:  03/09/19 5\' 7"   (1.702 m)    Weight:   Wt Readings from Last 1 Encounters:  03/09/19 67.6 kg    Ideal Body Weight:  67.3 kg  BMI:  Body mass index is 23.34 kg/m.  Estimated Nutritional Needs:   Kcal:  2000-2200  Protein:  90-110 gm  Fluid:  1.8-2 L    Loistine Chance, RD, LDN, [place] Registered Dietitian II Certified Diabetes Care and Education Specialist Please refer to [facility] for RD and/or RD on-call/weekend/after hours pager

## 2019-03-11 NOTE — Plan of Care (Signed)
  Problem: Education: Goal: Knowledge of General Education information will improve Description: Including pain rating scale, medication(s)/side effects and non-pharmacologic comfort measures 03/11/2019 0627 by Loma Messing, RN Outcome: Progressing 03/11/2019 0020 by Loma Messing, RN Outcome: Progressing 03/11/2019 0019 by Loma Messing, RN Outcome: Progressing   Problem: Health Behavior/Discharge Planning: Goal: Ability to manage health-related needs will improve 03/11/2019 0627 by Loma Messing, RN Outcome: Progressing 03/11/2019 0020 by Karsten Fells D, RN Outcome: Progressing 03/11/2019 0019 by Karsten Fells D, RN Outcome: Progressing   Problem: Clinical Measurements: Goal: Ability to maintain clinical measurements within normal limits will improve 03/11/2019 0627 by Loma Messing, RN Outcome: Progressing 03/11/2019 0020 by Karsten Fells D, RN Outcome: Progressing 03/11/2019 0019 by Karsten Fells D, RN Outcome: Progressing Goal: Will remain free from infection 03/11/2019 0627 by Karsten Fells D, RN Outcome: Progressing 03/11/2019 0020 by Karsten Fells D, RN Outcome: Progressing 03/11/2019 0019 by Karsten Fells D, RN Outcome: Progressing Goal: Diagnostic test results will improve 03/11/2019 0627 by Loma Messing, RN Outcome: Progressing 03/11/2019 0020 by Karsten Fells D, RN Outcome: Progressing 03/11/2019 0019 by Karsten Fells D, RN Outcome: Progressing Goal: Respiratory complications will improve 03/11/2019 0627 by Loma Messing, RN Outcome: Progressing 03/11/2019 0020 by Loma Messing, RN Outcome: Progressing 03/11/2019 0019 by Karsten Fells D, RN Outcome: Progressing Goal: Cardiovascular complication will be avoided 03/11/2019 0627 by Loma Messing, RN Outcome: Progressing 03/11/2019 0020 by Loma Messing, RN Outcome: Progressing 03/11/2019 0019 by Loma Messing, RN Outcome: Progressing    Problem: Activity: Goal: Risk for activity intolerance will decrease 03/11/2019 0627 by Loma Messing, RN Outcome: Progressing 03/11/2019 0020 by Loma Messing, RN Outcome: Progressing 03/11/2019 0019 by Karsten Fells D, RN Outcome: Progressing   Problem: Nutrition: Goal: Adequate nutrition will be maintained 03/11/2019 0627 by Loma Messing, RN Outcome: Progressing 03/11/2019 0020 by Loma Messing, RN Outcome: Progressing 03/11/2019 0019 by Karsten Fells D, RN Outcome: Progressing   Problem: Coping: Goal: Level of anxiety will decrease 03/11/2019 0627 by Karsten Fells D, RN Outcome: Progressing 03/11/2019 0020 by Loma Messing, RN Outcome: Progressing 03/11/2019 0019 by Karsten Fells D, RN Outcome: Progressing   Problem: Elimination: Goal: Will not experience complications related to bowel motility 03/11/2019 0627 by Loma Messing, RN Outcome: Progressing 03/11/2019 0020 by Loma Messing, RN Outcome: Progressing 03/11/2019 0019 by Loma Messing, RN Outcome: Progressing Goal: Will not experience complications related to urinary retention 03/11/2019 0627 by Loma Messing, RN Outcome: Progressing 03/11/2019 0020 by Loma Messing, RN Outcome: Progressing 03/11/2019 0019 by Loma Messing, RN Outcome: Progressing   Problem: Pain Managment: Goal: General experience of comfort will improve 03/11/2019 0627 by Loma Messing, RN Outcome: Progressing 03/11/2019 0020 by Loma Messing, RN Outcome: Progressing 03/11/2019 0019 by Karsten Fells D, RN Outcome: Progressing   Problem: Safety: Goal: Ability to remain free from injury will improve 03/11/2019 0627 by Loma Messing, RN Outcome: Progressing 03/11/2019 0020 by Loma Messing, RN Outcome: Progressing 03/11/2019 0019 by Karsten Fells D, RN Outcome: Progressing   Problem: Skin Integrity: Goal: Risk for impaired skin integrity will  decrease 03/11/2019 0627 by Loma Messing, RN Outcome: Progressing 03/11/2019 0020 by Loma Messing, RN Outcome: Progressing 03/11/2019 0019 by Loma Messing, RN Outcome: Progressing

## 2019-03-11 NOTE — Care Management Important Message (Signed)
Important Message  Patient Details  Name: [name] MRN: [id number] Date of Birth: 05-26-1955   Medicare Important Message Given:  Yes     Orbie Pyo 03/11/2019, 2:37 PM

## 2019-03-11 NOTE — Progress Notes (Signed)
I will set up outpatient appt next week

## 2019-03-11 NOTE — Telephone Encounter (Signed)
Called pt per 2/25 sch message - unable to reach pt . Left message with appt date and time

## 2019-03-11 NOTE — Plan of Care (Signed)

## 2019-03-11 NOTE — Evaluation (Signed)
Physical Therapy Evaluation & Discharge Patient Details Name: [name] MRN: [id number] DOB: 03/27/1955 Today's Date: 03/11/2019   History of Present Illness  Pt is a 64 year old man admitted 03/09/19 with 3 days of black vomiting and stools. 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 PMH includes laryngeal cancer s/p radiation, CAD, CABG, anxiety, remote hx of tobacco use, alcohol use, colostomy (now reversed).    Clinical Impression  Patient evaluated by Physical Therapy with no further acute PT needs identified. PTA, pt independent, retired and lives with children. Today, pt moving well at supervision-level; son present for education to allow pt to ambulate in hallway with his supervision. VSS on RA. All education has been completed and the patient has no further questions. Encouraged walking at least 3x/day, if not more frequently, with assist from family or nursing staff as needed (RN aware). Acute PT is signing off. Thank you for this referral.    Follow Up Recommendations No PT follow up    Equipment Recommendations  None recommended by PT    Recommendations for Other Services       Precautions / Restrictions Precautions Precautions: Fall Restrictions Weight Bearing Restrictions: No      Mobility  Bed Mobility Overal bed mobility: Independent                Transfers Overall transfer level: Independent Equipment used: None Transfers: Sit to/from Stand              Ambulation/Gait Ambulation/Gait assistance: Scientist, forensic (Feet): 150 Feet Assistive device: IV Pole;None Gait Pattern/deviations: Step-through pattern;Decreased stride length   Gait velocity interpretation: 1.31 - 2.62 ft/sec, indicative of limited community ambulator General Gait Details: Slow, guarded gait mod indep with and without pushing IV pole; supervision due to fall risk. Son Moosa Klima.) present for education  Stairs            Wheelchair Mobility     Modified Rankin (Stroke Patients Only)       Balance Overall balance assessment: Needs assistance   Sitting balance-Leahy Scale: Good       Standing balance-Leahy Scale: Fair                               Pertinent Vitals/Pain Pain Assessment: Faces Faces Pain Scale: Hurts a little bit Pain Location: R side of abdomen Pain Descriptors / Indicators: Discomfort;Sore Pain Intervention(s): Monitored during session    Home Living Family/patient expects to be discharged to:: Private residence Living Arrangements: Children Available Help at Discharge: Family;Available 24 hours/day Type of Home: House Home Access: Stairs to enter   [employer] of Steps: 2 Home Layout: One level Home Equipment: Environmental consultant - 2 wheels      Prior Function Level of Independence: Independent         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[employer], [employer]        Extremity/Trunk Assessment   Upper Extremity Assessment Upper Extremity Assessment: Overall WFL for tasks assessed    Lower Extremity Assessment Lower Extremity Assessment: Overall WFL for tasks assessed    Cervical / Trunk Assessment Cervical / Trunk Assessment: Normal  Communication   Communication: No difficulties(baseline hoarseness)  Cognition Arousal/Alertness: Awake/alert Behavior During Therapy: WFL for tasks assessed/performed Overall Cognitive Status: Within Functional Limits for tasks assessed  General Comments General comments (skin integrity, edema, etc.): SpO2 94% on RA; with RN approval, switched to portable tele monitor to allow for ease with more frequent OOB mobility    Exercises     Assessment/Plan    PT Assessment Patent does not need any further PT services  PT Problem List         PT Treatment Interventions      PT Goals (Current goals can be found in the Care Plan section)  Acute Rehab PT  Goals Patient Stated Goal: Get some rest PT Goal Formulation: All assessment and education complete, DC therapy    Frequency     Barriers to discharge        Co-evaluation               AM-PAC PT "6 Clicks" Mobility  Outcome Measure Help needed turning from your back to your side while in a flat bed without using bedrails?: None Help needed moving from lying on your back to sitting on the side of a flat bed without using bedrails?: None Help needed moving to and from a bed to a chair (including a wheelchair)?: None Help needed standing up from a chair using your arms (e.g., wheelchair or bedside chair)?: None Help needed to walk in hospital room?: None Help needed climbing 3-5 steps with a railing? : A Little 6 Click Score: 23    End of Session   Activity Tolerance: Patient tolerated treatment well Patient left: with call bell/phone within reach;with family/visitor present(using bathroom) Nurse Communication: Mobility status PT Visit Diagnosis: Other abnormalities of gait and mobility (R26.89)    Time: [id number] PT Time Calculation (min) (ACUTE ONLY): 19 min   Charges:   PT Evaluation $PT Eval Low Complexity: [place], PT, DPT Acute Rehabilitation Services  Pager [phone] Office [place] 03/11/2019, 12:43 PM

## 2019-03-11 NOTE — Progress Notes (Signed)
Subjective: Sore abdomen from coughing.  No coffee-ground emesis.  Objective: Vital signs in last 24 hours: Temp:  [97.3 F (36.3 C)-98.2 F (36.8 C)] 98.2 F (36.8 C) (02/25 2135) Pulse Rate:  [96-99] 96 (02/25 2135) Resp:  [16-18] 17 (02/25 2135) BP: (111-128)/(68-88) 121/70 (02/25 2135) SpO2:  [85 %-100 %] 95 % (02/25 2135) Last BM Date: (PTA )  Intake/Output from previous day: 02/25 0701 - 02/26 0700 In: 360 [P.O.:360] Out: -  Intake/Output this shift: No intake/output data recorded.  General appearance: alert and no distress GI: soft, non-tender; bowel sounds normal; no masses,  no organomegaly  Lab Results: Recent Labs    03/10/19 0821 03/10/19 1839 03/11/19 0254  WBC 13.1* 12.4* 11.7*  HGB 10.1* 10.3* 9.7*  HCT 30.7* 30.1* 28.5*  PLT 294 288 259   BMET Recent Labs    03/09/19 1123 03/10/19 0527 03/11/19 0254  NA 131* 134* 133*  K 4.8 4.5 4.3  CL 91* 96* 97*  CO2 24 25 25   GLUCOSE 97 75 83  BUN 11 11 11   CREATININE 1.17 1.01 1.09  CALCIUM 8.6* 8.3* 8.3*   LFT Recent Labs    03/11/19 0254  PROT 5.1*  ALBUMIN 2.2*  AST 313*  ALT 151*  ALKPHOS 581*  BILITOT 2.0*   PT/INR Recent Labs    03/09/19 1123  LABPROT 15.2  INR 1.2   Hepatitis Panel Recent Labs    03/09/19 1259  HEPBSAG NON REACTIVE  HCVAB NON REACTIVE  HEPAIGM NON REACTIVE  HEPBIGM NON REACTIVE   C-Diff No results for input(s): CDIFFTOX in the last 72 hours. Fecal Lactopherrin No results for input(s): FECLLACTOFRN in the last 72 hours.  Studies/Results: CT SOFT TISSUE NECK W CONTRAST  Result Date: 03/10/2019 CLINICAL DATA:  History of left laryngeal cancer post treatment. EXAM: CT NECK WITH CONTRAST TECHNIQUE: Multidetector CT imaging of the neck was performed using the standard protocol following the bolus administration of intravenous contrast. CONTRAST:  74mL OMNIPAQUE IOHEXOL 300 MG/ML  SOLN COMPARISON:  CT neck 01/29/2015 FINDINGS: Pharynx and larynx: Diffuse edema  in the larynx bilaterally involving the vocal cords and aryepiglottic folds. This is relatively symmetric. No focal mass. This edema was present previously. Tongue and tonsils negative. Salivary glands: Atrophic submandibular glands bilaterally have progressed since 2017 compatible radiation change. Parotid gland negative bilaterally. Thyroid: No focal lesion. Lymph nodes: Negative for lymphadenopathy in the neck. Vascular: Diffuse atherosclerotic disease. Right carotid stent which appears patent. Left common carotid artery is occluded at the origin, chronic and unchanged. Stent in the proximal left subclavian artery which is patent however there is moderate stenosis above the stent. Both vertebral arteries patent with diffuse atherosclerotic calcification. Limited intracranial: Negative Visualized orbits: Not imaged Mastoids and visualized paranasal sinuses: Mild mucosal edema right maxillary sinus. Otherwise clear. Skeleton: Cervical spondylosis without acute skeletal abnormality. Median sternotomy. Upper chest: Chest findings reported separately Other: None IMPRESSION: Post treatment edema in the larynx bilaterally. No recurrent tumor or adenopathy in the neck. Severe atherosclerotic disease. Right carotid stent patent. Chronic occlusion left common carotid artery. Electronically Signed   By: Franchot Gallo M.D.   On: 03/10/2019 10:12   CT CHEST W CONTRAST  Result Date: 03/10/2019 CLINICAL DATA:  Head and neck cancer staging, left laryngeal cancer EXAM: CT CHEST WITH CONTRAST TECHNIQUE: Multidetector CT imaging of the chest was performed during intravenous contrast administration. CONTRAST:  63mL OMNIPAQUE IOHEXOL 300 MG/ML  SOLN COMPARISON:  PET-CT, 02/14/2015, CT abdomen pelvis, 03/09/2019 FINDINGS: Cardiovascular: Aortic  atherosclerosis. Stent of the left subclavian artery origin. The included portion of the left common carotid artery is occluded from the origin. Normal heart size. Extensive 3 vessel  coronary artery calcifications and/or stents. Status post median sternotomy and CABG. No pericardial effusion. Mediastinum/Nodes: New, bulky mediastinal lymph nodes, largest pretracheal node measuring 3.8 x 2.6 cm (series 3, image 60) thyroid gland, trachea, and esophagus demonstrate no significant findings. Lungs/Pleura: Moderate centrilobular emphysema. Diffuse bilateral bronchial wall thickening. Clustered centrilobular and tree-in-bud pulmonary nodules of the posterior right upper lobe (series 4, image 47). Small bilateral pleural effusions and associated atelectasis or consolidation. No pleural effusion or pneumothorax. Upper Abdomen: No acute abnormality. Very bulky, heterogeneous, and nodular appearance of the liver with trace ascites. Musculoskeletal: No chest wall mass or suspicious bone lesions identified. IMPRESSION: 1. New, bulky mediastinal lymph nodes, largest pretracheal node measuring 3.8 x 2.6 cm, consistent with metastatic disease. 2. Very bulky, heterogeneous, and nodular appearance of the liver with trace ascites, better evaluated by prior CT of the abdomen and pelvis and consistent with metastatic disease. 3. Small bilateral pleural effusions and associated atelectasis or consolidation. 4. Clustered centrilobular and tree-in-bud pulmonary nodules of the posterior right upper lobe, consistent with atypical infection, particularly including atypical mycobacteria. 5.  Emphysema (ICD10-J43.9). 6. Coronary artery disease.  Aortic Atherosclerosis (ICD10-I70.0). Electronically Signed   By: Eddie Candle M.D.   On: 03/10/2019 10:24   CT ABDOMEN PELVIS W CONTRAST  Result Date: 03/09/2019 CLINICAL DATA:  Abdominal pain. EXAM: CT ABDOMEN AND PELVIS WITH CONTRAST TECHNIQUE: Multidetector CT imaging of the abdomen and pelvis was performed using the standard protocol following bolus administration of intravenous contrast. CONTRAST:  128mL OMNIPAQUE IOHEXOL 300 MG/ML  SOLN COMPARISON:  08/22/2014 FINDINGS:  Lower chest: Mild atelectasis in the lung bases. No pleural effusion. Three-vessel coronary atherosclerosis. Hepatobiliary: New marked heterogeneous enhancement throughout the liver which is now diffusely enlarged and nodular. Unremarkable gallbladder. No biliary dilatation. Pancreas: Unremarkable. Spleen: Unremarkable. Adrenals/Urinary Tract: Unremarkable adrenal glands. Excreted contrast in the renal collecting systems limiting assessment for calculi. No hydronephrosis. Subcentimeter hypodensity in the upper pole of the left kidney, unchanged and too small to fully characterize. Excreted contrast in the ureters and bladder. Moderate bladder distension. Stomach/Bowel: The stomach is unremarkable. Sequelae of previous sigmoid colectomy are again identified with interval colostomy takedown and reanastomosis. There is no evidence of bowel obstruction. There is submucosal fat deposition in the ascending colon and hepatic flexure likely reflecting the sequelae of prior inflammation. The appendix is unremarkable. Vascular/Lymphatic: Diffuse abdominal aortic atherosclerosis without aneurysm. No enlarged lymph nodes. Reproductive: Unremarkable prostate. Other: Fat containing left lower quadrant ventral hernia at the site of the prior colostomy. Small fat containing epigastric and left inguinal hernias. Trace perihepatic free fluid. Musculoskeletal: No acute osseous abnormality or suspicious osseous lesion. IMPRESSION: 1. New marked heterogeneity and enlargement of the liver consistent with diffuse metastases. 2. Fat containing left lower quadrant hernia at the site of a previous colostomy. 3. Small fat containing epigastric and left inguinal hernias. 4. Aortic Atherosclerosis (ICD10-I70.0). Electronically Signed   By: Logan Bores M.D.   On: 03/09/2019 17:37    Medications:  Scheduled: . ALPRAZolam  0.5 mg Oral BID  . atorvastatin  80 mg Oral Daily  . buPROPion  100 mg Oral TID  . Chlorhexidine Gluconate Cloth  6  each Topical Daily  . multivitamin with minerals  1 tablet Oral Daily  . [START ON 03/13/2019] pantoprazole  40 mg Intravenous Q12H  . thiamine injection  100 mg Intravenous Daily   Continuous: . pantoprozole (PROTONIX) infusion 8 mg/hr (03/10/19 2223)    Assessment/Plan: 1) Bleeding gastric AVM s/p ablation. 2) Anemia.   There is a mild drop in his HGB, which is expected as the site was bleeding during the EGD.  Clinically he is well.  Plan: 1) Monitor HGB and transfuse as necessary. 2) If stable by tomorrow he can be discharged home from the GI standpoint. 3) Patient can follow up with his PCP for his HGB.  LOS: 2 days   Royal Beirne D 03/11/2019, 6:25 AM

## 2019-03-12 LAB — BASIC METABOLIC PANEL
Anion gap: 10 (ref 5–15)
BUN: 8 mg/dL (ref 8–23)
CO2: 26 mmol/L (ref 22–32)
Calcium: 8.1 mg/dL — ABNORMAL LOW (ref 8.9–10.3)
Chloride: 97 mmol/L — ABNORMAL LOW (ref 98–111)
Creatinine, Ser: 0.92 mg/dL (ref 0.61–1.24)
GFR calc Af Amer: >60 mL/min (ref >60)
GFR calc non Af Amer: >60 mL/min (ref >60)
Glucose, Bld: 91 mg/dL (ref 70–99)
Potassium: 3.9 mmol/L (ref 3.5–5.1)
Sodium: 133 mmol/L — ABNORMAL LOW (ref 135–145)

## 2019-03-12 LAB — CBC
HCT: 29.2 % — ABNORMAL LOW (ref 39.0–52.0)
Hemoglobin: 9.8 g/dL — ABNORMAL LOW (ref 13.0–17.0)
MCH: 30.3 pg (ref 26.0–34.0)
MCHC: 33.6 g/dL (ref 30.0–36.0)
MCV: 90.4 fL (ref 80.0–100.0)
Platelets: 269 10*3/uL (ref 150–400)
RBC: 3.23 MIL/uL — ABNORMAL LOW (ref 4.22–5.81)
RDW: 17.1 % — ABNORMAL HIGH (ref 11.5–15.5)
WBC: 11.4 10*3/uL — ABNORMAL HIGH (ref 4.0–10.5)
nRBC: 0 % (ref 0.0–0.2)

## 2019-03-12 MED ORDER — ASPIRIN 81 MG PO TBEC: 81.0000 mg | DELAYED_RELEASE_TABLET | Freq: Every day | ORAL | 12 refills | Status: AC

## 2019-03-12 MED ORDER — PANTOPRAZOLE SODIUM 40 MG PO TBEC: 40.0000 mg | DELAYED_RELEASE_TABLET | Freq: Every day | ORAL | 0 refills | Status: AC

## 2019-03-12 NOTE — Discharge Instructions (Signed)
Gastrointestinal Bleeding Gastrointestinal (GI) bleeding is bleeding somewhere along the path that food travels through the body (digestive tract). This path is anywhere between the mouth and the opening of the butt (anus). You may have blood in your poop (stool) or have black poop. If you throw up (vomit), there may be blood in it. This condition can be mild, serious, or even life-threatening. If you have a lot of bleeding, you may need to stay in the hospital. What are the causes? This condition may be caused by:  Irritation and swelling of the esophagus (esophagitis). The esophagus is part of the body that moves food from your mouth to your stomach.  Swollen veins in the butt (hemorrhoids).  Areas of painful tearing in the opening of the butt (anal fissures). These are often caused by passing hard poop.  Pouches that form on the colon over time (diverticulosis).  Irritation and swelling (diverticulitis) in areas where pouches have formed on the colon.  Growths (polyps) or cancer. Colon cancer often starts out as growths that are not cancer.  Irritation of the stomach lining (gastritis).  Sores (ulcers) in the stomach. What increases the risk? You are more likely to develop this condition if you:  Have a certain type of infection in your stomach (Helicobacter pylori infection).  Take certain medicines.  Smoke.  Drink alcohol. What are the signs or symptoms? Common symptoms of this condition include:  Throwing up (vomiting) material that has bright red blood in it. It may look like coffee grounds.  Changes in your poop. The poop may: ? Have red blood in it. ? Be black, look like tar, and smell stronger than normal. ? Be red.  Pain or cramping in the belly (abdomen). How is this treated? Treatment for this condition depends on the cause of the bleeding. For example:  Sometimes, the bleeding can be stopped during a procedure that is done to find the problem (endoscopy  or colonoscopy).  Medicines can be used to: ? Help control irritation, swelling, or infection. ? Reduce acid in your stomach.  Certain problems can be treated with: ? Creams. ? Medicines that are put in the butt (suppositories). ? Warm baths.  Surgery is sometimes needed.  If you lose a lot of blood, you may need a blood transfusion. If bleeding is mild, you may be allowed to go home. If there is a lot of bleeding, you will need to stay in the hospital. Follow these instructions at home:   Take over-the-counter and prescription medicines only as told by your doctor.  Eat foods that have a lot of fiber in them. These foods include beans, whole grains, and fresh fruits and vegetables. You can also try eating 1-3 prunes each day.  Drink enough fluid to keep your pee (urine) pale yellow.  Keep all follow-up visits as told by your doctor. This is important. Contact a doctor if:  Your symptoms do not get better. Get help right away if:  Your bleeding does not stop.  You feel dizzy or you pass out (faint).  You feel weak.  You have very bad cramps in your back or belly.  You pass large clumps of blood (clots) in your poop.  Your symptoms are getting worse.  You have chest pain or fast heartbeats. Summary  GI bleeding is bleeding somewhere along the path that food travels through the body (digestive tract).  This bleeding can be caused by many things. Treatment depends on the cause of the bleeding.  Take medicines only as told by your doctor.  Keep all follow-up visits as told by your doctor. This is important. This information is not intended to replace advice given to you by your health care provider. Make sure you discuss any questions you have with your health care provider. Document Revised: 08/12/2017 Document Reviewed: 08/12/2017 Elsevier Patient Education  2020 [place]. Follow with Primary MD [name], NP in 7 days   Get CBC, CMP, checked  by  Primary MD next visit.     Liver Cancer  Liver cancer is an abnormal growth of cells (tumor) in the liver. The liver is located on the upper right side of the abdomen, just below the ribs. What are the causes? The exact cause of this condition is not known. What increases the risk? You are more likely to develop this condition if you:  Are a male.  Have scarring of the liver (cirrhosis). Cirrhosis may be caused by: ? Too much alcohol use. ? Hepatitis B or hepatitis C. ? Smoking.  Have diabetes.  Have a condition in which the body stores too much iron (hemochromatosis).  Have a buildup of fat in the liver without alcohol use (nonalcoholic fatty liver disease).  Are exposed to aflatoxins. These are substances made by certain types of mold that grow on food products, such as corn and peanuts.  Use drugs that build muscles (anabolic steroids) or medicines that prevent pregnancy (oral contraceptives).  Have a disease caused by parasitic flatworms (schistosomiasis).  Are obese. What are the signs or symptoms? In some cases, there are few or no symptoms in the beginning. As the disease progresses, symptoms may include:  Weight loss.  Loss of appetite.  Nausea or vomiting.  Feeling itchy.  Abnormal bruising or bleeding.  Feeling very weak and tired.  Pain on the right side of your abdomen, shoulder, or back.  Skin or eyes that look yellow in color (jaundice).  Dark-colored urine.  White, chalk-like stools. Liver cancer may cause other medical conditions to develop, such as:  Hypercalcemia. This is high calcium levels in the blood, which may cause weakness, constipation, or confusion.  Hypoglycemia. This is low blood sugar, which may cause fatigue, confusion, and sweating.  Enlarged breasts in men (gynecomastia).  Small testicles in men.  Looking and feeling flushed due to a high amount of red blood cells in the body.  High levels of fat in the blood  (cholesterol). How is this diagnosed? This condition is diagnosed with a medical history and physical exam. You may also have tests, including:  Blood tests.  Imaging tests, such as: ? CT scan. ? MRI. ? Ultrasound. ? Bone scan.  Laparoscopy. A small, lighted camera (laparoscope) is used to look at your liver and other organs.  Biopsy. Samples of tissue from the liver are taken and tested in a lab. If liver cancer is confirmed, it will be staged to determine its severity and extent. Staging checks:  The size of the tumor.  If the cancer has spread.  Where the cancer has spread. How is this treated? Treatment for this condition depends on the type and stage of the cancer, and your overall health. Treatment may include:  Surgery to remove the cancer cells. Sometimes the entire liver is removed and replaced with a healthy liver (liver transplant).  Chemotherapy. Medicines are used to kill the cancer cells.  Targeted therapy. This targets specific parts of cancer cells and nearby areas to block the growth and spread of  the cancer.  Immunotherapy, also called biological therapy. This strengthens your body's defense (immune) system to destroy or stop the growth and spread of cancer cells.  Radiation therapy. This uses high-energy X-rays to kill the cancer cells.  Ablation. This destroys the tumor cells using high-energy radio waves, cold therapy, heat therapy, or a type of alcohol (ethanol).  Embolization. This is a procedure that blocks the blood flow to the tumor in the liver.  Chemoembolization. This combines embolization with tiny beads that deliver chemotherapy directly to the site of the tumor.  Radioembolization. This combines embolization with tiny beads that deliver radiation directly to the site of the tumor. Follow these instructions at home: Medicines  Take over-the-counter and prescription medicines only as told by your health care provider.  Ask your health care  provider about: ? Changing or stopping your regular medicines. This is especially important if you are taking diabetes medicines or blood thinners. ? Taking medicines such as aspirin and ibuprofen. These medicines can thin your blood. Do not take these medicines unless your health care provider tells you to take them. ? Taking over-the-counter medicines, vitamins, herbs, and supplements. Lifestyle   Do not drink alcohol.  Do not use any products that contain nicotine or tobacco, such as cigarettes and e-cigarettes. If you need help quitting, ask your health care provider.  Consider joining a cancer support group. Ask your health care provider for more information about local and online support groups. This may help you learn to cope with the stress of having liver cancer. General instructions  Talk to your health care provider about the side-effects of treatment and the best way to manage them.  Keep all follow-up visits as told by your health care provider. This is important. Where to find more information  American Cancer Society: www.cancer.[place]: www.cancer.gov Contact a health care provider if:  You cannot eat because you feel nauseous or are vomiting.  You feel weaker or more tired than usual.  Your pain gets worse.  You feel depressed or anxious. Get help right away if:  You feel confused.  Pain in your abdomen increases suddenly.  Your abdomen or legs start to swell.  You have a fever, chills, or body aches.  You notice unusual bleeding or bleeding that does not stop quickly.  You have maroon, black, or bloody stools.  Your eyes or skin become more yellow in color. These symptoms may represent a serious problem that is an emergency. Do not wait to see if the symptoms will go away. Get medical help right away. Call your local emergency services (911 in the [place] Do not drive yourself to the hospital. Summary  Liver cancer is an  abnormal growth of cells (tumor) in the liver that is cancerous (malignant).  Treatment for liver cancer depends on the type and stage of the cancer, and your overall health.  Talk to your health care provider about the side-effects of treatment and the best way to manage them.  Keep all follow-up visits as told by your health care provider. This is important. This information is not intended to replace advice given to you by your health care provider. Make sure you discuss any questions you have with your health care provider. Document Revised: 09/29/2017 Document Reviewed: 09/29/2017 Elsevier Patient Education  [place].   Metastatic Cancer  Metastatic cancer is cancer that has spread from the place where it started (primary site) to another part of the body. The process  of cancer spreading from the primary site is called metastasis. When cancer cells metastasize, they do not change the way they look or the way they affect the body. An example is when primary lung cancer spreads to the brain. This is called metastatic lung cancer, not brain cancer. All types of cancer can spread. Some cancers are more likely to metastasize than others. The most common places that cancers metastasize to are:  Bones.  Liver.  Lungs. What are the causes? Metastasis occurs when cancer cells spread from the primary site to another part of the body. Cancer cells can spread:  Directly from one part of the body to a nearby area (local invasion).  Into a lymph vessel. Cancer cells can be carried through the lymph system to lymph nodes and other parts of the body. The lymph system is a network of vessels and nodes that help protect against infections.  Into the blood vessels. Cancer cells can be carried to other parts of the body through the bloodstream. What increases the risk? The following factors may make you more likely to develop this condition:  The type of cancer that you have.  The stage  and grade of your primary cancer at the time of diagnosis.  The grade and stage of the tumor. Grading and staging predict how quickly cancer cells will grow and their chances of metastasis.  A large primary tumor.  A higher grade of tumor.  Deeper growth of tumor.  A tumor that has entered the lymph system. What are the signs or symptoms? Symptoms of this condition include:  Weakness.  Lack of energy.  Pain.  Weight loss.  Trouble breathing.  Fluid buildup in your lungs or abdomen.  Tumor growths that can be felt or seen.  An enlarged liver. Some people with this condition may have no symptoms. How is this diagnosed? This condition may be diagnosed based on:  Your symptoms.  Physical exam. This may include: ? Blood tests to check for certain substances that are secreted by tumors (tumor markers).  Tumor markers that increase after treatment can indicate metastasis.  Tumor markers may be used to help diagnose metastasis in some cancers, such as colon and prostate cancer.  Not all cancers have tumor markers. ? Imaging studies, such as:  X-rays.  Ultrasound.  MRI.  Other imaging tests, such as CT scans, bone scans, and PET scans. ? Testing tissue that is removed from the new cancer site (biopsy). If the cells are similar to cancer cells from the primary site, this can confirm metastatic cancer. ? Testing fluid samples from the lungs, spine, or abdomen for metastatic cancer cells. How is this treated? There are many options for treating metastatic cancer. Your treatment will depend on:  The type of cancer you have.  How far your cancer has advanced.  Your general health. Treatment may not be able to cure metastatic cancer, but it can often relieve the symptoms. In many cases, you may have a combination of treatments. Options may include:  Surgery.  Medicines that kill cancer cells (chemotherapy).  High-energy rays that kill cancer cells (radiation  therapy).  Targeted therapy. This targets specific parts of cancer cells and the area around them to block the growth and the spread of the cancer. Targeted therapy can help to limit the damage to healthy cells.  Hormone therapy.  Treatments that help your body fight cancer (biologic therapy).  Medicines that help your body's disease-fighting system (immune system) fight cancer cells (immunotherapy).  Freezing cancer cells using gas or liquid that is delivered through a needle (cryoablation).  Destroying cancer cells using high-energy radio waves that are delivered through a needle-like probe (radiofrequency ablation).  A procedure to block the artery that supplies blood to the tumor, which kills the cancer cells (embolization).  Other medicines to manage symptoms related to cancer or cancer treatments. Follow these instructions at home: Eating and drinking  Some of your treatments might affect your appetite and your ability to chew and swallow. If you are having problems eating, or if you do not have an appetite, meet with a diet and nutrition specialist (dietitian).  If you have side effects that affect eating, it may help to: ? Eat smaller meals and snacks often. ? Drink high-nutrition and high-calorie shakes or supplements. ? Eat bland and soft foods that are easy to eat. ? Avoid foods that are hot, spicy, or hard to swallow. Lifestyle   Do not drink alcohol.  Do not use any products that contain nicotine or tobacco, such as cigarettes and e-cigarettes. If you need help quitting, ask your health care provider. General instructions  Take over-the-counter and prescription medicines only as told by your health care provider. This includes vitamins, supplements, and herbal products.  Work with your health care provider to manage any side effects of treatment.  Keep all follow-up visits as told by your health care provider. This is important. Where to find more  information  American Cancer Society: www.cancer.[place]): www.cancer.gov Contact a health care provider if you:  Notice that you bruise or bleed easily.  Are losing weight without trying.  Have new or increased fatigue or weakness. Get help right away if you have:  A seizure.  A sudden increase in pain.  A fever.  Shortness of breath.  Chest pain. Summary  Metastatic cancer is cancer that has spread from the place where it started (primary site) to another part of the body.  Cancer cells can spread directly from one part of the body to a nearby area, or they may spread through the lymph system or the bloodstream.  Your risk for metastatic cancer depends on the type of cancer you have and the stage and grade of your primary cancer.  Treatment may not be able to cure metastatic cancer, but it can often relieve the symptoms. This information is not intended to replace advice given to you by your health care provider. Make sure you discuss any questions you have with your health care provider. Document Revised: 02/22/2018 Document Reviewed: 01/14/2017 Elsevier Patient Education  [place].  Activity: As tolerated with Full fall precautions use walker/cane & assistance as needed   Disposition Home    Diet: Soft diet   On your next visit with your primary care physician please Get Medicines reviewed and adjusted.   Please request your Prim.MD to go over all Hospital Tests and Procedure/Radiological results at the follow up, please get all Hospital records sent to your Prim MD by signing hospital release before you go home.   If you experience worsening of your admission symptoms, develop shortness of breath, life threatening emergency, suicidal or homicidal thoughts you must seek medical attention immediately by calling 911 or calling your MD immediately  if symptoms less severe.  You Must read complete instructions/literature along  with all the possible adverse reactions/side effects for all the Medicines you take and that have been prescribed to you. Take any new Medicines after you have  completely understood and accpet all the possible adverse reactions/side effects.   Do not drive, operating heavy machinery, perform activities at heights, swimming or participation in water activities or provide baby sitting services if your were admitted for syncope or siezures until you have seen by Primary MD or a Neurologist and advised to do so again.  Do not drive when taking Pain medications.    Do not take more than prescribed Pain, Sleep and Anxiety Medications  Special Instructions: If you have smoked or chewed Tobacco  in the last 2 yrs please stop smoking, stop any regular Alcohol  and or any Recreational drug use.  Wear Seat belts while driving.   Please note  You were cared for by a hospitalist during your hospital stay. If you have any questions about your discharge medications or the care you received while you were in the hospital after you are discharged, you can call the unit and asked to speak with the hospitalist on call if the hospitalist that took care of you is not available. Once you are discharged, your primary care physician will handle any further medical issues. Please note that NO REFILLS for any discharge medications will be authorized once you are discharged, as it is imperative that you return to your primary care physician (or establish a relationship with a primary care physician if you do not have one) for your aftercare needs so that they can reassess your need for medications and monitor your lab values.

## 2019-03-12 NOTE — Discharge Summary (Addendum)
[name], is a 64 y.o. male  DOB [birth year]  MRN [id number].  Admission date:  03/09/2019  Admitting Physician  Toy Baker, MD  Discharge Date:  03/12/2019   Primary MD  [name], NP  Recommendations for primary care physician for things to follow:  -Please check CBC, BMP, LFTs during next visit. -Aspirin is held on discharge, resume on 3/2, 5 days after procedure -Patient to follow with oncology regarding metastatic liver disease, Dr. Alvy Bimler will arrange for appointment this coming week   Admission Diagnosis  Liver metastases ([place]) [C78.7] Upper GI bleed [K92.2] Transaminitis [R74.01] Gastrointestinal hemorrhage, unspecified gastrointestinal hemorrhage type [K92.2]   Discharge Diagnosis  Liver metastases ([place]) [C78.7] Upper GI bleed [K92.2] Transaminitis [R74.01] Gastrointestinal hemorrhage, unspecified gastrointestinal hemorrhage type [K92.2]    Active Problems:   Dyslipidemia   Chronic combined systolic and diastolic heart failure (HCC)   Squamous cell carcinoma of larynx (HCC)   Cancer associated pain   Protein calorie malnutrition ([place])   Occlusion of left carotid artery   Coronary artery disease involving coronary bypass graft of native heart without angina pectoris   Anxiety   HLD (hyperlipidemia)   Hypertension   Tobacco abuse   Upper GI bleed   Elevated LFTs   Liver metastases ([place])      Past Medical History:  Diagnosis Date  . Acute encephalopathy, improved 06/20/2013  . Acute MI, troponin > 20, no obvious culprit vessel 06/20/2013  . Anxiety   . Arthritis   . At risk for sudden cardiac death, has lifevest at discharge 06/20/2013  . Carotid stenosis   . Coronary artery disease    s/p CABG 2006; stenting to vein graft 05-2013; NSTEMI 06-2013 in setting of PEA arrest  . Depression   . Difficulty swallowing solids    Pt unable to swallow solid foods  .  Dizziness 06/20/2013  . Dyslipidemia 10/09/2013   [name] Study atorvastatin -eIRB # [id number] tablet Take 40 mg by mouth daily.   . Fever, possible aspiration-treated with antibiotics 06/20/2013  . GERD (gastroesophageal reflux disease)   . Heart failure, acute on chronic, systolic and diastolic ([place]) [id number]  . Heart murmur   . History of radiation therapy 09/05/14-10/23/14   larynx 70 Gy  . Hyperlipidemia   . Hypertension   . Hypothermia, induced, post arrest 06/20/2013  . Incisional hernia   . Ischemic cardiomyopathy    echo 06/2013 EF 10-15% (normal 01/2013)  . PAD (peripheral artery disease) ([place]) 10/09/2013   Occluded left common carotid and moderate right internal carotid artery stenosis. Previous stent to the left subclavian artery. Right iliac stent. Bilateral 60-99% renal artery stenosis    . Radiation-induced esophageal stricture   . Shock liver 06/20/2013  . Shortness of breath dyspnea    with exertion  . Squamous cell carcinoma of larynx ([place]) 07/14/2014  . Subclavian artery stenosis, left (HCC)    s/p stenting 2006  . Tobacco abuse    >100 pack year history   . Weakness due to cardiac arrest  06/20/2013  . Wears glasses     Past Surgical History:  Procedure Laterality Date  . CARDIAC CATHETERIZATION    . CAROTID ANGIOGRAPHY  07/10/2016   Procedure: Carotid Angiography;  Surgeon: Lorretta Harp, MD;  Location: [facility];  Service: Cardiovascular;;  Rt. Carotid  . CAROTID PTA/STENT INTERVENTION N/A 07/10/2016   Procedure: Carotid PTA/Stent Intervention;  Surgeon: Lorretta Harp, MD;  Location: [facility];  Service: Cardiovascular;  Laterality: N/A;  Rt. External Carotid  . COLON SURGERY    . COLONOSCOPY    . COLOSTOMY    . COLOSTOMY TAKEDOWN  06/13/2015  . COLOSTOMY TAKEDOWN N/A 06/13/2015   Procedure: COLOSTOMY TAKEDOWN;  Surgeon: Coralie Keens, MD;  Location: [facility];  Service: General;  Laterality: N/A;  . CORONARY ARTERY BYPASS GRAFT  09/2004   SVG to LAD (at  [facility])  . DIRECT LARYNGOSCOPY N/A 07/06/2014   Procedure: DIRECT LARYNGOSCOPY AND ESOPAGOSCOPY WITH BIOPSY;  Surgeon: [name], MD;  Location: [facility];  Service: ENT;  Laterality: N/A;  . ESOPHAGOSCOPY WITH DILITATION N/A 10/31/2015   Procedure: ESOPHAGOSCOPY WITH DILITATION;  Surgeon: [name], MD;  Location: [facility];  Service: ENT;  Laterality: N/A;  . ILIAC ARTERY STENT  07/24/2008   8x6 Smart nitinol self-expanding stent to origin of ilaci down into external iliac crossing the hypogastric artery (Dr. Adora Fridge)  . [place] REPAIR  06/13/2015  . LAPAROTOMY N/A 08/05/2014   Procedure: EXPLORATORY LAPAROTOMY WITH SIGMOIDOTOMY WITH COLECTOMY & COLOSTOMY;  Surgeon: Coralie Keens, MD;  Location: [facility];  Service: General;  Laterality: N/A;  . LEFT HEART CATHETERIZATION WITH CORONARY/GRAFT ANGIOGRAM N/A 06/15/2013   Procedure: LEFT HEART CATHETERIZATION WITH Beatrix Fetters;  Surgeon: Sinclair Grooms, MD;  Location: [facility];  Service: Cardiovascular;  Laterality: N/A;  . LOWER EXTREMITY ARTERIAL DOPPLER  08/2008   right and left ABI - mild arterial insufficiency at rest; R CIA/stent with 50-69% narrowing, L CIA with increased velocities (50-69% diameter reduction)  . NM MYOCAR PERF WALL MOTION  06/2008   persantine myoview - normal pattern of systolic thickening/wall motion/perfusion;  . PEG TUBE REMOVAL    . PERIPHERAL VASCULAR CATHETERIZATION  05/2013   stent to SVG - LAD vein graft at [facility]  . PERIPHERAL VASCULAR CATHETERIZATION N/A 05/08/2015   Procedure: Aortic Arch Angiography;  Surgeon: Serafina Mitchell, MD;  Location: [facility];  Service: Cardiovascular;  Laterality: N/A;  . PERIPHERAL VASCULAR CATHETERIZATION N/A 05/08/2015   Procedure: Carotid Angiography;  Surgeon: Serafina Mitchell, MD;  Location: [facility];  Service: Cardiovascular;  Laterality: N/A;  . PERIPHERAL VASCULAR CATHETERIZATION  06/13/2015   Procedure: PORTA CATH REMOVAL;  Surgeon: Coralie Keens, MD;  Location: [facility];  Service: General;;  . PORT-A-CATH REMOVAL  06/13/2015  . RENAL ARTERY DOPPLER  05/2008   right and left prox renal arteries with narrowing and increased velocities (60-99% diameter reduction)  . SUBCLAVIAN ANGIOGRAM Left 08/20/2004   8x87mm Genesis balloon mounted stent       History of present illness and  Hospital Course:     Kindly see H&P for history of present illness and admission details, please review complete Labs, Consult reports and Test reports for all details in brief  HPI  from the history and physical done on the day of admission 03/09/2019  [name] is a 64 y.o. male with medical history significant of laryngeal cancer tobacco and alcohol abuse, anxiety, carotid artery  stenosis, remote hx of CAD, cardiac arrest 2015    Presented with  3 day of black vomit and black stools. RUQ pain he reports that he initially was evaluated at urgent care and was told he is probably constipated was told to take some laxative and drink water and see if that helps it did not seem to help his abdominal pain but there after he started to developed nausea vomiting and black stools nausea productive of jet black-colored emesis.  decreased PO inteke loss of weight has been fatigued  Pt is on aspirin and Plavix for hx of carotid artery disease followed by Dr. Scot Dock  Her records it seems that he can be taken off of Plavix but patient chose to stay on  Last time he had stents was few year ago  Patient states that he stopped smoking for the past few years And says stop drinking he only been drinking 3-4 beers a day never drink any liquor and has decreased that and stoped. He has in general decreased amount of p.o. intake due to lack of appetite. Also reports has sometimes trouble swallowing but mainly if he chews his food good he does okay   iInfectious risk factors:  Reports N/V/ abdominal pain,   fatigue     In  ER  [place] Hospital Course   64 y.o.malewith medical history significant of laryngeal cancer , post radiation, history of tobacco abuse, history of alcohol abuse (drinking 3 weeks ago ) anxiety, carotid artery stenosis, remote hx of CAD, cardiac arrest 2015. -Presents to ED secondary to complaints of black stools, vomiting, with 1 episodes of dark-colored vomiting, keep in ED significant for liver metastasis, admitted for further work-up.  Upper GI bleed -Patient presents with coffee-ground emesis, GI input greatly appreciated, status post endoscopy 2/25, significant for bleeding gastric AVM, which did require ablation.  Management per GI, patient was kept on Protonix drip during hospital stay, he will be discharged on Protonix, hemoglobin initially with some drop, but remained stable over last 48 hours. -Patient may resume aspirin 3/2 to 5 days after his procedure as discussed with Dr. Bryan Lemma   Metastatic  liver disease/history of squamous cell of the larynx -Following with oncology as an outpatient Dr. Alvy Bimler regarding his larynx tumor, metastatic liver disease is a new findings, to follow with oncology as an outpatient regarding further work-up.  Likely he will need PET scan as an outpatient.  Transaminitis -Most likely related to liver metastasis  -Discontinue statin on discharge  Cancer associated pain -pain management as needed  Severe protein calorie malnutrition(HCC)  -continue with supplements  Occlusionof left carotid artery  -hold Plavix due to above  Coronary artery diseaseinvolving coronary bypass graft of native heart without angina pectoris - -Plavix  and aspirin has been held during hospital stay, continue with Coreg , I did stop his statin .resume   Aspirin on 3/2  Anxiety -resume home medications  History of alcohol  abuse states that he quit drinking 3 weeks ago.  There was no evidence of withdrawals during hospital stay  Hypertension - blood  pressure is controlled, continue with home Coreg   Discharge Condition:  Stable   Follow UP  Follow-up Information    [name], MD Follow up.   Specialty: Hematology and Oncology Contact information: [place] [id number] [phone]             Discharge Instructions  and  Discharge  Medications    Discharge Instructions    Diet - low sodium heart healthy   Complete by: As directed    Discharge instructions   Complete by: As directed    Follow with Primary MD [name], NP in 7 days   Get CBC, CMP, checked  by Primary MD next visit.    Activity: As tolerated with Full fall precautions use walker/cane & assistance as needed   Disposition Home    Diet: Soft diet   On your next visit with your primary care physician please Get Medicines reviewed and adjusted.   Please request your Prim.MD to go over all Hospital Tests and Procedure/Radiological results at the follow up, please get all Hospital records sent to your Prim MD by signing hospital release before you go home.   If you experience worsening of your admission symptoms, develop shortness of breath, life threatening emergency, suicidal or homicidal thoughts you must seek medical attention immediately by calling 911 or calling your MD immediately  if symptoms less severe.  You Must read complete instructions/literature along with all the possible adverse reactions/side effects for all the Medicines you take and that have been prescribed to you. Take any new Medicines after you have completely understood and accpet all the possible adverse reactions/side effects.   Do not drive, operating heavy machinery, perform activities at heights, swimming or participation in water activities or provide baby sitting services if your were admitted for syncope or siezures until you have seen by Primary MD or a Neurologist and advised to do so again.  Do not drive when taking Pain  medications.    Do not take more than prescribed Pain, Sleep and Anxiety Medications  Special Instructions: If you have smoked or chewed Tobacco  in the last 2 yrs please stop smoking, stop any regular Alcohol  and or any Recreational drug use.  Wear Seat belts while driving.   Please note  You were cared for by a hospitalist during your hospital stay. If you have any questions about your discharge medications or the care you received while you were in the hospital after you are discharged, you can call the unit and asked to speak with the hospitalist on call if the hospitalist that took care of you is not available. Once you are discharged, your primary care physician will handle any further medical issues. Please note that NO REFILLS for any discharge medications will be authorized once you are discharged, as it is imperative that you return to your primary care physician (or establish a relationship with a primary care physician if you do not have one) for your aftercare needs so that they can reassess your need for medications and monitor your lab values.   Increase activity slowly   Complete by: As directed      Allergies as of 03/12/2019      Reactions   Cetuximab Anaphylaxis   Cardiac arrest 08/02/2014      Medication List    STOP taking these medications   atorvastatin 80 MG tablet Commonly known as: LIPITOR   clopidogrel 75 MG tablet Commonly known as: PLAVIX     TAKE these medications   ALPRAZolam 0.5 MG tablet Commonly known as: XANAX Take 0.5 mg by mouth 2 (two) times daily.   aspirin 81 MG EC tablet Take 1 tablet (81 mg total) by mouth daily. Resume on 03/15/2019 Start taking on: March 15, 2019 What changed:   additional instructions  These instructions start on March 15, 2019. If you are unsure what to do until then, ask your doctor or other care provider.   buPROPion 100 MG tablet Commonly known as: WELLBUTRIN Take 100 mg by mouth 3 (three) times daily.     carvedilol 6.25 MG tablet Commonly known as: COREG TAKE 1 TABLET(6.25 MG) BY MOUTH TWICE DAILY WITH A MEAL What changed: See the new instructions.   Generlac 10 GM/15ML Soln Generic drug: lactulose (encephalopathy) Take 10 g by mouth daily.   pantoprazole 40 MG tablet Commonly known as: Protonix Take 1 tablet (40 mg total) by mouth daily.   Vitamins/Minerals Tabs Take 1 tablet by mouth daily.         Diet and Activity recommendation: See Discharge Instructions above   Consults obtained -  GI   Major procedures and Radiology Reports - PLEASE review detailed and final reports for all details, in brief -   Endoscopy 03/10/2019 by Dr. Veverly Fells    One benign-appearing, intrinsic severe (stenosis; an endoscope cannot       pass) stenosis was found at the cricopharyngeus. This stenosis measured       5 mm (inner diameter) x 1 cm (in length). The stenosis was traversed       after downsizing scope.      A single 1 mm angioectasia with bleeding was found in the gastric body.       Coagulation for hemostasis using monopolar probe was successful.       Estimated blood loss: none.      The standard EGD scope was not able pass through the radiation stricture       at the [facility]. An ultraslim EGD scope was not available. The decision was       made to use a bronchoscope. The bronchoscope was able to traverse the       stenosis easily. In the gastric lumen there was evidence of a clot in       the body of the stomach. Lavage removed the clot and there was evidence       of active bleeding. The bleeding was isolated to a small bleeding AVM.       APC was employed and it was successful with arresting the bleeding. Impression:               - Benign-appearing esophageal stenosis.                           - A single bleeding angioectasia in the stomach.                            Treated with a monopolar probe.                           - No specimens collected. Recommendation:            - Return patient to hospital ward for ongoing care.                           - Clear liquid diet.                           - Continue present medications.   CT SOFT TISSUE NECK W CONTRAST  Result Date: 03/10/2019 CLINICAL DATA:  History of left laryngeal  cancer post treatment. EXAM: CT NECK WITH CONTRAST TECHNIQUE: Multidetector CT imaging of the neck was performed using the standard protocol following the bolus administration of intravenous contrast. CONTRAST:  64mL OMNIPAQUE IOHEXOL 300 MG/ML  SOLN COMPARISON:  CT neck 01/29/2015 FINDINGS: Pharynx and larynx: Diffuse edema in the larynx bilaterally involving the vocal cords and aryepiglottic folds. This is relatively symmetric. No focal mass. This edema was present previously. Tongue and tonsils negative. Salivary glands: Atrophic submandibular glands bilaterally have progressed since 2017 compatible radiation change. Parotid gland negative bilaterally. Thyroid: No focal lesion. Lymph nodes: Negative for lymphadenopathy in the neck. Vascular: Diffuse atherosclerotic disease. Right carotid stent which appears patent. Left common carotid artery is occluded at the origin, chronic and unchanged. Stent in the proximal left subclavian artery which is patent however there is moderate stenosis above the stent. Both vertebral arteries patent with diffuse atherosclerotic calcification. Limited intracranial: Negative Visualized orbits: Not imaged Mastoids and visualized paranasal sinuses: Mild mucosal edema right maxillary sinus. Otherwise clear. Skeleton: Cervical spondylosis without acute skeletal abnormality. Median sternotomy. Upper chest: Chest findings reported separately Other: None IMPRESSION: Post treatment edema in the larynx bilaterally. No recurrent tumor or adenopathy in the neck. Severe atherosclerotic disease. Right carotid stent patent. Chronic occlusion left common carotid artery. Electronically Signed   By: Franchot Gallo M.D.   On: 03/10/2019  10:12   CT CHEST W CONTRAST  Result Date: 03/10/2019 CLINICAL DATA:  Head and neck cancer staging, left laryngeal cancer EXAM: CT CHEST WITH CONTRAST TECHNIQUE: Multidetector CT imaging of the chest was performed during intravenous contrast administration. CONTRAST:  61mL OMNIPAQUE IOHEXOL 300 MG/ML  SOLN COMPARISON:  PET-CT, 02/14/2015, CT abdomen pelvis, 03/09/2019 FINDINGS: Cardiovascular: Aortic atherosclerosis. Stent of the left subclavian artery origin. The included portion of the left common carotid artery is occluded from the origin. Normal heart size. Extensive 3 vessel coronary artery calcifications and/or stents. Status post median sternotomy and CABG. No pericardial effusion. Mediastinum/Nodes: New, bulky mediastinal lymph nodes, largest pretracheal node measuring 3.8 x 2.6 cm (series 3, image 60) thyroid gland, trachea, and esophagus demonstrate no significant findings. Lungs/Pleura: Moderate centrilobular emphysema. Diffuse bilateral bronchial wall thickening. Clustered centrilobular and tree-in-bud pulmonary nodules of the posterior right upper lobe (series 4, image 47). Small bilateral pleural effusions and associated atelectasis or consolidation. No pleural effusion or pneumothorax. Upper Abdomen: No acute abnormality. Very bulky, heterogeneous, and nodular appearance of the liver with trace ascites. Musculoskeletal: No chest wall mass or suspicious bone lesions identified. IMPRESSION: 1. New, bulky mediastinal lymph nodes, largest pretracheal node measuring 3.8 x 2.6 cm, consistent with metastatic disease. 2. Very bulky, heterogeneous, and nodular appearance of the liver with trace ascites, better evaluated by prior CT of the abdomen and pelvis and consistent with metastatic disease. 3. Small bilateral pleural effusions and associated atelectasis or consolidation. 4. Clustered centrilobular and tree-in-bud pulmonary nodules of the posterior right upper lobe, consistent with atypical infection,  particularly including atypical mycobacteria. 5.  Emphysema (ICD10-J43.9). 6. Coronary artery disease.  Aortic Atherosclerosis (ICD10-I70.0). Electronically Signed   By: Eddie Candle M.D.   On: 03/10/2019 10:24   CT ABDOMEN PELVIS W CONTRAST  Result Date: 03/09/2019 CLINICAL DATA:  Abdominal pain. EXAM: CT ABDOMEN AND PELVIS WITH CONTRAST TECHNIQUE: Multidetector CT imaging of the abdomen and pelvis was performed using the standard protocol following bolus administration of intravenous contrast. CONTRAST:  161mL OMNIPAQUE IOHEXOL 300 MG/ML  SOLN COMPARISON:  08/22/2014 FINDINGS: Lower chest: Mild atelectasis in the lung bases. No pleural effusion.  Three-vessel coronary atherosclerosis. Hepatobiliary: New marked heterogeneous enhancement throughout the liver which is now diffusely enlarged and nodular. Unremarkable gallbladder. No biliary dilatation. Pancreas: Unremarkable. Spleen: Unremarkable. Adrenals/Urinary Tract: Unremarkable adrenal glands. Excreted contrast in the renal collecting systems limiting assessment for calculi. No hydronephrosis. Subcentimeter hypodensity in the upper pole of the left kidney, unchanged and too small to fully characterize. Excreted contrast in the ureters and bladder. Moderate bladder distension. Stomach/Bowel: The stomach is unremarkable. Sequelae of previous sigmoid colectomy are again identified with interval colostomy takedown and reanastomosis. There is no evidence of bowel obstruction. There is submucosal fat deposition in the ascending colon and hepatic flexure likely reflecting the sequelae of prior inflammation. The appendix is unremarkable. Vascular/Lymphatic: Diffuse abdominal aortic atherosclerosis without aneurysm. No enlarged lymph nodes. Reproductive: Unremarkable prostate. Other: Fat containing left lower quadrant ventral hernia at the site of the prior colostomy. Small fat containing epigastric and left inguinal hernias. Trace perihepatic free fluid.  Musculoskeletal: No acute osseous abnormality or suspicious osseous lesion. IMPRESSION: 1. New marked heterogeneity and enlargement of the liver consistent with diffuse metastases. 2. Fat containing left lower quadrant hernia at the site of a previous colostomy. 3. Small fat containing epigastric and left inguinal hernias. 4. Aortic Atherosclerosis (ICD10-I70.0). Electronically Signed   By: [place] Bores M.D.   On: 03/09/2019 17:37    Micro Results    Recent Results (from the past 240 hour(s))  SARS CORONAVIRUS 2 (TAT 6-24 HRS) Nasopharyngeal Nasopharyngeal Swab     Status: None   Collection Time: 03/09/19  1:33 PM   Specimen: Nasopharyngeal Swab  Result Value Ref Range Status   SARS Coronavirus 2 NEGATIVE NEGATIVE Final    Comment: (NOTE) SARS-CoV-2 target nucleic acids are NOT DETECTED. The SARS-CoV-2 RNA is generally detectable in upper and lower respiratory specimens during the acute phase of infection. Negative results do not preclude SARS-CoV-2 infection, do not rule out co-infections with other pathogens, and should not be used as the sole basis for treatment or other patient management decisions. Negative results must be combined with clinical observations, patient history, and epidemiological information. The expected result is Negative. Fact Sheet for Patients: SugarRoll.be Fact Sheet for Healthcare Providers: https://www.woods-mathews.com/ This test is not yet approved or cleared by the [place] FDA and  has been authorized for detection and/or diagnosis of SARS-CoV-2 by FDA under an Emergency Use Authorization (EUA). This EUA will remain  in effect (meaning this test can be used) for the duration of the COVID-19 declaration under Section 56 4(b)(1) of the Act, 21 U.S.C. section 360bbb-3(b)(1), unless the authorization is terminated or revoked sooner. Performed at [facility], [place] [address], [place], [place] [address]          Today   Subjective:   [name] today has no headache,no chest abdominal pain,no new weakness tingling or numbness, feels much better wants to go home today.  No Vomiting or diarrhea.  Objective:   Blood pressure 126/84, pulse 86, temperature 98.3 F (36.8 C), temperature source Oral, resp. rate 19, height 5\' 7"  (1.702 m), weight 67.6 kg, SpO2 91 %.   Intake/Output Summary (Last 24 hours) at 03/12/2019 1220 Last data filed at 03/12/2019 0357 Gross per 24 hour  Intake 240 ml  Output 450 ml  Net -210 ml    Exam Awake Alert, Oriented x 3, No new F.N deficits, Normal affect Symmetrical Chest wall movement, Good air movement bilaterally, CTAB RRR,No Gallops,Rubs or new Murmurs, No Parasternal Heave +ve B.Sounds, Abd Soft,  Non tender, No rebound -guarding or rigidity. No Cyanosis, Clubbing or edema, No new Rash or bruise  Data Review   CBC w Diff:  Lab Results  Component Value Date   WBC 11.4 (H) 03/12/2019   HGB 9.8 (L) 03/12/2019   HGB 11.6 (L) 11/06/2014   HCT 29.2 (L) 03/12/2019   HCT 36.0 (L) 11/06/2014   PLT 269 03/12/2019   PLT 266 11/06/2014   LYMPHOPCT 10.1 (L) 11/06/2014   MONOPCT 9.0 11/06/2014   EOSPCT 1.5 11/06/2014   BASOPCT 0.4 11/06/2014    CMP:  Lab Results  Component Value Date   NA 133 (L) 03/12/2019   NA 139 11/06/2014   K 3.9 03/12/2019   K 4.2 11/06/2014   CL 97 (L) 03/12/2019   CO2 26 03/12/2019   CO2 29 11/06/2014   BUN 8 03/12/2019   BUN 16.7 11/06/2014   CREATININE 0.92 03/12/2019   CREATININE 0.9 11/06/2014   PROT 5.1 (L) 03/11/2019   PROT 6.9 11/06/2014   ALBUMIN 2.2 (L) 03/11/2019   ALBUMIN 3.3 (L) 11/06/2014   BILITOT 2.0 (H) 03/11/2019   BILITOT <0.30 11/06/2014   ALKPHOS 581 (H) 03/11/2019   ALKPHOS 138 11/06/2014   AST 313 (H) 03/11/2019   AST 29 11/06/2014   ALT 151 (H) 03/11/2019   ALT 34 11/06/2014  .   Total Time in preparing paper work, data evaluation and todays exam - 3 minutes  Phillips Climes M.D on 03/12/2019 at 12:20 PM  [facility]   Office  [phone]

## 2019-03-12 NOTE — Plan of Care (Signed)

## 2019-03-14 ENCOUNTER — Encounter (HOSPITAL_COMMUNITY): Payer: Self-pay | Admitting: Emergency Medicine

## 2019-03-14 ENCOUNTER — Other Ambulatory Visit: Payer: Self-pay

## 2019-03-14 ENCOUNTER — Inpatient Hospital Stay (HOSPITAL_COMMUNITY)
Admission: EM | Admit: 2019-03-14 | Discharge: 2019-04-14 | DRG: 312 | Disposition: E | Payer: Medicare Other | Attending: Family Medicine | Admitting: Family Medicine

## 2019-03-14 ENCOUNTER — Observation Stay (HOSPITAL_COMMUNITY): Payer: Medicare Other

## 2019-03-14 DIAGNOSIS — Z79899 Other long term (current) drug therapy: Secondary | ICD-10-CM

## 2019-03-14 DIAGNOSIS — R42 Dizziness and giddiness: Secondary | ICD-10-CM

## 2019-03-14 DIAGNOSIS — R630 Anorexia: Secondary | ICD-10-CM | POA: Diagnosis present

## 2019-03-14 DIAGNOSIS — R197 Diarrhea, unspecified: Secondary | ICD-10-CM | POA: Diagnosis present

## 2019-03-14 DIAGNOSIS — R531 Weakness: Secondary | ICD-10-CM

## 2019-03-14 DIAGNOSIS — Z951 Presence of aortocoronary bypass graft: Secondary | ICD-10-CM

## 2019-03-14 DIAGNOSIS — Z888 Allergy status to other drugs, medicaments and biological substances status: Secondary | ICD-10-CM

## 2019-03-14 DIAGNOSIS — R000 Tachycardia, unspecified: Secondary | ICD-10-CM | POA: Diagnosis present

## 2019-03-14 DIAGNOSIS — F329 Major depressive disorder, single episode, unspecified: Secondary | ICD-10-CM | POA: Diagnosis present

## 2019-03-14 DIAGNOSIS — R7989 Other specified abnormal findings of blood chemistry: Secondary | ICD-10-CM

## 2019-03-14 DIAGNOSIS — F1020 Alcohol dependence, uncomplicated: Secondary | ICD-10-CM | POA: Diagnosis present

## 2019-03-14 DIAGNOSIS — Z6824 Body mass index (BMI) 24.0-24.9, adult: Secondary | ICD-10-CM

## 2019-03-14 DIAGNOSIS — K222 Esophageal obstruction: Secondary | ICD-10-CM | POA: Diagnosis present

## 2019-03-14 DIAGNOSIS — Z95820 Peripheral vascular angioplasty status with implants and grafts: Secondary | ICD-10-CM

## 2019-03-14 DIAGNOSIS — J9600 Acute respiratory failure, unspecified whether with hypoxia or hypercapnia: Secondary | ICD-10-CM

## 2019-03-14 DIAGNOSIS — C787 Secondary malignant neoplasm of liver and intrahepatic bile duct: Secondary | ICD-10-CM | POA: Diagnosis present

## 2019-03-14 DIAGNOSIS — Z823 Family history of stroke: Secondary | ICD-10-CM

## 2019-03-14 DIAGNOSIS — I739 Peripheral vascular disease, unspecified: Secondary | ICD-10-CM | POA: Diagnosis present

## 2019-03-14 DIAGNOSIS — Z8521 Personal history of malignant neoplasm of larynx: Secondary | ICD-10-CM

## 2019-03-14 DIAGNOSIS — D638 Anemia in other chronic diseases classified elsewhere: Secondary | ICD-10-CM | POA: Diagnosis present

## 2019-03-14 DIAGNOSIS — I252 Old myocardial infarction: Secondary | ICD-10-CM

## 2019-03-14 DIAGNOSIS — F419 Anxiety disorder, unspecified: Secondary | ICD-10-CM | POA: Diagnosis present

## 2019-03-14 DIAGNOSIS — Z7982 Long term (current) use of aspirin: Secondary | ICD-10-CM

## 2019-03-14 DIAGNOSIS — R778 Other specified abnormalities of plasma proteins: Secondary | ICD-10-CM | POA: Diagnosis present

## 2019-03-14 DIAGNOSIS — K922 Gastrointestinal hemorrhage, unspecified: Secondary | ICD-10-CM | POA: Diagnosis present

## 2019-03-14 DIAGNOSIS — Z66 Do not resuscitate: Secondary | ICD-10-CM | POA: Diagnosis not present

## 2019-03-14 DIAGNOSIS — Y842 Radiological procedure and radiotherapy as the cause of abnormal reaction of the patient, or of later complication, without mention of misadventure at the time of the procedure: Secondary | ICD-10-CM | POA: Diagnosis present

## 2019-03-14 DIAGNOSIS — I951 Orthostatic hypotension: Principal | ICD-10-CM

## 2019-03-14 DIAGNOSIS — Z978 Presence of other specified devices: Secondary | ICD-10-CM

## 2019-03-14 DIAGNOSIS — R140 Abdominal distension (gaseous): Secondary | ICD-10-CM | POA: Diagnosis present

## 2019-03-14 DIAGNOSIS — Z20822 Contact with and (suspected) exposure to covid-19: Secondary | ICD-10-CM | POA: Diagnosis present

## 2019-03-14 DIAGNOSIS — I110 Hypertensive heart disease with heart failure: Secondary | ICD-10-CM | POA: Diagnosis present

## 2019-03-14 DIAGNOSIS — I5042 Chronic combined systolic (congestive) and diastolic (congestive) heart failure: Secondary | ICD-10-CM | POA: Diagnosis present

## 2019-03-14 DIAGNOSIS — K219 Gastro-esophageal reflux disease without esophagitis: Secondary | ICD-10-CM | POA: Diagnosis present

## 2019-03-14 DIAGNOSIS — I469 Cardiac arrest, cause unspecified: Secondary | ICD-10-CM

## 2019-03-14 DIAGNOSIS — R946 Abnormal results of thyroid function studies: Secondary | ICD-10-CM | POA: Diagnosis present

## 2019-03-14 DIAGNOSIS — J9690 Respiratory failure, unspecified, unspecified whether with hypoxia or hypercapnia: Secondary | ICD-10-CM

## 2019-03-14 DIAGNOSIS — K5900 Constipation, unspecified: Secondary | ICD-10-CM | POA: Diagnosis present

## 2019-03-14 DIAGNOSIS — M1990 Unspecified osteoarthritis, unspecified site: Secondary | ICD-10-CM | POA: Diagnosis present

## 2019-03-14 DIAGNOSIS — R578 Other shock: Secondary | ICD-10-CM | POA: Diagnosis not present

## 2019-03-14 DIAGNOSIS — E785 Hyperlipidemia, unspecified: Secondary | ICD-10-CM | POA: Diagnosis present

## 2019-03-14 DIAGNOSIS — T17908A Unspecified foreign body in respiratory tract, part unspecified causing other injury, initial encounter: Secondary | ICD-10-CM

## 2019-03-14 DIAGNOSIS — G9340 Encephalopathy, unspecified: Secondary | ICD-10-CM | POA: Diagnosis not present

## 2019-03-14 DIAGNOSIS — Z923 Personal history of irradiation: Secondary | ICD-10-CM

## 2019-03-14 DIAGNOSIS — I2510 Atherosclerotic heart disease of native coronary artery without angina pectoris: Secondary | ICD-10-CM | POA: Diagnosis present

## 2019-03-14 DIAGNOSIS — Z8674 Personal history of sudden cardiac arrest: Secondary | ICD-10-CM

## 2019-03-14 DIAGNOSIS — I959 Hypotension, unspecified: Secondary | ICD-10-CM

## 2019-03-14 DIAGNOSIS — E874 Mixed disorder of acid-base balance: Secondary | ICD-10-CM | POA: Diagnosis present

## 2019-03-14 DIAGNOSIS — Z87891 Personal history of nicotine dependence: Secondary | ICD-10-CM

## 2019-03-14 DIAGNOSIS — R451 Restlessness and agitation: Secondary | ICD-10-CM | POA: Diagnosis not present

## 2019-03-14 DIAGNOSIS — I255 Ischemic cardiomyopathy: Secondary | ICD-10-CM | POA: Diagnosis present

## 2019-03-14 DIAGNOSIS — R112 Nausea with vomiting, unspecified: Secondary | ICD-10-CM

## 2019-03-14 DIAGNOSIS — Z8249 Family history of ischemic heart disease and other diseases of the circulatory system: Secondary | ICD-10-CM

## 2019-03-14 DIAGNOSIS — T17590A Other foreign object in bronchus causing asphyxiation, initial encounter: Secondary | ICD-10-CM | POA: Diagnosis not present

## 2019-03-14 LAB — COMPREHENSIVE METABOLIC PANEL
ALT: 174 U/L — ABNORMAL HIGH (ref 0–44)
AST: 476 U/L — ABNORMAL HIGH (ref 15–41)
Albumin: 2.3 g/dL — ABNORMAL LOW (ref 3.5–5.0)
Alkaline Phosphatase: 650 U/L — ABNORMAL HIGH (ref 38–126)
Anion gap: 12 (ref 5–15)
BUN: 10 mg/dL (ref 8–23)
CO2: 24 mmol/L (ref 22–32)
Calcium: 8.3 mg/dL — ABNORMAL LOW (ref 8.9–10.3)
Chloride: 95 mmol/L — ABNORMAL LOW (ref 98–111)
Creatinine, Ser: 1.14 mg/dL (ref 0.61–1.24)
GFR calc Af Amer: >60 mL/min (ref >60)
GFR calc non Af Amer: >60 mL/min (ref >60)
Glucose, Bld: 105 mg/dL — ABNORMAL HIGH (ref 70–99)
Potassium: 4.4 mmol/L (ref 3.5–5.1)
Sodium: 131 mmol/L — ABNORMAL LOW (ref 135–145)
Total Bilirubin: 2.1 mg/dL — ABNORMAL HIGH (ref 0.3–1.2)
Total Protein: 5.3 g/dL — ABNORMAL LOW (ref 6.5–8.1)

## 2019-03-14 LAB — TROPONIN I (HIGH SENSITIVITY)
Troponin I (High Sensitivity): 638 ng/L (ref <18)
Troponin I (High Sensitivity): 557 ng/L (ref <18)

## 2019-03-14 LAB — CBC
HCT: 31.8 % — ABNORMAL LOW (ref 39.0–52.0)
Hemoglobin: 10.7 g/dL — ABNORMAL LOW (ref 13.0–17.0)
MCH: 30.7 pg (ref 26.0–34.0)
MCHC: 33.6 g/dL (ref 30.0–36.0)
MCV: 91.4 fL (ref 80.0–100.0)
Platelets: 275 10*3/uL (ref 150–400)
RBC: 3.48 MIL/uL — ABNORMAL LOW (ref 4.22–5.81)
RDW: 18.3 % — ABNORMAL HIGH (ref 11.5–15.5)
WBC: 11.5 10*3/uL — ABNORMAL HIGH (ref 4.0–10.5)
nRBC: 0 % (ref 0.0–0.2)

## 2019-03-14 LAB — URINALYSIS, ROUTINE W REFLEX MICROSCOPIC
Bilirubin Urine: NEGATIVE
Glucose, UA: NEGATIVE mg/dL
Hgb urine dipstick: NEGATIVE
Ketones, ur: 20 mg/dL — AB
Leukocytes,Ua: NEGATIVE
Nitrite: NEGATIVE
Protein, ur: 30 mg/dL — AB
Specific Gravity, Urine: 1.024 (ref 1.005–1.030)
pH: 6 (ref 5.0–8.0)

## 2019-03-14 LAB — LIPASE, BLOOD: Lipase: 44 U/L (ref 11–51)

## 2019-03-14 MED ORDER — SODIUM CHLORIDE 0.9% FLUSH: 3.0000 mL | Freq: Once | INTRAVENOUS | Status: DC

## 2019-03-14 MED ORDER — SODIUM CHLORIDE 0.9 % IV BOLUS
1000.0000 mL | Freq: Once | INTRAVENOUS | Status: AC
  Administered 2019-03-14: 1000 mL via INTRAVENOUS

## 2019-03-14 NOTE — ED Triage Notes (Signed)
Pt arrives via [facility] after being recently discharged on 2/27 after having a upper GI bleed. Pt has been at home with abd pain and vomiting when he try's to eat any food.   116/60 77 20 98& RA CBG 126

## 2019-03-14 NOTE — ED Notes (Signed)
Monica Talman daughter [id number] would like to give some information on the pt

## 2019-03-14 NOTE — ED Notes (Signed)
Pt resting in bed. Pt denies new or worsening complaints. Will continue to monitor. No distress noted. Pt on continuous monitoring via blood pressure, pulse ox, and cardiac monitor.  

## 2019-03-14 NOTE — ED Notes (Signed)
Repeat trop sent to lab

## 2019-03-14 NOTE — ED Notes (Signed)
Shlok Eno son [id number] looking for an update on the pt

## 2019-03-14 NOTE — ED Provider Notes (Signed)
[facility] EMERGENCY DEPARTMENT Provider Note   CSN: [id number] Arrival date & time: 03/15/2019  1246     History Chief Complaint  Patient presents with  . Weakness  . Emesis    [name] is a 64 y.o. male.  The history is provided by the patient and medical records. No language interpreter was used.  Dizziness Quality:  Lightheadedness Severity:  Severe Onset quality:  Gradual Duration:  2 days Timing:  Intermittent Progression:  Waxing and waning Chronicity:  New Context: standing up   Relieved by:  Lying down Worsened by:  Sitting upright and standing up Ineffective treatments:  None tried Associated symptoms: diarrhea, nausea and vomiting   Associated symptoms: no chest pain, no headaches, no palpitations, no shortness of breath, no syncope and no weakness   Risk factors: anemia        Past Medical History:  Diagnosis Date  . Acute encephalopathy, improved 06/20/2013  . Acute MI, troponin > 20, no obvious culprit vessel 06/20/2013  . Anxiety   . Arthritis   . At risk for sudden cardiac death, has lifevest at discharge 06/20/2013  . Carotid stenosis   . Coronary artery disease    s/p CABG 2006; stenting to vein graft 05-2013; NSTEMI 06-2013 in setting of PEA arrest  . Depression   . Difficulty swallowing solids    Pt unable to swallow solid foods  . Dizziness 06/20/2013  . Dyslipidemia 10/09/2013   [place] Study atorvastatin -eIRB # [id number] tablet Take 40 mg by mouth daily.   . Fever, possible aspiration-treated with antibiotics 06/20/2013  . GERD (gastroesophageal reflux disease)   . Heart failure, acute on chronic, systolic and diastolic ([place]) [id number]  . Heart murmur   . History of radiation therapy 09/05/14-10/23/14   larynx 70 Gy  . Hyperlipidemia   . Hypertension   . Hypothermia, induced, post arrest 06/20/2013  . Incisional hernia   . Ischemic cardiomyopathy    echo 06/2013 EF 10-15% (normal 01/2013)  . PAD (peripheral artery disease) ([place])  10/09/2013   Occluded left common carotid and moderate right internal carotid artery stenosis. Previous stent to the left subclavian artery. Right iliac stent. Bilateral 60-99% renal artery stenosis    . Radiation-induced esophageal stricture   . Shock liver 06/20/2013  . Shortness of breath dyspnea    with exertion  . 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 Subclavian artery stenosis, left (HCC)    s/p stenting 2006  . Tobacco abuse    >100 pack year history   . Weakness due to cardiac arrest 06/20/2013  . Wears glasses     Patient Active Problem List   Diagnosis Date Noted  . Upper GI bleed 03/09/2019  . Elevated LFTs 03/09/2019  . Liver metastases ([place]) 03/09/2019  . Anxiety 09/30/2018  . Foot pain 09/30/2018  . HLD (hyperlipidemia) 09/30/2018  . Hypertension 09/30/2018  . Plantar fasciitis 09/30/2018  . Subclavian artery stenosis, left ([place]) 09/30/2018  . Tobacco abuse 09/30/2018  . Occlusion of left carotid artery 02/05/2017  . Coronary artery disease involving coronary bypass graft of native heart without angina pectoris 02/05/2017  . Carotid stenosis 07/10/2016  . CAD S/P percutaneous coronary angioplasty 07/07/2016  . Diverticulitis 06/17/2016  . S/P colostomy takedown 06/13/2015  . History of external beam radiation therapy 04/05/2015  . Aspiration into respiratory tract   . Protein-calorie malnutrition, severe ([place]) 10/13/2014  . Palliative care encounter 10/13/2014  . Pre-operative cardiovascular examination 10/13/2014  .  Pain in throat 10/13/2014  . Increased oropharyngeal secretions   . Laryngeal cancer ([place])   . Skin ulceration ([place]) 10/11/2014  . Fever in adult 10/11/2014  . Dysphagia 10/11/2014  . Infection, respiratory tract 10/10/2014  . Skin sore 10/10/2014  . Protein calorie malnutrition ([place]) 09/21/2014  . Hypotension due to drugs 09/13/2014  . Throat pain in adult 09/06/2014  . Other emphysema ([place])   . Cardiac ischemia   . Perforated  bowel ([place])   . Alcohol abuse   . Peritonitis ([place])   . Hypokalemia   . Hypomagnesemia   . Troponin level elevated   . History of small bowel obstruction 08/05/2014  . NSTEMI (non-ST elevated myocardial infarction) ([place]) 08/05/2014  . Drug reaction   . Hx of CABG x 1 '06   . Sore throat 08/01/2014  . Cancer associated pain 08/01/2014  . 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 Chronic combined systolic and diastolic heart failure ([place]) 12/26/2013  . Dyslipidemia 10/09/2013  . PAD (peripheral artery disease) ([place]) 10/09/2013  . At risk for sudden cardiac death, has lifevest at discharge 06/20/2013  . Ischemic cardiomyopathy, EF 45% 06/20/2013  . Shock liver 06/20/2013  . Acute MI, troponin > 20, no obvious culprit vessel, NSTEMI anterior wall 06/20/2013  . Hypothermia, induced, post arrest, initially then stopped 06/20/2013  . Acute encephalopathy, improved 06/20/2013  . Dizziness 06/20/2013  . Weakness due to cardiac arrest 06/20/2013  . Fever, possible aspiration-treated with antibiotics 06/20/2013  . Anemia in chronic illness 06/17/2013  . NSVT (nonsustained ventricular tachycardia) ([place]) 06/17/2013  . CAD-hx of CABG '06, DES LCX 01/2013, DES SVG-LAD 5/21/5 at [facility] 06/17/2013  . Acute respiratory failure with hypoxia, intubated on admit, self-extubatedn 06/13/13 06/10/2013  . Cardiogenic shock ([place]) 06/10/2013  . Cardiogenic pulmonary edema, improved 06/10/2013  . History of cardiac arrest-2015 06/09/2013    Past Surgical History:  Procedure Laterality Date  . CARDIAC CATHETERIZATION    . CAROTID ANGIOGRAPHY  07/10/2016   Procedure: Carotid Angiography;  Surgeon: Lorretta Harp, MD;  Location: [facility] LAB;  Service: Cardiovascular;;  Rt. Carotid  . CAROTID PTA/STENT INTERVENTION N/A 07/10/2016   Procedure: Carotid PTA/Stent Intervention;  Surgeon: Lorretta Harp, MD;  Location: [facility] LAB;  Service: Cardiovascular;  Laterality: N/A;  Rt.  External Carotid  . COLON SURGERY    . COLONOSCOPY    . COLOSTOMY    . COLOSTOMY TAKEDOWN  06/13/2015  . COLOSTOMY TAKEDOWN N/A 06/13/2015   Procedure: COLOSTOMY TAKEDOWN;  Surgeon: Coralie Keens, MD;  Location: [facility];  Service: General;  Laterality: N/A;  . CORONARY ARTERY BYPASS GRAFT  09/2004   SVG to LAD (at [facility])  . DIRECT LARYNGOSCOPY N/A 07/06/2014   Procedure: DIRECT LARYNGOSCOPY AND ESOPAGOSCOPY WITH BIOPSY;  Surgeon: [name], MD;  Location: [facility] OR;  Service: ENT;  Laterality: N/A;  . ESOPHAGOGASTRODUODENOSCOPY (EGD) WITH PROPOFOL N/A 03/10/2019   Procedure: ESOPHAGOGASTRODUODENOSCOPY (EGD) WITH PROPOFOL;  Surgeon: Carol Ada, MD;  Location: [facility];  Service: Endoscopy;  Laterality: N/A;  . ESOPHAGOSCOPY WITH DILITATION N/A 10/31/2015   Procedure: ESOPHAGOSCOPY WITH DILITATION;  Surgeon: [name], MD;  Location: [facility];  Service: ENT;  Laterality: N/A;  . HOT HEMOSTASIS N/A 03/10/2019   Procedure: HOT HEMOSTASIS (ARGON PLASMA COAGULATION/BICAP);  Surgeon: Carol Ada, MD;  Location: [facility];  Service: Endoscopy;  Laterality: N/A;  . ILIAC ARTERY STENT  07/24/2008   8x6 Smart nitinol self-expanding stent to origin of ilaci down into  external iliac crossing the hypogastric artery (Dr. Adora Fridge)  . [place] REPAIR  06/13/2015  . LAPAROTOMY N/A 08/05/2014   Procedure: EXPLORATORY LAPAROTOMY WITH SIGMOIDOTOMY WITH COLECTOMY & COLOSTOMY;  Surgeon: Coralie Keens, MD;  Location: [facility];  Service: General;  Laterality: N/A;  . LEFT HEART CATHETERIZATION WITH CORONARY/GRAFT ANGIOGRAM N/A 06/15/2013   Procedure: LEFT HEART CATHETERIZATION WITH Beatrix Fetters;  Surgeon: Sinclair Grooms, MD;  Location: [facility] CATH LAB;  Service: Cardiovascular;  Laterality: N/A;  . LOWER EXTREMITY ARTERIAL DOPPLER  08/2008   right and left ABI - mild arterial insufficiency at rest; R CIA/stent with 50-69% narrowing, L CIA with increased velocities (50-69% diameter reduction)  . NM  MYOCAR PERF WALL MOTION  06/2008   persantine myoview - normal pattern of systolic thickening/wall motion/perfusion;  . PEG TUBE REMOVAL    . PERIPHERAL VASCULAR CATHETERIZATION  05/2013   stent to SVG - LAD vein graft at [facility]  . PERIPHERAL VASCULAR CATHETERIZATION N/A 05/08/2015   Procedure: Aortic Arch Angiography;  Surgeon: Serafina Mitchell, MD;  Location: [facility] CV LAB;  Service: Cardiovascular;  Laterality: N/A;  . PERIPHERAL VASCULAR CATHETERIZATION N/A 05/08/2015   Procedure: Carotid Angiography;  Surgeon: Serafina Mitchell, MD;  Location: [facility] CV LAB;  Service: Cardiovascular;  Laterality: N/A;  . PERIPHERAL VASCULAR CATHETERIZATION  06/13/2015   Procedure: PORTA CATH REMOVAL;  Surgeon: Coralie Keens, MD;  Location: [facility];  Service: General;;  . PORT-A-CATH REMOVAL  06/13/2015  . RENAL ARTERY DOPPLER  05/2008   right and left prox renal arteries with narrowing and increased velocities (60-99% diameter reduction)  . SUBCLAVIAN ANGIOGRAM Left 08/20/2004   8x41mm Genesis balloon mounted stent       Family History  Problem Relation Age of Onset  . Heart disease Maternal Aunt   . Heart attack Mother   . Stroke Mother   . Cancer Father   . Stroke Sister   . Heart Problems Sister     Social History   Tobacco Use  . Smoking status: Former Smoker    Packs/day: 4.00    Years: 45.00    Pack years: 180.00    Types: Cigarettes    Quit date: 06/09/2013    Years since quitting: 5.7  . Smokeless tobacco: Former Network engineer Use Topics  . Alcohol use: Yes    Alcohol/week: 21.0 standard drinks    Types: 21 Cans of beer per week  . Drug use: No    Home Medications Prior to Admission medications   Medication Sig Start Date End Date Taking? Authorizing Provider  ALPRAZolam Duanne Moron) 0.5 MG tablet Take 0.5 mg by mouth 2 (two) times daily.     [provider]  aspirin 81 MG EC tablet Take 1 tablet (81 mg total) by mouth daily. Resume on 03/15/2019 03/15/19   Elgergawy,  Silver Huguenin, MD  buPROPion (WELLBUTRIN) 100 MG tablet Take 100 mg by mouth 3 (three) times daily.     [provider]  carvedilol (COREG) 6.25 MG tablet TAKE 1 TABLET(6.25 MG) BY MOUTH TWICE DAILY WITH A MEAL Patient taking differently: Take 6.25 mg by mouth 2 (two) times daily with a meal.  07/02/16   Croitoru, Mihai, MD  GENERLAC 10 GM/15ML SOLN Take 10 g by mouth daily. 02/19/19   [provider]  pantoprazole (PROTONIX) 40 MG tablet Take 1 tablet (40 mg total) by mouth daily. 03/12/19 04/11/19  Elgergawy, Silver Huguenin, MD  Vitamins/Minerals TABS Take 1 tablet by  mouth daily.    [provider]    Allergies    Cetuximab  Review of Systems   Review of Systems  Constitutional: Positive for fatigue. Negative for chills, diaphoresis and fever.  HENT: Negative for congestion.   Eyes: Negative for visual disturbance.  Respiratory: Negative for cough, chest tightness, shortness of breath and wheezing.   Cardiovascular: Negative for chest pain, palpitations and syncope.  Gastrointestinal: Positive for diarrhea, nausea and vomiting. Negative for abdominal distention, abdominal pain and constipation.  Genitourinary: Negative for dysuria, flank pain and frequency.  Musculoskeletal: Negative for back pain, neck pain and neck stiffness.  Neurological: Positive for light-headedness. Negative for dizziness, syncope, facial asymmetry, speech difficulty, weakness, numbness and headaches.  Psychiatric/Behavioral: Negative for agitation.  All other systems reviewed and are negative.   Physical Exam Updated Vital Signs BP 122/74 (BP Location: Right Arm)   Pulse 86   Temp 98.3 F (36.8 C) (Oral)   Resp 19   Ht 5\' 7"  (1.702 m)   Wt 67.6 kg   SpO2 98%   BMI 23.34 kg/m   Physical Exam Vitals and nursing note reviewed.  Constitutional:      General: He is not in acute distress.    Appearance: He is well-developed. He is not ill-appearing, toxic-appearing or diaphoretic.  HENT:       Head: Normocephalic and atraumatic.     Nose: Nose normal. No congestion or rhinorrhea.     Mouth/Throat:     Mouth: Mucous membranes are dry.     Pharynx: No oropharyngeal exudate or posterior oropharyngeal erythema.  Eyes:     Extraocular Movements: Extraocular movements intact.     Conjunctiva/sclera: Conjunctivae normal.     Pupils: Pupils are equal, round, and reactive to light.  Cardiovascular:     Rate and Rhythm: Normal rate and regular rhythm.     Pulses: Normal pulses.     Heart sounds: No murmur.  Pulmonary:     Effort: Pulmonary effort is normal. No respiratory distress.     Breath sounds: Normal breath sounds. No wheezing, rhonchi or rales.  Abdominal:     General: Abdomen is flat.     Palpations: Abdomen is soft.     Tenderness: There is no abdominal tenderness. There is no right CVA tenderness, left CVA tenderness, guarding or rebound.  Musculoskeletal:     Cervical back: Neck supple. No tenderness.  Skin:    General: Skin is warm and dry.     Capillary Refill: Capillary refill takes less than 2 seconds.     Coloration: Skin is pale.     Findings: No rash.  Neurological:     General: No focal deficit present.     Mental Status: He is alert.     Sensory: No sensory deficit.     Motor: No weakness.     ED Results / Procedures / Treatments   Labs (all labs ordered are listed, but only abnormal results are displayed) Labs Reviewed  COMPREHENSIVE METABOLIC PANEL - Abnormal; Notable for the following components:      Result Value   Sodium 131 (*)    Chloride 95 (*)    Glucose, Bld 105 (*)    Calcium 8.3 (*)    Total Protein 5.3 (*)    Albumin 2.3 (*)    AST 476 (*)    ALT 174 (*)    Alkaline Phosphatase 650 (*)    Total Bilirubin 2.1 (*)    All other components within  normal limits  CBC - Abnormal; Notable for the following components:   WBC 11.5 (*)    RBC 3.48 (*)    Hemoglobin 10.7 (*)    HCT 31.8 (*)    RDW 18.3 (*)    All other components  within normal limits  URINALYSIS, ROUTINE W REFLEX MICROSCOPIC - Abnormal; Notable for the following components:   Color, Urine AMBER (*)    APPearance HAZY (*)    Ketones, ur 20 (*)    Protein, ur 30 (*)    Bacteria, UA RARE (*)    All other components within normal limits  TROPONIN I (HIGH SENSITIVITY) - Abnormal; Notable for the following components:   Troponin I (High Sensitivity) 638 (*)    All other components within normal limits  TROPONIN I (HIGH SENSITIVITY) - Abnormal; Notable for the following components:   Troponin I (High Sensitivity) 557 (*)    All other components within normal limits  SARS CORONAVIRUS 2 (TAT 6-24 HRS)  LIPASE, BLOOD    EKG EKG Interpretation  Date/Time:  Monday March 14 2019 18:57:53 EST Ventricular Rate:  82 PR Interval:    QRS Duration: 109 QT Interval:  417 QTC Calculation: 487 R Axis:   32 Text Interpretation: Sinus rhythm Probable anteroseptal infarct, old Minimal ST depression, diffuse leads When compared to prior, no signifidcant changes seen. No STEMI Confirmed by Antony Blackbird [phone]) on 03/30/2019 7:10:14 PM   Radiology CT RENAL STONE STUDY  Result Date: 03/15/2019 CLINICAL DATA:  Abdominal pain EXAM: CT ABDOMEN AND PELVIS WITHOUT CONTRAST TECHNIQUE: Multidetector CT imaging of the abdomen and pelvis was performed following the standard protocol without IV contrast. COMPARISON:  03/09/2019 FINDINGS: Lower chest: Small bilateral pleural effusions are noted with associated atelectatic changes. These are new from the prior exam. Diffuse decreased attenuation is noted within the cardiac blood pool consistent with anemia. Hepatobiliary: Given the lack of IV contrast the diffuse heterogeneity of the liver is less well appreciated although changes consistent with the known metastatic disease throughout the liver are again identified. Vicarious excretion of contrast is noted within the gallbladder. Pancreas: Unremarkable. No pancreatic ductal  dilatation or surrounding inflammatory changes. Spleen: Normal in size without focal abnormality. Adrenals/Urinary Tract: Adrenal glands are within normal limits. Kidneys show no renal calculi or obstructive change. The bladder is well distended. Stomach/Bowel: Postsurgical changes are noted in the sigmoid colon. No obstructive changes are noted. The appendix is within normal limits. No small bowel abnormality is seen. Vascular/Lymphatic: Diffuse vascular calcifications are noted. Changes consistent with right iliac artery stenting are noted. Reproductive: Prostate is unremarkable. Other: Mild free fluid is noted within the pelvis this is increased slightly in the interval prior exam. Fat containing left lower quadrant hernia is noted at the area of previous colostomy. This is stable from the prior exam. Musculoskeletal: Degenerative changes of lumbar spine are seen. IMPRESSION: Stable changes in the liver consistent with known hepatic metastatic disease. New bilateral pleural effusions with associated atelectatic changes. Changes suggestive of anemia in the cardiac blood pool. Postsurgical changes as described above. Electronically Signed   By: Inez Catalina M.D.   On: 03/15/2019 00:32    Procedures Procedures (including critical care time)  CRITICAL CARE Performed by: Gwenyth Allegra Maeby Vankleeck Total critical care time: 35 minutes Critical care time was exclusive of separately billable procedures and treating other patients. Near syncope/lightheadedness with significant elevated troponin. Critical care was necessary to treat or prevent imminent or life-threatening deterioration. Critical care was time spent personally  by me on the following activities: development of treatment plan with patient and/or surrogate as well as nursing, discussions with consultants, evaluation of patient's response to treatment, examination of patient, obtaining history from patient or surrogate, ordering and performing treatments  and interventions, ordering and review of laboratory studies, ordering and review of radiographic studies, pulse oximetry and re-evaluation of patient's condition.   Medications Ordered in ED Medications  sodium chloride flush (NS) 0.9 % injection 3 mL (has no administration in time range)  sodium chloride 0.9 % bolus 1,000 mL (0 mLs Intravenous Stopped 04/04/2019 2052)  sodium chloride 0.9 % bolus 1,000 mL (0 mLs Intravenous Stopped 03/15/19 0110)    ED Course  I have reviewed the triage vital signs and the nursing notes.  Pertinent labs & imaging results that were available during my care of the patient were reviewed by me and considered in my medical decision making (see chart for details).    MDM Rules/Calculators/A&P                      [name] is a 64 y.o. male with a past medical history significant for CAD status post CABG, CHF, laryngeal cancer with liver metastasis and elevated liver enzymes, prior cardiac arrest, dyslipidemia, carotid disease, and recent admission for upper GI bleed who presents with lightheadedness, nausea, vomiting, diarrhea.  Patient reports that he was discharged in the hospital 2 days ago after he had endoscopy to stop upper GI bleeding.  He reports that he did not have a bowel movement during his stay in the hospital and then after discharge 2 days ago, he wants to have a bowel movement.  He reports that he took MiraLAX to try and have a bowel movement and then had numerous episodes of very loose stools that was dark.  He reports that he has had countless bowel movements yesterday and somewhat improving today but he has felt very lightheaded and fatigued.  He denies any chest pain, palpitations, or shortness of breath but he did have some vomiting yesterday.  He reports it was that dark substance again.  He denies any syncopal episodes but home health checked on him yesterday and he was found her blood pressure in the 90s.  He is try to stay hydrated but he  is concerned about dehydration given the diarrhea.  He denies significant abdominal pain or chest pain.  On exam, lungs are clear and chest is nontender.  Abdomen has surgical scars and a small hernia but it is nontender.  I did hear bowel sounds.  Flank and back nontender.  Patient's blood pressure was around 123456 systolic on my initial evaluation.  Patient is pale.  Patient had screening blood work to evaluate for worsened anemia or current GI bleed.  His hemoglobin actually improved since his discharge however this may also be reflective of some hemoconcentration with his dehydration and diarrhea.  His lipase is not elevated.  His LFTs are elevated similar to prior.  He does have decreased sodium and chloride likely reflective of the nausea and vomiting yesterday and his diarrhea.  Given the patient's lack of new abdominal tenderness, low suspicion for a new obstruction or other surgical abnormalities abdomen.  With his hemoglobin improving, I doubt he is having worsened GI bleed.  I am more concerned about dehydration and orthostasis in his nursing left-sided.  Will get EKG and monitor on telemetry and check orthostatics.  We will then give him fluids and recheck.  Patient is feeling better and we have now shown he is not worsened anemic, he will likely be stable for discharge home to continue outpatient follow-up.  We will however get an EKG, monitor on telemetry, and get troponin given the near syncopal and very lightheaded episodes with his recent anemia.  11:06 PM Patient's work-up has nearly completed showing improvement in his hemoglobin but in the setting of this lightheadedness and near syncope and low blood pressure, we did check troponins and they were very elevated on arrival at 638 and improving on recheck at 557.  I am concerned that his near syncopal and lightheaded episode may have contributed to some demand ischemia causing the troponin leak.  On reassessment, patient was still  hypotensive after liter of fluids and his last blood pressure was 93 systolic on my assessment.  He also had drop into the 80s when he checked his orthostatics.  Given the continued hypotension, the elevated troponin, and his cardiac and GI history, I do not feel safe with him going home at this time.  Patient will likely need further hemoglobin trending, troponin trending, and blood pressure monitoring overnight.  Final Clinical Impression(s) / ED Diagnoses Final diagnoses:  Hypotension, unspecified hypotension type  Orthostatic hypotension  Elevated troponin  Lightheadedness  Nausea vomiting and diarrhea    Rx / DC Orders ED Discharge Orders    None     Clinical Impression: 1. Hypotension, unspecified hypotension type   2. Orthostatic hypotension   3. Elevated troponin   4. Lightheadedness   5. Nausea vomiting and diarrhea     Disposition: Admit  This note was prepared with assistance of Dragon voice recognition software. Occasional wrong-word or sound-a-like substitutions may have occurred due to the inherent limitations of voice recognition software.     Drey Shaff, Gwenyth Allegra, MD 03/15/19 [phone]

## 2019-03-15 ENCOUNTER — Encounter (HOSPITAL_COMMUNITY): Payer: Self-pay | Admitting: Internal Medicine

## 2019-03-15 DIAGNOSIS — I9589 Other hypotension: Secondary | ICD-10-CM

## 2019-03-15 DIAGNOSIS — R531 Weakness: Secondary | ICD-10-CM

## 2019-03-15 DIAGNOSIS — I959 Hypotension, unspecified: Secondary | ICD-10-CM | POA: Diagnosis present

## 2019-03-15 DIAGNOSIS — D638 Anemia in other chronic diseases classified elsewhere: Secondary | ICD-10-CM

## 2019-03-15 DIAGNOSIS — R7989 Other specified abnormal findings of blood chemistry: Secondary | ICD-10-CM

## 2019-03-15 DIAGNOSIS — E861 Hypovolemia: Secondary | ICD-10-CM

## 2019-03-15 DIAGNOSIS — R778 Other specified abnormalities of plasma proteins: Secondary | ICD-10-CM

## 2019-03-15 DIAGNOSIS — R197 Diarrhea, unspecified: Secondary | ICD-10-CM

## 2019-03-15 DIAGNOSIS — I951 Orthostatic hypotension: Principal | ICD-10-CM

## 2019-03-15 LAB — BASIC METABOLIC PANEL
Anion gap: 10 (ref 5–15)
BUN: 12 mg/dL (ref 8–23)
CO2: 23 mmol/L (ref 22–32)
Calcium: 8 mg/dL — ABNORMAL LOW (ref 8.9–10.3)
Chloride: 101 mmol/L (ref 98–111)
Creatinine, Ser: 1.04 mg/dL (ref 0.61–1.24)
GFR calc Af Amer: >60 mL/min (ref >60)
GFR calc non Af Amer: >60 mL/min (ref >60)
Glucose, Bld: 95 mg/dL (ref 70–99)
Potassium: 4.2 mmol/L (ref 3.5–5.1)
Sodium: 134 mmol/L — ABNORMAL LOW (ref 135–145)

## 2019-03-15 LAB — LACTIC ACID, PLASMA
Lactic Acid, Venous: 2.7 mmol/L (ref 0.5–1.9)
Lactic Acid, Venous: 1.8 mmol/L (ref 0.5–1.9)

## 2019-03-15 LAB — CBC
HCT: 29.8 % — ABNORMAL LOW (ref 39.0–52.0)
HCT: 30.4 % — ABNORMAL LOW (ref 39.0–52.0)
Hemoglobin: 10.6 g/dL — ABNORMAL LOW (ref 13.0–17.0)
Hemoglobin: 10.5 g/dL — ABNORMAL LOW (ref 13.0–17.0)
MCH: 31.3 pg (ref 26.0–34.0)
MCH: 30.7 pg (ref 26.0–34.0)
MCHC: 35.6 g/dL (ref 30.0–36.0)
MCHC: 34.5 g/dL (ref 30.0–36.0)
MCV: 87.9 fL (ref 80.0–100.0)
MCV: 88.9 fL (ref 80.0–100.0)
Platelets: 244 10*3/uL (ref 150–400)
Platelets: 258 10*3/uL (ref 150–400)
RBC: 3.39 MIL/uL — ABNORMAL LOW (ref 4.22–5.81)
RBC: 3.42 MIL/uL — ABNORMAL LOW (ref 4.22–5.81)
RDW: 17.6 % — ABNORMAL HIGH (ref 11.5–15.5)
RDW: 17.9 % — ABNORMAL HIGH (ref 11.5–15.5)
WBC: 11.3 10*3/uL — ABNORMAL HIGH (ref 4.0–10.5)
WBC: 11.5 10*3/uL — ABNORMAL HIGH (ref 4.0–10.5)
nRBC: 0 % (ref 0.0–0.2)
nRBC: 0 % (ref 0.0–0.2)

## 2019-03-15 LAB — SARS CORONAVIRUS 2 (TAT 6-24 HRS): SARS Coronavirus 2: NEGATIVE

## 2019-03-15 LAB — TROPONIN I (HIGH SENSITIVITY)
Troponin I (High Sensitivity): 642 ng/L (ref <18)
Troponin I (High Sensitivity): 557 ng/L (ref <18)

## 2019-03-15 LAB — TSH: TSH: 11.066 u[IU]/mL — ABNORMAL HIGH (ref 0.350–4.500)

## 2019-03-15 LAB — T4, FREE: Free T4: 0.92 ng/dL (ref 0.61–1.12)

## 2019-03-15 LAB — MAGNESIUM: Magnesium: 2 mg/dL (ref 1.7–2.4)

## 2019-03-15 MED ORDER — SODIUM CHLORIDE 0.9 % IV SOLN: INTRAVENOUS | Status: AC

## 2019-03-15 MED ORDER — ONDANSETRON HCL 4 MG PO TABS: 4.0000 mg | ORAL_TABLET | Freq: Four times a day (QID) | ORAL | Status: DC | PRN

## 2019-03-15 MED ORDER — ACETAMINOPHEN 325 MG PO TABS: 650.0000 mg | ORAL_TABLET | Freq: Four times a day (QID) | ORAL | Status: DC | PRN

## 2019-03-15 MED ORDER — ENSURE ENLIVE PO LIQD
237.0000 mL | Freq: Two times a day (BID) | ORAL | Status: DC
  Administered 2019-03-15: 237 mL via ORAL

## 2019-03-15 MED ORDER — ALPRAZOLAM 0.5 MG PO TABS
0.5000 mg | ORAL_TABLET | Freq: Two times a day (BID) | ORAL | Status: DC
  Administered 2019-03-15 – 2019-03-17 (×5): 0.5 mg via ORAL
  Filled 2019-03-15 (×6): qty 1

## 2019-03-15 MED ORDER — ONDANSETRON HCL 4 MG/2ML IJ SOLN
4.0000 mg | Freq: Four times a day (QID) | INTRAMUSCULAR | Status: DC | PRN
  Administered 2019-03-16: 4 mg via INTRAVENOUS
  Filled 2019-03-15: qty 2

## 2019-03-15 MED ORDER — BOOST / RESOURCE BREEZE PO LIQD CUSTOM: 1.0000 | Freq: Three times a day (TID) | ORAL | Status: DC

## 2019-03-15 MED ORDER — PANTOPRAZOLE SODIUM 40 MG IV SOLR
40.0000 mg | Freq: Two times a day (BID) | INTRAVENOUS | Status: DC
  Administered 2019-03-15 (×2): 40 mg via INTRAVENOUS
  Filled 2019-03-15 (×2): qty 40

## 2019-03-15 MED ORDER — ADULT MULTIVITAMIN W/MINERALS CH
1.0000 | ORAL_TABLET | Freq: Every day | ORAL | Status: DC
  Administered 2019-03-15 – 2019-03-17 (×3): 1 via ORAL
  Filled 2019-03-15 (×3): qty 1

## 2019-03-15 MED ORDER — ENSURE ENLIVE PO LIQD
237.0000 mL | Freq: Three times a day (TID) | ORAL | Status: DC
  Administered 2019-03-15 – 2019-03-17 (×4): 237 mL via ORAL

## 2019-03-15 MED ORDER — ACETAMINOPHEN 650 MG RE SUPP: 650.0000 mg | Freq: Four times a day (QID) | RECTAL | Status: DC | PRN

## 2019-03-15 MED ORDER — BUPROPION HCL 100 MG PO TABS
100.0000 mg | ORAL_TABLET | Freq: Three times a day (TID) | ORAL | Status: DC
  Administered 2019-03-15 – 2019-03-17 (×8): 100 mg via ORAL
  Filled 2019-03-15 (×9): qty 1

## 2019-03-15 MED ORDER — PANTOPRAZOLE SODIUM 40 MG PO TBEC
40.0000 mg | DELAYED_RELEASE_TABLET | Freq: Two times a day (BID) | ORAL | Status: DC
  Administered 2019-03-15 – 2019-03-17 (×4): 40 mg via ORAL
  Filled 2019-03-15 (×5): qty 1

## 2019-03-15 NOTE — Progress Notes (Signed)
Pt has tolerated full liquid diet today without issue. Carroll Kinds RN

## 2019-03-15 NOTE — Progress Notes (Addendum)
Progress Note    [name]  [id number] DOB: Sep 22, 1955  DOA: 03/25/2019 PCP: [name], NP    Brief Narrative:   Chief complaint: abdominal pain/diarrhea/hypotension  Medical records reviewed and are as summarized below:  [name] is an 64 y.o. male with a past medical history that includes CAD status post CABG, carotid stents, cardiac arrest, recently hospitalized for GI bleed discharged 3 days ago resents to the emergency department with a chief complaint abdominal pain diarrhea after taking MiraLAX for constipation.  Work-up in the emergency department revealed orthostatic hypotension and mildly elevated troponins.  Assessment/Plan:   Principal Problem:   Hypotension Active Problems:   Ischemic cardiomyopathy, EF 45%   Troponin level elevated   Diarrhea   Anemia in chronic illness   PAD (peripheral artery disease) (HCC)   Elevated LFTs   Weakness generalized   #1.  Hypotension/generalized weakness.  Likely related to volume loss the setting of diarrhea and emesis with decreased oral intake.Marland Kitchen  Has a history of hypertension and home medications include Coreg.  Quite orthostatic in the emergency department.  Has been provided IV fluids.  The pressure remains on the low end of normal. -Continue to hold home antihypertensives -Continue gentle IV fluids -Monitor intake and output -Recheck orthostatics in am -PT consult  #2.  Elevated troponin.  Patient with a history of ischemic cardiomyopathy/CAD status post CABG and stenting.  Not currently on aspirin due to recent GI bleed.  Not currently on a statin due to transaminitis/metastatic liver disease. denies chest pain.  Evaluated by cardiology who opine his troponins minimally elevated with no clear trend not likely representing acute coronary syndrome recommend no further ischemia evaluation.  Patient denies chest pain. -Resume aspirin when able  #3.  Recent GI bleed.  Has had gastric issues which  were ablated.  Hemoglobin stable.  Home medications include Protonix.  Denies any nausea or emesis since admission.  Currently on a clear liquid diet and requesting food. chart review indicates plan to resume aspirin today -Monitor CBCs -Advance diet -Resume aspirin tomorrow  #4.  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  Last hospitalization had elevated LFTs. -Patient follow-up  #5.  Diarrhea.  Patient took Gould prior to admission for constipation.  He had multiple watery stools prior to presentation as noted in HPI.  He has reported 2 bowel movements since his admission that were "more formed".  CT of the abdomen pelvis done last week reveals new marked heterogeneity and enlargement of the liver consistent with diffuse metastases, fat-containing left lower quadrant hernia at the site of previous colostomy, small fat-containing epigastric and left inguinal hernias -Monitor   Family Communication/Anticipated D/C date and plan/Code Status   DVT prophylaxis: Lovenox ordered. Code Status: Full Code.  Family Communication: patient Disposition Plan: home tomorrow    Medical Consultants:   [facility]   Anti-Infectives:   None  Subjective:   Lying in bed watching TV.  Reports continued abdominal pain but "no worse than usual".  Denies nausea.  Requesting food  Objective:    Vitals:   03/15/19 0631 03/15/19 0900 03/15/19 0923 03/15/19 1057  BP:   103/61 127/78  Pulse:   83 89  Resp: 14  16 14   Temp:   98 F (36.7 C) 98 F (36.7 C)  TempSrc:   Oral Oral  SpO2:  96%  96%  Weight:      Height:        Intake/Output  Summary (Last 24 hours) at 03/15/2019 1257 Last data filed at 03/15/2019 0500 Gross per 24 hour  Intake 1300 ml  Output --  Net 1300 ml   Filed Weights   03/20/2019 1708 03/15/19 0053  Weight: 67.6 kg 73.3 kg    Exam: General: Awake alert no acute distress CV regular rate and rhythm no murmur gallop or rub no lower  extremity edema Respiratory: No increased work of breathing breath sounds clear bilaterally I hear no wheeze no crackles Abdomen: Nondistended soft positive bowel sounds throughout several well-healed scars from previous procedures.  Nontender to palpation no guarding or rebounding Musculoskeletal: Joints without swelling/erythema is all extremities spontaneously Neuro: Alert and oriented x3 speech is low with a gargled tone facial symmetry cranial nerves II through XII intact   Data Reviewed:   I have personally reviewed following labs and imaging studies:  Labs: Labs show the following:   Basic Metabolic Panel: Recent Labs  Lab 03/10/19 0527 03/10/19 0527 03/11/19 0254 03/11/19 0254 03/12/19 0231 03/12/19 0231 03/24/2019 1254 03/15/19 0212  NA 134*  --  133*  --  133*  --  131* 134*  K 4.5   < > 4.3   < > 3.9   < > 4.4 4.2  CL 96*  --  97*  --  97*  --  95* 101  CO2 25  --  25  --  26  --  24 23  GLUCOSE 75  --  83  --  91  --  105* 95  BUN 11  --  11  --  8  --  10 12  CREATININE 1.01  --  1.09  --  0.92  --  1.14 1.04  CALCIUM 8.3*  --  8.3*  --  8.1*  --  8.3* 8.0*  MG 2.0  --   --   --   --   --   --  2.0  PHOS 2.9  --   --   --   --   --   --   --    < > = values in this interval not displayed.   GFR Estimated Creatinine Clearance: 65.3 mL/min (by C-G formula based on SCr of 1.04 mg/dL). Liver Function Tests: Recent Labs  Lab 03/09/19 1123 03/10/19 0527 03/11/19 0254 04/07/2019 1254  AST 453* 340* 313* 476*  ALT 205* 168* 151* 174*  ALKPHOS 802* 659* 581* 650*  BILITOT 2.0* 1.7* 2.0* 2.1*  PROT 5.8* 5.2* 5.1* 5.3*  ALBUMIN 2.6* 2.4* 2.2* 2.3*   Recent Labs  Lab 04/13/2019 1254  LIPASE 44   No results for input(s): AMMONIA in the last 168 hours. Coagulation profile Recent Labs  Lab 03/09/19 1123  INR 1.2    CBC: Recent Labs  Lab 03/11/19 0254 03/12/19 0231 03/17/2019 1254 03/15/19 0212 03/15/19 0401  WBC 11.7* 11.4* 11.5* 11.3* 11.5*  HGB 9.7*  9.8* 10.7* 10.6* 10.5*  HCT 28.5* 29.2* 31.8* 29.8* 30.4*  MCV 90.8 90.4 91.4 87.9 88.9  PLT 259 269 275 244 258   Cardiac Enzymes: No results for input(s): CKTOTAL, CKMB, CKMBINDEX, TROPONINI in the last 168 hours. BNP (last 3 results) No results for input(s): PROBNP in the last 8760 hours. CBG: No results for input(s): GLUCAP in the last 168 hours. D-Dimer: No results for input(s): DDIMER in the last 72 hours. Hgb A1c: No results for input(s): HGBA1C in the last 72 hours. Lipid Profile: No results for input(s): CHOL, HDL, LDLCALC, TRIG, CHOLHDL,  LDLDIRECT in the last 72 hours. Thyroid function studies: Recent Labs    03/15/19 0212  TSH 11.066*   Anemia work up: No results for input(s): VITAMINB12, FOLATE, FERRITIN, TIBC, IRON, RETICCTPCT in the last 72 hours. Sepsis Labs: Recent Labs  Lab 03/12/19 0231 04/02/2019 1254 03/15/19 0212 03/15/19 0401 03/15/19 0450  WBC 11.4* 11.5* 11.3* 11.5*  --   LATICACIDVEN  --   --  2.7*  --  1.8    Microbiology Recent Results (from the past 240 hour(s))  SARS CORONAVIRUS 2 (TAT 6-24 HRS) Nasopharyngeal Nasopharyngeal Swab     Status: None   Collection Time: 03/09/19  1:33 PM   Specimen: Nasopharyngeal Swab  Result Value Ref Range Status   SARS Coronavirus 2 NEGATIVE NEGATIVE Final    Comment: (NOTE) SARS-CoV-2 target nucleic acids are NOT DETECTED. The SARS-CoV-2 RNA is generally detectable in upper and lower respiratory specimens during the acute phase of infection. Negative results do not preclude SARS-CoV-2 infection, do not rule out co-infections with other pathogens, and should not be used as the sole basis for treatment or other patient management decisions. Negative results must be combined with clinical observations, patient history, and epidemiological information. The expected result is Negative. Fact Sheet for Patients: SugarRoll.be Fact Sheet for Healthcare  Providers: https://www.woods-mathews.com/ This test is not yet approved or cleared by the [place] FDA and  has been authorized for detection and/or diagnosis of SARS-CoV-2 by FDA under an Emergency Use Authorization (EUA). This EUA will remain  in effect (meaning this test can be used) for the duration of the COVID-19 declaration under Section 56 4(b)(1) of the Act, 21 U.S.C. section 360bbb-3(b)(1), unless the authorization is terminated or revoked sooner. Performed at [facility], [place] [address], [place], [place] [address]   SARS CORONAVIRUS 2 (TAT 6-24 HRS) Nasopharyngeal Nasopharyngeal Swab     Status: None   Collection Time: 03/15/19 12:09 AM   Specimen: Nasopharyngeal Swab  Result Value Ref Range Status   SARS Coronavirus 2 NEGATIVE NEGATIVE Final    Comment: (NOTE) SARS-CoV-2 target nucleic acids are NOT DETECTED. The SARS-CoV-2 RNA is generally detectable in upper and lower respiratory specimens during the acute phase of infection. Negative results do not preclude SARS-CoV-2 infection, do not rule out co-infections with other pathogens, and should not be used as the sole basis for treatment or other patient management decisions. Negative results must be combined with clinical observations, patient history, and epidemiological information. The expected result is Negative. Fact Sheet for Patients: SugarRoll.be Fact Sheet for Healthcare Providers: https://www.woods-mathews.com/ This test is not yet approved or cleared by the [place] FDA and  has been authorized for detection and/or diagnosis of SARS-CoV-2 by FDA under an Emergency Use Authorization (EUA). This EUA will remain  in effect (meaning this test can be used) for the duration of the COVID-19 declaration under Section 56 4(b)(1) of the Act, 21 U.S.C. section 360bbb-3(b)(1), unless the authorization is terminated or revoked sooner. Performed at  [facility], [place] [address], [place], [place] [address]     Procedures and diagnostic studies:  CT RENAL STONE STUDY  Result Date: 03/15/2019 CLINICAL DATA:  Abdominal pain EXAM: CT ABDOMEN AND PELVIS WITHOUT CONTRAST TECHNIQUE: Multidetector CT imaging of the abdomen and pelvis was performed following the standard protocol without IV contrast. COMPARISON:  03/09/2019 FINDINGS: Lower chest: Small bilateral pleural effusions are noted with associated atelectatic changes. These are new from the prior exam. Diffuse decreased attenuation is noted within  the cardiac blood pool consistent with anemia. Hepatobiliary: Given the lack of IV contrast the diffuse heterogeneity of the liver is less well appreciated although changes consistent with the known metastatic disease throughout the liver are again identified. Vicarious excretion of contrast is noted within the gallbladder. Pancreas: Unremarkable. No pancreatic ductal dilatation or surrounding inflammatory changes. Spleen: Normal in size without focal abnormality. Adrenals/Urinary Tract: Adrenal glands are within normal limits. Kidneys show no renal calculi or obstructive change. The bladder is well distended. Stomach/Bowel: Postsurgical changes are noted in the sigmoid colon. No obstructive changes are noted. The appendix is within normal limits. No small bowel abnormality is seen. Vascular/Lymphatic: Diffuse vascular calcifications are noted. Changes consistent with right iliac artery stenting are noted. Reproductive: Prostate is unremarkable. Other: Mild free fluid is noted within the pelvis this is increased slightly in the interval prior exam. Fat containing left lower quadrant hernia is noted at the area of previous colostomy. This is stable from the prior exam. Musculoskeletal: Degenerative changes of lumbar spine are seen. IMPRESSION: Stable changes in the liver consistent with known hepatic metastatic disease. New bilateral pleural effusions with  associated atelectatic changes. Changes suggestive of anemia in the cardiac blood pool. Postsurgical changes as described above. Electronically Signed   By: Inez Catalina M.D.   On: 03/15/2019 00:32    Medications:    ALPRAZolam  0.5 mg Oral BID   buPROPion  100 mg Oral TID   feeding supplement  1 Container Oral TID BM   multivitamin with minerals  1 tablet Oral Daily   pantoprazole (PROTONIX) IV  40 mg Intravenous Q12H   sodium chloride flush  3 mL Intravenous Once   Continuous Infusions:  sodium chloride 100 mL/hr at 03/15/19 1239     LOS: 0 days   Radene Gunning NP  [facility]   How to contact the [facility] Attending or Consulting provider [place] or covering provider during after hours [place], for this patient?  Check the care team in [facility] and look for a) attending/consulting [facility] provider listed and b) the [facility] team listed Log into [email or web address] and use [facility] universal password to access. If you do not have the password, please contact the hospital operator. Locate the [facility] provider you are looking for under [facility] and page to a number that you can be directly reached. If you still have difficulty reaching the provider, please page the [facility] (Director on Call) for the Hospitalists listed on amion for assistance.  03/15/2019, 12:57 PM

## 2019-03-15 NOTE — H&P (Signed)
History and Physical    [name] [id number] DOB: [birth year] DOA: 03/17/2019  PCP: [name], NP  Patient coming from: Home.  Chief Complaint: Abdominal pain nausea vomiting and diarrhea.  HPI: [name] is a 64 y.o. male with history of CAD status post CABG, carotid stents, cardiac arrest, recently admitted for GI bleed discharged on March 11, 2018 about 4 days ago since discharge has been having some constipation tried MiraLAX following which patient started having 3-4 episodes diarrhea and has been having poor appetite and unable to eat since patient tries to eat patient has vomiting.  Diarrhea was dark stools.  Given the symptoms patient presents to the ER.  During the last day patient had gastric AVMs which were ablated.  ED Course: In the ER patient's labs show hemoglobin 10.7 elevated LFTs which were elevated during recent admission.  High sensitive troponin was 630 and 557 EKG shows diffuse ST depression which are comparable to the recent EKG done on March 10, 2019.  Patient denies any chest pain or shortness of breath.  Chest x-ray unremarkable Covid test was negative.  Patient admitted for further observation.  Review of Systems: As per HPI, rest all negative.   Past Medical History:  Diagnosis Date  . Acute encephalopathy, improved 06/20/2013  . Acute MI, troponin > 20, no obvious culprit vessel 06/20/2013  . Anxiety   . Arthritis   . At risk for sudden cardiac death, has lifevest at discharge 06/20/2013  . Carotid stenosis   . Coronary artery disease    s/p CABG 2006; stenting to vein graft 05-2013; NSTEMI 06-2013 in setting of PEA arrest  . Depression   . Difficulty swallowing solids    Pt unable to swallow solid foods  . Dizziness 06/20/2013  . Dyslipidemia 10/09/2013   [place] Study atorvastatin -eIRB # [id number] tablet Take 40 mg by mouth daily.   . Fever, possible aspiration-treated with antibiotics 06/20/2013  . GERD (gastroesophageal reflux disease)    . Heart failure, acute on chronic, systolic and diastolic ([place]) [id number]  . Heart murmur   . History of radiation therapy 09/05/14-10/23/14   larynx 70 Gy  . Hyperlipidemia   . Hypertension   . Hypothermia, induced, post arrest 06/20/2013  . Incisional hernia   . Ischemic cardiomyopathy    echo 06/2013 EF 10-15% (normal 01/2013)  . PAD (peripheral artery disease) ([place]) 10/09/2013   Occluded left common carotid and moderate right internal carotid artery stenosis. Previous stent to the left subclavian artery. Right iliac stent. Bilateral 60-99% renal artery stenosis    . Radiation-induced esophageal stricture   . Shock liver 06/20/2013  . Shortness of breath dyspnea    with exertion  . 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 Subclavian artery stenosis, left ([facility])    s/p stenting 2006  . Tobacco abuse    >100 pack year history   . Weakness due to cardiac arrest 06/20/2013  . Wears glasses     Past Surgical History:  Procedure Laterality Date  . CARDIAC CATHETERIZATION    . CAROTID ANGIOGRAPHY  07/10/2016   Procedure: Carotid Angiography;  Surgeon: Lorretta Harp, MD;  Location: [facility];  Service: Cardiovascular;;  Rt. Carotid  . CAROTID PTA/STENT INTERVENTION N/A 07/10/2016   Procedure: Carotid PTA/Stent Intervention;  Surgeon: Lorretta Harp, MD;  Location: [place] [facility];  Service: Cardiovascular;  Laterality: N/A;  Rt. External Carotid  . COLON SURGERY    . COLONOSCOPY    .  COLOSTOMY    . COLOSTOMY TAKEDOWN  06/13/2015  . COLOSTOMY TAKEDOWN N/A 06/13/2015   Procedure: COLOSTOMY TAKEDOWN;  Surgeon: Coralie Keens, MD;  Location: [facility];  Service: General;  Laterality: N/A;  . CORONARY ARTERY BYPASS GRAFT  09/2004   SVG to LAD (at [facility])  . DIRECT LARYNGOSCOPY N/A 07/06/2014   Procedure: DIRECT LARYNGOSCOPY AND ESOPAGOSCOPY WITH BIOPSY;  Surgeon: [name], MD;  Location: [facility] OR;  Service: ENT;  Laterality: N/A;  . ESOPHAGOGASTRODUODENOSCOPY (EGD) WITH  PROPOFOL N/A 03/10/2019   Procedure: ESOPHAGOGASTRODUODENOSCOPY (EGD) WITH PROPOFOL;  Surgeon: Carol Ada, MD;  Location: [place] [facility];  Service: Endoscopy;  Laterality: N/A;  . ESOPHAGOSCOPY WITH DILITATION N/A 10/31/2015   Procedure: ESOPHAGOSCOPY WITH DILITATION;  Surgeon: [name], MD;  Location: [facility];  Service: ENT;  Laterality: N/A;  . HOT HEMOSTASIS N/A 03/10/2019   Procedure: HOT HEMOSTASIS (ARGON PLASMA COAGULATION/BICAP);  Surgeon: Carol Ada, MD;  Location: [facility] [facility];  Service: Endoscopy;  Laterality: N/A;  . ILIAC ARTERY STENT  07/24/2008   8x6 Smart nitinol self-expanding stent to origin of ilaci down into external iliac crossing the hypogastric artery (Dr. Adora Fridge)  . [place] REPAIR  06/13/2015  . LAPAROTOMY N/A 08/05/2014   Procedure: EXPLORATORY LAPAROTOMY WITH SIGMOIDOTOMY WITH COLECTOMY & COLOSTOMY;  Surgeon: Coralie Keens, MD;  Location: [place];  Service: General;  Laterality: N/A;  . LEFT HEART CATHETERIZATION WITH CORONARY/GRAFT ANGIOGRAM N/A 06/15/2013   Procedure: LEFT HEART CATHETERIZATION WITH Beatrix Fetters;  Surgeon: Sinclair Grooms, MD;  Location: [facility];  Service: Cardiovascular;  Laterality: N/A;  . LOWER EXTREMITY ARTERIAL DOPPLER  08/2008   right and left ABI - mild arterial insufficiency at rest; R CIA/stent with 50-69% narrowing, L CIA with increased velocities (50-69% diameter reduction)  . NM MYOCAR PERF WALL MOTION  06/2008   persantine myoview - normal pattern of systolic thickening/wall motion/perfusion;  . PEG TUBE REMOVAL    . PERIPHERAL VASCULAR CATHETERIZATION  05/2013   stent to SVG - LAD vein graft at [facility]  . PERIPHERAL VASCULAR CATHETERIZATION N/A 05/08/2015   Procedure: Aortic Arch Angiography;  Surgeon: Serafina Mitchell, MD;  Location: [place] [facility];  Service: Cardiovascular;  Laterality: N/A;  . PERIPHERAL VASCULAR CATHETERIZATION N/A 05/08/2015   Procedure: Carotid Angiography;  Surgeon: Serafina Mitchell, MD;   Location: [place] [facility];  Service: Cardiovascular;  Laterality: N/A;  . PERIPHERAL VASCULAR CATHETERIZATION  06/13/2015   Procedure: PORTA CATH REMOVAL;  Surgeon: Coralie Keens, MD;  Location: [place];  Service: General;;  . PORT-A-CATH REMOVAL  06/13/2015  . RENAL ARTERY DOPPLER  05/2008   right and left prox renal arteries with narrowing and increased velocities (60-99% diameter reduction)  . SUBCLAVIAN ANGIOGRAM Left 08/20/2004   8x39mm Genesis balloon mounted stent     reports that he quit smoking about 5 years ago. His smoking use included cigarettes. He has a 180.00 pack-year smoking history. He has quit using smokeless tobacco. He reports current alcohol use of about 21.0 standard drinks of alcohol per week. He reports that he does not use drugs.  Allergies  Allergen Reactions  . Cetuximab Anaphylaxis    Cardiac arrest 08/02/2014    Family History  Problem Relation Age of Onset  . Heart disease Maternal Aunt   . Heart attack Mother   . Stroke Mother   . Cancer Father   . Stroke Sister   . Heart Problems Sister     Prior to Admission medications  Medication Sig Start Date End Date Taking? Authorizing Provider  ALPRAZolam Duanne Moron) 0.5 MG tablet Take 0.5 mg by mouth 2 (two) times daily.     [provider]  aspirin 81 MG EC tablet Take 1 tablet (81 mg total) by mouth daily. Resume on 03/15/2019 03/15/19   Elgergawy, Silver Huguenin, MD  buPROPion (WELLBUTRIN) 100 MG tablet Take 100 mg by mouth 3 (three) times daily.     [provider]  carvedilol (COREG) 6.25 MG tablet TAKE 1 TABLET(6.25 MG) BY MOUTH TWICE DAILY WITH A MEAL Patient taking differently: Take 6.25 mg by mouth 2 (two) times daily with a meal.  07/02/16   Croitoru, Mihai, MD  GENERLAC 10 GM/15ML SOLN Take 10 g by mouth daily. 02/19/19   [provider]  pantoprazole (PROTONIX) 40 MG tablet Take 1 tablet (40 mg total) by mouth daily. 03/12/19 04/11/19  Elgergawy, Silver Huguenin, MD  Vitamins/Minerals TABS  Take 1 tablet by mouth daily.    [provider]    Physical Exam: Constitutional: Moderately built and nourished. Vitals:   03/26/2019 2100 03/15/2019 2200 03/15/19 0007 03/15/19 0053  BP: 117/71 124/76 120/69 104/65  Pulse: 89 82 83 83  Resp: 20 18 18 16   Temp:    97.7 F (36.5 C)  TempSrc:    Oral  SpO2: 95% 95% 95% 99%  Weight:    73.3 kg  Height:    5\' 7"  (1.702 m)   Eyes: Anicteric no pallor. ENMT: No discharge from the ears eyes nose or mouth. Neck: No mass felt.  No neck rigidity. Respiratory: No rhonchi or crepitations. Cardiovascular: S1-S2 heard. Abdomen: Soft nontender bowel sounds present. Musculoskeletal: No edema. Skin: No rash. Neurologic: Alert awake oriented to time place and person.  Moves all extremities. Psychiatric: Appears normal per normal affect.   Labs on Admission: I have personally reviewed following labs and imaging studies  CBC: Recent Labs  Lab 03/10/19 0821 03/10/19 1839 03/11/19 0254 03/12/19 0231 03/28/2019 1254  WBC 13.1* 12.4* 11.7* 11.4* 11.5*  HGB 10.1* 10.3* 9.7* 9.8* 10.7*  HCT 30.7* 30.1* 28.5* 29.2* 31.8*  MCV 92.5 91.5 90.8 90.4 91.4  PLT 294 288 259 269 [id number]   Basic Metabolic Panel: Recent Labs  Lab 03/09/19 1123 03/10/19 0527 03/11/19 0254 03/12/19 0231 04/13/2019 1254  NA 131* 134* 133* 133* 131*  K 4.8 4.5 4.3 3.9 4.4  CL 91* 96* 97* 97* 95*  CO2 24 25 25 26 24   GLUCOSE 97 75 83 91 105*  BUN 11 11 11 8 10   CREATININE 1.17 1.01 1.09 0.92 1.14  CALCIUM 8.6* 8.3* 8.3* 8.1* 8.3*  MG  --  2.0  --   --   --   PHOS  --  2.9  --   --   --    GFR: Estimated Creatinine Clearance: 59.6 mL/min (by C-G formula based on SCr of 1.14 mg/dL). Liver Function Tests: Recent Labs  Lab 03/09/19 1123 03/10/19 0527 03/11/19 0254 04/07/2019 1254  AST 453* 340* 313* 476*  ALT 205* 168* 151* 174*  ALKPHOS 802* 659* 581* 650*  BILITOT 2.0* 1.7* 2.0* 2.1*  PROT 5.8* 5.2* 5.1* 5.3*  ALBUMIN 2.6* 2.4* 2.2* 2.3*   Recent  Labs  Lab 03/21/2019 1254  LIPASE 44   No results for input(s): AMMONIA in the last 168 hours. Coagulation Profile: Recent Labs  Lab 03/09/19 1123  INR 1.2   Cardiac Enzymes: No results for input(s): CKTOTAL, CKMB, CKMBINDEX, TROPONINI in the last 168  hours. BNP (last 3 results) No results for input(s): PROBNP in the last 8760 hours. HbA1C: No results for input(s): HGBA1C in the last 72 hours. CBG: No results for input(s): GLUCAP in the last 168 hours. Lipid Profile: No results for input(s): CHOL, HDL, LDLCALC, TRIG, CHOLHDL, LDLDIRECT in the last 72 hours. Thyroid Function Tests: No results for input(s): TSH, T4TOTAL, FREET4, T3FREE, THYROIDAB in the last 72 hours. Anemia Panel: No results for input(s): VITAMINB12, FOLATE, FERRITIN, TIBC, IRON, RETICCTPCT in the last 72 hours. Urine analysis:    Component Value Date/Time   COLORURINE AMBER (A) 04/07/2019 1944   APPEARANCEUR HAZY (A) 04/05/2019 1944   LABSPEC 1.024 03/21/2019 1944   PHURINE 6.0 04/12/2019 1944   GLUCOSEU NEGATIVE 04/11/2019 1944   HGBUR NEGATIVE 03/17/2019 1944   BILIRUBINUR NEGATIVE 04/09/2019 1944   KETONESUR 20 (A) 04/09/2019 1944   PROTEINUR 30 (A) 03/25/2019 1944   UROBILINOGEN 0.2 08/05/2014 0511   NITRITE NEGATIVE 03/29/2019 1944   LEUKOCYTESUR NEGATIVE 04/10/2019 1944   Sepsis Labs: @LABRCNTIP (procalcitonin:4,lacticidven:4) ) Recent Results (from the past 240 hour(s))  SARS CORONAVIRUS 2 (TAT 6-24 HRS) Nasopharyngeal Nasopharyngeal Swab     Status: None   Collection Time: 03/09/19  1:33 PM   Specimen: Nasopharyngeal Swab  Result Value Ref Range Status   SARS Coronavirus 2 NEGATIVE NEGATIVE Final    Comment: (NOTE) SARS-CoV-2 target nucleic acids are NOT DETECTED. The SARS-CoV-2 RNA is generally detectable in upper and lower respiratory specimens during the acute phase of infection. Negative results do not preclude SARS-CoV-2 infection, do not rule out co-infections with other pathogens,  and should not be used as the sole basis for treatment or other patient management decisions. Negative results must be combined with clinical observations, patient history, and epidemiological information. The expected result is Negative. Fact Sheet for Patients: SugarRoll.be Fact Sheet for Healthcare Providers: https://www.woods-mathews.com/ This test is not yet approved or cleared by the [place] FDA and  has been authorized for detection and/or diagnosis of SARS-CoV-2 by FDA under an Emergency Use Authorization (EUA). This EUA will remain  in effect (meaning this test can be used) for the duration of the COVID-19 declaration under Section 56 4(b)(1) of the Act, 21 U.S.C. section 360bbb-3(b)(1), unless the authorization is terminated or revoked sooner. Performed at [facility], [place] [address], [place], [place] [address]      Radiological Exams on Admission: CT RENAL STONE STUDY  Result Date: 03/15/2019 CLINICAL DATA:  Abdominal pain EXAM: CT ABDOMEN AND PELVIS WITHOUT CONTRAST TECHNIQUE: Multidetector CT imaging of the abdomen and pelvis was performed following the standard protocol without IV contrast. COMPARISON:  03/09/2019 FINDINGS: Lower chest: Small bilateral pleural effusions are noted with associated atelectatic changes. These are new from the prior exam. Diffuse decreased attenuation is noted within the cardiac blood pool consistent with anemia. Hepatobiliary: Given the lack of IV contrast the diffuse heterogeneity of the liver is less well appreciated although changes consistent with the known metastatic disease throughout the liver are again identified. Vicarious excretion of contrast is noted within the gallbladder. Pancreas: Unremarkable. No pancreatic ductal dilatation or surrounding inflammatory changes. Spleen: Normal in size without focal abnormality. Adrenals/Urinary Tract: Adrenal glands are within normal limits. Kidneys  show no renal calculi or obstructive change. The bladder is well distended. Stomach/Bowel: Postsurgical changes are noted in the sigmoid colon. No obstructive changes are noted. The appendix is within normal limits. No small bowel abnormality is seen. Vascular/Lymphatic: Diffuse vascular calcifications are noted. Changes consistent with  right iliac artery stenting are noted. Reproductive: Prostate is unremarkable. Other: Mild free fluid is noted within the pelvis this is increased slightly in the interval prior exam. Fat containing left lower quadrant hernia is noted at the area of previous colostomy. This is stable from the prior exam. Musculoskeletal: Degenerative changes of lumbar spine are seen. IMPRESSION: Stable changes in the liver consistent with known hepatic metastatic disease. New bilateral pleural effusions with associated atelectatic changes. Changes suggestive of anemia in the cardiac blood pool. Postsurgical changes as described above. Electronically Signed   By: Inez Catalina M.D.   On: 03/15/2019 00:32    EKG: Independently reviewed.  Normal sinus rhythm.  Assessment/Plan Principal Problem:   Diarrhea Active Problems:   Anemia in chronic illness   Ischemic cardiomyopathy, EF 45%   PAD (peripheral artery disease) (HCC)   Elevated LFTs   Elevated troponin   Hypotension   Weakness generalized    1. Diarrhea vomiting abdominal discomfort cause not clear.  Will order a CT abdomen pelvis.  Closely observe.  We will keep patient on clear liquid diet for now.  Continue Protonix.  May need stool studies if continues to have diarrhea. 2. Elevated troponin with history of CAD will consult cardiology.  Since there is concern for possible ongoing GI bleed I am holding of all oral medications for now. 3. Recent GI bleed has had gastric issues which were ablated.  Will check serial CBCs.  On Protonix. 4. 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  Will need follow-up with  oncologist. 5. Elevated LFTs likely from metastatic disease. 6. Anemia follow CBC. 7. History of carotid stenosis. 8. History of alcoholism closely monitor for any withdrawal   DVT prophylaxis: SCDs. Code Status: Full code. Family Communication: Discussed with patient. Disposition Plan: Home. Consults called: Cardiology. Admission status: Observation.   Rise Patience MD [facility] Pager [phone].  If 7PM-7AM, please contact night-coverage [email or web address] Password [email or web address]  03/15/2019, 1:46 AM

## 2019-03-15 NOTE — Consult Note (Addendum)
Cardiology Consultation:   Patient ID: TEMUR SCHU MRN: [id number]; DOB: 1955-04-04  Admit date: 04/06/2019 Date of Consult: 03/15/2019  Primary Care Provider: [name], NP Primary Cardiologist: Sanda Klein, MD   Patient Profile:   [name] is a 64 y.o. male with a hx of CAD s/p CABG and multiple PCI afterwards, cardiac arrest, ischemic cardiomyopathy, hypertension, hyperlipidemia, history of 100-pack-year tobacco smoking, peripheral arterial disease, carotid artery disease and throat cancer status post radiation who is being seen today for the evaluation of elevated troponin at the request of Dr. Eliseo Squires.  CAD and is S/P CABG in '06 x 1. He apparently had a subsequent CFX stent in Jan 2015 at [facility]. In May 2015 he went to his Primary Care Provider for shoulder pain and his Troponin was elevated. He was sent to the ER at [facility]. He was admitted and had a cath and subsequent SVG-LAD DES was placed on 06/02/13. He was discharged on Plavix. On 06/09/13 he was found down at home by his son. CPR was started and EMS called. He was noted to be in VF. He was transported to [facility]. He had shock, respiratory failure, shock liver, and acute renal insufficiency. He underwent cath (Last) 6/3//15 by Dr Tamala Julian which revealed widely patent saphenous vein graft to the mid LAD with no evidence of ISR. He also had a widely patent proximal to mid circumflex stent.  There was mild to moderate distal left main stenosis in the 30-40% range with 80-90% ostial LAD threatening a small to moderate diagonal territory in the distribution of the wall motion abnormality on LV gram. The mid LAD off the retrograde segment from the graft insertion site was also occluded. He had a widely patent RCA. A Myoview showed no ischemia. His P2Y12 was elevated and he was taken off Plavix and put on Brilinita. He was discharged with a Life Vest.   Last echocardiogram 07/2014 showed LV function of 50 to 55% and hypokinesis of mid  apical anterior septal myocardium.  Last seen by Dr. Sallyanne Kuster 01/2017.  Hx of peripheral artery disease status post stent of the left subclavian artery in 2006, occluded left common carotid artery, stent of the right internal carotid artery, history of right iliac artery stent, bilateral moderate to severe renal artery stenosis. This is followed by Dr. Trula Slade.   Most recently patient was admitted 2/24-2/27/2021 for upper GI bleed following coffee-ground emesis.  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 Held ASA and advised to resume on 03/15/19.  Held plavix. There was a concern for metastatic liver disease and advised to follow-up with oncology.  History of Present Illness:   Mr. [name] presented with multiple episode of diarrhea and fluctuating blood pressure.  After most recent discharge, patient had constipation and tried MiraLAX followed by multiple diarrhea.  Has poor appetite.  Diarrhea was dark but seems to be getting better.  He reports feeling dizzy and dropping blood pressure while standing.  Independent at home.  He denies chest pain, shortness of breath, orthopnea, PND or lower extremity edema.  Feels tight abdomen.  Upon arrival blood pressure was 103/65. Got hydration overnight.  TSH 11.066 Lactic acid 2.7>>1.8 Hs-troponin 638>>557>>642>>557 Scr 1.04 K 4.2 CT Renal: liver consistent with known hepatic metastatic disease.    Heart Pathway Score:     Past Medical History:  Diagnosis Date  . Acute encephalopathy, improved 06/20/2013  . Acute MI, troponin > 20, no obvious culprit vessel 06/20/2013  . Anxiety   . Arthritis   .  At risk for sudden cardiac death, has lifevest at discharge 06/20/2013  . Carotid stenosis   . Coronary artery disease    s/p CABG 2006; stenting to vein graft 05-2013; NSTEMI 06-2013 in setting of PEA arrest  . Depression   . Difficulty swallowing solids    Pt unable to swallow solid foods  . Dizziness 06/20/2013  . Dyslipidemia 10/09/2013   [place]  Study atorvastatin -eIRB # [id number] tablet Take 40 mg by mouth daily.   . Fever, possible aspiration-treated with antibiotics 06/20/2013  . GERD (gastroesophageal reflux disease)   . Heart failure, acute on chronic, systolic and diastolic ([place]) [id number]  . Heart murmur   . History of radiation therapy 09/05/14-10/23/14   larynx 70 Gy  . Hyperlipidemia   . Hypertension   . Hypothermia, induced, post arrest 06/20/2013  . Incisional hernia   . Ischemic cardiomyopathy    echo 06/2013 EF 10-15% (normal 01/2013)  . PAD (peripheral artery disease) ([place]) 10/09/2013   Occluded left common carotid and moderate right internal carotid artery stenosis. Previous stent to the left subclavian artery. Right iliac stent. Bilateral 60-99% renal artery stenosis    . Radiation-induced esophageal stricture   . Shock liver 06/20/2013  . Shortness of breath dyspnea    with exertion  . 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 Subclavian artery stenosis, left ([facility])    s/p stenting 2006  . Tobacco abuse    >100 pack year history   . Weakness due to cardiac arrest 06/20/2013  . Wears glasses     Past Surgical History:  Procedure Laterality Date  . CARDIAC CATHETERIZATION    . CAROTID ANGIOGRAPHY  07/10/2016   Procedure: Carotid Angiography;  Surgeon: Lorretta Harp, MD;  Location: [place] CV LAB;  Service: Cardiovascular;;  Rt. Carotid  . CAROTID PTA/STENT INTERVENTION N/A 07/10/2016   Procedure: Carotid PTA/Stent Intervention;  Surgeon: Lorretta Harp, MD;  Location: [place] CV LAB;  Service: Cardiovascular;  Laterality: N/A;  Rt. External Carotid  . COLON SURGERY    . COLONOSCOPY    . COLOSTOMY    . COLOSTOMY TAKEDOWN  06/13/2015  . COLOSTOMY TAKEDOWN N/A 06/13/2015   Procedure: COLOSTOMY TAKEDOWN;  Surgeon: Coralie Keens, MD;  Location: [place];  Service: General;  Laterality: N/A;  . CORONARY ARTERY BYPASS GRAFT  09/2004   SVG to LAD (at [facility])  . DIRECT LARYNGOSCOPY N/A 07/06/2014    Procedure: DIRECT LARYNGOSCOPY AND ESOPAGOSCOPY WITH BIOPSY;  Surgeon: [name], MD;  Location: [facility] OR;  Service: ENT;  Laterality: N/A;  . ESOPHAGOGASTRODUODENOSCOPY (EGD) WITH PROPOFOL N/A 03/10/2019   Procedure: ESOPHAGOGASTRODUODENOSCOPY (EGD) WITH PROPOFOL;  Surgeon: Carol Ada, MD;  Location: [place];  Service: Endoscopy;  Laterality: N/A;  . ESOPHAGOSCOPY WITH DILITATION N/A 10/31/2015   Procedure: ESOPHAGOSCOPY WITH DILITATION;  Surgeon: [name], MD;  Location: [place];  Service: ENT;  Laterality: N/A;  . HOT HEMOSTASIS N/A 03/10/2019   Procedure: HOT HEMOSTASIS (ARGON PLASMA COAGULATION/BICAP);  Surgeon: Carol Ada, MD;  Location: [place];  Service: Endoscopy;  Laterality: N/A;  . ILIAC ARTERY STENT  07/24/2008   8x6 Smart nitinol self-expanding stent to origin of ilaci down into external iliac crossing the hypogastric artery (Dr. Adora Fridge)  . [place] REPAIR  06/13/2015  . LAPAROTOMY N/A 08/05/2014   Procedure: EXPLORATORY LAPAROTOMY WITH SIGMOIDOTOMY WITH COLECTOMY & COLOSTOMY;  Surgeon: Coralie Keens, MD;  Location: [place];  Service: General;  Laterality: N/A;  . LEFT HEART CATHETERIZATION WITH  CORONARY/GRAFT ANGIOGRAM N/A 06/15/2013   Procedure: LEFT HEART CATHETERIZATION WITH Beatrix Fetters;  Surgeon: Sinclair Grooms, MD;  Location: [facility] CATH LAB;  Service: Cardiovascular;  Laterality: N/A;  . LOWER EXTREMITY ARTERIAL DOPPLER  08/2008   right and left ABI - mild arterial insufficiency at rest; R CIA/stent with 50-69% narrowing, L CIA with increased velocities (50-69% diameter reduction)  . NM MYOCAR PERF WALL MOTION  06/2008   persantine myoview - normal pattern of systolic thickening/wall motion/perfusion;  . PEG TUBE REMOVAL    . PERIPHERAL VASCULAR CATHETERIZATION  05/2013   stent to SVG - LAD vein graft at [facility]  . PERIPHERAL VASCULAR CATHETERIZATION N/A 05/08/2015   Procedure: Aortic Arch Angiography;  Surgeon: Serafina Mitchell, MD;  Location: [place] CV LAB;  Service: Cardiovascular;  Laterality: N/A;  . PERIPHERAL VASCULAR CATHETERIZATION N/A 05/08/2015   Procedure: Carotid Angiography;  Surgeon: Serafina Mitchell, MD;  Location: [place] CV LAB;  Service: Cardiovascular;  Laterality: N/A;  . PERIPHERAL VASCULAR CATHETERIZATION  06/13/2015   Procedure: PORTA CATH REMOVAL;  Surgeon: Coralie Keens, MD;  Location: [place];  Service: General;;  . PORT-A-CATH REMOVAL  06/13/2015  . RENAL ARTERY DOPPLER  05/2008   right and left prox renal arteries with narrowing and increased velocities (60-99% diameter reduction)  . SUBCLAVIAN ANGIOGRAM Left 08/20/2004   8x108mm Genesis balloon mounted stent     Inpatient Medications: Scheduled Meds: . feeding supplement (ENSURE ENLIVE)  237 mL Oral BID BM  . pantoprazole (PROTONIX) IV  40 mg Intravenous Q12H  . sodium chloride flush  3 mL Intravenous Once   Continuous Infusions: . sodium chloride 100 mL/hr at 03/15/19 0227   PRN Meds: acetaminophen **OR** acetaminophen, ondansetron **OR** ondansetron (ZOFRAN) IV  Allergies:    Allergies  Allergen Reactions  . Cetuximab Anaphylaxis    Cardiac arrest 08/02/2014    Social History:   Social History   Socioeconomic History  . Marital status: Divorced    Spouse name: Not on file  . Number of children: 9  . Years of education: 10  . Highest education level: Not on file  Occupational History  . Occupation: Games developer  Tobacco Use  . Smoking status: Former Smoker    Packs/day: 4.00    Years: 45.00    Pack years: 180.00    Types: Cigarettes    Quit date: 06/09/2013    Years since quitting: 5.7  . Smokeless tobacco: Former Network engineer and Sexual Activity  . Alcohol use: Yes    Alcohol/week: 21.0 standard drinks    Types: 21 Cans of beer per week  . Drug use: No  . Sexual activity: Never  Other Topics Concern  . Not on file  Social History Narrative  . Not on file   Social Determinants of Health   Financial Resource Strain:    . Difficulty of Paying Living Expenses: Not on file  Food Insecurity:   . Worried About Charity fundraiser in the Last Year: Not on file  . Ran Out of Food in the Last Year: Not on file  Transportation Needs:   . Lack of Transportation (Medical): Not on file  . Lack of Transportation (Non-Medical): Not on file  Physical Activity:   . Days of Exercise per Week: Not on file  . Minutes of Exercise per Session: Not on file  Stress:   . Feeling of Stress : Not on file  Social Connections:   . Frequency of Communication with  Friends and Family: Not on file  . Frequency of Social Gatherings with Friends and Family: Not on file  . Attends Religious Services: Not on file  . Active Member of Clubs or Organizations: Not on file  . Attends Archivist Meetings: Not on file  . Marital Status: Not on file  Intimate Partner Violence:   . Fear of Current or Ex-Partner: Not on file  . Emotionally Abused: Not on file  . Physically Abused: Not on file  . Sexually Abused: Not on file    Family History:   Family History  Problem Relation Age of Onset  . Heart disease Maternal Aunt   . Heart attack Mother   . Stroke Mother   . Cancer Father   . Stroke Sister   . Heart Problems Sister      ROS:  Please see the history of present illness.  All other ROS reviewed and negative.     Physical Exam/Data:   Vitals:   03/15/19 0053 03/15/19 0359 03/15/19 0500 03/15/19 0631  BP: 104/65 112/68 116/65   Pulse: 83 83 86   Resp: 16 16 (!) 51 14  Temp: 97.7 F (36.5 C) 97.7 F (36.5 C)    TempSrc: Oral Oral    SpO2: 99% 96% 94%   Weight: 73.3 kg     Height: 5\' 7"  (1.702 m)       Intake/Output Summary (Last 24 hours) at 03/15/2019 0911 Last data filed at 03/15/2019 0500 Gross per 24 hour  Intake 1300 ml  Output --  Net 1300 ml   Last 3 Weights 03/15/2019 04/03/2019 03/09/2019  Weight (lbs) 161 lb 9.6 oz 149 lb 0.5 oz 149 lb 0.5 oz  Weight (kg) 73.3 kg 67.6 kg 67.6 kg     Body mass  index is 25.31 kg/m.  General:  Well nourished, well developed, in no acute distress HEENT: normal, hoarseness  Lymph: no adenopathy Neck: no JVD Endocrine:  No thryomegaly Vascular: No carotid bruits; FA pulses 2+ bilaterally without bruits  Cardiac:  normal S1, S2; RRR; no murmur  Lungs:  clear to auscultation bilaterally, no wheezing, rhonchi or rales  Abd: soft, + tender, + distended no hepatomegaly  Ext: no edema Musculoskeletal:  No deformities, BUE and BLE strength normal and equal Skin: warm and dry  Neuro:  CNs 2-12 intact, no focal abnormalities noted Psych:  Normal affect   EKG:  The EKG was personally reviewed and demonstrates: Normal sinus rhythm with minimal ST depression in inferior which is somewhat similar to prior EKG of 03/10/2019 Telemetry:  Telemetry was personally reviewed and demonstrates:  SR at rate of 80s Relevant CV Studies:  Cardiac catheterization 06/2013 ANGIOGRAPHIC DATA:   The left main coronary artery is heavily calcified, with somewhat hazy appearance, and 30-40% mid to distal narrowing..  The left anterior descending artery is totally occluded proximally. It is heavily calcified. The ostium of the LAD contains 70-80% narrowing with a threatened territory being a small to moderate size diagonal..  The left circumflex artery is is patent. A relatively long stent in the proximal circumflex is widely patent. The ostial circumflex and proximal segment contained no fixed obstruction and a hazy appearance.  The right coronary artery is heavily calcified in the ostium. The vessel is otherwise smooth and widely patent. No obstruction is noted.Marland Kitchen  BYPASS GRAFT ANGIOGRAPHY: The saphenous vein graft to the LAD is widely patent as is the stent in the mid body of the graft. The LAD fills  both anteriorly and retrogradely from the anastomosis with total occlusion of the LAD in the mid segment.  LEFT VENTRICULOGRAM:  Left ventricular angiogram was done in the 30  RAO projection and revealed basal to mid anterior wall moderate hypokinesis. EF 40%.   IMPRESSIONS:  1. Widely patent saphenous vein graft to the mid LAD. This vessel is stented and there is no evidence of ISR. 2. Widely patent proximal to mid circumflex stent. There is haziness in the circumflex proximal to the stent but no significant obstruction. 3. Mild to moderate distal left main stenosis in the 30-40% range. 4. 80-90% ostial LAD threatening a small to moderate diagonal territory in the distribution of the wall motion abnormality. The mid LAD off the retrograde segment from the graft insertion site is also occluded. 5. Widely patent RCA  RECOMMENDATION:  1. The patient is a fixed basal to mid anterior wall motion abnormality. This is in the distribution of proximal LAD and diagonal. This raises the question of whether or not the ostial LAD/diagonal could be a source of ischemia that cause arrhythmia. Intervention in this area would be difficult due to heavy calcification and will involve the left main which was then threatened the circumflex. If recurrent angina, the only area that would explain this symptom at this time would be the ostial LAD diagonal territory. At some point consideration of a myocardial perfusion study to look for ischemia/problem myocardium in that region would be reasonable.  2. Despite the presentation with cardiac arrest in the setting of myocardial infarction, consideration of AICD implantation should be addressed. Discussed with Dr. Sallyanne Kuster.   Laboratory Data:  High Sensitivity Troponin:   Recent Labs  Lab 03/28/2019 1909 04/13/2019 2028 03/15/19 0212 03/15/19 0401  TROPONINIHS 638* 557* 642* 557*     Chemistry Recent Labs  Lab 03/12/19 0231 03/20/2019 1254 03/15/19 0212  NA 133* 131* 134*  K 3.9 4.4 4.2  CL 97* 95* 101  CO2 26 24 23   GLUCOSE 91 105* 95  BUN 8 10 12   CREATININE 0.92 1.14 1.04  CALCIUM 8.1* 8.3* 8.0*  GFRNONAA >60 >60 >60  GFRAA  >60 >60 >60  ANIONGAP 10 12 10     Recent Labs  Lab 03/10/19 0527 03/11/19 0254 03/20/2019 1254  PROT 5.2* 5.1* 5.3*  ALBUMIN 2.4* 2.2* 2.3*  AST 340* 313* 476*  ALT 168* 151* 174*  ALKPHOS 659* 581* 650*  BILITOT 1.7* 2.0* 2.1*   Hematology Recent Labs  Lab 03/27/2019 1254 03/15/19 0212 03/15/19 0401  WBC 11.5* 11.3* 11.5*  RBC 3.48* 3.39* 3.42*  HGB 10.7* 10.6* 10.5*  HCT 31.8* 29.8* 30.4*  MCV 91.4 87.9 88.9  MCH 30.7 31.3 30.7  MCHC 33.6 35.6 34.5  RDW 18.3* 17.6* 17.9*  PLT 275 244 258   Radiology/Studies:  CT RENAL STONE STUDY  Result Date: 03/15/2019 CLINICAL DATA:  Abdominal pain EXAM: CT ABDOMEN AND PELVIS WITHOUT CONTRAST TECHNIQUE: Multidetector CT imaging of the abdomen and pelvis was performed following the standard protocol without IV contrast. COMPARISON:  03/09/2019 FINDINGS: Lower chest: Small bilateral pleural effusions are noted with associated atelectatic changes. These are new from the prior exam. Diffuse decreased attenuation is noted within the cardiac blood pool consistent with anemia. Hepatobiliary: Given the lack of IV contrast the diffuse heterogeneity of the liver is less well appreciated although changes consistent with the known metastatic disease throughout the liver are again identified. Vicarious excretion of contrast is noted within the gallbladder. Pancreas: Unremarkable. No pancreatic ductal dilatation or  surrounding inflammatory changes. Spleen: Normal in size without focal abnormality. Adrenals/Urinary Tract: Adrenal glands are within normal limits. Kidneys show no renal calculi or obstructive change. The bladder is well distended. Stomach/Bowel: Postsurgical changes are noted in the sigmoid colon. No obstructive changes are noted. The appendix is within normal limits. No small bowel abnormality is seen. Vascular/Lymphatic: Diffuse vascular calcifications are noted. Changes consistent with right iliac artery stenting are noted. Reproductive: Prostate  is unremarkable. Other: Mild free fluid is noted within the pelvis this is increased slightly in the interval prior exam. Fat containing left lower quadrant hernia is noted at the area of previous colostomy. This is stable from the prior exam. Musculoskeletal: Degenerative changes of lumbar spine are seen. IMPRESSION: Stable changes in the liver consistent with known hepatic metastatic disease. New bilateral pleural effusions with associated atelectatic changes. Changes suggestive of anemia in the cardiac blood pool. Postsurgical changes as described above. Electronically Signed   By: Inez Catalina M.D.   On: 03/15/2019 00:32   {  Assessment and Plan:   1. Elevated troponin  -Hs-troponin 638>>557>>642>>557 -Seems demand in setting of most recent GI bleed.  He denies any symptoms concerning for angina or prior MI  2.  CAD s/p CABG and stenting -Last cath 06/2013 as above -No anginal symptoms -His aspirin was held last week secondary to upper GI bleed>> plan was to restart today. Will review with MD and primary team when is okay to restart - Stopped statin last admission 2nd to transaminitis/metastatic liver disease - Coreg on hold 2nd to low BP  3.  GI bleed/anemia 2nd to bleeding gastric AVM s/p ablation. -Hemoglobin stable - AS above  4.  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 Alvy Bimler as outpatient on 3/4  5. Elevated TSH - 7.4 (2/25)>>> 11.066 today - Per primary team   6.  Diarrhea -Likely secondary to use of MiraLAX -Improved here  7.  Peripheral vascular disease -Followed by Dr. Trula Slade  (due to see) - Plavix discontinued last admission 2nd to UGI bleed  Dr. Stanford Breed to see later today.   For questions or updates, please contact [place] Please consult [email or web address] for contact info under     SignedLeanor Kail, PA  03/15/2019 9:11 AM   As above, patient seen and examined.  Briefly he is a 64 year old male with  past medical history of coronary artery disease status post coronary artery bypass graft, hypertension, hyperlipidemia, tobacco abuse, peripheral vascular disease, carotid artery disease, throat cancer status post radiation for evaluation of elevated troponin.  Patient has had recent GI bleeding (admitted last week).  Endoscopy showed a bleeding gastric AVM and he is now status post ablation.  Aspirin and Plavix were held.  There was also concern for metastatic liver disease and he was advised to follow-up with oncology.  Patient has had problems with abdominal pain/constipation and took MiraLAX.  He has subsequently had continuing diarrhea, nausea/vomiting and decreased p.o. intake.  He has dizziness with standing.  He was admitted and troponin noted to be elevated and cardiology asked to evaluate.  He denies chest pain or dyspnea.  Troponins are 638, 557, 642 and 557.  Electrocardiogram shows sinus rhythm with nonspecific ST changes; cannot rule out septal infarct.  1 elevated troponin-patient has had no cardiac symptoms.  He denies chest pain or dyspnea.  His troponin is minimally elevated but there is no clear trend.  I do not think this represents acute coronary syndrome.  Would not pursue further ischemia evaluation.  2 orthostasis-patient has had decreased p.o. intake, nausea/vomiting and diarrhea.  I agree with hydration.  3 coronary artery disease-would resume aspirin 81 mg daily when okay from a GI standpoint.  Statin has been discontinued because of metastatic liver disease and transaminitis.  4 metastatic cancer-follow-up oncology.  Cardiology will sign off.  Please call with questions.  Kirk Ruths

## 2019-03-15 NOTE — Progress Notes (Signed)
Initial Nutrition Assessment  RD working remotely.  DOCUMENTATION CODES:   Not applicable  INTERVENTION:   -Ensure Enlive po TID, each supplement provides 350 kcal and 20 grams of protein -MVI with minerals daily -Magic cup TID with meals, each supplement provides 290 kcal and 9 grams of protein  NUTRITION DIAGNOSIS:   Increased nutrient needs related to chronic illness(history of laryngeal cancer) as evidenced by estimated needs.  GOAL:   Patient will meet greater than or equal to 90% of their needs  MONITOR:   PO intake, Supplement acceptance, Diet advancement, Labs, Weight trends, Skin, I & O's  REASON FOR ASSESSMENT:   Malnutrition Screening Tool    ASSESSMENT:   [name] is an 64 y.o. male with a past medical history that includes CAD status post CABG, carotid stents, cardiac arrest, recently hospitalized for GI bleed discharged 3 days ago resents to the emergency department with a chief complaint abdominal pain diarrhea after taking MiraLAX for constipation.  Work-up in the emergency department revealed orthostatic hypotension and mildly elevated troponins.  Pt admitted with hypotension and generalized weakness.   Reviewed I/O's: +1.3 L x 24 hours  Attempted to speak with pt via phone, however, no answer.   Pt familiar to this RD due to recent admission last week for GIB. Per chart review, pt son reports ongoing weakness since discharge.   Reviewed wt hx; unsure if admission wt is accurate. Used current wt of 73.3 kg to estimate needs, which appears to be close to pt UBW per review of wt hx.   Pt was just advanced to a full liquid diet. Noted that pt was identified with severe malnutrition secondary to acute illness upon prior admission. RD suspects malnutrition is ongoing, however, unable to identify at this time. Pt would greatly benefit from addition of oral nutrition supplements.   Medications reviewed and include 0.9% sodium chloride infusion @ 100  ml/hr.   Labs reviewed: Na: 134.   Diet Order:   Diet Order            Diet full liquid Room service appropriate? Yes; Fluid consistency: Thin  Diet effective now              EDUCATION NEEDS:   No education needs have been identified at this time  Skin:  Skin Assessment: Reviewed RN Assessment  Last BM:  03/15/19  Height:   Ht Readings from Last 1 Encounters:  03/15/19 5\' 7"  (1.702 m)    Weight:   Wt Readings from Last 1 Encounters:  03/15/19 73.3 kg    Ideal Body Weight:  67.3 kg  BMI:  Body mass index is 25.31 kg/m.  Estimated Nutritional Needs:   Kcal:  2000-2200  Protein:  95-110 grams  Fluid:  > 2 L    Loistine Chance, RD, LDN, [place] Registered Dietitian II Certified Diabetes Care and Education Specialist Please refer to [facility] for RD and/or RD on-call/weekend/after hours pager

## 2019-03-15 NOTE — Progress Notes (Signed)
Brief Oncology Note:  We are aware of the patient's admission. Will plan to see him as an outpatient as previously scheduled on 03/17/2019. If is is still admitted, we can push this appointment back.   Mikey Bussing, DNP, AGPCNP-BC, AOCNP

## 2019-03-15 NOTE — Progress Notes (Signed)
Patient's daughter Judson Roch) called in to get follow up.  Provided Sarah with information about his father and that more will be known once doctors round this morning.

## 2019-03-16 DIAGNOSIS — R000 Tachycardia, unspecified: Secondary | ICD-10-CM | POA: Diagnosis present

## 2019-03-16 DIAGNOSIS — M1990 Unspecified osteoarthritis, unspecified site: Secondary | ICD-10-CM | POA: Diagnosis present

## 2019-03-16 DIAGNOSIS — R197 Diarrhea, unspecified: Secondary | ICD-10-CM | POA: Diagnosis present

## 2019-03-16 DIAGNOSIS — Z951 Presence of aortocoronary bypass graft: Secondary | ICD-10-CM | POA: Diagnosis not present

## 2019-03-16 DIAGNOSIS — F329 Major depressive disorder, single episode, unspecified: Secondary | ICD-10-CM | POA: Diagnosis present

## 2019-03-16 DIAGNOSIS — Y842 Radiological procedure and radiotherapy as the cause of abnormal reaction of the patient, or of later complication, without mention of misadventure at the time of the procedure: Secondary | ICD-10-CM | POA: Diagnosis present

## 2019-03-16 DIAGNOSIS — F419 Anxiety disorder, unspecified: Secondary | ICD-10-CM | POA: Diagnosis present

## 2019-03-16 DIAGNOSIS — I951 Orthostatic hypotension: Secondary | ICD-10-CM | POA: Diagnosis present

## 2019-03-16 DIAGNOSIS — Z66 Do not resuscitate: Secondary | ICD-10-CM | POA: Diagnosis not present

## 2019-03-16 DIAGNOSIS — I739 Peripheral vascular disease, unspecified: Secondary | ICD-10-CM | POA: Diagnosis present

## 2019-03-16 DIAGNOSIS — I469 Cardiac arrest, cause unspecified: Secondary | ICD-10-CM | POA: Diagnosis not present

## 2019-03-16 DIAGNOSIS — E874 Mixed disorder of acid-base balance: Secondary | ICD-10-CM | POA: Diagnosis present

## 2019-03-16 DIAGNOSIS — I255 Ischemic cardiomyopathy: Secondary | ICD-10-CM | POA: Diagnosis present

## 2019-03-16 DIAGNOSIS — Z95820 Peripheral vascular angioplasty status with implants and grafts: Secondary | ICD-10-CM | POA: Diagnosis not present

## 2019-03-16 DIAGNOSIS — K5900 Constipation, unspecified: Secondary | ICD-10-CM | POA: Diagnosis present

## 2019-03-16 DIAGNOSIS — D638 Anemia in other chronic diseases classified elsewhere: Secondary | ICD-10-CM | POA: Diagnosis present

## 2019-03-16 DIAGNOSIS — I2510 Atherosclerotic heart disease of native coronary artery without angina pectoris: Secondary | ICD-10-CM | POA: Diagnosis present

## 2019-03-16 DIAGNOSIS — Z8521 Personal history of malignant neoplasm of larynx: Secondary | ICD-10-CM | POA: Diagnosis not present

## 2019-03-16 DIAGNOSIS — Z20822 Contact with and (suspected) exposure to covid-19: Secondary | ICD-10-CM | POA: Diagnosis present

## 2019-03-16 DIAGNOSIS — C787 Secondary malignant neoplasm of liver and intrahepatic bile duct: Secondary | ICD-10-CM | POA: Diagnosis present

## 2019-03-16 DIAGNOSIS — I5042 Chronic combined systolic (congestive) and diastolic (congestive) heart failure: Secondary | ICD-10-CM | POA: Diagnosis present

## 2019-03-16 DIAGNOSIS — G9340 Encephalopathy, unspecified: Secondary | ICD-10-CM | POA: Diagnosis not present

## 2019-03-16 DIAGNOSIS — T17590A Other foreign object in bronchus causing asphyxiation, initial encounter: Secondary | ICD-10-CM | POA: Diagnosis not present

## 2019-03-16 DIAGNOSIS — J9600 Acute respiratory failure, unspecified whether with hypoxia or hypercapnia: Secondary | ICD-10-CM | POA: Diagnosis not present

## 2019-03-16 DIAGNOSIS — K922 Gastrointestinal hemorrhage, unspecified: Secondary | ICD-10-CM | POA: Diagnosis present

## 2019-03-16 LAB — CBC
HCT: 33.8 % — ABNORMAL LOW (ref 39.0–52.0)
Hemoglobin: 11.5 g/dL — ABNORMAL LOW (ref 13.0–17.0)
MCH: 30.7 pg (ref 26.0–34.0)
MCHC: 34 g/dL (ref 30.0–36.0)
MCV: 90.4 fL (ref 80.0–100.0)
Platelets: 248 10*3/uL (ref 150–400)
RBC: 3.74 MIL/uL — ABNORMAL LOW (ref 4.22–5.81)
RDW: 19 % — ABNORMAL HIGH (ref 11.5–15.5)
WBC: 17.1 10*3/uL — ABNORMAL HIGH (ref 4.0–10.5)
nRBC: 0 % (ref 0.0–0.2)

## 2019-03-16 LAB — BASIC METABOLIC PANEL
Anion gap: 14 (ref 5–15)
BUN: 11 mg/dL (ref 8–23)
CO2: 18 mmol/L — ABNORMAL LOW (ref 22–32)
Calcium: 7.9 mg/dL — ABNORMAL LOW (ref 8.9–10.3)
Chloride: 103 mmol/L (ref 98–111)
Creatinine, Ser: 0.93 mg/dL (ref 0.61–1.24)
GFR calc Af Amer: >60 mL/min (ref >60)
GFR calc non Af Amer: >60 mL/min (ref >60)
Glucose, Bld: 101 mg/dL — ABNORMAL HIGH (ref 70–99)
Potassium: 4.9 mmol/L (ref 3.5–5.1)
Sodium: 135 mmol/L (ref 135–145)

## 2019-03-16 LAB — C DIFFICILE QUICK SCREEN W PCR REFLEX
C Diff antigen: NEGATIVE
C Diff interpretation: NOT DETECTED
C Diff toxin: NEGATIVE

## 2019-03-16 MED ORDER — SODIUM CHLORIDE 0.9 % IV SOLN: INTRAVENOUS | Status: AC

## 2019-03-16 MED ORDER — ASPIRIN EC 81 MG PO TBEC
81.0000 mg | DELAYED_RELEASE_TABLET | Freq: Every day | ORAL | Status: DC
  Administered 2019-03-16 – 2019-03-17 (×2): 81 mg via ORAL
  Filled 2019-03-16: qty 1

## 2019-03-16 NOTE — Evaluation (Signed)
Physical Therapy Evaluation Patient Details Name: [name] MRN: [id number] DOB: 07-Mar-1955 Today's Date: 03/16/2019   History of Present Illness   64 y.o. male with medical history significant of HTN, laryngeal cancer tobacco and alcohol abuse, anxiety, carotid artery stenosis, remote hx of heart murmur, PAD, heart failure, CAD s/p CABG, cardiac arrest 2015. Presented (2/24) with  3 day of black vomit and black stools. admitted to hospital 2/24-2/27. Returned to ED 3/1 w/ c/o abdominal pain, n/v/d.  Clinical Impression   Pt admitted with above diagnosis. PTA (2/24) pt was independent with all ADLs and IADLs, was seen at this hospital and found to be functioning at almost baseline not needing any continued acute care level PT tx. Sine d/c home with family he reports has not been able to walk and has been needing increasing assistance with mobility and ADLs.  Pt currently with functional limitations due to the deficits listed below (see PT Problem List). This am pt was needing helo with bed mobility, sit<>stand, ambulation and was using RW for ambulation. PT needed mod a with bed mob and positioning, needed min a and RW for transfers and min a to ambulate approx 11ft with RW. Pt will benefit from skilled PT to increase his overall strength, balance and coordination, activity tolerance, independence and safety with mobility to allow discharge to the venue listed below.       Follow Up Recommendations Home health PT    Equipment Recommendations  None recommended by PT    Recommendations for Other Services       Precautions / Restrictions Precautions Precautions: Fall Restrictions Weight Bearing Restrictions: No      Mobility  Bed Mobility Overal bed mobility: Needs Assistance Bed Mobility: Supine to Sit;Sit to Supine     Supine to sit: Mod assist Sit to supine: Mod assist      Transfers Overall transfer level: Needs assistance Equipment used: Rolling walker (2  wheeled) Transfers: Sit to/from Stand Sit to Stand: Min guard;Supervision            Ambulation/Gait Ambulation/Gait assistance: Min guard Gait Distance (Feet): 80 Feet Assistive device: Rolling walker (2 wheeled) Gait Pattern/deviations: Step-through pattern;Decreased stride length Gait velocity: very slow      Stairs            Wheelchair Mobility    Modified Rankin (Stroke Patients Only)       Balance Overall balance assessment: Needs assistance Sitting-balance support: Feet supported Sitting balance-Leahy Scale: Fair Sitting balance - Comments: sits unsupported edge of bed w/ no losses of balance                                     Pertinent Vitals/Pain Pain Assessment: No/denies pain Pain Intervention(s): Limited activity within patient's tolerance;Monitored during session    [place] expects to be discharged to:: Private residence Living Arrangements: Children Available Help at Discharge: Family;Available 24 hours/day Type of Home: House Home Access: Stairs to enter   [employer] of Steps: 2 Home Layout: One level Home Equipment: Walker - 2 wheels      Prior Function Level of Independence: Independent         Comments: was independent prior to last hospitalization but since has been depending on family for assistance     Hand Dominance   Dominant Hand: Right    Extremity/Trunk Assessment   Upper Extremity Assessment Upper Extremity Assessment: Generalized  weakness    Lower Extremity Assessment Lower Extremity Assessment: Generalized weakness    Cervical / Trunk Assessment Cervical / Trunk Assessment: Normal  Communication   Communication: Other (comment)(hoarse voice )  Cognition Arousal/Alertness: Awake/alert Behavior During Therapy: WFL for tasks assessed/performed Overall Cognitive Status: Within Functional Limits for tasks assessed                                         General Comments      Exercises     Assessment/Plan    PT Assessment Patient needs continued PT services  PT Problem List Decreased strength;Decreased activity tolerance;Decreased balance;Decreased mobility;Decreased coordination;Decreased knowledge of use of DME;Decreased safety awareness       PT Treatment Interventions Gait training;Stair training;Functional mobility training;Therapeutic activities;Therapeutic exercise;Balance training;Neuromuscular re-education;Patient/family education    PT Goals (Current goals can be found in the Care Plan section)  Acute Rehab PT Goals Patient Stated Goal: get better PT Goal Formulation: With patient/family Potential to Achieve Goals: Fair    Frequency Min 3X/week   Barriers to discharge        Co-evaluation               AM-PAC PT "6 Clicks" Mobility  Outcome Measure Help needed turning from your back to your side while in a flat bed without using bedrails?: A Little Help needed moving from lying on your back to sitting on the side of a flat bed without using bedrails?: A Lot Help needed moving to and from a bed to a chair (including a wheelchair)?: A Lot Help needed standing up from a chair using your arms (e.g., wheelchair or bedside chair)?: A Lot Help needed to walk in hospital room?: A Little Help needed climbing 3-5 steps with a railing? : A Lot 6 Click Score: 14    End of Session Equipment Utilized During Treatment: Gait belt Activity Tolerance: Patient limited by fatigue;Patient limited by lethargy;Treatment limited secondary to medical complications (Comment) Patient left: in bed;with call bell/phone within reach;with family/visitor present Nurse Communication: Mobility status PT Visit Diagnosis: Unsteadiness on feet (R26.81);Other abnormalities of gait and mobility (R26.89);Muscle weakness (generalized) (M62.81)    Time: [id number] PT Time Calculation (min) (ACUTE ONLY): 26 min   Charges:   PT  Evaluation $PT Eval Moderate Complexity: 1 Mod PT Treatments $Therapeutic Activity: 8-22 mins        Horald Chestnut, PT   Delford Field 03/16/2019, 12:55 PM

## 2019-03-16 NOTE — Plan of Care (Signed)
Pt alert and oriented x 4 denies pain but has c/o diarrhea multiple loose stools will discuss with MD this AM otherwise no concerns voiced. On room air with acute distress noted tele monitored by staff current sinus rhythm. X 1 assist with Mobility able to move self in bed no skin concerns, decreased appetite encouraging meal and fluid intake. Bed in low position call bell within reach staff will continue to monitor

## 2019-03-16 NOTE — Progress Notes (Addendum)
PROGRESS NOTE    [name]  [id number] DOB: Dec 17, 1955 DOA: 03/19/2019 PCP: [name], NP   Brief Narrative:  HPI: [name] is a 64 y.o. male with history of CAD status post CABG, carotid stents, cardiac arrest, recently admitted for GI bleed discharged on March 11, 2018 about 4 days ago since discharge has been having some constipation tried MiraLAX following which patient started having 3-4 episodes diarrhea and has been having poor appetite and unable to eat since patient tries to eat patient has vomiting.  Diarrhea was dark stools.  Given the symptoms patient presents to the ER.  During the last day patient had gastric AVMs which were ablated.  ED Course: In the ER patient's labs show hemoglobin 10.7 elevated LFTs which were elevated during recent admission.  High sensitive troponin was 630 and 557 EKG shows diffuse ST depression which are comparable to the recent EKG done on March 10, 2019.  Patient denies any chest pain or shortness of breath.  Chest x-ray unremarkable Covid test was negative.  Patient admitted for further observation.  Assessment & Plan:   Principal Problem:   Hypotension Active Problems:   Anemia in chronic illness   Ischemic cardiomyopathy, EF 45%   PAD (peripheral artery disease) (HCC)   Troponin level elevated   Elevated LFTs   Diarrhea   Weakness generalized   #1.  Hypotension/generalized weakness.  Likely related to volume loss the setting of diarrhea and emesis with decreased oral intake.Marland Kitchen  Has a history of hypertension and home medications include Coreg.  Quite orthostatic in the emergency department.  Has been provided IV fluids.  The pressure remains on the low end of normal. -Continue to hold home antihypertensives.  Resume IV fluids.  PT consult.  #2.  Elevated troponin.  Patient with a history of ischemic cardiomyopathy/CAD status post CABG and stenting.  Not currently on aspirin due to recent GI bleed.  Not  currently on a statin due to transaminitis/metastatic liver disease. denies chest pain.  Evaluated by cardiology who opine his troponins minimally elevated with no clear trend not likely representing acute coronary syndrome recommend no further ischemia evaluation.  Patient denies chest pain. -Resume aspirin.  #3.  Recent GI bleed.  Has had gastric issues which were ablated.  Hemoglobin stable.  Home medications include Protonix.  Denies any nausea or emesis since admission.  Tolerating heart healthy diet  Resume aspirin today.  Hemoglobin is stable.  #4.  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  Last hospitalization had elevated LFTs.  Follow-up with oncology as outpatient.  #5.  Diarrhea.  Patient took [place] prior to admission for constipation.  He had multiple watery stools prior to presentation as noted in HPI.  Patient's last dose of MiraLAX was 4 days ago but he continues to have persistent watery bowel movements.  Him, he has had 4 bowel movements this morning.  No abdominal pain or any fever.  Will check C. difficile, stool culture and ova and parasite.  Contact isolation.   DVT prophylaxis: SCD due to recent GI bleed Code Status: Code Family Communication: Son present at bedside.  Plan of care discussed with patient in length with both of them and they verbalized understanding and agreed with it. Patient is from: Home Disposition Plan: To be determined based on therapy assessment Barriers to discharge: Persistent diarrhea needs further work-up   Estimated body mass index is 24.79 kg/m as calculated from the following:   Height as  of this encounter: 5\' 7"  (1.702 m).   Weight as of this encounter: 71.8 kg.      Nutritional status:  Nutrition Problem: Increased nutrient needs Etiology: chronic illness(history of laryngeal cancer)   Signs/Symptoms: estimated needs   Interventions: MVI    Consultants:  None Procedures:    None  Antimicrobials:   None   Subjective: Seen and examined.  Complains of diarrhea.  Has had 4 loose bowel movements this morning.  Other complaint.  Son at the bedside.  Objective: Vitals:   03/16/19 0056 03/16/19 0100 03/16/19 0300 03/16/19 0800  BP: 117/70     Pulse: 94     Resp: 17   20  Temp: 98.1 F (36.7 C)     TempSrc: Oral     SpO2:      Weight:  73.3 kg 71.8 kg   Height:        Intake/Output Summary (Last 24 hours) at 03/16/2019 1218 Last data filed at 03/15/2019 2000 Gross per 24 hour  Intake 100 ml  Output --  Net 100 ml   Filed Weights   03/15/19 0053 03/16/19 0100 03/16/19 0300  Weight: 73.3 kg 73.3 kg 71.8 kg    Examination:  General exam: Appears calm and comfortable but looks weak Respiratory system: Clear to auscultation. Respiratory effort normal. Cardiovascular system: S1 & S2 heard, RRR. No JVD, murmurs, rubs, gallops or clicks. No pedal edema. Gastrointestinal system: Abdomen is nondistended, soft and nontender. No organomegaly or masses felt. Normal bowel sounds heard. Central nervous system: Alert and oriented. No focal neurological deficits. Extremities: Symmetric 5 x 5 power. Skin: No rashes, lesions or ulcers Psychiatry: Judgement and insight appear normal. Mood & affect appropriate.    Data Reviewed: I have personally reviewed following labs and imaging studies  CBC: Recent Labs  Lab 03/11/19 0254 03/12/19 0231 04/01/2019 1254 03/15/19 0212 03/15/19 0401  WBC 11.7* 11.4* 11.5* 11.3* 11.5*  HGB 9.7* 9.8* 10.7* 10.6* 10.5*  HCT 28.5* 29.2* 31.8* 29.8* 30.4*  MCV 90.8 90.4 91.4 87.9 88.9  PLT 259 269 275 244 0000000   Basic Metabolic Panel: Recent Labs  Lab 03/10/19 0527 03/11/19 0254 03/12/19 0231 04/11/2019 1254 03/15/19 0212  NA 134* 133* 133* 131* 134*  K 4.5 4.3 3.9 4.4 4.2  CL 96* 97* 97* 95* 101  CO2 25 25 26 24 23   GLUCOSE 75 83 91 105* 95  BUN 11 11 8 10 12   CREATININE 1.01 1.09 0.92 1.14 1.04  CALCIUM 8.3*  8.3* 8.1* 8.3* 8.0*  MG 2.0  --   --   --  2.0  PHOS 2.9  --   --   --   --    GFR: Estimated Creatinine Clearance: 65.3 mL/min (by C-G formula based on SCr of 1.04 mg/dL). Liver Function Tests: Recent Labs  Lab 03/10/19 0527 03/11/19 0254 04/09/2019 1254  AST 340* 313* 476*  ALT 168* 151* 174*  ALKPHOS 659* 581* 650*  BILITOT 1.7* 2.0* 2.1*  PROT 5.2* 5.1* 5.3*  ALBUMIN 2.4* 2.2* 2.3*   Recent Labs  Lab 04/01/2019 1254  LIPASE 44   No results for input(s): AMMONIA in the last 168 hours. Coagulation Profile: No results for input(s): INR, PROTIME in the last 168 hours. Cardiac Enzymes: No results for input(s): CKTOTAL, CKMB, CKMBINDEX, TROPONINI in the last 168 hours. BNP (last 3 results) No results for input(s): PROBNP in the last 8760 hours. HbA1C: No results for input(s): HGBA1C in the last 72 hours. CBG: No  results for input(s): GLUCAP in the last 168 hours. Lipid Profile: No results for input(s): CHOL, HDL, LDLCALC, TRIG, CHOLHDL, LDLDIRECT in the last 72 hours. Thyroid Function Tests: Recent Labs    03/15/19 0212 03/15/19 1113  TSH 11.066*  --   FREET4  --  0.92   Anemia Panel: No results for input(s): VITAMINB12, FOLATE, FERRITIN, TIBC, IRON, RETICCTPCT in the last 72 hours. Sepsis Labs: Recent Labs  Lab 03/15/19 0212 03/15/19 0450  LATICACIDVEN 2.7* 1.8    Recent Results (from the past 240 hour(s))  SARS CORONAVIRUS 2 (TAT 6-24 HRS) Nasopharyngeal Nasopharyngeal Swab     Status: None   Collection Time: 03/09/19  1:33 PM   Specimen: Nasopharyngeal Swab  Result Value Ref Range Status   SARS Coronavirus 2 NEGATIVE NEGATIVE Final    Comment: (NOTE) SARS-CoV-2 target nucleic acids are NOT DETECTED. The SARS-CoV-2 RNA is generally detectable in upper and lower respiratory specimens during the acute phase of infection. Negative results do not preclude SARS-CoV-2 infection, do not rule out co-infections with other pathogens, and should not be used as  the sole basis for treatment or other patient management decisions. Negative results must be combined with clinical observations, patient history, and epidemiological information. The expected result is Negative. Fact Sheet for Patients: SugarRoll.be Fact Sheet for Healthcare Providers: https://www.woods-mathews.com/ This test is not yet approved or cleared by the [place] FDA and  has been authorized for detection and/or diagnosis of SARS-CoV-2 by FDA under an Emergency Use Authorization (EUA). This EUA will remain  in effect (meaning this test can be used) for the duration of the COVID-19 declaration under Section 56 4(b)(1) of the Act, 21 U.S.C. section 360bbb-3(b)(1), unless the authorization is terminated or revoked sooner. Performed at [facility], [place] [address] [place], [place] [address]   SARS CORONAVIRUS 2 (TAT 6-24 HRS) Nasopharyngeal Nasopharyngeal Swab     Status: None   Collection Time: 03/15/19 12:09 AM   Specimen: Nasopharyngeal Swab  Result Value Ref Range Status   SARS Coronavirus 2 NEGATIVE NEGATIVE Final    Comment: (NOTE) SARS-CoV-2 target nucleic acids are NOT DETECTED. The SARS-CoV-2 RNA is generally detectable in upper and lower respiratory specimens during the acute phase of infection. Negative results do not preclude SARS-CoV-2 infection, do not rule out co-infections with other pathogens, and should not be used as the sole basis for treatment or other patient management decisions. Negative results must be combined with clinical observations, patient history, and epidemiological information. The expected result is Negative. Fact Sheet for Patients: SugarRoll.be Fact Sheet for Healthcare Providers: https://www.woods-mathews.com/ This test is not yet approved or cleared by the [place] FDA and  has been authorized for detection and/or diagnosis of SARS-CoV-2  by FDA under an Emergency Use Authorization (EUA). This EUA will remain  in effect (meaning this test can be used) for the duration of the COVID-19 declaration under Section 56 4(b)(1) of the Act, 21 U.S.C. section 360bbb-3(b)(1), unless the authorization is terminated or revoked sooner. Performed at [facility], [place] [address], [place], [place] [address]       Radiology Studies: CT RENAL STONE STUDY  Result Date: 03/15/2019 CLINICAL DATA:  Abdominal pain EXAM: CT ABDOMEN AND PELVIS WITHOUT CONTRAST TECHNIQUE: Multidetector CT imaging of the abdomen and pelvis was performed following the standard protocol without IV contrast. COMPARISON:  03/09/2019 FINDINGS: Lower chest: Small bilateral pleural effusions are noted with associated atelectatic changes. These are new from the prior exam. Diffuse decreased  attenuation is noted within the cardiac blood pool consistent with anemia. Hepatobiliary: Given the lack of IV contrast the diffuse heterogeneity of the liver is less well appreciated although changes consistent with the known metastatic disease throughout the liver are again identified. Vicarious excretion of contrast is noted within the gallbladder. Pancreas: Unremarkable. No pancreatic ductal dilatation or surrounding inflammatory changes. Spleen: Normal in size without focal abnormality. Adrenals/Urinary Tract: Adrenal glands are within normal limits. Kidneys show no renal calculi or obstructive change. The bladder is well distended. Stomach/Bowel: Postsurgical changes are noted in the sigmoid colon. No obstructive changes are noted. The appendix is within normal limits. No small bowel abnormality is seen. Vascular/Lymphatic: Diffuse vascular calcifications are noted. Changes consistent with right iliac artery stenting are noted. Reproductive: Prostate is unremarkable. Other: Mild free fluid is noted within the pelvis this is increased slightly in the interval prior exam. Fat containing left  lower quadrant hernia is noted at the area of previous colostomy. This is stable from the prior exam. Musculoskeletal: Degenerative changes of lumbar spine are seen. IMPRESSION: Stable changes in the liver consistent with known hepatic metastatic disease. New bilateral pleural effusions with associated atelectatic changes. Changes suggestive of anemia in the cardiac blood pool. Postsurgical changes as described above. Electronically Signed   By: Inez Catalina M.D.   On: 03/15/2019 00:32    Scheduled Meds: . ALPRAZolam  0.5 mg Oral BID  . buPROPion  100 mg Oral TID  . feeding supplement (ENSURE ENLIVE)  237 mL Oral TID BM  . multivitamin with minerals  1 tablet Oral Daily  . pantoprazole  40 mg Oral BID  . sodium chloride flush  3 mL Intravenous Once   Continuous Infusions:   LOS: 0 days   Time spent: 35 minutes   Darliss Cheney, MD [facility]  03/16/2019, 12:18 PM   To contact the attending provider between 7A-7P or the covering provider during after hours 7P-7A, please log into the web site [email or web address].

## 2019-03-17 ENCOUNTER — Inpatient Hospital Stay (HOSPITAL_COMMUNITY): Payer: Medicare Other | Admitting: Certified Registered Nurse Anesthetist

## 2019-03-17 ENCOUNTER — Ambulatory Visit: Payer: Self-pay | Admitting: Hematology and Oncology

## 2019-03-17 ENCOUNTER — Inpatient Hospital Stay (HOSPITAL_COMMUNITY): Payer: Medicare Other

## 2019-03-17 DIAGNOSIS — I959 Hypotension, unspecified: Secondary | ICD-10-CM

## 2019-03-17 DIAGNOSIS — Z978 Presence of other specified devices: Secondary | ICD-10-CM

## 2019-03-17 DIAGNOSIS — I255 Ischemic cardiomyopathy: Secondary | ICD-10-CM

## 2019-03-17 DIAGNOSIS — T17908A Unspecified foreign body in respiratory tract, part unspecified causing other injury, initial encounter: Secondary | ICD-10-CM

## 2019-03-17 DIAGNOSIS — J9600 Acute respiratory failure, unspecified whether with hypoxia or hypercapnia: Secondary | ICD-10-CM

## 2019-03-17 DIAGNOSIS — I469 Cardiac arrest, cause unspecified: Secondary | ICD-10-CM

## 2019-03-17 LAB — POCT I-STAT 7, (LYTES, BLD GAS, ICA,H+H)
Acid-base deficit: 13 mmol/L — ABNORMAL HIGH (ref 0.0–2.0)
Bicarbonate: 16 mmol/L — ABNORMAL LOW (ref 20.0–28.0)
Calcium, Ion: 1.11 mmol/L — ABNORMAL LOW (ref 1.15–1.40)
HCT: 33 % — ABNORMAL LOW (ref 39.0–52.0)
Hemoglobin: 11.2 g/dL — ABNORMAL LOW (ref 13.0–17.0)
O2 Saturation: 79 %
Patient temperature: 101.9
Potassium: 3.8 mmol/L (ref 3.5–5.1)
Sodium: 143 mmol/L (ref 135–145)
TCO2: 17 mmol/L — ABNORMAL LOW (ref 22–32)
pCO2 arterial: 52.7 mmHg — ABNORMAL HIGH (ref 32.0–48.0)
pH, Arterial: 7.102 — CL (ref 7.350–7.450)
pO2, Arterial: 65 mmHg — ABNORMAL LOW (ref 83.0–108.0)

## 2019-03-17 LAB — CBC
HCT: 29.3 % — ABNORMAL LOW (ref 39.0–52.0)
Hemoglobin: 10.1 g/dL — ABNORMAL LOW (ref 13.0–17.0)
MCH: 30.9 pg (ref 26.0–34.0)
MCHC: 34.5 g/dL (ref 30.0–36.0)
MCV: 89.6 fL (ref 80.0–100.0)
Platelets: 221 10*3/uL (ref 150–400)
RBC: 3.27 MIL/uL — ABNORMAL LOW (ref 4.22–5.81)
RDW: 19.2 % — ABNORMAL HIGH (ref 11.5–15.5)
WBC: 13.6 10*3/uL — ABNORMAL HIGH (ref 4.0–10.5)
nRBC: 0.1 % (ref 0.0–0.2)

## 2019-03-17 LAB — GLUCOSE, CAPILLARY
Glucose-Capillary: 174 mg/dL — ABNORMAL HIGH (ref 70–99)
Glucose-Capillary: 54 mg/dL — ABNORMAL LOW (ref 70–99)

## 2019-03-17 LAB — BASIC METABOLIC PANEL
Anion gap: 10 (ref 5–15)
BUN: 9 mg/dL (ref 8–23)
CO2: 22 mmol/L (ref 22–32)
Calcium: 7.9 mg/dL — ABNORMAL LOW (ref 8.9–10.3)
Chloride: 105 mmol/L (ref 98–111)
Creatinine, Ser: 0.96 mg/dL (ref 0.61–1.24)
GFR calc Af Amer: >60 mL/min (ref >60)
GFR calc non Af Amer: >60 mL/min (ref >60)
Glucose, Bld: 105 mg/dL — ABNORMAL HIGH (ref 70–99)
Potassium: 3.8 mmol/L (ref 3.5–5.1)
Sodium: 137 mmol/L (ref 135–145)

## 2019-03-17 MED ORDER — PHENYLEPHRINE HCL-NACL 10-0.9 MG/250ML-% IV SOLN
25.0000 ug/min | INTRAVENOUS | Status: DC
  Administered 2019-03-17: 200 ug/min via INTRAVENOUS

## 2019-03-17 MED ORDER — CHLORHEXIDINE GLUCONATE CLOTH 2 % EX PADS: 6.0000 | MEDICATED_PAD | Freq: Every day | CUTANEOUS | Status: DC

## 2019-03-17 MED ORDER — GLYCOPYRROLATE 0.2 MG/ML IJ SOLN: 0.2000 mg | INTRAMUSCULAR | Status: DC | PRN

## 2019-03-17 MED ORDER — SODIUM CHLORIDE 0.9 % IV SOLN: 50.0000 ug/h | INTRAVENOUS | Status: DC

## 2019-03-17 MED ORDER — DEXTROSE 50 % IV SOLN
INTRAVENOUS | Status: AC
  Administered 2019-03-17: 25 g via INTRAVENOUS
  Filled 2019-03-17: qty 100

## 2019-03-17 MED ORDER — LORAZEPAM 2 MG/ML IJ SOLN
2.0000 mg | INTRAMUSCULAR | Status: DC | PRN
  Filled 2019-03-17: qty 2

## 2019-03-17 MED ORDER — POLYVINYL ALCOHOL 1.4 % OP SOLN: 1.0000 [drp] | Freq: Four times a day (QID) | OPHTHALMIC | Status: DC | PRN

## 2019-03-17 MED ORDER — GLYCOPYRROLATE 1 MG PO TABS
1.0000 mg | ORAL_TABLET | ORAL | Status: DC | PRN
  Filled 2019-03-17: qty 1

## 2019-03-17 MED ORDER — LOPERAMIDE HCL 2 MG PO CAPS: 2.0000 mg | ORAL_CAPSULE | ORAL | Status: DC | PRN

## 2019-03-17 MED ORDER — SODIUM CHLORIDE 0.9 % IV SOLN: 250.0000 mL | INTRAVENOUS | Status: DC

## 2019-03-17 MED ORDER — MORPHINE 100MG IN NS 100ML (1MG/ML) PREMIX INFUSION
0.0000 mg/h | INTRAVENOUS | Status: DC
  Administered 2019-03-17: 5 mg/h via INTRAVENOUS
  Filled 2019-03-17: qty 100

## 2019-03-17 MED ORDER — SODIUM CHLORIDE 0.9% IV SOLUTION: Freq: Once | INTRAVENOUS | Status: DC

## 2019-03-17 MED ORDER — SODIUM BICARBONATE 8.4 % IV SOLN
INTRAVENOUS | Status: AC
  Filled 2019-03-17: qty 50

## 2019-03-17 MED ORDER — MORPHINE BOLUS VIA INFUSION
5.0000 mg | INTRAVENOUS | Status: DC | PRN
  Filled 2019-03-17: qty 5

## 2019-03-17 MED ORDER — SODIUM CHLORIDE 0.9 % IV SOLN
50.0000 ug/h | INTRAVENOUS | Status: DC
  Filled 2019-03-17: qty 1

## 2019-03-17 MED ORDER — EPINEPHRINE 1 MG/10ML IJ SOSY
PREFILLED_SYRINGE | INTRAMUSCULAR | Status: AC
  Filled 2019-03-17: qty 20

## 2019-03-17 MED ORDER — MORPHINE SULFATE (PF) 2 MG/ML IV SOLN
2.0000 mg | INTRAVENOUS | Status: DC | PRN
  Administered 2019-03-17: 2 mg via INTRAVENOUS

## 2019-03-17 MED ORDER — ALBUMIN HUMAN 5 % IV SOLN
INTRAVENOUS | Status: AC
  Filled 2019-03-17: qty 500

## 2019-03-17 MED ORDER — NOREPINEPHRINE 4 MG/250ML-% IV SOLN
2.0000 ug/min | INTRAVENOUS | Status: DC
  Administered 2019-03-17: 10 ug/min via INTRAVENOUS

## 2019-03-17 MED ORDER — DEXTROSE 50 % IV SOLN: 25.0000 g | INTRAVENOUS | Status: AC

## 2019-03-17 MED ORDER — PHENYLEPHRINE HCL-NACL 10-0.9 MG/250ML-% IV SOLN
INTRAVENOUS | Status: AC
  Filled 2019-03-17: qty 250

## 2019-03-17 MED ORDER — DIPHENHYDRAMINE HCL 50 MG/ML IJ SOLN: 25.0000 mg | INTRAMUSCULAR | Status: DC | PRN

## 2019-03-17 MED ORDER — VASOPRESSIN 20 UNIT/ML IV SOLN
0.0300 [IU]/min | INTRAVENOUS | Status: DC
  Administered 2019-03-17: 0.03 [IU]/min via INTRAVENOUS
  Filled 2019-03-17: qty 2

## 2019-03-17 NOTE — Anesthesia Procedure Notes (Signed)
Procedure Name: Intubation Date/Time: 03/17/2019 9:00 PM Performed by: Inda Coke, CRNA Pre-anesthesia Checklist: Patient identified, Emergency Drugs available, Suction available and Patient being monitored Patient Re-evaluated:Patient Re-evaluated prior to induction Oxygen Delivery Method: Circle System Utilized Preoxygenation: Pre-oxygenation with 100% oxygen Induction Type: IV induction Ventilation: Mask ventilation without difficulty Laryngoscope Size: McGraph and 4 Grade View: Grade II Tube type: Oral Tube size: 7.5 mm Number of attempts: 1 Airway Equipment and Method: Stylet and Oral airway Placement Confirmation: ETT inserted through vocal cords under direct vision,  positive ETCO2 and breath sounds checked- equal and bilateral Secured at: 23 cm Tube secured with: Tape Dental Injury: Teeth and Oropharynx as per pre-operative assessment

## 2019-03-17 NOTE — Progress Notes (Signed)
Patient is not seen Noted he is still in the hospital Will cancel his outpatient appointment today and reschedule after he is discharged from the hospital

## 2019-03-17 NOTE — Progress Notes (Signed)
Physical Therapy Treatment Patient Details Name: [name] MRN: [id number] DOB: [birth year] Today's Date: 03/17/2019    History of Present Illness  64 y.o. male with medical history significant of HTN, laryngeal cancer tobacco and alcohol abuse, anxiety, carotid artery stenosis, remote hx of heart murmur, PAD, heart failure, CAD s/p CABG, cardiac arrest 2015. Presented (2/24) with  3 day of black vomit and black stools. admitted to hospital 2/24-2/27. Returned to ED 3/1 w/ c/o abdominal pain, n/v/d.    PT Comments    Pt seems to be doing well functionally, son arrived in room at end of session and reported that this is not pt's usual cognitive status. Pt initially seemed to be in good spirits but by end was quite down. He was able to get oob with  minimal assistance after set up able to stand from elevated bed with min guard assist and RW, from lower commode needed mod a. Ambulated in room with min guard assist, but needed assist with obstacles in walkers way or navigating in smaller spaces. Again son reports this is not normal processing or cognitive level.      Follow Up Recommendations  Home health PT     Equipment Recommendations  Rolling walker with 5" wheels    Recommendations for Other Services       Precautions / Restrictions Precautions Precautions: Fall Restrictions Weight Bearing Restrictions: No    Mobility  Bed Mobility Overal bed mobility: Needs Assistance Bed Mobility: Supine to Sit     Supine to sit: Min assist     General bed mobility comments: needs set up and line management  Transfers Overall transfer level: Needs assistance Equipment used: Rolling walker (2 wheeled) Transfers: Sit to/from Stand Sit to Stand: Mod assist         General transfer comment: stands with min guard assist from elevated bed but needs mod a for low commode  Ambulation/Gait Ambulation/Gait assistance: Min guard Gait Distance (Feet): 38 Feet Assistive device:  Rolling walker (2 wheeled) Gait Pattern/deviations: Step-through pattern;Decreased stride length Gait velocity: very slow   General Gait Details: ambulated in room only this day   Stairs             Wheelchair Mobility    Modified Rankin (Stroke Patients Only)       Balance Overall balance assessment: Needs assistance Sitting-balance support: Feet supported Sitting balance-Leahy Scale: Fair     Standing balance support: During functional activity;Bilateral upper extremity supported Standing balance-Leahy Scale: Fair                              Cognition Arousal/Alertness: Awake/alert Behavior During Therapy: Anxious Overall Cognitive Status: Impaired/Different from baseline Area of Impairment: Attention;Memory;Safety/judgement;Awareness;Problem solving                     Memory: Decreased recall of precautions   Safety/Judgement: Decreased awareness of safety;Decreased awareness of deficits   Problem Solving: Slow processing;Decreased initiation;Difficulty sequencing;Requires verbal cues General Comments: pt slow needing continued cues, son in room states this is not his normal self      Exercises      General Comments        Pertinent Vitals/Pain Pain Assessment: Faces Faces Pain Scale: Hurts even more Pain Location: w/ mobility Pain Descriptors / Indicators: Grimacing;Guarding Pain Intervention(s): Limited activity within patient's tolerance;Monitored during session    Home Living  Prior Function            PT Goals (current goals can now be found in the care plan section) Acute Rehab PT Goals Patient Stated Goal: pt seemed to be upset states he did not feel as if hed "make it" PT Goal Formulation: With patient/family Potential to Achieve Goals: Fair Progress towards PT goals: Progressing toward goals    Frequency    Min 3X/week      PT Plan Current plan remains appropriate     Co-evaluation              AM-PAC PT "6 Clicks" Mobility   Outcome Measure  Help needed turning from your back to your side while in a flat bed without using bedrails?: A Little Help needed moving from lying on your back to sitting on the side of a flat bed without using bedrails?: A Little Help needed moving to and from a bed to a chair (including a wheelchair)?: A Little Help needed standing up from a chair using your arms (e.g., wheelchair or bedside chair)?: A Little Help needed to walk in hospital room?: A Little Help needed climbing 3-5 steps with a railing? : A Lot 6 Click Score: 17    End of Session Equipment Utilized During Treatment: Gait belt Activity Tolerance: Patient limited by lethargy;Patient limited by pain Patient left: in chair;with call bell/phone within reach;with family/visitor present Nurse Communication: Mobility status PT Visit Diagnosis: Unsteadiness on feet (R26.81);Other abnormalities of gait and mobility (R26.89);Muscle weakness (generalized) (M62.81)     Time: [id number] PT Time Calculation (min) (ACUTE ONLY): 42 min  Charges:  $Gait Training: 8-22 mins $Therapeutic Activity: 23-37 mins                     Horald Chestnut, PT    Delford Field 03/17/2019, 1:17 PM

## 2019-03-17 NOTE — Progress Notes (Signed)
Pts daughter and son reporting that pt is not at his baseline, noted forgetfulness, agitation which are not his normal, MD notified of changes.

## 2019-03-17 NOTE — Progress Notes (Signed)
eLink Physician-Brief Progress Note Patient Name: [name] DOB: 1955/10/11 MRN: [id number]   Date of Service  03/17/2019  HPI/Events of Note  Received patient in ICU after cardiac arrest in different unit. Patient reportedly became suddenly tachycardic to 160s and then had PEA arrest.  In ICU, we supported his BP with pressors. He was noted to be hypoxemic initially so bedside bronchoscopy was performed which was most notable for blood regurgitating from esophagus suggestive of GI bleeding.   Furthermore, bedside abdominal ultrasound showed a large amount of complicated fluid with septations that may also be loculated intraperitoneal blood.   eICU Interventions  Transfuse ordered pRBCs.  Family has arrived and Dr. Gilford Raid is meeting with them to discuss goals of care in light of multiple sites of bleeding and critical illness in setting of known metastatic malignancy.         [name] [name] 03/17/2019, 10:24 PM

## 2019-03-17 NOTE — Consult Note (Signed)
NAME:  LEVELLE EDELEN, MRN:  [id number], DOB:  06-Nov-1955, LOS: 1 ADMISSION DATE:  03/25/2019, CONSULTATION DATE:  03/17/2019 REFERRING MD:  Code team, CHIEF COMPLAINT:  Cardiac arrest  Brief History   64 year old male with recent admission for UGIB and ablated gastric AVMs and recent imaging consistent with new metastatic liver disease admitted 3/1 for hypotension related to dehydration improving with hydration and H/H stable.  On the evening of 3/4 he had witnessed PEA arrest with ROSC after 26 mins.  Transferred to ICU hypoxic despite increased vent settings and hypotensive requiring multiple pressors.    History of present illness   HPI obtained from medical chart review as patient is encephalopathic.    64 year old male with prior history significant for but not limited to CAD s/p CABG, ischemic cardiomyopathy, HTN, HLD, carotid stents, laryngeal cancer status post ablation and recent admission 2/22 to 2/27 for upper GI bleed underwent upper endoscopy 225 found to have bleeding gastric AVM was ablated; ASA held. Also during admission CT noted for new metastatic liver disease which was to be followed up outpatient by oncology.   He had developed constipation in which he took miralax resulting in several episodes of dark diarrhea in addition to poor po intake.  He reported vomiting with attempts at eating.  Was readmitted to [facility] on 3/1 for orthostatic hypotension and dehydration for further evaluation and hydration. Initial H/H stable and no further episodes of vomiting. He was noted to have elevated troponin's in which Cardiology evaluated.  Patient had no complaints of cardiac symptoms and trend of troponin felt not to reflect ACS.  ASA was resumed on 3/3.  On 3/4 he had some intermittent confusion/ agitation however CT head was negative.  Later in the evening, patient had witnessed PEA cardiac arrest with RN at bedside.  ACLS measures performed, patient was intubated with noted clot like/ tissue, 5  epi and 2 bicarb with ROSC after 26 minutes.  He was transferred to ICU and PCCM consulted.  In ICU, he remained hypotensive requiring initiation of two vasopressors and ongoing hypoxia requiring 100% FiO2 and increased PEEP.   Past Medical History   Past Medical History:  Diagnosis Date  . Acute encephalopathy, improved 06/20/2013  . Acute MI, troponin > 20, no obvious culprit vessel 06/20/2013  . Anxiety   . Arthritis   . At risk for sudden cardiac death, has lifevest at discharge 06/20/2013  . Carotid stenosis   . Coronary artery disease    s/p CABG 2006; stenting to vein graft 05-2013; NSTEMI 06-2013 in setting of PEA arrest  . Depression   . Difficulty swallowing solids    Pt unable to swallow solid foods  . Dizziness 06/20/2013  . Dyslipidemia 10/09/2013   [place] Study atorvastatin -eIRB # [id number] tablet Take 40 mg by mouth daily.   . Fever, possible aspiration-treated with antibiotics 06/20/2013  . GERD (gastroesophageal reflux disease)   . Heart failure, acute on chronic, systolic and diastolic ([place]) 20/94/7096  . Heart murmur   . History of radiation therapy 09/05/14-10/23/14   larynx 70 Gy  . Hyperlipidemia   . Hypertension   . Hypothermia, induced, post arrest 06/20/2013  . Incisional hernia   . Ischemic cardiomyopathy    echo 06/2013 EF 10-15% (normal 01/2013)  . PAD (peripheral artery disease) ([place]) 10/09/2013   Occluded left common carotid and moderate right internal carotid artery stenosis. Previous stent to the left subclavian artery. Right iliac stent. Bilateral 60-99% renal  artery stenosis    . Radiation-induced esophageal stricture   . Shock liver 06/20/2013  . Shortness of breath dyspnea    with exertion  . 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 Subclavian artery stenosis, left ([facility])    s/p stenting 2006  . Tobacco abuse    >100 pack year history   . Weakness due to cardiac arrest 06/20/2013  . Wears glasses    Cardiac arrest 2015, former smoker and alcohol abuse  (last use reportedly 3 weeks ago)  Significant Hospital Events   2/24 to 2/27 GIB 3/1 Admitted 3/4 cardiac arrest   Consults:  Cardiology   Procedures:  3/4 ETT >> 3/4 bedside bronch post arrest >> normal  Significant Diagnostic Tests:  3/2 CT renal study Stable changes in the liver consistent with known hepatic metastatic disease. New bilateral pleural effusions with associated atelectatic changes.  Changes suggestive of anemia in the cardiac blood pool.  Postsurgical changes as described above  3/4 CTH >> negative   Micro Data:  3/2 SARS >> neg 3/3 stool cx  >> 3/3 cdiff >> neg 3/4 BAL  >>  Antimicrobials:  n/a  Interim history/subjective:  On vasopressin and neosynephrine at 300 mcg/min  Objective   Blood pressure 123/70, pulse (!) 131, temperature 98.2 F (36.8 C), temperature source Oral, resp. rate (!) 25, height _0  (1.702 m), weight 71.8 kg, SpO2 (!) 81 %.    Vent Mode: PRVC FiO2 (%):  [100 %] 100 % Set Rate:  [16 bmp] 16 bmp Vt Set:  [530 mL] 530 mL PEEP:  [8 cmH20] 8 cmH20 Plateau Pressure:  [30 cmH20] 30 cmH20   Intake/Output Summary (Last 24 hours) at 03/17/2019 2211 Last data filed at 03/17/2019 5456 Gross per 24 hour  Intake 100 ml  Output --  Net 100 ml   Filed Weights   03/15/19 0053 03/16/19 0100 03/16/19 0300  Weight: 73.3 kg 73.3 kg 71.8 kg    Examination: General:  Critically ill appearing male unresponsive on MV HEENT: MM pink/moist, pupils 4/sluggish, +corneals Neuro: unresponsive CV: rr, no murmur, weak peripheral pulses PULM:  MV supported breaths, diffuse rales/ rhonchi GI: distended, taunt, hypobs, ubag in place Extremities: cool/dry, +2-3 LE edema  Skin: no rashes   Resolved Hospital Problem list    Assessment & Plan:   PEA cardiac arrest, prolonged  Mixed respiratory and metabolic acidosis  Shock - multifactorial  Recent GIB, looks like re-occurrence given pooling of blood in posterior pharynx  Hx of Laryngeal cancer  with imaging consistent with new liver metastasis  Elevated troponin - evaluated by cardiology and felt trend not consistent with ACS - unclear etiology at this point, no significant lab abnormalities on am labs and H/H has been stable  - on PCCM arrival to room, emergent bronch performed by Dr. Gilford Raid with no signficant findings or bleeding.  Was noted to have recurrent pooling in posterior pharynx suggest of gastric bleeding.  Also noted to have increasing abd distention but no obvious pocket of fluid on bedside US, rather than loculated pockets of air.  Bedside POCUS echo by attending noted decreased EF - he has not received any sedation and remains unresponsive, high risk for anoxic neurological event given prolonged arrest  P:  After prolonged discussions with family (oldest son and daughter), family has decided to make patient DNR and once all family has arrived, will transition to comfort care.  Ongoing support provided to family.  We will continue current therapies till  family is ready to withdrawal.  Code status changed to DNR to reflect family discussions.    Labs   CBC: Recent Labs  Lab 04/05/2019 1254 03/25/2019 1254 03/15/19 0212 03/15/19 0401 03/16/19 1254 03/17/19 0405 03/17/19 2137  WBC 11.5*  --  11.3* 11.5* 17.1* 13.6*  --   HGB 10.7*   < > 10.6* 10.5* 11.5* 10.1* 11.2*  HCT 31.8*   < > 29.8* 30.4* 33.8* 29.3* 33.0*  MCV 91.4  --  87.9 88.9 90.4 89.6  --   PLT 275  --  244 258 248 221  --    < > = values in this interval not displayed.    Basic Metabolic Panel: Recent Labs  Lab 03/12/19 0231 03/12/19 0231 03/19/2019 1254 03/15/19 0212 03/16/19 1254 03/17/19 0405 03/17/19 2137  NA 133*   < > 131* 134* 135 137 143  K 3.9   < > 4.4 4.2 4.9 3.8 3.8  CL 97*  --  95* 101 103 105  --   CO2 26  --  24 23 18* 22  --   GLUCOSE 91  --  105* 95 101* 105*  --   BUN 8  --  _0 --   CREATININE 0.92  --  1.14 1.04 0.93 0.96  --   CALCIUM 8.1*  --  8.3* 8.0* 7.9*  7.9*  --   MG  --   --   --  2.0  --   --   --    < > = values in this interval not displayed.   GFR: Estimated Creatinine Clearance: 70.8 mL/min (by C-G formula based on SCr of 0.96 mg/dL). Recent Labs  Lab 03/15/19 0212 03/15/19 0401 03/15/19 0450 03/16/19 1254 03/17/19 0405  WBC 11.3* 11.5*  --  17.1* 13.6*  LATICACIDVEN 2.7*  --  1.8  --   --     Liver Function Tests: Recent Labs  Lab 03/11/19 0254 03/29/2019 1254  AST 313* 476*  ALT 151* 174*  ALKPHOS 581* 650*  BILITOT 2.0* 2.1*  PROT 5.1* 5.3*  ALBUMIN 2.2* 2.3*   Recent Labs  Lab 03/25/2019 1254  LIPASE 44   No results for input(s): AMMONIA in the last 168 hours.  ABG    Component Value Date/Time   PHART 7.102 (LL) 03/17/2019 2137   PCO2ART 52.7 (H) 03/17/2019 2137   PO2ART 65.0 (L) 03/17/2019 2137   HCO3 16.0 (L) 03/17/2019 2137   TCO2 17 (L) 03/17/2019 2137   ACIDBASEDEF 13.0 (H) 03/17/2019 2137   O2SAT 79.0 03/17/2019 2137     Coagulation Profile: No results for input(s): INR, PROTIME in the last 168 hours.  Cardiac Enzymes: No results for input(s): CKTOTAL, CKMB, CKMBINDEX, TROPONINI in the last 168 hours.  HbA1C: Hgb A1c MFr Bld  Date/Time Value Ref Range Status  04/17/2015 12:47 PM 5.7 (H) 4.8 - 5.6 % Final    Comment:    (NOTE)         Pre-diabetes: 5.7 - 6.4         Diabetes: >6.4         Glycemic control for adults with diabetes: <7.0   06/12/2013 04:27 AM 6.0 (H) <5.7 % Final    Comment:    (NOTE)  According to the ADA Clinical Practice Recommendations for 2011, when HbA1c is used as a screening test:  >=6.5%   Diagnostic of Diabetes Mellitus           (if abnormal result is confirmed) 5.7-6.4%   Increased risk of developing Diabetes Mellitus References:Diagnosis and Classification of Diabetes Mellitus,Diabetes XFGH,8299,37(JIRCV 1):S62-S69 and Standards of Medical Care in         Diabetes - 2011,Diabetes  ELFY,1017,51 (Suppl 1):S11-S61.    CBG: Recent Labs  Lab 03/17/19 2135 03/17/19 2147  GLUCAP 25* 174*    Review of Systems:   Unable  Past Medical History  He,  has a past medical history of Acute encephalopathy, improved (06/20/2013), Acute MI, troponin > 20, no obvious culprit vessel (06/20/2013), Anxiety, Arthritis, At risk for sudden cardiac death, has lifevest at discharge (06/20/2013), Carotid stenosis, Coronary artery disease, Depression, Difficulty swallowing solids, Dizziness (06/20/2013), Dyslipidemia (10/09/2013), Fever, possible aspiration-treated with antibiotics (06/20/2013), GERD (gastroesophageal reflux disease), Heart failure, acute on chronic, systolic and diastolic ([place]) (02/58/5277), Heart murmur, History of radiation therapy (09/05/14-10/23/14), Hyperlipidemia, Hypertension, Hypothermia, induced, post arrest (06/20/2013), Incisional hernia, Ischemic cardiomyopathy, PAD (peripheral artery disease) ([place]) (10/09/2013), Radiation-induced esophageal stricture, Shock liver (06/20/2013), Shortness of breath dyspnea, Squamous cell carcinoma of larynx ([place]) (07/14/2014), Subclavian artery stenosis, left ([place]), Tobacco abuse, Weakness due to cardiac arrest (06/20/2013), and Wears glasses.   Surgical History    Past Surgical History:  Procedure Laterality Date  . CARDIAC CATHETERIZATION    . CAROTID ANGIOGRAPHY  07/10/2016   Procedure: Carotid Angiography;  Surgeon: Lorretta Harp, MD;  Location: [place] CV LAB;  Service: Cardiovascular;;  Rt. Carotid  . CAROTID PTA/STENT INTERVENTION N/A 07/10/2016   Procedure: Carotid PTA/Stent Intervention;  Surgeon: Lorretta Harp, MD;  Location: [place] [facility] LAB;  Service: Cardiovascular;  Laterality: N/A;  Rt. External Carotid  . COLON SURGERY    . COLONOSCOPY    . COLOSTOMY    . COLOSTOMY TAKEDOWN  06/13/2015  . COLOSTOMY TAKEDOWN N/A 06/13/2015   Procedure: COLOSTOMY TAKEDOWN;  Surgeon: Coralie Keens, MD;  Location: [place];  Service: General;   Laterality: N/A;  . CORONARY ARTERY BYPASS GRAFT  09/2004   SVG to LAD (at [facility]  . DIRECT LARYNGOSCOPY N/A 07/06/2014   Procedure: DIRECT LARYNGOSCOPY AND ESOPAGOSCOPY WITH BIOPSY;  Surgeon: [name], MD;  Location: [facility] OR;  Service: ENT;  Laterality: N/A;  . ESOPHAGOGASTRODUODENOSCOPY (EGD) WITH PROPOFOL N/A 03/10/2019   Procedure: ESOPHAGOGASTRODUODENOSCOPY (EGD) WITH PROPOFOL;  Surgeon: Carol Ada, MD;  Location: [place];  Service: Endoscopy;  Laterality: N/A;  . ESOPHAGOSCOPY WITH DILITATION N/A 10/31/2015   Procedure: ESOPHAGOSCOPY WITH DILITATION;  Surgeon: [name], MD;  Location: [place];  Service: ENT;  Laterality: N/A;  . HOT HEMOSTASIS N/A 03/10/2019   Procedure: HOT HEMOSTASIS (ARGON PLASMA COAGULATION/BICAP);  Surgeon: Carol Ada, MD;  Location: [place];  Service: Endoscopy;  Laterality: N/A;  . ILIAC ARTERY STENT  07/24/2008   8x6 Smart nitinol self-expanding stent to origin of ilaci down into external iliac crossing the hypogastric artery (Dr. Adora Fridge)  . [place] REPAIR  06/13/2015  . LAPAROTOMY N/A 08/05/2014   Procedure: EXPLORATORY LAPAROTOMY WITH SIGMOIDOTOMY WITH COLECTOMY & COLOSTOMY;  Surgeon: Coralie Keens, MD;  Location: [place];  Service: General;  Laterality: N/A;  . LEFT HEART CATHETERIZATION WITH CORONARY/GRAFT ANGIOGRAM N/A 06/15/2013   Procedure: LEFT HEART CATHETERIZATION WITH Beatrix Fetters;  Surgeon: Sinclair Grooms, MD;  Location: [facility] CATH LAB;  Service: Cardiovascular;  Laterality: N/A;  . LOWER EXTREMITY ARTERIAL DOPPLER  08/2008   right and left ABI - mild arterial insufficiency at rest; R CIA/stent with 50-69% narrowing, L CIA with increased velocities (50-69% diameter reduction)  . NM MYOCAR PERF WALL MOTION  06/2008   persantine myoview - normal pattern of systolic thickening/wall motion/perfusion;  . PEG TUBE REMOVAL    . PERIPHERAL VASCULAR CATHETERIZATION  05/2013   stent to SVG - LAD vein graft at [facility]  . PERIPHERAL  VASCULAR CATHETERIZATION N/A 05/08/2015   Procedure: Aortic Arch Angiography;  Surgeon: Serafina Mitchell, MD;  Location: [place] CV LAB;  Service: Cardiovascular;  Laterality: N/A;  . PERIPHERAL VASCULAR CATHETERIZATION N/A 05/08/2015   Procedure: Carotid Angiography;  Surgeon: Serafina Mitchell, MD;  Location: [place] CV LAB;  Service: Cardiovascular;  Laterality: N/A;  . PERIPHERAL VASCULAR CATHETERIZATION  06/13/2015   Procedure: PORTA CATH REMOVAL;  Surgeon: Coralie Keens, MD;  Location: [place];  Service: General;;  . PORT-A-CATH REMOVAL  06/13/2015  . RENAL ARTERY DOPPLER  05/2008   right and left prox renal arteries with narrowing and increased velocities (60-99% diameter reduction)  . SUBCLAVIAN ANGIOGRAM Left 08/20/2004   8x76m Genesis balloon mounted stent     Social History   reports that he quit smoking about 5 years ago. His smoking use included cigarettes. He has a 180.00 pack-year smoking history. He has quit using smokeless tobacco. He reports current alcohol use of about 21.0 standard drinks of alcohol per week. He reports that he does not use drugs.   Family History   His family history includes Cancer in his father; Heart Problems in his sister; Heart attack in his mother; Heart disease in his maternal aunt; Stroke in his mother and sister.   Allergies Allergies  Allergen Reactions  . Cetuximab Anaphylaxis    Cardiac arrest 08/02/2014     Home Medications  Prior to Admission medications   Medication Sig Start Date End Date Taking? Authorizing Provider  ALPRAZolam (Duanne Moron 0.5 MG tablet Place 0.5 mg into feeding tube 2 (two) times daily.    Yes [provider]  aspirin 81 MG EC tablet Take 1 tablet (81 mg total) by mouth daily. Resume on 03/15/2019 03/15/19  Yes Elgergawy, DSilver Huguenin MD  buPROPion (WELLBUTRIN) 100 MG tablet Place 100 mg into feeding tube 3 (three) times daily.    Yes [provider]  carvedilol (COREG) 6.25 MG tablet Place 6.25 mg into  feeding tube 2 (two) times daily with a meal.   Yes [provider]  famotidine (PEPCID) 10 MG tablet Place 20 mg into feeding tube daily.   Yes [provider]  pantoprazole (PROTONIX) 40 MG tablet Take 1 tablet (40 mg total) by mouth daily. 03/12/19 04/11/19 Yes Elgergawy, DSilver Huguenin MD  Vitamins/Minerals TABS Take 1 tablet by mouth daily.   Yes [provider]  carvedilol (COREG) 6.25 MG tablet TAKE 1 TABLET(6.25 MG) BY MOUTH TWICE DAILY WITH A MEAL Patient not taking: No sig reported 07/02/16   CSanda Klein MD     Critical care time: 525mins    BKennieth Rad MSN, AGACNP-BC [facility] 03/17/2019, 11:13 PM

## 2019-03-17 NOTE — Progress Notes (Signed)
Pt transported while being bagged from 6E05 to [id number] with no complications.

## 2019-03-17 NOTE — Code Documentation (Addendum)
  Patient Name: [name]   MRN: [id number]   Date of Birth/ Sex: August 21, 1955 , male      Admission Date: 04/09/2019  Attending Provider: Darliss Cheney, MD  Primary Diagnosis: Hypotension   Indication: Pt was in his usual state of health until this PM, when he was noted to be unresponsive but still had a palpable pulse. HR then went from 120s to 40s and patient became unresponsive again with agonal breathing. Found to be pulseless and in PEA at which time CPR was initiated.   Technical Description:  - CPR performance duration:  22 minutes  - Was defibrillation or cardioversion used? No   - Was external pacer placed? No  - Was patient intubated pre/post CPR? Yes   Medications Administered: Y = Yes; Blank = No Amiodarone    Atropine    Calcium    Epinephrine  5  Lidocaine    Magnesium    Norepinephrine    Phenylephrine    Sodium bicarbonate  2  Vasopressin     Post CPR evaluation:  - Final Status - Was patient successfully resuscitated ? Yes - What is current rhythm? Sinus tach - What is current hemodynamic status? Unstable  Miscellaneous Information:  - Labs sent, including: ABG, CBC, CMP, lactic acid  - Primary team notified?  Yes  - Family Notified? Yes  - Additional notes/ transfer status:  Transferred from [place] to [place], [place], MD  03/17/2019, 10:01 PM

## 2019-03-17 NOTE — Code Documentation (Signed)
Upon entering unit for rounds, Code Blue alarm begin sounding then stopped. RRT entered pt room to evaluate.  Bedside RN in room and said pt was talking to her and went unresponsive-code blue alarm was pulled but cancelled because bedside RN was able to palpate pulse. Pt's HR then went from 120s to 40s. Per RRT assessment, pt was unresponsive, agonal, and gray. Pulse checked and pt then found to be pulseless>PEA. Code Blue was initiated at 2043 and CPR started immediately.  Pt received Epi X 5 and Bicarb x 2. Pt was intubated at 2055 by CRNA and bright red blood began coming out of ETT. ROSC established at 2059, HR-160s, SBP-87. Levophed gtt mixed by pharmacy but never started d/t BP increasing to 115/70. Pt transferred to 2H15 with bedside RN, RT, RR RN, and MD.

## 2019-03-17 NOTE — Progress Notes (Signed)
PROGRESS NOTE    [name]  [id number] DOB: August 01, 1955 DOA: 03/25/2019 PCP: [name], NP   Brief Narrative:  HPI: [name] is a 64 y.o. male with history of CAD status post CABG, carotid stents, cardiac arrest, recently admitted for GI bleed discharged on March 11, 2018 about 4 days ago since discharge has been having some constipation tried MiraLAX following which patient started having 3-4 episodes diarrhea and has been having poor appetite and unable to eat since patient tries to eat patient has vomiting.  Diarrhea was dark stools.  Given the symptoms patient presents to the ER.  During the last day patient had gastric AVMs which were ablated.  ED Course: In the ER patient's labs show hemoglobin 10.7 elevated LFTs which were elevated during recent admission.  High sensitive troponin was 630 and 557 EKG shows diffuse ST depression which are comparable to the recent EKG done on March 10, 2019.  Patient denies any chest pain or shortness of breath.  Chest x-ray unremarkable Covid test was negative.  Patient admitted for further observation.  Assessment & Plan:   Principal Problem:   Hypotension Active Problems:   Anemia in chronic illness   Ischemic cardiomyopathy, EF 45%   PAD (peripheral artery disease) (HCC)   Troponin level elevated   Elevated LFTs   Diarrhea   Weakness generalized   #1.  Hypotension/generalized weakness.  Likely related to volume loss the setting of diarrhea and emesis with decreased oral intake.Marland Kitchen  Has a history of hypertension and home medications include Coreg.  Quite orthostatic in the emergency department.  Blood pressure better but continues to feel weak.  We will continue IV fluids.  #2.  Elevated troponin.  Patient with a history of ischemic cardiomyopathy/CAD status post CABG and stenting.  Not currently on aspirin due to recent GI bleed.  Not currently on a statin due to transaminitis/metastatic liver disease. denies  chest pain.  Evaluated by cardiology who opine his troponins minimally elevated with no clear trend not likely representing acute coronary syndrome recommend no further ischemia evaluation.  Patient denies chest pain. -Resume aspirin.  #3.  Recent GI bleed.  Has had gastric issues which were ablated.  Hemoglobin stable.  Home medications include Protonix.  Denies any nausea or emesis since admission.  Tolerating heart healthy diet.continue aspirin hemoglobin is stable.  #4.  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  Last hospitalization had elevated LFTs.  Follow-up with oncology as outpatient.  #5.  Diarrhea.  Patient took [place] prior to admission for constipation.  He had multiple watery stools prior to presentation as noted in HPI.  Patient's last dose of MiraLAX was 5 days ago but he continues to have persistent watery bowel movements, he has had approximately 5 bowel meds in last 24 hours.  C. difficile negative.  Stool ova, parasite and GI pathogen still pending.  We will try Imodium as needed.  #6.  Agitation/acute encephalopathy: When I saw this patient earlier at around 10 AM, he was on the commode, completely alert and oriented however I received a page letter by the nurse that patient was having some episodes of forgetfulness and reportedly he was agitated and was easily irritable with the family and per family, this is not normal for him.  Patient did not have any neurological deficit on my exam and per nurse, he does not have any neurological deficit now either however we will proceed with CT head without contrast stat to  rule out any intracranial pathology.  DVT prophylaxis: SCD due to recent GI bleed Code Status: Code Family Communication: No family was present at the bedside today.  Plan of care was discussed with patient. Patient is from: Home Disposition Plan: Home with home health Barriers to discharge: Persistent diarrhea requiring IV fluids and  now acute encephalopathy needing further work-up   Estimated body mass index is 24.79 kg/m as calculated from the following:   Height as of this encounter: 5\' 7"  (1.702 m).   Weight as of this encounter: 71.8 kg.      Nutritional status:  Nutrition Problem: Increased nutrient needs Etiology: chronic illness(history of laryngeal cancer)   Signs/Symptoms: estimated needs   Interventions: MVI    Consultants:  None Procedures:   None  Antimicrobials:   None   Subjective: Seen and examined earlier.  Only complaint was some pain and pressure in the left ankle due to edema.  He also has had 5 bowel movements in last 24 hours, mostly watery.  Objective: Vitals:   03/16/19 1638 03/16/19 2000 03/16/19 2122 03/17/19 0542  BP: 124/83  125/82 119/80  Pulse: (!) 110  (!) 110 (!) 109  Resp: (!) 21 18 15  (!) 25  Temp:   98.2 F (36.8 C) 98.2 F (36.8 C)  TempSrc:   Oral Oral  SpO2:   95% (!) 88%  Weight:      Height:        Intake/Output Summary (Last 24 hours) at 03/17/2019 1231 Last data filed at 03/16/2019 2042 Gross per 24 hour  Intake 597 ml  Output --  Net 597 ml   Filed Weights   03/15/19 0053 03/16/19 0100 03/16/19 0300  Weight: 73.3 kg 73.3 kg 71.8 kg    Examination:  General exam: Appears calm and comfortable  Respiratory system: Clear to auscultation. Respiratory effort normal. Cardiovascular system: S1 & S2 heard, RRR. No JVD, murmurs, rubs, gallops or clicks. No pedal edema. Gastrointestinal system: Abdomen is nondistended, soft and minimal epigastric tenderness. No organomegaly or masses felt. Normal bowel sounds heard. Central nervous system: Alert and oriented. No focal neurological deficits. Extremities: Symmetric 5 x 5 power. Skin: No rashes, lesions or ulcers.  Psychiatry: Judgement and insight appear normal. Mood & affect appropriate.   Data Reviewed: I have personally reviewed following labs and imaging studies  CBC: Recent Labs  Lab  04/10/2019 1254 03/15/19 0212 03/15/19 0401 03/16/19 1254 03/17/19 0405  WBC 11.5* 11.3* 11.5* 17.1* 13.6*  HGB 10.7* 10.6* 10.5* 11.5* 10.1*  HCT 31.8* 29.8* 30.4* 33.8* 29.3*  MCV 91.4 87.9 88.9 90.4 89.6  PLT 275 244 258 248 [id number]   Basic Metabolic Panel: Recent Labs  Lab 03/12/19 0231 04/06/2019 1254 03/15/19 0212 03/16/19 1254 03/17/19 0405  NA 133* 131* 134* 135 137  K 3.9 4.4 4.2 4.9 3.8  CL 97* 95* 101 103 105  CO2 26 24 23  18* 22  GLUCOSE 91 105* 95 101* 105*  BUN 8 10 12 11 9   CREATININE 0.92 1.14 1.04 0.93 0.96  CALCIUM 8.1* 8.3* 8.0* 7.9* 7.9*  MG  --   --  2.0  --   --    GFR: Estimated Creatinine Clearance: 70.8 mL/min (by C-G formula based on SCr of 0.96 mg/dL). Liver Function Tests: Recent Labs  Lab 03/11/19 0254 03/16/2019 1254  AST 313* 476*  ALT 151* 174*  ALKPHOS 581* 650*  BILITOT 2.0* 2.1*  PROT 5.1* 5.3*  ALBUMIN 2.2* 2.3*   Recent Labs  Lab 04/12/2019 1254  LIPASE 44   No results for input(s): AMMONIA in the last 168 hours. Coagulation Profile: No results for input(s): INR, PROTIME in the last 168 hours. Cardiac Enzymes: No results for input(s): CKTOTAL, CKMB, CKMBINDEX, TROPONINI in the last 168 hours. BNP (last 3 results) No results for input(s): PROBNP in the last 8760 hours. HbA1C: No results for input(s): HGBA1C in the last 72 hours. CBG: No results for input(s): GLUCAP in the last 168 hours. Lipid Profile: No results for input(s): CHOL, HDL, LDLCALC, TRIG, CHOLHDL, LDLDIRECT in the last 72 hours. Thyroid Function Tests: Recent Labs    03/15/19 0212 03/15/19 1113  TSH 11.066*  --   FREET4  --  0.92   Anemia Panel: No results for input(s): VITAMINB12, FOLATE, FERRITIN, TIBC, IRON, RETICCTPCT in the last 72 hours. Sepsis Labs: Recent Labs  Lab 03/15/19 0212 03/15/19 0450  LATICACIDVEN 2.7* 1.8    Recent Results (from the past 240 hour(s))  SARS CORONAVIRUS 2 (TAT 6-24 HRS) Nasopharyngeal Nasopharyngeal Swab     Status:  None   Collection Time: 03/09/19  1:33 PM   Specimen: Nasopharyngeal Swab  Result Value Ref Range Status   SARS Coronavirus 2 NEGATIVE NEGATIVE Final    Comment: (NOTE) SARS-CoV-2 target nucleic acids are NOT DETECTED. The SARS-CoV-2 RNA is generally detectable in upper and lower respiratory specimens during the acute phase of infection. Negative results do not preclude SARS-CoV-2 infection, do not rule out co-infections with other pathogens, and should not be used as the sole basis for treatment or other patient management decisions. Negative results must be combined with clinical observations, patient history, and epidemiological information. The expected result is Negative. Fact Sheet for Patients: SugarRoll.be Fact Sheet for Healthcare Providers: https://www.woods-mathews.com/ This test is not yet approved or cleared by the [place] FDA and  has been authorized for detection and/or diagnosis of SARS-CoV-2 by FDA under an Emergency Use Authorization (EUA). This EUA will remain  in effect (meaning this test can be used) for the duration of the COVID-19 declaration under Section 56 4(b)(1) of the Act, 21 U.S.C. section 360bbb-3(b)(1), unless the authorization is terminated or revoked sooner. Performed at [facility], [place] [address], [place], [place] [address]   SARS CORONAVIRUS 2 (TAT 6-24 HRS) Nasopharyngeal Nasopharyngeal Swab     Status: None   Collection Time: 03/15/19 12:09 AM   Specimen: Nasopharyngeal Swab  Result Value Ref Range Status   SARS Coronavirus 2 NEGATIVE NEGATIVE Final    Comment: (NOTE) SARS-CoV-2 target nucleic acids are NOT DETECTED. The SARS-CoV-2 RNA is generally detectable in upper and lower respiratory specimens during the acute phase of infection. Negative results do not preclude SARS-CoV-2 infection, do not rule out co-infections with other pathogens, and should not be used as the sole basis for  treatment or other patient management decisions. Negative results must be combined with clinical observations, patient history, and epidemiological information. The expected result is Negative. Fact Sheet for Patients: SugarRoll.be Fact Sheet for Healthcare Providers: https://www.woods-mathews.com/ This test is not yet approved or cleared by the [place] FDA and  has been authorized for detection and/or diagnosis of SARS-CoV-2 by FDA under an Emergency Use Authorization (EUA). This EUA will remain  in effect (meaning this test can be used) for the duration of the COVID-19 declaration under Section 56 4(b)(1) of the Act, 21 U.S.C. section 360bbb-3(b)(1), unless the authorization is terminated or revoked sooner. Performed at [facility], [place],  [place] [address]   C difficile quick scan w PCR reflex     Status: None   Collection Time: 03/16/19  6:10 PM   Specimen: STOOL  Result Value Ref Range Status   C Diff antigen NEGATIVE NEGATIVE Final   C Diff toxin NEGATIVE NEGATIVE Final   C Diff interpretation No C. difficile detected.  Final    Comment: Performed at [facility], [place] [address], [place], [place] [address]      Radiology Studies: No results found.  Scheduled Meds: . ALPRAZolam  0.5 mg Oral BID  . aspirin EC  81 mg Oral Daily  . buPROPion  100 mg Oral TID  . feeding supplement (ENSURE ENLIVE)  237 mL Oral TID BM  . multivitamin with minerals  1 tablet Oral Daily  . pantoprazole  40 mg Oral BID  . sodium chloride flush  3 mL Intravenous Once   Continuous Infusions: . sodium chloride 100 mL/hr at 03/17/19 0247     LOS: 1 day   Time spent: 30 minutes   Darliss Cheney, MD [facility]  03/17/2019, 12:31 PM   To contact the attending provider between 7A-7P or the covering provider during after hours 7P-7A, please log into the web site [email or web address].

## 2019-03-17 NOTE — Progress Notes (Signed)
In the pt's room, while talking & taking V/S when suddenly pt's eyes rolled up, unresponsive & agonal. Code blue alarm pulled. Mindy RN rapid response came. Pt's HR noted from 120's down to 40's. Pt went into PEA, pulseless, & gray. CPR started immediately. Code blue initiated with the help of rapid response RN. Pt received multiple meds (Pls sees  CPR record) & intubated. Family was informed by MD. ROSC established @ 20:59. BP=115/70. Pt transferred to [place]. Report given to Apolonio Schneiders RN at bedside.

## 2019-03-18 DIAGNOSIS — Z978 Presence of other specified devices: Secondary | ICD-10-CM

## 2019-03-18 LAB — OVA + PARASITE EXAM

## 2019-03-18 LAB — O&P RESULT

## 2019-03-18 MED FILL — Medication: Qty: 1 | Status: AC

## 2019-03-20 LAB — STOOL CULTURE REFLEX - CMPCXR

## 2019-03-20 LAB — STOOL CULTURE: E coli, Shiga toxin Assay: NEGATIVE

## 2019-03-20 LAB — CULTURE, BAL-QUANTITATIVE W GRAM STAIN: Culture: >=100000 — AB

## 2019-03-20 LAB — STOOL CULTURE REFLEX - RSASHR

## 2019-04-14 NOTE — Progress Notes (Signed)
Wasted 11mL of Morphine in [facility] w/ August Luz, RN.  Returned  2 vials of 2mg /mL Ativan to pyxis as well.

## 2019-04-14 NOTE — Progress Notes (Signed)
Patient extubated per MD order by respiratory therapy. Morphine bolus and gtt initiated per MD order. Patient's next of kin at bedside. Belongings given to son, Anton. Chaplain at bedside providing spiritual care.

## 2019-04-14 NOTE — Progress Notes (Signed)
Patient pronounced dead on Apr 12, 2019 at [facility] by Rolly Pancake, RN and Garvin, RN via no heart sounds to auscultation and patient in asystole on the cardiac monitor. Asystole EKG strip for April 12, 2019 0009 printed and placed in patient chart.   [place] notified of possible patient expiration at [place] on April 12, 2019. Spoke with Bethena Roys. At [place] was contacted again by this RN to make the representative aware of patient death. Patient is being considered for eye tissue donation. Family is undecided about donation at this time. Funeral home was picked out by the patient, but the family is uncertain of the name of the funeral home.

## 2019-04-14 NOTE — Procedures (Signed)
Bronchoscopy Procedure Note [name] [id number] [birth year]  Procedure: Bronchoscopy Indications: Diagnostic evaluation of the airways, Obtain specimens for culture and/or other diagnostic studies and Remove secretions  Procedure Details Consent: Unable to obtain consent because of emergent medical necessity. Time Out: Verified patient identification, verified procedure, site/side was marked, verified correct patient position, special equipment/implants available, medications/allergies/relevent history reviewed, required imaging and test results available.  Performed  In preparation for procedure, patient was given 100% FiO2 and bronchoscope lubricated. Sedation: not requiring sedation post arrest  Airway entered and the following bronchi were examined: RUL, RML, RLL, LUL and LLL.   Procedures performed: Brushings performed Bronchoscope removed.  , Patient placed back on 100% FiO2 at conclusion of procedure.    Findings:  blood tinged secretions easily suctioned with no re-pooling. BAL performed in LLL with modest return of aliquot.   Evaluation Hemodynamic Status: BP stable throughout; O2 sats: stable throughout Patient's Current Condition: on vasopressors post code prior to bronch Specimens:  Sent serosanguinous fluid Complications: No apparent complications Patient did tolerate procedure well.   Rise Paganini [name] 2019-03-30

## 2019-04-14 NOTE — Discharge Summary (Signed)
Death Summary  [name] [id number] DOB: 1955-07-05 DOA: 04/11/2019  PCP: [name], NP PCP/Office notified:   Admit date: March 18, 2019 Date of Death: Mar 22, 2019  Final Diagnoses:  Principal Problem:   Hypotension Active Problems:   Acute respiratory failure ([place])   Anemia in chronic illness   Ischemic cardiomyopathy, EF 45%   PAD (peripheral artery disease) (HCC)   Troponin level elevated   Aspiration into airway   Elevated LFTs   Diarrhea   Weakness generalized   Cardiac arrest, cause unspecified ([place])   Endotracheally intubated    1. Hypotension/lysed weakness 2. Elevated troponin 3. Cardiopulmonary arrest 4. Recent GI bleed 5. 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 We will encephalopathy  History of present illness:  [name] is a 64 y.o. male with history of CAD status post CABG, carotid stents, cardiac arrest, recently admitted for GI bleed discharged on March 11, 2018 about 4 days ago since discharge has been having some constipation tried MiraLAX following which patient started having 3-4 episodes diarrhea and has been having poor appetite and unable to eat since patient tries to eat patient has vomiting.  Diarrhea was dark stools.  Given the symptoms patient presents to the ER.  During the last day patient had gastric AVMs which were ablated.  Hospital Course:  Patient was admitted due to generalized weakness, diarrhea, vomiting and abdominal discomfort.  He also had slightly elevated troponin and history of CAD as well as recent history of GI bleed.  Also had hypotension.  His hypotension and generalized weakness was thought to be secondary to diarrhea.  Due to abdominal pain, nausea and vomiting, CT renal stone study was done which showed stable changes in liver consistent with known hepatic metastatic disease.  Minimal new bilateral pleural effusions.  No other acute changes.  Patient was tested negative for  C. difficile.  Stool ova, parasite and GI pathogen were ordered but the were pending and were not collected since patient's diarrhea improved.  Due to elevated troponin, cardiology was consulted who opined that his troponins were minimally elevated with no clear trend so not likely representing acute coronary syndrome and recommended no further ischemic evaluation. During this hospitalization, patient had episodes of agitation and acute metabolic encephalopathy.  CT head was obtained which was unremarkable.  When I saw patient for the first time on the morning of 03/16/2019, patient was completely alert and oriented.  He did not have any neurological deficit on exam.  Subsequently on the night of 03/17/2019, patient suddenly became tachycardic with heart rate up to 160s and then had PEA arrest.  CODE BLUE was called.  Patient's heart rate was noted to fluctuate from 40-1 20.  Patient received multiple meds and was intubated.  Family was informed by night MD and ROSC established at 20:59.  Patient was then transferred to ICU.  Vasopressors were started to support his blood pressure.  A bedside bronchoscopy was performed which was most notable for blood regurgitating from esophagus suggestive of GI bleeding.  Bedside abdominal ultrasound showed a large amount of complicated fluid with septations that may also be loculated intraperitoneal blood.  Transfusion was ordered.  After having a long discussion between night ICU MD and family and due to poor prognosis due to likely having multiple bleeding sites and a history of metastatic malignancy, family opted for comfort care., patient was then subsequently extubated soon after that per recommendations from ICU MD.  Patient was pronounced dead on March 22, 2019  at [facility].  Please note that details of CODE BLUE and the events after that are not known to me as that happened overnight while I was off duty.   Time: 20 minutes  Signed:  Darliss Cheney  [facility] 04/13/2019,  4:06 PM

## 2019-04-14 NOTE — Progress Notes (Signed)
The chaplain originally responded to the code blue page on [place]. The chaplain later responded to a page to be with the family on [place]. The family had made the decision to transition to comfort care and wanted prayer for their loved one. The chaplain prayed for the family and the patient. The chaplain is available if further support is needed.  Brion Aliment Chaplain Resident For questions concerning this note please contact me by pager [phone]

## 2019-04-14 DEATH — deceased

## 2019-04-15 ENCOUNTER — Other Ambulatory Visit: Payer: Self-pay | Admitting: Vascular Surgery

## 2019-06-21 ENCOUNTER — Ambulatory Visit: Payer: Self-pay | Admitting: Cardiovascular Disease

## 2020-03-10 ENCOUNTER — Other Ambulatory Visit: Payer: Self-pay | Admitting: Cardiovascular Disease

## 2020-03-14 ENCOUNTER — Other Ambulatory Visit: Payer: Self-pay

## 2020-04-10 ENCOUNTER — Other Ambulatory Visit: Payer: Self-pay | Admitting: Cardiovascular Disease
